# Patient Record
Sex: Male | Born: 1950
Health system: Southern US, Community
[De-identification: ages and names within clinical notes are randomized; demographics above are authoritative.]

## PROBLEM LIST (undated history)

## (undated) DIAGNOSIS — T8859XA Other complications of anesthesia, initial encounter: Secondary | ICD-10-CM

## (undated) DIAGNOSIS — I10 Essential (primary) hypertension: Secondary | ICD-10-CM

## (undated) DIAGNOSIS — Z9109 Other allergy status, other than to drugs and biological substances: Secondary | ICD-10-CM

## (undated) DIAGNOSIS — J189 Pneumonia, unspecified organism: Secondary | ICD-10-CM

## (undated) DIAGNOSIS — IMO0002 Reserved for concepts with insufficient information to code with codable children: Secondary | ICD-10-CM

## (undated) DIAGNOSIS — T4145XA Adverse effect of unspecified anesthetic, initial encounter: Secondary | ICD-10-CM

## (undated) DIAGNOSIS — H9319 Tinnitus, unspecified ear: Secondary | ICD-10-CM

## (undated) DIAGNOSIS — R011 Cardiac murmur, unspecified: Secondary | ICD-10-CM

## (undated) DIAGNOSIS — C109 Malignant neoplasm of oropharynx, unspecified: Secondary | ICD-10-CM

## (undated) DIAGNOSIS — Z923 Personal history of irradiation: Secondary | ICD-10-CM

## (undated) DIAGNOSIS — K5790 Diverticulosis of intestine, part unspecified, without perforation or abscess without bleeding: Secondary | ICD-10-CM

## (undated) DIAGNOSIS — Z9221 Personal history of antineoplastic chemotherapy: Secondary | ICD-10-CM

## (undated) HISTORY — PX: COLONOSCOPY: SHX174

## (undated) HISTORY — PX: LUMBAR DISC SURGERY: SHX700

## (undated) HISTORY — DX: Reserved for concepts with insufficient information to code with codable children: IMO0002

## (undated) HISTORY — PX: TONSILLECTOMY: SUR1361

## (undated) HISTORY — DX: Diverticulosis of intestine, part unspecified, without perforation or abscess without bleeding: K57.90

## (undated) HISTORY — DX: Malignant neoplasm of oropharynx, unspecified: C10.9

## (undated) HISTORY — DX: Other allergy status, other than to drugs and biological substances: Z91.09

## (undated) HISTORY — PX: APPENDECTOMY: SHX54

## (undated) HISTORY — DX: Personal history of irradiation: Z92.3

---
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
## 1998-02-23 ENCOUNTER — Ambulatory Visit (HOSPITAL_COMMUNITY): Admission: RE | Admit: 1998-02-23 | Discharge: 1998-02-23 | Payer: Self-pay | Admitting: *Deleted

## 2005-06-07 ENCOUNTER — Emergency Department (HOSPITAL_COMMUNITY): Admission: EM | Admit: 2005-06-07 | Discharge: 2005-06-07 | Payer: Self-pay | Admitting: Emergency Medicine

## 2005-06-24 ENCOUNTER — Emergency Department (HOSPITAL_COMMUNITY): Admission: EM | Admit: 2005-06-24 | Discharge: 2005-06-24 | Payer: Self-pay | Admitting: Emergency Medicine

## 2006-11-18 DIAGNOSIS — K5790 Diverticulosis of intestine, part unspecified, without perforation or abscess without bleeding: Secondary | ICD-10-CM

## 2006-11-18 HISTORY — DX: Diverticulosis of intestine, part unspecified, without perforation or abscess without bleeding: K57.90

## 2007-08-30 ENCOUNTER — Emergency Department (HOSPITAL_COMMUNITY): Admission: EM | Admit: 2007-08-30 | Discharge: 2007-08-30 | Payer: Self-pay | Admitting: Family Medicine

## 2007-12-24 ENCOUNTER — Ambulatory Visit: Payer: Self-pay | Admitting: Gastroenterology

## 2008-01-07 ENCOUNTER — Ambulatory Visit: Payer: Self-pay | Admitting: Gastroenterology

## 2008-12-07 ENCOUNTER — Emergency Department (HOSPITAL_COMMUNITY): Admission: EM | Admit: 2008-12-07 | Discharge: 2008-12-07 | Payer: Self-pay | Admitting: Emergency Medicine

## 2009-11-24 ENCOUNTER — Ambulatory Visit: Payer: Self-pay | Admitting: Family Medicine

## 2010-02-19 ENCOUNTER — Ambulatory Visit: Payer: Self-pay | Admitting: Family Medicine

## 2010-08-15 ENCOUNTER — Ambulatory Visit: Payer: Self-pay | Admitting: Family Medicine

## 2010-09-18 ENCOUNTER — Ambulatory Visit: Payer: Self-pay | Admitting: Family Medicine

## 2010-09-18 ENCOUNTER — Ambulatory Visit (HOSPITAL_COMMUNITY): Admission: RE | Admit: 2010-09-18 | Discharge: 2010-09-18 | Payer: Self-pay | Admitting: Family Medicine

## 2010-09-18 DIAGNOSIS — Z85819 Personal history of malignant neoplasm of unspecified site of lip, oral cavity, and pharynx: Secondary | ICD-10-CM | POA: Insufficient documentation

## 2010-09-18 DIAGNOSIS — C109 Malignant neoplasm of oropharynx, unspecified: Secondary | ICD-10-CM

## 2010-09-18 HISTORY — DX: Malignant neoplasm of oropharynx, unspecified: C10.9

## 2010-09-25 ENCOUNTER — Encounter: Admission: RE | Admit: 2010-09-25 | Discharge: 2010-09-25 | Payer: Self-pay | Admitting: Otolaryngology

## 2010-09-25 ENCOUNTER — Other Ambulatory Visit: Admission: RE | Admit: 2010-09-25 | Discharge: 2010-09-25 | Payer: Self-pay | Admitting: Otolaryngology

## 2010-09-28 ENCOUNTER — Ambulatory Visit (HOSPITAL_COMMUNITY): Admission: RE | Admit: 2010-09-28 | Discharge: 2010-09-28 | Payer: Self-pay | Admitting: Otolaryngology

## 2010-10-01 ENCOUNTER — Ambulatory Visit (HOSPITAL_BASED_OUTPATIENT_CLINIC_OR_DEPARTMENT_OTHER): Admission: RE | Admit: 2010-10-01 | Discharge: 2010-10-01 | Payer: Self-pay | Admitting: Otolaryngology

## 2010-10-03 ENCOUNTER — Ambulatory Visit: Payer: Self-pay | Admitting: Oncology

## 2010-10-10 LAB — COMPREHENSIVE METABOLIC PANEL
ALT: 18 U/L (ref 0–53)
AST: 21 U/L (ref 0–37)
Albumin: 4 g/dL (ref 3.5–5.2)
Alkaline Phosphatase: 110 U/L (ref 39–117)
BUN: 9 mg/dL (ref 6–23)
CO2: 28 mEq/L (ref 19–32)
Calcium: 9.3 mg/dL (ref 8.4–10.5)
Chloride: 102 mEq/L (ref 96–112)
Creatinine, Ser: 0.95 mg/dL (ref 0.40–1.50)
Glucose, Bld: 117 mg/dL — ABNORMAL HIGH (ref 70–99)
Potassium: 3.9 mEq/L (ref 3.5–5.3)
Sodium: 138 mEq/L (ref 135–145)
Total Bilirubin: 0.6 mg/dL (ref 0.3–1.2)
Total Protein: 7.6 g/dL (ref 6.0–8.3)

## 2010-10-10 LAB — CBC WITH DIFFERENTIAL/PLATELET
BASO%: 0.3 % (ref 0.0–2.0)
Basophils Absolute: 0 10*3/uL (ref 0.0–0.1)
EOS%: 1.9 % (ref 0.0–7.0)
Eosinophils Absolute: 0.1 10*3/uL (ref 0.0–0.5)
HCT: 41.5 % (ref 38.4–49.9)
HGB: 14.1 g/dL (ref 13.0–17.1)
LYMPH%: 22.1 % (ref 14.0–49.0)
MCH: 29.1 pg (ref 27.2–33.4)
MCHC: 34 g/dL (ref 32.0–36.0)
MCV: 85.6 fL (ref 79.3–98.0)
MONO#: 0.5 10*3/uL (ref 0.1–0.9)
MONO%: 9.1 % (ref 0.0–14.0)
NEUT#: 4 10*3/uL (ref 1.5–6.5)
NEUT%: 66.6 % (ref 39.0–75.0)
Platelets: 238 10*3/uL (ref 140–400)
RBC: 4.84 10*6/uL (ref 4.20–5.82)
RDW: 13.8 % (ref 11.0–14.6)
WBC: 6 10*3/uL (ref 4.0–10.3)
lymph#: 1.3 10*3/uL (ref 0.9–3.3)

## 2010-10-17 ENCOUNTER — Encounter: Admission: RE | Admit: 2010-10-17 | Discharge: 2010-10-17 | Payer: Self-pay | Admitting: Dentistry

## 2010-10-17 ENCOUNTER — Ambulatory Visit: Payer: Self-pay | Admitting: Dentistry

## 2010-10-23 ENCOUNTER — Ambulatory Visit
Admission: RE | Admit: 2010-10-23 | Discharge: 2010-12-18 | Payer: Self-pay | Source: Home / Self Care | Attending: Radiation Oncology | Admitting: Radiation Oncology

## 2010-10-26 ENCOUNTER — Encounter
Admission: RE | Admit: 2010-10-26 | Discharge: 2010-11-15 | Payer: Self-pay | Source: Home / Self Care | Attending: Oncology | Admitting: Oncology

## 2010-10-31 LAB — COMPREHENSIVE METABOLIC PANEL
ALT: 23 U/L (ref 0–53)
AST: 24 U/L (ref 0–37)
Albumin: 4 g/dL (ref 3.5–5.2)
Alkaline Phosphatase: 130 U/L — ABNORMAL HIGH (ref 39–117)
BUN: 12 mg/dL (ref 6–23)
CO2: 29 mEq/L (ref 19–32)
Calcium: 9.7 mg/dL (ref 8.4–10.5)
Chloride: 100 mEq/L (ref 96–112)
Creatinine, Ser: 0.87 mg/dL (ref 0.40–1.50)
Glucose, Bld: 115 mg/dL — ABNORMAL HIGH (ref 70–99)
Potassium: 4.5 mEq/L (ref 3.5–5.3)
Sodium: 138 mEq/L (ref 135–145)
Total Bilirubin: 0.8 mg/dL (ref 0.3–1.2)
Total Protein: 7.2 g/dL (ref 6.0–8.3)

## 2010-10-31 LAB — CBC WITH DIFFERENTIAL/PLATELET
BASO%: 0.2 % (ref 0.0–2.0)
Basophils Absolute: 0 10*3/uL (ref 0.0–0.1)
EOS%: 1.6 % (ref 0.0–7.0)
Eosinophils Absolute: 0.1 10*3/uL (ref 0.0–0.5)
HCT: 42.1 % (ref 38.4–49.9)
HGB: 14.5 g/dL (ref 13.0–17.1)
LYMPH%: 22.2 % (ref 14.0–49.0)
MCH: 29.5 pg (ref 27.2–33.4)
MCHC: 34.5 g/dL (ref 32.0–36.0)
MCV: 85.7 fL (ref 79.3–98.0)
MONO#: 0.5 10*3/uL (ref 0.1–0.9)
MONO%: 8 % (ref 0.0–14.0)
NEUT#: 4.2 10*3/uL (ref 1.5–6.5)
NEUT%: 68 % (ref 39.0–75.0)
Platelets: 226 10*3/uL (ref 140–400)
RBC: 4.91 10*6/uL (ref 4.20–5.82)
RDW: 13.7 % (ref 11.0–14.6)
WBC: 6.1 10*3/uL (ref 4.0–10.3)
lymph#: 1.4 10*3/uL (ref 0.9–3.3)

## 2010-11-02 ENCOUNTER — Ambulatory Visit: Payer: Self-pay | Admitting: Oncology

## 2010-11-13 ENCOUNTER — Ambulatory Visit (HOSPITAL_COMMUNITY)
Admission: RE | Admit: 2010-11-13 | Discharge: 2010-11-13 | Payer: Self-pay | Source: Home / Self Care | Attending: Radiation Oncology | Admitting: Radiation Oncology

## 2010-11-15 ENCOUNTER — Encounter
Admission: RE | Admit: 2010-11-15 | Discharge: 2010-12-18 | Payer: Self-pay | Source: Home / Self Care | Attending: Oncology | Admitting: Oncology

## 2010-11-15 LAB — BASIC METABOLIC PANEL
BUN: 27 mg/dL — ABNORMAL HIGH (ref 6–23)
CO2: 25 mEq/L (ref 19–32)
Calcium: 9.4 mg/dL (ref 8.4–10.5)
Chloride: 85 mEq/L — ABNORMAL LOW (ref 96–112)
Creatinine, Ser: 1.51 mg/dL — ABNORMAL HIGH (ref 0.40–1.50)
Glucose, Bld: 105 mg/dL — ABNORMAL HIGH (ref 70–99)
Potassium: 4.6 mEq/L (ref 3.5–5.3)
Sodium: 123 mEq/L — ABNORMAL LOW (ref 135–145)

## 2010-11-15 LAB — MAGNESIUM: Magnesium: 1.9 mg/dL (ref 1.5–2.5)

## 2010-11-26 LAB — CBC WITH DIFFERENTIAL/PLATELET
BASO%: 0.3 % (ref 0.0–2.0)
Basophils Absolute: 0 10*3/uL (ref 0.0–0.1)
EOS%: 2.2 % (ref 0.0–7.0)
Eosinophils Absolute: 0 10*3/uL (ref 0.0–0.5)
HCT: 35.9 % — ABNORMAL LOW (ref 38.4–49.9)
HGB: 12.5 g/dL — ABNORMAL LOW (ref 13.0–17.1)
LYMPH%: 16.4 % (ref 14.0–49.0)
MCH: 29.2 pg (ref 27.2–33.4)
MCHC: 34.8 g/dL (ref 32.0–36.0)
MCV: 84 fL (ref 79.3–98.0)
MONO#: 0.3 10*3/uL (ref 0.1–0.9)
MONO%: 18.9 % — ABNORMAL HIGH (ref 0.0–14.0)
NEUT#: 1.1 10*3/uL — ABNORMAL LOW (ref 1.5–6.5)
NEUT%: 62.2 % (ref 39.0–75.0)
Platelets: 240 10*3/uL (ref 140–400)
RBC: 4.28 10*6/uL (ref 4.20–5.82)
RDW: 14.5 % (ref 11.0–14.6)
WBC: 1.8 10*3/uL — ABNORMAL LOW (ref 4.0–10.3)
lymph#: 0.3 10*3/uL — ABNORMAL LOW (ref 0.9–3.3)

## 2010-11-26 LAB — COMPREHENSIVE METABOLIC PANEL
ALT: 24 U/L (ref 0–53)
AST: 23 U/L (ref 0–37)
Albumin: 3.7 g/dL (ref 3.5–5.2)
Alkaline Phosphatase: 114 U/L (ref 39–117)
BUN: 12 mg/dL (ref 6–23)
CO2: 25 mEq/L (ref 19–32)
Calcium: 9.4 mg/dL (ref 8.4–10.5)
Chloride: 97 mEq/L (ref 96–112)
Creatinine, Ser: 1.13 mg/dL (ref 0.40–1.50)
Glucose, Bld: 97 mg/dL (ref 70–99)
Potassium: 4.8 mEq/L (ref 3.5–5.3)
Sodium: 132 mEq/L — ABNORMAL LOW (ref 135–145)
Total Bilirubin: 0.4 mg/dL (ref 0.3–1.2)
Total Protein: 6.8 g/dL (ref 6.0–8.3)

## 2010-11-26 LAB — MAGNESIUM: Magnesium: 2 mg/dL (ref 1.5–2.5)

## 2010-11-28 ENCOUNTER — Ambulatory Visit
Admission: RE | Admit: 2010-11-28 | Discharge: 2010-11-28 | Payer: Self-pay | Source: Home / Self Care | Attending: Family Medicine | Admitting: Family Medicine

## 2010-11-29 LAB — CBC WITH DIFFERENTIAL/PLATELET
BASO%: 0 % (ref 0.0–2.0)
Basophils Absolute: 0 10*3/uL (ref 0.0–0.1)
EOS%: 0 % (ref 0.0–7.0)
Eosinophils Absolute: 0 10*3/uL (ref 0.0–0.5)
HCT: 38.3 % — ABNORMAL LOW (ref 38.4–49.9)
HGB: 13.3 g/dL (ref 13.0–17.1)
LYMPH%: 3.3 % — ABNORMAL LOW (ref 14.0–49.0)
MCH: 29.4 pg (ref 27.2–33.4)
MCHC: 34.9 g/dL (ref 32.0–36.0)
MCV: 84.3 fL (ref 79.3–98.0)
MONO#: 0.6 10*3/uL (ref 0.1–0.9)
MONO%: 12.3 % (ref 0.0–14.0)
NEUT#: 3.9 10*3/uL (ref 1.5–6.5)
NEUT%: 84.4 % — ABNORMAL HIGH (ref 39.0–75.0)
Platelets: 222 10*3/uL (ref 140–400)
RBC: 4.54 10*6/uL (ref 4.20–5.82)
RDW: 14 % (ref 11.0–14.6)
WBC: 4.6 10*3/uL (ref 4.0–10.3)
lymph#: 0.2 10*3/uL — ABNORMAL LOW (ref 0.9–3.3)

## 2010-11-29 LAB — MAGNESIUM: Magnesium: 2 mg/dL (ref 1.5–2.5)

## 2010-11-29 LAB — COMPREHENSIVE METABOLIC PANEL
ALT: 86 U/L — ABNORMAL HIGH (ref 0–53)
AST: 54 U/L — ABNORMAL HIGH (ref 0–37)
Albumin: 3.9 g/dL (ref 3.5–5.2)
Alkaline Phosphatase: 102 U/L (ref 39–117)
BUN: 35 mg/dL — ABNORMAL HIGH (ref 6–23)
CO2: 25 mEq/L (ref 19–32)
Calcium: 8.9 mg/dL (ref 8.4–10.5)
Chloride: 86 mEq/L — ABNORMAL LOW (ref 96–112)
Creatinine, Ser: 1.65 mg/dL — ABNORMAL HIGH (ref 0.40–1.50)
Glucose, Bld: 116 mg/dL — ABNORMAL HIGH (ref 70–99)
Potassium: 4.9 mEq/L (ref 3.5–5.3)
Sodium: 124 mEq/L — ABNORMAL LOW (ref 135–145)
Total Bilirubin: 0.5 mg/dL (ref 0.3–1.2)
Total Protein: 7.8 g/dL (ref 6.0–8.3)

## 2010-12-03 ENCOUNTER — Ambulatory Visit (HOSPITAL_BASED_OUTPATIENT_CLINIC_OR_DEPARTMENT_OTHER): Payer: 59 | Admitting: Oncology

## 2010-12-07 LAB — BASIC METABOLIC PANEL
BUN: 24 mg/dL — ABNORMAL HIGH (ref 6–23)
CO2: 30 mEq/L (ref 19–32)
Calcium: 9.5 mg/dL (ref 8.4–10.5)
Chloride: 82 mEq/L — ABNORMAL LOW (ref 96–112)
Creatinine, Ser: 1.39 mg/dL (ref 0.40–1.50)
Glucose, Bld: 97 mg/dL (ref 70–99)
Potassium: 4.8 mEq/L (ref 3.5–5.3)
Sodium: 122 mEq/L — ABNORMAL LOW (ref 135–145)

## 2010-12-07 LAB — MAGNESIUM: Magnesium: 2 mg/dL (ref 1.5–2.5)

## 2010-12-13 LAB — COMPREHENSIVE METABOLIC PANEL
ALT: 63 U/L — ABNORMAL HIGH (ref 0–53)
AST: 23 U/L (ref 0–37)
Albumin: 3.6 g/dL (ref 3.5–5.2)
Alkaline Phosphatase: 116 U/L (ref 39–117)
BUN: 23 mg/dL (ref 6–23)
CO2: 29 mEq/L (ref 19–32)
Calcium: 8.6 mg/dL (ref 8.4–10.5)
Chloride: 86 mEq/L — ABNORMAL LOW (ref 96–112)
Creatinine, Ser: 1.49 mg/dL (ref 0.40–1.50)
Glucose, Bld: 103 mg/dL — ABNORMAL HIGH (ref 70–99)
Potassium: 5 mEq/L (ref 3.5–5.3)
Sodium: 124 mEq/L — ABNORMAL LOW (ref 135–145)
Total Bilirubin: 0.4 mg/dL (ref 0.3–1.2)
Total Protein: 6.5 g/dL (ref 6.0–8.3)

## 2010-12-13 LAB — CBC WITH DIFFERENTIAL/PLATELET
BASO%: 1.2 % (ref 0.0–2.0)
Basophils Absolute: 0 10*3/uL (ref 0.0–0.1)
EOS%: 0 % (ref 0.0–7.0)
Eosinophils Absolute: 0 10*3/uL (ref 0.0–0.5)
HCT: 27.9 % — ABNORMAL LOW (ref 38.4–49.9)
HGB: 9.9 g/dL — ABNORMAL LOW (ref 13.0–17.1)
LYMPH%: 17.4 % (ref 14.0–49.0)
MCH: 28.6 pg (ref 27.2–33.4)
MCHC: 35.5 g/dL (ref 32.0–36.0)
MCV: 80.6 fL (ref 79.3–98.0)
MONO#: 0.1 10*3/uL (ref 0.1–0.9)
MONO%: 11.6 % (ref 0.0–14.0)
NEUT#: 0.6 10*3/uL — ABNORMAL LOW (ref 1.5–6.5)
NEUT%: 69.8 % (ref 39.0–75.0)
Platelets: 66 10*3/uL — ABNORMAL LOW (ref 140–400)
RBC: 3.46 10*6/uL — ABNORMAL LOW (ref 4.20–5.82)
RDW: 14.3 % (ref 11.0–14.6)
WBC: 0.9 10*3/uL — CL (ref 4.0–10.3)
lymph#: 0.2 10*3/uL — ABNORMAL LOW (ref 0.9–3.3)
nRBC: 0 % (ref 0–0)

## 2010-12-13 LAB — URINALYSIS, MICROSCOPIC - CHCC
Bilirubin (Urine): NEGATIVE
Glucose: NEGATIVE g/dL
Ketones: NEGATIVE mg/dL
Leukocyte Esterase: NEGATIVE
Nitrite: NEGATIVE
Protein: <30 mg/dL
Specific Gravity, Urine: 1.015 (ref 1.003–1.035)
pH: 6.5 (ref 4.6–8.0)

## 2010-12-13 LAB — TECHNOLOGIST REVIEW

## 2010-12-14 ENCOUNTER — Inpatient Hospital Stay (HOSPITAL_COMMUNITY)
Admission: EM | Admit: 2010-12-14 | Discharge: 2010-12-18 | Disposition: A | Discharge status: Home / Self Care | Payer: 59 | Source: Home / Self Care | Attending: Internal Medicine | Admitting: Internal Medicine

## 2010-12-14 DIAGNOSIS — D709 Neutropenia, unspecified: Secondary | ICD-10-CM

## 2010-12-14 DIAGNOSIS — C01 Malignant neoplasm of base of tongue: Secondary | ICD-10-CM

## 2010-12-14 DIAGNOSIS — N289 Disorder of kidney and ureter, unspecified: Secondary | ICD-10-CM

## 2010-12-14 DIAGNOSIS — R5081 Fever presenting with conditions classified elsewhere: Secondary | ICD-10-CM

## 2010-12-14 LAB — URINALYSIS, ROUTINE W REFLEX MICROSCOPIC
Bilirubin Urine: NEGATIVE
Hgb urine dipstick: NEGATIVE
Ketones, ur: NEGATIVE mg/dL
Nitrite: NEGATIVE
Protein, ur: NEGATIVE mg/dL
Specific Gravity, Urine: 1.016 (ref 1.005–1.030)
Urine Glucose, Fasting: NEGATIVE mg/dL
Urobilinogen, UA: 0.2 mg/dL (ref 0.0–1.0)
pH: 6 (ref 5.0–8.0)

## 2010-12-14 LAB — URINE CULTURE

## 2010-12-14 LAB — DIFFERENTIAL
Band Neutrophils: 0 % (ref 0–10)
Basophils Absolute: 0 10*3/uL (ref 0.0–0.1)
Basophils Relative: 0 % (ref 0–1)
Eosinophils Absolute: 0 10*3/uL (ref 0.0–0.7)
Eosinophils Relative: 0 % (ref 0–5)
Lymphocytes Relative: 0 % — ABNORMAL LOW (ref 12–46)
Lymphs Abs: 0 10*3/uL — ABNORMAL LOW (ref 0.7–4.0)
Monocytes Absolute: 0 10*3/uL — ABNORMAL LOW (ref 0.1–1.0)
Monocytes Relative: 0 % — ABNORMAL LOW (ref 3–12)
Neutrophils Relative %: 0 % — ABNORMAL LOW (ref 43–77)
nRBC: 0 /100 WBC

## 2010-12-14 LAB — CBC
HCT: 25 % — ABNORMAL LOW (ref 39.0–52.0)
Hemoglobin: 9 g/dL — ABNORMAL LOW (ref 13.0–17.0)
MCH: 28.8 pg (ref 26.0–34.0)
MCHC: 36 g/dL (ref 30.0–36.0)
MCV: 80.1 fL (ref 78.0–100.0)
Platelets: 80 10*3/uL — ABNORMAL LOW (ref 150–400)
RBC: 3.12 MIL/uL — ABNORMAL LOW (ref 4.22–5.81)
RDW: 14.1 % (ref 11.5–15.5)
WBC: 0.6 10*3/uL — CL (ref 4.0–10.5)

## 2010-12-14 LAB — BASIC METABOLIC PANEL
BUN: 25 mg/dL — ABNORMAL HIGH (ref 6–23)
CO2: 27 mEq/L (ref 19–32)
Calcium: 8.5 mg/dL (ref 8.4–10.5)
Chloride: 85 mEq/L — ABNORMAL LOW (ref 96–112)
Creatinine, Ser: 1.58 mg/dL — ABNORMAL HIGH (ref 0.4–1.5)
GFR calc Af Amer: 55 mL/min — ABNORMAL LOW (ref >60)
GFR calc non Af Amer: 45 mL/min — ABNORMAL LOW (ref >60)
Glucose, Bld: 126 mg/dL — ABNORMAL HIGH (ref 70–99)
Potassium: 5.1 mEq/L (ref 3.5–5.1)
Sodium: 121 mEq/L — ABNORMAL LOW (ref 135–145)

## 2010-12-14 LAB — ABO/RH: ABO/RH(D): O POS

## 2010-12-14 LAB — PROCALCITONIN: Procalcitonin: 0.25 ng/mL

## 2010-12-14 LAB — LACTIC ACID, PLASMA: Lactic Acid, Venous: 0.9 mmol/L (ref 0.5–2.2)

## 2010-12-15 LAB — BASIC METABOLIC PANEL
BUN: 18 mg/dL (ref 6–23)
CO2: 28 mEq/L (ref 19–32)
Calcium: 8.3 mg/dL — ABNORMAL LOW (ref 8.4–10.5)
Chloride: 91 mEq/L — ABNORMAL LOW (ref 96–112)
Creatinine, Ser: 1.38 mg/dL (ref 0.4–1.5)
GFR calc Af Amer: >60 mL/min (ref >60)
GFR calc non Af Amer: 53 mL/min — ABNORMAL LOW (ref >60)
Glucose, Bld: 125 mg/dL — ABNORMAL HIGH (ref 70–99)
Potassium: 5.2 mEq/L — ABNORMAL HIGH (ref 3.5–5.1)
Sodium: 125 mEq/L — ABNORMAL LOW (ref 135–145)

## 2010-12-15 LAB — CBC
HCT: 22.7 % — ABNORMAL LOW (ref 39.0–52.0)
Hemoglobin: 8 g/dL — ABNORMAL LOW (ref 13.0–17.0)
MCH: 28.8 pg (ref 26.0–34.0)
MCHC: 35.2 g/dL (ref 30.0–36.0)
MCV: 81.7 fL (ref 78.0–100.0)
Platelets: 74 10*3/uL — ABNORMAL LOW (ref 150–400)
RBC: 2.78 MIL/uL — ABNORMAL LOW (ref 4.22–5.81)
RDW: 14.2 % (ref 11.5–15.5)
WBC: 0.7 10*3/uL — CL (ref 4.0–10.5)

## 2010-12-15 LAB — URINE CULTURE
Colony Count: 2000
Culture  Setup Time: 201201272122

## 2010-12-16 LAB — CBC
HCT: 22.4 % — ABNORMAL LOW (ref 39.0–52.0)
Hemoglobin: 7.8 g/dL — ABNORMAL LOW (ref 13.0–17.0)
MCH: 28.4 pg (ref 26.0–34.0)
MCHC: 34.8 g/dL (ref 30.0–36.0)
MCV: 81.5 fL (ref 78.0–100.0)
Platelets: 83 10*3/uL — ABNORMAL LOW (ref 150–400)
RBC: 2.75 MIL/uL — ABNORMAL LOW (ref 4.22–5.81)
RDW: 14.4 % (ref 11.5–15.5)
WBC: 1.2 10*3/uL — CL (ref 4.0–10.5)

## 2010-12-16 LAB — DIFFERENTIAL
Basophils Absolute: 0 10*3/uL (ref 0.0–0.1)
Basophils Relative: 1 % (ref 0–1)
Eosinophils Absolute: 0 10*3/uL (ref 0.0–0.7)
Eosinophils Relative: 2 % (ref 0–5)
Lymphocytes Relative: 11 % — ABNORMAL LOW (ref 12–46)
Lymphs Abs: 0.1 10*3/uL — ABNORMAL LOW (ref 0.7–4.0)
Monocytes Absolute: 0.3 10*3/uL (ref 0.1–1.0)
Monocytes Relative: 23 % — ABNORMAL HIGH (ref 3–12)
Neutro Abs: 0.8 10*3/uL — ABNORMAL LOW (ref 1.7–7.7)
Neutrophils Relative %: 63 % (ref 43–77)

## 2010-12-16 LAB — BASIC METABOLIC PANEL
BUN: 12 mg/dL (ref 6–23)
CO2: 28 mEq/L (ref 19–32)
Calcium: 8.5 mg/dL (ref 8.4–10.5)
Chloride: 94 mEq/L — ABNORMAL LOW (ref 96–112)
Creatinine, Ser: 1.09 mg/dL (ref 0.4–1.5)
GFR calc Af Amer: >60 mL/min (ref >60)
GFR calc non Af Amer: >60 mL/min (ref >60)
Glucose, Bld: 99 mg/dL (ref 70–99)
Potassium: 5 mEq/L (ref 3.5–5.1)
Sodium: 128 mEq/L — ABNORMAL LOW (ref 135–145)

## 2010-12-17 LAB — CBC
HCT: 25.6 % — ABNORMAL LOW (ref 39.0–52.0)
Hemoglobin: 9 g/dL — ABNORMAL LOW (ref 13.0–17.0)
MCH: 28.7 pg (ref 26.0–34.0)
MCHC: 35.2 g/dL (ref 30.0–36.0)
MCV: 81.5 fL (ref 78.0–100.0)
Platelets: 107 10*3/uL — ABNORMAL LOW (ref 150–400)
RBC: 3.14 MIL/uL — ABNORMAL LOW (ref 4.22–5.81)
RDW: 14.6 % (ref 11.5–15.5)
WBC: 4.3 10*3/uL (ref 4.0–10.5)

## 2010-12-17 LAB — COMPREHENSIVE METABOLIC PANEL
ALT: 32 U/L (ref 0–53)
AST: 19 U/L (ref 0–37)
Albumin: 2.4 g/dL — ABNORMAL LOW (ref 3.5–5.2)
Alkaline Phosphatase: 81 U/L (ref 39–117)
BUN: 8 mg/dL (ref 6–23)
CO2: 26 mEq/L (ref 19–32)
Calcium: 8.5 mg/dL (ref 8.4–10.5)
Chloride: 96 mEq/L (ref 96–112)
Creatinine, Ser: 1.1 mg/dL (ref 0.4–1.5)
GFR calc Af Amer: >60 mL/min (ref >60)
GFR calc non Af Amer: >60 mL/min (ref >60)
Glucose, Bld: 124 mg/dL — ABNORMAL HIGH (ref 70–99)
Potassium: 4.8 mEq/L (ref 3.5–5.1)
Sodium: 129 mEq/L — ABNORMAL LOW (ref 135–145)
Total Bilirubin: 0.5 mg/dL (ref 0.3–1.2)
Total Protein: 5.7 g/dL — ABNORMAL LOW (ref 6.0–8.3)

## 2010-12-17 LAB — DIFFERENTIAL
Basophils Absolute: 0 10*3/uL (ref 0.0–0.1)
Basophils Relative: 1 % (ref 0–1)
Eosinophils Absolute: 0 10*3/uL (ref 0.0–0.7)
Eosinophils Relative: 1 % (ref 0–5)
Lymphocytes Relative: 9 % — ABNORMAL LOW (ref 12–46)
Lymphs Abs: 0.4 10*3/uL — ABNORMAL LOW (ref 0.7–4.0)
Monocytes Absolute: 0.9 10*3/uL (ref 0.1–1.0)
Monocytes Relative: 20 % — ABNORMAL HIGH (ref 3–12)
Neutro Abs: 3 10*3/uL (ref 1.7–7.7)
Neutrophils Relative %: 69 % (ref 43–77)
WBC Morphology: INCREASED

## 2010-12-17 LAB — VANCOMYCIN, TROUGH: Vancomycin Tr: 13.3 ug/mL (ref 10.0–20.0)
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
## 2010-12-18 LAB — BASIC METABOLIC PANEL
BUN: 8 mg/dL (ref 6–23)
CO2: 28 mEq/L (ref 19–32)
Calcium: 8.7 mg/dL (ref 8.4–10.5)
Chloride: 97 mEq/L (ref 96–112)
Creatinine, Ser: 1.03 mg/dL (ref 0.4–1.5)
GFR calc Af Amer: >60 mL/min (ref >60)
GFR calc non Af Amer: >60 mL/min (ref >60)
Glucose, Bld: 140 mg/dL — ABNORMAL HIGH (ref 70–99)
Potassium: 4.3 mEq/L (ref 3.5–5.1)
Sodium: 132 mEq/L — ABNORMAL LOW (ref 135–145)

## 2010-12-18 LAB — DIFFERENTIAL
Basophils Absolute: 0.1 10*3/uL (ref 0.0–0.1)
Basophils Relative: 1 % (ref 0–1)
Eosinophils Absolute: 0.1 10*3/uL (ref 0.0–0.7)
Eosinophils Relative: 1 % (ref 0–5)
Lymphocytes Relative: 6 % — ABNORMAL LOW (ref 12–46)
Lymphs Abs: 0.6 10*3/uL — ABNORMAL LOW (ref 0.7–4.0)
Monocytes Absolute: 1.1 10*3/uL — ABNORMAL HIGH (ref 0.1–1.0)
Monocytes Relative: 12 % (ref 3–12)
Neutro Abs: 7.4 10*3/uL (ref 1.7–7.7)
Neutrophils Relative %: 80 % — ABNORMAL HIGH (ref 43–77)
WBC Morphology: INCREASED

## 2010-12-18 LAB — CBC
HCT: 26 % — ABNORMAL LOW (ref 39.0–52.0)
Hemoglobin: 9.1 g/dL — ABNORMAL LOW (ref 13.0–17.0)
MCH: 28.9 pg (ref 26.0–34.0)
MCHC: 35 g/dL (ref 30.0–36.0)
MCV: 82.5 fL (ref 78.0–100.0)
Platelets: 145 10*3/uL — ABNORMAL LOW (ref 150–400)
RBC: 3.15 MIL/uL — ABNORMAL LOW (ref 4.22–5.81)
RDW: 15.1 % (ref 11.5–15.5)
WBC: 9.3 10*3/uL (ref 4.0–10.5)

## 2010-12-18 LAB — CROSSMATCH
ABO/RH(D): O POS
Antibody Screen: NEGATIVE
Unit division: 0
Unit division: 0

## 2010-12-18 NOTE — Discharge Summary (Signed)
NAME:  [name], [name] NO.:  [id number]  MEDICAL RECORD NO.:  [id number]          PATIENT TYPE:  INP  LOCATION:  1345                         FACILITY:  [facility]  PHYSICIAN:  Andreas Blower, MD       DATE OF BIRTH:  [birth year]  DATE OF ADMISSION:  12/14/2010 DATE OF DISCHARGE:                              DISCHARGE SUMMARY   PRIMARY CARE PHYSICIAN:  Sharlot Gowda, M.D.  ONCOLOGIST:  Jethro Bolus, M.D.  DISCHARGE DIAGNOSES: 1. Febrile neutropenia. 2. Pneumonia. 3. Anemia. 4. Acute renal insufficiency. 5. Hyponatremia. 6. Protein calorie malnutrition. 7. History of squamous cell carcinoma of the base of the tongue. 8. Hypertension.  DISCHARGE MEDICATIONS: 1. Levaquin 750 mg p.o. daily for four more days. 2. Polyethylene glycol 17 g daily as needed for constipation. 3. Biafine 1 application topically three times a day as needed for     radiation dermatitis. 4. Fentanyl patch 25 mcg per hour every 3 days. 5. Lisinopril 10 mg p.o. daily. 6. Lorazepam 0.5 mg p.o. q.6h as needed for anxiety. 7. Prochlorperazine 10 mg every 6 hours as needed for nausea. 8. Tylenol 500 mg p.o. q.6h as needed for fever. 9. Zofran ODT 8 mg p.o. every 8 hours as needed for nausea and     vomiting.  BRIEF ADMITTING HISTORY AND PHYSICAL:  [name] is a 60 year old, Caucasian male with a diagnosis of squamous cell cancer who presented to the ER with fever and was found to be neutropenic.  RADIOLOGY/IMAGING:  The patient had a chest x-ray, which shows a patchy right basilar air space opacity worrisome for pneumonia.  LABORATORY DATA:  CBC shows a white count of 9.3.  Initially, it was 0.6, hemoglobin was 9.1, hematocrit 26.0, platelet count 145. Electrolytes were normal except sodium is 133.  Initial sodium on presentation was 121.  UA was negative for nitrates and leukocytes.  HOSPITAL COURSE BY PROBLEM: 1. Febrile neutropenia.  The patient initially was started on     vancomycin  and cefepime and was thought to be secondary to     pneumonia.  During the course of the hospital stay, had     intermittent low grade fever, but fever curve improved, and his     antibiotics were transitioned to levofloxacin, which he will     continue for four more days to complete an 8-day course of     antibiotics. 2. Pneumonia.  On levofloxacin for four more days to complete a course     of antibiotics. 3. Neutropenia.  The patient was given Neupogen to improve his a white     count.  He received two doses of Neupogen.  Absolute neutrophil     count was greater than 1000. 4. Acute renal insufficiency improved with hydration.     Hydrochlorothiazide has been discontinued from his regimen. 5. Hyponatremia, may be secondary to chemotherapy and nausea.  I will     discontinue hydrochlorothiazide as it can contribute to     hyponatremia. 6. Protein calorie malnutrition.  The patient had been on tube feeds     prior to hospitalization and was continued on  tube feeds during the     course of hospital stay. 7. Hypertension, stable.  Initially his antihypertensive medications     were held.  The patient will have his hydrochlorothiazide     discontinued and will continue only on lisinopril. 8. History of squamous cell carcinoma of the base of the tongue.  For     further management as per Dr. Gaylyn Rong, continue radiation treatment as     previously scheduled.  So far the patient has received 28 of his 35     treatments.   Andreas Blower, MD   SR/MEDQ  D:  12/18/2010  T:  12/18/2010  Job:  [id number]  Electronically Signed by Wardell Heath Takeysha Bonk  on 12/18/2010 11:15:32 PM

## 2010-12-19 ENCOUNTER — Ambulatory Visit: Payer: 59 | Attending: Radiation Oncology | Admitting: Radiation Oncology

## 2010-12-19 DIAGNOSIS — C01 Malignant neoplasm of base of tongue: Secondary | ICD-10-CM | POA: Insufficient documentation

## 2010-12-19 DIAGNOSIS — E46 Unspecified protein-calorie malnutrition: Secondary | ICD-10-CM | POA: Insufficient documentation

## 2010-12-19 DIAGNOSIS — R112 Nausea with vomiting, unspecified: Secondary | ICD-10-CM | POA: Insufficient documentation

## 2010-12-19 DIAGNOSIS — Z510 Encounter for antineoplastic radiation therapy: Secondary | ICD-10-CM | POA: Insufficient documentation

## 2010-12-19 DIAGNOSIS — E860 Dehydration: Secondary | ICD-10-CM | POA: Insufficient documentation

## 2010-12-19 DIAGNOSIS — L538 Other specified erythematous conditions: Secondary | ICD-10-CM | POA: Insufficient documentation

## 2010-12-19 LAB — CULTURE, BLOOD (SINGLE)

## 2010-12-20 LAB — CULTURE, BLOOD (ROUTINE X 2)
Culture  Setup Time: 201201272113
Culture  Setup Time: 201201272113
Culture: NO GROWTH
Culture: NO GROWTH

## 2010-12-21 ENCOUNTER — Encounter: Payer: 59 | Admitting: Oncology

## 2010-12-21 ENCOUNTER — Ambulatory Visit: Payer: 59 | Attending: Oncology

## 2010-12-21 ENCOUNTER — Encounter: Admit: 2010-12-21 | Payer: Self-pay | Admitting: Oncology

## 2010-12-21 DIAGNOSIS — R1310 Dysphagia, unspecified: Secondary | ICD-10-CM | POA: Insufficient documentation

## 2010-12-21 DIAGNOSIS — E46 Unspecified protein-calorie malnutrition: Secondary | ICD-10-CM

## 2010-12-21 DIAGNOSIS — E860 Dehydration: Secondary | ICD-10-CM

## 2010-12-21 DIAGNOSIS — K121 Other forms of stomatitis: Secondary | ICD-10-CM

## 2010-12-21 DIAGNOSIS — C01 Malignant neoplasm of base of tongue: Secondary | ICD-10-CM

## 2010-12-21 DIAGNOSIS — IMO0001 Reserved for inherently not codable concepts without codable children: Secondary | ICD-10-CM | POA: Insufficient documentation

## 2010-12-21 LAB — COMPREHENSIVE METABOLIC PANEL
ALT: 32 U/L (ref 0–53)
AST: 22 U/L (ref 0–37)
Albumin: 3.4 g/dL — ABNORMAL LOW (ref 3.5–5.2)
Alkaline Phosphatase: 106 U/L (ref 39–117)
BUN: 16 mg/dL (ref 6–23)
CO2: 30 mEq/L (ref 19–32)
Calcium: 9.6 mg/dL (ref 8.4–10.5)
Chloride: 92 mEq/L — ABNORMAL LOW (ref 96–112)
Creatinine, Ser: 1.23 mg/dL (ref 0.40–1.50)
Glucose, Bld: 94 mg/dL (ref 70–99)
Potassium: 5 mEq/L (ref 3.5–5.3)
Sodium: 131 mEq/L — ABNORMAL LOW (ref 135–145)
Total Bilirubin: 0.4 mg/dL (ref 0.3–1.2)
Total Protein: 7.2 g/dL (ref 6.0–8.3)

## 2010-12-21 LAB — MANUAL DIFFERENTIAL
ALC: 0.4 10*3/uL — ABNORMAL LOW (ref 0.9–3.3)
ANC (CHCC manual diff): 6.6 10*3/uL — ABNORMAL HIGH (ref 1.5–6.5)
Band Neutrophils: 14 % — ABNORMAL HIGH (ref 0–10)
Basophil: 0 % (ref 0–2)
Blasts: 0 % (ref 0–0)
EOS: 0 % (ref 0–7)
LYMPH: 5 % — ABNORMAL LOW (ref 14–49)
MONO: 15 % — ABNORMAL HIGH (ref 0–14)
Metamyelocytes: 10 % — ABNORMAL HIGH (ref 0–0)
Myelocytes: 12 % — ABNORMAL HIGH (ref 0–0)
Other Cell: 0 % (ref 0–0)
PLT EST: ADEQUATE
PROMYELO: 0 % (ref 0–0)
RBC Comments: NORMAL
SEG: 44 % (ref 38–77)
Variant Lymph: 0 % (ref 0–0)
nRBC: 0 % (ref 0–0)

## 2010-12-21 LAB — CBC WITH DIFFERENTIAL/PLATELET
HCT: 32.3 % — ABNORMAL LOW (ref 38.4–49.9)
HGB: 11.2 g/dL — ABNORMAL LOW (ref 13.0–17.1)
MCH: 29.1 pg (ref 27.2–33.4)
MCHC: 34.6 g/dL (ref 32.0–36.0)
MCV: 84.2 fL (ref 79.3–98.0)
Platelets: 379 10*3/uL (ref 140–400)
RBC: 3.84 10*6/uL — ABNORMAL LOW (ref 4.20–5.82)
RDW: 15.2 % — ABNORMAL HIGH (ref 11.0–14.6)
WBC: 8.2 10*3/uL (ref 4.0–10.3)

## 2010-12-21 LAB — MAGNESIUM: Magnesium: 2.1 mg/dL (ref 1.5–2.5)

## 2010-12-25 ENCOUNTER — Encounter (HOSPITAL_BASED_OUTPATIENT_CLINIC_OR_DEPARTMENT_OTHER): Payer: 59 | Admitting: Oncology

## 2010-12-25 DIAGNOSIS — C01 Malignant neoplasm of base of tongue: Secondary | ICD-10-CM

## 2010-12-25 DIAGNOSIS — K121 Other forms of stomatitis: Secondary | ICD-10-CM

## 2010-12-25 DIAGNOSIS — E46 Unspecified protein-calorie malnutrition: Secondary | ICD-10-CM

## 2010-12-25 NOTE — Consult Note (Signed)
NAME:  Lee Barnes, Lee Barnes              ACCOUNT NO.:  [id number]  MEDICAL RECORD NO.:  [id number]          PATIENT TYPE:  INP  LOCATION:  1345                         FACILITY:  [facility]  PHYSICIAN:  Rose Phi. Myna Hidalgo, M.D. DATE OF BIRTH:  [birth year]  DATE OF CONSULTATION: DATE OF DISCHARGE:                                CONSULTATION   REFERRING PHYSICIAN:  Peggye Pitt, MD  REASON FOR CONSULTATION: 1. Febrile neutropenia secondary to chemo and radiation therapy. 2. 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  HISTORY OF PRESENT ILLNESS:  Lee Barnes is a 60 year old white gentleman.  He has stage IV squamous cell carcinoma at the base of tongue.  He is being followed by Dr. Gaylyn Rong.  He is getting chemo and radiation therapy.  He has had quite a bit of difficulties with toxicity.  He has had a lot of skin breakdown.  He has a feeding tube in.  He is getting chemo once every 3 weeks.  Last chemotherapy was with cisplatin back in early January.  He has had a fever at home.  He has been more weak.  He has a little bit of a cough.  He subsequently was found to have pneumonia.  He is being admitted because of the febrile neutropenia.  There has been no bleeding and he has had no diarrhea.  There has been no changes in his medications.  PAST MEDICAL HISTORY:  Remarkable for: 1. Appendectomy. 2. Knee surgery. 3. Right shoulder surgery.  ALLERGIES:  CODEINE.  MEDICATIONS: 1. Aspirin 81 mg p.o. daily. 2. Cetirizine 10 mg daily. 3. Vicodin (5/325) 1-2 p.o. q.6h. p.r.n. 4. Fentanyl patch 25 mcg to the skin every 3 days. 5. Ativan 0.5-1 mg q.8h. p.r.n. nausea and vomiting. 6. Compazine 10 mg p.o. q.6h. p.r.n. nausea and vomiting.  SOCIAL HISTORY:  Unremarkable.  REVIEW OF SYSTEMS:  As stated in history of a history of present illness.  PHYSICAL EXAM:  GENERAL:  This is a fairly well-developed, well- nourished white gentleman in no obvious  distress. VITAL SIGNS:  Temperature of 101.4, pulse 120, respiratory rate 19, blood pressure 105/65. HEENT:  Head exam shows marked radiation dermatitis on his neck.  Oral mucosa is dry.  Has pharyngeal erythema. NECK:  No obvious adenopathy is noted on his neck. LUNGS:  Decreased bilaterally. CARDIAC:  Tachycardia but regular.  There are no murmurs, rubs, or bruits. ABDOMEN:  Soft with good bowel sounds.  There is no palpable abdominal mass.  A PEG tube was intact.  He has no palpable hepatosplenomegaly. EXTREMITIES:  Shows no clubbing, cyanosis, or edema. NEUROLOGICAL:  Exam shows no focal neurological deficits.  LABORATORY STUDIES:  White count 0.6, hemoglobin 9, hematocrit 25, platelet count 80,000.  Sodium 121, potassium 5.1, BUN 25, creatinine 1.6.  Calcium 8.5.  Chest x-ray shows patchy airspace disease in right lung base.  IMPRESSION:  Lee Barnes is a 60 year old gentleman with squamous cell carcinoma on the base of the tongue.  He has stage IV disease.  He has cervical lymphadenopathy.  He will be admitted.  He will be  treated with vancomycin maximally.  I certainly agree with this.  I think he will benefit from Neupogen.  I am not sure as to why his counts are so low.  Not had chemotherapy now for 3 weeks.  Nutrition will get me report.  We will make sure he gets proper tube feedings through his PEG tube.  He definitely needs to be held off on his treatment for right now.  I do not see that he is going to be in any shape to take any kind of chemotherapy right now.  We will certainly follow along with the hospitalist.  I greatly appreciate the hospitalist help.     Rose Phi. Myna Hidalgo, M.D.     PRE/MEDQ  D:  12/14/2010  T:  12/14/2010  Job:  [id number]  cc:   Peggye Pitt, M.D.  Electronically Signed by Arlan Organ  on 12/25/2010 07:01:10 AM

## 2010-12-31 ENCOUNTER — Ambulatory Visit (INDEPENDENT_AMBULATORY_CARE_PROVIDER_SITE_OTHER): Payer: 59 | Admitting: Family Medicine

## 2010-12-31 DIAGNOSIS — L258 Unspecified contact dermatitis due to other agents: Secondary | ICD-10-CM

## 2011-01-01 ENCOUNTER — Encounter (HOSPITAL_BASED_OUTPATIENT_CLINIC_OR_DEPARTMENT_OTHER): Payer: 59 | Admitting: Oncology

## 2011-01-01 ENCOUNTER — Other Ambulatory Visit: Payer: Self-pay | Admitting: Oncology

## 2011-01-01 DIAGNOSIS — E860 Dehydration: Secondary | ICD-10-CM

## 2011-01-01 DIAGNOSIS — E46 Unspecified protein-calorie malnutrition: Secondary | ICD-10-CM

## 2011-01-01 DIAGNOSIS — C099 Malignant neoplasm of tonsil, unspecified: Secondary | ICD-10-CM

## 2011-01-01 DIAGNOSIS — K121 Other forms of stomatitis: Secondary | ICD-10-CM

## 2011-01-01 LAB — BASIC METABOLIC PANEL
BUN: 27 mg/dL — ABNORMAL HIGH (ref 6–23)
CO2: 30 mEq/L (ref 19–32)
Calcium: 10.2 mg/dL (ref 8.4–10.5)
Chloride: 92 mEq/L — ABNORMAL LOW (ref 96–112)
Creatinine, Ser: 1.36 mg/dL (ref 0.40–1.50)
Glucose, Bld: 111 mg/dL — ABNORMAL HIGH (ref 70–99)
Potassium: 5.5 mEq/L — ABNORMAL HIGH (ref 3.5–5.3)
Sodium: 131 mEq/L — ABNORMAL LOW (ref 135–145)

## 2011-01-01 LAB — MAGNESIUM: Magnesium: 2.4 mg/dL (ref 1.5–2.5)

## 2011-01-02 ENCOUNTER — Encounter (HOSPITAL_BASED_OUTPATIENT_CLINIC_OR_DEPARTMENT_OTHER): Payer: 59 | Admitting: Oncology

## 2011-01-02 ENCOUNTER — Ambulatory Visit: Payer: 59 | Attending: Radiation Oncology | Admitting: Radiation Oncology

## 2011-01-02 DIAGNOSIS — C01 Malignant neoplasm of base of tongue: Secondary | ICD-10-CM

## 2011-01-02 DIAGNOSIS — R112 Nausea with vomiting, unspecified: Secondary | ICD-10-CM

## 2011-01-03 ENCOUNTER — Encounter (HOSPITAL_BASED_OUTPATIENT_CLINIC_OR_DEPARTMENT_OTHER): Payer: 59 | Admitting: Oncology

## 2011-01-03 DIAGNOSIS — R112 Nausea with vomiting, unspecified: Secondary | ICD-10-CM

## 2011-01-03 DIAGNOSIS — C01 Malignant neoplasm of base of tongue: Secondary | ICD-10-CM

## 2011-01-07 ENCOUNTER — Other Ambulatory Visit: Payer: Self-pay | Admitting: Oncology

## 2011-01-07 ENCOUNTER — Encounter (HOSPITAL_BASED_OUTPATIENT_CLINIC_OR_DEPARTMENT_OTHER): Payer: 59 | Admitting: Oncology

## 2011-01-07 DIAGNOSIS — K121 Other forms of stomatitis: Secondary | ICD-10-CM

## 2011-01-07 DIAGNOSIS — K1230 Oral mucositis (ulcerative), unspecified: Secondary | ICD-10-CM

## 2011-01-07 DIAGNOSIS — E46 Unspecified protein-calorie malnutrition: Secondary | ICD-10-CM

## 2011-01-07 DIAGNOSIS — E860 Dehydration: Secondary | ICD-10-CM

## 2011-01-07 DIAGNOSIS — C01 Malignant neoplasm of base of tongue: Secondary | ICD-10-CM

## 2011-01-07 DIAGNOSIS — C159 Malignant neoplasm of esophagus, unspecified: Secondary | ICD-10-CM

## 2011-01-07 LAB — CBC WITH DIFFERENTIAL/PLATELET
BASO%: 0 % (ref 0.0–2.0)
Basophils Absolute: 0 10*3/uL (ref 0.0–0.1)
EOS%: 1.1 % (ref 0.0–7.0)
Eosinophils Absolute: 0.1 10*3/uL (ref 0.0–0.5)
HCT: 29.3 % — ABNORMAL LOW (ref 38.4–49.9)
HGB: 10.2 g/dL — ABNORMAL LOW (ref 13.0–17.1)
LYMPH%: 2.6 % — ABNORMAL LOW (ref 14.0–49.0)
MCH: 30 pg (ref 27.2–33.4)
MCHC: 34.9 g/dL (ref 32.0–36.0)
MCV: 85.9 fL (ref 79.3–98.0)
MONO#: 0.9 10*3/uL (ref 0.1–0.9)
MONO%: 12.5 % (ref 0.0–14.0)
NEUT#: 5.9 10*3/uL (ref 1.5–6.5)
NEUT%: 83.8 % — ABNORMAL HIGH (ref 39.0–75.0)
Platelets: 193 10*3/uL (ref 140–400)
RBC: 3.41 10*6/uL — ABNORMAL LOW (ref 4.20–5.82)
RDW: 17.2 % — ABNORMAL HIGH (ref 11.0–14.6)
WBC: 7.1 10*3/uL (ref 4.0–10.3)
lymph#: 0.2 10*3/uL — ABNORMAL LOW (ref 0.9–3.3)

## 2011-01-07 LAB — COMPREHENSIVE METABOLIC PANEL
ALT: 15 U/L (ref 0–53)
AST: 15 U/L (ref 0–37)
Albumin: 4.2 g/dL (ref 3.5–5.2)
Alkaline Phosphatase: 107 U/L (ref 39–117)
BUN: 34 mg/dL — ABNORMAL HIGH (ref 6–23)
CO2: 29 mEq/L (ref 19–32)
Calcium: 9.6 mg/dL (ref 8.4–10.5)
Chloride: 97 mEq/L (ref 96–112)
Creatinine, Ser: 1.35 mg/dL (ref 0.40–1.50)
Glucose, Bld: 107 mg/dL — ABNORMAL HIGH (ref 70–99)
Potassium: 6 mEq/L — ABNORMAL HIGH (ref 3.5–5.3)
Sodium: 135 mEq/L (ref 135–145)
Total Bilirubin: 0.3 mg/dL (ref 0.3–1.2)
Total Protein: 6.8 g/dL (ref 6.0–8.3)

## 2011-01-07 LAB — MAGNESIUM: Magnesium: 2.1 mg/dL (ref 1.5–2.5)

## 2011-01-11 ENCOUNTER — Ambulatory Visit (INDEPENDENT_AMBULATORY_CARE_PROVIDER_SITE_OTHER): Payer: 59 | Admitting: Family Medicine

## 2011-01-11 DIAGNOSIS — E876 Hypokalemia: Secondary | ICD-10-CM

## 2011-01-17 ENCOUNTER — Ambulatory Visit (INDEPENDENT_AMBULATORY_CARE_PROVIDER_SITE_OTHER): Payer: 59

## 2011-01-17 DIAGNOSIS — Z79899 Other long term (current) drug therapy: Secondary | ICD-10-CM

## 2011-01-18 ENCOUNTER — Ambulatory Visit: Payer: 59 | Attending: Oncology

## 2011-01-18 DIAGNOSIS — R1310 Dysphagia, unspecified: Secondary | ICD-10-CM | POA: Insufficient documentation

## 2011-01-18 DIAGNOSIS — IMO0001 Reserved for inherently not codable concepts without codable children: Secondary | ICD-10-CM | POA: Insufficient documentation

## 2011-01-29 LAB — POCT HEMOGLOBIN-HEMACUE: Hemoglobin: 15.3 g/dL (ref 13.0–17.0)

## 2011-01-29 LAB — GLUCOSE, CAPILLARY: Glucose-Capillary: 109 mg/dL — ABNORMAL HIGH (ref 70–99)

## 2011-01-31 ENCOUNTER — Ambulatory Visit (HOSPITAL_COMMUNITY)
Admission: RE | Admit: 2011-01-31 | Discharge: 2011-01-31 | Disposition: A | Discharge status: Home / Self Care | Payer: 59 | Source: Ambulatory Visit | Attending: Interventional Radiology | Admitting: Interventional Radiology

## 2011-01-31 ENCOUNTER — Other Ambulatory Visit (HOSPITAL_COMMUNITY): Payer: Self-pay | Admitting: Interventional Radiology

## 2011-01-31 DIAGNOSIS — C029 Malignant neoplasm of tongue, unspecified: Secondary | ICD-10-CM

## 2011-02-04 ENCOUNTER — Other Ambulatory Visit (HOSPITAL_COMMUNITY): Payer: Self-pay | Admitting: Dentistry

## 2011-02-04 DIAGNOSIS — R439 Unspecified disturbances of smell and taste: Secondary | ICD-10-CM

## 2011-02-04 DIAGNOSIS — R1310 Dysphagia, unspecified: Secondary | ICD-10-CM

## 2011-02-04 DIAGNOSIS — K117 Disturbances of salivary secretion: Secondary | ICD-10-CM

## 2011-02-06 ENCOUNTER — Ambulatory Visit: Payer: 59 | Admitting: Family Medicine

## 2011-02-06 ENCOUNTER — Ambulatory Visit: Payer: 59 | Attending: Radiation Oncology | Admitting: Radiation Oncology

## 2011-02-07 ENCOUNTER — Ambulatory Visit (INDEPENDENT_AMBULATORY_CARE_PROVIDER_SITE_OTHER): Payer: Self-pay | Admitting: Family Medicine

## 2011-02-14 ENCOUNTER — Encounter (HOSPITAL_BASED_OUTPATIENT_CLINIC_OR_DEPARTMENT_OTHER): Payer: 59 | Admitting: Oncology

## 2011-02-14 ENCOUNTER — Other Ambulatory Visit: Payer: Self-pay | Admitting: Oncology

## 2011-02-14 DIAGNOSIS — C01 Malignant neoplasm of base of tongue: Secondary | ICD-10-CM

## 2011-02-14 DIAGNOSIS — K121 Other forms of stomatitis: Secondary | ICD-10-CM

## 2011-02-14 DIAGNOSIS — E46 Unspecified protein-calorie malnutrition: Secondary | ICD-10-CM

## 2011-02-14 DIAGNOSIS — E860 Dehydration: Secondary | ICD-10-CM

## 2011-02-14 DIAGNOSIS — C099 Malignant neoplasm of tonsil, unspecified: Secondary | ICD-10-CM

## 2011-02-14 LAB — COMPREHENSIVE METABOLIC PANEL
ALT: 16 U/L (ref 0–53)
AST: 17 U/L (ref 0–37)
Albumin: 4.3 g/dL (ref 3.5–5.2)
Alkaline Phosphatase: 99 U/L (ref 39–117)
BUN: 28 mg/dL — ABNORMAL HIGH (ref 6–23)
CO2: 25 mEq/L (ref 19–32)
Calcium: 9.4 mg/dL (ref 8.4–10.5)
Chloride: 97 mEq/L (ref 96–112)
Creatinine, Ser: 1.07 mg/dL (ref 0.40–1.50)
Glucose, Bld: 106 mg/dL — ABNORMAL HIGH (ref 70–99)
Potassium: 5.4 mEq/L — ABNORMAL HIGH (ref 3.5–5.3)
Sodium: 133 mEq/L — ABNORMAL LOW (ref 135–145)
Total Bilirubin: 0.2 mg/dL — ABNORMAL LOW (ref 0.3–1.2)
Total Protein: 6.9 g/dL (ref 6.0–8.3)

## 2011-02-14 LAB — CBC WITH DIFFERENTIAL/PLATELET
BASO%: 0.2 % (ref 0.0–2.0)
Basophils Absolute: 0 10*3/uL (ref 0.0–0.1)
EOS%: 2.9 % (ref 0.0–7.0)
Eosinophils Absolute: 0.1 10*3/uL (ref 0.0–0.5)
HCT: 28.9 % — ABNORMAL LOW (ref 38.4–49.9)
HGB: 10.3 g/dL — ABNORMAL LOW (ref 13.0–17.1)
LYMPH%: 8.6 % — ABNORMAL LOW (ref 14.0–49.0)
MCH: 32.9 pg (ref 27.2–33.4)
MCHC: 35.7 g/dL (ref 32.0–36.0)
MCV: 92.3 fL (ref 79.3–98.0)
MONO#: 0.5 10*3/uL (ref 0.1–0.9)
MONO%: 17.5 % — ABNORMAL HIGH (ref 0.0–14.0)
NEUT#: 2 10*3/uL (ref 1.5–6.5)
NEUT%: 70.8 % (ref 39.0–75.0)
Platelets: 211 10*3/uL (ref 140–400)
RBC: 3.13 10*6/uL — ABNORMAL LOW (ref 4.20–5.82)
RDW: 18.7 % — ABNORMAL HIGH (ref 11.0–14.6)
WBC: 2.8 10*3/uL — ABNORMAL LOW (ref 4.0–10.3)
lymph#: 0.2 10*3/uL — ABNORMAL LOW (ref 0.9–3.3)

## 2011-03-04 ENCOUNTER — Ambulatory Visit: Payer: 59 | Attending: Oncology

## 2011-03-04 DIAGNOSIS — R1310 Dysphagia, unspecified: Secondary | ICD-10-CM | POA: Insufficient documentation

## 2011-03-04 DIAGNOSIS — IMO0001 Reserved for inherently not codable concepts without codable children: Secondary | ICD-10-CM | POA: Insufficient documentation

## 2011-03-26 ENCOUNTER — Encounter (HOSPITAL_COMMUNITY): Payer: Self-pay

## 2011-03-26 ENCOUNTER — Encounter (HOSPITAL_COMMUNITY)
Admission: RE | Admit: 2011-03-26 | Discharge: 2011-03-26 | Disposition: A | Discharge status: Home / Self Care | Payer: 59 | Source: Ambulatory Visit | Attending: Oncology | Admitting: Oncology

## 2011-03-26 DIAGNOSIS — C159 Malignant neoplasm of esophagus, unspecified: Secondary | ICD-10-CM

## 2011-03-26 DIAGNOSIS — C119 Malignant neoplasm of nasopharynx, unspecified: Secondary | ICD-10-CM | POA: Insufficient documentation

## 2011-03-26 LAB — GLUCOSE, CAPILLARY: Glucose-Capillary: 101 mg/dL — ABNORMAL HIGH (ref 70–99)

## 2011-03-26 MED ORDER — FLUDEOXYGLUCOSE F - 18 (FDG) INJECTION
17.7000 | Freq: Once | INTRAVENOUS | Status: AC | PRN
  Administered 2011-03-26: 17.7 via INTRAVENOUS

## 2011-03-27 ENCOUNTER — Other Ambulatory Visit: Payer: Self-pay | Admitting: Medical

## 2011-03-27 ENCOUNTER — Other Ambulatory Visit: Payer: Self-pay | Admitting: Oncology

## 2011-03-27 ENCOUNTER — Encounter (HOSPITAL_BASED_OUTPATIENT_CLINIC_OR_DEPARTMENT_OTHER): Payer: 59 | Admitting: Oncology

## 2011-03-27 DIAGNOSIS — E46 Unspecified protein-calorie malnutrition: Secondary | ICD-10-CM

## 2011-03-27 DIAGNOSIS — K121 Other forms of stomatitis: Secondary | ICD-10-CM

## 2011-03-27 DIAGNOSIS — C01 Malignant neoplasm of base of tongue: Secondary | ICD-10-CM

## 2011-03-27 DIAGNOSIS — C140 Malignant neoplasm of pharynx, unspecified: Secondary | ICD-10-CM

## 2011-03-27 LAB — CBC WITH DIFFERENTIAL/PLATELET
BASO%: 0.1 % (ref 0.0–2.0)
Basophils Absolute: 0 10*3/uL (ref 0.0–0.1)
EOS%: 1.7 % (ref 0.0–7.0)
Eosinophils Absolute: 0.1 10*3/uL (ref 0.0–0.5)
HCT: 31.8 % — ABNORMAL LOW (ref 38.4–49.9)
HGB: 11.3 g/dL — ABNORMAL LOW (ref 13.0–17.1)
LYMPH%: 7.9 % — ABNORMAL LOW (ref 14.0–49.0)
MCH: 33.5 pg — ABNORMAL HIGH (ref 27.2–33.4)
MCHC: 35.5 g/dL (ref 32.0–36.0)
MCV: 94.6 fL (ref 79.3–98.0)
MONO#: 0.4 10*3/uL (ref 0.1–0.9)
MONO%: 11.5 % (ref 0.0–14.0)
NEUT#: 2.5 10*3/uL (ref 1.5–6.5)
NEUT%: 78.8 % — ABNORMAL HIGH (ref 39.0–75.0)
Platelets: 199 10*3/uL (ref 140–400)
RBC: 3.37 10*6/uL — ABNORMAL LOW (ref 4.20–5.82)
RDW: 14.2 % (ref 11.0–14.6)
WBC: 3.2 10*3/uL — ABNORMAL LOW (ref 4.0–10.3)
lymph#: 0.2 10*3/uL — ABNORMAL LOW (ref 0.9–3.3)

## 2011-03-27 LAB — COMPREHENSIVE METABOLIC PANEL
ALT: 14 U/L (ref 0–53)
AST: 18 U/L (ref 0–37)
Albumin: 4.3 g/dL (ref 3.5–5.2)
Alkaline Phosphatase: 100 U/L (ref 39–117)
BUN: 18 mg/dL (ref 6–23)
CO2: 22 mEq/L (ref 19–32)
Calcium: 9.6 mg/dL (ref 8.4–10.5)
Chloride: 95 mEq/L — ABNORMAL LOW (ref 96–112)
Creatinine, Ser: 1.01 mg/dL (ref 0.40–1.50)
Glucose, Bld: 98 mg/dL (ref 70–99)
Potassium: 5.4 mEq/L — ABNORMAL HIGH (ref 3.5–5.3)
Sodium: 130 mEq/L — ABNORMAL LOW (ref 135–145)
Total Bilirubin: 0.3 mg/dL (ref 0.3–1.2)
Total Protein: 6.8 g/dL (ref 6.0–8.3)

## 2011-03-27 LAB — T4, FREE: Free T4: 0.9 ng/dL (ref 0.80–1.80)

## 2011-03-27 LAB — TSH: TSH: 1.743 u[IU]/mL (ref 0.350–4.500)

## 2011-04-01 ENCOUNTER — Ambulatory Visit: Payer: 59 | Attending: Oncology

## 2011-04-01 DIAGNOSIS — R1310 Dysphagia, unspecified: Secondary | ICD-10-CM | POA: Insufficient documentation

## 2011-04-01 DIAGNOSIS — IMO0001 Reserved for inherently not codable concepts without codable children: Secondary | ICD-10-CM | POA: Insufficient documentation

## 2011-04-03 ENCOUNTER — Ambulatory Visit
Admission: RE | Admit: 2011-04-03 | Discharge: 2011-04-03 | Disposition: A | Discharge status: Home / Self Care | Payer: 59 | Source: Ambulatory Visit | Attending: Radiation Oncology | Admitting: Radiation Oncology

## 2011-04-25 ENCOUNTER — Other Ambulatory Visit: Payer: Self-pay | Admitting: Family Medicine

## 2011-04-25 MED ORDER — FLUTICASONE PROPIONATE 50 MCG/ACT NA SUSP: 2.0000 | Freq: Every day | NASAL | Status: DC

## 2011-05-06 ENCOUNTER — Encounter (HOSPITAL_BASED_OUTPATIENT_CLINIC_OR_DEPARTMENT_OTHER): Payer: 59 | Admitting: Oncology

## 2011-05-06 ENCOUNTER — Other Ambulatory Visit: Payer: Self-pay | Admitting: Oncology

## 2011-05-06 DIAGNOSIS — E860 Dehydration: Secondary | ICD-10-CM

## 2011-05-06 DIAGNOSIS — K121 Other forms of stomatitis: Secondary | ICD-10-CM

## 2011-05-06 DIAGNOSIS — C01 Malignant neoplasm of base of tongue: Secondary | ICD-10-CM

## 2011-05-06 DIAGNOSIS — E46 Unspecified protein-calorie malnutrition: Secondary | ICD-10-CM

## 2011-05-06 LAB — CBC WITH DIFFERENTIAL/PLATELET
BASO%: 0.1 % (ref 0.0–2.0)
Basophils Absolute: 0 10*3/uL (ref 0.0–0.1)
EOS%: 2.8 % (ref 0.0–7.0)
Eosinophils Absolute: 0.1 10*3/uL (ref 0.0–0.5)
HCT: 32.1 % — ABNORMAL LOW (ref 38.4–49.9)
HGB: 11.1 g/dL — ABNORMAL LOW (ref 13.0–17.1)
LYMPH%: 9.9 % — ABNORMAL LOW (ref 14.0–49.0)
MCH: 31.9 pg (ref 27.2–33.4)
MCHC: 34.5 g/dL (ref 32.0–36.0)
MCV: 92.5 fL (ref 79.3–98.0)
MONO#: 0.4 10*3/uL (ref 0.1–0.9)
MONO%: 12.5 % (ref 0.0–14.0)
NEUT#: 2.5 10*3/uL (ref 1.5–6.5)
NEUT%: 74.7 % (ref 39.0–75.0)
Platelets: 180 10*3/uL (ref 140–400)
RBC: 3.47 10*6/uL — ABNORMAL LOW (ref 4.20–5.82)
RDW: 13.4 % (ref 11.0–14.6)
WBC: 3.4 10*3/uL — ABNORMAL LOW (ref 4.0–10.3)
lymph#: 0.3 10*3/uL — ABNORMAL LOW (ref 0.9–3.3)

## 2011-05-08 ENCOUNTER — Other Ambulatory Visit: Payer: Self-pay | Admitting: Radiation Oncology

## 2011-05-08 DIAGNOSIS — C01 Malignant neoplasm of base of tongue: Secondary | ICD-10-CM

## 2011-05-13 ENCOUNTER — Ambulatory Visit (HOSPITAL_COMMUNITY)
Admission: RE | Admit: 2011-05-13 | Discharge: 2011-05-13 | Disposition: A | Discharge status: Home / Self Care | Payer: 59 | Source: Ambulatory Visit | Attending: Radiation Oncology | Admitting: Radiation Oncology

## 2011-05-13 DIAGNOSIS — C01 Malignant neoplasm of base of tongue: Secondary | ICD-10-CM

## 2011-05-13 DIAGNOSIS — K121 Other forms of stomatitis: Secondary | ICD-10-CM | POA: Insufficient documentation

## 2011-05-13 DIAGNOSIS — E46 Unspecified protein-calorie malnutrition: Secondary | ICD-10-CM | POA: Insufficient documentation

## 2011-05-13 DIAGNOSIS — Z431 Encounter for attention to gastrostomy: Secondary | ICD-10-CM | POA: Insufficient documentation

## 2011-08-07 ENCOUNTER — Ambulatory Visit
Admission: RE | Admit: 2011-08-07 | Discharge: 2011-08-07 | Disposition: A | Discharge status: Home / Self Care | Payer: 59 | Source: Ambulatory Visit | Attending: Radiation Oncology | Admitting: Radiation Oncology

## 2011-08-29 ENCOUNTER — Other Ambulatory Visit (HOSPITAL_COMMUNITY): Payer: Self-pay

## 2011-09-20 ENCOUNTER — Encounter: Payer: Self-pay | Admitting: Oncology

## 2011-09-20 ENCOUNTER — Other Ambulatory Visit: Payer: Self-pay | Admitting: Oncology

## 2011-09-20 DIAGNOSIS — F329 Major depressive disorder, single episode, unspecified: Secondary | ICD-10-CM

## 2011-09-20 DIAGNOSIS — K5790 Diverticulosis of intestine, part unspecified, without perforation or abscess without bleeding: Secondary | ICD-10-CM | POA: Insufficient documentation

## 2011-09-20 DIAGNOSIS — C109 Malignant neoplasm of oropharynx, unspecified: Secondary | ICD-10-CM

## 2011-09-20 DIAGNOSIS — F32A Depression, unspecified: Secondary | ICD-10-CM | POA: Insufficient documentation

## 2011-10-01 ENCOUNTER — Encounter: Payer: Self-pay | Admitting: *Deleted

## 2011-10-04 ENCOUNTER — Ambulatory Visit (HOSPITAL_COMMUNITY)
Admission: RE | Admit: 2011-10-04 | Discharge: 2011-10-04 | Disposition: A | Discharge status: Home / Self Care | Payer: 59 | Source: Ambulatory Visit | Attending: Oncology | Admitting: Oncology

## 2011-10-04 ENCOUNTER — Other Ambulatory Visit (HOSPITAL_BASED_OUTPATIENT_CLINIC_OR_DEPARTMENT_OTHER): Payer: 59 | Admitting: Lab

## 2011-10-04 ENCOUNTER — Other Ambulatory Visit: Payer: Self-pay | Admitting: Oncology

## 2011-10-04 ENCOUNTER — Encounter (HOSPITAL_COMMUNITY): Payer: Self-pay

## 2011-10-04 DIAGNOSIS — C01 Malignant neoplasm of base of tongue: Secondary | ICD-10-CM

## 2011-10-04 DIAGNOSIS — B977 Papillomavirus as the cause of diseases classified elsewhere: Secondary | ICD-10-CM

## 2011-10-04 DIAGNOSIS — C140 Malignant neoplasm of pharynx, unspecified: Secondary | ICD-10-CM

## 2011-10-04 LAB — CBC WITH DIFFERENTIAL/PLATELET
BASO%: 0.2 % (ref 0.0–2.0)
Basophils Absolute: 0 10*3/uL (ref 0.0–0.1)
EOS%: 2.5 % (ref 0.0–7.0)
Eosinophils Absolute: 0.1 10*3/uL (ref 0.0–0.5)
HCT: 35.4 % — ABNORMAL LOW (ref 38.4–49.9)
HGB: 12.2 g/dL — ABNORMAL LOW (ref 13.0–17.1)
LYMPH%: 16.9 % (ref 14.0–49.0)
MCH: 30.7 pg (ref 27.2–33.4)
MCHC: 34.4 g/dL (ref 32.0–36.0)
MCV: 89.2 fL (ref 79.3–98.0)
MONO#: 0.3 10*3/uL (ref 0.1–0.9)
MONO%: 13.1 % (ref 0.0–14.0)
NEUT#: 1.6 10*3/uL (ref 1.5–6.5)
NEUT%: 67.3 % (ref 39.0–75.0)
Platelets: 191 10*3/uL (ref 140–400)
RBC: 3.97 10*6/uL — ABNORMAL LOW (ref 4.20–5.82)
RDW: 14 % (ref 11.0–14.6)
WBC: 2.4 10*3/uL — ABNORMAL LOW (ref 4.0–10.3)
lymph#: 0.4 10*3/uL — ABNORMAL LOW (ref 0.9–3.3)

## 2011-10-04 LAB — CMP (CANCER CENTER ONLY)
ALT(SGPT): 15 U/L (ref 10–47)
AST: 18 U/L (ref 11–38)
Albumin: 3.4 g/dL (ref 3.3–5.5)
Alkaline Phosphatase: 89 U/L — ABNORMAL HIGH (ref 26–84)
BUN, Bld: 15 mg/dL (ref 7–22)
CO2: 28 mEq/L (ref 18–33)
Calcium: 9.1 mg/dL (ref 8.0–10.3)
Chloride: 98 mEq/L (ref 98–108)
Creat: 1 mg/dl (ref 0.6–1.2)
Glucose, Bld: 97 mg/dL (ref 73–118)
Potassium: 4.4 mEq/L (ref 3.3–4.7)
Sodium: 140 mEq/L (ref 128–145)
Total Bilirubin: 0.5 mg/dl (ref 0.20–1.60)
Total Protein: 7 g/dL (ref 6.4–8.1)

## 2011-10-04 LAB — TSH: TSH: 3.352 u[IU]/mL (ref 0.350–4.500)

## 2011-10-04 MED ORDER — IOHEXOL 300 MG/ML  SOLN
100.0000 mL | Freq: Once | INTRAMUSCULAR | Status: AC | PRN
  Administered 2011-10-04: 100 mL via INTRAVENOUS

## 2011-10-07 ENCOUNTER — Ambulatory Visit (HOSPITAL_BASED_OUTPATIENT_CLINIC_OR_DEPARTMENT_OTHER): Payer: 59 | Admitting: Oncology

## 2011-10-07 ENCOUNTER — Telehealth: Payer: Self-pay | Admitting: Oncology

## 2011-10-07 VITALS — BP 139/86 | HR 77 | Temp 97.1°F | Ht 72.0 in | Wt 161.3 lb

## 2011-10-07 DIAGNOSIS — B977 Papillomavirus as the cause of diseases classified elsewhere: Secondary | ICD-10-CM

## 2011-10-07 DIAGNOSIS — C01 Malignant neoplasm of base of tongue: Secondary | ICD-10-CM

## 2011-10-07 DIAGNOSIS — D649 Anemia, unspecified: Secondary | ICD-10-CM

## 2011-10-07 DIAGNOSIS — C109 Malignant neoplasm of oropharynx, unspecified: Secondary | ICD-10-CM

## 2011-10-07 DIAGNOSIS — D72819 Decreased white blood cell count, unspecified: Secondary | ICD-10-CM

## 2011-10-07 MED ORDER — INFLUENZA VIRUS VACC SPLIT PF IM SUSP
0.5000 mL | INTRAMUSCULAR | Status: DC
  Filled 2011-10-07: qty 0.5

## 2011-10-07 MED ORDER — INFLUENZA VIRUS VACC SPLIT PF IM SUSP
0.5000 mL | Freq: Once | INTRAMUSCULAR | Status: DC
  Filled 2011-10-07: qty 0.5

## 2011-10-07 NOTE — Progress Notes (Signed)
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  PAST THERAPY:  concurrent cisplatin and radiation between November 05, 2010, and November 26, 2010.  He achieved complete response.  CURRENT THEARPY: Watchful observation.  INTERVAL HISTORY: Lee Barnes 60 y.o. male returns for regular follow up.  He has been working full time without fatigue.  He has very mild residual xerostomia; however, he has been able to eat all regular foods without dysphagia, odynophagia.  He denies neck node swelling, fever, headache, mouth sore, hemoptysis, hematemesis, cough, chest pain, abdominal pain, abdominal swelling, melena, hematochezia, hematuria, low back pain, bowel or bladder incontinence, heat or cold intolerance.  MEDICAL HISTORY: Past Medical History  Diagnosis Date  . Diverticulosis   . Allergy to environmental factors   . Depression   . 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  MEDICATIONS: Current Outpatient Prescriptions  Medication Sig Dispense Refill  . fluticasone (FLONASE) 50 MCG/ACT nasal spray Place 2 sprays into the nose daily.  16 g  5   Current Facility-Administered Medications  Medication Dose Route Frequency Provider Last Rate Last Dose  . influenza  inactive virus vaccine (FLUZONE/FLUARIX) injection 0.5 mL  0.5 mL Intramuscular Once Jethro Bolus, MD      . DISCONTD: influenza  inactive virus vaccine (FLUZONE/FLUARIX) injection 0.5 mL  0.5 mL Intramuscular Tomorrow-1000 Jethro Bolus, MD        ALLERGIES:  is allergic to codeine.  REVIEW OF SYSTEMS:  The rest of the 14-point review of system was negative.   Filed Vitals:   10/07/11 1014  BP: 139/86  Pulse: 77  Temp: 97.1 F (36.2 C)   Wt Readings from Last 3 Encounters:  10/07/11 161 lb 4.8 oz (73.165 kg)  07/28/11 161 lb 3.2 oz (73.12 kg)   ECOG  Performance status: 0  PHYSICAL EXAMINATION:   General:  well-nourished in no acute distress.  Eyes:  no scleral icterus.  ENT:  There were no oropharyngeal lesions.  Neck was without thyromegaly.  Lymphatics:  Negative cervical, supraclavicular or axillary adenopathy.  Respiratory: lungs were clear bilaterally without wheezing or crackles.  Cardiovascular:  Regular rate and rhythm, S1/S2, without murmur, rub or gallop.  There was no pedal edema.  GI:  abdomen was soft, flat, nontender, nondistended, without organomegaly.  Muscoloskeletal:  no spinal tenderness of palpation of vertebral spine.  Skin exam was without echymosis, petichae.  Neuro exam was nonfocal.  Patient was able to get on and off exam table without assistance.  Gait was normal.  Patient was alerted and oriented.  Attention was good.   Language was appropriate.  Mood was normal without depression.  Speech was not pressured.  Thought content was not tangential.     LABORATORY/RADIOLOGY DATA:  Lab Results  Component Value Date   WBC 2.4* 10/04/2011   HGB 12.2* 10/04/2011   HCT 35.4* 10/04/2011   PLT 191 10/04/2011   GLUCOSE 97 10/04/2011   ALT 14 03/27/2011   ALT 14 03/27/2011   AST 18 10/04/2011   NA 140 10/04/2011   K 4.4 10/04/2011   CL 98 10/04/2011   CREATININE 1.0 10/04/2011   BUN 15 10/04/2011   CO2 28 10/04/2011   TSH 3.352 10/04/2011   TSH 3.352 10/04/2011    Ct Soft Tissue Neck W Contrast:  I personally reviewed these images myself and showed  the patients the images and the report.   10/04/2011  *RADIOLOGY REPORT*  Clinical Data: Follow-up right base of tongue invasive squamous cell carcinoma carcinoma.  CT NECK WITH CONTRAST  Technique:  Multidetector CT imaging of the neck was performed with intravenous contrast.  Contrast: OMNIPAQUE IOHEXOL 300 MG/ML IV SOLN  Comparison: Original CT neck 09/25/2010.  Follow-up PET scans 09/28/2010 and 03/26/2011.  Findings: No mucosal lesion is seen.  There is complete  resolution of the previously identified level II and level III lymph nodes on the right representing metastatic carcinoma. The base of tongue is obscured by dental amalgam and hardware, but appears grossly negative. No residual or recurrent tumor is seen.  The craniocervical vasculature is widely patent without significant atheromatous change of the carotid bifurcation or cervical ICA narrowing.  Normal larynx. Unremarkable nasopharynx.  Major and minor salivary glands unremarkable.  Normal osseous structures. Subglottic region and trachea normal.  Mild fibrosis in the lung apices.  Unchanged 5 mm peripherally located right upper lobe nodule compared with 2011.  Mild spondylosis but no destructive osseous lesion. No evidence of prevertebral soft tissue swelling. Visualized paranasal sinuses are clear.  Visualized intracranial compartment unremarkable.  IMPRESSION: Apparent satisfactory response following biopsy, chemotherapy, and radiation for invasive base of tongue squamous cell carcinoma.   No evidence for recurrent tumor.  Original Report Authenticated By: Elsie Stain, M.D.    ASSESSMENT AND PLAN:   1. 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  I discussed it with the patient that there was no evidence of residual disease or metastatic disease on today clinical history, physical exam, and CT.   2. 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  There is no concern for bone marrow suppression.  Even though he has leukopenia, he does not have neutropnia, severe anemia or thrombocytopenia.  Will continue to observe.   3. Slight depression:  Resolved her his opinion.  4. Calorie-protein malnutrition.  His weight is improving.  He has no restriction anymore with his diet.  5. Primary care:  His last colonoscopy in 2011 with [facility]  was negative per his report.  He does not know when the next one is due.  I advised him to talk with his PCP to find out when the  next one is due.  I discussed with him pros and cons of influenza vaccination.  He expressed informed understanding and went ahead with it today.   He is not smoking nor is he drinking heavily. The most he drinks is maybe one-two glasses of wine a month.   6. Followup with me and CT neck in about 6 months.  I advised him to follow with ENT, Dr. Ezzard Standing and with radiation oncology, Dr. Dayton Scrape between visits with [place] here.  I advised him to contact us with concerning symptoms.

## 2011-10-07 NOTE — Telephone Encounter (Signed)
gv pt appt schedule for may and ct for 5/1 @ 9:30 am @ [facility].

## 2011-10-29 ENCOUNTER — Ambulatory Visit: Payer: 59 | Admitting: Radiation Oncology

## 2012-02-25 ENCOUNTER — Ambulatory Visit
Admission: RE | Admit: 2012-02-25 | Discharge: 2012-02-25 | Disposition: A | Discharge status: Home / Self Care | Payer: 59 | Source: Ambulatory Visit | Attending: Radiation Oncology | Admitting: Radiation Oncology

## 2012-02-25 ENCOUNTER — Encounter: Payer: Self-pay | Admitting: Radiation Oncology

## 2012-02-25 VITALS — BP 155/91 | HR 72 | Temp 97.2°F | Resp 18 | Wt 165.0 lb

## 2012-02-25 DIAGNOSIS — C109 Malignant neoplasm of oropharynx, unspecified: Secondary | ICD-10-CM

## 2012-02-25 NOTE — Progress Notes (Signed)
Followup note:  [name] returns today approximately one year and 2 months following completion of chemoradiation in the management of his T1 N2 B. squamous cell carcinoma of the right base of tongue. His taste and xerostomia are improved. He saw and Dr. Ezzard Barnes one month ago it was given a good report. He tells me he'll see Dr. Gaylyn Barnes in May and may have a CT scan at that time. He denies having any difficulty swallowing or throat discomfort. He is keeping up with his dentist.  Physical examination: Nodes: There is no palpable lymphadenopathy in the neck. Oral cavity and oropharynx are unremarkable to inspection. His mouth is moist. On indirect mirror examination there is no evidence for recurrent disease along his tongue base. Endolarynx is unremarkable to inspection.  Impression: Satisfactory progress with no evidence for recurrent disease.  Plan: [name] will see Dr. Ezzard Barnes for a followup visit in July and I will see him back for a followup visit in September. He'll see Dr. Gaylyn Barnes in the meantime.

## 2012-02-25 NOTE — Progress Notes (Signed)
Patient presents to the clinic today unaccompanied for a follow up appointment with Dr. Dayton Scrape. Patient is alert and oriented to person, place, and time. No distress noted. Steady gait noted. Pleasant affect noted. Patient denies pain at this time. Patient's weight in Dr. Allene Pyo office last month was 160 but, weight today 165 therefore 5 pound weight gain noted in one month. Patient reports that dry mouth is slightly better. Patient denies ulcerations or sore in the mouth or tongue. Patient reports that overall "taste is pretty good." patient denies headaches, nausea, vomiting, dizziness or diarrhea. Patient reports he is eating and sleeping well. Patient reports he will follow up with Dr. Ezzard Standing in June or July of this year. Patient has no complaints at this time. Reported all findings to Dr. Dayton Scrape.

## 2012-03-03 ENCOUNTER — Ambulatory Visit: Payer: 59 | Admitting: Radiation Oncology

## 2012-03-18 ENCOUNTER — Other Ambulatory Visit (HOSPITAL_BASED_OUTPATIENT_CLINIC_OR_DEPARTMENT_OTHER): Payer: 59 | Admitting: Lab

## 2012-03-18 ENCOUNTER — Ambulatory Visit (HOSPITAL_COMMUNITY)
Admission: RE | Admit: 2012-03-18 | Discharge: 2012-03-18 | Disposition: A | Discharge status: Home / Self Care | Payer: 59 | Source: Ambulatory Visit | Attending: Oncology | Admitting: Oncology

## 2012-03-18 DIAGNOSIS — C109 Malignant neoplasm of oropharynx, unspecified: Secondary | ICD-10-CM

## 2012-03-18 DIAGNOSIS — R911 Solitary pulmonary nodule: Secondary | ICD-10-CM | POA: Insufficient documentation

## 2012-03-18 LAB — CBC WITH DIFFERENTIAL/PLATELET
BASO%: 0.2 % (ref 0.0–2.0)
Basophils Absolute: 0 10*3/uL (ref 0.0–0.1)
EOS%: 2.7 % (ref 0.0–7.0)
Eosinophils Absolute: 0.1 10*3/uL (ref 0.0–0.5)
HCT: 36.6 % — ABNORMAL LOW (ref 38.4–49.9)
HGB: 12.5 g/dL — ABNORMAL LOW (ref 13.0–17.1)
LYMPH%: 14.6 % (ref 14.0–49.0)
MCH: 30.4 pg (ref 27.2–33.4)
MCHC: 34.1 g/dL (ref 32.0–36.0)
MCV: 89.1 fL (ref 79.3–98.0)
MONO#: 0.3 10*3/uL (ref 0.1–0.9)
MONO%: 10.5 % (ref 0.0–14.0)
NEUT#: 2 10*3/uL (ref 1.5–6.5)
NEUT%: 72 % (ref 39.0–75.0)
Platelets: 174 10*3/uL (ref 140–400)
RBC: 4.1 10*6/uL — ABNORMAL LOW (ref 4.20–5.82)
RDW: 13.8 % (ref 11.0–14.6)
WBC: 2.7 10*3/uL — ABNORMAL LOW (ref 4.0–10.3)
lymph#: 0.4 10*3/uL — ABNORMAL LOW (ref 0.9–3.3)
nRBC: 0 % (ref 0–0)

## 2012-03-18 LAB — CMP (CANCER CENTER ONLY)
ALT(SGPT): 18 U/L (ref 10–47)
AST: 24 U/L (ref 11–38)
Albumin: 3.7 g/dL (ref 3.3–5.5)
Alkaline Phosphatase: 99 U/L — ABNORMAL HIGH (ref 26–84)
BUN, Bld: 12 mg/dL (ref 7–22)
CO2: 28 mEq/L (ref 18–33)
Calcium: 9 mg/dL (ref 8.0–10.3)
Chloride: 97 mEq/L — ABNORMAL LOW (ref 98–108)
Creat: 1.2 mg/dl (ref 0.6–1.2)
Glucose, Bld: 106 mg/dL (ref 73–118)
Potassium: 4.6 mEq/L (ref 3.3–4.7)
Sodium: 139 mEq/L (ref 128–145)
Total Bilirubin: 0.8 mg/dl (ref 0.20–1.60)
Total Protein: 7.2 g/dL (ref 6.4–8.1)

## 2012-03-18 LAB — TSH: TSH: 2.664 u[IU]/mL (ref 0.350–4.500)

## 2012-03-18 MED ORDER — IOHEXOL 300 MG/ML  SOLN
100.0000 mL | Freq: Once | INTRAMUSCULAR | Status: AC | PRN
  Administered 2012-03-18: 100 mL via INTRAVENOUS

## 2012-03-19 NOTE — Patient Instructions (Addendum)
A.  Result of CT neck 03/18/2012:   Findings: Postradiation changes with scarring and volume loss to  the lung apices. A small posterior right upper lobe subpleural  nodule is stable on series 4 image 11 and 2011 compatible with  benign etiology. Stable post radiation changes in the neck  including thickening of the pharyngeal mucosal surfaces and  salivary gland changes. No recurrent right cervical  lymphadenopathy. No discrete pharyngeal mass.  The thyroid remains normal. No superior mediastinal  lymphadenopathy. Major vascular structures remain patent.  Negative parapharyngeal and sublingual spaces. Negative visualized  orbit soft tissues and brain parenchyma. Visualized paranasal  sinuses and mastoids are clear. Stable visualized osseous  structures.  IMPRESSION:  Stable and satisfactory post-therapy appearance of the neck.  4-5 mm right upper lobe subpleural nodule is stable since 2011 and  very likely benign.  B.  Follow up:  - CT neck in 6 months. - Follow up with Dr. Lodema Pilot nurse practitioner Belenda Cruise the day after CT neck in 6 months.

## 2012-03-20 ENCOUNTER — Telehealth: Payer: Self-pay | Admitting: Oncology

## 2012-03-20 ENCOUNTER — Ambulatory Visit (HOSPITAL_BASED_OUTPATIENT_CLINIC_OR_DEPARTMENT_OTHER): Payer: 59 | Admitting: Oncology

## 2012-03-20 VITALS — BP 152/84 | HR 68 | Temp 97.1°F | Ht 72.0 in | Wt 162.9 lb

## 2012-03-20 DIAGNOSIS — C109 Malignant neoplasm of oropharynx, unspecified: Secondary | ICD-10-CM

## 2012-03-20 DIAGNOSIS — F329 Major depressive disorder, single episode, unspecified: Secondary | ICD-10-CM

## 2012-03-20 DIAGNOSIS — D630 Anemia in neoplastic disease: Secondary | ICD-10-CM

## 2012-03-20 DIAGNOSIS — F3289 Other specified depressive episodes: Secondary | ICD-10-CM

## 2012-03-20 DIAGNOSIS — D72819 Decreased white blood cell count, unspecified: Secondary | ICD-10-CM

## 2012-03-20 NOTE — Progress Notes (Signed)
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   PAST THERAPY: concurrent cisplatin and radiation between November 05, 2010, and November 26, 2010. He achieved complete  response.   CURRENT THEARPY: Watchful observation.  INTERVAL HISTORY: Lee Barnes 61 y.o. male returns for regular follow up.  He reports doing well.  He is working full time without fatigue.  He has low appetite compared to prior to chemotherapy.  However, he has not had any weight loss.  He is able to eat all kind of foods without restriction.  He still has mild xerostomia.  He denies dysphagia, odynophagia, palpable nodes.    Patient denies fatigue, headache, visual changes, confusion, drenching night sweats, palpable lymph node swelling, mucositis, odynophagia, dysphagia, nausea vomiting, jaundice, chest pain, palpitation, shortness of breath, dyspnea on exertion, productive cough, gum bleeding, epistaxis, hematemesis, hemoptysis, abdominal pain, abdominal swelling, early satiety, melena, hematochezia, hematuria, skin rash, spontaneous bleeding, joint swelling, joint pain, heat or cold intolerance, bowel bladder incontinence, back pain, focal motor weakness, paresthesia, depression, suicidal or homocidal ideation, feeling hopelessness.   Past Medical History  Diagnosis Date  . Diverticulosis   . Allergy to environmental factors   . Depression   . 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  Current Outpatient Prescriptions  Medication Sig Dispense Refill  . fluticasone (FLONASE) 50 MCG/ACT nasal spray Place 2 sprays into the nose daily as needed.      Marland Kitchen DISCONTD: fluticasone (FLONASE) 50 MCG/ACT nasal spray Place 2 sprays into the nose daily.  16 g  5    ALLERGIES:  is allergic to codeine.  REVIEW OF  SYSTEMS:  The rest of the 14-point review of system was negative.   Filed Vitals:   03/20/12 1005  BP: 152/84  Pulse: 68  Temp: 97.1 F (36.2 C)   Wt Readings from Last 3 Encounters:  03/20/12 162 lb 14.4 oz (73.891 kg)  02/25/12 165 lb (74.844 kg)  10/07/11 161 lb 4.8 oz (73.165 kg)   ECOG Performance status: 0  PHYSICAL EXAMINATION:   General:  Thin-appearing man, in no acute distress.  Eyes:  no scleral icterus.  ENT:  There were no oropharyngeal lesions.  Neck was without thyromegaly.  Lymphatics:  Negative cervical, supraclavicular or axillary adenopathy.  Respiratory: lungs were clear bilaterally without wheezing or crackles.  Cardiovascular:  Regular rate and rhythm, S1/S2, without murmur, rub or gallop.  There was no pedal edema.  GI:  abdomen was soft, flat, nontender, nondistended, without organomegaly.  Muscoloskeletal:  no spinal tenderness of palpation of vertebral spine.  Skin exam was without echymosis, petichae.  Neuro exam was nonfocal.  Patient was able to get on and off exam table without assistance.  Gait was normal.  Patient was alerted and oriented.  Attention was good.   Language was appropriate.  Mood was normal without depression.  Speech was not pressured.  Thought content was not tangential.     LABORATORY/RADIOLOGY DATA:  Lab Results  Component Value Date   WBC 2.7* 03/18/2012   HGB 12.5* 03/18/2012   HCT 36.6* 03/18/2012   PLT 174 03/18/2012   GLUCOSE 106 03/18/2012   ALKPHOS 99* 03/18/2012   ALT 14 03/27/2011   ALT 14 03/27/2011   AST 24 03/18/2012   NA 139 03/18/2012   K 4.6  03/18/2012   CL 97* 03/18/2012   CREATININE 1.2 03/18/2012   BUN 12 03/18/2012   CO2 28 03/18/2012    RADIOLOGY:   I personally reviewed the following CT and showed the patient the images.  In brief, there was no evidence of recurrent disease.   Ct Soft Tissue Neck W Contrast  03/18/2012  *RADIOLOGY REPORT*  Clinical Data: 61 year old male with history of oral pharyngeal cancer diagnosed in 2011.   Chemotherapy and radiation complete.  CT NECK WITH CONTRAST  Technique:  Multidetector CT imaging of the neck was performed with intravenous contrast.  Contrast: OMNIPAQUE IOHEXOL 300 MG/ML  SOLN  Comparison: 10/04/2011, 09/25/2010.  Findings: Postradiation changes with scarring and volume loss to the lung apices.  A small posterior right upper lobe subpleural nodule is stable on series 4 image 11 and 2011 compatible with benign etiology. Stable post radiation changes in the neck including thickening of the pharyngeal mucosal surfaces and salivary gland changes.  No recurrent right cervical lymphadenopathy.  No discrete pharyngeal mass.  The thyroid remains normal.  No superior mediastinal lymphadenopathy.  Major vascular structures remain patent. Negative parapharyngeal and sublingual spaces.  Negative visualized orbit soft tissues and brain parenchyma. Visualized paranasal sinuses and mastoids are clear.  Stable visualized osseous structures.  IMPRESSION: Stable and satisfactory post-therapy appearance of the neck. 4-5 mm right upper lobe subpleural nodule is stable since 2011 and very likely benign.  Original Report Authenticated By: Harley Hallmark, M.D.    ASSESSMENT AND PLAN:   1. 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 I discussed it with the patient that there was no evidence of residual disease or metastatic disease on today clinical history, physical exam, and CT.   2. Mild leukopenia and anemia:  Most likely residual effects from chemo.  Cisplatin is not commonly known to cause MDS/AML.  I will continue to monitor his CBC and may consider bone marrow biopsy when ANC <1.5 or he develops pancytopenia.  3. Slight depression: he seems to adjust well today.  4. Calorie-protein malnutrition. His weight is stable. He has no restriction anymore with his diet.   5. Primary care: His last colonoscopy in 2011 with [facility] was negative per his report. He is not smoking nor  is he drinking heavily. The most he drinks is maybe one-two glasses of wine a month.  6. Followup with CT neck in about 6 months. I advised him to follow with ENT, Dr. Ezzard Standing and with radiation oncology, Dr. Dayton Scrape between visits with [place] here.     The length of time of the face-to-face encounter was . More than 50% of time was spent counseling and coordination of care.

## 2012-03-20 NOTE — Telephone Encounter (Signed)
appts made and printed for pt aom °

## 2012-07-24 ENCOUNTER — Encounter: Payer: Self-pay | Admitting: Radiation Oncology

## 2012-07-28 ENCOUNTER — Encounter: Payer: Self-pay | Admitting: Radiation Oncology

## 2012-07-28 ENCOUNTER — Ambulatory Visit
Admission: RE | Admit: 2012-07-28 | Discharge: 2012-07-28 | Disposition: A | Discharge status: Home / Self Care | Payer: 59 | Source: Ambulatory Visit | Attending: Radiation Oncology | Admitting: Radiation Oncology

## 2012-07-28 VITALS — BP 153/77 | HR 74 | Temp 97.7°F | Resp 20 | Wt 159.1 lb

## 2012-07-28 DIAGNOSIS — C109 Malignant neoplasm of oropharynx, unspecified: Secondary | ICD-10-CM

## 2012-07-28 NOTE — Progress Notes (Signed)
F/u squamous cell ca right base of tongue, rad txs:  11/05/10-12/26/10 Alert,oriented x3 No c/o pain, eating well, no difficulty swallowing, having sinus isssues, also had a chil dog last Tuesday and has had diarrhea since, not taking imdodium, had diarrhea at 315am this am, drinks mostly gator aid, hasn't eten breakfast yet, no c/o pain 10:13 AM

## 2012-07-28 NOTE — Progress Notes (Signed)
Followup note:  Lee Barnes returns today approximately one year and 7 months following completion of chemoradiation in the management of his stage T1 N2B squamous cell carcinoma the right base of tongue. His taste is almost returned to normal. He still has some residual xerostomia. He saw Dr. Ezzard Standing in July and was given a good report. He'll see Dr. Ezzard Standing again in November. He saw Dr. Gaylyn Rong this past May at which time he had a CT scan which was without evidence for recurrent disease. Dr. Gaylyn Rong will see him again in November with a repeat CT scan. His weight remained stable. He's had diarrhea for a week after eating a chili  dog at [employer]. He has not yet seen his primary care physician. He is keeping up with his dentist.  Physical examination: Alert and oriented.  Wt Readings from Last 3 Encounters:  07/28/12 159 lb 1.6 oz (72.167 kg)  03/20/12 162 lb 14.4 oz (73.891 kg)  02/25/12 165 lb (74.844 kg)   Temp Readings from Last 3 Encounters:  07/28/12 97.7 F (36.5 C) Oral  03/20/12 97.1 F (36.2 C) Oral  02/25/12 97.2 F (36.2 C) Oral   BP Readings from Last 3 Encounters:  07/28/12 153/77  03/20/12 152/84  02/25/12 155/91   Pulse Readings from Last 3 Encounters:  07/28/12 74  03/20/12 68  02/25/12 72   Nodes: There is no palpable lymphadenopathy in the neck. Oral cavity and oropharynx are unremarkable for mild to moderate xerostomia. Indirect mirror examination without evidence for recurrent disease. Palpation of the tongue base also unremarkable and without evidence for recurrent disease. Of note is that the tongue deviates slightly to the right with protrusion.  Impression: No evidence for recurrent disease.  Plan: Followup visit here in 4 months, provided that he see Dr. Ezzard Standing in 2 months. We can spread his followup visits out after early next year when he is 2 years out from completion of therapy.

## 2012-09-21 ENCOUNTER — Ambulatory Visit (HOSPITAL_COMMUNITY)
Admission: RE | Admit: 2012-09-21 | Discharge: 2012-09-21 | Disposition: A | Discharge status: Home / Self Care | Payer: 59 | Source: Ambulatory Visit | Attending: Oncology | Admitting: Oncology

## 2012-09-21 ENCOUNTER — Other Ambulatory Visit (HOSPITAL_BASED_OUTPATIENT_CLINIC_OR_DEPARTMENT_OTHER): Payer: 59 | Admitting: Lab

## 2012-09-21 ENCOUNTER — Encounter (HOSPITAL_COMMUNITY): Payer: Self-pay

## 2012-09-21 DIAGNOSIS — C109 Malignant neoplasm of oropharynx, unspecified: Secondary | ICD-10-CM | POA: Insufficient documentation

## 2012-09-21 LAB — COMPREHENSIVE METABOLIC PANEL (CC13)
ALT: 12 U/L (ref 0–55)
AST: 17 U/L (ref 5–34)
Albumin: 4 g/dL (ref 3.5–5.0)
Alkaline Phosphatase: 103 U/L (ref 40–150)
BUN: 10 mg/dL (ref 7.0–26.0)
CO2: 26 mEq/L (ref 22–29)
Calcium: 9.6 mg/dL (ref 8.4–10.4)
Chloride: 99 mEq/L (ref 98–107)
Creatinine: 1.1 mg/dL (ref 0.7–1.3)
Glucose: 102 mg/dl — ABNORMAL HIGH (ref 70–99)
Potassium: 4.7 mEq/L (ref 3.5–5.1)
Sodium: 132 mEq/L — ABNORMAL LOW (ref 136–145)
Total Bilirubin: 0.27 mg/dL (ref 0.20–1.20)
Total Protein: 7.5 g/dL (ref 6.4–8.3)

## 2012-09-21 LAB — CBC WITH DIFFERENTIAL/PLATELET
BASO%: 0.4 % (ref 0.0–2.0)
Basophils Absolute: 0 10*3/uL (ref 0.0–0.1)
EOS%: 3.6 % (ref 0.0–7.0)
Eosinophils Absolute: 0.1 10*3/uL (ref 0.0–0.5)
HCT: 37.1 % — ABNORMAL LOW (ref 38.4–49.9)
HGB: 12.8 g/dL — ABNORMAL LOW (ref 13.0–17.1)
LYMPH%: 16.7 % (ref 14.0–49.0)
MCH: 30.7 pg (ref 27.2–33.4)
MCHC: 34.6 g/dL (ref 32.0–36.0)
MCV: 88.7 fL (ref 79.3–98.0)
MONO#: 0.3 10*3/uL (ref 0.1–0.9)
MONO%: 11.6 % (ref 0.0–14.0)
NEUT#: 1.9 10*3/uL (ref 1.5–6.5)
NEUT%: 67.7 % (ref 39.0–75.0)
Platelets: 165 10*3/uL (ref 140–400)
RBC: 4.18 10*6/uL — ABNORMAL LOW (ref 4.20–5.82)
RDW: 14 % (ref 11.0–14.6)
WBC: 2.8 10*3/uL — ABNORMAL LOW (ref 4.0–10.3)
lymph#: 0.5 10*3/uL — ABNORMAL LOW (ref 0.9–3.3)

## 2012-09-21 LAB — TSH: TSH: 3.926 u[IU]/mL (ref 0.350–4.500)

## 2012-09-21 MED ORDER — IOHEXOL 300 MG/ML  SOLN
100.0000 mL | Freq: Once | INTRAMUSCULAR | Status: AC | PRN
  Administered 2012-09-21: 100 mL via INTRAVENOUS

## 2012-09-23 ENCOUNTER — Ambulatory Visit (HOSPITAL_BASED_OUTPATIENT_CLINIC_OR_DEPARTMENT_OTHER): Payer: 59 | Admitting: Oncology

## 2012-09-23 ENCOUNTER — Encounter: Payer: Self-pay | Admitting: Oncology

## 2012-09-23 ENCOUNTER — Telehealth: Payer: Self-pay | Admitting: Oncology

## 2012-09-23 VITALS — BP 158/102 | HR 85 | Temp 97.0°F | Resp 20 | Ht 72.0 in | Wt 164.6 lb

## 2012-09-23 DIAGNOSIS — D72819 Decreased white blood cell count, unspecified: Secondary | ICD-10-CM

## 2012-09-23 DIAGNOSIS — C01 Malignant neoplasm of base of tongue: Secondary | ICD-10-CM

## 2012-09-23 DIAGNOSIS — C109 Malignant neoplasm of oropharynx, unspecified: Secondary | ICD-10-CM

## 2012-09-23 DIAGNOSIS — D649 Anemia, unspecified: Secondary | ICD-10-CM

## 2012-09-23 MED ORDER — FLUTICASONE PROPIONATE 50 MCG/ACT NA SUSP: 2.0000 | Freq: Every day | NASAL | Status: DC | PRN

## 2012-09-23 MED ORDER — INFLUENZA VIRUS VACC SPLIT PF IM SUSP
0.5000 mL | Freq: Once | INTRAMUSCULAR | Status: AC
  Administered 2012-09-23: 0.5 mL via INTRAMUSCULAR
  Filled 2012-09-23: qty 0.5

## 2012-09-23 NOTE — Telephone Encounter (Signed)
gv pt appt schedule for may 2014 including ct for 5.6.14.

## 2012-09-23 NOTE — Patient Instructions (Addendum)
*  RADIOLOGY REPORT*  Clinical Data: Oral pharyngeal cancer. Status post radiation and  chemotherapy.  CT NECK WITH CONTRAST  Technique: Multidetector CT imaging of the neck was performed with  intravenous contrast.  Contrast: OMNIPAQUE IOHEXOL 300 MG/ML SOLN  Comparison: CT of the neck 03/18/2012.  Findings: Postradiation changes of the neck are again noted. There  is mild chronic thickening of the mucosa with that there is mild  prominence of mucosa in the oropharynx without a discrete mass  lesion or change. Chronic thickening of the platysma and  hyperdensity of the submandibular glands bilaterally is likely due  to post radiation changes. The vocal cords are midline and  symmetric.  Limited imaging of the brain is unremarkable.  No significant adenopathy is present.  Mild degenerative changes within the cervical spine are stable. No  focal lytic or blastic lesions are present.  Postradiation changes at the lung apices bilaterally are unchanged.  A 5 mm pleural-based nodule in the posterior right upper lobe is  stable since 09/2010.  IMPRESSION:  1. No evidence for residual or recurrent disease in the oropharynx  or neck.  2. Stable postradiation changes of the soft tissues and lung  apices.  3. Stable mild degenerative changes within the cervical spine.

## 2012-09-23 NOTE — Progress Notes (Signed)
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   PAST THERAPY: concurrent cisplatin and radiation between November 05, 2010, and November 26, 2010. He achieved complete  response.   CURRENT THEARPY: Watchful observation.  INTERVAL HISTORY: Lee Barnes 61 y.o. male returns for regular follow up.  He reports doing well.  He is working full time without fatigue.  He has low appetite compared to prior to chemotherapy.  However, he has not had any weight loss.  He is able to eat all kind of foods without restriction.  He still has mild xerostomia.  He denies dysphagia, odynophagia, palpable nodes.    Patient denies fatigue, headache, visual changes, confusion, drenching night sweats, palpable lymph node swelling, mucositis, odynophagia, dysphagia, nausea vomiting, jaundice, chest pain, palpitation, shortness of breath, dyspnea on exertion, productive cough, gum bleeding, epistaxis, hematemesis, hemoptysis, abdominal pain, abdominal swelling, early satiety, melena, hematochezia, hematuria, skin rash, spontaneous bleeding, joint swelling, joint pain, heat or cold intolerance, bowel bladder incontinence, back pain, focal motor weakness, paresthesia, depression, suicidal or homocidal ideation, feeling hopelessness.   Past Medical History  Diagnosis Date  . Diverticulosis   . Allergy to environmental factors   . Depression   . History of radiation therapy 11/05/10-12/26/10    r base tongue,  . Oropharynx cancer 09/2010    History reviewed. No pertinent past surgical history.  Current Outpatient Prescriptions  Medication Sig Dispense Refill  . fluticasone (FLONASE) 50 MCG/ACT nasal spray Place 2 sprays into the nose daily as needed.  16 g  2  . [DISCONTINUED] fluticasone (FLONASE) 50 MCG/ACT nasal spray Place 2  sprays into the nose daily as needed.       Current Facility-Administered Medications  Medication Dose Route Frequency Provider Last Rate Last Dose  . [COMPLETED] influenza  inactive virus vaccine (FLUZONE/FLUARIX) injection 0.5 mL  0.5 mL Intramuscular Once Myrtis Ser, NP   0.5 mL at 09/23/12 1129    ALLERGIES:  is allergic to codeine.  REVIEW OF SYSTEMS:  The rest of the 14-point review of system was negative.   Filed Vitals:   09/23/12 1109  BP: 158/102  Pulse: 85  Temp: 97 F (36.1 C)  Resp: 20   Wt Readings from Last 3 Encounters:  09/23/12 164 lb 9.6 oz (74.662 kg)  07/28/12 159 lb 1.6 oz (72.167 kg)  03/20/12 162 lb 14.4 oz (73.891 kg)   ECOG Performance status: 0  PHYSICAL EXAMINATION:   General:  Thin-appearing man, in no acute distress.  Eyes:  no scleral icterus.  ENT:  There were no oropharyngeal lesions.  Neck was without thyromegaly.  Lymphatics:  Negative cervical, supraclavicular or axillary adenopathy.  Respiratory: lungs were clear bilaterally without wheezing or crackles.  Cardiovascular:  Regular rate and rhythm, S1/S2, without murmur, rub or gallop.  There was no pedal edema.  GI:  abdomen was soft, flat, nontender, nondistended, without organomegaly.  Muscoloskeletal:  no spinal tenderness of palpation of vertebral spine.  Skin exam was without echymosis, petichae.  Neuro exam was nonfocal.  Patient was able to get on and off exam table without assistance.  Gait was normal.  Patient was alerted and oriented.  Attention was good.   Language was appropriate.  Mood was normal without depression.  Speech was not pressured.  Thought content was not tangential.  LABORATORY/RADIOLOGY DATA:  Lab Results  Component Value Date   WBC 2.8* 09/21/2012   HGB 12.8* 09/21/2012   HCT 37.1* 09/21/2012   PLT 165 09/21/2012   GLUCOSE 102* 09/21/2012   ALKPHOS 103 09/21/2012   ALT 12 09/21/2012   AST 17 09/21/2012   NA 132* 09/21/2012   K 4.7 09/21/2012   CL 99 09/21/2012     CREATININE 1.1 09/21/2012   BUN 10.0 09/21/2012   CO2 26 09/21/2012    RADIOLOGY:    *RADIOLOGY REPORT*  Clinical Data: Oral pharyngeal cancer. Status post radiation and  chemotherapy.  CT NECK WITH CONTRAST  Technique: Multidetector CT imaging of the neck was performed with  intravenous contrast.  Contrast: OMNIPAQUE IOHEXOL 300 MG/ML SOLN  Comparison: CT of the neck 03/18/2012.  Findings: Postradiation changes of the neck are again noted. There  is mild chronic thickening of the mucosa with that there is mild  prominence of mucosa in the oropharynx without a discrete mass  lesion or change. Chronic thickening of the platysma and  hyperdensity of the submandibular glands bilaterally is likely due  to post radiation changes. The vocal cords are midline and  symmetric.  Limited imaging of the brain is unremarkable.  No significant adenopathy is present.  Mild degenerative changes within the cervical spine are stable. No  focal lytic or blastic lesions are present.  Postradiation changes at the lung apices bilaterally are unchanged.  A 5 mm pleural-based nodule in the posterior right upper lobe is  stable since 09/2010.  IMPRESSION:  1. No evidence for residual or recurrent disease in the oropharynx  or neck.  2. Stable postradiation changes of the soft tissues and lung  apices.  3. Stable mild degenerative changes within the cervical spine.  Original Report Authenticated By: Marin Roberts, M.D.   ASSESSMENT AND PLAN:   1. 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 I discussed it with the patient that there was no evidence of residual disease or metastatic disease on today clinical history, physical exam, and CT.   2. Mild leukopenia and anemia:  Most likely residual effects from chemo.  Cisplatin is not commonly known to cause MDS/AML.  I will continue to monitor his CBC and may consider bone marrow biopsy when ANC <1.5 or he develops  pancytopenia.  3. Slight depression: he seems to adjust well today.  4. Calorie-protein malnutrition. His weight is stable. He has no restriction anymore with his diet.   5. Primary care: His last colonoscopy in 2011 with [facility] was negative per his report. He is not smoking nor is he drinking heavily. The most he drinks is maybe one-two glasses of wine a month. Flu vaccine given today. 6. Followup with CT neck in about 6 months. I advised him to follow with ENT, Dr. Ezzard Standing and with radiation oncology, Dr. Dayton Scrape between visits with [place] here.     The length of time of the face-to-face encounter was . More than 50% of time was spent counseling and coordination of care.

## 2012-11-12 ENCOUNTER — Encounter: Payer: Self-pay | Admitting: Radiation Oncology

## 2012-11-24 ENCOUNTER — Ambulatory Visit: Admission: RE | Admit: 2012-11-24 | Payer: 59 | Source: Ambulatory Visit | Admitting: Radiation Oncology

## 2013-01-19 ENCOUNTER — Ambulatory Visit
Admission: RE | Admit: 2013-01-19 | Discharge: 2013-01-19 | Disposition: A | Discharge status: Home / Self Care | Payer: 59 | Source: Ambulatory Visit | Attending: Radiation Oncology | Admitting: Radiation Oncology

## 2013-01-19 ENCOUNTER — Encounter: Payer: Self-pay | Admitting: Radiation Oncology

## 2013-01-19 VITALS — BP 154/86 | HR 84 | Temp 98.6°F | Wt 163.5 lb

## 2013-01-19 DIAGNOSIS — C109 Malignant neoplasm of oropharynx, unspecified: Secondary | ICD-10-CM

## 2013-01-19 NOTE — Progress Notes (Signed)
Lee Barnes is here for his follow up visit after 35 fractions to his tongue.  He does have dry mouth at night for which he uses biotene.  He denies pain at this time.  He is alert and oriented x 3.

## 2013-01-19 NOTE — Progress Notes (Signed)
CC: Dr. Narda Bonds   Followup note:  Lee Barnes returns today approximately 2 years and 1 month following completion of chemoradiation in the management of his stage TI N2B squamous cell carcinoma of the right base of tongue. He is without new complaints today. His taste has returned to normal and he still has some residual xerostomia. He saw Dr. Ezzard Standing this past January and was given a good report. He will see him again in June. He visited Dr. Lodema Pilot PA last November after CT scan on November 13 which was without evidence for recurrent disease. He is scheduled for a followup CT scan with Dr. Gaylyn Rong this May. His also scheduled for a TSH in May. He is keeping up with his dentist.  Physical examination: He looks well. Wt Readings from Last 3 Encounters:  01/19/13 163 lb 8 oz (74.163 kg)  09/23/12 164 lb 9.6 oz (74.662 kg)  07/28/12 159 lb 1.6 oz (72.167 kg)   Temp Readings from Last 3 Encounters:  01/19/13 98.6 F (37 C)   09/23/12 97 F (36.1 C) Oral  07/28/12 97.7 F (36.5 C) Oral   BP Readings from Last 3 Encounters:  01/19/13 154/86  09/23/12 158/102  07/28/12 153/77   Pulse Readings from Last 3 Encounters:  01/19/13 84  09/23/12 85  07/28/12 74   Head and neck examination: There is no palpable lymphadenopathy in the neck. Oral cavity and oropharynx remarkable for mild xerostomia. Indirect mirror examination without evidence for recurrent disease along his tongue base. Endolarynx appears normal and both true vocal cords oppose one another at the midline. Palpation of the oropharynx without evidence for recurrent disease.  Lab data: Lab Results  Component Value Date   WBC 2.8* 09/21/2012   HGB 12.8* 09/21/2012   HCT 37.1* 09/21/2012   MCV 88.7 09/21/2012   PLT 165 09/21/2012   CMP     Component Value Date/Time   NA 132* 09/21/2012 0757   NA 139 03/18/2012 0836   NA 130* 03/27/2011 0850   K 4.7 09/21/2012 0757   K 4.6 03/18/2012 0836   K 5.4* 03/27/2011 0850   CL 99 09/21/2012 0757   CL 97* 03/18/2012 0836   CL 95* 03/27/2011 0850   CO2 26 09/21/2012 0757   CO2 28 03/18/2012 0836   CO2 22 03/27/2011 0850   GLUCOSE 102* 09/21/2012 0757   GLUCOSE 106 03/18/2012 0836   GLUCOSE 98 03/27/2011 0850   BUN 10.0 09/21/2012 0757   BUN 12 03/18/2012 0836   BUN 18 03/27/2011 0850   CREATININE 1.1 09/21/2012 0757   CREATININE 1.2 03/18/2012 0836   CREATININE 1.01 03/27/2011 0850   CALCIUM 9.6 09/21/2012 0757   CALCIUM 9.0 03/18/2012 0836   CALCIUM 9.6 03/27/2011 0850   PROT 7.5 09/21/2012 0757   PROT 7.2 03/18/2012 0836   PROT 6.8 03/27/2011 0850   ALBUMIN 4.0 09/21/2012 0757   ALBUMIN 4.3 03/27/2011 0850   AST 17 09/21/2012 0757   AST 24 03/18/2012 0836   AST 18 03/27/2011 0850   ALT 12 09/21/2012 0757   ALT 14 03/27/2011 0850   ALKPHOS 103 09/21/2012 0757   ALKPHOS 99* 03/18/2012 0836   ALKPHOS 100 03/27/2011 0850   BILITOT 0.27 09/21/2012 0757   BILITOT 0.80 03/18/2012 0836   BILITOT 0.3 03/27/2011 0850   GFRNONAA >60 12/18/2010 0415   GFRAA  Value: >60        The eGFR has been calculated using the MDRD equation. This calculation has not been  validated in all clinical situations. eGFR's persistently <60 mL/min signify possible Chronic Kidney Disease. 12/18/2010 0415     Impression: No evidence for recurrent disease.   Plan: Followup visit with Dr. Ezzard Standing this June, I will see him back in September (6 months). He is to see Dr. Gaylyn Rong this May after his CT scan. He is scheduled for a TSH and this May.

## 2013-03-22 ENCOUNTER — Other Ambulatory Visit: Payer: 59 | Admitting: Lab

## 2013-03-23 ENCOUNTER — Other Ambulatory Visit (HOSPITAL_BASED_OUTPATIENT_CLINIC_OR_DEPARTMENT_OTHER): Payer: BC Managed Care – PPO

## 2013-03-23 ENCOUNTER — Ambulatory Visit (HOSPITAL_COMMUNITY)
Admission: RE | Admit: 2013-03-23 | Discharge: 2013-03-23 | Disposition: A | Discharge status: Home / Self Care | Payer: BC Managed Care – PPO | Source: Ambulatory Visit | Attending: Oncology | Admitting: Oncology

## 2013-03-23 ENCOUNTER — Encounter (HOSPITAL_COMMUNITY): Payer: Self-pay

## 2013-03-23 DIAGNOSIS — T66XXXA Radiation sickness, unspecified, initial encounter: Secondary | ICD-10-CM | POA: Insufficient documentation

## 2013-03-23 DIAGNOSIS — M47812 Spondylosis without myelopathy or radiculopathy, cervical region: Secondary | ICD-10-CM | POA: Insufficient documentation

## 2013-03-23 DIAGNOSIS — Z9221 Personal history of antineoplastic chemotherapy: Secondary | ICD-10-CM | POA: Insufficient documentation

## 2013-03-23 DIAGNOSIS — Y842 Radiological procedure and radiotherapy as the cause of abnormal reaction of the patient, or of later complication, without mention of misadventure at the time of the procedure: Secondary | ICD-10-CM | POA: Insufficient documentation

## 2013-03-23 DIAGNOSIS — Z923 Personal history of irradiation: Secondary | ICD-10-CM | POA: Insufficient documentation

## 2013-03-23 DIAGNOSIS — C109 Malignant neoplasm of oropharynx, unspecified: Secondary | ICD-10-CM | POA: Insufficient documentation

## 2013-03-23 DIAGNOSIS — C01 Malignant neoplasm of base of tongue: Secondary | ICD-10-CM

## 2013-03-23 DIAGNOSIS — R234 Changes in skin texture: Secondary | ICD-10-CM | POA: Insufficient documentation

## 2013-03-23 LAB — COMPREHENSIVE METABOLIC PANEL (CC13)
ALT: 13 U/L (ref 0–55)
AST: 22 U/L (ref 5–34)
Albumin: 3.8 g/dL (ref 3.5–5.0)
Alkaline Phosphatase: 110 U/L (ref 40–150)
BUN: 14.4 mg/dL (ref 7.0–26.0)
CO2: 26 mEq/L (ref 22–29)
Calcium: 9.4 mg/dL (ref 8.4–10.4)
Chloride: 99 mEq/L (ref 98–107)
Creatinine: 1.1 mg/dL (ref 0.7–1.3)
Glucose: 94 mg/dl (ref 70–99)
Potassium: 5.1 mEq/L (ref 3.5–5.1)
Sodium: 133 mEq/L — ABNORMAL LOW (ref 136–145)
Total Bilirubin: 0.39 mg/dL (ref 0.20–1.20)
Total Protein: 7.5 g/dL (ref 6.4–8.3)

## 2013-03-23 LAB — CBC WITH DIFFERENTIAL/PLATELET
BASO%: 0.2 % (ref 0.0–2.0)
Basophils Absolute: 0 10*3/uL (ref 0.0–0.1)
EOS%: 2.7 % (ref 0.0–7.0)
Eosinophils Absolute: 0.1 10*3/uL (ref 0.0–0.5)
HCT: 36.1 % — ABNORMAL LOW (ref 38.4–49.9)
HGB: 12.4 g/dL — ABNORMAL LOW (ref 13.0–17.1)
LYMPH%: 15.7 % (ref 14.0–49.0)
MCH: 29.5 pg (ref 27.2–33.4)
MCHC: 34.5 g/dL (ref 32.0–36.0)
MCV: 85.5 fL (ref 79.3–98.0)
MONO#: 0.4 10*3/uL (ref 0.1–0.9)
MONO%: 11.7 % (ref 0.0–14.0)
NEUT#: 2.5 10*3/uL (ref 1.5–6.5)
NEUT%: 69.7 % (ref 39.0–75.0)
Platelets: 200 10*3/uL (ref 140–400)
RBC: 4.22 10*6/uL (ref 4.20–5.82)
RDW: 13.7 % (ref 11.0–14.6)
WBC: 3.6 10*3/uL — ABNORMAL LOW (ref 4.0–10.3)
lymph#: 0.6 10*3/uL — ABNORMAL LOW (ref 0.9–3.3)

## 2013-03-23 LAB — TSH: TSH: 2.932 u[IU]/mL (ref 0.350–4.500)

## 2013-03-23 MED ORDER — IOHEXOL 300 MG/ML  SOLN
100.0000 mL | Freq: Once | INTRAMUSCULAR | Status: AC | PRN
  Administered 2013-03-23: 100 mL via INTRAVENOUS

## 2013-03-23 NOTE — Patient Instructions (Addendum)
1.  History of right base of the tongue cancer. 2.  Status:  Continue to be in remission. 3.  Recommendation:  Monitoring clinically with ENT evaluation at least once a year and reserved CT scan if there are concerning symptoms or findings.  Return visit in about 1 year with the [facility].

## 2013-03-24 ENCOUNTER — Telehealth: Payer: Self-pay | Admitting: Oncology

## 2013-03-24 ENCOUNTER — Ambulatory Visit (HOSPITAL_BASED_OUTPATIENT_CLINIC_OR_DEPARTMENT_OTHER): Payer: BC Managed Care – PPO | Admitting: Oncology

## 2013-03-24 VITALS — BP 151/91 | HR 80 | Temp 97.0°F | Resp 18 | Ht 72.0 in | Wt 163.9 lb

## 2013-03-24 DIAGNOSIS — C109 Malignant neoplasm of oropharynx, unspecified: Secondary | ICD-10-CM

## 2013-03-24 NOTE — Progress Notes (Signed)
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   PAST THERAPY: concurrent cisplatin and radiation between November 05, 2010, and November 26, 2010. He achieved complete  response.   CURRENT THEARPY: Watchful observation.  INTERVAL HISTORY: Lee Barnes 62 y.o. male returns for regular follow up by himself. He works full time. He denies any problem. He has mild dry mouth; however, he is able to eat any kind of food without problem. He has mild tugging of the neck when he turns his head and extreme ankles.  Patient denies fever, anorexia, weight loss, fatigue, headache, visual changes, confusion, drenching night sweats, palpable lymph node swelling, mucositis, odynophagia, dysphagia, nausea vomiting, jaundice, chest pain, palpitation, shortness of breath, dyspnea on exertion, productive cough, gum bleeding, epistaxis, hematemesis, hemoptysis, abdominal pain, abdominal swelling, early satiety, melena, hematochezia, hematuria, skin rash, spontaneous bleeding, joint swelling, joint pain, heat or cold intolerance, bowel bladder incontinence, back pain, focal motor weakness, paresthesia, depression.   Past Medical History  Diagnosis Date  . Diverticulosis   . Allergy to environmental factors   . Depression   . History of radiation therapy 11/05/10-12/26/10    r base tongue,  . 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  Current Outpatient Prescriptions  Medication Sig Dispense Refill  . aspirin 81 MG tablet Take 81 mg by mouth as needed for pain.      . fluticasone (FLONASE) 50 MCG/ACT nasal spray Place 2 sprays into the nose daily as needed.  16 g  2   No current facility-administered medications for this visit.    ALLERGIES:  is allergic to codeine.  REVIEW OF SYSTEMS:  The rest of the  14-point review of system was negative.   Filed Vitals:   03/24/13 0919  BP: 151/91  Pulse: 80  Temp: 97 F (36.1 C)  Resp: 18   Wt Readings from Last 3 Encounters:  03/24/13 163 lb 14.4 oz (74.345 kg)  01/19/13 163 lb 8 oz (74.163 kg)  09/23/12 164 lb 9.6 oz (74.662 kg)   ECOG Performance status: 0  PHYSICAL EXAMINATION:   General:  Thin-appearing man, in no acute distress.  Eyes:  no scleral icterus.  ENT:  There were no oropharyngeal lesions.  Neck was without thyromegaly.  Lymphatics:  Negative cervical, supraclavicular or axillary adenopathy.  Respiratory: lungs were clear bilaterally without wheezing or crackles.  Cardiovascular:  Regular rate and rhythm, S1/S2, without murmur, rub or gallop.  There was no pedal edema.  GI:  abdomen was soft, flat, nontender, nondistended, without organomegaly.  Muscoloskeletal:  no spinal tenderness of palpation of vertebral spine.  Skin exam was without echymosis, petichae.  Neuro exam was nonfocal.  Patient was able to get on and off exam table without assistance.  Gait was normal.  Patient was alert and oriented.  Attention was good.   Language was appropriate.  Mood was normal without depression.  Speech was not pressured.  Thought content was not tangential.     LABORATORY/RADIOLOGY DATA:  Lab Results  Component Value Date   WBC 3.6* 03/23/2013   HGB 12.4* 03/23/2013   HCT 36.1* 03/23/2013   PLT 200 03/23/2013   GLUCOSE 94 03/23/2013   ALKPHOS 110 03/23/2013   ALT 13 03/23/2013   AST 22 03/23/2013   NA 133* 03/23/2013   K  5.1 03/23/2013   CL 99 03/23/2013   CREATININE 1.1 03/23/2013   BUN 14.4 03/23/2013   CO2 26 03/23/2013    RADIOLOGY:   I personally reviewed the following CT and showed the patient the images.  In brief, there was no evidence of recurrent disease.     CT NECK WITH CONTRAST  Technique: Multidetector CT imaging of the neck was performed with  intravenous contrast.  Contrast: OMNIPAQUE IOHEXOL 300 MG/ML SOLN  Comparison: CT neck  09/21/2012  Findings: Post radiation changes in the neck with mild skin  thickening. There is radiation change in the lung apices  bilaterally which is stable. 4 mm right apical subpleural density  is stable  Paranasal sinuses are clear. The oropharynx and tongue are normal.  No mass or asymmetry is present. Epiglottis and larynx are normal.  Postradiation changes in the submandibular glands and parotid  glands bilaterally. No focal mass. Thyroid normal in size.  No pathologic adenopathy in the neck.  Cervical spondylosis C5-6 and C6-7 without evidence of acute bony  lesion.  IMPRESSION:  Stable CT neck. No recurrent tumor or adenopathy.  Original Report Authenticated By: Lee Barnes, M.D.   ASSESSMENT AND PLAN:   1. 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 Continues to be in remission. As he is more than 2 years out from the finish of therapy and he is asymptomatic, there is no indication for routine CT scan unless he has symptoms or concerning findings on exam.  He expressed informed understanding wished to proceed with watchful observation. He does not smoke cigarettes, chew tobacco, or drink alcohol.  2. Mild leukopenia and anemia:  Most likely residual effects from chemo.  I have low suspicion for a bone marrow failure state at this time. We'll continue to observe and monitor. 3. History of depression: He seems to be in good mood today. 4. Primary care: His last colonoscopy in 2011 with [facility] was negative per his report. 5. Followup in about one year. I advised him to follow with ENT, Dr. Ezzard Barnes and with radiation oncology, Dr. Dayton Barnes between visits with [place] here.   I informed Lee Barnes that I am leaving the practice. The cancer Barnes will arrange for him to followup with a new oncologist when he returns.  The length of time of the face-to-face encounter was . More than 50% of time was spent counseling and coordination of care.      Lee  T. Gaylyn Barnes, M.D.

## 2013-03-24 NOTE — Telephone Encounter (Signed)
Gave pt appt for lab and Md on  May 2015 °

## 2013-07-20 ENCOUNTER — Ambulatory Visit: Payer: BC Managed Care – PPO | Admitting: Radiation Oncology

## 2013-08-27 ENCOUNTER — Encounter: Payer: Self-pay | Admitting: *Deleted

## 2013-08-31 ENCOUNTER — Ambulatory Visit
Admission: RE | Admit: 2013-08-31 | Discharge: 2013-08-31 | Disposition: A | Discharge status: Home / Self Care | Payer: BC Managed Care – PPO | Source: Ambulatory Visit | Attending: Radiation Oncology | Admitting: Radiation Oncology

## 2013-08-31 ENCOUNTER — Encounter: Payer: Self-pay | Admitting: Radiation Oncology

## 2013-08-31 VITALS — BP 165/88 | HR 78 | Temp 98.6°F | Resp 20 | Wt 166.9 lb

## 2013-08-31 DIAGNOSIS — C109 Malignant neoplasm of oropharynx, unspecified: Secondary | ICD-10-CM

## 2013-08-31 NOTE — Progress Notes (Signed)
CC: Dr. Narda Bonds  Followup note: Lee Barnes returns today approximately 2 years and 8 months following completion of chemoradiation in the management of his stage TI N2B squamous cell carcinoma the right base of tongue. He is without complaints today. He saw Dr. Gaylyn Rong this past May and had a followup CT scan which was without evidence for recurrent disease. His TSH was within normal limits. He saw Dr. Ezzard Standing this past July he was given a good report. He sees his dentist every 4 months. He denies significant xerostomia.  Physical examination: Alert and oriented. Filed Vitals:   08/31/13 1019  BP: 165/88  Pulse: 78  Temp: 98.6 F (37 C)  Resp: 20   Nodes: There is no palpable lymphadenopathy in the neck. Oral cavity: The tongue deviates slightly to the right with protrusion. Oral cavity unremarkable to inspection. Oropharynx: Unremarkable to inspection. Indirect mirror examination without evidence for recurrent disease. This is confirmed by palpation. Endolarynx unremarkable to inspection. Cranial nerves 2-12 intact with the exception of slight right tongue deviation.  Laboratory data: Lab Results  Component Value Date   WBC 3.6* 03/23/2013   HGB 12.4* 03/23/2013   HCT 36.1* 03/23/2013   MCV 85.5 03/23/2013   PLT 200 03/23/2013   CMP     Component Value Date/Time   NA 133* 03/23/2013 1004   NA 139 03/18/2012 0836   NA 130* 03/27/2011 0850   K 5.1 03/23/2013 1004   K 4.6 03/18/2012 0836   K 5.4* 03/27/2011 0850   CL 99 03/23/2013 1004   CL 97* 03/18/2012 0836   CL 95* 03/27/2011 0850   CO2 26 03/23/2013 1004   CO2 28 03/18/2012 0836   CO2 22 03/27/2011 0850   GLUCOSE 94 03/23/2013 1004   GLUCOSE 106 03/18/2012 0836   GLUCOSE 98 03/27/2011 0850   BUN 14.4 03/23/2013 1004   BUN 12 03/18/2012 0836   BUN 18 03/27/2011 0850   CREATININE 1.1 03/23/2013 1004   CREATININE 1.2 03/18/2012 0836   CREATININE 1.01 03/27/2011 0850   CALCIUM 9.4 03/23/2013 1004   CALCIUM 9.0 03/18/2012 0836   CALCIUM 9.6 03/27/2011 0850   PROT 7.5  03/23/2013 1004   PROT 7.2 03/18/2012 0836   PROT 6.8 03/27/2011 0850   ALBUMIN 3.8 03/23/2013 1004   ALBUMIN 4.3 03/27/2011 0850   AST 22 03/23/2013 1004   AST 24 03/18/2012 0836   AST 18 03/27/2011 0850   ALT 13 03/23/2013 1004   ALT 18 03/18/2012 0836   ALT 14 03/27/2011 0850   ALKPHOS 110 03/23/2013 1004   ALKPHOS 99* 03/18/2012 0836   ALKPHOS 100 03/27/2011 0850   BILITOT 0.39 03/23/2013 1004   BILITOT 0.80 03/18/2012 0836   BILITOT 0.3 03/27/2011 0850   GFRNONAA >60 12/18/2010 0415   GFRAA  Value: >60        The eGFR has been calculated using the MDRD equation. This calculation has not been validated in all clinical situations. eGFR's persistently <60 mL/min signify possible Chronic Kidney Disease. 12/18/2010 0415    Impression: Satisfactory progress with no evidence for recurrent disease. I suspect that his slight right tongue deviation on protrusion of the tongue is secondary to scarring along his right tongue base rather than recurrent disease.  Plan: He'll see Dr. Ezzard Standing early next year and I'll see him in approximately 6-7 months. He'll see Dr. Bertis Ruddy in May 2015 for medical oncology followup.

## 2013-08-31 NOTE — Progress Notes (Signed)
Pt denies pain, loss of appetite, fatigue. He is able to eat any foods he desires, has rare dry mouth for which he uses Biotene.

## 2013-12-13 ENCOUNTER — Other Ambulatory Visit: Payer: BC Managed Care – PPO

## 2013-12-13 ENCOUNTER — Encounter: Payer: BC Managed Care – PPO | Admitting: Family Medicine

## 2013-12-13 DIAGNOSIS — Z0000 Encounter for general adult medical examination without abnormal findings: Secondary | ICD-10-CM

## 2013-12-13 LAB — CBC WITH DIFFERENTIAL/PLATELET
Basophils Absolute: 0 10*3/uL (ref 0.0–0.1)
Basophils Relative: 0 % (ref 0–1)
Eosinophils Absolute: 0.2 10*3/uL (ref 0.0–0.7)
Eosinophils Relative: 4 % (ref 0–5)
HCT: 37.3 % — ABNORMAL LOW (ref 39.0–52.0)
Hemoglobin: 12.9 g/dL — ABNORMAL LOW (ref 13.0–17.0)
Lymphocytes Relative: 14 % (ref 12–46)
Lymphs Abs: 0.6 10*3/uL — ABNORMAL LOW (ref 0.7–4.0)
MCH: 29.3 pg (ref 26.0–34.0)
MCHC: 34.6 g/dL (ref 30.0–36.0)
MCV: 84.6 fL (ref 78.0–100.0)
Monocytes Absolute: 0.5 10*3/uL (ref 0.1–1.0)
Monocytes Relative: 12 % (ref 3–12)
Neutro Abs: 2.9 10*3/uL (ref 1.7–7.7)
Neutrophils Relative %: 70 % (ref 43–77)
Platelets: 199 10*3/uL (ref 150–400)
RBC: 4.41 MIL/uL (ref 4.22–5.81)
RDW: 14.2 % (ref 11.5–15.5)
WBC: 4.1 10*3/uL (ref 4.0–10.5)

## 2013-12-14 ENCOUNTER — Encounter: Payer: Self-pay | Admitting: Family Medicine

## 2013-12-14 ENCOUNTER — Other Ambulatory Visit: Payer: BC Managed Care – PPO

## 2013-12-14 ENCOUNTER — Ambulatory Visit (INDEPENDENT_AMBULATORY_CARE_PROVIDER_SITE_OTHER): Payer: BC Managed Care – PPO | Admitting: Family Medicine

## 2013-12-14 VITALS — BP 140/96 | HR 60 | Ht 72.0 in | Wt 173.0 lb

## 2013-12-14 DIAGNOSIS — K5730 Diverticulosis of large intestine without perforation or abscess without bleeding: Secondary | ICD-10-CM

## 2013-12-14 DIAGNOSIS — C109 Malignant neoplasm of oropharynx, unspecified: Secondary | ICD-10-CM

## 2013-12-14 DIAGNOSIS — R195 Other fecal abnormalities: Secondary | ICD-10-CM

## 2013-12-14 DIAGNOSIS — J309 Allergic rhinitis, unspecified: Secondary | ICD-10-CM

## 2013-12-14 DIAGNOSIS — K5790 Diverticulosis of intestine, part unspecified, without perforation or abscess without bleeding: Secondary | ICD-10-CM

## 2013-12-14 DIAGNOSIS — N529 Male erectile dysfunction, unspecified: Secondary | ICD-10-CM

## 2013-12-14 DIAGNOSIS — Z0000 Encounter for general adult medical examination without abnormal findings: Secondary | ICD-10-CM

## 2013-12-14 DIAGNOSIS — Z2911 Encounter for prophylactic immunotherapy for respiratory syncytial virus (RSV): Secondary | ICD-10-CM

## 2013-12-14 LAB — COMPREHENSIVE METABOLIC PANEL
ALT: 14 U/L (ref 0–53)
AST: 19 U/L (ref 0–37)
Albumin: 4.3 g/dL (ref 3.5–5.2)
Alkaline Phosphatase: 97 U/L (ref 39–117)
BUN: 13 mg/dL (ref 6–23)
CO2: 26 mEq/L (ref 19–32)
Calcium: 9.3 mg/dL (ref 8.4–10.5)
Chloride: 99 mEq/L (ref 96–112)
Creat: 1.04 mg/dL (ref 0.50–1.35)
Glucose, Bld: 88 mg/dL (ref 70–99)
Potassium: 5.1 mEq/L (ref 3.5–5.3)
Sodium: 134 mEq/L — ABNORMAL LOW (ref 135–145)
Total Bilirubin: 0.5 mg/dL (ref 0.3–1.2)
Total Protein: 7.3 g/dL (ref 6.0–8.3)

## 2013-12-14 LAB — POCT URINALYSIS DIPSTICK
Bilirubin, UA: NEGATIVE
Blood, UA: NEGATIVE
Glucose, UA: NEGATIVE
Ketones, UA: NEGATIVE
Leukocytes, UA: NEGATIVE
Nitrite, UA: NEGATIVE
Protein, UA: NEGATIVE
Spec Grav, UA: 1.01
Urobilinogen, UA: NEGATIVE
pH, UA: 7

## 2013-12-14 LAB — HEMOCCULT GUIAC POC 1CARD (OFFICE)

## 2013-12-14 LAB — LIPID PANEL
Cholesterol: 184 mg/dL (ref 0–200)
HDL: 47 mg/dL (ref >39)
LDL Cholesterol: 115 mg/dL — ABNORMAL HIGH (ref 0–99)
Total CHOL/HDL Ratio: 3.9 Ratio
Triglycerides: 109 mg/dL (ref <150)
VLDL: 22 mg/dL (ref 0–40)

## 2013-12-14 MED ORDER — AVANAFIL 100 MG PO TABS: 1.0000 | ORAL_TABLET | ORAL | Status: DC

## 2013-12-14 MED ORDER — FLUTICASONE PROPIONATE 50 MCG/ACT NA SUSP: 2.0000 | Freq: Every day | NASAL | Status: DC | PRN

## 2013-12-14 NOTE — Patient Instructions (Signed)
Cut back on fluids in the evening and make sure you and to your bladder before you go to bed. Let me know how this works and if we need to pursue this further we will. Also let me know if you need a refill on the [place]

## 2013-12-14 NOTE — Progress Notes (Signed)
Subjective:    Patient ID: Lee Barnes, male    DOB: 06-27-1951, 63 y.o.   MRN: [id number]  HPI He is here for complete examination. He does have a history of oropharyngeal cancer and continues to be followed by radiation oncology. He does have underlying allergies and would like a refill on his Flonase. Does have a previous history of diverticulosis and apparently had a colonoscopy 6 or 7 years ago. He also complains of erectile dysfunction, having difficulty maintaining an erection. His libido and energy as well as stamina are good. He does have difficulty with nocturia and date cystoscopy when he had a G-tube placed when he was getting radiation therapy to his throat and neck area. He states he gets up every couple of hours. He does complain of slight decreased stream but no hesitancy or feeling of incomplete emptying. Review of record indicates depression however he states that he was never treated nor was involved in any counseling and presently he is doing quite well.   Review of Systems  HENT: Negative.   Eyes: Negative.   Respiratory: Negative.   Cardiovascular: Negative.   Endocrine: Negative.   Genitourinary: Negative.   Allergic/Immunologic: Negative.   Neurological: Negative.   Hematological: Negative.   Psychiatric/Behavioral: Negative.        Objective:   Physical Exam BP 140/96  Pulse 60  Ht 6' (1.829 m)  Wt 173 lb (78.472 kg)  BMI 23.46 kg/m2  General Appearance:    Alert, cooperative, no distress, appears stated age  Head:    Normocephalic, without obvious abnormality, atraumatic  Eyes:    PERRL, conjunctiva/corneas clear, EOM's intact, fundi    benign  Ears:    Normal TM's and external ear canals  Nose:   Nares normal, mucosa normal, no drainage or sinus   tenderness  Throat:   Lips, mucosa, and tongue normal; teeth and gums normal  Neck:   Supple, no lymphadenopathy;  thyroid:  no   enlargement/tenderness/nodules; no carotid   bruit or JVD  Back:     Spine nontender, no curvature, ROM normal, no CVA     tenderness  Lungs:     Clear to auscultation bilaterally without wheezes, rales or     ronchi; respirations unlabored  Chest Wall:    No tenderness or deformity   Heart:    Regular rate and rhythm, S1 and S2 normal, no murmur, rub   or gallop  Breast Exam:    No chest wall tenderness, masses or gynecomastia  Abdomen:     Soft, non-tender, nondistended, normoactive bowel sounds,    no masses, no hepatosplenomegaly  Genitalia:   deferred   Rectal:    Normal sphincter tone, no masses or tenderness; guaiac positive stool.  Prostate smooth, no nodules, slightly enlarged.  Extremities:   No clubbing, cyanosis or edema  Pulses:   2+ and symmetric all extremities  Skin:   Skin color, texture, turgor normal, no rashes or lesions  Lymph nodes:   Cervical, supraclavicular, and axillary nodes normal  Neurologic:   CNII-XII intact, normal strength, sensation and gait; reflexes 2+ and symmetric throughout          Psych:   Normal mood, affect, hygiene and grooming.   Blood work was reviewed.       Assessment & Plan:  Routine general medical examination at a health care facility - Plan: Urinalysis Dipstick, Varicella-zoster vaccine subcutaneous, Hemoccult - 1 Card (office)  Oropharynx cancer  Diverticulosis  ED (erectile  dysfunction) - Plan: Avanafil (STENDRA) 100 MG TABS  Allergic rhinitis - Plan: fluticasone (FLONASE) 50 MCG/ACT nasal spray  Stool guaiac positive - Plan: Ambulatory referral to Gastroenterology  I discussed the use of Stendra. Recommend he use is roughly an hour before any activity. Explained that he might not need this on a regular basis. Also discussed side effects.

## 2013-12-17 ENCOUNTER — Encounter: Payer: Self-pay | Admitting: Gastroenterology

## 2014-01-17 ENCOUNTER — Ambulatory Visit (INDEPENDENT_AMBULATORY_CARE_PROVIDER_SITE_OTHER): Payer: BC Managed Care – PPO | Admitting: Gastroenterology

## 2014-01-17 ENCOUNTER — Encounter: Payer: Self-pay | Admitting: Gastroenterology

## 2014-01-17 VITALS — BP 164/98 | HR 76 | Ht 72.0 in | Wt 171.4 lb

## 2014-01-17 DIAGNOSIS — R195 Other fecal abnormalities: Secondary | ICD-10-CM

## 2014-01-17 DIAGNOSIS — R109 Unspecified abdominal pain: Secondary | ICD-10-CM

## 2014-01-17 MED ORDER — PEG-KCL-NACL-NASULF-NA ASC-C 100 G PO SOLR: 1.0000 | Freq: Once | ORAL | Status: DC

## 2014-01-17 NOTE — Patient Instructions (Signed)
You have been scheduled for a colonoscopy with propofol. Please follow written instructions given to you at your visit today.  Please pick up your prep kit at the pharmacy within the next 1-3 days. If you use inhalers (even only as needed), please bring them with you on the day of your procedure.  Thank you for choosing me and [facility].  Malcolm T. Stark, Jr., MD., FACG    

## 2014-01-17 NOTE — Progress Notes (Signed)
    History of Present Illness: This is a 63 year old male who relates occasional lower abdominal discomfort that feels like gas moving across his lower abdomen. He was found to have occult blood in his stool by his PCP. He previously underwent colonoscopy in 2009 showing internal hemorrhoids and sigmoid colon diverticulosis. Denies weight loss, constipation, diarrhea, change in stool caliber, melena, hematochezia, nausea, vomiting, dysphagia, reflux symptoms, chest pain.  Review of Systems: Pertinent positive and negative review of systems were noted in the above HPI section. All other review of systems were otherwise negative.  Current Medications, Allergies, Past Medical History, Past Surgical History, Family History and Social History were reviewed in [employer] record.  Physical Exam: General: Well developed , well nourished, no acute distress Head: Normocephalic and atraumatic Eyes:  sclerae anicteric, EOMI Ears: Normal auditory acuity Mouth: No deformity or lesions Neck: Supple, no masses or thyromegaly Lungs: Clear throughout to auscultation Heart: Regular rate and rhythm; no murmurs, rubs or bruits Abdomen: Soft, non tender and non distended. No masses, hepatosplenomegaly or hernias noted. Normal Bowel sounds Rectal: Deferred to colonoscopy Musculoskeletal: Symmetrical with no gross deformities  Skin: No lesions on visible extremities Pulses:  Normal pulses noted Extremities: No clubbing, cyanosis, edema or deformities noted Neurological: Alert oriented x 4, grossly nonfocal Cervical Nodes:  No significant cervical adenopathy Inguinal Nodes: No significant inguinal adenopathy Psychological:  Alert and cooperative. Normal mood and affect  Assessment and Recommendations:  1. Heme + stool. Lower abdominal discomfort. Schedule colonoscopy. The risks, benefits, and alternatives to colonoscopy with possible biopsy and possible polypectomy were discussed with  the patient and they consent to proceed.

## 2014-01-18 ENCOUNTER — Ambulatory Visit: Payer: BC Managed Care – PPO | Admitting: Radiation Oncology

## 2014-01-19 ENCOUNTER — Encounter: Payer: Self-pay | Admitting: Gastroenterology

## 2014-01-20 ENCOUNTER — Ambulatory Visit (AMBULATORY_SURGERY_CENTER): Payer: BC Managed Care – PPO | Admitting: Gastroenterology

## 2014-01-20 ENCOUNTER — Encounter: Payer: Self-pay | Admitting: Gastroenterology

## 2014-01-20 VITALS — BP 137/89 | HR 68 | Temp 98.0°F | Resp 15 | Ht 72.0 in | Wt 171.0 lb

## 2014-01-20 DIAGNOSIS — D126 Benign neoplasm of colon, unspecified: Secondary | ICD-10-CM

## 2014-01-20 DIAGNOSIS — R195 Other fecal abnormalities: Secondary | ICD-10-CM

## 2014-01-20 MED ORDER — SODIUM CHLORIDE 0.9 % IV SOLN
500.0000 mL | INTRAVENOUS | Status: DC
Start: 2014-01-20 — End: 2014-01-20

## 2014-01-20 MED ORDER — SODIUM CHLORIDE 0.9 % IV SOLN: 500.0000 mL | INTRAVENOUS | Status: DC

## 2014-01-20 NOTE — Progress Notes (Signed)
Called to room to assist during endoscopic procedure.  Patient ID and intended procedure confirmed with present staff. Received instructions for my participation in the procedure from the performing physician.  

## 2014-01-20 NOTE — Patient Instructions (Signed)
YOU HAD AN ENDOSCOPIC PROCEDURE TODAY AT THE [facility]: Refer to the procedure report that was given to you for any specific questions about what was found during the examination.  If the procedure report does not answer your questions, please call your gastroenterologist to clarify.  If you requested that your care partner not be given the details of your procedure findings, then the procedure report has been included in a sealed envelope for you to review at your convenience later.  YOU SHOULD EXPECT: Some feelings of bloating in the abdomen. Passage of more gas than usual.  Walking can help get rid of the air that was put into your GI tract during the procedure and reduce the bloating. If you had a lower endoscopy (such as a colonoscopy or flexible sigmoidoscopy) you may notice spotting of blood in your stool or on the toilet paper. If you underwent a bowel prep for your procedure, then you may not have a normal bowel movement for a few days.  DIET: Your first meal following the procedure should be a light meal and then it is ok to progress to your normal diet.  A half-sandwich or bowl of soup is an example of a good first meal.  Heavy or fried foods are harder to digest and may make you feel nauseous or bloated.  Likewise meals heavy in dairy and vegetables can cause extra gas to form and this can also increase the bloating.  Drink plenty of fluids but you should avoid alcoholic beverages for 24 hours.  ACTIVITY: Your care partner should take you home directly after the procedure.  You should plan to take it easy, moving slowly for the rest of the day.  You can resume normal activity the day after the procedure however you should NOT DRIVE or use heavy machinery for 24 hours (because of the sedation medicines used during the test).    SYMPTOMS TO REPORT IMMEDIATELY: A gastroenterologist can be reached at any hour.  During normal business hours, 8:30 AM to 5:00 PM Monday through Friday,  call [phone].  After hours and on weekends, please call the GI answering service at [phone] who will take a message and have the physician on call contact you.   Following lower endoscopy (colonoscopy or flexible sigmoidoscopy):  Excessive amounts of blood in the stool  Significant tenderness or worsening of abdominal pains  Swelling of the abdomen that is new, acute  Fever of 100F or higher  FOLLOW UP: If any biopsies were taken you will be contacted by phone or by letter within the next 1-3 weeks.  Call your gastroenterologist if you have not heard about the biopsies in 3 weeks.  Our staff will call the home number listed on your records the next business day following your procedure to check on you and address any questions or concerns that you may have at that time regarding the information given to you following your procedure. This is a courtesy call and so if there is no answer at the home number and we have not heard from you through the emergency physician on call, we will assume that you have returned to your regular daily activities without incident.  SIGNATURES/CONFIDENTIALITY: You and/or your care partner have signed paperwork which will be entered into your electronic medical record.  These signatures attest to the fact that that the information above on your After Visit Summary has been reviewed and is understood.  Full responsibility of the confidentiality of this   discharge information lies with you and/or your care-partner.  Await pathology results  Please continue your normal medications  Please read over handouts about polyps, diverticulosis, hemorrhoids, and high fiber diets  Dr. Lynne Leader office nurse will call you to set up CT scan

## 2014-01-20 NOTE — Progress Notes (Signed)
Report to pacu rn, vss, bbs=clear 

## 2014-01-20 NOTE — Op Note (Signed)
[facility]  [address] [place], [address]   COLONOSCOPY PROCEDURE REPORT  PATIENT: Lee Barnes, Lee Barnes  MR#: [id number] BIRTHDATE: 1951/07/18 , 19  yrs. old GENDER: Male ENDOSCOPIST: Ladene Artist, MD, [facility] REFERRED PP:JKDT [employer], M.D. PROCEDURE DATE:  01/20/2014 PROCEDURE:   Colonoscopy with biopsy First Screening Colonoscopy - Avg.  risk and is 50 yrs.  old or older - No.  Prior Negative Screening - Now for repeat screening. N/A  History of Adenoma - Now for follow-up colonoscopy & has been > or = to 3 yrs.  N/A  Polyps Removed Today? Yes. ASA CLASS:   Class II INDICATIONS:heme-positive stool and lower abdominal pain. MEDICATIONS: MAC sedation, administered by CRNA and propofol (Diprivan) 270mg  IV DESCRIPTION OF PROCEDURE:   After the risks benefits and alternatives of the procedure were thoroughly explained, informed consent was obtained.  A digital rectal exam revealed no abnormalities of the rectum.   The LB [id number]  endoscope was introduced through the anus and advanced to the cecum, which was identified by both the appendix and ileocecal valve. No adverse events experienced.   The quality of the prep was excellent, using MoviPrep  The instrument was then slowly withdrawn as the colon was fully examined.  COLON FINDINGS: Mild diverticulosis was noted in the ascending colon and sigmoid colon.   Two sessile polyps measuring 3-4 mm in size were found in the sigmoid colon.  A polypectomy was performed with cold forceps.  The resection was complete and the polyp tissue was completely retrieved.   The colon was otherwise normal.  There was no diverticulosis, inflammation, polyps or cancers unless previously stated.  Retroflexed views revealed moderate internal hemorrhoids. The time to cecum=5 minutes 22 seconds.  Withdrawal time=11 minutes 34 seconds.  The scope was withdrawn and the procedure completed. COMPLICATIONS: There were no  complications.  ENDOSCOPIC IMPRESSION: 1.   Mild diverticulosis in the ascending colon and sigmoid colon 2.   Two sessile polyps measuring 3-4 mm in the sigmoid colon; polypectomy performed with cold forceps 3.   Moderate internal hemorrhoids  RECOMMENDATIONS: 1.  Await pathology results 2.  High fiber diet with liberal fluid intake. 3.  Repeat colonoscopy in 5 years if polyp(s) adenomatous; otherwise 10 years 4.  My office will arrange for you to have a CT scan of abdomen and pelvis.  eSigned:  Ladene Artist, MD, [facility] 01/20/2014 3:08 PM

## 2014-01-20 NOTE — Progress Notes (Signed)
Oral contrast given to pt.

## 2014-01-21 ENCOUNTER — Telehealth: Payer: Self-pay

## 2014-01-21 ENCOUNTER — Other Ambulatory Visit: Payer: Self-pay

## 2014-01-21 DIAGNOSIS — R1031 Right lower quadrant pain: Principal | ICD-10-CM

## 2014-01-21 DIAGNOSIS — G8929 Other chronic pain: Secondary | ICD-10-CM

## 2014-01-21 NOTE — Telephone Encounter (Signed)
No answer, left message

## 2014-01-21 NOTE — Progress Notes (Signed)
Patient is scheduled for CT scan per procedure report for 01/27/14 3:00.  I have left a message for the patient to call back to discuss

## 2014-01-27 ENCOUNTER — Ambulatory Visit (INDEPENDENT_AMBULATORY_CARE_PROVIDER_SITE_OTHER)
Admission: RE | Admit: 2014-01-27 | Discharge: 2014-01-27 | Disposition: A | Discharge status: Home / Self Care | Payer: BC Managed Care – PPO | Source: Ambulatory Visit | Attending: Gastroenterology | Admitting: Gastroenterology

## 2014-01-27 DIAGNOSIS — G8929 Other chronic pain: Secondary | ICD-10-CM

## 2014-01-27 DIAGNOSIS — R1031 Right lower quadrant pain: Secondary | ICD-10-CM

## 2014-01-27 MED ORDER — IOHEXOL 300 MG/ML  SOLN
100.0000 mL | Freq: Once | INTRAMUSCULAR | Status: AC | PRN
  Administered 2014-01-27: 100 mL via INTRAVENOUS

## 2014-01-30 ENCOUNTER — Encounter: Payer: Self-pay | Admitting: Gastroenterology

## 2014-03-23 ENCOUNTER — Other Ambulatory Visit: Payer: Self-pay | Admitting: Hematology and Oncology

## 2014-03-23 DIAGNOSIS — C109 Malignant neoplasm of oropharynx, unspecified: Secondary | ICD-10-CM

## 2014-03-23 DIAGNOSIS — R5383 Other fatigue: Secondary | ICD-10-CM

## 2014-03-24 ENCOUNTER — Ambulatory Visit (HOSPITAL_BASED_OUTPATIENT_CLINIC_OR_DEPARTMENT_OTHER): Payer: BC Managed Care – PPO | Admitting: Hematology and Oncology

## 2014-03-24 ENCOUNTER — Encounter: Payer: Self-pay | Admitting: Hematology and Oncology

## 2014-03-24 ENCOUNTER — Other Ambulatory Visit (HOSPITAL_BASED_OUTPATIENT_CLINIC_OR_DEPARTMENT_OTHER): Payer: BC Managed Care – PPO

## 2014-03-24 VITALS — BP 157/95 | HR 73 | Temp 97.8°F | Resp 18 | Ht 72.0 in | Wt 174.2 lb

## 2014-03-24 DIAGNOSIS — Z923 Personal history of irradiation: Secondary | ICD-10-CM

## 2014-03-24 DIAGNOSIS — K117 Disturbances of salivary secretion: Secondary | ICD-10-CM

## 2014-03-24 DIAGNOSIS — C109 Malignant neoplasm of oropharynx, unspecified: Secondary | ICD-10-CM

## 2014-03-24 DIAGNOSIS — C01 Malignant neoplasm of base of tongue: Secondary | ICD-10-CM

## 2014-03-24 DIAGNOSIS — D72819 Decreased white blood cell count, unspecified: Secondary | ICD-10-CM

## 2014-03-24 DIAGNOSIS — R4189 Other symptoms and signs involving cognitive functions and awareness: Secondary | ICD-10-CM

## 2014-03-24 DIAGNOSIS — T451X5A Adverse effect of antineoplastic and immunosuppressive drugs, initial encounter: Secondary | ICD-10-CM

## 2014-03-24 DIAGNOSIS — R5383 Other fatigue: Secondary | ICD-10-CM

## 2014-03-24 LAB — COMPREHENSIVE METABOLIC PANEL (CC13)
ALT: 15 U/L (ref 0–55)
AST: 18 U/L (ref 5–34)
Albumin: 3.9 g/dL (ref 3.5–5.0)
Alkaline Phosphatase: 106 U/L (ref 40–150)
Anion Gap: 8 mEq/L (ref 3–11)
BUN: 11.5 mg/dL (ref 7.0–26.0)
CO2: 24 mEq/L (ref 22–29)
Calcium: 9.9 mg/dL (ref 8.4–10.4)
Chloride: 106 mEq/L (ref 98–109)
Creatinine: 1.1 mg/dL (ref 0.7–1.3)
Glucose: 100 mg/dl (ref 70–140)
Potassium: 5.1 mEq/L (ref 3.5–5.1)
Sodium: 138 mEq/L (ref 136–145)
Total Bilirubin: 0.33 mg/dL (ref 0.20–1.20)
Total Protein: 7.4 g/dL (ref 6.4–8.3)

## 2014-03-24 LAB — CBC WITH DIFFERENTIAL/PLATELET
BASO%: 0 % (ref 0.0–2.0)
Basophils Absolute: 0 10*3/uL (ref 0.0–0.1)
EOS%: 3.5 % (ref 0.0–7.0)
Eosinophils Absolute: 0.1 10*3/uL (ref 0.0–0.5)
HCT: 39.3 % (ref 38.4–49.9)
HGB: 13.1 g/dL (ref 13.0–17.1)
LYMPH%: 14 % (ref 14.0–49.0)
MCH: 29 pg (ref 27.2–33.4)
MCHC: 33.3 g/dL (ref 32.0–36.0)
MCV: 86.9 fL (ref 79.3–98.0)
MONO#: 0.5 10*3/uL (ref 0.1–0.9)
MONO%: 13.4 % (ref 0.0–14.0)
NEUT#: 2.6 10*3/uL (ref 1.5–6.5)
NEUT%: 69.1 % (ref 39.0–75.0)
Platelets: 188 10*3/uL (ref 140–400)
RBC: 4.52 10*6/uL (ref 4.20–5.82)
RDW: 13.9 % (ref 11.0–14.6)
WBC: 3.7 10*3/uL — ABNORMAL LOW (ref 4.0–10.3)
lymph#: 0.5 10*3/uL — ABNORMAL LOW (ref 0.9–3.3)

## 2014-03-24 LAB — TSH CHCC: TSH: 3.119 m(IU)/L (ref 0.320–4.118)

## 2014-03-24 LAB — T4, FREE: Free T4: 1.09 ng/dL (ref 0.80–1.80)

## 2014-03-24 NOTE — Progress Notes (Signed)
[facility] progress notes  Patient Care Team: Denita Lung, MD as PCP - General Rozetta Nunnery, MD Rexene Edison, MD as Attending Physician (Radiation Oncology) Heath Lark, MD as Consulting Physician (Hematology and Oncology)  CHIEF COMPLAINTS/PURPOSE OF VISIT:  Tongue cancer, no evidence of recurrence  HISTORY OF PRESENTING ILLNESS:  Lee Barnes 63 y.o. male was transferred to my care after his prior physician has left.  I reviewed the patient's records extensive and collaborated the history with the patient. Summary of his history is as follows: This patient was discovered to have tongue cancer after presence of bilateral lymphadenopathy. 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 He was treated with concurrent cisplatin and radiation between November 05, 2010, and November 26, 2010. He achieved complete response. His last imaging study dated 03/23/2013 show no evidence of recurrence  The patient still had persistent dry mouth and hearing deficit related to treatment. He denies any altered taste sensation. He is gaining weight normally. He denies any dysphagia. Denies any new lymphadenopathy.  MEDICAL HISTORY:  Past Medical History  Diagnosis Date  . Diverticulosis 2008  . Allergy to environmental factors   . Depression   . History of radiation therapy 11/05/10-12/26/10    r base tongue, 7000 cGy 35 sessions  . 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 Squamous cell carcinoma     SURGICAL HISTORY: Past Surgical History  Procedure Laterality Date  . Appendectomy    . Inguinal hernia repair Right 1960  . Lumbar disc surgery      SOCIAL HISTORY: History   Social History  . Marital Status: Married    Spouse Name: N/A    Number of Children: 3  . Years of Education: N/A   Occupational History  . 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  Smoking status: Never Smoker   . Smokeless tobacco: Never Used  . Alcohol Use: Yes     Comment: "every now and then"  . Drug Use: No  . Sexual Activity: Yes   Other Topics Concern  . Not on file   Social History Narrative  . No narrative on file    FAMILY HISTORY: Family History  Problem Relation Age of Onset  . Colon cancer Neg Hx   . Heart disease Father   . Cancer Father     throat ca  . Heart disease Mother     ALLERGIES:  is allergic to codeine.  MEDICATIONS:  Current Outpatient Prescriptions  Medication Sig Dispense Refill  . aspirin 81 MG tablet Take 81 mg by mouth as needed for pain.      . Avanafil (STENDRA) 100 MG TABS Take 1 tablet by mouth 1 day or 1 dose.  3 tablet  0  . fluticasone (FLONASE) 50 MCG/ACT nasal spray Place 2 sprays into both nostrils daily as needed.  16 g  11   No current facility-administered medications for this visit.    REVIEW OF SYSTEMS:   Constitutional: Denies fevers, chills or abnormal night sweats Eyes: Denies blurriness of vision, double vision or watery eyes Ears, nose, mouth, throat, and face: Denies mucositis or sore throat Respiratory: Denies cough, dyspnea or wheezes Cardiovascular: Denies palpitation, chest discomfort or lower extremity swelling Gastrointestinal:  Denies nausea, heartburn or change in bowel habits Skin: Denies abnormal skin rashes Lymphatics: Denies new lymphadenopathy or easy bruising Neurological:Denies numbness, tingling or new weaknesses  Behavioral/Psych: Mood is stable, no new changes  All other systems were reviewed with the patient and are negative.  PHYSICAL EXAMINATION: ECOG PERFORMANCE STATUS: 0 - Asymptomatic  Filed Vitals:   03/24/14 0904  BP: 157/95  Pulse: 73  Temp: 97.8 F (36.6 C)  Resp: 18   Filed Weights   03/24/14 0904  Weight: 174 lb 3.2 oz (79.017 kg)    GENERAL:alert, no distress and comfortable SKIN: skin color, texture, turgor are normal, no rashes or significant  lesions EYES: normal, conjunctiva are pink and non-injected, sclera clear OROPHARYNX:no exudate, normal lips, buccal mucosa, and tongue. NECK: Next is thickened and hard from radiation induced fibrosis, thyroid normal size, non-tender, without nodularity LYMPH:  no palpable lymphadenopathy in the cervical, axillary or inguinal LUNGS: clear to auscultation and percussion with normal breathing effort HEART: regular rate & rhythm and no murmurs without lower extremity edema ABDOMEN:abdomen soft, non-tender and normal bowel sounds Musculoskeletal:no cyanosis of digits and no clubbing  PSYCH: alert & oriented x 3 with fluent speech NEURO: no focal motor/sensory deficits  LABORATORY DATA:  I have reviewed the data as listed Lab Results  Component Value Date   WBC 3.7* 03/24/2014   HGB 13.1 03/24/2014   HCT 39.3 03/24/2014   MCV 86.9 03/24/2014   PLT 188 03/24/2014    Recent Labs  12/13/13 0949  NA 134*  K 5.1  CL 99  CO2 26  GLUCOSE 88  BUN 13  CREATININE 1.04  CALCIUM 9.3  PROT 7.3  ALBUMIN 4.3  AST 19  ALT 14  ALKPHOS 97  BILITOT 0.5   ASSESSMENT & PLAN:  #1 tongue cancer He has no evidence of clinical recurrence. I recommend history and physical examination by ENT and radiation oncologist. I will discharge him from the clinic. There is no role for routine imaging studies. I recommend he has thyroid function test checked at least on a yearly basis by his primary care provider. #2 chronic leukopenia This is likely related to prior exposure to chemotherapy and radiation. 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 It does not interfere with his activities of daily living.   All questions were answered. The patient knows to call the clinic with any problems, questions or concerns. I spent 15 minutes counseling the patient face to face. The total time spent in the appointment was 20 minutes and more than 50% was on counseling.     Heath Lark, MD 03/24/2014 9:31 AM

## 2014-07-21 ENCOUNTER — Encounter: Payer: Self-pay | Admitting: *Deleted

## 2014-07-26 ENCOUNTER — Ambulatory Visit
Admission: RE | Admit: 2014-07-26 | Discharge: 2014-07-26 | Disposition: A | Discharge status: Home / Self Care | Payer: BC Managed Care – PPO | Source: Ambulatory Visit | Attending: Radiation Oncology | Admitting: Radiation Oncology

## 2014-07-26 ENCOUNTER — Encounter: Payer: Self-pay | Admitting: Radiation Oncology

## 2014-07-26 VITALS — BP 155/96 | HR 95 | Temp 98.1°F | Resp 20 | Wt 174.1 lb

## 2014-07-26 DIAGNOSIS — C109 Malignant neoplasm of oropharynx, unspecified: Secondary | ICD-10-CM

## 2014-07-26 NOTE — Progress Notes (Signed)
Patient denies pain, fatigue, loss of appetite, difficulty eating, swallowing. He states he has some dry mouth, eats more slowly and drinks fluids to aid with dry mouth. Patient states he "was rushing around today" and attributes that to his elevated BP today. He has taken BP medication in past but states he was taken off due to side effects. Pt sees PCP regularly.

## 2014-07-26 NOTE — Progress Notes (Addendum)
CC: Dr. Radene Journey  Followup note: Lee Barnes returns today approximately 3 years and 7 months following completion of chemoradiation in the management of his stageT1N2b squamous cell carcinoma of the right base of tongue. He is without new complaints today. He still has mild xerostomia. He was released by medical oncology and his TSH back in May was 3.1. His weight is stable. He maintains close dental followup. He saw Lee Barnes in February and he will see him again in February of 2016.  Physical examination: Alert and oriented. Filed Vitals:   07/26/14 1033  BP: 155/96  Pulse: 95  Temp: 98.1 F (36.7 C)  Resp: 20   Nodes: There is no palpable lymphadenopathy in the neck. Oral cavity and oropharynx grossly unremarkable. There is mild xerostomia. No visible or palpable evidence for recurrent disease along his tongue base. Indirect mirror examination confirmatory. Cranial nerves 2-12 intact.  Laboratory data: Lab Results  Component Value Date   TSH 3.119 03/24/2014   Impression: No evidence for recurrent disease.  Plan: Followup with Lee Barnes in February 2016, and follow visit here in one year. He'll need to be monitored for hypothyroidism going forward.

## 2014-10-04 ENCOUNTER — Telehealth: Payer: Self-pay | Admitting: Family Medicine

## 2014-10-04 NOTE — Telephone Encounter (Signed)
i have called pt informed him okay for t-dap he has appointment on Thursday

## 2014-10-04 NOTE — Telephone Encounter (Signed)
I put in a future order so set this up

## 2014-10-04 NOTE — Telephone Encounter (Signed)
Pt called and stated that he has a new premature baby in the family and needs a TDAP vaccine. Out records indicate that he is due. Please advise and make pt aware so he can schedule an appt. Pt can be reached at [phone]

## 2014-10-06 ENCOUNTER — Other Ambulatory Visit (INDEPENDENT_AMBULATORY_CARE_PROVIDER_SITE_OTHER): Payer: BC Managed Care – PPO

## 2014-10-06 DIAGNOSIS — Z23 Encounter for immunization: Secondary | ICD-10-CM

## 2015-02-07 ENCOUNTER — Encounter: Payer: Self-pay | Admitting: Family Medicine

## 2015-02-07 ENCOUNTER — Ambulatory Visit (INDEPENDENT_AMBULATORY_CARE_PROVIDER_SITE_OTHER): Payer: BC Managed Care – PPO | Admitting: Family Medicine

## 2015-02-07 VITALS — BP 140/82 | HR 77 | Wt 178.0 lb

## 2015-02-07 DIAGNOSIS — K4090 Unilateral inguinal hernia, without obstruction or gangrene, not specified as recurrent: Secondary | ICD-10-CM | POA: Diagnosis not present

## 2015-02-07 NOTE — Progress Notes (Signed)
Subjective:    Patient ID: Lee Barnes, male    DOB: 1951-02-07, 64 y.o.   MRN: [id number]  HPI He is here for evaluation of hernia. He apparently was diagnosed in the past with hernia but has not been referred for surgical intervention. He would like to pursue this.   Review of Systems     Objective:   Physical Exam Genital exam shows regular large left inguinal hernia and questionable one on the right.       Assessment & Plan:  Unilateral inguinal hernia without obstruction or gangrene, recurrence not specified - Plan: Ambulatory referral to General Surgery

## 2015-03-01 ENCOUNTER — Other Ambulatory Visit: Payer: Self-pay | Admitting: Surgery

## 2015-03-21 ENCOUNTER — Encounter (HOSPITAL_COMMUNITY)
Admission: RE | Admit: 2015-03-21 | Discharge: 2015-03-21 | Disposition: A | Discharge status: Home / Self Care | Payer: BC Managed Care – PPO | Source: Ambulatory Visit | Attending: Surgery | Admitting: Surgery

## 2015-03-21 ENCOUNTER — Encounter: Payer: Self-pay | Admitting: Family Medicine

## 2015-03-21 ENCOUNTER — Ambulatory Visit (INDEPENDENT_AMBULATORY_CARE_PROVIDER_SITE_OTHER): Payer: BC Managed Care – PPO | Admitting: Family Medicine

## 2015-03-21 ENCOUNTER — Ambulatory Visit (HOSPITAL_COMMUNITY)
Admission: RE | Admit: 2015-03-21 | Discharge: 2015-03-21 | Disposition: A | Discharge status: Home / Self Care | Payer: BC Managed Care – PPO | Source: Ambulatory Visit | Attending: Anesthesiology | Admitting: Anesthesiology

## 2015-03-21 ENCOUNTER — Encounter (HOSPITAL_COMMUNITY): Payer: Self-pay

## 2015-03-21 VITALS — BP 170/110 | HR 77 | Wt 176.4 lb

## 2015-03-21 DIAGNOSIS — I10 Essential (primary) hypertension: Secondary | ICD-10-CM

## 2015-03-21 DIAGNOSIS — Z01810 Encounter for preprocedural cardiovascular examination: Secondary | ICD-10-CM | POA: Insufficient documentation

## 2015-03-21 DIAGNOSIS — Z79899 Other long term (current) drug therapy: Secondary | ICD-10-CM | POA: Diagnosis not present

## 2015-03-21 HISTORY — DX: Personal history of antineoplastic chemotherapy: Z92.21

## 2015-03-21 HISTORY — DX: Tinnitus, unspecified ear: H93.19

## 2015-03-21 HISTORY — DX: Cardiac murmur, unspecified: R01.1

## 2015-03-21 HISTORY — DX: Pneumonia, unspecified organism: J18.9

## 2015-03-21 LAB — BASIC METABOLIC PANEL
Anion gap: 7 (ref 5–15)
BUN: 16 mg/dL (ref 6–20)
CO2: 27 mmol/L (ref 22–32)
Calcium: 9.4 mg/dL (ref 8.9–10.3)
Chloride: 100 mmol/L — ABNORMAL LOW (ref 101–111)
Creatinine, Ser: 1.09 mg/dL (ref 0.61–1.24)
GFR calc Af Amer: >60 mL/min (ref >60)
GFR calc non Af Amer: >60 mL/min (ref >60)
Glucose, Bld: 93 mg/dL (ref 70–99)
Potassium: 4.9 mmol/L (ref 3.5–5.1)
Sodium: 134 mmol/L — ABNORMAL LOW (ref 135–145)

## 2015-03-21 LAB — COMPREHENSIVE METABOLIC PANEL
ALT: 15 U/L (ref 0–53)
AST: 18 U/L (ref 0–37)
Albumin: 4.3 g/dL (ref 3.5–5.2)
Alkaline Phosphatase: 94 U/L (ref 39–117)
BUN: 14 mg/dL (ref 6–23)
CO2: 24 mEq/L (ref 19–32)
Calcium: 9.2 mg/dL (ref 8.4–10.5)
Chloride: 99 mEq/L (ref 96–112)
Creat: 1.04 mg/dL (ref 0.50–1.35)
Glucose, Bld: 86 mg/dL (ref 70–99)
Potassium: 4.7 mEq/L (ref 3.5–5.3)
Sodium: 133 mEq/L — ABNORMAL LOW (ref 135–145)
Total Bilirubin: 0.4 mg/dL (ref 0.2–1.2)
Total Protein: 7.2 g/dL (ref 6.0–8.3)

## 2015-03-21 LAB — LIPID PANEL
Cholesterol: 181 mg/dL (ref 0–200)
HDL: 42 mg/dL (ref >=40)
LDL Cholesterol: 113 mg/dL — ABNORMAL HIGH (ref 0–99)
Total CHOL/HDL Ratio: 4.3 Ratio
Triglycerides: 130 mg/dL (ref <150)
VLDL: 26 mg/dL (ref 0–40)

## 2015-03-21 LAB — CBC
HCT: 41.4 % (ref 39.0–52.0)
Hemoglobin: 13.6 g/dL (ref 13.0–17.0)
MCH: 28.3 pg (ref 26.0–34.0)
MCHC: 32.9 g/dL (ref 30.0–36.0)
MCV: 86.3 fL (ref 78.0–100.0)
Platelets: 195 10*3/uL (ref 150–400)
RBC: 4.8 MIL/uL (ref 4.22–5.81)
RDW: 13.8 % (ref 11.5–15.5)
WBC: 5.1 10*3/uL (ref 4.0–10.5)

## 2015-03-21 MED ORDER — LISINOPRIL-HYDROCHLOROTHIAZIDE 10-12.5 MG PO TABS: 1.0000 | ORAL_TABLET | Freq: Every day | ORAL | Status: DC

## 2015-03-21 NOTE — Progress Notes (Signed)
Subjective:    Patient ID: Lee Barnes, male    DOB: 05/23/1951, 64 y.o.   MRN: [id number]  HPI His blood pressure was recently checked and found to be elevated. He has a previous history of difficulty with hypertension however was stopped when he went through chemotherapy and his blood pressure dropped and he became syncopal. He has not started back on it.  Preparation for the surgery he has had some blood work as well as an EKG.  Review of Systems     Objective:   Physical Exam Alert and in no distress. EKG was reviewed and is normal.       Assessment & Plan:  Essential hypertension - Plan: Comprehensive metabolic panel, Lipid panel, lisinopril-hydrochlorothiazide (PRINZIDE,ZESTORETIC) 10-12.5 MG per tablet  Encounter for long-term (current) use of medications - Plan: Comprehensive metabolic panel, Lipid panel I explained the fact that we now need to start him back on the medication. Discussed possible side effects of the medication with him. If he has any problems, he will let me know otherwise I will see him in one month.

## 2015-03-21 NOTE — Progress Notes (Signed)
Placed call to pt following PCP appt earlier today 03/21/2015. Instructed pt to not take Lisinopril - HCTZ day of surgery. PT verbalized understanding.

## 2015-03-21 NOTE — Patient Instructions (Addendum)
20 Lee Barnes  03/21/2015   Your procedure is scheduled on:    Thursday Mar 30, 2015   Report to [facility] Main Entrance and follow signs to  [place] arrive at 0800 AM.  Call this number if you have problems the morning of surgery [phone] or Presurgical Testing [phone].   Remember:  Do not eat food or drink liquids :After Midnight.     Take these medicines the morning of surgery with A SIP OF WATER: Certizine (Zyrtec)                               You may not have any metal on your body including hair pins and piercings  Do not wear jewelry, colognes, lotions, powders, or deodorant.  Men may shave face and neck.               Do not bring valuables to the hospital. [place].  Contacts, dentures or bridgework may not be worn into surgery.    Patients discharged the day of surgery will not be allowed to drive home.  Name and phone number of your driver:Lee Barnes (wife)   ________________________________________________________________________  [facility] - Preparing for Surgery Before surgery, you can play an important role.  Because skin is not sterile, your skin needs to be as free of germs as possible.  You can reduce the number of germs on your skin by washing with CHG (chlorahexidine gluconate) soap before surgery.  CHG is an antiseptic cleaner which kills germs and bonds with the skin to continue killing germs even after washing. Please DO NOT use if you have an allergy to CHG or antibacterial soaps.  If your skin becomes reddened/irritated stop using the CHG and inform your nurse when you arrive at Short Stay. Do not shave (including legs and underarms) for at least 48 hours prior to the first CHG shower.  You may shave your face/neck. Please follow these instructions carefully:  1.  Shower with CHG Soap the night before surgery and the  morning of Surgery.  2.  If you choose to wash your hair, wash your  hair first as usual with your  normal  shampoo.  3.  After you shampoo, rinse your hair and body thoroughly to remove the  shampoo.                           4.  Use CHG as you would any other liquid soap.  You can apply chg directly  to the skin and wash                       Gently with a scrungie or clean washcloth.  5.  Apply the CHG Soap to your body ONLY FROM THE NECK DOWN.   Do not use on face/ open                           Wound or open sores. Avoid contact with eyes, ears mouth and genitals (private parts).                       Wash face,  Genitals (private parts) with your normal soap.             6.  Wash thoroughly,  paying special attention to the area where your surgery  will be performed.  7.  Thoroughly rinse your body with warm water from the neck down.  8.  DO NOT shower/wash with your normal soap after using and rinsing off  the CHG Soap.                9.  Pat yourself dry with a clean towel.            10.  Wear clean pajamas.            11.  Place clean sheets on your bed the night of your first shower and do not  sleep with pets. Day of Surgery : Do not apply any lotions/deodorants the morning of surgery.  Please wear clean clothes to the hospital/surgery center.  FAILURE TO FOLLOW THESE INSTRUCTIONS MAY RESULT IN THE CANCELLATION OF YOUR SURGERY PATIENT SIGNATURE_________________________________  NURSE SIGNATURE__________________________________  ________________________________________________________________________

## 2015-03-21 NOTE — Progress Notes (Addendum)
Pt in for PAT appt 03/21/2015 BP readings per Left arm sitting 208/109; 198/111; right arm sitting 196/105; 192/108; 182/95; pt denies symptoms of dizziness or headaches did state he felt as if his left ear was stopped up but stated that happens often. This nurse spoke with Dr Marcell Barlow / anesthesia in regards to situation. Pt has not hx of HTN and last reading per MD appt 02/07/2015 140/82 (in epic). Pt instructed to see his PCP Dr Redmond School in regards to BP readings. Pt scheduled appt for today 03/21/2015 - this nurse spoke with Helene Kelp per Dr Lanice Shirts office-appt for 11:45am to 12 noon. Pt stated he did not need to go to ER wanted to see his PCP and stated he felt okay to drive.

## 2015-03-21 NOTE — Progress Notes (Addendum)
LOV note per Dr Redmond School in epic 02/07/2015  LOV note per Dr Alvy Bimler in epic 03/24/2014

## 2015-03-29 NOTE — H&P (Signed)
Lee Barnes 03/01/2015 11:00 AM Location: [facility] Patient #: [id number] DOB: [birth year] Married / Language: [place] / Race: White Male  History of Present Illness (Ilina Xu A. Magnus Ivan MD; 03/01/2015 11:31 AM) Patient words: inguinal hernia.  The patient is a 64 year old male who presents with an inguinal hernia. This is a pleasant gentleman referred to me by Dr. Susann Givens for evaluation of bilateral inguinal hernias. He has had a previous right inguinal hernia repair in the 1960s. He has noticed a large bulge in his left groin for almost a year. It is now getting uncomfortable. He notices a smaller one in the right groin. He has no discomfort today. He has had no objective symptoms. He is otherwise without complaints.   Other Problems Michel Bickers, LPN; 0/45/4098 11:91 AM) Cancer Inguinal Hernia  Past Surgical History Michel Bickers, LPN; 4/78/2956 21:30 AM) Appendectomy Knee Surgery Right. Open Inguinal Hernia Surgery Right. Shoulder Surgery Right. Spinal Surgery - Lower Back Vasectomy  Diagnostic Studies History Michel Bickers, LPN; 8/65/7846 96:29 AM) Colonoscopy within last year  Allergies Michel Bickers, LPN; 04/15/4131 44:01 AM) Codeine and Related Nausea, Vomiting.  Medication History Michel Bickers, LPN; 0/27/2536 64:40 AM) Aspirin EC (81MG  Tablet DR, Oral) Active. ZyrTEC Allergy (10MG  Capsule, Oral) Active. Flonase (50MCG/ACT Suspension, Nasal as needed) Active. Medications Reconciled  Social History Michel Bickers, LPN; 3/47/4259 56:38 AM) Alcohol use Occasional alcohol use. Caffeine use Carbonated beverages, Tea. Illicit drug use Uses socially only. Tobacco use Never smoker.  Family History Michel Bickers, LPN; 7/56/4332 95:18 AM) Heart Disease Father, Mother. Hypertension Mother.  Review of Systems Tresa Endo Dockery LPN; 8/41/6606 30:16 AM) General Not Present- Appetite Loss, Chills, Fatigue, Fever, Night Sweats,  Weight Gain and Weight Loss. Skin Not Present- Change in Wart/Mole, Dryness, Hives, Jaundice, New Lesions, Non-Healing Wounds, Rash and Ulcer. HEENT Not Present- Earache, Hearing Loss, Hoarseness, Nose Bleed, Oral Ulcers, Ringing in the Ears, Seasonal Allergies, Sinus Pain, Sore Throat, Visual Disturbances, Wears glasses/contact lenses and Yellow Eyes. Respiratory Not Present- Bloody sputum, Chronic Cough, Difficulty Breathing, Snoring and Wheezing. Breast Not Present- Breast Mass, Breast Pain, Nipple Discharge and Skin Changes. Cardiovascular Not Present- Chest Pain, Difficulty Breathing Lying Down, Leg Cramps, Palpitations, Rapid Heart Rate, Shortness of Breath and Swelling of Extremities. Gastrointestinal Not Present- Abdominal Pain, Bloating, Bloody Stool, Change in Bowel Habits, Chronic diarrhea, Constipation, Difficulty Swallowing, Excessive gas, Gets full quickly at meals, Hemorrhoids, Indigestion, Nausea, Rectal Pain and Vomiting. Male Genitourinary Not Present- Blood in Urine, Change in Urinary Stream, Frequency, Impotence, Nocturia, Painful Urination, Urgency and Urine Leakage. Musculoskeletal Not Present- Back Pain, Joint Pain, Joint Stiffness, Muscle Pain, Muscle Weakness and Swelling of Extremities. Neurological Not Present- Decreased Memory, Fainting, Headaches, Numbness, Seizures, Tingling, Tremor, Trouble walking and Weakness. Psychiatric Not Present- Anxiety, Bipolar, Change in Sleep Pattern, Depression, Fearful and Frequent crying. Endocrine Not Present- Cold Intolerance, Excessive Hunger, Hair Changes, Heat Intolerance, Hot flashes and New Diabetes. Hematology Not Present- Easy Bruising, Excessive bleeding, Gland problems, HIV and Persistent Infections.   Vitals Tresa Endo Dockery LPN; 0/08/9322 55:73 AM) 03/01/2015 11:00 AM Weight: 174.25 lb Height: 72in Body Surface Area: 2 m Body Mass Index: 23.63 kg/m Temp.: 94F  Pulse: 80 (Regular)  BP: 122/78 (Sitting, Left Arm,  Standard)    Physical Exam (Reilyn Nelson A. Magnus Ivan MD; 03/01/2015 11:31 AM) General Mental Status-Alert. General Appearance-Consistent with stated age. Hydration-Well hydrated. Voice-Normal.  Head and Neck Head-normocephalic, atraumatic with no lesions or palpable masses. Trachea-midline.  Eye Eyeball - Bilateral-Extraocular movements intact. Sclera/Conjunctiva - Bilateral-No  scleral icterus.  Chest and Lung Exam Chest and lung exam reveals -quiet, even and easy respiratory effort with no use of accessory muscles and on auscultation, normal breath sounds, no adventitious sounds and normal vocal resonance. Inspection Chest Wall - Normal. Back - normal.  Cardiovascular Cardiovascular examination reveals -normal heart sounds, regular rate and rhythm with no murmurs and normal pedal pulses bilaterally.  Abdomen Inspection Skin - Scar - no surgical scars. Hernias - Inguinal hernia - Left - Reducible. Note: The left inguinal hernia is large and difficult to reduce but is reducible. Right - Reducible. Palpation/Percussion Palpation and Percussion of the abdomen reveal - Soft, Non Tender, No Rebound tenderness, No Rigidity (guarding) and No hepatosplenomegaly. Auscultation Auscultation of the abdomen reveals - Bowel sounds normal.  Neurologic Neurologic evaluation reveals -alert and oriented x 3 with no impairment of recent or remote memory. Mental Status-Normal.  Musculoskeletal Normal Exam - Left-Upper Extremity Strength Normal and Lower Extremity Strength Normal. Normal Exam - Right-Upper Extremity Strength Normal, Lower Extremity Weakness.    Assessment & Plan (Apollonia Amini A. Magnus Ivan MD; 03/01/2015 11:32 AM) BILATERAL INGUINAL HERNIA (550.92  K40.20) Impression: Because of the difficulty reducing the left inguinal hernia, I recommended bilateral hernia repair with mesh. I discussed both the open and laparoscopic techniques. I would recommend this  laparoscopically. I discussed the risk of surgery which includes but is not limited to bleeding, infection, nerve entrapment, chronic pain, recurrence, etc. I also discussed postoperative recovery. He understands and wishes to proceed.

## 2015-03-30 ENCOUNTER — Observation Stay (HOSPITAL_COMMUNITY)
Admission: RE | Admit: 2015-03-30 | Discharge: 2015-03-31 | Disposition: A | Discharge status: Home / Self Care | Payer: BC Managed Care – PPO | Source: Ambulatory Visit | Attending: Surgery | Admitting: Surgery

## 2015-03-30 ENCOUNTER — Encounter (HOSPITAL_COMMUNITY): Payer: Self-pay | Admitting: *Deleted

## 2015-03-30 ENCOUNTER — Ambulatory Visit (HOSPITAL_COMMUNITY): Payer: BC Managed Care – PPO | Admitting: Anesthesiology

## 2015-03-30 ENCOUNTER — Encounter (HOSPITAL_COMMUNITY)
Admission: RE | Disposition: A | Discharge status: Home / Self Care | Payer: Self-pay | Source: Ambulatory Visit | Attending: Surgery

## 2015-03-30 DIAGNOSIS — I10 Essential (primary) hypertension: Secondary | ICD-10-CM | POA: Diagnosis not present

## 2015-03-30 DIAGNOSIS — K4020 Bilateral inguinal hernia, without obstruction or gangrene, not specified as recurrent: Secondary | ICD-10-CM | POA: Diagnosis not present

## 2015-03-30 DIAGNOSIS — Z7982 Long term (current) use of aspirin: Secondary | ICD-10-CM | POA: Diagnosis not present

## 2015-03-30 DIAGNOSIS — Z886 Allergy status to analgesic agent status: Secondary | ICD-10-CM | POA: Insufficient documentation

## 2015-03-30 HISTORY — PX: INGUINAL HERNIA REPAIR: SUR1180

## 2015-03-30 HISTORY — PX: INGUINAL HERNIA REPAIR: SHX194

## 2015-03-30 LAB — GLUCOSE, CAPILLARY: Glucose-Capillary: 184 mg/dL — ABNORMAL HIGH (ref 65–99)

## 2015-03-30 SURGERY — REPAIR, HERNIA, INGUINAL, BILATERAL, LAPAROSCOPIC
Anesthesia: General | Site: Abdomen | Laterality: Bilateral

## 2015-03-30 MED ORDER — NEOSTIGMINE METHYLSULFATE 10 MG/10ML IV SOLN
INTRAVENOUS | Status: DC | PRN
  Administered 2015-03-30: 4 mg via INTRAVENOUS

## 2015-03-30 MED ORDER — CEFAZOLIN SODIUM-DEXTROSE 2-3 GM-% IV SOLR
INTRAVENOUS | Status: AC
  Filled 2015-03-30: qty 50

## 2015-03-30 MED ORDER — BUPIVACAINE HCL (PF) 0.5 % IJ SOLN
INTRAMUSCULAR | Status: AC
  Filled 2015-03-30: qty 30

## 2015-03-30 MED ORDER — OXYCODONE HCL 5 MG PO TABS
5.0000 mg | ORAL_TABLET | ORAL | Status: DC | PRN
  Administered 2015-03-30: 5 mg via ORAL
  Filled 2015-03-30: qty 1

## 2015-03-30 MED ORDER — BUPIVACAINE HCL 0.5 % IJ SOLN
INTRAMUSCULAR | Status: DC | PRN
  Administered 2015-03-30: 20 mL

## 2015-03-30 MED ORDER — 0.9 % SODIUM CHLORIDE (POUR BTL) OPTIME
TOPICAL | Status: DC | PRN
  Administered 2015-03-30: 1000 mL

## 2015-03-30 MED ORDER — ACETAMINOPHEN 650 MG RE SUPP
650.0000 mg | Freq: Four times a day (QID) | RECTAL | Status: DC | PRN
  Filled 2015-03-30: qty 1

## 2015-03-30 MED ORDER — GLYCOPYRROLATE 0.2 MG/ML IJ SOLN
INTRAMUSCULAR | Status: AC
  Filled 2015-03-30: qty 3

## 2015-03-30 MED ORDER — ACETAMINOPHEN 325 MG PO TABS: 650.0000 mg | ORAL_TABLET | ORAL | Status: DC | PRN

## 2015-03-30 MED ORDER — PROMETHAZINE HCL 25 MG/ML IJ SOLN
6.2500 mg | INTRAMUSCULAR | Status: DC | PRN
  Administered 2015-03-30: 6.25 mg via INTRAVENOUS
  Filled 2015-03-30: qty 1

## 2015-03-30 MED ORDER — FENTANYL CITRATE (PF) 250 MCG/5ML IJ SOLN
INTRAMUSCULAR | Status: DC | PRN
  Administered 2015-03-30 (×2): 50 ug via INTRAVENOUS
  Administered 2015-03-30: 150 ug via INTRAVENOUS

## 2015-03-30 MED ORDER — PROPOFOL 10 MG/ML IV BOLUS
INTRAVENOUS | Status: AC
  Filled 2015-03-30: qty 20

## 2015-03-30 MED ORDER — ONDANSETRON HCL 4 MG/2ML IJ SOLN
INTRAMUSCULAR | Status: AC
  Filled 2015-03-30: qty 2

## 2015-03-30 MED ORDER — ACETAMINOPHEN 650 MG RE SUPP
650.0000 mg | RECTAL | Status: DC | PRN
  Filled 2015-03-30: qty 1

## 2015-03-30 MED ORDER — CEFAZOLIN SODIUM-DEXTROSE 2-3 GM-% IV SOLR
2.0000 g | INTRAVENOUS | Status: AC
  Administered 2015-03-30: 2 g via INTRAVENOUS

## 2015-03-30 MED ORDER — LACTATED RINGERS IV SOLN
INTRAVENOUS | Status: DC | PRN
  Administered 2015-03-30 (×2): via INTRAVENOUS

## 2015-03-30 MED ORDER — OXYCODONE-ACETAMINOPHEN 5-325 MG PO TABS: 1.0000 | ORAL_TABLET | ORAL | Status: DC | PRN

## 2015-03-30 MED ORDER — GLYCOPYRROLATE 0.2 MG/ML IJ SOLN
INTRAMUSCULAR | Status: DC | PRN
  Administered 2015-03-30: 0.6 mg via INTRAVENOUS

## 2015-03-30 MED ORDER — KCL IN DEXTROSE-NACL 20-5-0.45 MEQ/L-%-% IV SOLN
INTRAVENOUS | Status: DC
  Administered 2015-03-30 – 2015-03-31 (×2): via INTRAVENOUS
  Filled 2015-03-30 (×2): qty 1000

## 2015-03-30 MED ORDER — ROCURONIUM BROMIDE 100 MG/10ML IV SOLN
INTRAVENOUS | Status: DC | PRN
  Administered 2015-03-30: 20 mg via INTRAVENOUS

## 2015-03-30 MED ORDER — HYDROMORPHONE HCL 1 MG/ML IJ SOLN: 0.2500 mg | INTRAMUSCULAR | Status: DC | PRN

## 2015-03-30 MED ORDER — SODIUM CHLORIDE 0.9 % IV SOLN: 250.0000 mL | INTRAVENOUS | Status: DC | PRN

## 2015-03-30 MED ORDER — SUCCINYLCHOLINE CHLORIDE 20 MG/ML IJ SOLN
INTRAMUSCULAR | Status: DC | PRN
  Administered 2015-03-30: 100 mg via INTRAVENOUS

## 2015-03-30 MED ORDER — MORPHINE SULFATE 10 MG/ML IJ SOLN
1.0000 mg | INTRAMUSCULAR | Status: DC | PRN
  Administered 2015-03-31: 2 mg via INTRAVENOUS
  Filled 2015-03-30: qty 1

## 2015-03-30 MED ORDER — LIDOCAINE HCL (CARDIAC) 20 MG/ML IV SOLN
INTRAVENOUS | Status: DC | PRN
  Administered 2015-03-30: 50 mg via INTRAVENOUS

## 2015-03-30 MED ORDER — MIDAZOLAM HCL 5 MG/5ML IJ SOLN
INTRAMUSCULAR | Status: DC | PRN
  Administered 2015-03-30: 2 mg via INTRAVENOUS

## 2015-03-30 MED ORDER — KETOROLAC TROMETHAMINE 30 MG/ML IJ SOLN
INTRAMUSCULAR | Status: DC | PRN
  Administered 2015-03-30: 30 mg via INTRAVENOUS

## 2015-03-30 MED ORDER — ONDANSETRON HCL 4 MG/2ML IJ SOLN
INTRAMUSCULAR | Status: DC | PRN
  Administered 2015-03-30: 4 mg via INTRAVENOUS

## 2015-03-30 MED ORDER — SODIUM CHLORIDE 0.9 % IJ SOLN: 3.0000 mL | Freq: Two times a day (BID) | INTRAMUSCULAR | Status: DC

## 2015-03-30 MED ORDER — DEXAMETHASONE SODIUM PHOSPHATE 10 MG/ML IJ SOLN
INTRAMUSCULAR | Status: DC | PRN
  Administered 2015-03-30: 10 mg via INTRAVENOUS

## 2015-03-30 MED ORDER — MIDAZOLAM HCL 2 MG/2ML IJ SOLN
INTRAMUSCULAR | Status: AC
  Filled 2015-03-30: qty 2

## 2015-03-30 MED ORDER — ACETAMINOPHEN 325 MG PO TABS: 650.0000 mg | ORAL_TABLET | Freq: Four times a day (QID) | ORAL | Status: DC | PRN

## 2015-03-30 MED ORDER — ONDANSETRON HCL 4 MG/2ML IJ SOLN
4.0000 mg | Freq: Four times a day (QID) | INTRAMUSCULAR | Status: DC | PRN
  Administered 2015-03-30: 4 mg via INTRAVENOUS
  Filled 2015-03-30: qty 2

## 2015-03-30 MED ORDER — SODIUM CHLORIDE 0.9 % IJ SOLN: 3.0000 mL | INTRAMUSCULAR | Status: DC | PRN

## 2015-03-30 MED ORDER — PROPOFOL 10 MG/ML IV BOLUS
INTRAVENOUS | Status: DC | PRN
  Administered 2015-03-30: 150 mg via INTRAVENOUS

## 2015-03-30 MED ORDER — OXYCODONE HCL 5 MG PO TABS: 5.0000 mg | ORAL_TABLET | ORAL | Status: DC | PRN

## 2015-03-30 MED ORDER — HYDROMORPHONE HCL 1 MG/ML IJ SOLN: 1.0000 mg | INTRAMUSCULAR | Status: DC | PRN

## 2015-03-30 MED ORDER — LABETALOL HCL 5 MG/ML IV SOLN
INTRAVENOUS | Status: DC | PRN
  Administered 2015-03-30: 5 mg via INTRAVENOUS

## 2015-03-30 MED ORDER — FENTANYL CITRATE (PF) 250 MCG/5ML IJ SOLN
INTRAMUSCULAR | Status: AC
  Filled 2015-03-30: qty 5

## 2015-03-30 MED ORDER — LIDOCAINE HCL (CARDIAC) 20 MG/ML IV SOLN
INTRAVENOUS | Status: AC
Start: 2015-03-30 — End: 2015-03-30
  Filled 2015-03-30: qty 5

## 2015-03-30 SURGICAL SUPPLY — 28 items
BANDAGE ADH SHEER 1  50/CT (GAUZE/BANDAGES/DRESSINGS) IMPLANT
BENZOIN TINCTURE PRP APPL 2/3 (GAUZE/BANDAGES/DRESSINGS) ×2 IMPLANT
DECANTER SPIKE VIAL GLASS SM (MISCELLANEOUS) ×2 IMPLANT
DEVICE SECURE STRAP 25 ABSORB (INSTRUMENTS) IMPLANT
DISSECT BALLN SPACEMKR + OVL (BALLOONS) ×2
DISSECTOR BALLN SPACEMKR + OVL (BALLOONS) ×1 IMPLANT
DISSECTOR BLUNT TIP ENDO 5MM (MISCELLANEOUS) IMPLANT
DRAPE LAPAROSCOPIC ABDOMINAL (DRAPES) ×2 IMPLANT
DRAPE UTILITY XL STRL (DRAPES) ×2 IMPLANT
DRSG TEGADERM 4X4.75 (GAUZE/BANDAGES/DRESSINGS) ×2 IMPLANT
ELECT REM PT RETURN 9FT ADLT (ELECTROSURGICAL) ×2
ELECTRODE REM PT RTRN 9FT ADLT (ELECTROSURGICAL) ×1 IMPLANT
GOWN STRL REUS W/TWL XL LVL3 (GOWN DISPOSABLE) ×6 IMPLANT
KIT BASIN OR (CUSTOM PROCEDURE TRAY) ×2 IMPLANT
LIQUID BAND (GAUZE/BANDAGES/DRESSINGS) ×2 IMPLANT
MESH 3DMAX 4X6 LT LRG (Mesh General) ×2 IMPLANT
MESH 3DMAX 4X6 RT LRG (Mesh General) ×2 IMPLANT
SCISSORS LAP 5X35 DISP (ENDOMECHANICALS) IMPLANT
SET IRRIG TUBING LAPAROSCOPIC (IRRIGATION / IRRIGATOR) IMPLANT
SOLUTION ANTI FOG 6CC (MISCELLANEOUS) ×2 IMPLANT
STRIP CLOSURE SKIN 1/2X4 (GAUZE/BANDAGES/DRESSINGS) ×2 IMPLANT
SUT MNCRL AB 4-0 PS2 18 (SUTURE) ×2 IMPLANT
TOWEL OR 17X26 10 PK STRL BLUE (TOWEL DISPOSABLE) ×2 IMPLANT
TOWEL OR NON WOVEN STRL DISP B (DISPOSABLE) ×2 IMPLANT
TRAY FOLEY W/METER SILVER 14FR (SET/KITS/TRAYS/PACK) ×2 IMPLANT
TRAY LAPAROSCOPIC (CUSTOM PROCEDURE TRAY) ×2 IMPLANT
TROCAR CANNULA W/PORT DUAL 5MM (MISCELLANEOUS) ×2 IMPLANT
TUBING INSUFFLATION 10FT LAP (TUBING) ×2 IMPLANT

## 2015-03-30 NOTE — Progress Notes (Addendum)
RN entered patient's room to help him get up to ambulate. Patient immediately sat back down and was unresponsive: eyes rolled back and labored breathing. Pale in color. Feet elevated and head laid back. CBG was checked to make sure patient was not hypoglycemic. Patient awakened after 90 -120 seconds. Became nauseated and vomited about 200 mls of green- brown fluid.   Called Dr Harlow Asa and report above. Patient is to be admitted overnight for observation.  1830  Report called to Tery Sanfilippo RN. Pt transported to 1527.

## 2015-03-30 NOTE — Progress Notes (Signed)
Up to BR with help became weak back to bed. Unable to void

## 2015-03-30 NOTE — Interval H&P Note (Signed)
History and Physical Interval Note:no change in H and P  03/30/2015 9:14 AM  Lee Barnes  has presented today for surgery, with the diagnosis of BILATERAL INGUINAL HERNIAS  The various methods of treatment have been discussed with the patient and family. After consideration of risks, benefits and other options for treatment, the patient has consented to  Procedure(s): [place] (Bilateral) as a surgical intervention .  The patient's history has been reviewed, patient examined, no change in status, stable for surgery.  I have reviewed the patient's chart and labs.  Questions were answered to the patient's satisfaction.     [name] A

## 2015-03-30 NOTE — Progress Notes (Signed)
Ambulated around department with RN and walked to restroom. Unable to urinate. Drinking fluids. IVF running. Sitting up in recliner after walk. Pain 5/10. Pain med given. Wife at bedside. Continue to monitor.

## 2015-03-30 NOTE — Transfer of Care (Signed)
Immediate Anesthesia Transfer of Care Note  Patient: Lee Barnes  Procedure(s) Performed: Procedure(s): LAPAROSCOPIC BILATERAL INGUINAL HERNIA REPAIR WITH MESH (Bilateral)  Patient Location: PACU  Anesthesia Type:General  Level of Consciousness: awake, alert  and oriented  Airway & Oxygen Therapy: Patient Spontanous Breathing and Patient connected to face mask oxygen  Post-op Assessment: Report given to RN and Post -op Vital signs reviewed and stable  Post vital signs: Reviewed and stable  Last Vitals:  Filed Vitals:   03/30/15 0851  BP: 154/93  Pulse:   Resp:     Complications: No apparent anesthesia complications

## 2015-03-30 NOTE — Progress Notes (Signed)
According to day shift nurse, pt had not been able to void since his foley was dc/d in Pacu.  He was bladder scanned several times, with only a small mount of urine being seen on screen both times.We scanned him again before notifying MD and only 82 cc showed.  Notified MD on call and obtained order to I and O catherize, and to leave foley in if there was more 300 cc of urine in bladder.  We catherized him and 200 cc urine was obtained. Per MD order we will encourage fluids and monitor his urine output.

## 2015-03-30 NOTE — Anesthesia Preprocedure Evaluation (Addendum)
Anesthesia Evaluation  Patient identified by MRN, date of birth, ID band Patient awake    Reviewed: Allergy & Precautions, Patient's Chart, lab work & pertinent test results  History of Anesthesia Complications Negative for: history of anesthetic complications  Airway Mallampati: II  TM Distance: >3 FB Neck ROM: Full    Dental  (+) Teeth Intact, Dental Advisory Given   Pulmonary neg pulmonary ROS,    Pulmonary exam normal       Cardiovascular hypertension, Pt. on medications Normal cardiovascular exam    Neuro/Psych negative neurological ROS  negative psych ROS   GI/Hepatic Neg liver ROS,   Endo/Other  negative endocrine ROS  Renal/GU negative Renal ROS     Musculoskeletal   Abdominal   Peds  Hematology negative hematology ROS (+)   Anesthesia Other Findings   Reproductive/Obstetrics                            Anesthesia Physical Anesthesia Plan  ASA: II  Anesthesia Plan: General   Post-op Pain Management:    Induction: Intravenous  Airway Management Planned: Oral ETT  Additional Equipment:   Intra-op Plan:   Post-operative Plan: Extubation in OR  Informed Consent: I have reviewed the patients History and Physical, chart, labs and discussed the procedure including the risks, benefits and alternatives for the proposed anesthesia with the patient or authorized representative who has indicated his/her understanding and acceptance.   Dental advisory given  Plan Discussed with: CRNA, Anesthesiologist and Surgeon  Anesthesia Plan Comments:        Anesthesia Quick Evaluation

## 2015-03-30 NOTE — Discharge Instructions (Signed)
[facility] ______[facility] LAPAROSCOPIC SURGERY: POST OP INSTRUCTIONS Always review your discharge instruction sheet given to you by the facility where your surgery was performed. IF YOU HAVE DISABILITY OR FAMILY LEAVE FORMS, YOU MUST BRING THEM TO THE OFFICE FOR PROCESSING.   DO NOT GIVE THEM TO YOUR DOCTOR.  1. A prescription for pain medication may be given to you upon discharge.  Take your pain medication as prescribed, if needed.  If narcotic pain medicine is not needed, then you may take acetaminophen (Tylenol) or ibuprofen (Advil) as needed. 2. Take your usually prescribed medications unless otherwise directed. 3. If you need a refill on your pain medication, please contact your pharmacy.  They will contact our office to request authorization. Prescriptions will not be filled after 5pm or on week-ends. 4. You should follow a light diet the first few days after arrival home, such as soup and crackers, etc.  Be sure to include lots of fluids daily. 5. Most patients will experience some swelling and bruising in the area of the incisions.  Ice packs will help.  Swelling and bruising can take several days to resolve.  6. It is common to experience some constipation if taking pain medication after surgery.  Increasing fluid intake and taking a stool softener (such as Colace) will usually help or prevent this problem from occurring.  A mild laxative (Milk of Magnesia or Miralax) should be taken according to package instructions if there are no bowel movements after 48 hours. 7. Unless discharge instructions indicate otherwise, you may remove your bandages 24-48 hours after surgery, and you may shower at that time.  You may have steri-strips (small skin tapes) in place directly over the incision.  These strips should be left on the skin for 7-10 days.  If your surgeon used skin glue on the incision, you may shower in 24 hours.  The glue will flake off over the next 2-3 weeks.  Any sutures or  staples will be removed at the office during your follow-up visit. 8. ACTIVITIES:  You may resume regular (light) daily activities beginning the next day--such as daily self-care, walking, climbing stairs--gradually increasing activities as tolerated.  You may have sexual intercourse when it is comfortable.  Refrain from any heavy lifting or straining until approved by your doctor. a. You may drive when you are no longer taking prescription pain medication, you can comfortably wear a seatbelt, and you can safely maneuver your car and apply brakes. b. RETURN TO WORK:  __________________________________________________________ 9. You should see your doctor in the office for a follow-up appointment approximately 2-3 weeks after your surgery.  Make sure that you call for this appointment within a day or two after you arrive home to insure a convenient appointment time. 10. OTHER INSTRUCTIONS:ICE PACK AND IBUPROFEN ALSO FOR PAIN 11. NO LIFTING MORE THAN 15 POUNDS FOR 3 WEEKS __________________________________________________________________________________________________________________________ __________________________________________________________________________________________________________________________ WHEN TO CALL YOUR DOCTOR: 1. Fever over 101.0 2. Inability to urinate 3. Continued bleeding from incision. 4. Increased pain, redness, or drainage from the incision. 5. Increasing abdominal pain  The clinic staff is available to answer your questions during regular business hours.  Please dont hesitate to call and ask to speak to one of the nurses for clinical concerns.  If you have a medical emergency, go to the nearest emergency room or call 911.  A surgeon from [facility] is always on call at the hospital. [address], [place], [place], [place]  [address] ? [address] [place], [place]   [address] [phone] ? [phone] ? FAX [phone] Web site:  [email or web address]        General Anesthesia, Care After Refer to this sheet in the next few weeks. These instructions provide you with information on caring for yourself after your procedure. Your health care provider may also give you more specific instructions. Your treatment has been planned according to current medical practices, but problems sometimes occur. Call your health care provider if you have any problems or questions after your procedure. WHAT TO EXPECT AFTER THE PROCEDURE After the procedure, it is typical to experience:  Sleepiness.  Nausea and vomiting. HOME CARE INSTRUCTIONS  For the first 24 hours after general anesthesia:  Have a responsible person with you.  Do not drive a car. If you are alone, do not take public transportation.  Do not drink alcohol.  Do not take medicine that has not been prescribed by your health care provider.  Do not sign important papers or make important decisions.  You may resume a normal diet and activities as directed by your health care provider.  Change bandages (dressings) as directed.  If you have questions or problems that seem related to general anesthesia, call the hospital and ask for the anesthetist or anesthesiologist on call. SEEK MEDICAL CARE IF:  You have nausea and vomiting that continue the day after anesthesia.  You develop a rash. SEEK IMMEDIATE MEDICAL CARE IF:   You have difficulty breathing.  You have chest pain.  You have any allergic problems. Document Released: 02/10/2001 Document Revised: 11/09/2013 Document Reviewed: 05/20/2013 [facility] Patient Information 2015 [place], [place]. This information is not intended to replace advice given to you by your health care provider. Make sure you discuss any questions you have with your health care provider.

## 2015-03-30 NOTE — Op Note (Signed)
LAPAROSCOPIC BILATERAL INGUINAL HERNIA REPAIR WITH MESH  Procedure Note  Lee Barnes 03/30/2015   Pre-op Diagnosis: BILATERAL INGUINAL HERNIAS     Post-op Diagnosis: same  Procedure(s): LAPAROSCOPIC BILATERAL INGUINAL HERNIA REPAIR WITH MESH  Surgeon(s): Coralie Keens, MD  Anesthesia: General  Staff:  Circulator: Maren Reamer, RN Scrub Person: Carron Curie, RN; Rockwell Germany, CST  Estimated Blood Loss: Minimal                        Lee Barnes   Date: 03/30/2015  Time: 10:54 AM

## 2015-03-30 NOTE — Progress Notes (Signed)
I was called to room because pt stated he "was not feeling well, was having difficulty breathing, and feeling nauseous".  Lowered pt's head of bed,  turned his head to the side, administered oxygen, and took his vital signs. There were all WNL. Pt was responsive, talking to [place] the whole time, and said he just did not feel well. He then vomited about 100 cc green emesis. I administered zofran for nausea. After a few minutes, pt verbalized that he felt better.  Will continue to monitor VS and urinary output amount.

## 2015-03-30 NOTE — Anesthesia Procedure Notes (Signed)
Procedure Name: Intubation Performed by: Chandra Batch A Pre-anesthesia Checklist: Patient identified, Timeout performed, Emergency Drugs available, Suction available and Patient being monitored Patient Re-evaluated:Patient Re-evaluated prior to inductionOxygen Delivery Method: Circle system utilized Preoxygenation: Pre-oxygenation with 100% oxygen Intubation Type: IV induction Ventilation: Mask ventilation without difficulty Laryngoscope Size: Mac and 4 Grade View: Grade I Tube type: Oral Tube size: 7.5 mm Number of attempts: 1 Airway Equipment and Method: Stylet Placement Confirmation: ETT inserted through vocal cords under direct vision,  positive ETCO2 and breath sounds checked- equal and bilateral Secured at: 22 cm Tube secured with: Tape Dental Injury: Teeth and Oropharynx as per pre-operative assessment

## 2015-03-30 NOTE — Progress Notes (Addendum)
Patient up to bathroom with assistance s/p bilateral inguinal hernia repair. Unable to void. Became nauseated on way back to room.  Back to bed. Antiemetic to be given.   1440  Nausea has improved. Patient up to walk around entire unit. Unable to void. Foley catheter discontinued around 11 AM this morning. Bladder scanned repeated x 6 with zero mls of urine in bladder.  1715  Bladder scanned for 29 mls. Will continue to monitor

## 2015-03-30 NOTE — Anesthesia Postprocedure Evaluation (Signed)
Anesthesia Post Note  Patient: Lee Barnes  Procedure(s) Performed: Procedure(s) (LRB): LAPAROSCOPIC BILATERAL INGUINAL HERNIA REPAIR WITH MESH (Bilateral)  Anesthesia type: general  Patient location: PACU  Post pain: Pain level controlled  Post assessment: Patient's Cardiovascular Status Stable  Last Vitals:  Filed Vitals:   03/30/15 1141  BP: 156/98  Pulse: 68  Temp: 36.4 C  Resp: 12    Post vital signs: Reviewed and stable  Level of consciousness: sedated  Complications: No apparent anesthesia complications

## 2015-03-31 ENCOUNTER — Encounter (HOSPITAL_COMMUNITY): Payer: Self-pay | Admitting: Surgery

## 2015-03-31 DIAGNOSIS — K4020 Bilateral inguinal hernia, without obstruction or gangrene, not specified as recurrent: Secondary | ICD-10-CM | POA: Diagnosis not present

## 2015-03-31 LAB — CBC WITH DIFFERENTIAL/PLATELET
Basophils Absolute: 0 10*3/uL (ref 0.0–0.1)
Basophils Relative: 0 % (ref 0–1)
Eosinophils Absolute: 0 10*3/uL (ref 0.0–0.7)
Eosinophils Relative: 0 % (ref 0–5)
HCT: 30.4 % — ABNORMAL LOW (ref 39.0–52.0)
Hemoglobin: 10.6 g/dL — ABNORMAL LOW (ref 13.0–17.0)
Lymphocytes Relative: 4 % — ABNORMAL LOW (ref 12–46)
Lymphs Abs: 0.4 10*3/uL — ABNORMAL LOW (ref 0.7–4.0)
MCH: 29.1 pg (ref 26.0–34.0)
MCHC: 34.9 g/dL (ref 30.0–36.0)
MCV: 83.5 fL (ref 78.0–100.0)
Monocytes Absolute: 1.2 10*3/uL — ABNORMAL HIGH (ref 0.1–1.0)
Monocytes Relative: 13 % — ABNORMAL HIGH (ref 3–12)
Neutro Abs: 7.9 10*3/uL — ABNORMAL HIGH (ref 1.7–7.7)
Neutrophils Relative %: 83 % — ABNORMAL HIGH (ref 43–77)
Platelets: 201 10*3/uL (ref 150–400)
RBC: 3.64 MIL/uL — ABNORMAL LOW (ref 4.22–5.81)
RDW: 13.3 % (ref 11.5–15.5)
WBC: 9.5 10*3/uL (ref 4.0–10.5)

## 2015-03-31 MED ORDER — SODIUM CHLORIDE 0.9 % IV BOLUS (SEPSIS)
1000.0000 mL | Freq: Once | INTRAVENOUS | Status: AC
Start: 2015-03-31 — End: 2015-03-31
  Administered 2015-03-31: 1000 mL via INTRAVENOUS

## 2015-03-31 NOTE — Progress Notes (Signed)
1 Day Post-Op  Subjective: Kept overnight after he became vagal after surgery. Feeling better today  Objective: Vital signs in last 24 hours: Temp:  [96 F (35.6 C)-98.2 F (36.8 C)] 97.8 F (36.6 C) (05/13 0958) Pulse Rate:  [60-95] 76 (05/13 0958) Resp:  [12-18] 18 (05/13 0958) BP: (119-182)/(62-93) 141/80 mmHg (05/13 0958) SpO2:  [98 %-100 %] 100 % (05/13 0958) Last BM Date:  (PTA)  Intake/Output from previous day: 05/12 0701 - 05/13 0700 In: 3060 [P.O.:960; I.V.:2100] Out: 700 [Urine:450; Emesis/NG output:200; Blood:50] Intake/Output this shift: Total I/O In: -  Out: 101 [Urine:101]  Abdomen soft with significant ecchymosis  Lab Results:   Recent Labs  03/31/15 0912  WBC 9.5  HGB 10.6*  HCT 30.4*  PLT 201   BMET No results for input(s): NA, K, CL, CO2, GLUCOSE, BUN, CREATININE, CALCIUM in the last 72 hours. PT/INR No results for input(s): LABPROT, INR in the last 72 hours. ABG No results for input(s): PHART, HCO3 in the last 72 hours.  Invalid input(s): PCO2, PO2  Studies/Results: No results found.  Anti-infectives: Anti-infectives    Start     Dose/Rate Route Frequency Ordered Stop   03/30/15 0821  ceFAZolin (ANCEF) IVPB 2 g/50 mL premix     2 g 100 mL/hr over 30 Minutes Intravenous On call to O.R. 03/30/15 0821 03/30/15 1012      Assessment/Plan: s/p Procedure(s): LAPAROSCOPIC BILATERAL INGUINAL HERNIA REPAIR WITH MESH (Bilateral)  Stable post op.  Suspect he had some mild bleeding in the preperitoneal space. He feels better and wants to go home.  Currently he is hemodynamically stable so will discharge him home.  He will call for any problems.     Elexius Minar A 03/31/2015

## 2015-03-31 NOTE — Discharge Summary (Signed)
Physician Discharge Summary  Patient ID: Lee Barnes MRN: [id number] DOB/AGE: 1951/10/31 64 y.o.  Admit date: 03/30/2015 Discharge date: 03/31/2015  Admission Diagnoses:  Discharge Diagnoses:  Principal Problem:   Bilateral inguinal hernia   Discharged Condition: good  Hospital Course: placed in observation postop after becoming vagal.  Felt better POD#1 and was discharged home  Consults: None  Significant Diagnostic Studies:   Treatments: surgery: bilateral laparoscopic inguinal hernia repair with mesh  Discharge Exam: Blood pressure 141/80, pulse 76, temperature 97.8 F (36.6 C), temperature source Oral, resp. rate 18, height 6' (1.829 m), weight 80.003 kg (176 lb 6 oz), SpO2 100 %. General appearance: alert, cooperative and no distress Resp: clear to auscultation bilaterally Cardio: regular rate and rhythm, S1, S2 normal, no murmur, click, rub or gallop Incision/Wound: abdomen soft with ecchymosis and small hematoma  Disposition: 01-Home or Self Care     Medication List    TAKE these medications        aspirin 81 MG tablet  Take 81 mg by mouth daily.     cetirizine 10 MG tablet  Commonly known as:  ZYRTEC  Take 10 mg by mouth daily.     fluticasone 50 MCG/ACT nasal spray  Commonly known as:  FLONASE  Place 2 sprays into both nostrils daily as needed.     lisinopril-hydrochlorothiazide 10-12.5 MG per tablet  Commonly known as:  PRINZIDE,ZESTORETIC  Take 1 tablet by mouth daily.     oxyCODONE-acetaminophen 5-325 MG per tablet  Commonly known as:  ROXICET  Take 1-2 tablets by mouth every 4 (four) hours as needed for severe pain.           Follow-up Information    Follow up with [facility], MD. Schedule an appointment as soon as possible for a visit in 3 weeks.   Specialty:  General Surgery   Contact information:   [address] [place]  [address] [phone]       Signed: Harl Bowie 03/31/2015, 1:25 PM

## 2015-03-31 NOTE — Progress Notes (Signed)
According to NT pt walked to toilet and voided without [place] being able to measure it. NT also reported that pt felt faint when he got up. Will continue to monitor.

## 2015-03-31 NOTE — Op Note (Signed)
NAME:  [name], [name] NO.:  [id number]  MEDICAL RECORD NO.:  [id number]  LOCATION:  1527                         FACILITY:  [facility]  PHYSICIAN:  Abigail Miyamoto, M.D. DATE OF BIRTH:  [birth year]  DATE OF PROCEDURE:  03/30/2015 DATE OF DISCHARGE:                              OPERATIVE REPORT   PREOPERATIVE DIAGNOSIS:  Bilateral inguinal hernias.  POSTOPERATIVE DIAGNOSIS:  Bilateral inguinal hernias.  PROCEDURE:  Bilateral laparoscopic inguinal hernia repair with mesh.  SURGEON:  Abigail Miyamoto, MD  ANESTHESIA:  General with 0.5% Marcaine.  ESTIMATED BLOOD LOSS:  Minimal.  FINDINGS:  The patient was found to have bilateral indirect inguinal hernias with the left being larger than the right.  They were repaired with 2 separate pieces of large Prolene 3D Max mesh from [employer].  PROCEDURE IN DETAIL:  The patient was brought to the operating room, identified as Lee Barnes.  He was placed supine on the operating table.  General anesthesia was induced.  A Foley catheter was then inserted.  His abdomen was then prepped and draped in usual sterile fashion.  I made a small vertical incision below the umbilicus.  I took this down to the fascia which was opened just to the right of the midline.  The rectus muscle was identified and elevated.  The dissecting balloon was then passed underneath the rectus muscle and manipulated towards the pubis.  The dissecting balloon was then insufflated under direct vision dissecting out the preperitoneal space.  I then removed the dissecting balloon and began insufflation with carbon dioxide.  I then placed two 5 mm ports in the patient's lower midline both under direct vision.  I dissected out the right inguinal area first.  The patient had a very small indirect hernia sac which was easily reduced from the cord structures.  I saw no evidence of direct hernia.  Cooper ligament easily identified.  On the left side, the patient had  a much larger indirect hernia sac.  I had to do a moderate amount of dissection to finally reduce the sac completely from the cord structures on left side.  There was no evidence of direct hernia.  Cooper's ligament was also easily identified.  Once I got the cord completely free from the hernia and the hernia sac completely reduced, I brought a piece of [employer] 3D Max left-sided Prolene mesh onto the field.  I placed it through the umbilical trocar and then opening was only on the left inguinal floor. I then tacked it to Cooper's ligament up the medial abdominal wall slightly laterally.  Next, I brought a right-sided piece of [employer] 3D Max Prolene mesh on the fields.  This was a right-sided piece.  I placed it to the trocar at the umbilicus, and again opened as an onlay on the right inguinal floor.  I then tacked it to Cooper's ligament up the medial abdominal wall slightly laterally with the absorbable tacker. Good fixation of the mesh appeared to be achieved.  A wide coverage of the testicular cords and defects appeared to be achieved as well.  At this point, hemostasis also appeared to be achieved.  Both ports were then removed under direct vision.  The preperitoneal space was released of carbon dioxide and appeared to collapse appropriately with the mesh in place.  At this point, after removing all trocars, I closed the fascia of the umbilicus with a figure-of-eight 0 Vicryl suture.  All incisions were then anesthetized with Marcaine.  I performed bilaterally ilioinguinal nerve blocks with Marcaine.  All incisions were then closed with 4-0 Monocryl sutures and skin glue.  The patient tolerated the procedure well.  All the counts were correct at the end of procedure.  The patient was then extubated in the operating room and taken in stable condition to the recovery room.     Abigail Miyamoto, M.D.     DB/MEDQ  D:  03/30/2015  T:  03/31/2015  Job:  [id number]

## 2015-04-01 ENCOUNTER — Observation Stay (HOSPITAL_COMMUNITY): Payer: BC Managed Care – PPO

## 2015-04-01 ENCOUNTER — Inpatient Hospital Stay (HOSPITAL_COMMUNITY)
Admission: EM | Admit: 2015-04-01 | Discharge: 2015-04-05 | DRG: 645 | Disposition: A | Discharge status: Home / Self Care | Payer: BC Managed Care – PPO | Attending: Internal Medicine | Admitting: Internal Medicine

## 2015-04-01 ENCOUNTER — Emergency Department (HOSPITAL_COMMUNITY): Payer: BC Managed Care – PPO

## 2015-04-01 ENCOUNTER — Other Ambulatory Visit (HOSPITAL_COMMUNITY): Payer: Self-pay

## 2015-04-01 ENCOUNTER — Encounter (HOSPITAL_COMMUNITY): Payer: Self-pay | Admitting: Emergency Medicine

## 2015-04-01 ENCOUNTER — Telehealth: Payer: Self-pay | Admitting: Surgery

## 2015-04-01 DIAGNOSIS — Z7982 Long term (current) use of aspirin: Secondary | ICD-10-CM | POA: Diagnosis not present

## 2015-04-01 DIAGNOSIS — Z923 Personal history of irradiation: Secondary | ICD-10-CM

## 2015-04-01 DIAGNOSIS — E222 Syndrome of inappropriate secretion of antidiuretic hormone: Principal | ICD-10-CM | POA: Diagnosis present

## 2015-04-01 DIAGNOSIS — E860 Dehydration: Secondary | ICD-10-CM | POA: Diagnosis present

## 2015-04-01 DIAGNOSIS — Z9221 Personal history of antineoplastic chemotherapy: Secondary | ICD-10-CM | POA: Diagnosis not present

## 2015-04-01 DIAGNOSIS — Z8701 Personal history of pneumonia (recurrent): Secondary | ICD-10-CM

## 2015-04-01 DIAGNOSIS — Z85819 Personal history of malignant neoplasm of unspecified site of lip, oral cavity, and pharynx: Secondary | ICD-10-CM | POA: Diagnosis present

## 2015-04-01 DIAGNOSIS — H538 Other visual disturbances: Secondary | ICD-10-CM | POA: Diagnosis present

## 2015-04-01 DIAGNOSIS — I10 Essential (primary) hypertension: Secondary | ICD-10-CM | POA: Diagnosis present

## 2015-04-01 DIAGNOSIS — Z9049 Acquired absence of other specified parts of digestive tract: Secondary | ICD-10-CM | POA: Diagnosis present

## 2015-04-01 DIAGNOSIS — C109 Malignant neoplasm of oropharynx, unspecified: Secondary | ICD-10-CM | POA: Diagnosis not present

## 2015-04-01 DIAGNOSIS — J309 Allergic rhinitis, unspecified: Secondary | ICD-10-CM | POA: Diagnosis present

## 2015-04-01 DIAGNOSIS — Z885 Allergy status to narcotic agent status: Secondary | ICD-10-CM | POA: Diagnosis not present

## 2015-04-01 DIAGNOSIS — Z85818 Personal history of malignant neoplasm of other sites of lip, oral cavity, and pharynx: Secondary | ICD-10-CM

## 2015-04-01 DIAGNOSIS — E871 Hypo-osmolality and hyponatremia: Secondary | ICD-10-CM | POA: Diagnosis present

## 2015-04-01 LAB — BASIC METABOLIC PANEL
Anion gap: 11 (ref 5–15)
BUN: 14 mg/dL (ref 6–20)
CO2: 22 mmol/L (ref 22–32)
Calcium: 8.6 mg/dL — ABNORMAL LOW (ref 8.9–10.3)
Chloride: 88 mmol/L — ABNORMAL LOW (ref 101–111)
Creatinine, Ser: 0.89 mg/dL (ref 0.61–1.24)
GFR calc Af Amer: >60 mL/min (ref >60)
GFR calc non Af Amer: >60 mL/min (ref >60)
Glucose, Bld: 111 mg/dL — ABNORMAL HIGH (ref 65–99)
Potassium: 4.4 mmol/L (ref 3.5–5.1)
Sodium: 121 mmol/L — ABNORMAL LOW (ref 135–145)

## 2015-04-01 LAB — CBC WITH DIFFERENTIAL/PLATELET
Basophils Absolute: 0 10*3/uL (ref 0.0–0.1)
Basophils Relative: 0 % (ref 0–1)
Eosinophils Absolute: 0.1 10*3/uL (ref 0.0–0.7)
Eosinophils Relative: 2 % (ref 0–5)
HCT: 28.9 % — ABNORMAL LOW (ref 39.0–52.0)
Hemoglobin: 10.1 g/dL — ABNORMAL LOW (ref 13.0–17.0)
Lymphocytes Relative: 8 % — ABNORMAL LOW (ref 12–46)
Lymphs Abs: 0.6 10*3/uL — ABNORMAL LOW (ref 0.7–4.0)
MCH: 29 pg (ref 26.0–34.0)
MCHC: 34.9 g/dL (ref 30.0–36.0)
MCV: 83 fL (ref 78.0–100.0)
Monocytes Absolute: 1.2 10*3/uL — ABNORMAL HIGH (ref 0.1–1.0)
Monocytes Relative: 15 % — ABNORMAL HIGH (ref 3–12)
Neutro Abs: 6.1 10*3/uL (ref 1.7–7.7)
Neutrophils Relative %: 75 % (ref 43–77)
Platelets: 180 10*3/uL (ref 150–400)
RBC: 3.48 MIL/uL — ABNORMAL LOW (ref 4.22–5.81)
RDW: 13.2 % (ref 11.5–15.5)
WBC: 8.1 10*3/uL (ref 4.0–10.5)

## 2015-04-01 LAB — URINALYSIS, ROUTINE W REFLEX MICROSCOPIC
Bilirubin Urine: NEGATIVE
Glucose, UA: NEGATIVE mg/dL
Hgb urine dipstick: NEGATIVE
Ketones, ur: NEGATIVE mg/dL
Leukocytes, UA: NEGATIVE
Nitrite: NEGATIVE
Protein, ur: NEGATIVE mg/dL
Specific Gravity, Urine: 1.017 (ref 1.005–1.030)
Urobilinogen, UA: 0.2 mg/dL (ref 0.0–1.0)
pH: 6.5 (ref 5.0–8.0)

## 2015-04-01 MED ORDER — SODIUM CHLORIDE 0.9 % IV BOLUS (SEPSIS)
1000.0000 mL | Freq: Once | INTRAVENOUS | Status: AC
Start: 2015-04-01 — End: 2015-04-01
  Administered 2015-04-01: 1000 mL via INTRAVENOUS

## 2015-04-01 MED ORDER — SODIUM CHLORIDE 0.9 % IV BOLUS (SEPSIS): 1000.0000 mL | Freq: Once | INTRAVENOUS | Status: DC

## 2015-04-01 NOTE — Telephone Encounter (Signed)
KORNELL HOR  04/01/1951 [id number]  Patient Care Team: Ronnald Nian, MD as PCP - General Drema Halon, MD Chipper Herb, MD as Attending Physician (Radiation Oncology) Artis Delay, MD as Consulting Physician (Hematology and Oncology)  This patient is a 64 y.o.male who calls today for surgical evaluation.   Date of procedure/visit: 03/31/2015  Surgery:   POSTOPERATIVE DIAGNOSIS: Bilateral inguinal hernias.  PROCEDURE: Bilateral laparoscopic inguinal hernia repair with mesh.  SURGEON: Abigail Miyamoto, MD  Reason for call: Blurred vision  I was called by the patient's wife.  Patient status post bilateral inguinal hernia repairs 2 days ago.  Apparently felt a little lightheaded and was kept overnight.  Vagal?  Systolic blood pressure mildly elevated 140/80.  Not tachycardic or bradycardic. Mild anemia.  Went home.  The patient noted his vision felt a little blurred around noon today.  Wife was concerned and called.  She says it is a day.  No severe blindness.  No weakness on one side.  Not falling down.  Not passing out.  Not having difficulty speaking or swallowing.  His appetite is down but he has been tolerating some food.  Drinking liquids okay.  Urinating.  He is passing gas but has not moved his bowels.  On a stool softener.  They have not tried a laxative.  He is been walking around.  He is not markedly worse.  No fevers or chills.  Mild bruising but does not see massively worse.  He has been avoiding narcotics.  Denies pain.  Been using some occasional ibuprofen.  The wife notes he does not have a history of heart problems or strokes.  He is not on blood thinners.  No neurological problems.  No depression or anxiety.  It is not markedly worsening.  Does not have a history of vision problems.  No history of strokes.  Apparently was started on blood pressure medicine.  She did not know exactly what.  I noted he should hold that for now.  Because there are no other  major warning signs such as focal weakness or difficulty speaking, I doubt he has a stroke.  I would monitor closely.  Make sure he is drinking plenty of liquids we does not get dehydrated.  Get on a fiber supplement and take a laxative to get bowels moving so he gets things going.  Try to advance diet as tolerated.  Stop taking nonsteroidals and use Tylenol instead.  If he improves, keep follow-up appointment.  If he does not improve or worsen, may need to be seen sooner.  Perhaps discussed with primary care physician to see if blood pressure medications need to be adjusted.  If markedly worsens, go to emergency room.  I discussed this with the patient's wife numerous different ways, asking numerous questions.  She seemed somewhat reassured but mildly anxious. I noted the problem should not be ignored but at least monitored and followed.  See how he feels.  Avoid things that would make it whose like his ibuprofen.  Consider holding blood pressure medicines for now until reevaluation.  Patient Active Problem List   Diagnosis Date Noted  . Bilateral inguinal hernia 03/30/2015  . Essential hypertension 03/21/2015  . Allergic rhinitis 12/14/2013  . ED (erectile dysfunction) 12/14/2013  . Diverticulosis   . 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 Diverticulosis 2008  . Allergy to environmental factors   . History of radiation therapy 11/05/10-12/26/10    r base  tongue, 7000 cGy 35 sessions  . 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 Heart murmur     hx of in childhood   . Pneumonia     hx of 2012  . History of chemotherapy   . Tinnitus     Past Surgical History  Procedure Laterality Date  . Appendectomy    . Inguinal hernia repair Right 1960  . Lumbar disc surgery    . Tonsillectomy    . Colonscopy     . Inguinal hernia repair Bilateral 03/30/2015    Procedure: LAPAROSCOPIC BILATERAL INGUINAL HERNIA REPAIR WITH  MESH;  Surgeon: Abigail Miyamoto, MD;  Location: [facility];  Service: General;  Laterality: Bilateral;    History   Social History  . Marital Status: Married    Spouse Name: N/A  . Number of Children: 3  . Years of Education: N/A   Occupational History  . OWNER [employer]   Social History Main Topics  . Smoking status: Never Smoker   . Smokeless tobacco: Never Used  . Alcohol Use: Yes     Comment: occas drinks a beer every few weeks   . Drug Use: Yes    Special: Marijuana     Comment: hx of as teenager   . Sexual Activity: Yes   Other Topics Concern  . Not on file   Social History Narrative    Family History  Problem Relation Age of Onset  . Colon cancer Neg Hx   . Heart disease Father   . Cancer Father     throat ca  . Heart disease Mother     Current Outpatient Prescriptions  Medication Sig Dispense Refill  . aspirin 81 MG tablet Take 81 mg by mouth daily.     . cetirizine (ZYRTEC) 10 MG tablet Take 10 mg by mouth daily.    . fluticasone (FLONASE) 50 MCG/ACT nasal spray Place 2 sprays into both nostrils daily as needed. 16 g 11  . lisinopril-hydrochlorothiazide (PRINZIDE,ZESTORETIC) 10-12.5 MG per tablet Take 1 tablet by mouth daily. 90 tablet 3  . oxyCODONE-acetaminophen (ROXICET) 5-325 MG per tablet Take 1-2 tablets by mouth every 4 (four) hours as needed for severe pain. 40 tablet 0   No current facility-administered medications for this visit.     Allergies  Allergen Reactions  . Codeine Nausea And Vomiting    @VS @  Dg Chest 2 View  03/21/2015   CLINICAL DATA:  Preoperative examination ; history of heart murmur  EXAM: CHEST  2 VIEW  COMPARISON:  Portable chest x-ray dated December 14, 2010  FINDINGS: The lungs are adequately inflated and clear. The heart and pulmonary vascularity are normal. The mediastinum is normal in width. The trachea is midline. There is no pleural effusion. The bony thorax is unremarkable.  IMPRESSION: There is no active  cardiopulmonary disease.   Electronically Signed   By: David  [place] M.D.   On: 03/21/2015 13:32    Note: This dictation was prepared with Dragon/digital dictation along with [employer]. Any transcriptional errors that result from this process are unintentional.

## 2015-04-01 NOTE — H&P (Signed)
PCP:  Wyatt Haste, MD  General surgery Dr. Johney Maine  Referring provider Verline Lema PA   Chief Complaint: Blurred vision  HPI: Lee Barnes is a 64 y.o. 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   Presented with blurred vision started since his discharge from the hospital. Patient apparently undergone bilateral hernia repair on May 12. Patient's postoperative stay was complicated by a syncopal event foot to be vasovagal after patient was trying to ambulate postoperatively. Throughout hospital stay patient continued to feel somewhat lightheaded every time we try to get up.  He was admitted overnight and observed. Perioperatively he'll also have had some diminished urine output. Since his discharge he had decreased by mouth intake hasn't been taking his Percocet only been taking Tylenol for pain. Today developed blurred vision after he woke up from a nap around 2:30 PM. He reports while watching TV he noted that it looked a bit blurry. His wife has called to the surgical's office reporting the patient has been feeling lightheaded have had some blurred vision. Patient and wife denies any weakness, Slurred speech, numbness. Given no signs worrisome for stroke or TIA the plan was to observe the patient at home to see how he is feeling but since his symptoms persisted Wife  called patient's primary care provider who recommended coming to ER. In the ER CT scan of the head was unremarkable. But electrolytes was noted. He will 121 which is different from prior. Of note patient is has been started on lisinopril and  Hydrochlorothiazide for the past 10 days.  He denies any chest pain no confusion, feels now much better.  Review of records patient have had somewhat low sodiums in the past  as well as 133 In emergency department and urine lites were obtained and then patient was given IV fluids   Hospitalist was called for admission for hyponatremia  Review of Systems:    Pertinent positives include:  Blurred vision feeling unwell, constipation  Constitutional:  No weight loss, night sweats, Fevers, chills, fatigue, weight loss  HEENT:  No headaches, Difficulty swallowing,Tooth/dental problems,Sore throat,  No sneezing, itching, ear ache, nasal congestion, post nasal drip,  Cardio-vascular:  No chest pain, Orthopnea, PND, anasarca, dizziness, palpitations.no Bilateral lower extremity swelling  GI:  No heartburn, indigestion, abdominal pain, nausea, vomiting, diarrhea, change in bowel habits, loss of appetite, melena, blood in stool, hematemesis Resp:  no shortness of breath at rest. No dyspnea on exertion, No excess mucus, no productive cough, No non-productive cough, No coughing up of blood.No change in color of mucus. No wheezing. Skin:  no rash or lesions. No jaundice GU:  no dysuria, change in color of urine, no urgency or frequency. No straining to urinate.  No flank pain.  Musculoskeletal:  No joint pain or no joint swelling. No decreased range of motion. No back pain.  Psych:  No change in mood or affect. No depression or anxiety. No memory loss.  Neuro: no localizing neurological complaints, no tingling, no weakness, no double vision, no gait abnormality, no slurred speech, no confusion  Otherwise ROS are negative except for above, 10 systems were reviewed  Past Medical History: Past Medical History  Diagnosis Date  . Diverticulosis 2008  . Allergy to environmental factors   . History of radiation therapy 11/05/10-12/26/10    r base tongue, 7000 cGy  35 sessions  . 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 Heart murmur     hx of in childhood   . Pneumonia     hx of 2012  . History of chemotherapy   . Tinnitus      Past Surgical History  Procedure Laterality Date  . Appendectomy    . Inguinal hernia repair Right 1960  . Lumbar disc surgery    . Tonsillectomy    . Colonscopy     . Inguinal hernia repair Bilateral 03/30/2015    Procedure: LAPAROSCOPIC BILATERAL INGUINAL HERNIA REPAIR WITH MESH;  Surgeon: Coralie Keens, MD;  Location: [facility];  Service: General;  Laterality: Bilateral;     Medications: Prior to Admission medications   Medication Sig Start Date End Date Taking? Authorizing Provider  acetaminophen (TYLENOL) 500 MG tablet Take 500 mg by mouth every 6 (six) hours as needed (discomfort).   Yes Historical Provider, MD  aspirin 81 MG tablet Take 81 mg by mouth daily.    Yes Historical Provider, MD  cetirizine (ZYRTEC) 10 MG tablet Take 10 mg by mouth daily as needed for allergies.    Yes Historical Provider, MD  fluticasone (FLONASE) 50 MCG/ACT nasal spray Place 2 sprays into both nostrils daily as needed. Patient taking differently: Place 2 sprays into both nostrils daily as needed for allergies.  12/14/13  Yes Denita Lung, MD  ibuprofen (ADVIL,MOTRIN) 200 MG tablet Take 600 mg by mouth every 6 (six) hours as needed (discomfort).   Yes Historical Provider, MD  lisinopril-hydrochlorothiazide (PRINZIDE,ZESTORETIC) 10-12.5 MG per tablet Take 1 tablet by mouth daily. 03/21/15  Yes Denita Lung, MD  oxyCODONE-acetaminophen (ROXICET) 5-325 MG per tablet Take 1-2 tablets by mouth every 4 (four) hours as needed for severe pain. 03/30/15  Yes Coralie Keens, MD    Allergies:   Allergies  Allergen Reactions  . Codeine Nausea And Vomiting    Social History:  Ambulatory  independently   Lives at home  With family     reports that he has never smoked. He has never used smokeless tobacco. He reports that he drinks alcohol. He reports that he does not use illicit drugs.    Family History: family history includes Cancer in his father; Heart disease in his father and mother. There is no  history of Colon cancer.    Physical Exam: Patient Vitals for the past 24 hrs:  BP Temp Temp src Pulse Resp SpO2 Height Weight  04/01/15 2249 136/76 mmHg - - 73 18 97 % - -  04/01/15 2024 139/76 mmHg - - 74 18 96 % - -  04/01/15 1925 163/87 mmHg 97.8 F (36.6 C) Oral 78 18 98 % 6' (1.829 m) 78.019 kg (172 lb)    1. General:  in No Acute distress 2. Psychological: Alert and  Oriented 3. Head/ENT:     Dry Mucous Membranes                          Head Non traumatic, neck supple                          Normal   Dentition 4. SKIN:   decreased Skin turgor,  Skin clean Dry and intact no rash 5. Heart: Regular rate and rhythm no Murmur, Rub or gallop 6. Lungs: Clear to auscultation bilaterally, no wheezes or  crackles   7. Abdomen: Soft, mild expected tenderness, Non distended 8. Lower extremities: no clubbing, cyanosis, or edema 9. Neurologically Grossly intact, moving all 4 extremities equally 10. MSK: Normal range of motion  body mass index is 23.32 kg/(m^2).   Labs on Admission:   Results for orders placed or performed during the hospital encounter of 04/01/15 (from the past 24 hour(s))  CBC with Differential/Platelet     Status: Abnormal   Collection Time: 04/01/15  7:52 PM  Result Value Ref Range   WBC 8.1 4.0 - 10.5 K/uL   RBC 3.48 (L) 4.22 - 5.81 MIL/uL   Hemoglobin 10.1 (L) 13.0 - 17.0 g/dL   HCT 28.9 (L) 39.0 - 52.0 %   MCV 83.0 78.0 - 100.0 fL   MCH 29.0 26.0 - 34.0 pg   MCHC 34.9 30.0 - 36.0 g/dL   RDW 13.2 11.5 - 15.5 %   Platelets 180 150 - 400 K/uL   Neutrophils Relative % 75 43 - 77 %   Neutro Abs 6.1 1.7 - 7.7 K/uL   Lymphocytes Relative 8 (L) 12 - 46 %   Lymphs Abs 0.6 (L) 0.7 - 4.0 K/uL   Monocytes Relative 15 (H) 3 - 12 %   Monocytes Absolute 1.2 (H) 0.1 - 1.0 K/uL   Eosinophils Relative 2 0 - 5 %   Eosinophils Absolute 0.1 0.0 - 0.7 K/uL   Basophils Relative 0 0 - 1 %   Basophils Absolute 0.0 0.0 - 0.1 K/uL  Basic metabolic panel     Status: Abnormal    Collection Time: 04/01/15  7:52 PM  Result Value Ref Range   Sodium 121 (L) 135 - 145 mmol/L   Potassium 4.4 3.5 - 5.1 mmol/L   Chloride 88 (L) 101 - 111 mmol/L   CO2 22 22 - 32 mmol/L   Glucose, Bld 111 (H) 65 - 99 mg/dL   BUN 14 6 - 20 mg/dL   Creatinine, Ser 0.89 0.61 - 1.24 mg/dL   Calcium 8.6 (L) 8.9 - 10.3 mg/dL   GFR calc non Af Amer >60 >60 mL/min   GFR calc Af Amer >60 >60 mL/min   Anion gap 11 5 - 15    UA ordered currently pending  No results found for: HGBA1C  Estimated Creatinine Clearance: 93.2 mL/min (by C-G formula based on Cr of 0.89).  BNP (last 3 results) No results for input(s): PROBNP in the last 8760 hours.  Other results:  I have pearsonaly reviewed this: ECG REPORT  Rate: 79  Rhythm: Normal sinus rhythm ST&T Change: No ischemic changes PTCA within normal limits  Filed Weights   04/01/15 1925  Weight: 78.019 kg (172 lb)    Cultures:    Component Value Date/Time   SDES BLOOD RIGHT ARM 12/14/2010 1340   SPECREQUEST BOTTLES DRAWN AEROBIC AND ANAEROBIC  10ML 12/14/2010 1340   CULT NO GROWTH 5 DAYS 12/14/2010 1340   REPTSTATUS 12/20/2010 FINAL 12/14/2010 1340     Radiological Exams on Admission: Ct Head Wo Contrast  04/01/2015   CLINICAL DATA:  64 year old male with acute blurred vision today. History of nasopharyngeal cancer.  EXAM: CT HEAD WITHOUT CONTRAST  TECHNIQUE: Contiguous axial images were obtained from the base of the skull through the vertex without intravenous contrast.  COMPARISON:  None.  FINDINGS: No intracranial abnormalities are identified, including mass lesion or mass effect, hydrocephalus, extra-axial fluid collection, midline shift, hemorrhage, or acute infarction.  The visualized bony calvarium is unremarkable.  IMPRESSION: Unremarkable  noncontrast head CT.   Electronically Signed   By: Margarette Canada M.D.   On: 04/01/2015 21:15    Chart has been reviewed  Family not  at  Bedside    Assessment/Plan 64 year old gentleman  with history of essential hypertension who was recently started on lisinopril hydrochlorothiazide and then undergoing hernia repair complicated by episode of vasovagal syncope presents with mild blurred vision after he woke up from the nap but currently resolved on presentation to emergency department was found to have a sodium 121 patient was given IV fluids  Present on Admission:  . Hyponatremia - most likely multifactorial combination of recent initiation of lisinopril hydrochlorothiazide. As well as recent surgery and fluid shifts. Patient have had symptoms of lightheadedness upon standing up as recorded during his hospitalization. Will admit admit administer IV fluids. Obtain urine electrolytes. Monitor frequent electrolytes monitor sodium levels to avoid rapid change. Check orthostatics  . Essential hypertension hold the cerebral hydrochlorothiazide as this probably contributed to hyponatremia currently blood pressure stable Blurred vision since was transient and mild nausea associated neurological symptoms. Possibly in the setting of hyponatremia and mild dehydration Patient have had syncope in the past 48 hours while hospitalized - will observe on telemetry obtain troponin currently no chest pain EKG appears to be nonischemic. Most likely syncope was in the setting of vasovagal syncope and some dehydration after recent vertebral intervention decreased by mouth intake patient prior to this was on daily aspirin. We'll hold off for now as patient was continued to have some bruising around surgical site.   Prophylaxis: SCD   CODE STATUS:  FULL CODE as per patient    Disposition: To home once workup is complete and patient is stable  Other plan as per orders.  I have spent a total of 55 min on this admission  Cleotha Whalin 04/01/2015, 11:02 PM  [facility]  Pager [phone]   after 2 AM please page floor coverage PA If 7AM-7PM, please contact the day team taking care of the  patient  [email or web address]  Password [email or web address]

## 2015-04-01 NOTE — ED Provider Notes (Signed)
CSN: [id number]     Arrival date & time 04/01/15  1913 History   First MD Initiated Contact with Patient 04/01/15 1934     Chief Complaint  Patient presents with  . Blurred Vision     (Consider location/radiation/quality/duration/timing/severity/associated sxs/prior Treatment) HPI Comments: Patient is a 64 year old male who is 2 days s/p bilateral inguinal hernia repair who presents with blurred vision that started after surgery but worsened when he woke up from a nap around 2:30pm. Patient denies any pain associated with the blurry vision and it is equal in both eyes. He denies any other symptoms such as numbness and tingling. Patient denies taking narcotics after surgery and has been treating his pain with tylenol. No aggravating/alleviating factors. No other associated symptoms.    Past Medical History  Diagnosis Date  . Diverticulosis 2008  . Allergy to environmental factors   . History of radiation therapy 11/05/10-12/26/10    r base tongue, 7000 cGy 35 sessions  . 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 Heart murmur     hx of in childhood   . Pneumonia     hx of 2012  . History of chemotherapy   . Tinnitus    Past Surgical History  Procedure Laterality Date  . Appendectomy    . Inguinal hernia repair Right 1960  . Lumbar disc surgery    . Tonsillectomy    . Colonscopy     . Inguinal hernia repair Bilateral 03/30/2015    Procedure: LAPAROSCOPIC BILATERAL INGUINAL HERNIA REPAIR WITH MESH;  Surgeon: Coralie Keens, MD;  Location: [facility];  Service: General;  Laterality: Bilateral;   Family History  Problem Relation Age of Onset  . Colon cancer Neg Hx   . Heart disease Father   . Cancer Father     throat ca  . Heart disease Mother    History  Substance Use Topics  . Smoking status: Never Smoker   . Smokeless tobacco: Never Used  . Alcohol Use: Yes     Comment: occas drinks a beer every few weeks     Review of Systems   Constitutional: Negative for fever, chills and fatigue.  HENT: Negative for trouble swallowing.   Eyes: Positive for visual disturbance.  Respiratory: Negative for shortness of breath.   Cardiovascular: Negative for chest pain and palpitations.  Gastrointestinal: Negative for nausea, vomiting, abdominal pain and diarrhea.  Genitourinary: Negative for dysuria and difficulty urinating.  Musculoskeletal: Negative for arthralgias and neck pain.  Skin: Negative for color change.  Neurological: Negative for dizziness and weakness.  Psychiatric/Behavioral: Negative for dysphoric mood.      Allergies  Codeine  Home Medications   Prior to Admission medications   Medication Sig Start Date End Date Taking? Authorizing Provider  acetaminophen (TYLENOL) 500 MG tablet Take 500 mg by mouth every 6 (six) hours as needed (discomfort).   Yes Historical Provider, MD  aspirin 81 MG tablet Take 81 mg by mouth daily.    Yes Historical Provider, MD  cetirizine (ZYRTEC) 10 MG tablet Take 10 mg by mouth daily as needed for allergies.    Yes Historical Provider, MD  fluticasone (FLONASE) 50 MCG/ACT nasal spray Place 2 sprays into both nostrils daily as needed. Patient taking differently: Place 2 sprays into both nostrils daily as needed for allergies.  12/14/13  Yes Denita Lung, MD  ibuprofen (ADVIL,MOTRIN) 200 MG tablet Take 600 mg by mouth every  6 (six) hours as needed (discomfort).   Yes Historical Provider, MD  lisinopril-hydrochlorothiazide (PRINZIDE,ZESTORETIC) 10-12.5 MG per tablet Take 1 tablet by mouth daily. 03/21/15  Yes Denita Lung, MD  oxyCODONE-acetaminophen (ROXICET) 5-325 MG per tablet Take 1-2 tablets by mouth every 4 (four) hours as needed for severe pain. 03/30/15  Yes Coralie Keens, MD   BP 139/76 mmHg  Pulse 74  Temp(Src) 97.8 F (36.6 C) (Oral)  Resp 18  Ht 6' (1.829 m)  Wt 172 lb (78.019 kg)  BMI 23.32 kg/m2  SpO2 96% Physical Exam  Constitutional: He is oriented to  person, place, and time. He appears well-developed and well-nourished. No distress.  HENT:  Head: Normocephalic and atraumatic.  Eyes: Conjunctivae and EOM are normal. Pupils are equal, round, and reactive to light.  Neck: Normal range of motion.  Cardiovascular: Normal rate and regular rhythm.  Exam reveals no gallop and no friction rub.   No murmur heard. Pulmonary/Chest: Effort normal and breath sounds normal. He has no wheezes. He has no rales. He exhibits no tenderness.  Abdominal: Soft. He exhibits no distension. There is no tenderness. There is no rebound.  Musculoskeletal: Normal range of motion.  Neurological: He is alert and oriented to person, place, and time. No cranial nerve deficit. Coordination normal.  Speech is goal-oriented. Moves limbs without ataxia.   Skin: Skin is warm and dry.  Psychiatric: He has a normal mood and affect. His behavior is normal.  Nursing note and vitals reviewed.   ED Course  Procedures (including critical care time) Labs Review Labs Reviewed  CBC WITH DIFFERENTIAL/PLATELET - Abnormal; Notable for the following:    RBC 3.48 (*)    Hemoglobin 10.1 (*)    HCT 28.9 (*)    Lymphocytes Relative 8 (*)    Lymphs Abs 0.6 (*)    Monocytes Relative 15 (*)    Monocytes Absolute 1.2 (*)    All other components within normal limits  BASIC METABOLIC PANEL - Abnormal; Notable for the following:    Sodium 121 (*)    Chloride 88 (*)    Glucose, Bld 111 (*)    Calcium 8.6 (*)    All other components within normal limits    Imaging Review Ct Head Wo Contrast  04/01/2015   CLINICAL DATA:  64 year old male with acute blurred vision today. History of nasopharyngeal cancer.  EXAM: CT HEAD WITHOUT CONTRAST  TECHNIQUE: Contiguous axial images were obtained from the base of the skull through the vertex without intravenous contrast.  COMPARISON:  None.  FINDINGS: No intracranial abnormalities are identified, including mass lesion or mass effect, hydrocephalus,  extra-axial fluid collection, midline shift, hemorrhage, or acute infarction.  The visualized bony calvarium is unremarkable.  IMPRESSION: Unremarkable noncontrast head CT.   Electronically Signed   By: Margarette Canada M.D.   On: 04/01/2015 21:15     EKG Interpretation None      MDM   Final diagnoses:  Hyponatremia  Blurry vision, bilateral    Patient will have normal saline and be admitted for hyponatremia.    Alvina Chou, PA-C 04/02/15 0010  Davonna Belling, MD 04/02/15 [phone]

## 2015-04-01 NOTE — ED Notes (Signed)
Pt states he woke up from nap @ 1430 with blurred vision, no neuro deficits. Pt did have bilat hernia repair here by Dr. Ninfa Linden 2 days ago. Is not taking percocet since he was in hospital.  Pt states he just feels unwell in general. Wife called, surgeon and PCP, told to come to ED for eval

## 2015-04-01 NOTE — ED Notes (Signed)
Pt to have chest x-ray first before transport to floor unit.

## 2015-04-02 ENCOUNTER — Inpatient Hospital Stay (HOSPITAL_COMMUNITY): Payer: BC Managed Care – PPO

## 2015-04-02 LAB — CBC
HCT: 26.5 % — ABNORMAL LOW (ref 39.0–52.0)
Hemoglobin: 9.2 g/dL — ABNORMAL LOW (ref 13.0–17.0)
MCH: 28.6 pg (ref 26.0–34.0)
MCHC: 34.7 g/dL (ref 30.0–36.0)
MCV: 82.3 fL (ref 78.0–100.0)
Platelets: 163 10*3/uL (ref 150–400)
RBC: 3.22 MIL/uL — ABNORMAL LOW (ref 4.22–5.81)
RDW: 12.9 % (ref 11.5–15.5)
WBC: 6.2 10*3/uL (ref 4.0–10.5)

## 2015-04-02 LAB — CORTISOL: Cortisol, Plasma: 5 ug/dL

## 2015-04-02 LAB — BASIC METABOLIC PANEL
Anion gap: 9 (ref 5–15)
BUN: 10 mg/dL (ref 6–20)
CO2: 23 mmol/L (ref 22–32)
Calcium: 8.8 mg/dL — ABNORMAL LOW (ref 8.9–10.3)
Chloride: 88 mmol/L — ABNORMAL LOW (ref 101–111)
Creatinine, Ser: 0.85 mg/dL (ref 0.61–1.24)
GFR calc Af Amer: >60 mL/min (ref >60)
GFR calc non Af Amer: >60 mL/min (ref >60)
Glucose, Bld: 109 mg/dL — ABNORMAL HIGH (ref 65–99)
Potassium: 4.2 mmol/L (ref 3.5–5.1)
Sodium: 120 mmol/L — ABNORMAL LOW (ref 135–145)

## 2015-04-02 LAB — SODIUM, URINE, RANDOM
Sodium, Ur: 140 mEq/L
Sodium, Ur: 154 meq/L

## 2015-04-02 LAB — COMPREHENSIVE METABOLIC PANEL
ALT: 11 U/L — ABNORMAL LOW (ref 17–63)
AST: 17 U/L (ref 15–41)
Albumin: 3.3 g/dL — ABNORMAL LOW (ref 3.5–5.0)
Alkaline Phosphatase: 64 U/L (ref 38–126)
Anion gap: 11 (ref 5–15)
BUN: 7 mg/dL (ref 6–20)
CO2: 24 mmol/L (ref 22–32)
Calcium: 9 mg/dL (ref 8.9–10.3)
Chloride: 103 mmol/L (ref 101–111)
Creatinine, Ser: 0.77 mg/dL (ref 0.61–1.24)
GFR calc Af Amer: >60 mL/min (ref >60)
GFR calc non Af Amer: >60 mL/min (ref >60)
Glucose, Bld: 132 mg/dL — ABNORMAL HIGH (ref 65–99)
Potassium: 3 mmol/L — ABNORMAL LOW (ref 3.5–5.1)
Sodium: 138 mmol/L (ref 135–145)
Total Bilirubin: 0.4 mg/dL (ref 0.3–1.2)
Total Protein: 7.2 g/dL (ref 6.5–8.1)

## 2015-04-02 LAB — PHOSPHORUS: Phosphorus: 2.3 mg/dL — ABNORMAL LOW (ref 2.5–4.6)

## 2015-04-02 LAB — OSMOLALITY: Osmolality: 283 mOsm/kg (ref 275–300)

## 2015-04-02 LAB — URIC ACID: Uric Acid, Serum: 3.2 mg/dL — ABNORMAL LOW (ref 4.4–7.6)

## 2015-04-02 LAB — BASIC METABOLIC PANEL WITH GFR
Anion gap: 7 (ref 5–15)
BUN: 9 mg/dL (ref 6–20)
CO2: 25 mmol/L (ref 22–32)
Calcium: 8.5 mg/dL — ABNORMAL LOW (ref 8.9–10.3)
Chloride: 87 mmol/L — ABNORMAL LOW (ref 101–111)
Creatinine, Ser: 0.85 mg/dL (ref 0.61–1.24)
GFR calc Af Amer: >60 mL/min
GFR calc non Af Amer: >60 mL/min
Glucose, Bld: 142 mg/dL — ABNORMAL HIGH (ref 65–99)
Potassium: 4.7 mmol/L (ref 3.5–5.1)
Sodium: 119 mmol/L — CL (ref 135–145)

## 2015-04-02 LAB — CREATININE, URINE, RANDOM: Creatinine, Urine: 150.6 mg/dL

## 2015-04-02 LAB — TROPONIN I
Troponin I: <0.03 ng/mL (ref <0.031)
Troponin I: <0.03 ng/mL (ref <0.031)
Troponin I: <0.03 ng/mL (ref <0.031)

## 2015-04-02 LAB — OSMOLALITY, URINE
Osmolality, Ur: 635 mOsm/kg (ref 390–1090)
Osmolality, Ur: 421 mosm/kg (ref 390–1090)

## 2015-04-02 LAB — TSH: TSH: 2.856 u[IU]/mL (ref 0.350–4.500)

## 2015-04-02 LAB — URINE CULTURE
Colony Count: NO GROWTH
Culture: NO GROWTH
Special Requests: NORMAL

## 2015-04-02 LAB — MAGNESIUM: Magnesium: 1.7 mg/dL (ref 1.7–2.4)

## 2015-04-02 MED ORDER — POTASSIUM CHLORIDE 10 MEQ/100ML IV SOLN
10.0000 meq | INTRAVENOUS | Status: AC
  Administered 2015-04-02 (×3): 10 meq via INTRAVENOUS
  Filled 2015-04-02 (×3): qty 100

## 2015-04-02 MED ORDER — LORATADINE 10 MG PO TABS
10.0000 mg | ORAL_TABLET | Freq: Every day | ORAL | Status: DC
  Administered 2015-04-02 – 2015-04-05 (×4): 10 mg via ORAL
  Filled 2015-04-02 (×4): qty 1

## 2015-04-02 MED ORDER — POLYETHYLENE GLYCOL 3350 17 G PO PACK: 17.0000 g | PACK | Freq: Every day | ORAL | Status: DC | PRN

## 2015-04-02 MED ORDER — COSYNTROPIN 0.25 MG IJ SOLR
0.2500 mg | Freq: Once | INTRAMUSCULAR | Status: AC
  Administered 2015-04-03: 0.25 mg via INTRAVENOUS
  Filled 2015-04-02: qty 0.25

## 2015-04-02 MED ORDER — ONDANSETRON HCL 4 MG/2ML IJ SOLN
4.0000 mg | Freq: Four times a day (QID) | INTRAMUSCULAR | Status: DC | PRN
  Administered 2015-04-02: 4 mg via INTRAVENOUS
  Filled 2015-04-02: qty 2

## 2015-04-02 MED ORDER — SODIUM CHLORIDE 0.9 % IV SOLN
INTRAVENOUS | Status: DC
  Administered 2015-04-02: 13:00:00 via INTRAVENOUS

## 2015-04-02 MED ORDER — SODIUM CHLORIDE 0.45 % IV SOLN
INTRAVENOUS | Status: DC
  Administered 2015-04-02: 75 mL/h via INTRAVENOUS

## 2015-04-02 MED ORDER — OXYCODONE-ACETAMINOPHEN 5-325 MG PO TABS: 1.0000 | ORAL_TABLET | ORAL | Status: DC | PRN

## 2015-04-02 MED ORDER — SENNA 8.6 MG PO TABS
1.0000 | ORAL_TABLET | Freq: Two times a day (BID) | ORAL | Status: DC
  Administered 2015-04-02 – 2015-04-04 (×4): 8.6 mg via ORAL
  Filled 2015-04-02 (×5): qty 1

## 2015-04-02 MED ORDER — SODIUM CHLORIDE 0.9 % IV SOLN: INTRAVENOUS | Status: DC

## 2015-04-02 MED ORDER — HYDRALAZINE HCL 20 MG/ML IJ SOLN
10.0000 mg | Freq: Four times a day (QID) | INTRAMUSCULAR | Status: DC | PRN
Start: 2015-04-02 — End: 2015-04-05
  Administered 2015-04-02: 10 mg via INTRAVENOUS
  Filled 2015-04-02: qty 1

## 2015-04-02 MED ORDER — MAGNESIUM OXIDE 400 (241.3 MG) MG PO TABS
400.0000 mg | ORAL_TABLET | Freq: Every day | ORAL | Status: DC
  Administered 2015-04-02 – 2015-04-05 (×4): 400 mg via ORAL
  Filled 2015-04-02 (×4): qty 1

## 2015-04-02 MED ORDER — ONDANSETRON HCL 4 MG PO TABS: 4.0000 mg | ORAL_TABLET | Freq: Four times a day (QID) | ORAL | Status: DC | PRN

## 2015-04-02 MED ORDER — LORAZEPAM 2 MG/ML IJ SOLN
0.5000 mg | Freq: Four times a day (QID) | INTRAMUSCULAR | Status: DC | PRN
  Administered 2015-04-02: 0.5 mg via INTRAVENOUS
  Filled 2015-04-02: qty 1

## 2015-04-02 MED ORDER — ACETAMINOPHEN 650 MG RE SUPP: 650.0000 mg | Freq: Four times a day (QID) | RECTAL | Status: DC | PRN

## 2015-04-02 MED ORDER — DEXTROSE 5 % IV SOLN
INTRAVENOUS | Status: DC
  Administered 2015-04-02: 110 mL via INTRAVENOUS

## 2015-04-02 MED ORDER — POTASSIUM CHLORIDE CRYS ER 20 MEQ PO TBCR
40.0000 meq | EXTENDED_RELEASE_TABLET | Freq: Once | ORAL | Status: AC
  Administered 2015-04-02: 40 meq via ORAL
  Filled 2015-04-02: qty 2

## 2015-04-02 MED ORDER — SODIUM CHLORIDE 0.9 % IJ SOLN
3.0000 mL | Freq: Two times a day (BID) | INTRAMUSCULAR | Status: DC
  Administered 2015-04-02: 3 mL via INTRAVENOUS

## 2015-04-02 MED ORDER — SODIUM CHLORIDE 0.9 % IV SOLN
INTRAVENOUS | Status: DC
  Administered 2015-04-02: 75 mL/h via INTRAVENOUS

## 2015-04-02 MED ORDER — DOCUSATE SODIUM 100 MG PO CAPS: 100.0000 mg | ORAL_CAPSULE | Freq: Two times a day (BID) | ORAL | Status: DC

## 2015-04-02 MED ORDER — ACETAMINOPHEN 325 MG PO TABS: 650.0000 mg | ORAL_TABLET | Freq: Four times a day (QID) | ORAL | Status: DC | PRN

## 2015-04-02 NOTE — Progress Notes (Addendum)
Patient Demographics  Lee Barnes, is a 64 y.o. male, DOB - 1951-11-09, GLO:[id number]  Admit date - 04/01/2015   Admitting Physician Therisa Doyne, MD  Outpatient Primary MD for the patient is Carollee Herter, MD  LOS - 1   Chief Complaint  Patient presents with  . Blurred Vision        Subjective:   Lee Barnes today has, No headache, No chest pain, No abdominal pain - No Nausea, No new weakness tingling or numbness, No Cough - SOB. Does have blurry vision bilaterally.  Assessment & Plan    1. Hyponatremia. Subacute, caused by combination of decreased oral intake (recent hernia surgery) and diuretic (started last week) - serum osmolality, urine sodium and osmolality are pending, his sodium had rapidly corrected overnight after some normal saline administration, this was reversed by D5W. Sodium is back to 121. He has no focal deficits. Mentation is clear. We'll start him on normal saline at 50 mL an hour with frequent BMB monitoring.   Addendum - 3pm Na 119, Ur Osm >>> Serum Osm, likely SIADH, hold IVF, Fluid restrict, repeat BMP. If repeat Na drops call Renal for phone input. Likely 2nd Na of 138 was false ??    2. Bilateral Blurry vision. This was the reason for hospital visit, thought to be secondary to metabolic encephalopathy especially since its bilateral, discussed the case with neurologist Dr. Amada Jupiter over the phone, will get MRI of the brain basically to rule out PRES, also ophthalmology has been consulted. Note this problem was the admitting diagnosis and initial complaint for the patient.   3. Recent hernia repair surgery - no acute issues, he has some abdominal wall bruising which is stable. Supportive care.   4. Essential hypertension. Blood pressure stable  monitor. On no medications currently.   5. History of oropharyngeal cancer. Treated 4 years ago. Outpatient follow-up with PCP and oncologist as needed.    Code Status: Full  Family Communication: wife bedside  Disposition Plan: Home likely 1-2 days   Consults    Ophthalmology Dr.Graot     Procedures   CT Head - Stable   DVT Prophylaxis   SCDs   Lab Results  Component Value Date   PLT 163 04/02/2015    Medications  Scheduled Meds: . loratadine  10 mg Oral Daily  . magnesium oxide  400 mg Oral Daily  . senna  1 tablet Oral BID  . sodium chloride  3 mL Intravenous Q12H   Continuous Infusions: . sodium chloride     PRN Meds:.acetaminophen **OR** acetaminophen, LORazepam, ondansetron **OR** ondansetron (ZOFRAN) IV, oxyCODONE-acetaminophen, polyethylene glycol  Antibiotics    Anti-infectives    None        Objective:   Filed Vitals:   04/01/15 2249 04/02/15 0010 04/02/15 0449 04/02/15 0910  BP: 136/76 166/94 140/83 129/65  Pulse: 73 76 79 89  Temp:  97.8 F (36.6 C) 97.9 F (36.6 C)   TempSrc:  Oral Oral   Resp: 18 18 18 18   Height:  6' (1.829 m)    Weight:  81.375 kg (179 lb 6.4 oz)    SpO2: 97% 99% 97% 98%    Wt Readings from Last 3 Encounters:  04/02/15 81.375 kg (179 lb  6.4 oz)  03/30/15 80.003 kg (176 lb 6 oz)  03/21/15 80.015 kg (176 lb 6.4 oz)     Intake/Output Summary (Last 24 hours) at 04/02/15 1232 Last data filed at 04/02/15 0930  Gross per 24 hour  Intake 2073.75 ml  Output   3000 ml  Net -926.25 ml     Physical Exam  Awake Alert, Oriented X 3, No new F.N deficits, Normal affect [place].AT,PERRAL Supple Neck,No JVD, No cervical lymphadenopathy appriciated.  Symmetrical Chest wall movement, Good air movement bilaterally, CTAB RRR,No Gallops,Rubs or new Murmurs, No Parasternal Heave +ve B.Sounds, Abd Soft, No tenderness, No organomegaly appriciated, No rebound - guarding or rigidity. No Cyanosis, Clubbing or edema, No new  Rash , does have abdominal wall bruise stable per wife   Data Review   Micro Results No results found for this or any previous visit (from the past 240 hour(s)).  Radiology Reports Dg Chest 2 View  04/01/2015   CLINICAL DATA:  Acute onset of blurred vision. Recent inguinal hernia repair. Initial encounter.  EXAM: CHEST  2 VIEW  COMPARISON:  Chest radiograph performed 03/21/2015  FINDINGS: The lungs are well-aerated. There is mild elevation of the right hemidiaphragm. There is no evidence of focal opacification, pleural effusion or pneumothorax.  The heart is normal in size; the mediastinal contour is within normal limits. No acute osseous abnormalities are seen.  IMPRESSION: Mild elevation of the right hemidiaphragm; lungs remain grossly clear.   Electronically Signed   By: Roanna Raider M.D.   On: 04/01/2015 23:30   Dg Chest 2 View  03/21/2015   CLINICAL DATA:  Preoperative examination ; history of heart murmur  EXAM: CHEST  2 VIEW  COMPARISON:  Portable chest x-ray dated December 14, 2010  FINDINGS: The lungs are adequately inflated and clear. The heart and pulmonary vascularity are normal. The mediastinum is normal in width. The trachea is midline. There is no pleural effusion. The bony thorax is unremarkable.  IMPRESSION: There is no active cardiopulmonary disease.   Electronically Signed   By: David  [place] M.D.   On: 03/21/2015 13:32   Ct Head Wo Contrast  04/01/2015   CLINICAL DATA:  64 year old male with acute blurred vision today. History of nasopharyngeal cancer.  EXAM: CT HEAD WITHOUT CONTRAST  TECHNIQUE: Contiguous axial images were obtained from the base of the skull through the vertex without intravenous contrast.  COMPARISON:  None.  FINDINGS: No intracranial abnormalities are identified, including mass lesion or mass effect, hydrocephalus, extra-axial fluid collection, midline shift, hemorrhage, or acute infarction.  The visualized bony calvarium is unremarkable.  IMPRESSION:  Unremarkable noncontrast head CT.   Electronically Signed   By: Harmon Pier M.D.   On: 04/01/2015 21:15     CBC  Recent Labs Lab 03/31/15 0912 04/01/15 1952 04/02/15 0710  WBC 9.5 8.1 6.2  HGB 10.6* 10.1* 9.2*  HCT 30.4* 28.9* 26.5*  PLT 201 180 163  MCV 83.5 83.0 82.3  MCH 29.1 29.0 28.6  MCHC 34.9 34.9 34.7  RDW 13.3 13.2 12.9  LYMPHSABS 0.4* 0.6*  --   MONOABS 1.2* 1.2*  --   EOSABS 0.0 0.1  --   BASOSABS 0.0 0.0  --     Chemistries   Recent Labs Lab 04/01/15 1952 04/02/15 0139 04/02/15 0710 04/02/15 0855  NA 121* 138  --  121*  K 4.4 3.0*  --  4.7  CL 88* 103  --  90*  CO2 22 24  --  24  GLUCOSE 111* 132*  --  115*  BUN 14 7  --  10  CREATININE 0.89 0.77  --  0.77  CALCIUM 8.6* 9.0  --  8.7*  MG  --   --  1.7  --   AST  --  17  --   --   ALT  --  11*  --   --   ALKPHOS  --  64  --   --   BILITOT  --  0.4  --   --    ------------------------------------------------------------------------------------------------------------------ estimated creatinine clearance is 103.7 mL/min (by C-G formula based on Cr of 0.77). ------------------------------------------------------------------------------------------------------------------ No results for input(s): HGBA1C in the last 72 hours. ------------------------------------------------------------------------------------------------------------------ No results for input(s): CHOL, HDL, LDLCALC, TRIG, CHOLHDL, LDLDIRECT in the last 72 hours. ------------------------------------------------------------------------------------------------------------------  Recent Labs  04/02/15 0710  TSH 2.856   ------------------------------------------------------------------------------------------------------------------ No results for input(s): VITAMINB12, FOLATE, FERRITIN, TIBC, IRON, RETICCTPCT in the last 72 hours.  Coagulation profile No results for input(s): INR, PROTIME in the last 168 hours.  No results for  input(s): DDIMER in the last 72 hours.  Cardiac Enzymes  Recent Labs Lab 04/01/15 2326 04/02/15 0855  TROPONINI <0.03 <0.03   ------------------------------------------------------------------------------------------------------------------ Invalid input(s): POCBNP   Time Spent in minutes   35   SINGH,PRASHANT K M.D on 04/02/2015 at 12:32 PM  Between 7am to 7pm - Pager - [phone]  After 7pm go to [email or web address] - password [facility]  [facility]   Office  [phone]

## 2015-04-02 NOTE — Progress Notes (Signed)
BMET drawn from 0139 showed Na of 138 (after previous blood draw showed 121) and K of 3.0.  BMET drawn at 0855 showed Na of 121 and K of 4.7. MD paged with new results.  New orders to d/c fluids and will f/u with next blood draw.  Will continue to monitor.

## 2015-04-02 NOTE — Progress Notes (Signed)
CRITICAL VALUE ALERT  Critical value received:  Na 119  Date of notification:  04/02/2015   Time of notification:  9357  Critical value read back:Yes.    Nurse who received alert:  Elwin Sleight  MD notified (1st page):  Dr. Candiss Norse  Time of first page:  1553  MD notified (2nd page):  Time of second page:  Responding MD:  Dr. Candiss Norse  Time MD responded:  1554  New orders received

## 2015-04-02 NOTE — Consult Note (Signed)
Ophthalmology Initial Consult Note  Seger, Jani, 64 y.o. male Date of Service:  04/02/15  Requesting physician: Thurnell Lose, MD  Information Obtained from: patient Chief Complaint:  Blurry vision   HPI/Discussion:  Lee Barnes is a 63 y.o. male who is currently admitted for hyponatremia. He underwent laparoscopic inguinal hernia repair several days ago. Yesterday he noticed his distance vision was a little blurry. Near vision was normal with reading glasses. Nothing seems to improve the condition. Both eyes effected. No eye pain. He feels a little lightheaded.  Past Ocular Hx:  None, uses OTC readers Ocular Meds:  none Family ocular history: Cataracts, mother  Past Medical History  Diagnosis Date  . Diverticulosis 2008  . Allergy to environmental factors   . History of radiation therapy 11/05/10-12/26/10    r base tongue, 7000 cGy 35 sessions  . 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 Heart murmur     hx of in childhood   . Pneumonia     hx of 2012  . History of chemotherapy   . Tinnitus    Past Surgical History  Procedure Laterality Date  . Appendectomy    . Inguinal hernia repair Right 1960  . Lumbar disc surgery    . Tonsillectomy    . Colonscopy     . Inguinal hernia repair Bilateral 03/30/2015    Procedure: LAPAROSCOPIC BILATERAL INGUINAL HERNIA REPAIR WITH MESH;  Surgeon: Coralie Keens, MD;  Location: [facility];  Service: General;  Laterality: Bilateral;    Prior to Admission Meds: Prescriptions prior to admission  Medication Sig Dispense Refill Last Dose  . acetaminophen (TYLENOL) 500 MG tablet Take 500 mg by mouth every 6 (six) hours as needed (discomfort).   04/01/2015 at Unknown time  . aspirin 81 MG tablet Take 81 mg by mouth daily.    Past Month at Unknown time  . cetirizine (ZYRTEC) 10 MG tablet Take 10 mg by mouth daily as needed for allergies.    Past Week at Unknown time  . fluticasone (FLONASE) 50  MCG/ACT nasal spray Place 2 sprays into both nostrils daily as needed. (Patient taking differently: Place 2 sprays into both nostrils daily as needed for allergies. ) 16 g 11 unknown  . ibuprofen (ADVIL,MOTRIN) 200 MG tablet Take 600 mg by mouth every 6 (six) hours as needed (discomfort).   04/01/2015 at Unknown time  . lisinopril-hydrochlorothiazide (PRINZIDE,ZESTORETIC) 10-12.5 MG per tablet Take 1 tablet by mouth daily. 90 tablet 3 04/01/2015 at Unknown time  . oxyCODONE-acetaminophen (ROXICET) 5-325 MG per tablet Take 1-2 tablets by mouth every 4 (four) hours as needed for severe pain. 40 tablet 0 Past Week at Unknown time    Allergies  Allergen Reactions  . Codeine Nausea And Vomiting   History  Substance Use Topics  . Smoking status: Never Smoker   . Smokeless tobacco: Never Used  . Alcohol Use: Yes     Comment: occas drinks a beer every few weeks    Family History  Problem Relation Age of Onset  . Colon cancer Neg Hx   . Heart disease Father   . Cancer Father     throat ca  . Heart disease Mother     ROS: Other than ROS in the HPI, all other systems were negative.  Exam: Temp: 97.9 F (36.6 C) Pulse Rate: 83 BP: (!) 167/93 mmHg Resp: 18 SpO2: 99 %  Visual Acuity:  [place]  cc  ph  Near cc   OD  +   20/20  20/30   OS  +   20/20  20/30     OD OS  Confr Vis Fields FTCF FTCF  EOM (Primary) Full Full  Lids/Lashes WNL WNL  Conjunctiva - Bulbar W&Q W&Q  Conjunctiva - Palpebral               W&Q W&Q  Adnexa  WNL WNL  Pupils  ERRL ERRL  Reaction, Direct Brisk Brisk                 Consensual Brisk Brisk                 RAPD No No  Cornea  Clear Clear  Anterior Chamber D&Q D&Q  Lens:  1+ NSC 1+ NSC  IOP 14 14  Fundus - Dilated? Yes   Optic Disc - C:D Ratio 0.3 0.3                     Appearance  Good color and no edema Good color and no edema                     NF Layer WNL WNL  Post Seg:  Retina                    Vessels WNL WNL                  Vitreous  Clear,  syneresis Clear, syneresis                  Macula Rare drusen Rare drusen, larger drusen nasal to fovea                  Periphery WNL WNL       Neuro:  Oriented to person, place, and time:  Yes Psychiatric:  Mood and Affect Appropriate:  Yes  Labs/imaging: CT brain read as normal  A/P: 63 y.o. 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

## 2015-04-02 NOTE — Progress Notes (Addendum)
Patient was sitting up in the chair and NT was obtaining VS. BP 129/65, Pulse 89. Soon after, patient felt very nauseous.  While RN was retrieving prn antiemetic, NT called out needing assistance.  RN entered room where patient was sitting in the chair, very pale and clammy.  NT and wife stated that patient had about a 30 second episode where his eyes were fixed then rolled back in his head. NT stated that his head then rolled back and legs were quivering.  She also stated that he made a gargling noise briefly.  Patient was responsive when RN entered room.  Pt. Stated "I feel a little better, just really clammy now." BP was rechecked and was 156/85, pulse 72.  After several minutes, patient was put back to bed.  Wife did state that her husband "had a couple of episodes similar to this during last admission from surgery." Pt stated that "sometimes I feel like this when I see blood or have blood taken" and patient had blood drawn ~30 minutes prior to episode. MD was paged with findings with no new orders received and will continue to monitor closely.

## 2015-04-02 NOTE — Progress Notes (Addendum)
NA now 138 but K+ is 3.0.  First troponi <0 .03.  Kathline Magic NP on call and notified.

## 2015-04-03 ENCOUNTER — Inpatient Hospital Stay (HOSPITAL_COMMUNITY)
Admit: 2015-04-03 | Discharge: 2015-04-03 | Disposition: A | Discharge status: Home / Self Care | Payer: BC Managed Care – PPO | Attending: Internal Medicine | Admitting: Internal Medicine

## 2015-04-03 DIAGNOSIS — C109 Malignant neoplasm of oropharynx, unspecified: Secondary | ICD-10-CM

## 2015-04-03 LAB — BASIC METABOLIC PANEL
Anion gap: 7 (ref 5–15)
Anion gap: 9 (ref 5–15)
Anion gap: 9 (ref 5–15)
Anion gap: 9 (ref 5–15)
Anion gap: 9 (ref 5–15)
BUN: 10 mg/dL (ref 6–20)
BUN: 10 mg/dL (ref 6–20)
BUN: 13 mg/dL (ref 6–20)
BUN: 14 mg/dL (ref 6–20)
BUN: 17 mg/dL (ref 6–20)
CO2: 24 mmol/L (ref 22–32)
CO2: 22 mmol/L (ref 22–32)
CO2: 24 mmol/L (ref 22–32)
CO2: 27 mmol/L (ref 22–32)
CO2: 25 mmol/L (ref 22–32)
Calcium: 8.7 mg/dL — ABNORMAL LOW (ref 8.9–10.3)
Calcium: 8.8 mg/dL — ABNORMAL LOW (ref 8.9–10.3)
Calcium: 9.1 mg/dL (ref 8.9–10.3)
Calcium: 9.2 mg/dL (ref 8.9–10.3)
Calcium: 9 mg/dL (ref 8.9–10.3)
Chloride: 90 mmol/L — ABNORMAL LOW (ref 101–111)
Chloride: 89 mmol/L — ABNORMAL LOW (ref 101–111)
Chloride: 88 mmol/L — ABNORMAL LOW (ref 101–111)
Chloride: 88 mmol/L — ABNORMAL LOW (ref 101–111)
Chloride: 89 mmol/L — ABNORMAL LOW (ref 101–111)
Creatinine, Ser: 0.77 mg/dL (ref 0.61–1.24)
Creatinine, Ser: 0.81 mg/dL (ref 0.61–1.24)
Creatinine, Ser: 0.87 mg/dL (ref 0.61–1.24)
Creatinine, Ser: 1 mg/dL (ref 0.61–1.24)
Creatinine, Ser: 1.02 mg/dL (ref 0.61–1.24)
GFR calc Af Amer: >60 mL/min (ref >60)
GFR calc Af Amer: >60 mL/min (ref >60)
GFR calc Af Amer: >60 mL/min (ref >60)
GFR calc Af Amer: >60 mL/min (ref >60)
GFR calc Af Amer: >60 mL/min (ref >60)
GFR calc non Af Amer: >60 mL/min (ref >60)
GFR calc non Af Amer: >60 mL/min (ref >60)
GFR calc non Af Amer: >60 mL/min (ref >60)
GFR calc non Af Amer: >60 mL/min (ref >60)
GFR calc non Af Amer: >60 mL/min (ref >60)
Glucose, Bld: 115 mg/dL — ABNORMAL HIGH (ref 65–99)
Glucose, Bld: 99 mg/dL (ref 65–99)
Glucose, Bld: 109 mg/dL — ABNORMAL HIGH (ref 65–99)
Glucose, Bld: 95 mg/dL (ref 65–99)
Glucose, Bld: 119 mg/dL — ABNORMAL HIGH (ref 65–99)
Potassium: 4.7 mmol/L (ref 3.5–5.1)
Potassium: 4.7 mmol/L (ref 3.5–5.1)
Potassium: 5 mmol/L (ref 3.5–5.1)
Potassium: 4.5 mmol/L (ref 3.5–5.1)
Potassium: 4.7 mmol/L (ref 3.5–5.1)
Sodium: 121 mmol/L — ABNORMAL LOW (ref 135–145)
Sodium: 120 mmol/L — ABNORMAL LOW (ref 135–145)
Sodium: 121 mmol/L — ABNORMAL LOW (ref 135–145)
Sodium: 124 mmol/L — ABNORMAL LOW (ref 135–145)
Sodium: 123 mmol/L — ABNORMAL LOW (ref 135–145)

## 2015-04-03 LAB — URIC ACID, RANDOM URINE: Uric Acid, Urine: 20.8 mg/dL

## 2015-04-03 LAB — ACTH STIMULATION, 3 TIME POINTS
Cortisol, 30 Min: 27.5 ug/dL
Cortisol, 60 Min: 33.1 ug/dL
Cortisol, Base: 16.4 ug/dL

## 2015-04-03 LAB — MAGNESIUM: Magnesium: 1.9 mg/dL (ref 1.7–2.4)

## 2015-04-03 MED ORDER — FUROSEMIDE 20 MG PO TABS
20.0000 mg | ORAL_TABLET | Freq: Once | ORAL | Status: AC
  Administered 2015-04-03: 20 mg via ORAL
  Filled 2015-04-03: qty 1

## 2015-04-03 NOTE — Care Management Note (Signed)
Case Management Note  Patient Details  Name: Lee Barnes MRN: [id number] Date of Birth: Feb 28, 1951  Subjective/Objective:     64 y/o m admitted w/hyponatremia.               Action/Plan:From home w/spouse.   Expected Discharge Date:  04/04/15               Expected Discharge Plan:  Home/Self Care  In-House Referral:     Discharge planning Services  CM Consult  Post Acute Care Choice:    Choice offered to:     DME Arranged:    DME Agency:     HH Arranged:    HH Agency:     Status of Service:  In process, will continue to follow  Medicare Important Message Given:    Date Medicare IM Given:    Medicare IM give by:    Date Additional Medicare IM Given:    Additional Medicare Important Message give by:     If discussed at [place] of Stay Meetings, dates discussed:    Additional Comments:  Dessa Phi, RN 04/03/2015, 4:06 PM

## 2015-04-03 NOTE — Progress Notes (Signed)
EEG completed, results pending. 

## 2015-04-03 NOTE — Evaluation (Addendum)
Physical Therapy One Time Evaluation Patient Details Name: Lee Barnes MRN: [id number] DOB: November 02, 1951 Today's Date: 04/03/2015   History of Present Illness  64 y.o. male who is currently admitted for hyponatremia. He underwent laparoscopic inguinal hernia repair several days ago and has hx of radiation therapy (11/05/10-12/26/10); Oropharynx cancer (09/2010); Squamous cell carcinoma; Heart murmur; Pneumonia; History of chemotherapy; and Tinnitus.   Clinical Impression  Patient evaluated by Physical Therapy with no further acute PT needs identified. All education has been completed and the patient has no further questions. Pt mobility at baseline, and he denies any symptoms with activity.  BP checked upon return to room, 173/102 mmHg and RN notified.  See below for any follow-up Physical Therapy or equipment needs. Pt agreeable to ambulate with staff during acute stay.  PT is signing off. Thank you for this referral.     Follow Up Recommendations No PT follow up    Equipment Recommendations  None recommended by PT    Recommendations for Other Services       Precautions / Restrictions Precautions Precautions: None      Mobility  Bed Mobility Overal bed mobility: Modified Independent                Transfers Overall transfer level: Modified independent                  Ambulation/Gait Ambulation/Gait assistance: Supervision;Modified independent (Device/Increase time) Ambulation Distance (Feet): 300 Feet Assistive device: None Gait Pattern/deviations: WFL(Within Functional Limits)     General Gait Details: no dizziness or other symptoms reported  Stairs            Wheelchair Mobility    Modified Rankin (Stroke Patients Only)       Balance Overall balance assessment: No apparent balance deficits (not formally assessed)                                           Pertinent Vitals/Pain Pain Assessment: No/denies pain     Home Living Family/patient expects to be discharged to:: Private residence Living Arrangements: Spouse/significant other   Type of Home: House       Home Layout: One level Home Equipment: None      Prior Function Level of Independence: Independent               Hand Dominance        Extremity/Trunk Assessment   Upper Extremity Assessment: Overall WFL for tasks assessed           Lower Extremity Assessment: Overall WFL for tasks assessed      Cervical / Trunk Assessment: Normal  Communication   Communication: No difficulties  Cognition Arousal/Alertness: Awake/alert Behavior During Therapy: WFL for tasks assessed/performed Overall Cognitive Status: Within Functional Limits for tasks assessed                      General Comments      Exercises        Assessment/Plan    PT Assessment Patent does not need any further PT services  PT Diagnosis     PT Problem List    PT Treatment Interventions     PT Goals (Current goals can be found in the Care Plan section) Acute Rehab PT Goals PT Goal Formulation: All assessment and education complete, DC therapy    Frequency  Barriers to discharge        Co-evaluation               End of Session   Activity Tolerance: Patient tolerated treatment well Patient left: in bed;with call bell/phone within reach Nurse Communication: Mobility status (RN informed of BP)         Time: 8295-6213 PT Time Calculation (min) (ACUTE ONLY): 11 min   Charges:   PT Evaluation $Initial PT Evaluation Tier I: 1 Procedure     PT G Codes:        Joenathan Sakuma,KATHrine E 04/03/2015, 12:21 PM Zenovia Jarred, PT, DPT 04/03/2015 Pager: [phone]

## 2015-04-03 NOTE — Progress Notes (Signed)
PATIENT DETAILS Name: EULA HOEY Age: 64 y.o. Sex: male Date of Birth: [birth year] Admit Date: 04/01/2015 Admitting Physician Therisa Doyne, MD WUJ:[id number],WGNF Leonette Most, MD  Subjective: Doing well-no blurry vision this morning.  Assessment/Plan: Principal Problem:   Hyponatremia: Felt to be multifactorial in etiology-I suspect that ever since patient's recent inguinal hernia repair surgery-patient has had a very poor oral intake and has been mostly drinking liquids. At the same time he was recently started on HCTZ. I suspect hyponatremia to be a combination of excessive fluid intake and HCTZ therapy. 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 Since he appears euvolemic on exam, I will continue with fluid restriction, will give one small dose of Lasix and continue to monitor sodium closely. Although he had a low serum cortisol of 5 on 5/15, serum cortisol this morning was 16.4-further more ACTH stimulation test is not consistent with Addison's disease at this time. TSH within normal limits.  Active Problems:   History of Oropharynx cancer:outpatient follow up with Oncology    Blurry vision, bilateral: Seen by ophthalmology-thought to be an effective better. MRI brain and negative.    Hx of HTN: Only recently started on lisinopril with HCTZ- while being evaluated for inguinal hernia surgery. Prior to that noted history of hypertension. Apparently patient very anxious-suspect could have been whitecoat hypertension. Monitor off antihypertensives for now  Disposition: Remain inpatient-home when Na better  Antimicrobial agents  See below  Anti-infectives    None      DVT Prophylaxis: SCD's  Code Status: Full code   Family Communication Spouse at bedside  Procedures: None  CONSULTS:  None  Time spent 40 minutes-Greater than 50% of this time was spent in counseling, explanation of diagnosis, planning of further management,  and coordination of care.  MEDICATIONS: Scheduled Meds: . loratadine  10 mg Oral Daily  . magnesium oxide  400 mg Oral Daily  . senna  1 tablet Oral BID   Continuous Infusions:  PRN Meds:.acetaminophen **OR** acetaminophen, hydrALAZINE, LORazepam, ondansetron **OR** ondansetron (ZOFRAN) IV, oxyCODONE-acetaminophen, polyethylene glycol    PHYSICAL EXAM: Vital signs in last 24 hours: Filed Vitals:   04/02/15 1905 04/02/15 2236 04/03/15 0428 04/03/15 0809  BP: 125/77 125/72 127/86   Pulse: 91 100 93 90  Temp:  98.5 F (36.9 C) 98.3 F (36.8 C)   TempSrc:  Oral Oral   Resp:  20 20 18   Height:      Weight:      SpO2:  98% 98% 98%    Weight change:  Filed Weights   04/01/15 1925 04/02/15 0010  Weight: 78.019 kg (172 lb) 81.375 kg (179 lb 6.4 oz)   Body mass index is 24.33 kg/(m^2).   Gen Exam: Awake and alert with clear speech.   Neck: Supple, No JVD.   Chest: B/L Clear.   CVS: S1 S2 Regular, no murmurs.  Abdomen: soft, BS +, non tender, non distended. Superficial ecchymoses in ant abd wall-with clearing areas Extremities: no edema, lower extremities warm to touch. Neurologic: Non Focal.   Skin: No Rash.   Wounds: N/A.   Intake/Output from previous day:  Intake/Output Summary (Last 24 hours) at 04/03/15 1114 Last data filed at 04/03/15 0905  Gross per 24 hour  Intake 825.17 ml  Output   3275 ml  Net -2449.83 ml     LAB RESULTS: CBC  Recent Labs Lab 03/31/15 0912 04/01/15 1952 04/02/15  0710  WBC 9.5 8.1 6.2  HGB 10.6* 10.1* 9.2*  HCT 30.4* 28.9* 26.5*  PLT 201 180 163  MCV 83.5 83.0 82.3  MCH 29.1 29.0 28.6  MCHC 34.9 34.9 34.7  RDW 13.3 13.2 12.9  LYMPHSABS 0.4* 0.6*  --   MONOABS 1.2* 1.2*  --   EOSABS 0.0 0.1  --   BASOSABS 0.0 0.0  --     Chemistries   Recent Labs Lab 04/02/15 0710 04/02/15 0855 04/02/15 1518 04/02/15 2049 04/03/15 0249 04/03/15 0525 04/03/15 0900  NA  --  121* 119* 120* 120*  --  121*  K  --  4.7 4.7 4.2 4.7  --   5.0  CL  --  90* 87* 88* 89*  --  88*  CO2  --  24 25 23 22   --  24  GLUCOSE  --  115* 142* 109* 99  --  109*  BUN  --  10 9 10 10   --  13  CREATININE  --  0.77 0.85 0.85 0.81  --  0.87  CALCIUM  --  8.7* 8.5* 8.8* 8.8*  --  9.1  MG 1.7  --   --   --   --  1.9  --     CBG:  Recent Labs Lab 03/30/15 1803  GLUCAP 184*    GFR Estimated Creatinine Clearance: 95.4 mL/min (by C-G formula based on Cr of 0.87).  Coagulation profile No results for input(s): INR, PROTIME in the last 168 hours.  Cardiac Enzymes  Recent Labs Lab 04/01/15 2326 04/02/15 0855 04/02/15 2049  TROPONINI <0.03 <0.03 <0.03    Invalid input(s): POCBNP No results for input(s): DDIMER in the last 72 hours. No results for input(s): HGBA1C in the last 72 hours. No results for input(s): CHOL, HDL, LDLCALC, TRIG, CHOLHDL, LDLDIRECT in the last 72 hours.  Recent Labs  04/02/15 0710  TSH 2.856   No results for input(s): VITAMINB12, FOLATE, FERRITIN, TIBC, IRON, RETICCTPCT in the last 72 hours. No results for input(s): LIPASE, AMYLASE in the last 72 hours.  Urine Studies No results for input(s): UHGB, CRYS in the last 72 hours.  Invalid input(s): UACOL, UAPR, USPG, UPH, UTP, UGL, UKET, UBIL, UNIT, UROB, ULEU, UEPI, UWBC, URBC, UBAC, CAST, UCOM, BILUA  MICROBIOLOGY: Recent Results (from the past 240 hour(s))  Urine culture     Status: None   Collection Time: 04/01/15 11:16 PM  Result Value Ref Range Status   Specimen Description URINE, CLEAN CATCH  Final   Special Requests Normal  Final   Colony Count NO GROWTH Performed at [facility]   Final   Culture NO GROWTH Performed at [facility]   Final   Report Status 04/02/2015 FINAL  Final    RADIOLOGY STUDIES/RESULTS: Dg Chest 2 View  04/01/2015   CLINICAL DATA:  Acute onset of blurred vision. Recent inguinal hernia repair. Initial encounter.  EXAM: CHEST  2 VIEW  COMPARISON:  Chest radiograph performed 03/21/2015  FINDINGS: The  lungs are well-aerated. There is mild elevation of the right hemidiaphragm. There is no evidence of focal opacification, pleural effusion or pneumothorax.  The heart is normal in size; the mediastinal contour is within normal limits. No acute osseous abnormalities are seen.  IMPRESSION: Mild elevation of the right hemidiaphragm; lungs remain grossly clear.   Electronically Signed   By: Roanna Raider M.D.   On: 04/01/2015 23:30   Dg Chest 2 View  03/21/2015   CLINICAL DATA:  Preoperative examination ; history of heart murmur  EXAM: CHEST  2 VIEW  COMPARISON:  Portable chest x-ray dated December 14, 2010  FINDINGS: The lungs are adequately inflated and clear. The heart and pulmonary vascularity are normal. The mediastinum is normal in width. The trachea is midline. There is no pleural effusion. The bony thorax is unremarkable.  IMPRESSION: There is no active cardiopulmonary disease.   Electronically Signed   By: David  [place] M.D.   On: 03/21/2015 13:32   Ct Head Wo Contrast  04/01/2015   CLINICAL DATA:  64 year old male with acute blurred vision today. History of nasopharyngeal cancer.  EXAM: CT HEAD WITHOUT CONTRAST  TECHNIQUE: Contiguous axial images were obtained from the base of the skull through the vertex without intravenous contrast.  COMPARISON:  None.  FINDINGS: No intracranial abnormalities are identified, including mass lesion or mass effect, hydrocephalus, extra-axial fluid collection, midline shift, hemorrhage, or acute infarction.  The visualized bony calvarium is unremarkable.  IMPRESSION: Unremarkable noncontrast head CT.   Electronically Signed   By: Harmon Pier M.D.   On: 04/01/2015 21:15   Mr Brain Wo Contrast  04/02/2015   CLINICAL DATA:  Blurry vision. Hypernatremia and recent inguinal hernia repair. Personal history of oropharyngeal cancer  EXAM: MRI HEAD WITHOUT CONTRAST  TECHNIQUE: Multiplanar, multiecho pulse sequences of the brain and surrounding structures were obtained without  intravenous contrast.  COMPARISON:  04/01/2015 head CT  FINDINGS: There is no evidence of acute infarct, intracranial hemorrhage, mass, midline shift, or extra-axial fluid collection. Mild generalized cerebral atrophy is within normal limits for age. No significant white matter disease is seen for age.  Orbits are unremarkable. Paranasal sinuses and mastoid air cells are clear. Major intracranial vascular flow voids are preserved.  IMPRESSION: Unremarkable appearance of the brain for age.   Electronically Signed   By: Sebastian Ache   On: 04/02/2015 18:58    Jeoffrey Massed, MD  [facility] Pager:[phone]  If 7PM-7AM, please contact night-coverage [email or web address] Password [email or web address] 04/03/2015, 11:14 AM   LOS: 2 days

## 2015-04-03 NOTE — Procedures (Addendum)
ELECTROENCEPHALOGRAM REPORT   Patient: Lee Barnes       Room #: GF8421 EEG No. ID: [id number] Age: 64 y.o.        Sex: male Referring Physician: Ghimire Report Date:  04/03/2015        Interpreting Physician: Alexis Goodell  History: [name] is an 64 y.o. male episodes of blurry vision  Medications:  Scheduled: . loratadine  10 mg Oral Daily  . magnesium oxide  400 mg Oral Daily  . senna  1 tablet Oral BID    Conditions of Recording:  This is a 16 channel EEG carried out with the patient in the awake and drowsy states.  Description:  The waking background activity consists of a low voltage, symmetrical, fairly well organized, 8-9 Hz alpha activity, seen from the parieto-occipital and posterior temporal regions.  Low voltage fast activity, poorly organized, is seen anteriorly and is at times superimposed on more posterior regions.  A mixture of theta and alpha rhythms are seen from the central and temporal regions. The patient drowses with slowing to irregular, low voltage theta and beta activity.   Stage II sleep is not obtained. Hyperventilation was not performed.  Intermittent photic stimulation was performed but failed to illicit any change in the tracing.    IMPRESSION: Normal electroencephalogram, awake, drowsy and with activation procedures. There are no focal lateralizing or epileptiform features.   Alexis Goodell, MD [facility] [phone] 04/03/2015, 3:43 PM

## 2015-04-04 LAB — BASIC METABOLIC PANEL
Anion gap: 8 (ref 5–15)
BUN: 18 mg/dL (ref 6–20)
CO2: 24 mmol/L (ref 22–32)
Calcium: 8.9 mg/dL (ref 8.9–10.3)
Chloride: 92 mmol/L — ABNORMAL LOW (ref 101–111)
Creatinine, Ser: 1.04 mg/dL (ref 0.61–1.24)
GFR calc Af Amer: >60 mL/min (ref >60)
GFR calc non Af Amer: >60 mL/min (ref >60)
Glucose, Bld: 102 mg/dL — ABNORMAL HIGH (ref 65–99)
Potassium: 4.6 mmol/L (ref 3.5–5.1)
Sodium: 124 mmol/L — ABNORMAL LOW (ref 135–145)

## 2015-04-04 LAB — SODIUM, URINE, RANDOM: Sodium, Ur: 66 mEq/L

## 2015-04-04 MED ORDER — FLUTICASONE PROPIONATE 50 MCG/ACT NA SUSP
2.0000 | Freq: Every day | NASAL | Status: DC | PRN
  Administered 2015-04-04: 2 via NASAL
  Filled 2015-04-04: qty 16

## 2015-04-04 NOTE — Progress Notes (Signed)
PATIENT DETAILS Name: Lee Barnes Age: 64 y.o. Sex: male Date of Birth: May 27, 1951 Admit Date: 04/01/2015 Admitting Physician Therisa Doyne, MD ZOX:[id number],UJWJ CHARLES, MD  Subjective: No major complaints-no further bloody vision. Not short of breath. Does not feel dizzy when standing up.  Assessment/Plan: Principal Problem:   Hyponatremia: Felt to be multifactorial in etiology-I suspect that ever since patient's recent inguinal hernia repair surgery-patient has had a very poor oral intake and has been mostly drinking liquids. At the same time he was recently started on HCTZ. I suspect hyponatremia to be a combination of excessive fluid intake and HCTZ therapy. 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 Sodium slowly improving with fluid restriction-Will continue with current measures and monitor for another day or so, if sodium continues to slowly increase, suspect should be able to be discharged home the next day or so.  Although he had a low serum cortisol of 5 on 5/15, serum cortisol this morning was 16.4-further more ACTH stimulation test is not consistent with Addison's disease at this time. TSH within normal limits.  Active Problems:   History of Oropharynx cancer:outpatient follow up with Oncology    Blurry vision, bilateral: Seen by ophthalmology-thought to be an effective better. MRI brain  negative for acute abdomen allergies, EEG negative.    Hx of HTN: Only recently started on lisinopril with HCTZ- while being evaluated for inguinal hernia surgery. Prior to that no history of hypertension. Apparently patient very anxious-suspect could have been whitecoat hypertension. Monitor off antihypertensives for now-blood pressure remained stable  Disposition: Remain inpatient-home when Na better-suspect on 5/18  Antimicrobial agents  See below  Anti-infectives    None      DVT Prophylaxis: SCD's  Code Status: Full code    Family Communication Spouse at bedside  Procedures: None  CONSULTS:  None  Time spent 20 minutes-Greater than 50% of this time was spent in counseling, explanation of diagnosis, planning of further management, and coordination of care.  MEDICATIONS: Scheduled Meds: . loratadine  10 mg Oral Daily  . magnesium oxide  400 mg Oral Daily  . senna  1 tablet Oral BID   Continuous Infusions:  PRN Meds:.acetaminophen **OR** acetaminophen, hydrALAZINE, LORazepam, ondansetron **OR** ondansetron (ZOFRAN) IV, oxyCODONE-acetaminophen, polyethylene glycol    PHYSICAL EXAM: Vital signs in last 24 hours: Filed Vitals:   04/03/15 1152 04/03/15 1425 04/03/15 2105 04/04/15 0425  BP: 116/73 129/69 149/80 100/56  Pulse:  102 92 91  Temp:   98.4 F (36.9 C) 98.3 F (36.8 C)  TempSrc:   Oral Oral  Resp:  18 18 16   Height:      Weight:      SpO2:  100% 96% 96%    Weight change:  Filed Weights   04/01/15 1925 04/02/15 0010  Weight: 78.019 kg (172 lb) 81.375 kg (179 lb 6.4 oz)   Body mass index is 24.33 kg/(m^2).   Gen Exam: Awake and alert with clear speech.   Neck: Supple, No JVD.   Chest: B/L Clear.  No rales or rhonchi CVS: S1 S2 Regular, no murmurs.  Abdomen: soft, BS +, non tender, non distended. Superficial ecchymoses in ant abd wall Extremities: no edema, lower extremities warm to touch. Neurologic: Non Focal.   Skin: No Rash.   Wounds: N/A.   Intake/Output from previous day:  Intake/Output Summary (Last 24 hours) at 04/04/15 1138 Last data filed at 04/04/15 0900  Gross per 24 hour  Intake    680 ml  Output   1750 ml  Net  -1070 ml     LAB RESULTS: CBC  Recent Labs Lab 03/31/15 0912 04/01/15 1952 04/02/15 0710  WBC 9.5 8.1 6.2  HGB 10.6* 10.1* 9.2*  HCT 30.4* 28.9* 26.5*  PLT 201 180 163  MCV 83.5 83.0 82.3  MCH 29.1 29.0 28.6  MCHC 34.9 34.9 34.7  RDW 13.3 13.2 12.9  LYMPHSABS 0.4* 0.6*  --   MONOABS 1.2* 1.2*  --   EOSABS 0.0 0.1  --   BASOSABS  0.0 0.0  --     Chemistries   Recent Labs Lab 04/02/15 0710  04/03/15 0249 04/03/15 0525 04/03/15 0900 04/03/15 1453 04/03/15 2110 04/04/15 0510  NA  --   < > 120*  --  121* 124* 123* 124*  K  --   < > 4.7  --  5.0 4.5 4.7 4.6  CL  --   < > 89*  --  88* 88* 89* 92*  CO2  --   < > 22  --  24 27 25 24   GLUCOSE  --   < > 99  --  109* 95 119* 102*  BUN  --   < > 10  --  13 14 17 18   CREATININE  --   < > 0.81  --  0.87 1.00 1.02 1.04  CALCIUM  --   < > 8.8*  --  9.1 9.2 9.0 8.9  MG 1.7  --   --  1.9  --   --   --   --   < > = values in this interval not displayed.  CBG:  Recent Labs Lab 03/30/15 1803  GLUCAP 184*    GFR Estimated Creatinine Clearance: 79.8 mL/min (by C-G formula based on Cr of 1.04).  Coagulation profile No results for input(s): INR, PROTIME in the last 168 hours.  Cardiac Enzymes  Recent Labs Lab 04/01/15 2326 04/02/15 0855 04/02/15 2049  TROPONINI <0.03 <0.03 <0.03    Invalid input(s): POCBNP No results for input(s): DDIMER in the last 72 hours. No results for input(s): HGBA1C in the last 72 hours. No results for input(s): CHOL, HDL, LDLCALC, TRIG, CHOLHDL, LDLDIRECT in the last 72 hours.  Recent Labs  04/02/15 0710  TSH 2.856   No results for input(s): VITAMINB12, FOLATE, FERRITIN, TIBC, IRON, RETICCTPCT in the last 72 hours. No results for input(s): LIPASE, AMYLASE in the last 72 hours.  Urine Studies No results for input(s): UHGB, CRYS in the last 72 hours.  Invalid input(s): UACOL, UAPR, USPG, UPH, UTP, UGL, UKET, UBIL, UNIT, UROB, ULEU, UEPI, UWBC, URBC, UBAC, CAST, UCOM, BILUA  MICROBIOLOGY: Recent Results (from the past 240 hour(s))  Urine culture     Status: None   Collection Time: 04/01/15 11:16 PM  Result Value Ref Range Status   Specimen Description URINE, CLEAN CATCH  Final   Special Requests Normal  Final   Colony Count NO GROWTH Performed at [facility]   Final   Culture NO GROWTH Performed at [facility]   Final   Report Status 04/02/2015 FINAL  Final    RADIOLOGY STUDIES/RESULTS: Dg Chest 2 View  04/01/2015   CLINICAL DATA:  Acute onset of blurred vision. Recent inguinal hernia repair. Initial encounter.  EXAM: CHEST  2 VIEW  COMPARISON:  Chest radiograph performed 03/21/2015  FINDINGS: The lungs are well-aerated. There is mild elevation of the  right hemidiaphragm. There is no evidence of focal opacification, pleural effusion or pneumothorax.  The heart is normal in size; the mediastinal contour is within normal limits. No acute osseous abnormalities are seen.  IMPRESSION: Mild elevation of the right hemidiaphragm; lungs remain grossly clear.   Electronically Signed   By: Roanna Raider M.D.   On: 04/01/2015 23:30   Dg Chest 2 View  03/21/2015   CLINICAL DATA:  Preoperative examination ; history of heart murmur  EXAM: CHEST  2 VIEW  COMPARISON:  Portable chest x-ray dated December 14, 2010  FINDINGS: The lungs are adequately inflated and clear. The heart and pulmonary vascularity are normal. The mediastinum is normal in width. The trachea is midline. There is no pleural effusion. The bony thorax is unremarkable.  IMPRESSION: There is no active cardiopulmonary disease.   Electronically Signed   By: David  [place] M.D.   On: 03/21/2015 13:32   Ct Head Wo Contrast  04/01/2015   CLINICAL DATA:  64 year old male with acute blurred vision today. History of nasopharyngeal cancer.  EXAM: CT HEAD WITHOUT CONTRAST  TECHNIQUE: Contiguous axial images were obtained from the base of the skull through the vertex without intravenous contrast.  COMPARISON:  None.  FINDINGS: No intracranial abnormalities are identified, including mass lesion or mass effect, hydrocephalus, extra-axial fluid collection, midline shift, hemorrhage, or acute infarction.  The visualized bony calvarium is unremarkable.  IMPRESSION: Unremarkable noncontrast head CT.   Electronically Signed   By: Harmon Pier M.D.   On: 04/01/2015  21:15   Mr Brain Wo Contrast  04/02/2015   CLINICAL DATA:  Blurry vision. Hypernatremia and recent inguinal hernia repair. Personal history of oropharyngeal cancer  EXAM: MRI HEAD WITHOUT CONTRAST  TECHNIQUE: Multiplanar, multiecho pulse sequences of the brain and surrounding structures were obtained without intravenous contrast.  COMPARISON:  04/01/2015 head CT  FINDINGS: There is no evidence of acute infarct, intracranial hemorrhage, mass, midline shift, or extra-axial fluid collection. Mild generalized cerebral atrophy is within normal limits for age. No significant white matter disease is seen for age.  Orbits are unremarkable. Paranasal sinuses and mastoid air cells are clear. Major intracranial vascular flow voids are preserved.  IMPRESSION: Unremarkable appearance of the brain for age.   Electronically Signed   By: Sebastian Ache   On: 04/02/2015 18:58    Jeoffrey Massed, MD  [facility] Pager:[phone]  If 7PM-7AM, please contact night-coverage [email or web address] Password [email or web address] 04/04/2015, 11:38 AM   LOS: 3 days

## 2015-04-04 NOTE — Progress Notes (Signed)
Report received from [facility]. No change in assessment. Continue plan of care. Stacey Drain

## 2015-04-05 LAB — BASIC METABOLIC PANEL
Anion gap: 8 (ref 5–15)
BUN: 23 mg/dL — ABNORMAL HIGH (ref 6–20)
CO2: 24 mmol/L (ref 22–32)
Calcium: 9 mg/dL (ref 8.9–10.3)
Chloride: 95 mmol/L — ABNORMAL LOW (ref 101–111)
Creatinine, Ser: 1.12 mg/dL (ref 0.61–1.24)
GFR calc Af Amer: >60 mL/min (ref >60)
GFR calc non Af Amer: >60 mL/min (ref >60)
Glucose, Bld: 104 mg/dL — ABNORMAL HIGH (ref 65–99)
Potassium: 4.7 mmol/L (ref 3.5–5.1)
Sodium: 127 mmol/L — ABNORMAL LOW (ref 135–145)

## 2015-04-05 NOTE — Discharge Summary (Signed)
PATIENT DETAILS Name: Lee Barnes Age: 64 y.o. Sex: male Date of Birth: 1951/07/10 MRN: [id number]. Admitting Physician: Therisa Doyne, MD TFT:[id number],KYHC CHARLES, MD  Admit Date: 04/01/2015 Discharge date: 04/05/2015  Recommendations for Outpatient Follow-up:  1. Sodium trending up with just fluid restriction-please check bmet at next follow-up  2. Would avoid HCTZ in the future  PRIMARY DISCHARGE DIAGNOSIS:  Principal Problem:   Hyponatremia Active Problems:   Oropharynx cancer   Essential hypertension   Blurry vision, bilateral      PAST MEDICAL HISTORY: Past Medical History  Diagnosis Date  . Diverticulosis 2008  . Allergy to environmental factors   . History of radiation therapy 11/05/10-12/26/10    r base tongue, 7000 cGy 35 sessions  . 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 Heart murmur     hx of in childhood   . Pneumonia     hx of 2012  . History of chemotherapy   . Tinnitus     DISCHARGE MEDICATIONS: Current Discharge Medication List    CONTINUE these medications which have NOT CHANGED   Details  acetaminophen (TYLENOL) 500 MG tablet Take 500 mg by mouth every 6 (six) hours as needed (discomfort).    aspirin 81 MG tablet Take 81 mg by mouth daily.     cetirizine (ZYRTEC) 10 MG tablet Take 10 mg by mouth daily as needed for allergies.     fluticasone (FLONASE) 50 MCG/ACT nasal spray Place 2 sprays into both nostrils daily as needed. Qty: 16 g, Refills: 11   Associated Diagnoses: Allergic rhinitis    ibuprofen (ADVIL,MOTRIN) 200 MG tablet Take 600 mg by mouth every 6 (six) hours as needed (discomfort).      STOP taking these medications     lisinopril-hydrochlorothiazide (PRINZIDE,ZESTORETIC) 10-12.5 MG per tablet      oxyCODONE-acetaminophen (ROXICET) 5-325 MG per tablet         ALLERGIES:   Allergies  Allergen Reactions  . Codeine Nausea And Vomiting    BRIEF HPI:  See H&P,  Labs, Consult and Test reports for all details in brief, patient was admitted for blurry vision, further evaluation revealed hyponatremia.  CONSULTATIONS:   Opthalmology  PERTINENT RADIOLOGIC STUDIES: Dg Chest 2 View  04/01/2015   CLINICAL DATA:  Acute onset of blurred vision. Recent inguinal hernia repair. Initial encounter.  EXAM: CHEST  2 VIEW  COMPARISON:  Chest radiograph performed 03/21/2015  FINDINGS: The lungs are well-aerated. There is mild elevation of the right hemidiaphragm. There is no evidence of focal opacification, pleural effusion or pneumothorax.  The heart is normal in size; the mediastinal contour is within normal limits. No acute osseous abnormalities are seen.  IMPRESSION: Mild elevation of the right hemidiaphragm; lungs remain grossly clear.   Electronically Signed   By: Roanna Raider M.D.   On: 04/01/2015 23:30   Dg Chest 2 View  03/21/2015   CLINICAL DATA:  Preoperative examination ; history of heart murmur  EXAM: CHEST  2 VIEW  COMPARISON:  Portable chest x-ray dated December 14, 2010  FINDINGS: The lungs are adequately inflated and clear. The heart and pulmonary vascularity are normal. The mediastinum is normal in width. The trachea is midline. There is no pleural effusion. The bony thorax is unremarkable.  IMPRESSION: There is no active cardiopulmonary disease.   Electronically Signed   By: David  [place] M.D.   On: 03/21/2015 13:32   Ct Head Wo Contrast  04/01/2015   CLINICAL DATA:  64 year old male with acute blurred vision today. History of nasopharyngeal cancer.  EXAM: CT HEAD WITHOUT CONTRAST  TECHNIQUE: Contiguous axial images were obtained from the base of the skull through the vertex without intravenous contrast.  COMPARISON:  None.  FINDINGS: No intracranial abnormalities are identified, including mass lesion or mass effect, hydrocephalus, extra-axial fluid collection, midline shift, hemorrhage, or acute infarction.  The visualized bony calvarium is unremarkable.   IMPRESSION: Unremarkable noncontrast head CT.   Electronically Signed   By: Harmon Pier M.D.   On: 04/01/2015 21:15   Mr Brain Wo Contrast  04/02/2015   CLINICAL DATA:  Blurry vision. Hypernatremia and recent inguinal hernia repair. Personal history of oropharyngeal cancer  EXAM: MRI HEAD WITHOUT CONTRAST  TECHNIQUE: Multiplanar, multiecho pulse sequences of the brain and surrounding structures were obtained without intravenous contrast.  COMPARISON:  04/01/2015 head CT  FINDINGS: There is no evidence of acute infarct, intracranial hemorrhage, mass, midline shift, or extra-axial fluid collection. Mild generalized cerebral atrophy is within normal limits for age. No significant white matter disease is seen for age.  Orbits are unremarkable. Paranasal sinuses and mastoid air cells are clear. Major intracranial vascular flow voids are preserved.  IMPRESSION: Unremarkable appearance of the brain for age.   Electronically Signed   By: Sebastian Ache   On: 04/02/2015 18:58     PERTINENT LAB RESULTS: CBC: No results for input(s): WBC, HGB, HCT, PLT in the last 72 hours. CMET CMP     Component Value Date/Time   NA 127* 04/05/2015 0440   NA 138 03/24/2014 0853   NA 139 03/18/2012 0836   K 4.7 04/05/2015 0440   K 5.1 03/24/2014 0853   K 4.6 03/18/2012 0836   CL 95* 04/05/2015 0440   CL 99 03/23/2013 1004   CL 97* 03/18/2012 0836   CO2 24 04/05/2015 0440   CO2 24 03/24/2014 0853   CO2 28 03/18/2012 0836   GLUCOSE 104* 04/05/2015 0440   GLUCOSE 100 03/24/2014 0853   GLUCOSE 94 03/23/2013 1004   GLUCOSE 106 03/18/2012 0836   BUN 23* 04/05/2015 0440   BUN 11.5 03/24/2014 0853   BUN 12 03/18/2012 0836   CREATININE 1.12 04/05/2015 0440   CREATININE 1.04 03/21/2015 0001   CREATININE 1.1 03/24/2014 0853   CALCIUM 9.0 04/05/2015 0440   CALCIUM 9.9 03/24/2014 0853   CALCIUM 9.0 03/18/2012 0836   PROT 7.2 04/02/2015 0139   PROT 7.4 03/24/2014 0853   PROT 7.2 03/18/2012 0836   ALBUMIN 3.3*  04/02/2015 0139   ALBUMIN 3.9 03/24/2014 0853   AST 17 04/02/2015 0139   AST 18 03/24/2014 0853   AST 24 03/18/2012 0836   ALT 11* 04/02/2015 0139   ALT 15 03/24/2014 0853   ALT 18 03/18/2012 0836   ALKPHOS 64 04/02/2015 0139   ALKPHOS 106 03/24/2014 0853   ALKPHOS 99* 03/18/2012 0836   BILITOT 0.4 04/02/2015 0139   BILITOT 0.33 03/24/2014 0853   BILITOT 0.80 03/18/2012 0836   GFRNONAA >60 04/05/2015 0440   GFRAA >60 04/05/2015 0440    GFR Estimated Creatinine Clearance: 74.1 mL/min (by C-G formula based on Cr of 1.12). No results for input(s): LIPASE, AMYLASE in the last 72 hours.  Recent Labs  04/02/15 2049  TROPONINI <0.03   Invalid input(s): POCBNP No results for input(s): DDIMER in the last 72 hours. No results for input(s): HGBA1C in the last 72 hours. No results for input(s): CHOL, HDL, LDLCALC, TRIG, CHOLHDL,  LDLDIRECT in the last 72 hours. No results for input(s): TSH, T4TOTAL, T3FREE, THYROIDAB in the last 72 hours.  Invalid input(s): FREET3 No results for input(s): VITAMINB12, FOLATE, FERRITIN, TIBC, IRON, RETICCTPCT in the last 72 hours. Coags: No results for input(s): INR in the last 72 hours.  Invalid input(s): PT Microbiology: Recent Results (from the past 240 hour(s))  Urine culture     Status: None   Collection Time: 04/01/15 11:16 PM  Result Value Ref Range Status   Specimen Description URINE, CLEAN CATCH  Final   Special Requests Normal  Final   Colony Count NO GROWTH Performed at [facility]   Final   Culture NO GROWTH Performed at [facility]   Final   Report Status 04/02/2015 FINAL  Final     BRIEF HOSPITAL COURSE:   Hyponatremia: Felt to be multifactorial in etiology-suspect that ever since patient's recent inguinal hernia repair surgery-patient has had a very poor oral intake and has been mostly drinking liquids. At the same time he was recently started on HCTZ. I suspect hyponatremia to be a combination of excessive  free water intake and HCTZ therapy. 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 In any event, patient was admitted and initially given IV fluids-sodium did not increase. Subsequently upon further workup-he was placed on fluid restriction. Sodium slowly started to increase, by day of discharge had increased 127. Please note during this time patient was completely asymptomatic. Patient has been instructed to continue with fluid restriction, I have arranged a follow-up appointment at his PCPs office on 5/20 where he needs a repeat be made to make sure sodium still trending up. Note TSH was within normal limits, ACTH stimulation test was not consistent with Addison's disease. Would avoid HCTZ in the future.  Active Problems:  History of Oropharynx cancer:outpatient follow up with Oncology   Blurry vision, bilateral: Seen by ophthalmology-thought to be an effective better. MRI brain negative for acute abdomen allergies, EEG negative.   Hx of HTN: Only recently started on lisinopril with HCTZ- while being evaluated for inguinal hernia surgery. Prior to that no history of hypertension. Apparently patient very anxious-suspect could have been whitecoat hypertension. Monitor off antihypertensives for now-blood pressure remained stable   TODAY-DAY OF DISCHARGE:  Subjective:   Lee Barnes today has no headache,no chest abdominal pain,no new weakness tingling or numbness, feels much better wants to go home today.   Objective:   Blood pressure 109/60, pulse 96, temperature 98.7 F (37.1 C), temperature source Oral, resp. rate 16, height 6' (1.829 m), weight 81.375 kg (179 lb 6.4 oz), SpO2 97 %.  Intake/Output Summary (Last 24 hours) at 04/05/15 0942 Last data filed at 04/05/15 0450  Gross per 24 hour  Intake      0 ml  Output   1300 ml  Net  -1300 ml   Filed Weights   04/01/15 1925 04/02/15 0010  Weight: 78.019 kg (172 lb) 81.375 kg (179 lb 6.4 oz)    Exam Awake Alert, Oriented *3,  No new F.N deficits, Normal affect [place].AT,PERRAL Supple Neck,No JVD, No cervical lymphadenopathy appriciated.  Symmetrical Chest wall movement, Good air movement bilaterally, CTAB RRR,No Gallops,Rubs or new Murmurs, No Parasternal Heave +ve B.Sounds, Abd Soft, Non tender, No organomegaly appriciated, No rebound -guarding or rigidity. No Cyanosis, Clubbing or edema, No new Rash or bruise  DISCHARGE CONDITION: Stable  DISPOSITION: Home  DISCHARGE INSTRUCTIONS:    Activity:  As tolerated   Diet recommendation: Regular  Diet Fluid restriction 1.2 lit/day   Discharge Instructions    Call MD for:  extreme fatigue    Complete by:  As directed      Call MD for:  persistant dizziness or light-headedness    Complete by:  As directed      Diet - low sodium heart healthy    Complete by:  As directed   1200 cc fluid restriction     Increase activity slowly    Complete by:  As directed            Follow-up Information    Follow up with Carollee Herter, MD On 04/07/2015.   Specialty:  Family Medicine   Why:  your appointment with PA Tysinger at 10:45 am. Please make sure you get your electrolytes checked   Contact information:   [address] [place] [address] [phone]       Total Time spent on discharge equals 25  minutes.  SignedJeoffrey Massed 04/05/2015 9:42 AM

## 2015-04-05 NOTE — Progress Notes (Signed)
Went over all discharge information with pt and daughter.  All questions answered.  Explained importance of taking medications as prescribed and to follow up with PCP.  VSS.  Pt will be wheeled out by NT when dressed.

## 2015-04-05 NOTE — Care Management Note (Signed)
Case Management Note  Patient Details  Name: Lee Barnes MRN: [id number] Date of Birth: Sep 03, 1951  Subjective/Objective: 64 yo M admitted with Hyponatremia, resolved.                   Action/Plan:Discharged to home with no needs   Expected Discharge Date:                Expected Discharge Plan:  Home/Self Care  In-House Referral:     Discharge planning Services  CM Consult  Post Acute Care Choice:    Choice offered to:     DME Arranged:    DME Agency:     HH Arranged:    [place] Agency:     Status of Service:  Completed, signed off  Medicare Important Message Given:    Date Medicare IM Given:    Medicare IM give by:    Date Additional Medicare IM Given:    Additional Medicare Important Message give by:     If discussed at [place] of Stay Meetings, dates discussed:    Additional Comments:  Dessa Phi, RN 04/05/2015, 10:46 AM

## 2015-04-07 ENCOUNTER — Ambulatory Visit (INDEPENDENT_AMBULATORY_CARE_PROVIDER_SITE_OTHER): Payer: BC Managed Care – PPO | Admitting: Medical

## 2015-04-07 ENCOUNTER — Encounter: Payer: Self-pay | Admitting: Medical

## 2015-04-07 VITALS — BP 120/70 | HR 96 | Temp 98.2°F | Resp 14 | Wt 172.0 lb

## 2015-04-07 DIAGNOSIS — J309 Allergic rhinitis, unspecified: Secondary | ICD-10-CM

## 2015-04-07 DIAGNOSIS — E871 Hypo-osmolality and hyponatremia: Secondary | ICD-10-CM | POA: Diagnosis not present

## 2015-04-07 DIAGNOSIS — T148XXA Other injury of unspecified body region, initial encounter: Secondary | ICD-10-CM

## 2015-04-07 DIAGNOSIS — Z9889 Other specified postprocedural states: Secondary | ICD-10-CM

## 2015-04-07 DIAGNOSIS — T148 Other injury of unspecified body region: Secondary | ICD-10-CM

## 2015-04-07 DIAGNOSIS — Z8719 Personal history of other diseases of the digestive system: Secondary | ICD-10-CM

## 2015-04-07 LAB — BASIC METABOLIC PANEL
BUN: 22 mg/dL (ref 6–23)
CO2: 28 mEq/L (ref 19–32)
Calcium: 9.8 mg/dL (ref 8.4–10.5)
Chloride: 93 mEq/L — ABNORMAL LOW (ref 96–112)
Creat: 1 mg/dL (ref 0.50–1.35)
Glucose, Bld: 102 mg/dL — ABNORMAL HIGH (ref 70–99)
Potassium: 4.8 mEq/L (ref 3.5–5.3)
Sodium: 133 mEq/L — ABNORMAL LOW (ref 135–145)

## 2015-04-07 NOTE — Progress Notes (Signed)
Subjective Here for hospital follow up.   Here today with his wife.   Admit date: 04/01/15 Discharge date: 04/05/15  He was admitted for blurred vision, HTN, and hyponatremia.   Found to have hyponatremia, thought to be related to combination of dehydration, poor po intake s/p recent surgery and addition of recent diuretic for elevated BP.  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 He had bilat inguinal hernia surgery recently and apparently a BP medication was added since BP was elevated although no hx/o hypertension.  Since the recent surgery he has had lots of bruising and swelling of pubic region, but although swelling has improved left inguinal region quite swollen and purplish coloration.    New medications started per hospitalization include - none, but HCTZ stopped and advised no future diuretics.     In general he reports hx/o dry mouth since hx/o radiation therapy for prior oropharynx cancer.  He does take antihistamine daily for allergies as well. Since recent hospitalization, he feels somewhat fatigued, and has been trying to take it easy.  He is restricting water to 40oz daily.   He east a variety of foods, uses some diet discretion in general.  No new problems.    Past Medical History  Diagnosis Date  . Diverticulosis 2008  . Allergy to environmental factors   . History of radiation therapy 11/05/10-12/26/10    r base tongue, 7000 cGy 35 sessions  . 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 Heart murmur     hx of in childhood   . Pneumonia     hx of 2012  . History of chemotherapy   . Tinnitus      ROS as in subjective  Objective: BP 120/70 mmHg  Pulse 96  Temp(Src) 98.2 F (36.8 C) (Oral)  Resp 14  Wt 172 lb (78.019 kg)   General appearance: alert, no distress, WD/WN, lean white male HEENT: normocephalic, sclerae anicteric, PERRLA, EOMi, nares patent, no discharge or erythema, pharynx normal Oral  cavity: MMM, no lesions Neck: supple, no lymphadenopathy, no thyromegaly, no masses Heart: RRR, normal S1, S2, no murmurs Lungs: CTA bilaterally, no wheezes, rhonchi, or rales Abdomen: +bs, soft, non tender, non distended, no masses, no hepatomegaly, no splenomegaly Skin: diffuse areas of red/purple ecchymosis of right upper thigh, right lower abdomen and flank, groin across left and right, swelling of left inguinal region suggestive of hematoma, no fluctuance or warmth    Assessment: Encounter Diagnoses  Name Primary?  . Hyponatremia Yes  . Bruising   . S/P hernia repair   . Allergic rhinitis, unspecified allergic rhinitis type      Plan: STAT Bmet lab today.  discussed his recent discharge summary, hospitalization, surgery, and concerns.  Advised he avoid high sugar and unhealthy foods in general.  We will call with additional recommendations pending the lab.   Hernia, bruisning - called and spoke to Dr. Rush Farmer, and his findings are expected and he will f/u with surgeon next week as planned for surgery f/u.   Allergic rhinitis - consider just using flonase and not oral antihistamine given dry mouth.  F/u pending labs.

## 2015-04-25 ENCOUNTER — Ambulatory Visit (INDEPENDENT_AMBULATORY_CARE_PROVIDER_SITE_OTHER): Payer: BC Managed Care – PPO | Admitting: Family Medicine

## 2015-04-25 ENCOUNTER — Encounter: Payer: Self-pay | Admitting: Family Medicine

## 2015-04-25 VITALS — BP 170/110 | HR 84 | Ht 72.0 in | Wt 171.0 lb

## 2015-04-25 DIAGNOSIS — E871 Hypo-osmolality and hyponatremia: Secondary | ICD-10-CM

## 2015-04-25 DIAGNOSIS — Z9889 Other specified postprocedural states: Secondary | ICD-10-CM

## 2015-04-25 DIAGNOSIS — I10 Essential (primary) hypertension: Secondary | ICD-10-CM

## 2015-04-25 DIAGNOSIS — Z8719 Personal history of other diseases of the digestive system: Secondary | ICD-10-CM

## 2015-04-25 LAB — COMPREHENSIVE METABOLIC PANEL
ALT: 12 U/L (ref 0–53)
AST: 19 U/L (ref 0–37)
Albumin: 4 g/dL (ref 3.5–5.2)
Alkaline Phosphatase: 113 U/L (ref 39–117)
BUN: 12 mg/dL (ref 6–23)
CO2: 25 mEq/L (ref 19–32)
Calcium: 9.7 mg/dL (ref 8.4–10.5)
Chloride: 101 mEq/L (ref 96–112)
Creat: 0.96 mg/dL (ref 0.50–1.35)
Glucose, Bld: 84 mg/dL (ref 70–99)
Potassium: 4.6 mEq/L (ref 3.5–5.3)
Sodium: 134 mEq/L — ABNORMAL LOW (ref 135–145)
Total Bilirubin: 0.4 mg/dL (ref 0.2–1.2)
Total Protein: 7.1 g/dL (ref 6.0–8.3)

## 2015-04-25 LAB — CBC WITH DIFFERENTIAL/PLATELET
Basophils Absolute: 0 10*3/uL (ref 0.0–0.1)
Basophils Relative: 0 % (ref 0–1)
Eosinophils Absolute: 0.2 10*3/uL (ref 0.0–0.7)
Eosinophils Relative: 4 % (ref 0–5)
HCT: 32.6 % — ABNORMAL LOW (ref 39.0–52.0)
Hemoglobin: 10.6 g/dL — ABNORMAL LOW (ref 13.0–17.0)
Lymphocytes Relative: 14 % (ref 12–46)
Lymphs Abs: 0.6 10*3/uL — ABNORMAL LOW (ref 0.7–4.0)
MCH: 28 pg (ref 26.0–34.0)
MCHC: 32.5 g/dL (ref 30.0–36.0)
MCV: 86 fL (ref 78.0–100.0)
MPV: 8.8 fL (ref 8.6–12.4)
Monocytes Absolute: 0.7 10*3/uL (ref 0.1–1.0)
Monocytes Relative: 16 % — ABNORMAL HIGH (ref 3–12)
Neutro Abs: 2.7 10*3/uL (ref 1.7–7.7)
Neutrophils Relative %: 66 % (ref 43–77)
Platelets: 344 10*3/uL (ref 150–400)
RBC: 3.79 MIL/uL — ABNORMAL LOW (ref 4.22–5.81)
RDW: 15.2 % (ref 11.5–15.5)
WBC: 4.1 10*3/uL (ref 4.0–10.5)

## 2015-04-25 MED ORDER — LISINOPRIL-HYDROCHLOROTHIAZIDE 10-12.5 MG PO TABS: 1.0000 | ORAL_TABLET | Freq: Every day | ORAL | Status: DC

## 2015-04-25 NOTE — Progress Notes (Signed)
Subjective:    Patient ID: AIDEAN FAWCETT, male    DOB: [birth year], 64 y.o.   MRN: [id number]  HPI He is here for a follow-up visit after recent hospitalization. He did have hernia surgery and subsequently had difficulty and needed to be readmitted. He did have evidence of hyponatremia. They did stop his diaphoretic. He still does feel somewhat weak compared to pre-hospital visit. He has no other symptoms. He recently saw his general surgeon and he still has limitations on physical activity.   Review of Systems     Objective:   Physical Exam Alert and in no distress. The hospital discharge summary was reviewed. Today's blood pressure is recorded.       Assessment & Plan:  Hyponatremia - Plan: Comprehensive metabolic panel, CBC with Differential/Platelet  S/P hernia repair - Plan: Comprehensive metabolic panel, CBC with Differential/Platelet  Essential hypertension - Plan: lisinopril-hydrochlorothiazide (PRINZIDE,ZESTORETIC) 10-12.5 MG per tablet I discussed the problem from the hospitalization in the low sodium. I think it is multifactorial reviewed he does definitely need an anti-hypertensive medication. I discussed options with him and I think is safe to place him back on lisinopril/HCTZ and monitor him closely. He is to start this medication after we review his blood work from today. Check here in one month.

## 2015-05-03 ENCOUNTER — Ambulatory Visit (INDEPENDENT_AMBULATORY_CARE_PROVIDER_SITE_OTHER): Payer: BC Managed Care – PPO | Admitting: Family Medicine

## 2015-05-03 ENCOUNTER — Encounter: Payer: Self-pay | Admitting: Family Medicine

## 2015-05-03 VITALS — BP 180/100 | HR 93 | Wt 173.0 lb

## 2015-05-03 DIAGNOSIS — I10 Essential (primary) hypertension: Secondary | ICD-10-CM

## 2015-05-03 DIAGNOSIS — L259 Unspecified contact dermatitis, unspecified cause: Secondary | ICD-10-CM | POA: Diagnosis not present

## 2015-05-03 NOTE — Patient Instructions (Signed)
Get a blood pressure machine to bring it in so we can measure it against ours. Cold compresses and cortisone for the elbows

## 2015-05-03 NOTE — Progress Notes (Signed)
Subjective:    Patient ID: Lee Barnes, male    DOB: 11/16/1951, 64 y.o.   MRN: [id number]  HPI He is here for consult concerning an episode of feeling bad. During that timeframe he was bending over doing some work and stood up and became quite dizzy. He then stopped his blood pressure medications. He did note blood pressure during that same timeframe and the 90/70 range. Presently he is doing fine having no symptoms Other than ear fullness and a rash on his elbows.   Review of Systems     Objective:   Physical Exam Alert and in no distress. Tympanic membranes and canals are normal. Pharyngeal area is normal. Neck is supple without adenopathy or thyromegaly. Cardiac exam shows a regular sinus rhythm without murmurs or gallops. Lungs are clear to auscultation., Both elbows does show slight patchy erythema.        Assessment & Plan:  Essential hypertension  Contact dermatitis I explained that one of his symptoms seem like postural hypotension when he went from a squatting position to standing. Reassured him that that was not unusual however the blood pressure of 90/70 is of concern. Recommend he go back on his blood pressure medication and keep track of his blood pressure. He will get a cuff and bring it in here for [place] to validate accuracy. Also recommend cold compresses and cortisone for the elbows as he probably did get contact with some chemical irritant.

## 2015-05-16 ENCOUNTER — Other Ambulatory Visit: Payer: Self-pay | Admitting: *Deleted

## 2015-05-16 ENCOUNTER — Other Ambulatory Visit: Payer: Self-pay | Admitting: Surgery

## 2015-05-16 DIAGNOSIS — T148XXA Other injury of unspecified body region, initial encounter: Secondary | ICD-10-CM

## 2015-05-23 ENCOUNTER — Ambulatory Visit
Admission: RE | Admit: 2015-05-23 | Discharge: 2015-05-23 | Disposition: A | Discharge status: Home / Self Care | Payer: BC Managed Care – PPO | Source: Ambulatory Visit | Attending: Surgery | Admitting: Surgery

## 2015-05-23 MED ORDER — IOPAMIDOL (ISOVUE-300) INJECTION 61%
100.0000 mL | Freq: Once | INTRAVENOUS | Status: AC | PRN
  Administered 2015-05-23: 100 mL via INTRAVENOUS

## 2015-05-25 ENCOUNTER — Ambulatory Visit (INDEPENDENT_AMBULATORY_CARE_PROVIDER_SITE_OTHER): Payer: BC Managed Care – PPO | Admitting: Family Medicine

## 2015-05-25 ENCOUNTER — Encounter: Payer: Self-pay | Admitting: Family Medicine

## 2015-05-25 VITALS — BP 160/100 | HR 83 | Wt 173.0 lb

## 2015-05-25 DIAGNOSIS — Z9889 Other specified postprocedural states: Secondary | ICD-10-CM | POA: Diagnosis not present

## 2015-05-25 DIAGNOSIS — T464X5A Adverse effect of angiotensin-converting-enzyme inhibitors, initial encounter: Secondary | ICD-10-CM

## 2015-05-25 DIAGNOSIS — R058 Other specified cough: Secondary | ICD-10-CM | POA: Insufficient documentation

## 2015-05-25 DIAGNOSIS — I10 Essential (primary) hypertension: Secondary | ICD-10-CM | POA: Diagnosis not present

## 2015-05-25 DIAGNOSIS — R05 Cough: Secondary | ICD-10-CM

## 2015-05-25 DIAGNOSIS — Z8719 Personal history of other diseases of the digestive system: Secondary | ICD-10-CM

## 2015-05-25 MED ORDER — LOSARTAN POTASSIUM-HCTZ 50-12.5 MG PO TABS
1.0000 | ORAL_TABLET | Freq: Every day | ORAL | Status: DC
Start: 2015-05-25 — End: 2016-05-24

## 2015-05-25 MED ORDER — LISINOPRIL-HYDROCHLOROTHIAZIDE 20-12.5 MG PO TABS
1.0000 | ORAL_TABLET | Freq: Every day | ORAL | Status: DC
Start: 2015-05-25 — End: 2015-05-25

## 2015-05-25 MED ORDER — LOSARTAN POTASSIUM-HCTZ 50-12.5 MG PO TABS: 1.0000 | ORAL_TABLET | Freq: Every day | ORAL | Status: DC

## 2015-05-25 NOTE — Progress Notes (Signed)
Subjective:    Patient ID: Lee Barnes, male    DOB: June 01, 1951, 64 y.o.   MRN: [id number]  HPI He is here for recheck. He did bring blood pressure readings in. Discussion with him indicates that some of these were taken at different times during the day and not necessarily resting. Also notes difficulty with cough during the day and worse at night. He has had difficulty with the left inguinal hernia repair and apparently is going to have a revision done. There is concerned over whether to admit him overnight for this.   Review of Systems     Objective:   Physical Exam Alert and in no distress. Blood pressures were reviewed.       Assessment & Plan:  Essential hypertension - Plan: DISCONTINUED: lisinopril-hydrochlorothiazide (ZESTORETIC) 20-12.5 MG per tablet, DISCONTINUED: losartan-hydrochlorothiazide (HYZAAR) 50-12.5 MG per tablet  ACE-inhibitor cough  S/P hernia repair I'm treating him as if he has an ACE cough and will switch.Instructed him on how to take the blood pressure. His machine is accurate and I will therefore have him call me in one month to let me know his blood pressures are doing. We also discussed the revision of the hernia. I informed him that I had no trouble with him spending a night or 2 in the hospital especially with his previous postop complications.

## 2015-05-25 NOTE — Patient Instructions (Signed)
Call me in a month and let me know if blood pressures are running and make sure you check it in the sitting position After5 minutes

## 2015-05-29 ENCOUNTER — Other Ambulatory Visit: Payer: Self-pay | Admitting: Surgery

## 2015-07-03 ENCOUNTER — Encounter (HOSPITAL_COMMUNITY)
Admission: RE | Admit: 2015-07-03 | Discharge: 2015-07-03 | Disposition: A | Discharge status: Home / Self Care | Payer: BC Managed Care – PPO | Source: Ambulatory Visit | Attending: Surgery | Admitting: Surgery

## 2015-07-03 ENCOUNTER — Encounter (HOSPITAL_COMMUNITY): Payer: Self-pay

## 2015-07-03 DIAGNOSIS — Z01812 Encounter for preprocedural laboratory examination: Secondary | ICD-10-CM | POA: Insufficient documentation

## 2015-07-03 DIAGNOSIS — K4090 Unilateral inguinal hernia, without obstruction or gangrene, not specified as recurrent: Secondary | ICD-10-CM | POA: Diagnosis not present

## 2015-07-03 HISTORY — DX: Adverse effect of unspecified anesthetic, initial encounter: T41.45XA

## 2015-07-03 HISTORY — DX: Other complications of anesthesia, initial encounter: T88.59XA

## 2015-07-03 LAB — CBC
HCT: 36.4 % — ABNORMAL LOW (ref 39.0–52.0)
Hemoglobin: 12.4 g/dL — ABNORMAL LOW (ref 13.0–17.0)
MCH: 28.1 pg (ref 26.0–34.0)
MCHC: 34.1 g/dL (ref 30.0–36.0)
MCV: 82.5 fL (ref 78.0–100.0)
Platelets: 215 10*3/uL (ref 150–400)
RBC: 4.41 MIL/uL (ref 4.22–5.81)
RDW: 14.2 % (ref 11.5–15.5)
WBC: 4.5 10*3/uL (ref 4.0–10.5)

## 2015-07-03 LAB — BASIC METABOLIC PANEL
Anion gap: 10 (ref 5–15)
BUN: 16 mg/dL (ref 6–20)
CO2: 24 mmol/L (ref 22–32)
Calcium: 9.6 mg/dL (ref 8.9–10.3)
Chloride: 93 mmol/L — ABNORMAL LOW (ref 101–111)
Creatinine, Ser: 1.16 mg/dL (ref 0.61–1.24)
GFR calc Af Amer: >60 mL/min (ref >60)
GFR calc non Af Amer: >60 mL/min (ref >60)
Glucose, Bld: 104 mg/dL — ABNORMAL HIGH (ref 65–99)
Potassium: 4.7 mmol/L (ref 3.5–5.1)
Sodium: 127 mmol/L — ABNORMAL LOW (ref 135–145)

## 2015-07-03 NOTE — Pre-Procedure Instructions (Addendum)
Lee Barnes  07/03/2015      [facility] - [place], [place] - [address] [place] [address] Phone: [phone] Fax: [phone]    Your procedure is scheduled on 07/11/15.  Report to [facility] short stay admitting at 1100 A.M.  Call this number if you have problems the morning of surgery:  [phone]   Remember:  Do not eat food or drink liquids after midnight.  Take these medicines the morning of surgery with A SIP OF WATER none       STOP all herbel meds, nsaids (aleve,naproxen,advil,ibuprofen) 5 days prior to surgery starting 07/06/15 including vitamins,aspirin   Do not wear jewelry, make-up or nail polish.  Do not wear lotions, powders, or perfumes.  You may wear deodorant.  Do not shave 48 hours prior to surgery.  Men may shave face and neck.  Do not bring valuables to the hospital.  [facility] is not responsible for any belongings or valuables.  Contacts, dentures or bridgework may not be worn into surgery.  Leave your suitcase in the car.  After surgery it may be brought to your room.  For patients admitted to the hospital, discharge time will be determined by your treatment team.  Patients discharged the day of surgery will not be allowed to drive home.   Name and phone number of your driver:    Special instructions:   Special Instructions: [facility] - Preparing for Surgery  Before surgery, you can play an important role.  Because skin is not sterile, your skin needs to be as free of germs as possible.  You can reduce the number of germs on you skin by washing with CHG (chlorahexidine gluconate) soap before surgery.  CHG is an antiseptic cleaner which kills germs and bonds with the skin to continue killing germs even after washing.  Please DO NOT use if you have an allergy to CHG or antibacterial soaps.  If your skin becomes reddened/irritated stop using the CHG and inform your nurse when you arrive at  Short Stay.  Do not shave (including legs and underarms) for at least 48 hours prior to the first CHG shower.  You may shave your face.  Please follow these instructions carefully:   1.  Shower with CHG Soap the night before surgery and the morning of Surgery.  2.  If you choose to wash your hair, wash your hair first as usual with your normal shampoo.  3.  After you shampoo, rinse your hair and body thoroughly to remove the Shampoo.  4.  Use CHG as you would any other liquid soap.  You can apply chg directly  to the skin and wash gently with scrungie or a clean washcloth.  5.  Apply the CHG Soap to your body ONLY FROM THE NECK DOWN.  Do not use on open wounds or open sores.  Avoid contact with your eyes ears, mouth and genitals (private parts).  Wash genitals (private parts)       with your normal soap.  6.  Wash thoroughly, paying special attention to the area where your surgery will be performed.  7.  Thoroughly rinse your body with warm water from the neck down.  8.  DO NOT shower/wash with your normal soap after using and rinsing off the CHG Soap.  9.  Pat yourself dry with a clean towel.            10.  Wear  clean pajamas.            11.  Place clean sheets on your bed the night of your first shower and do not sleep with pets.  Day of Surgery  Do not apply any lotions/deodorants the morning of surgery.  Please wear clean clothes to the hospital/surgery center.  Please read over the following fact sheets that you were given. Pain Booklet, Coughing and Deep Breathing and Surgical Site Infection Prevention

## 2015-07-10 MED ORDER — CEFAZOLIN SODIUM-DEXTROSE 2-3 GM-% IV SOLR
2.0000 g | INTRAVENOUS | Status: AC
  Administered 2015-07-11: 2 g via INTRAVENOUS
  Filled 2015-07-10: qty 50

## 2015-07-11 ENCOUNTER — Ambulatory Visit (HOSPITAL_COMMUNITY): Payer: BC Managed Care – PPO | Admitting: Anesthesiology

## 2015-07-11 ENCOUNTER — Encounter (HOSPITAL_COMMUNITY)
Admission: RE | Disposition: A | Discharge status: Home / Self Care | Payer: Self-pay | Source: Ambulatory Visit | Attending: Surgery

## 2015-07-11 ENCOUNTER — Observation Stay (HOSPITAL_COMMUNITY)
Admission: RE | Admit: 2015-07-11 | Discharge: 2015-07-12 | Disposition: A | Discharge status: Home / Self Care | Payer: BC Managed Care – PPO | Source: Ambulatory Visit | Attending: Surgery | Admitting: Surgery

## 2015-07-11 ENCOUNTER — Encounter (HOSPITAL_COMMUNITY): Payer: Self-pay | Admitting: *Deleted

## 2015-07-11 DIAGNOSIS — Z8249 Family history of ischemic heart disease and other diseases of the circulatory system: Secondary | ICD-10-CM | POA: Diagnosis not present

## 2015-07-11 DIAGNOSIS — Z923 Personal history of irradiation: Secondary | ICD-10-CM | POA: Diagnosis not present

## 2015-07-11 DIAGNOSIS — K4091 Unilateral inguinal hernia, without obstruction or gangrene, recurrent: Principal | ICD-10-CM | POA: Insufficient documentation

## 2015-07-11 DIAGNOSIS — Z85810 Personal history of malignant neoplasm of tongue: Secondary | ICD-10-CM | POA: Insufficient documentation

## 2015-07-11 DIAGNOSIS — K4090 Unilateral inguinal hernia, without obstruction or gangrene, not specified as recurrent: Secondary | ICD-10-CM

## 2015-07-11 DIAGNOSIS — Z9221 Personal history of antineoplastic chemotherapy: Secondary | ICD-10-CM | POA: Diagnosis not present

## 2015-07-11 DIAGNOSIS — Z885 Allergy status to narcotic agent status: Secondary | ICD-10-CM | POA: Diagnosis not present

## 2015-07-11 DIAGNOSIS — I10 Essential (primary) hypertension: Secondary | ICD-10-CM | POA: Insufficient documentation

## 2015-07-11 HISTORY — PX: INGUINAL HERNIA REPAIR: SHX194

## 2015-07-11 HISTORY — PX: INGUINAL HERNIA REPAIR: SUR1180

## 2015-07-11 HISTORY — PX: INSERTION OF MESH: SHX5868

## 2015-07-11 HISTORY — DX: Essential (primary) hypertension: I10

## 2015-07-11 SURGERY — REPAIR, HERNIA, INGUINAL, ADULT
Anesthesia: General

## 2015-07-11 MED ORDER — MIDAZOLAM HCL 2 MG/2ML IJ SOLN
INTRAMUSCULAR | Status: AC
  Administered 2015-07-11: 1 mg
  Filled 2015-07-11: qty 2

## 2015-07-11 MED ORDER — BUPIVACAINE-EPINEPHRINE (PF) 0.5% -1:200000 IJ SOLN
INTRAMUSCULAR | Status: AC
  Filled 2015-07-11: qty 30

## 2015-07-11 MED ORDER — PHENYLEPHRINE HCL 10 MG/ML IJ SOLN
INTRAMUSCULAR | Status: DC | PRN
  Administered 2015-07-11: 80 ug via INTRAVENOUS

## 2015-07-11 MED ORDER — ONDANSETRON HCL 4 MG/2ML IJ SOLN
4.0000 mg | Freq: Four times a day (QID) | INTRAMUSCULAR | Status: DC | PRN
  Administered 2015-07-11: 4 mg via INTRAVENOUS
  Filled 2015-07-11: qty 2

## 2015-07-11 MED ORDER — SODIUM CHLORIDE 0.9 % IV SOLN
INTRAVENOUS | Status: DC | PRN
  Administered 2015-07-11: 13:00:00 via INTRAVENOUS

## 2015-07-11 MED ORDER — PROPOFOL 10 MG/ML IV BOLUS
INTRAVENOUS | Status: AC
  Filled 2015-07-11: qty 20

## 2015-07-11 MED ORDER — MIDAZOLAM HCL 5 MG/5ML IJ SOLN
INTRAMUSCULAR | Status: DC | PRN
  Administered 2015-07-11 (×2): 1 mg via INTRAVENOUS

## 2015-07-11 MED ORDER — FENTANYL CITRATE (PF) 100 MCG/2ML IJ SOLN
INTRAMUSCULAR | Status: DC | PRN
  Administered 2015-07-11 (×3): 50 ug via INTRAVENOUS

## 2015-07-11 MED ORDER — HYDROCHLOROTHIAZIDE 12.5 MG PO CAPS: 12.5000 mg | ORAL_CAPSULE | Freq: Every day | ORAL | Status: DC

## 2015-07-11 MED ORDER — ONDANSETRON HCL 4 MG/2ML IJ SOLN
INTRAMUSCULAR | Status: DC | PRN
  Administered 2015-07-11: 4 mg via INTRAVENOUS

## 2015-07-11 MED ORDER — MORPHINE SULFATE (PF) 2 MG/ML IV SOLN: 1.0000 mg | INTRAVENOUS | Status: DC | PRN

## 2015-07-11 MED ORDER — ONDANSETRON 4 MG PO TBDP: 4.0000 mg | ORAL_TABLET | Freq: Four times a day (QID) | ORAL | Status: DC | PRN

## 2015-07-11 MED ORDER — LIDOCAINE HCL (CARDIAC) 20 MG/ML IV SOLN
INTRAVENOUS | Status: DC | PRN
  Administered 2015-07-11: 40 mg via INTRAVENOUS

## 2015-07-11 MED ORDER — FENTANYL CITRATE (PF) 250 MCG/5ML IJ SOLN
INTRAMUSCULAR | Status: AC
  Filled 2015-07-11: qty 5

## 2015-07-11 MED ORDER — FENTANYL CITRATE (PF) 100 MCG/2ML IJ SOLN
INTRAMUSCULAR | Status: AC
  Administered 2015-07-11: 50 ug via INTRAVENOUS
  Filled 2015-07-11: qty 2

## 2015-07-11 MED ORDER — LOSARTAN POTASSIUM 50 MG PO TABS
50.0000 mg | ORAL_TABLET | Freq: Once | ORAL | Status: AC
  Administered 2015-07-11: 50 mg via ORAL
  Filled 2015-07-11: qty 1

## 2015-07-11 MED ORDER — BUPIVACAINE-EPINEPHRINE 0.5% -1:200000 IJ SOLN
INTRAMUSCULAR | Status: DC | PRN
  Administered 2015-07-11: 10 mL

## 2015-07-11 MED ORDER — FENTANYL CITRATE (PF) 100 MCG/2ML IJ SOLN
50.0000 ug | Freq: Once | INTRAMUSCULAR | Status: AC
  Administered 2015-07-11: 50 ug via INTRAVENOUS

## 2015-07-11 MED ORDER — ACETAMINOPHEN 650 MG RE SUPP: 650.0000 mg | Freq: Four times a day (QID) | RECTAL | Status: DC | PRN

## 2015-07-11 MED ORDER — LACTATED RINGERS IV SOLN
INTRAVENOUS | Status: DC
  Administered 2015-07-11 (×2): via INTRAVENOUS

## 2015-07-11 MED ORDER — EPHEDRINE SULFATE 50 MG/ML IJ SOLN
INTRAMUSCULAR | Status: DC | PRN
  Administered 2015-07-11 (×2): 10 mg via INTRAVENOUS

## 2015-07-11 MED ORDER — ROCURONIUM BROMIDE 50 MG/5ML IV SOLN
INTRAVENOUS | Status: AC
  Filled 2015-07-11: qty 1

## 2015-07-11 MED ORDER — LOSARTAN POTASSIUM-HCTZ 50-12.5 MG PO TABS: 1.0000 | ORAL_TABLET | Freq: Every day | ORAL | Status: DC

## 2015-07-11 MED ORDER — POTASSIUM CHLORIDE IN NACL 20-0.9 MEQ/L-% IV SOLN
INTRAVENOUS | Status: DC
  Administered 2015-07-11 – 2015-07-12 (×2): via INTRAVENOUS
  Filled 2015-07-11 (×2): qty 1000

## 2015-07-11 MED ORDER — MIDAZOLAM HCL 2 MG/2ML IJ SOLN
INTRAMUSCULAR | Status: AC
  Filled 2015-07-11: qty 4

## 2015-07-11 MED ORDER — 0.9 % SODIUM CHLORIDE (POUR BTL) OPTIME
TOPICAL | Status: DC | PRN
  Administered 2015-07-11: 1000 mL

## 2015-07-11 MED ORDER — PROPOFOL 10 MG/ML IV BOLUS
INTRAVENOUS | Status: DC | PRN
  Administered 2015-07-11: 140 mg via INTRAVENOUS

## 2015-07-11 MED ORDER — HYDROCODONE-ACETAMINOPHEN 5-325 MG PO TABS
1.0000 | ORAL_TABLET | ORAL | Status: DC | PRN
  Administered 2015-07-11 – 2015-07-12 (×3): 2 via ORAL
  Filled 2015-07-11 (×3): qty 2

## 2015-07-11 MED ORDER — ONDANSETRON HCL 4 MG/2ML IJ SOLN
INTRAMUSCULAR | Status: AC
  Filled 2015-07-11: qty 2

## 2015-07-11 MED ORDER — LOSARTAN POTASSIUM 50 MG PO TABS: 50.0000 mg | ORAL_TABLET | Freq: Every day | ORAL | Status: DC

## 2015-07-11 MED ORDER — MIDAZOLAM HCL 2 MG/2ML IJ SOLN: 1.0000 mg | Freq: Once | INTRAMUSCULAR | Status: DC

## 2015-07-11 MED ORDER — LIDOCAINE HCL (CARDIAC) 20 MG/ML IV SOLN
INTRAVENOUS | Status: AC
  Filled 2015-07-11: qty 5

## 2015-07-11 MED ORDER — ACETAMINOPHEN 325 MG PO TABS: 650.0000 mg | ORAL_TABLET | Freq: Four times a day (QID) | ORAL | Status: DC | PRN

## 2015-07-11 SURGICAL SUPPLY — 41 items
BLADE SURG 10 STRL SS (BLADE) ×2 IMPLANT
BLADE SURG 15 STRL LF DISP TIS (BLADE) ×1 IMPLANT
BLADE SURG 15 STRL SS (BLADE) ×1
BLADE SURG ROTATE 9660 (MISCELLANEOUS) IMPLANT
CHLORAPREP W/TINT 26ML (MISCELLANEOUS) ×2 IMPLANT
COVER SURGICAL LIGHT HANDLE (MISCELLANEOUS) ×2 IMPLANT
DRAIN PENROSE 1/2X12 LTX STRL (WOUND CARE) ×2 IMPLANT
DRAPE LAPAROTOMY TRNSV 102X78 (DRAPE) ×2 IMPLANT
DRAPE UTILITY XL STRL (DRAPES) ×4 IMPLANT
ELECT CAUTERY BLADE 6.4 (BLADE) ×2 IMPLANT
ELECT REM PT RETURN 9FT ADLT (ELECTROSURGICAL) ×2
ELECTRODE REM PT RTRN 9FT ADLT (ELECTROSURGICAL) ×1 IMPLANT
GAUZE SPONGE 2X2 8PLY STRL LF (GAUZE/BANDAGES/DRESSINGS) ×1 IMPLANT
GLOVE SURG SIGNA 7.5 PF LTX (GLOVE) ×2 IMPLANT
GLOVE SURG SS PI 7.5 STRL IVOR (GLOVE) ×2 IMPLANT
GOWN STRL REUS W/ TWL LRG LVL3 (GOWN DISPOSABLE) ×2 IMPLANT
GOWN STRL REUS W/ TWL XL LVL3 (GOWN DISPOSABLE) ×1 IMPLANT
GOWN STRL REUS W/TWL LRG LVL3 (GOWN DISPOSABLE) ×2
GOWN STRL REUS W/TWL XL LVL3 (GOWN DISPOSABLE) ×1
KIT BASIN OR (CUSTOM PROCEDURE TRAY) ×2 IMPLANT
KIT ROOM TURNOVER OR (KITS) ×2 IMPLANT
LIQUID BAND (GAUZE/BANDAGES/DRESSINGS) ×2 IMPLANT
MESH PARIETEX PROGRIP LEFT (Mesh General) ×2 IMPLANT
NEEDLE HYPO 25GX1X1/2 BEV (NEEDLE) ×2 IMPLANT
NS IRRIG 1000ML POUR BTL (IV SOLUTION) ×2 IMPLANT
PACK SURGICAL SETUP 50X90 (CUSTOM PROCEDURE TRAY) ×2 IMPLANT
PAD ARMBOARD 7.5X6 YLW CONV (MISCELLANEOUS) ×2 IMPLANT
PENCIL BUTTON HOLSTER BLD 10FT (ELECTRODE) ×2 IMPLANT
SPONGE GAUZE 2X2 STER 10/PKG (GAUZE/BANDAGES/DRESSINGS) ×1
SPONGE LAP 18X18 X RAY DECT (DISPOSABLE) ×2 IMPLANT
SUT MON AB 4-0 PC3 18 (SUTURE) ×2 IMPLANT
SUT SILK 2 0 SH (SUTURE) ×2 IMPLANT
SUT VIC AB 2-0 CT1 27 (SUTURE) ×2
SUT VIC AB 2-0 CT1 TAPERPNT 27 (SUTURE) ×2 IMPLANT
SUT VIC AB 3-0 CT1 27 (SUTURE) ×1
SUT VIC AB 3-0 CT1 TAPERPNT 27 (SUTURE) ×1 IMPLANT
SYR CONTROL 10ML LL (SYRINGE) ×4 IMPLANT
TAPE CLOTH SURG 4X10 WHT LF (GAUZE/BANDAGES/DRESSINGS) ×2 IMPLANT
TOWEL OR 17X26 10 PK STRL BLUE (TOWEL DISPOSABLE) ×2 IMPLANT
TUBE CONNECTING 12X1/4 (SUCTIONS) ×2 IMPLANT
YANKAUER SUCT BULB TIP NO VENT (SUCTIONS) ×2 IMPLANT

## 2015-07-11 NOTE — Anesthesia Procedure Notes (Addendum)
Procedure Name: LMA Insertion Date/Time: 07/11/2015 1:20 PM Performed by: MCMILLEN, MICHAEL L Pre-anesthesia Checklist: Patient identified, Emergency Drugs available, Suction available, Patient being monitored and Timeout performed Patient Re-evaluated:Patient Re-evaluated prior to inductionOxygen Delivery Method: Circle system utilized Preoxygenation: Pre-oxygenation with 100% oxygen Intubation Type: IV induction Ventilation: Mask ventilation without difficulty LMA: LMA inserted LMA Size: 5.0 Number of attempts: 1 Placement Confirmation: positive ETCO2 and breath sounds checked- equal and bilateral Tube secured with: Tape Dental Injury: Teeth and Oropharynx as per pre-operative assessment    Anesthesia Regional Block:  TAP block  Pre-Anesthetic Checklist: ,, timeout performed, Correct Patient, Correct Site, Correct Laterality, Correct Procedure, Correct Position, site marked, Risks and benefits discussed,  Surgical consent,  Pre-op evaluation,  At surgeon's request and post-op pain management  Laterality: Left  Prep: chloraprep       Needles:  Injection technique: Single-shot  Needle Type: Echogenic Stimulator Needle     Needle Length: 9cm 9 cm Needle Gauge: 21 and 21 G    Additional Needles:  Procedures: ultrasound guided (picture in chart) TAP block Narrative:  Start time: 07/11/2015 1:20 PM End time: 07/11/2015 1:25 PM Injection made incrementally with aspirations every 5 mL.  Performed by: Personally   Additional Notes: 30 cc 0.5% bupivacaine with 1:200 epi injected easily

## 2015-07-11 NOTE — Op Note (Signed)
REPAIR OF LEFT INGUINAL HERNIA WITH MESH, INSERTION OF MESH  Procedure Note  Lee Barnes 07/11/2015   Pre-op Diagnosis: Recurrent Left Inguinal Hernia      Post-op Diagnosis: same  Procedure(s): REPAIR OF LEFT INGUINAL HERNIA WITH MESH INSERTION OF MESH  Surgeon(s): Coralie Keens, MD  Anesthesia: General  Staff:  Circulator: Candie Mile, RN Scrub Person: Romero Liner, CST; Milas Kocher, RN  Estimated Blood Loss: Minimal                         Ishaq Maffei A   Date: 07/11/2015  Time: 2:09 PM

## 2015-07-11 NOTE — H&P (Signed)
Lee Barnes is an 64 y.o. male.   Chief Complaint: left inguinal hernia HPI: This is a pleasant gentleman who presents with a recurrent left inguinal hernia. He had bilateral left Inguinal hernia repair with mesh in May of this year. He developed a postoperative hematoma in the preperitoneal space. He also had issues with dizziness and blood pressure issues for several weeks postoperatively. He then on physical examination appeared to have a recurrent hernia after the hematoma resolved. This was confirmed on a CAT scan of the abdomen and pelvis. Because of the large recurrence, open repair with mesh is been recommended. He is otherwise recently been doing well and has no complaints.  Past Medical History  Diagnosis Date  . Diverticulosis 2008  . Allergy to environmental factors   . History of radiation therapy 11/05/10-12/26/10    r base tongue, 7000 cGy 35 sessions  . 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 Heart murmur     hx of in childhood   . Pneumonia     hx of 2012  . History of chemotherapy   . Tinnitus   . Complication of anesthesia     "vageled" after surgery -overnite stay    Past Surgical History  Procedure Laterality Date  . Appendectomy    . Inguinal hernia repair Right 1960  . Lumbar disc surgery    . Tonsillectomy    . Colonscopy     . Inguinal hernia repair Bilateral 03/30/2015    Procedure: LAPAROSCOPIC BILATERAL INGUINAL HERNIA REPAIR WITH MESH;  Surgeon: Coralie Keens, MD;  Location: [facility];  Service: General;  Laterality: Bilateral;    Family History  Problem Relation Age of Onset  . Colon cancer Neg Hx   . Heart disease Father   . Cancer Father     throat ca  . Heart disease Mother    Social History:  reports that he has never smoked. He has never used smokeless tobacco. He reports that he drinks alcohol. He reports that he does not use illicit drugs.  Allergies:  Allergies  Allergen Reactions  .  Codeine Nausea And Vomiting    No prescriptions prior to admission    No results found for this or any previous visit (from the past 48 hour(s)). No results found.  Review of Systems  All other systems reviewed and are negative.   There were no vitals taken for this visit. Physical Exam  Constitutional: He is oriented to person, place, and time. He appears well-nourished. No distress.  HENT:  Head: Normocephalic and atraumatic.  Right Ear: External ear normal.  Left Ear: External ear normal.  Mouth/Throat: Oropharyngeal exudate present.  Eyes: Conjunctivae are normal. Pupils are equal, round, and reactive to light. No scleral icterus.  Neck: Normal range of motion. No tracheal deviation present.  Cardiovascular: Normal rate, regular rhythm, normal heart sounds and intact distal pulses.   Respiratory: Effort normal and breath sounds normal. No respiratory distress.  GI: Soft. Bowel sounds are normal. There is no tenderness. There is no guarding.  Large left inguinal hernia  Musculoskeletal: Normal range of motion. He exhibits no edema or tenderness.  Neurological: He is alert and oriented to person, place, and time.  Skin: Skin is warm. No erythema.  Psychiatric: His behavior is normal. Judgment normal.     Assessment/Plan Recurrent left inguinal hernia  Open repair with mesh is recommended.  I discussed this with him in detail.  I discussed the risks which include but are not limited to bleeding, infection, injury to surrounding structures, chronic pain, recurrence, post op recovery, cardiopulmonary issues, etc.  He agrees to proceed.        Jamil Armwood A 07/11/2015, 7:42 AM

## 2015-07-11 NOTE — Transfer of Care (Signed)
Immediate Anesthesia Transfer of Care Note  Patient: Lee Barnes  Procedure(s) Performed: Procedure(s): REPAIR OF LEFT INGUINAL HERNIA WITH MESH (N/A) INSERTION OF MESH (N/A)  Patient Location: PACU  Anesthesia Type:General  Level of Consciousness: awake, alert , oriented and patient cooperative  Airway & Oxygen Therapy: Patient Spontanous Breathing  Post-op Assessment: Report given to RN, Post -op Vital signs reviewed and stable and Patient moving all extremities  Post vital signs: Reviewed and stable  Last Vitals:  Filed Vitals:   07/11/15 1257  BP: 125/56  Pulse: 68  Temp:   Resp: 14    Complications: No apparent anesthesia complications

## 2015-07-11 NOTE — Progress Notes (Signed)
pts bp 184/100 dr Linna Caprice notified order obtained and carried out

## 2015-07-12 ENCOUNTER — Encounter (HOSPITAL_COMMUNITY): Payer: Self-pay | Admitting: Surgery

## 2015-07-12 DIAGNOSIS — K4091 Unilateral inguinal hernia, without obstruction or gangrene, recurrent: Secondary | ICD-10-CM | POA: Diagnosis not present

## 2015-07-12 LAB — BASIC METABOLIC PANEL
Anion gap: 6 (ref 5–15)
BUN: 11 mg/dL (ref 6–20)
CO2: 26 mmol/L (ref 22–32)
Calcium: 8.9 mg/dL (ref 8.9–10.3)
Chloride: 95 mmol/L — ABNORMAL LOW (ref 101–111)
Creatinine, Ser: 1.16 mg/dL (ref 0.61–1.24)
GFR calc Af Amer: >60 mL/min (ref >60)
GFR calc non Af Amer: >60 mL/min (ref >60)
Glucose, Bld: 112 mg/dL — ABNORMAL HIGH (ref 65–99)
Potassium: 4.9 mmol/L (ref 3.5–5.1)
Sodium: 127 mmol/L — ABNORMAL LOW (ref 135–145)

## 2015-07-12 LAB — CBC
HCT: 34.8 % — ABNORMAL LOW (ref 39.0–52.0)
Hemoglobin: 11.8 g/dL — ABNORMAL LOW (ref 13.0–17.0)
MCH: 28.2 pg (ref 26.0–34.0)
MCHC: 33.9 g/dL (ref 30.0–36.0)
MCV: 83.3 fL (ref 78.0–100.0)
Platelets: 186 10*3/uL (ref 150–400)
RBC: 4.18 MIL/uL — ABNORMAL LOW (ref 4.22–5.81)
RDW: 14.4 % (ref 11.5–15.5)
WBC: 5.4 10*3/uL (ref 4.0–10.5)

## 2015-07-12 MED ORDER — HYDROCODONE-ACETAMINOPHEN 5-325 MG PO TABS: 1.0000 | ORAL_TABLET | Freq: Four times a day (QID) | ORAL | Status: DC | PRN

## 2015-07-12 NOTE — Discharge Instructions (Signed)
[facility]  UMBILICAL OR INGUINAL HERNIA REPAIR: POST OP INSTRUCTIONS  Always review your discharge instruction sheet given to you by the facility where your surgery was performed. IF YOU HAVE DISABILITY OR FAMILY LEAVE FORMS, YOU MUST BRING THEM TO THE OFFICE FOR PROCESSING.   DO NOT GIVE THEM TO YOUR DOCTOR.  1. A  prescription for pain medication may be given to you upon discharge.  Take your pain medication as prescribed, if needed.  If narcotic pain medicine is not needed, then you may take acetaminophen (Tylenol) or ibuprofen (Advil) as needed. 2. Take your usually prescribed medications unless otherwise directed. 3. If you need a refill on your pain medication, please contact your pharmacy.  They will contact our office to request authorization. Prescriptions will not be filled after 5 pm or on week-ends. 4. You should follow a light diet the first 24 hours after arrival home, such as soup and crackers, etc.  Be sure to include lots of fluids daily.  Resume your normal diet the day after surgery. 5. Most patients will experience some swelling and bruising around the umbilicus or in the groin and scrotum.  Ice packs and reclining will help.  Swelling and bruising can take several days to resolve.  6. It is common to experience some constipation if taking pain medication after surgery.  Increasing fluid intake and taking a stool softener (such as Colace) will usually help or prevent this problem from occurring.  A mild laxative (Milk of Magnesia or Miralax) should be taken according to package directions if there are no bowel movements after 48 hours. 7. Unless discharge instructions indicate otherwise, you may remove your bandages 24-48 hours after surgery, and you may shower at that time.  You may have steri-strips (small skin tapes) in place directly over the incision.  These strips should be left on the skin for 7-10 days.  If your surgeon used skin glue on the  incision, you may shower in 24 hours.  The glue will flake off over the next 2-3 weeks.  Any sutures or staples will be removed at the office during your follow-up visit. 8. ACTIVITIES:  You may resume regular (light) daily activities beginning the next day--such as daily self-care, walking, climbing stairs--gradually increasing activities as tolerated.  You may have sexual intercourse when it is comfortable.  Refrain from any heavy lifting or straining until approved by your doctor. a. You may drive when you are no longer taking prescription pain medication, you can comfortably wear a seatbelt, and you can safely maneuver your car and apply brakes. b. RETURN TO WORK:  __________________________________________________________ 9. You should see your doctor in the office for a follow-up appointment approximately 2-3 weeks after your surgery.  Make sure that you call for this appointment within a day or two after you arrive home to insure a convenient appointment time. 10. OTHER INSTRUCTIONS: NO LIFTING MORE THAN 15 TO 20 POUNDS FOR 4 WEEKS 11. MAY SHOWER TODAY 12. ICE PACK AND IBUPROFEN ALSO FOR PAIN __________________________________________________________________________________________________________________________________________________________________________________________  WHEN TO CALL YOUR DOCTOR: 1. Fever over 101.0 2. Inability to urinate 3. Nausea and/or vomiting 4. Extreme swelling or bruising 5. Continued bleeding from incision. 6. Increased pain, redness, or drainage from the incision  The clinic staff is available to answer your questions during regular business hours.  Please dont hesitate to call and ask to speak to one of the nurses for clinical concerns.  If you have a medical emergency, go to the  nearest emergency room or call 911.  A surgeon from [facility] is always on call at the hospital   [address] [place], [place], [place]  [address] ?   [address], [place], [place]   [address] [phone] ? [phone] ? FAX [phone] Web site: [email or web address]

## 2015-07-12 NOTE — Discharge Summary (Signed)
Physician Discharge Summary  Patient ID: YUL DIANA MRN: [id number] DOB/AGE: 64-Jul-1952 64 y.o.  Admit date: 07/11/2015 Discharge date: 07/12/2015  Admission Diagnoses:  Discharge Diagnoses:  Active Problems:   Left inguinal hernia   Discharged Condition: good  Hospital Course: uneventful post op recovery.  Discharged POD#1  Consults: None  Significant Diagnostic Studies:   Treatments: surgery: left inguinal hernia repair with mesh  Discharge Exam: Blood pressure 130/76, pulse 72, temperature 98 F (36.7 C), temperature source Oral, resp. rate 16, height 6' (1.829 m), weight 78.472 kg (173 lb), SpO2 98 %. General appearance: alert, cooperative and no distress Resp: clear to auscultation bilaterally Cardio: regular rate and rhythm, S1, S2 normal, no murmur, click, rub or gallop Incision/Wound:abdomen soft, mild swelling and ecchymosis of left groin incision  Disposition: 01-Home or Self Care     Medication List    TAKE these medications        aspirin 81 MG tablet  Take 81 mg by mouth daily.     cetirizine 10 MG tablet  Commonly known as:  ZYRTEC  Take 10 mg by mouth daily as needed for allergies.     fluticasone 50 MCG/ACT nasal spray  Commonly known as:  FLONASE  Place 2 sprays into both nostrils daily as needed.     HYDROcodone-acetaminophen 5-325 MG per tablet  Commonly known as:  NORCO  Take 1-2 tablets by mouth every 6 (six) hours as needed.     losartan-hydrochlorothiazide 50-12.5 MG per tablet  Commonly known as:  HYZAAR  Take 1 tablet by mouth daily.           Follow-up Information    Follow up with [facility], MD. Schedule an appointment as soon as possible for a visit in 3 weeks.   Specialty:  General Surgery   Contact information:   [address] [place] [address] [phone]       Signed: Harl Bowie 07/12/2015, 8:01 AM

## 2015-07-12 NOTE — Op Note (Signed)
NAME:  Lee Barnes, Lee Barnes NO.:  [id number]  MEDICAL RECORD NO.:  [id number]  LOCATION:  [id number]                        FACILITY:  [facility]  PHYSICIAN:  Abigail Miyamoto, M.D. DATE OF BIRTH:  02-22-1951  DATE OF PROCEDURE:  07/11/2015 DATE OF DISCHARGE:                              OPERATIVE REPORT   PREOPERATIVE DIAGNOSIS:  Recurrent left inguinal hernia.  POSTOPERATIVE DIAGNOSIS:  Recurrent left inguinal hernia.  PROCEDURE:  Repair of recurrent left inguinal hernia with mesh.  SURGEON:  Abigail Miyamoto, M.D.  ANESTHESIA:  General and 0.5% Marcaine.  ESTIMATED BLOOD LOSS:  Minimal.  INDICATIONS:  This is a 64 year old gentleman, who has had a previous laparoscopic left inguinal hernia repair with mesh, it recurred, now presents for open repair.  FINDINGS:  The patient was found to have an indirect hernia.  I could not ascertain to why the previous hernia repair failed other than the old hernia sac slipping around the onlay mesh.  The hernia sac contained sigmoid colon.  The hernia was fixed with a piece of Prolene Proceed ProGrip mesh.  PROCEDURE IN DETAIL:  The patient was brought to the operating room, identified as Lee Barnes.  He was placed supine on the operating room table and general anesthesia was induced.  He had already had a TAP block performed by Anesthesia.  The left lower quadrant was prepped and draped in usual sterile fashion.  I anesthetized the skin with Marcaine and performed a longitudinal incision with a scalpel.  I took this down through Scarpa's fascia with electrocautery.  The external oblique fascia was then identified and opened toward the internal and external ring.  The testicular cord and structures were controlled with Penrose drain.  The patient had a very large hernia sac.  I opened the sac and found to contain sigmoid colon.  I had to free the sigmoid colon from the surrounding sac before I was able to finally be reduced into  the abdominal cavity.  I then tied off the base of the sac with a 2-0 silk suture and excised the redundant sac.  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  Had difficult time trying to remove any lipomas for fear of compromising blood supply.  The testicular cord, I left this in place.  Next, brought a piece of Proceed ProGrip mesh onto the field.  I placed it around the cord structures and secured it to the pubic tubercle, and then brought the rest to lay around and laid it on the floor of the inguinal canal.  I then sewed it to the pubic tubercle and to the transversalis fascia completely encircling the testicular cord structures and the internal ring.  I then closed the external oblique fascia over top of this with running 2-0 Vicryl suture.  Scarpa's fascia was then closed with interrupted 3-0 Vicryl sutures, and skin was closed with running 4-0 Monocryl.  Dermabond was then applied.  The patient tolerated the procedure well.  All the counts were correct at the end of procedure. The patient was then extubated in the operating room and taken in a stable condition to the recovery room.     Abigail Miyamoto,  M.D.     DB/MEDQ  D:  07/11/2015  T:  07/12/2015  Job:  [id number]

## 2015-07-12 NOTE — Progress Notes (Signed)
Patient ID: Lee Barnes, male   DOB: 07-Dec-1950, 64 y.o.   MRN: [id number]  Feels well No dizziness Abdomen soft Some ecchymosis in LLQ  Plan: Discharge home

## 2015-07-12 NOTE — Progress Notes (Signed)
Discharge instructions reviewed with pt and prescription given.  Pt verbalized understanding and had no questions.  Pt discharged in stable condition via wheelchair with wife.  Eliezer Bottom Kalida

## 2015-07-14 NOTE — Anesthesia Preprocedure Evaluation (Signed)
Anesthesia Evaluation  Patient identified by MRN, date of birth, ID band Patient awake    Reviewed: Allergy & Precautions, NPO status , Patient's Chart, lab work & pertinent test results  Airway Mallampati: II  TM Distance: >3 FB Neck ROM: Full    Dental  (+) Teeth Intact, Dental Advisory Given   Pulmonary  breath sounds clear to auscultation        Cardiovascular hypertension, Rhythm:Regular Rate:Normal     Neuro/Psych    GI/Hepatic   Endo/Other    Renal/GU      Musculoskeletal   Abdominal   Peds  Hematology   Anesthesia Other Findings   Reproductive/Obstetrics                             Anesthesia Physical Anesthesia Plan  ASA: II  Anesthesia Plan: General   Post-op Pain Management: MAC Combined w/ Regional for Post-op pain   Induction: Intravenous  Airway Management Planned: LMA  Additional Equipment:   Intra-op Plan:   Post-operative Plan:   Informed Consent: I have reviewed the patients History and Physical, chart, labs and discussed the procedure including the risks, benefits and alternatives for the proposed anesthesia with the patient or authorized representative who has indicated his/her understanding and acceptance.   Dental advisory given  Plan Discussed with: CRNA and Anesthesiologist  Anesthesia Plan Comments:         Anesthesia Quick Evaluation

## 2015-07-14 NOTE — Anesthesia Postprocedure Evaluation (Signed)
  Anesthesia Post-op Note  Patient: Lee Barnes  Procedure(s) Performed: Procedure(s): REPAIR OF LEFT INGUINAL HERNIA WITH MESH (N/A) INSERTION OF MESH (N/A)  Patient Location: PACU  Anesthesia Type:General and GA combined with regional for post-op pain  Level of Consciousness: awake, alert  and oriented  Airway and Oxygen Therapy: Patient Spontanous Breathing  Post-op Pain: none  Post-op Assessment: Post-op Vital signs reviewed, Patient's Cardiovascular Status Stable, Respiratory Function Stable, Patent Airway and Pain level controlled              Post-op Vital Signs: stable  Last Vitals:  Filed Vitals:   07/12/15 0449  BP: 130/76  Pulse: 72  Temp: 36.7 C  Resp: 16    Complications: No apparent anesthesia complications

## 2015-07-19 ENCOUNTER — Telehealth: Payer: Self-pay | Admitting: *Deleted

## 2015-07-19 NOTE — Telephone Encounter (Signed)
CALLED PATIENT TO ASK ABOUT MOVING HIS FU FROM 07-25-15 TO 07-27-15 @ 1:40 PM, LVM FOR A RETURN CALL

## 2015-07-25 ENCOUNTER — Ambulatory Visit: Payer: BC Managed Care – PPO | Admitting: Radiation Oncology

## 2015-07-27 ENCOUNTER — Encounter: Payer: Self-pay | Admitting: Radiation Oncology

## 2015-07-27 ENCOUNTER — Ambulatory Visit
Admission: RE | Admit: 2015-07-27 | Discharge: 2015-07-27 | Disposition: A | Discharge status: Home / Self Care | Payer: BC Managed Care – PPO | Source: Ambulatory Visit | Attending: Radiation Oncology | Admitting: Radiation Oncology

## 2015-07-27 VITALS — BP 150/86 | HR 84 | Temp 98.1°F | Ht 72.0 in | Wt 174.2 lb

## 2015-07-27 DIAGNOSIS — C109 Malignant neoplasm of oropharynx, unspecified: Secondary | ICD-10-CM

## 2015-07-27 NOTE — Progress Notes (Signed)
Mr. Calvert here for reassessments/p XRT to his right base of tongue.  He denies any pain on swallowing and states he can eat anything he wants but has to chew meats longer than in the past and eats slower than in the past.

## 2015-07-27 NOTE — Progress Notes (Signed)
CC: Dr. Radene Journey, Dr. Jill Alexanders  Follow-up note:  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  He is without new complaints today.  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  He has been released by medical oncology, but he did see Dr. Lucia Gaskins for a follow-up visit this past 01/03/2015.  He had hernia surgery with Dr. Ninfa Linden earlier this summer.  He will see Dr. Lucia Gaskins again in February 2017.  To my knowledge she is not had a recent TSH.  His appetite is good and his weight is stable.  He maintains close dental follow-up.  Physical examination: Alert and oriented. Filed Vitals:   07/27/15 1425  BP: 150/86  Pulse: 84  Temp: 98.1 F (36.7 C)   Nodes: There is no palpable lymphadenopathy in the neck.  Oral cavity and oropharynx are unremarkable to inspection.  There is no significant xerostomia.  There is no visible or palpable evidence for recurrent disease along his tongue base.  Indirect mirror examination confirmatory.  Cranial nerves II through XII intact.  Impression: No evidence for recurrent disease.  I told [name] that he should have an annual TSH because of the risk for development of hypothyroidism secondary to his radiation therapy.  He believes that this can be obtained by Dr. Effie Shy, his primary care physician.  Plan: He'll see Dr. Lucia Gaskins for a follow-up visit in February 2016.  Since he will be 5 years out from his treatment I am releasing him to be followed by Dr. Lucia Gaskins and Dr. Redmond School.  I ask Dr. Redmond School to obtain an annual TSH at the time of his annual physical.

## 2015-12-08 ENCOUNTER — Other Ambulatory Visit: Payer: Self-pay | Admitting: Surgery

## 2015-12-15 MED FILL — OXYCODONE/APAP 5-325: 5-325 | 4 days supply | Qty: 40 | Fill #0

## 2016-02-05 ENCOUNTER — Ambulatory Visit (INDEPENDENT_AMBULATORY_CARE_PROVIDER_SITE_OTHER): Payer: BC Managed Care – PPO | Admitting: Family Medicine

## 2016-02-05 ENCOUNTER — Encounter: Payer: Self-pay | Admitting: Family Medicine

## 2016-02-05 VITALS — BP 160/110 | HR 88 | Wt 177.4 lb

## 2016-02-05 DIAGNOSIS — Z79899 Other long term (current) drug therapy: Secondary | ICD-10-CM

## 2016-02-05 DIAGNOSIS — Z8719 Personal history of other diseases of the digestive system: Secondary | ICD-10-CM | POA: Diagnosis not present

## 2016-02-05 DIAGNOSIS — Z9889 Other specified postprocedural states: Secondary | ICD-10-CM | POA: Diagnosis not present

## 2016-02-05 DIAGNOSIS — L259 Unspecified contact dermatitis, unspecified cause: Secondary | ICD-10-CM

## 2016-02-05 DIAGNOSIS — I10 Essential (primary) hypertension: Secondary | ICD-10-CM

## 2016-02-05 DIAGNOSIS — J3089 Other allergic rhinitis: Secondary | ICD-10-CM | POA: Diagnosis not present

## 2016-02-05 DIAGNOSIS — R531 Weakness: Secondary | ICD-10-CM | POA: Diagnosis not present

## 2016-02-05 DIAGNOSIS — Z1159 Encounter for screening for other viral diseases: Secondary | ICD-10-CM

## 2016-02-05 LAB — COMPREHENSIVE METABOLIC PANEL
ALT: 12 U/L (ref 9–46)
AST: 17 U/L (ref 10–35)
Albumin: 4.6 g/dL (ref 3.6–5.1)
Alkaline Phosphatase: 121 U/L — ABNORMAL HIGH (ref 40–115)
BUN: 11 mg/dL (ref 7–25)
CO2: 25 mmol/L (ref 20–31)
Calcium: 9.7 mg/dL (ref 8.6–10.3)
Chloride: 92 mmol/L — ABNORMAL LOW (ref 98–110)
Creat: 1.15 mg/dL (ref 0.70–1.25)
Glucose, Bld: 91 mg/dL (ref 65–99)
Potassium: 4.8 mmol/L (ref 3.5–5.3)
Sodium: 128 mmol/L — ABNORMAL LOW (ref 135–146)
Total Bilirubin: 0.5 mg/dL (ref 0.2–1.2)
Total Protein: 7.6 g/dL (ref 6.1–8.1)

## 2016-02-05 LAB — CBC WITH DIFFERENTIAL/PLATELET
Basophils Absolute: 0 10*3/uL (ref 0.0–0.1)
Basophils Relative: 0 % (ref 0–1)
Eosinophils Absolute: 0.1 10*3/uL (ref 0.0–0.7)
Eosinophils Relative: 2 % (ref 0–5)
HCT: 39.5 % (ref 39.0–52.0)
Hemoglobin: 13.5 g/dL (ref 13.0–17.0)
Lymphocytes Relative: 13 % (ref 12–46)
Lymphs Abs: 0.7 10*3/uL (ref 0.7–4.0)
MCH: 28.8 pg (ref 26.0–34.0)
MCHC: 34.2 g/dL (ref 30.0–36.0)
MCV: 84.2 fL (ref 78.0–100.0)
MPV: 8.8 fL (ref 8.6–12.4)
Monocytes Absolute: 0.6 10*3/uL (ref 0.1–1.0)
Monocytes Relative: 11 % (ref 3–12)
Neutro Abs: 3.7 10*3/uL (ref 1.7–7.7)
Neutrophils Relative %: 74 % (ref 43–77)
Platelets: 251 10*3/uL (ref 150–400)
RBC: 4.69 MIL/uL (ref 4.22–5.81)
RDW: 14.6 % (ref 11.5–15.5)
WBC: 5 10*3/uL (ref 4.0–10.5)

## 2016-02-05 LAB — LIPID PANEL
Cholesterol: 189 mg/dL (ref 125–200)
HDL: 45 mg/dL (ref >=40)
LDL Cholesterol: 112 mg/dL (ref <130)
Total CHOL/HDL Ratio: 4.2 Ratio (ref <=5.0)
Triglycerides: 159 mg/dL — ABNORMAL HIGH (ref <150)
VLDL: 32 mg/dL — ABNORMAL HIGH (ref <30)

## 2016-02-05 LAB — TSH: TSH: 2.62 mIU/L (ref 0.40–4.50)

## 2016-02-05 MED ORDER — FLUTICASONE PROPIONATE 50 MCG/ACT NA SUSP: 2.0000 | Freq: Every day | NASAL | Status: DC | PRN

## 2016-02-05 MED FILL — FLUTICASONE PROP 50 MCG SPR: 50 | 30 days supply | Qty: 16 | Fill #0 | Status: TO

## 2016-02-05 NOTE — Progress Notes (Signed)
Subjective:    Patient ID: Lee Barnes, male    DOB: 1951/02/21, 65 y.o.   MRN: [id number]  HPI He is here for evaluation of difficulty with weakness and occasional dizziness. He states that this has been living him since he had hernia repair surgery and subsequently had have revision. He has been in the hospital 3 times because of this. He also notes that his blood pressure tends to fluctuate. Follow his in hospital he did have abnormal electrolytes with a sodium in the 127 range. He does have underlying allergies and does need his Flonase renewed. He also has a rash present on the right flank area that he notes has been there since he had surgery and did have G monitoring pad placed in that area.   Review of Systems     Objective:   Physical Exam Alert and in no distress. Tympanic membranes and canals are normal. Pharyngeal area is normal. Neck is supple without adenopathy or thyromegaly. Cardiac exam shows a regular sinus rhythm without murmurs or gallops. Lungs are clear to auscultation.lank area does show a linear erythematous area in a triangular pattern.        Assessment & Plan:  Other allergic rhinitis - Plan: fluticasone (FLONASE) 50 MCG/ACT nasal spray  Essential hypertension - Plan: TSH, CBC with Differential/Platelet, Comprehensive metabolic panel, Lipid panel  Weakness - Plan: TSH, CBC with Differential/Platelet, Comprehensive metabolic panel, Lipid panel  Need for hepatitis C screening test - Plan: Hepatitis C antibody  Contact dermatitis  S/P hernia repair  Encounter for long-term (current) use of medications - Plan: TSH, CBC with Differential/Platelet, Comprehensive metabolic panel, Lipid panel plan cortisone cream for the area on the skin. I will do routine blood screening on him to make sure that his electrolytes are back to normal. Monitor his blood pressure. The weakness could easily be because of all the surgeries he's had and slow healing process.

## 2016-02-06 LAB — HEPATITIS C ANTIBODY: HCV Ab: NEGATIVE

## 2016-02-19 ENCOUNTER — Ambulatory Visit (INDEPENDENT_AMBULATORY_CARE_PROVIDER_SITE_OTHER): Payer: BC Managed Care – PPO | Admitting: Family Medicine

## 2016-02-19 ENCOUNTER — Encounter: Payer: Self-pay | Admitting: Family Medicine

## 2016-02-19 VITALS — BP 110/70 | HR 94 | Ht 71.5 in | Wt 178.0 lb

## 2016-02-19 DIAGNOSIS — H9113 Presbycusis, bilateral: Secondary | ICD-10-CM

## 2016-02-19 DIAGNOSIS — N400 Enlarged prostate without lower urinary tract symptoms: Secondary | ICD-10-CM

## 2016-02-19 DIAGNOSIS — C109 Malignant neoplasm of oropharynx, unspecified: Secondary | ICD-10-CM | POA: Diagnosis not present

## 2016-02-19 DIAGNOSIS — I10 Essential (primary) hypertension: Secondary | ICD-10-CM | POA: Diagnosis not present

## 2016-02-19 DIAGNOSIS — E871 Hypo-osmolality and hyponatremia: Secondary | ICD-10-CM

## 2016-02-19 DIAGNOSIS — R5382 Chronic fatigue, unspecified: Secondary | ICD-10-CM

## 2016-02-19 DIAGNOSIS — J302 Other seasonal allergic rhinitis: Secondary | ICD-10-CM

## 2016-02-19 DIAGNOSIS — Z0000 Encounter for general adult medical examination without abnormal findings: Secondary | ICD-10-CM

## 2016-02-19 DIAGNOSIS — N529 Male erectile dysfunction, unspecified: Secondary | ICD-10-CM | POA: Diagnosis not present

## 2016-02-19 DIAGNOSIS — Z125 Encounter for screening for malignant neoplasm of prostate: Secondary | ICD-10-CM

## 2016-02-19 LAB — POCT URINALYSIS DIPSTICK
Bilirubin, UA: NEGATIVE
Blood, UA: NEGATIVE
Glucose, UA: NEGATIVE
Ketones, UA: NEGATIVE
Leukocytes, UA: NEGATIVE
Nitrite, UA: NEGATIVE
Protein, UA: NEGATIVE
Spec Grav, UA: 1.025
Urobilinogen, UA: NEGATIVE
pH, UA: 6

## 2016-02-19 LAB — COMPREHENSIVE METABOLIC PANEL
ALT: 14 U/L (ref 9–46)
AST: 19 U/L (ref 10–35)
Albumin: 4.3 g/dL (ref 3.6–5.1)
Alkaline Phosphatase: 117 U/L — ABNORMAL HIGH (ref 40–115)
BUN: 15 mg/dL (ref 7–25)
CO2: 26 mmol/L (ref 20–31)
Calcium: 9.7 mg/dL (ref 8.6–10.3)
Chloride: 92 mmol/L — ABNORMAL LOW (ref 98–110)
Creat: 1.16 mg/dL (ref 0.70–1.25)
Glucose, Bld: 83 mg/dL (ref 65–99)
Potassium: 3.9 mmol/L (ref 3.5–5.3)
Sodium: 130 mmol/L — ABNORMAL LOW (ref 135–146)
Total Bilirubin: 0.4 mg/dL (ref 0.2–1.2)
Total Protein: 7 g/dL (ref 6.1–8.1)

## 2016-02-19 LAB — TESTOSTERONE: Testosterone: 482 ng/dL (ref 250–827)

## 2016-02-19 MED ORDER — AVANAFIL 200 MG PO TABS
1.0000 | ORAL_TABLET | Freq: Every day | ORAL | Status: DC
Start: 2016-02-19 — End: 2016-05-28

## 2016-02-19 MED ORDER — FINASTERIDE 5 MG PO TABS: 5.0000 mg | ORAL_TABLET | Freq: Every day | ORAL | Status: DC

## 2016-02-19 MED FILL — FINASTERIDE 5 MG TABLET: 5 | 90 days supply | Qty: 90 | Fill #0

## 2016-02-19 NOTE — Progress Notes (Signed)
Subjective:    Patient ID: Lee Barnes, male    DOB: 12-19-1950, 65 y.o.   MRN: [id number]  HPI He is here for complete examination. He has concerns over low testosterone and does admit to decreased energy as well as stamina and question will decreased energy and libido. He also complains of a 2 year history of increasing difficulty with nocturia occurring every 2 hours. He states that it is drinks 3 or 4 glasses of water per day. He also has noted decreased stream, urgency and incomplete emptying but no difficulty with hesitancy. He has a previous history of hyponatremia with his most recent bloodwork of 128. He has a previous history of tongue cancer but is about at the five-year mark for this. He does wear hearing aids bilaterally. His allergies are under good control with Zyrtec and Flonase. Blood pressure is under control with losartan/HCTZ. He has tried [place] in the past for ED.Family and social history as well as health maintenance and immunizations were reviewed. He has no other concerns.   Review of Systems  All other systems reviewed and are negative.      Objective:   Physical Exam BP 110/70 mmHg  Pulse 94  Ht 5' 11.5" (1.816 m)  Wt 178 lb (80.74 kg)  BMI 24.48 kg/m2  General Appearance:    Alert, cooperative, no distress, appears stated age  Head:    Normocephalic, without obvious abnormality, atraumatic  Eyes:    PERRL, conjunctiva/corneas clear, EOM's intact, fundi    benign  Ears:    Normal TM's and external ear canals  Nose:   Nares normal, mucosa normal, no drainage or sinus   tenderness  Throat:   Lips, mucosa, and tongue normal; teeth and gums normal  Neck:   Supple, no lymphadenopathy;  thyroid:  no   enlargement/tenderness/nodules; no carotid   bruit or JVD  Back:    Spine nontender, no curvature, ROM normal, no CVA     tenderness  Lungs:     Clear to auscultation bilaterally without wheezes, rales or     ronchi; respirations unlabored  Chest Wall:    No  tenderness or deformity   Heart:    Regular rate and rhythm, S1 and S2 normal, no murmur, rub   or gallop     Abdomen:     Soft, non-tender, nondistended, normoactive bowel sounds,    no masses, no hepatosplenomegaly  Genitalia:    Normal male external genitalia without lesions.  Testicles without masses.  No inguinal hernias.  Rectal:    Normal sphincter tone, no masses or tenderness; guaiac negative stool.  Prostate smooth, no nodules,  enlarged.  Extremities:   No clubbing, cyanosis or edema  Pulses:   2+ and symmetric all extremities  Skin:   Skin color, texture, turgor normal, no rashes or lesions  Lymph nodes:   Cervical, supraclavicular, and axillary nodes normal  Neurologic:   CNII-XII intact, normal strength, sensation and gait; reflexes 2+ and symmetric throughout          Psych:   Normal mood, affect, hygiene and grooming.          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 I will place him on Proscar. Discussed possible side effects and benefits. Explained that it would take a while before this would work. We'll also check testosterone. Explained that we might need to repeat this test depending upon what the level is. We'll also screen with PSA. Continue other medications. Also recommend increasing salt in his diet. Jerral Ralph written.

## 2016-02-19 NOTE — Patient Instructions (Signed)
Add extra salt to your diet.

## 2016-02-20 LAB — PSA: PSA: 2.51 ng/mL (ref <=4.00)

## 2016-02-28 MED FILL — LOSARTAN-HCTZ 50-12.5 MG TA: 50-12.5 | 90 days supply | Qty: 90 | Fill #0

## 2016-03-22 MED FILL — FLUTICASONE PROP 50 MCG SPR: 50 | 30 days supply | Qty: 16 | Fill #0

## 2016-05-23 MED FILL — FINASTERIDE 5 MG TABLET: 5 | 90 days supply | Qty: 90 | Fill #1

## 2016-05-23 MED FILL — FLUTICASONE PROP 50 MCG SPR: 50 | 30 days supply | Qty: 16 | Fill #1

## 2016-05-24 ENCOUNTER — Other Ambulatory Visit: Payer: Self-pay | Admitting: Family Medicine

## 2016-05-24 MED FILL — LOSARTAN-HCTZ 50-12.5 MG TA: 50-12.5 | 90 days supply | Qty: 90 | Fill #0

## 2016-05-28 ENCOUNTER — Encounter: Payer: Self-pay | Admitting: Family Medicine

## 2016-05-28 ENCOUNTER — Other Ambulatory Visit: Payer: Self-pay | Admitting: Family Medicine

## 2016-05-28 ENCOUNTER — Ambulatory Visit (INDEPENDENT_AMBULATORY_CARE_PROVIDER_SITE_OTHER): Payer: BC Managed Care – PPO | Admitting: Family Medicine

## 2016-05-28 VITALS — BP 150/100 | HR 84 | Resp 16 | Ht 72.0 in | Wt 180.6 lb

## 2016-05-28 DIAGNOSIS — E871 Hypo-osmolality and hyponatremia: Secondary | ICD-10-CM

## 2016-05-28 DIAGNOSIS — I10 Essential (primary) hypertension: Secondary | ICD-10-CM | POA: Diagnosis not present

## 2016-05-28 DIAGNOSIS — R5383 Other fatigue: Secondary | ICD-10-CM | POA: Diagnosis not present

## 2016-05-28 DIAGNOSIS — R42 Dizziness and giddiness: Secondary | ICD-10-CM | POA: Diagnosis not present

## 2016-05-28 LAB — CBC WITH DIFFERENTIAL/PLATELET
Basophils Absolute: 0 cells/uL (ref 0–200)
Basophils Relative: 0 %
Eosinophils Absolute: 92 cells/uL (ref 15–500)
Eosinophils Relative: 2 %
HCT: 34.8 % — ABNORMAL LOW (ref 38.5–50.0)
Hemoglobin: 12.1 g/dL — ABNORMAL LOW (ref 13.2–17.1)
Lymphocytes Relative: 10 %
Lymphs Abs: 460 cells/uL — ABNORMAL LOW (ref 850–3900)
MCH: 28.8 pg (ref 27.0–33.0)
MCHC: 34.8 g/dL (ref 32.0–36.0)
MCV: 82.9 fL (ref 80.0–100.0)
MPV: 8.9 fL (ref 7.5–12.5)
Monocytes Absolute: 552 cells/uL (ref 200–950)
Monocytes Relative: 12 %
Neutro Abs: 3496 cells/uL (ref 1500–7800)
Neutrophils Relative %: 76 %
Platelets: 228 10*3/uL (ref 140–400)
RBC: 4.2 MIL/uL (ref 4.20–5.80)
RDW: 14.1 % (ref 11.0–15.0)
WBC: 4.6 10*3/uL (ref 4.0–10.5)

## 2016-05-28 LAB — COMPREHENSIVE METABOLIC PANEL
ALT: 12 U/L (ref 9–46)
AST: 20 U/L (ref 10–35)
Albumin: 4.4 g/dL (ref 3.6–5.1)
Alkaline Phosphatase: 107 U/L (ref 40–115)
BUN: 16 mg/dL (ref 7–25)
CO2: 23 mmol/L (ref 20–31)
Calcium: 9.1 mg/dL (ref 8.6–10.3)
Chloride: 89 mmol/L — ABNORMAL LOW (ref 98–110)
Creat: 1.27 mg/dL — ABNORMAL HIGH (ref 0.70–1.25)
Glucose, Bld: 97 mg/dL (ref 65–99)
Potassium: 4.2 mmol/L (ref 3.5–5.3)
Sodium: 121 mmol/L — ABNORMAL LOW (ref 135–146)
Total Bilirubin: 0.5 mg/dL (ref 0.2–1.2)
Total Protein: 7.1 g/dL (ref 6.1–8.1)

## 2016-05-28 LAB — TSH: TSH: 3.02 mIU/L (ref 0.40–4.50)

## 2016-05-28 NOTE — Progress Notes (Signed)
Blood pressure comparison here in office is  163/106 (machine), and 160/100 manual. Dr. Redmond School aware. Lee Barnes

## 2016-05-28 NOTE — Progress Notes (Signed)
Subjective:    Patient ID: KAYLOB JAROSZ, male    DOB: [birth year], 65 y.o.   MRN: [id number]  HPI He is here with his wife for evaluation of difficulty with fatigue, dizziness, weakness, falling asleep easily. He relates this to his surgeries back in May 2016 and continued difficulty since then. He was noted to be hyponatremic when he was admitted back at that point. His last sodium level was 130. No headache, blurred vision, double vision, falling to one side. He apparently did have an episode of dizziness and notes his blood pressure was quite low. He does admit to having a lot of work-related stress. He did bring in blood pressure readings from home. The cuff was compared ours and is relatively close.   Review of Systems     Objective:   Physical Exam Alert and in no distress. EOMI. Other cranial nerves grossly intact. Cerebellar testing normal. DTRs normal. Skin shows no pigment changes. Tympanic membranes and canals are normal. Pharyngeal area is normal. Neck is supple without adenopathy or thyromegaly. Cardiac exam shows a regular sinus rhythm without murmurs or gallops. Lungs are clear to auscultation.        Assessment & Plan:  Essential hypertension - Plan: CBC with Differential/Platelet, Comprehensive metabolic panel  Hyponatremia - Plan: Comprehensive metabolic panel  Dizziness - Plan: CBC with Differential/Platelet, Comprehensive metabolic panel, TSH, Testosterone  Other fatigue - Plan: CBC with Differential/Platelet, Comprehensive metabolic panel, TSH, Testosterone Will await low blood results. Must also consider the possibility of Addison's disease although he certainly doesn't have all the stigmata

## 2016-05-29 ENCOUNTER — Ambulatory Visit
Admission: RE | Admit: 2016-05-29 | Discharge: 2016-05-29 | Disposition: A | Discharge status: Home / Self Care | Payer: BC Managed Care – PPO | Source: Ambulatory Visit | Attending: Family Medicine | Admitting: Family Medicine

## 2016-05-29 ENCOUNTER — Other Ambulatory Visit: Payer: BC Managed Care – PPO

## 2016-05-29 DIAGNOSIS — E871 Hypo-osmolality and hyponatremia: Secondary | ICD-10-CM

## 2016-05-29 DIAGNOSIS — R5383 Other fatigue: Secondary | ICD-10-CM

## 2016-05-29 DIAGNOSIS — R42 Dizziness and giddiness: Secondary | ICD-10-CM

## 2016-05-29 LAB — TESTOSTERONE: Testosterone: 503 ng/dL (ref 250–827)

## 2016-05-29 LAB — CORTISOL: Cortisol, Plasma: 9.6 ug/dL

## 2016-05-29 MED ORDER — LOSARTAN POTASSIUM 50 MG PO TABS: 50.0000 mg | ORAL_TABLET | Freq: Every day | ORAL | Status: DC

## 2016-05-29 MED FILL — LOSARTAN POTASSIUM 50 MG TA: 50 | 30 days supply | Qty: 30 | Fill #0

## 2016-05-29 NOTE — Addendum Note (Signed)
Addended by: Denita Lung on: 05/29/2016 01:03 PM   Modules accepted: Orders

## 2016-05-29 NOTE — Progress Notes (Addendum)
Subjective:    Patient ID: Lee Barnes, male    DOB: 25-Mar-1951, 65 y.o.   MRN: [id number]  HPI    Review of Systems     Objective:   Physical Exam        Assessment & Plan:  His case was discussed with Dr. Lucianne Muss. The problem is most likely associated with his diuretic. I left him a phone message concerning stopping the Hyzaar and switching to plain losartan. Also discussed fluids and potentially restricting them to certain extent. Also will get a chest x-ray and a urine osmolality. recheck later next week.

## 2016-05-30 LAB — OSMOLALITY, URINE: Osmolality, Ur: 329 mOsm/kg (ref 50–1200)

## 2016-06-06 ENCOUNTER — Encounter: Payer: Self-pay | Admitting: Family Medicine

## 2016-06-06 ENCOUNTER — Ambulatory Visit (INDEPENDENT_AMBULATORY_CARE_PROVIDER_SITE_OTHER): Payer: BC Managed Care – PPO | Admitting: Family Medicine

## 2016-06-06 VITALS — BP 150/110 | HR 80 | Wt 178.0 lb

## 2016-06-06 DIAGNOSIS — I10 Essential (primary) hypertension: Secondary | ICD-10-CM

## 2016-06-06 DIAGNOSIS — E871 Hypo-osmolality and hyponatremia: Secondary | ICD-10-CM | POA: Diagnosis not present

## 2016-06-06 LAB — COMPREHENSIVE METABOLIC PANEL
ALT: 15 U/L (ref 9–46)
AST: 20 U/L (ref 10–35)
Albumin: 4.3 g/dL (ref 3.6–5.1)
Alkaline Phosphatase: 103 U/L (ref 40–115)
BUN: 14 mg/dL (ref 7–25)
CO2: 25 mmol/L (ref 20–31)
Calcium: 9.5 mg/dL (ref 8.6–10.3)
Chloride: 96 mmol/L — ABNORMAL LOW (ref 98–110)
Creat: 1.13 mg/dL (ref 0.70–1.25)
Glucose, Bld: 94 mg/dL (ref 65–99)
Potassium: 5.5 mmol/L — ABNORMAL HIGH (ref 3.5–5.3)
Sodium: 131 mmol/L — ABNORMAL LOW (ref 135–146)
Total Bilirubin: 0.5 mg/dL (ref 0.2–1.2)
Total Protein: 7.1 g/dL (ref 6.1–8.1)

## 2016-06-06 NOTE — Progress Notes (Signed)
Subjective:    Patient ID: Lee Barnes, male    DOB: 14-Sep-1951, 65 y.o.   MRN: [id number]  HPI He is here for a recheck. He does note increased energy and stamina. He is now able to actually go out and mow his lawn. He continues on the Cozaar. He does have a blood pressure machine at home which is accurate.   Review of Systems     Objective:   Physical Exam alert and in no distress. Blood pressure is recorded       Assessment & Plan:  Hyponatremia - Plan: Comprehensive metabolic panel  Essential hypertension - Plan: Comprehensive metabolic panel He is to double his losartan and call me in 2 weeks. If his blood pressures down I will continue him on 100. If not then I will also add amlodipine to his regimen.

## 2016-06-06 NOTE — Patient Instructions (Signed)
Take 2 of your losartan for the next couple of weeks and then call me. It is not low enough I will then add another medicine

## 2016-06-18 ENCOUNTER — Telehealth: Payer: Self-pay | Admitting: Family Medicine

## 2016-06-18 NOTE — Telephone Encounter (Signed)
Pt called and stated that he was on vacation in [facility] and forgot to get his Losartan refilled before he left. Pt does have an active rx so I call the [facility] on [address] in [place] and called in a 30 day supply. Pt was informed to call insurance and get a vacation override. Pt verbalized understanding.

## 2016-07-05 ENCOUNTER — Telehealth: Payer: Self-pay

## 2016-07-05 NOTE — Telephone Encounter (Signed)
Spoke with pt- he reports that he has been taking 2 pills of the losartan daily. He has not called in to report blood pressure readings as directed on 06/03/2016 OV. He states he has 4 pills left, and will need a refill. Please refill to [facility]. Pt states that he does not have a record of BPs with him as he was at work. He states BPs are still running high and low. Lee Barnes

## 2016-07-07 MED ORDER — LOSARTAN POTASSIUM 100 MG PO TABS: 100.0000 mg | ORAL_TABLET | Freq: Every day | ORAL | 3 refills | Status: DC

## 2016-07-08 MED FILL — FLUTICASONE PROP 50 MCG SPR: 50 | 30 days supply | Qty: 16 | Fill #2

## 2016-07-08 MED FILL — LOSARTAN POTASSIUM 100 MG T: 100 | 90 days supply | Qty: 90 | Fill #0

## 2016-07-24 ENCOUNTER — Encounter: Payer: Self-pay | Admitting: Family Medicine

## 2016-07-24 ENCOUNTER — Ambulatory Visit (INDEPENDENT_AMBULATORY_CARE_PROVIDER_SITE_OTHER): Payer: BC Managed Care – PPO | Admitting: Family Medicine

## 2016-07-24 VITALS — BP 200/110 | HR 96 | Ht 72.0 in | Wt 180.0 lb

## 2016-07-24 DIAGNOSIS — I10 Essential (primary) hypertension: Secondary | ICD-10-CM | POA: Diagnosis not present

## 2016-07-24 DIAGNOSIS — N400 Enlarged prostate without lower urinary tract symptoms: Secondary | ICD-10-CM

## 2016-07-24 MED ORDER — AMLODIPINE BESYLATE 5 MG PO TABS: 5.0000 mg | ORAL_TABLET | Freq: Every day | ORAL | 3 refills | Status: DC

## 2016-07-24 MED ORDER — TAMSULOSIN HCL 0.4 MG PO CAPS: 0.4000 mg | ORAL_CAPSULE | Freq: Every day | ORAL | 3 refills | Status: DC

## 2016-07-24 MED FILL — AMLODIPINE BESYLATE 5 MG TA: 5 | 90 days supply | Qty: 90 | Fill #0

## 2016-07-24 MED FILL — TAMSULOSIN HCL 0.4 MG CAP: 0.4 | 30 days supply | Qty: 30 | Fill #0

## 2016-07-24 NOTE — Patient Instructions (Signed)
Try to stay relatively dehydrated at night. Make sure you anterior bladder.

## 2016-07-24 NOTE — Progress Notes (Signed)
Subjective:    Patient ID: ISSACC TALK, male    DOB: [birth year], 65 y.o.   MRN: [id number]  HPI He is here for recheck on his blood pressure. He did bring his blood pressure readings in and overall they are elevated although a few of them were on the low side. He has a previous history of difficulty with hyponatremia from diuretics. He also is been having difficulty with mainly nocturia. He has been on finasteride and states that it has not really had much of benefit. He has not cut back on fluids at night.   Review of Systems     Objective:   Physical Exam Alert and in no distress. Blood pressure readings were recorded.       Assessment & Plan:  Essential hypertension - Plan: amLODipine (NORVASC) 5 MG tablet  BPH (benign prostatic hyperplasia) - Plan: tamsulosin (FLOMAX) 0.4 MG CAPS capsule I will add Norvasc to his regimen. Encouraged him to check his pressure roughly once per week resting and sitting with his arm at heart level. I will add Flomax to his regimen and recommend he decrease his fluid intake at night. If continued difficulty when he comes back, I will refer to urology.

## 2016-08-05 ENCOUNTER — Ambulatory Visit (INDEPENDENT_AMBULATORY_CARE_PROVIDER_SITE_OTHER): Payer: BC Managed Care – PPO | Admitting: Family Medicine

## 2016-08-05 ENCOUNTER — Encounter: Payer: Self-pay | Admitting: Family Medicine

## 2016-08-05 VITALS — BP 124/80 | HR 68 | Ht 72.0 in | Wt 181.8 lb

## 2016-08-05 DIAGNOSIS — N400 Enlarged prostate without lower urinary tract symptoms: Secondary | ICD-10-CM

## 2016-08-05 DIAGNOSIS — I10 Essential (primary) hypertension: Secondary | ICD-10-CM | POA: Diagnosis not present

## 2016-08-05 DIAGNOSIS — F418 Other specified anxiety disorders: Secondary | ICD-10-CM

## 2016-08-05 MED ORDER — ALPRAZOLAM 0.25 MG PO TABS: 0.2500 mg | ORAL_TABLET | Freq: Three times a day (TID) | ORAL | 0 refills | Status: DC | PRN

## 2016-08-05 NOTE — Progress Notes (Signed)
Chief Complaint  Patient presents with  . Hypotension    this weekend was having low blod pressure readings. Saw Dr.[name] on 9/6 and had bp reading on office of 200/110-amlodipine 5mg  was added to regimen and patient has been taking once daily since. Just has not been feeling well.   . Flu Vaccine    will get through employer.    Patient presents accompanied by his wife--who apparently just learned about new medication being started at last visit (and was upset she didn't know earlier).  He saw Dr. Redmond School on 9/6, at which time his BP was 200/110.  BP's leading up to this visit ranged from 86/61 up to 197/118.  Low values were when he wasn't working (weekends).  He had a lot of significant work stress in July, when he saw these high readings.  He was started on Flomax 0.4mg  (for his BPH complaints) and also started on amlodipine 5mg  for better BP control at that visit. This was added to his regimen of Losartan 100mg , and (and Proscar for the BPH).  He has been taking the flomax in the morning, along with all of his other medications.  Every afternoon last week, since starting the medication, he would feel terrible--fatigued, lightheaded and dizzy.  He needed to lay his head on the desk at work.  BP's ranging from 56/32 up to 135/80, with frequent BP's in the 60's-70's/40's over the last 2 days (the weekend, when not working).  He didn't check BP's 9/9 to 9/15, because he was feeling fine. When feeling dizzy, he would drink gatorade, which would help some.  He has noted some improvement in nocturia the last couple of nights, going every 2 hours, rather than hourly (but also had a lot of fluids, due to low BP).  Hasn't taken losartan today, because his BP's were so low over the weekend. He currently feels fine.  BP's on the weekends seem to be much lower than during the week, with work stress. 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  BP monitor has been verified in the past as accurate.  PMH, PSH, FH, SH reviewed  Outpatient Encounter Prescriptions as of 08/05/2016  Medication Sig Note  . amLODipine (NORVASC) 5 MG tablet Take 1 tablet (5 mg total) by mouth daily.   Marland Kitchen aspirin 81 MG tablet Take 81 mg by mouth daily.  04/01/2015: Pt stopped 5 days before surgery, and has not yet started back up due to brusing  at surgical site  . cetirizine (ZYRTEC) 10 MG tablet Take 10 mg by mouth daily as needed for allergies.    . finasteride (PROSCAR) 5 MG tablet Take 1 tablet (5 mg total) by mouth daily. 05/28/2016: Has not taken today  . fluticasone (FLONASE) 50 MCG/ACT nasal spray Place 2 sprays into both nostrils daily as needed.   . tamsulosin (FLOMAX) 0.4 MG CAPS capsule Take 1 capsule (0.4 mg total) by mouth daily.   Marland Kitchen ALPRAZolam (XANAX) 0.25 MG tablet Take 1 tablet (0.25 mg total) by mouth 3 (three) times daily as needed for anxiety.   Marland Kitchen losartan (COZAAR) 100 MG tablet Take 1 tablet (100 mg total) by mouth daily. (Patient not taking: Reported on 08/05/2016) 08/05/2016: Did not take today.   No facility-administered encounter medications on file as of 08/05/2016.    See HPI  Allergies  Allergen Reactions  . Codeine Nausea And Vomiting  ROS:  Denies fever, chills, headaches, syncope, neurologic complaints (other than the lightheadedness), chest pain, palpitations, edema, shortness of breath, URI complaints, bleeding, bruising or other concerns except as noted in HPI (nocturia).  PHYSICAL EXAM: BP (!) 140/100   Pulse 68   Ht 6' (1.829 m)   Wt 181 lb 12.8 oz (82.5 kg)   BMI 24.66 kg/m   124/80 on repeat by MD  Well developed, well appearing male in no distress Neck: no lymphadenopathy, thyromegaly or mass Heart: regular rate and rhythm without murmur Lungs: clear bilaterally Extremities: no edema Neuro: alert and oriented, cranial nerves intact, normal gait Psych: normal mood, affect, hygiene and  grooming  ASSESSMENT/PLAN:  Essential hypertension - high BP's during week improved, but now having hypotension on weekends (when less stress). Normal today, when losartan dose held  Situational anxiety - stress reduction techniques reviewed, alprazolam prn--risks/side effects reviewed in depth - Plan: ALPRAZolam (XANAX) 0.25 MG tablet  BPH (benign prostatic hyperplasia) - improved slightly--change flomax to HS   Change to taking the flomax (tamsulosin) AT BEDTIME, not in the morning. Continue to take the amlodipine once daily in the morning. For now, hold off on taking the losartan entirely.  Your blood pressure in the office was very good, without having taken it today. Please monitor your blood pressure at work, and if they are consistently >150/90, then restart losartan at 50mg  (1/2 tablet) daily on the days you work only (not on the weekends). Work on stress reduction techniques while at work (taking a break, walk, deep breathing, etc). Consider using alprazolam as needed on extremely stressful days that your blood pressure runs high. Use this sparingly, and be careful as it may cause some sedation.  We are giving you a low dose.  Continue a low sodium diet.  Declines flu shot--will get it elsewhere.  F/u2 weeks

## 2016-08-05 NOTE — Patient Instructions (Signed)
Change to taking the flomax (tamsulosin) AT BEDTIME, not in the morning. Continue to take the amlodipine once daily in the morning. For now, hold off on taking the losartan entirely.  Your blood pressure in the office was very good, without having taken it today. Please monitor your blood pressure at work, and if they are consistently >150/90, then restart losartan at 50mg  (1/2 tablet) daily on the days you work only (not on the weekends). Work on stress reduction techniques while at work (taking a break, walk, deep breathing, etc). Consider using alprazolam as needed on extremely stressful days that your blood pressure runs high. Use this sparingly, and be careful as it may cause some sedation.  We are giving you a low dose.  Continue a low sodium diet.

## 2016-08-09 MED FILL — ALPRAZolam 0.25 MG TABS: 0.25 | 4 days supply | Qty: 10 | Fill #0

## 2016-08-19 ENCOUNTER — Encounter: Payer: Self-pay | Admitting: Family Medicine

## 2016-08-19 ENCOUNTER — Ambulatory Visit (INDEPENDENT_AMBULATORY_CARE_PROVIDER_SITE_OTHER): Payer: BC Managed Care – PPO | Admitting: Family Medicine

## 2016-08-19 VITALS — BP 122/80 | HR 87 | Wt 178.0 lb

## 2016-08-19 DIAGNOSIS — I10 Essential (primary) hypertension: Secondary | ICD-10-CM | POA: Diagnosis not present

## 2016-08-19 NOTE — Progress Notes (Signed)
Subjective:    Patient ID: Lee Barnes, male    DOB: January 27, 1951, 65 y.o.   MRN: [id number]  HPI He is here for recheck on his blood pressure. He continues on medications listed in the chart and did bring blood pressure readings in with him.   Review of Systems     Objective:   Physical Exam Alert and in no distress. Overall the blood pressure readings are brought in were in the normal range.       Assessment & Plan:  Essential hypertension  Continue on present medication regimen.

## 2016-08-21 MED FILL — TAMSULOSIN HCL 0.4 MG CAP: 0.4 | 30 days supply | Qty: 30 | Fill #1

## 2016-08-21 MED FILL — FLUTICASONE PROP 50 MCG SPR: 50 | 30 days supply | Qty: 16 | Fill #3

## 2016-08-21 MED FILL — FINASTERIDE 5 MG TABLET: 5 | 90 days supply | Qty: 90 | Fill #2 | Status: TO

## 2016-08-27 ENCOUNTER — Ambulatory Visit: Payer: BC Managed Care – PPO | Admitting: Family Medicine

## 2016-09-23 MED FILL — TAMSULOSIN HCL 0.4 MG CAP: 0.4 | 30 days supply | Qty: 30 | Fill #2

## 2016-10-22 DIAGNOSIS — M79645 Pain in left finger(s): Secondary | ICD-10-CM | POA: Diagnosis not present

## 2016-10-22 MED FILL — FLUTICASONE PROP 50 MCG SPR: 50 | 30 days supply | Qty: 16 | Fill #4

## 2016-10-22 MED FILL — AMLODIPINE BESYLATE 5 MG TA: 5 | 90 days supply | Qty: 90 | Fill #1

## 2016-10-22 MED FILL — TAMSULOSIN HCL 0.4 MG CAP: 0.4 | 30 days supply | Qty: 30 | Fill #3

## 2016-11-19 MED FILL — FINASTERIDE 5 MG TABLET: 5 | 90 days supply | Qty: 90 | Fill #0

## 2016-11-25 MED FILL — FLUTICASONE PROP 50 MCG SPR: 50 | 30 days supply | Qty: 16 | Fill #5

## 2016-12-11 MED FILL — ACETAMINOPHEN/COD #3 TABLET: 300-30 | 4 days supply | Qty: 15 | Fill #0

## 2017-01-13 MED FILL — AMLODIPINE BESYLATE 5 MG TA: 5 | 90 days supply | Qty: 90 | Fill #2

## 2017-01-13 MED FILL — FLUTICASONE PROP 50 MCG SPR: 50 | 30 days supply | Qty: 16 | Fill #6

## 2017-01-29 ENCOUNTER — Other Ambulatory Visit: Payer: Self-pay | Admitting: Family Medicine

## 2017-01-29 DIAGNOSIS — N400 Enlarged prostate without lower urinary tract symptoms: Secondary | ICD-10-CM

## 2017-01-29 MED FILL — TAMSULOSIN HCL 0.4 MG CAP: 0.4 | 30 days supply | Qty: 30 | Fill #0

## 2017-02-10 MED FILL — CHLORHEXIDINE 0.12% RINSE: 0.12 | 16 days supply | Qty: 473 | Fill #0

## 2017-02-12 ENCOUNTER — Ambulatory Visit (INDEPENDENT_AMBULATORY_CARE_PROVIDER_SITE_OTHER): Payer: Medicare Other | Admitting: Family Medicine

## 2017-02-12 ENCOUNTER — Encounter: Payer: Self-pay | Admitting: Family Medicine

## 2017-02-12 VITALS — BP 150/90 | HR 91 | Wt 177.0 lb

## 2017-02-12 DIAGNOSIS — I10 Essential (primary) hypertension: Secondary | ICD-10-CM

## 2017-02-12 NOTE — Progress Notes (Signed)
Subjective:    Patient ID: KELDEN OREJEL, male    DOB: [birth year], 66 y.o.   MRN: [id number]  HPI He is here for consult concerning his blood pressure. He has been checking this fairly regularly since late February. Review of his blood pressure readings indicate they go from 149/88 down to as low as 81/56. In general his blood pressures have been in a good range. He has on occasion had some difficulty with dizziness. Thinks that since he has now retired, this might have contributed to his blood pressure issues.   Review of Systems     Objective:   Physical Exam Alert and in no distress. Blood pressure is recorded.       Assessment & Plan:  Essential hypertension  I explained that blood pressures can fluctuate. His numbers in general look good although the couple of readings that are in the low range are of concern. Recommend he check his blood pressure on a weekly basis and call me in 2 months with the average blood pressure readings. We might possibly need to readjust the medication and consider stopping it.

## 2017-02-18 DIAGNOSIS — H01001 Unspecified blepharitis right upper eyelid: Secondary | ICD-10-CM | POA: Diagnosis not present

## 2017-02-18 DIAGNOSIS — H01004 Unspecified blepharitis left upper eyelid: Secondary | ICD-10-CM | POA: Diagnosis not present

## 2017-02-18 DIAGNOSIS — H01002 Unspecified blepharitis right lower eyelid: Secondary | ICD-10-CM | POA: Diagnosis not present

## 2017-02-18 DIAGNOSIS — H01005 Unspecified blepharitis left lower eyelid: Secondary | ICD-10-CM | POA: Diagnosis not present

## 2017-02-18 DIAGNOSIS — H04123 Dry eye syndrome of bilateral lacrimal glands: Secondary | ICD-10-CM | POA: Diagnosis not present

## 2017-02-24 ENCOUNTER — Other Ambulatory Visit: Payer: Self-pay | Admitting: Family Medicine

## 2017-02-24 DIAGNOSIS — N400 Enlarged prostate without lower urinary tract symptoms: Secondary | ICD-10-CM

## 2017-02-24 DIAGNOSIS — J3089 Other allergic rhinitis: Secondary | ICD-10-CM

## 2017-02-24 MED FILL — FINASTERIDE 5 MG TABLET: 5 | 90 days supply | Qty: 90 | Fill #0

## 2017-02-24 MED FILL — FLUTICASONE PROP 50 MCG SPR: 50 | 30 days supply | Qty: 16 | Fill #0 | Status: TO

## 2017-02-24 MED FILL — TAMSULOSIN HCL 0.4 MG CAP: 0.4 | 30 days supply | Qty: 30 | Fill #1

## 2017-03-28 ENCOUNTER — Other Ambulatory Visit: Payer: Self-pay | Admitting: Family Medicine

## 2017-03-28 DIAGNOSIS — N400 Enlarged prostate without lower urinary tract symptoms: Secondary | ICD-10-CM

## 2017-03-28 MED FILL — TAMSULOSIN HCL 0.4 MG CAP: 0.4 | 30 days supply | Qty: 30 | Fill #0

## 2017-03-28 NOTE — Telephone Encounter (Signed)
Is this okay to refill? 

## 2017-04-29 MED FILL — FLUTICASONE PROP 50 MCG SPR: 50 | 30 days supply | Qty: 16 | Fill #1 | Status: TO

## 2017-05-05 MED FILL — AMLODIPINE BESYLATE 5 MG TA: 5 | 90 days supply | Qty: 90 | Fill #3

## 2017-05-29 MED FILL — FINASTERIDE 5 MG TABLET: 5 | 90 days supply | Qty: 90 | Fill #1

## 2017-06-04 MED FILL — FLUTICASONE PROP 50 MCG SPR: 50 | 30 days supply | Qty: 16 | Fill #2 | Status: TO

## 2017-07-29 ENCOUNTER — Other Ambulatory Visit: Payer: Self-pay | Admitting: Family Medicine

## 2017-07-29 DIAGNOSIS — I10 Essential (primary) hypertension: Secondary | ICD-10-CM

## 2017-07-29 MED FILL — AMLODIPINE BESYLATE 5 MG TA: 5 | 90 days supply | Qty: 90 | Fill #0

## 2017-07-29 MED FILL — FLUTICASONE PROP 50 MCG SPR: 50 | 30 days supply | Qty: 16 | Fill #3 | Status: TO

## 2017-08-25 MED FILL — FLUTICASONE PROP 50 MCG SPR: 50 | 30 days supply | Qty: 16 | Fill #4 | Status: TO

## 2017-08-25 MED FILL — FINASTERIDE 5 MG TABLET: 5 | 90 days supply | Qty: 90 | Fill #2

## 2017-09-08 DIAGNOSIS — L820 Inflamed seborrheic keratosis: Secondary | ICD-10-CM | POA: Diagnosis not present

## 2017-09-08 DIAGNOSIS — D045 Carcinoma in situ of skin of trunk: Secondary | ICD-10-CM | POA: Diagnosis not present

## 2017-09-08 DIAGNOSIS — D1801 Hemangioma of skin and subcutaneous tissue: Secondary | ICD-10-CM | POA: Diagnosis not present

## 2017-09-08 DIAGNOSIS — L570 Actinic keratosis: Secondary | ICD-10-CM | POA: Diagnosis not present

## 2017-09-08 DIAGNOSIS — Z85828 Personal history of other malignant neoplasm of skin: Secondary | ICD-10-CM | POA: Diagnosis not present

## 2017-09-08 DIAGNOSIS — L821 Other seborrheic keratosis: Secondary | ICD-10-CM | POA: Diagnosis not present

## 2017-09-08 DIAGNOSIS — D485 Neoplasm of uncertain behavior of skin: Secondary | ICD-10-CM | POA: Diagnosis not present

## 2017-10-07 DIAGNOSIS — S43101A Unspecified dislocation of right acromioclavicular joint, initial encounter: Secondary | ICD-10-CM | POA: Diagnosis not present

## 2017-10-13 DIAGNOSIS — Z23 Encounter for immunization: Secondary | ICD-10-CM | POA: Diagnosis not present

## 2017-10-15 DIAGNOSIS — M25511 Pain in right shoulder: Secondary | ICD-10-CM | POA: Diagnosis not present

## 2017-10-15 DIAGNOSIS — R531 Weakness: Secondary | ICD-10-CM | POA: Diagnosis not present

## 2017-10-15 DIAGNOSIS — S46011D Strain of muscle(s) and tendon(s) of the rotator cuff of right shoulder, subsequent encounter: Secondary | ICD-10-CM | POA: Diagnosis not present

## 2017-10-15 MED FILL — FLUTICASONE PROP 50 MCG SPR: 50 | 30 days supply | Qty: 16 | Fill #5 | Status: TO

## 2017-10-21 DIAGNOSIS — R531 Weakness: Secondary | ICD-10-CM | POA: Diagnosis not present

## 2017-10-21 DIAGNOSIS — M25511 Pain in right shoulder: Secondary | ICD-10-CM | POA: Diagnosis not present

## 2017-10-21 DIAGNOSIS — S46011D Strain of muscle(s) and tendon(s) of the rotator cuff of right shoulder, subsequent encounter: Secondary | ICD-10-CM | POA: Diagnosis not present

## 2017-10-23 ENCOUNTER — Other Ambulatory Visit: Payer: Self-pay | Admitting: Family Medicine

## 2017-10-23 DIAGNOSIS — R531 Weakness: Secondary | ICD-10-CM | POA: Diagnosis not present

## 2017-10-23 DIAGNOSIS — M25511 Pain in right shoulder: Secondary | ICD-10-CM | POA: Diagnosis not present

## 2017-10-23 DIAGNOSIS — I10 Essential (primary) hypertension: Secondary | ICD-10-CM

## 2017-10-23 DIAGNOSIS — S46011D Strain of muscle(s) and tendon(s) of the rotator cuff of right shoulder, subsequent encounter: Secondary | ICD-10-CM | POA: Diagnosis not present

## 2017-11-03 DIAGNOSIS — R531 Weakness: Secondary | ICD-10-CM | POA: Diagnosis not present

## 2017-11-03 DIAGNOSIS — M25511 Pain in right shoulder: Secondary | ICD-10-CM | POA: Diagnosis not present

## 2017-11-03 DIAGNOSIS — S46011D Strain of muscle(s) and tendon(s) of the rotator cuff of right shoulder, subsequent encounter: Secondary | ICD-10-CM | POA: Diagnosis not present

## 2017-11-03 MED FILL — AMLODIPINE BESYLATE 5 MG TA: 5 | 90 days supply | Qty: 90 | Fill #0

## 2017-11-17 DIAGNOSIS — S46011D Strain of muscle(s) and tendon(s) of the rotator cuff of right shoulder, subsequent encounter: Secondary | ICD-10-CM | POA: Diagnosis not present

## 2017-11-17 DIAGNOSIS — M25511 Pain in right shoulder: Secondary | ICD-10-CM | POA: Diagnosis not present

## 2017-11-17 DIAGNOSIS — R531 Weakness: Secondary | ICD-10-CM | POA: Diagnosis not present

## 2017-11-19 ENCOUNTER — Ambulatory Visit (INDEPENDENT_AMBULATORY_CARE_PROVIDER_SITE_OTHER): Payer: Medicare Other | Admitting: Family Medicine

## 2017-11-19 ENCOUNTER — Encounter: Payer: Self-pay | Admitting: Family Medicine

## 2017-11-19 VITALS — BP 128/84 | HR 87 | Ht 72.0 in | Wt 173.0 lb

## 2017-11-19 DIAGNOSIS — N401 Enlarged prostate with lower urinary tract symptoms: Secondary | ICD-10-CM

## 2017-11-19 DIAGNOSIS — J069 Acute upper respiratory infection, unspecified: Secondary | ICD-10-CM | POA: Diagnosis not present

## 2017-11-19 DIAGNOSIS — R350 Frequency of micturition: Secondary | ICD-10-CM | POA: Diagnosis not present

## 2017-11-19 DIAGNOSIS — R6884 Jaw pain: Secondary | ICD-10-CM

## 2017-11-19 DIAGNOSIS — Z13220 Encounter for screening for lipoid disorders: Secondary | ICD-10-CM | POA: Diagnosis not present

## 2017-11-19 DIAGNOSIS — N529 Male erectile dysfunction, unspecified: Secondary | ICD-10-CM

## 2017-11-19 DIAGNOSIS — I10 Essential (primary) hypertension: Secondary | ICD-10-CM

## 2017-11-19 DIAGNOSIS — Z125 Encounter for screening for malignant neoplasm of prostate: Secondary | ICD-10-CM | POA: Diagnosis not present

## 2017-11-19 DIAGNOSIS — Z23 Encounter for immunization: Secondary | ICD-10-CM | POA: Diagnosis not present

## 2017-11-19 DIAGNOSIS — Z85819 Personal history of malignant neoplasm of unspecified site of lip, oral cavity, and pharynx: Secondary | ICD-10-CM | POA: Diagnosis not present

## 2017-11-19 DIAGNOSIS — J301 Allergic rhinitis due to pollen: Secondary | ICD-10-CM

## 2017-11-19 DIAGNOSIS — H9113 Presbycusis, bilateral: Secondary | ICD-10-CM

## 2017-11-19 MED ORDER — AMLODIPINE BESYLATE 5 MG PO TABS: 5.0000 mg | ORAL_TABLET | Freq: Every day | ORAL | 3 refills | Status: DC

## 2017-11-19 NOTE — Progress Notes (Addendum)
Lee Barnes is a 67 y.o. male who presents for annual wellness visit and follow-up on chronic medical conditions.  He has the following concerns: He does complain of nasal congestion, rhinorrhea, sinus pressure, coughing but states that he is slightly better from few days ago.  He does have underlying allergies and is using Zyrtec and Flonase mainly for spring and fall symptoms.  He continues on Norvasc for his hypertension.  He does have underlying allergies and is doing well on them.  He does continue to have difficulty with nocturia X2 as well as decreased stream and hesitancy.  He has had no real benefit from both Proscar and Flomax. He was diagnosed with oropharyngeal cancer in 2011 and is now over 7 years since his diagnosis.  He has had some difficulty with ED in the past but none recently.  He is now wearing hearing aids. He also complains of some dental problems and is now having some slight discomfort in the right jaw area.  He does plan to see his dentist tomorrow.  Immunizations and Health Maintenance Immunization History  Administered Date(s) Administered  . Influenza Split 10/07/2011, 09/23/2012  . Pneumococcal-Unspecified 08/18/2014  . Tdap 10/06/2014  . Zoster 12/14/2013   Health Maintenance Due  Topic Date Due  . PNA vac Low Risk Adult (1 of 2 - PCV13) 11/11/2016  . INFLUENZA VACCINE  06/18/2017    Last colonoscopy:  2015, due 2025 Last PSA: year ago Dentist: Dr.Kaster, last seen on Nov 17, 2017 Ophtho: n/a Exercise: physical therapy around 4 weeks ago, 30 minutes 1-2 times weekly. Joined [employer].  Other doctors caring for patient include: Dr. Ronnald Ramp (dermatoligist)   Advanced Directives: No.  Information given.    Depression screen:  See questionnaire below.     Depression screen PHQ 2/9 07/27/2015  Decreased Interest 0  Down, Depressed, Hopeless 0  PHQ - 2 Score 0    Fall Screen: See Questionaire below.   Fall Risk  07/27/2015  Falls in the past year? No     ADL screen:  See questionnaire below.  Functional Status Survey:  No functional status difficulty noted   Review of Systems  Negative except as above   PHYSICAL EXAM:   General Appearance: Alert, cooperative, no distress, appears stated age Head: Normocephalic, without obvious abnormality, atraumatic Eyes: PERRL, conjunctiva/corneas clear, EOM's intact, fundi benign Ears: Normal TM's and external ear canals Nose: Nares normal, mucosa normal, no drainage or sinus   tenderness Throat: Lips, mucosa, and tongue normal; teeth and gums normal.  Exam of the jaw does show slight fullness on the right mid mandibular area. Neck: Supple, no lymphadenopathy, thyroid:no enlargement/tenderness/nodules; no carotid bruit or JVD Lungs: Clear to auscultation bilaterally without wheezes, rales or ronchi; respirations unlabored Heart: Regular rate and rhythm, S1 and S2 normal, no murmur, rub or gallop Abdomen: Soft, non-tender, nondistended, normoactive bowel sounds, no masses, no hepatosplenomegaly Extremities: No clubbing, cyanosis or edema Pulses: 2+ and symmetric all extremities Skin: Skin color, texture, turgor normal, no rashes or lesions Lymph nodes: Cervical, supraclavicular, and axillary nodes normal Neurologic: CNII-XII intact, normal strength, sensation and gait; reflexes 2+ and symmetric throughout   Psych: Normal mood, affect, hygiene and grooming  ASSESSMENT/PLAN: Need for vaccination against Streptococcus pneumoniae - Plan: Pneumococcal conjugate vaccine 13-valent  Essential hypertension - Plan: CBC with Differential/Platelet, Comprehensive metabolic panel, amLODipine (NORVASC) 5 MG tablet  Benign prostatic hyperplasia with urinary frequency - Plan: Ambulatory referral to Urology  Seasonal allergic rhinitis due to pollen  Erectile dysfunction, unspecified erectile dysfunction type  Viral upper respiratory tract infection  Screening for prostate cancer - Plan:  PSA  Screening for lipid disorders - Plan: Lipid panel  History of oropharyngeal cancer He will continue on his present medication regimen.  Recommend he get the new Shingrix vaccine at the drugstore.  No therapy needed for the ED at this time. He will follow-up with his dentist concerning the fullness in the mid mandibular area.    Medicare Attestation I have personally reviewed: The patient's medical and social history Their use of alcohol, tobacco or illicit drugs Their current medications and supplements The patient's functional ability including ADLs,fall risks, home safety risks, cognitive, and hearing and visual impairment Diet and physical activities Evidence for depression or mood disorders  The patient's weight, height, and BMI have been recorded in the chart.  I have made referrals, counseling, and provided education to the patient based on review of the above and I have provided the patient with a written personalized care plan for preventive services.     Jill Alexanders, MD   11/19/2017

## 2017-11-19 NOTE — Patient Instructions (Signed)
  Lee Barnes , Thank you for taking time to come for your Medicare Wellness Visit. I appreciate your ongoing commitment to your health goals. Please review the following plan we discussed and let me know if I can assist you in the future.   These are the goals we discussed: Goals    None      This is a list of the screening recommended for you and due dates:  Health Maintenance  Topic Date Due  . Pneumonia vaccines (1 of 2 - PCV13) 11/11/2016  . Flu Shot  06/18/2017  . Colon Cancer Screening  01/21/2024  . Tetanus Vaccine  10/06/2024  .  Hepatitis C: One time screening is recommended by Center for Disease Control  (CDC) for  adults born from [birth year] through 1965.   Completed

## 2017-11-20 ENCOUNTER — Other Ambulatory Visit: Payer: Self-pay

## 2017-11-20 DIAGNOSIS — M25511 Pain in right shoulder: Secondary | ICD-10-CM | POA: Diagnosis not present

## 2017-11-20 DIAGNOSIS — R748 Abnormal levels of other serum enzymes: Secondary | ICD-10-CM

## 2017-11-20 LAB — CBC WITH DIFFERENTIAL/PLATELET
Basophils Absolute: 10 cells/uL (ref 0–200)
Basophils Relative: 0.2 %
Eosinophils Absolute: 130 cells/uL (ref 15–500)
Eosinophils Relative: 2.7 %
HCT: 40.3 % (ref 38.5–50.0)
Hemoglobin: 13.5 g/dL (ref 13.2–17.1)
Lymphs Abs: 470 cells/uL — ABNORMAL LOW (ref 850–3900)
MCH: 27.8 pg (ref 27.0–33.0)
MCHC: 33.5 g/dL (ref 32.0–36.0)
MCV: 83.1 fL (ref 80.0–100.0)
MPV: 9.4 fL (ref 7.5–12.5)
Monocytes Relative: 10.2 %
Neutro Abs: 3701 cells/uL (ref 1500–7800)
Neutrophils Relative %: 77.1 %
Platelets: 271 10*3/uL (ref 140–400)
RBC: 4.85 10*6/uL (ref 4.20–5.80)
RDW: 13.8 % (ref 11.0–15.0)
Total Lymphocyte: 9.8 %
WBC mixed population: 490 cells/uL (ref 200–950)
WBC: 4.8 10*3/uL (ref 3.8–10.8)

## 2017-11-20 LAB — LIPID PANEL
Cholesterol: 158 mg/dL (ref <200)
HDL: 44 mg/dL (ref >40)
LDL Cholesterol (Calc): 93 mg/dL (calc)
Non-HDL Cholesterol (Calc): 114 mg/dL (calc) (ref <130)
Total CHOL/HDL Ratio: 3.6 (calc) (ref <5.0)
Triglycerides: 115 mg/dL (ref <150)

## 2017-11-20 LAB — PSA: PSA: 3.5 ng/mL (ref <=4.0)

## 2017-11-20 LAB — COMPREHENSIVE METABOLIC PANEL
AG Ratio: 1.3 (calc) (ref 1.0–2.5)
ALT: 9 U/L (ref 9–46)
AST: 15 U/L (ref 10–35)
Albumin: 4.3 g/dL (ref 3.6–5.1)
Alkaline phosphatase (APISO): 132 U/L — ABNORMAL HIGH (ref 40–115)
BUN: 13 mg/dL (ref 7–25)
CO2: 28 mmol/L (ref 20–32)
Calcium: 9.6 mg/dL (ref 8.6–10.3)
Chloride: 95 mmol/L — ABNORMAL LOW (ref 98–110)
Creat: 1.18 mg/dL (ref 0.70–1.25)
Globulin: 3.3 g/dL (calc) (ref 1.9–3.7)
Glucose, Bld: 80 mg/dL (ref 65–99)
Potassium: 4.8 mmol/L (ref 3.5–5.3)
Sodium: 132 mmol/L — ABNORMAL LOW (ref 135–146)
Total Bilirubin: 0.4 mg/dL (ref 0.2–1.2)
Total Protein: 7.6 g/dL (ref 6.1–8.1)

## 2017-11-24 DIAGNOSIS — S46011D Strain of muscle(s) and tendon(s) of the rotator cuff of right shoulder, subsequent encounter: Secondary | ICD-10-CM | POA: Diagnosis not present

## 2017-11-24 DIAGNOSIS — R531 Weakness: Secondary | ICD-10-CM | POA: Diagnosis not present

## 2017-11-24 DIAGNOSIS — M25511 Pain in right shoulder: Secondary | ICD-10-CM | POA: Diagnosis not present

## 2017-12-08 MED FILL — AMOXICILLIN 500 MG CAPSULE: 500 | 10 days supply | Qty: 30 | Fill #0

## 2017-12-22 ENCOUNTER — Encounter: Payer: Self-pay | Admitting: Family Medicine

## 2017-12-22 ENCOUNTER — Ambulatory Visit (INDEPENDENT_AMBULATORY_CARE_PROVIDER_SITE_OTHER): Payer: Medicare Other | Admitting: Family Medicine

## 2017-12-22 VITALS — BP 134/78 | HR 92 | Wt 170.6 lb

## 2017-12-22 DIAGNOSIS — J0110 Acute frontal sinusitis, unspecified: Secondary | ICD-10-CM | POA: Diagnosis not present

## 2017-12-22 DIAGNOSIS — Z85819 Personal history of malignant neoplasm of unspecified site of lip, oral cavity, and pharynx: Secondary | ICD-10-CM | POA: Diagnosis not present

## 2017-12-22 MED ORDER — AMOXICILLIN-POT CLAVULANATE 875-125 MG PO TABS: 1.0000 | ORAL_TABLET | Freq: Two times a day (BID) | ORAL | 0 refills | Status: DC

## 2017-12-22 NOTE — Progress Notes (Signed)
Subjective:    Patient ID: Lee Barnes, male    DOB: Nov 29, 1950, 67 y.o.   MRN: [id number]  HPI He is here for evaluation of a several day history of sore throat, rhinorrhea, cough, congestion and purulent postnasal drainage.  He has a previous history of cough congestion but the PND is new within the last several days. Prior to this he noted in mid December right mandibular swelling.  He was seen by his oral surgeon in early January and x-rays were taken.  He was seen again on January 21 after he felt a popping sensation in his jaw and in pain after that as well as difficulty with malocclusion.  He was placed on Amoxil 3 times per day and finished this Thursday of last week.  He is in the process of being followed by his oral surgeon after the popping sensation and question of fracture of the jaw.  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  He also has a history of a nonhealing tooth extraction prior to that.   Review of Systems     Objective:   Physical Exam Alert and in no distress.  Nasal mucosa is normal with some tenderness over frontal and questionably maxillary sinuses tympanic membranes and canals are normal. Pharyngeal area is normal; he had difficulty opening his mouth due to the pain.  Neck is supple without adenopathy or thyromegaly; the right neck area does feel relatively tight.. Cardiac exam shows a regular sinus rhythm without murmurs or gallops. Lungs are clear to auscultation.        Assessment & Plan:  History of oropharyngeal cancer  Acute non-recurrent frontal sinusitis - Plan: amoxicillin-clavulanate (AUGMENTIN) 875-125 MG tablet  His symptoms of the pain sound like this could potentially be a pathologic fracture and he will continue to be followed by oral surgery. Also little disconcerting that his treatment with an antibiotic did not last very long.  Hopefully the Augmentin will be beneficial but my threshold for scanning  him is quite low.

## 2017-12-23 NOTE — Addendum Note (Signed)
Addended by: Elyse Jarvis on: 12/23/2017 07:49 AM   Modules accepted: Orders

## 2017-12-24 ENCOUNTER — Other Ambulatory Visit: Payer: Medicare Other

## 2017-12-26 ENCOUNTER — Encounter: Payer: Self-pay | Admitting: Internal Medicine

## 2018-01-07 DIAGNOSIS — R3912 Poor urinary stream: Secondary | ICD-10-CM | POA: Diagnosis not present

## 2018-01-07 DIAGNOSIS — N401 Enlarged prostate with lower urinary tract symptoms: Secondary | ICD-10-CM | POA: Diagnosis not present

## 2018-01-07 DIAGNOSIS — R351 Nocturia: Secondary | ICD-10-CM | POA: Diagnosis not present

## 2018-01-07 DIAGNOSIS — R350 Frequency of micturition: Secondary | ICD-10-CM | POA: Diagnosis not present

## 2018-01-24 ENCOUNTER — Telehealth: Payer: Self-pay | Admitting: Family Medicine

## 2018-01-24 NOTE — Telephone Encounter (Signed)
Patient's wife called with concerns regarding patient not being able to open his mouth due to pain. He has ongoing mandible issues due to throat cancer and multiple oral surgeries. She reports patient is staying hydrated and receiving nutritional supplements. He is currently resting per wife after she gave him Tylenol 500 mg and 1/2 alprazolam. She is concerned that she should be doing more for him in regards to his pain and frustration level. States they also have tylenol with codeine and hydrocodone (possibly expired). She is concerned about giving him codeine due to past history of GI upset with these medications.  Advised her to give him a whole alprazolam and continue with Tylenol as needed. Reassured her that she appears to be taking good care of the patient. If she suspects he is getting dehydrated or pain is unable to be managed at home, she will call back or take him to the ED.

## 2018-01-27 ENCOUNTER — Telehealth: Payer: Self-pay | Admitting: Family Medicine

## 2018-01-27 DIAGNOSIS — F418 Other specified anxiety disorders: Secondary | ICD-10-CM

## 2018-01-27 MED ORDER — ALPRAZOLAM 0.25 MG PO TABS: 0.2500 mg | ORAL_TABLET | Freq: Three times a day (TID) | ORAL | 0 refills | Status: DC | PRN

## 2018-01-27 NOTE — Telephone Encounter (Signed)
Pt wife called and is requesting a refill on tony xanax, states he is still having the jaw pain and she gave him on on sat to see if that would help him relax so he could eat and drink, and she thinks it helped him some, she called and talked to vickie who was on call sat, pt uses   [id number] - [place], [place] - [address] rene said if any questions please call her at [phone]

## 2018-02-26 ENCOUNTER — Other Ambulatory Visit: Payer: Self-pay | Admitting: Family Medicine

## 2018-02-26 DIAGNOSIS — J3089 Other allergic rhinitis: Secondary | ICD-10-CM

## 2018-03-09 ENCOUNTER — Telehealth: Payer: Self-pay | Admitting: *Deleted

## 2018-03-09 NOTE — Telephone Encounter (Addendum)
Oncology Nurse Navigator Documentation  Rec'd call from pt's wife.  She reported husband:  Treated about 7 years ago by Dr. Valere Dross (chemo/RT, Dr. Jobe Marker, EOT Feb 2012, [facility] R BOT).  Had R lower mandible molar extraction Jan 2018 with oral surgeon Dr. Hardie Shackleton who subsequently has been following him.  In Dec, developed new onset swelling in same area, saw oral surgeon Hardie Shackleton who xrayed, scan unremarkable.  In Jan of this year, pt eating bacon at breakfast, heard "pop", again saw Dr. Hardie Shackleton who suspected hairline fxt, recommended liquid diet to allow for healing.   He has had multiple panoramic xrays.  During a follow-up, Dr. Hardie Shackleton suggested muscle fibrosis may be source of swelling.  Pt was referred to Dr. Sherwood Gambler, [facility], by Dr. Hardie Shackleton.  They have attempted multiple calls to pt coordinator Janett Billow but have not received call-back with appt.  More recently, pt has experienced more pronounced swelling, numbness lower R mandible, cannot open mouth.  Following my conversation with Dr. Enrique Sack, I called wife, reiterated his guidance to notify Dr. Hardie Shackleton to follow-up on referral.  Gayleen Orem, RN, BSN Head & Neck Oncology Nurse [place] [facility] [place] [phone]

## 2018-03-11 ENCOUNTER — Encounter: Payer: Self-pay | Admitting: Family Medicine

## 2018-03-11 ENCOUNTER — Emergency Department (HOSPITAL_COMMUNITY)
Admission: EM | Admit: 2018-03-11 | Discharge: 2018-03-11 | Disposition: A | Discharge status: Home / Self Care | Payer: Medicare Other | Attending: Emergency Medicine | Admitting: Emergency Medicine

## 2018-03-11 ENCOUNTER — Encounter (HOSPITAL_COMMUNITY): Payer: Self-pay

## 2018-03-11 ENCOUNTER — Other Ambulatory Visit: Payer: Self-pay

## 2018-03-11 ENCOUNTER — Ambulatory Visit (INDEPENDENT_AMBULATORY_CARE_PROVIDER_SITE_OTHER): Payer: Medicare Other | Admitting: Family Medicine

## 2018-03-11 ENCOUNTER — Emergency Department (HOSPITAL_COMMUNITY): Payer: Medicare Other

## 2018-03-11 VITALS — BP 60/38 | HR 88 | Temp 99.4°F | Ht 72.0 in | Wt 160.8 lb

## 2018-03-11 DIAGNOSIS — Z85819 Personal history of malignant neoplasm of unspecified site of lip, oral cavity, and pharynx: Secondary | ICD-10-CM

## 2018-03-11 DIAGNOSIS — I951 Orthostatic hypotension: Secondary | ICD-10-CM | POA: Diagnosis not present

## 2018-03-11 DIAGNOSIS — S02601A Fracture of unspecified part of body of right mandible, initial encounter for closed fracture: Secondary | ICD-10-CM | POA: Diagnosis not present

## 2018-03-11 DIAGNOSIS — Y929 Unspecified place or not applicable: Secondary | ICD-10-CM | POA: Insufficient documentation

## 2018-03-11 DIAGNOSIS — R531 Weakness: Secondary | ICD-10-CM | POA: Diagnosis not present

## 2018-03-11 DIAGNOSIS — S0993XA Unspecified injury of face, initial encounter: Secondary | ICD-10-CM | POA: Diagnosis present

## 2018-03-11 DIAGNOSIS — R509 Fever, unspecified: Secondary | ICD-10-CM

## 2018-03-11 DIAGNOSIS — Y939 Activity, unspecified: Secondary | ICD-10-CM | POA: Diagnosis not present

## 2018-03-11 DIAGNOSIS — I10 Essential (primary) hypertension: Secondary | ICD-10-CM | POA: Insufficient documentation

## 2018-03-11 DIAGNOSIS — R221 Localized swelling, mass and lump, neck: Secondary | ICD-10-CM | POA: Diagnosis not present

## 2018-03-11 DIAGNOSIS — X58XXXA Exposure to other specified factors, initial encounter: Secondary | ICD-10-CM | POA: Diagnosis not present

## 2018-03-11 DIAGNOSIS — Y999 Unspecified external cause status: Secondary | ICD-10-CM | POA: Diagnosis not present

## 2018-03-11 DIAGNOSIS — S02601B Fracture of unspecified part of body of right mandible, initial encounter for open fracture: Secondary | ICD-10-CM | POA: Diagnosis not present

## 2018-03-11 DIAGNOSIS — E860 Dehydration: Secondary | ICD-10-CM | POA: Diagnosis not present

## 2018-03-11 DIAGNOSIS — R05 Cough: Secondary | ICD-10-CM | POA: Diagnosis not present

## 2018-03-11 LAB — CBC
HCT: 33.8 % — ABNORMAL LOW (ref 39.0–52.0)
Hemoglobin: 11.6 g/dL — ABNORMAL LOW (ref 13.0–17.0)
MCH: 27.4 pg (ref 26.0–34.0)
MCHC: 34.3 g/dL (ref 30.0–36.0)
MCV: 79.7 fL (ref 78.0–100.0)
Platelets: 292 10*3/uL (ref 150–400)
RBC: 4.24 MIL/uL (ref 4.22–5.81)
RDW: 15.5 % (ref 11.5–15.5)
WBC: 13.2 10*3/uL — ABNORMAL HIGH (ref 4.0–10.5)

## 2018-03-11 LAB — BASIC METABOLIC PANEL
Anion gap: 11 (ref 5–15)
BUN: 21 mg/dL — ABNORMAL HIGH (ref 6–20)
CO2: 24 mmol/L (ref 22–32)
Calcium: 9.4 mg/dL (ref 8.9–10.3)
Chloride: 86 mmol/L — ABNORMAL LOW (ref 101–111)
Creatinine, Ser: 1.29 mg/dL — ABNORMAL HIGH (ref 0.61–1.24)
GFR calc Af Amer: >60 mL/min (ref >60)
GFR calc non Af Amer: 56 mL/min — ABNORMAL LOW (ref >60)
Glucose, Bld: 97 mg/dL (ref 65–99)
Potassium: 4.8 mmol/L (ref 3.5–5.1)
Sodium: 121 mmol/L — ABNORMAL LOW (ref 135–145)

## 2018-03-11 LAB — URINALYSIS, ROUTINE W REFLEX MICROSCOPIC
Bacteria, UA: NONE SEEN
Bilirubin Urine: NEGATIVE
Glucose, UA: NEGATIVE mg/dL
Ketones, ur: 5 mg/dL — AB
Leukocytes, UA: NEGATIVE
Nitrite: NEGATIVE
Protein, ur: NEGATIVE mg/dL
Specific Gravity, Urine: 1.015 (ref 1.005–1.030)
pH: 5 (ref 5.0–8.0)

## 2018-03-11 MED ORDER — AMOXICILLIN 250 MG/5ML PO SUSR: 500.0000 mg | Freq: Three times a day (TID) | ORAL | 0 refills | Status: DC

## 2018-03-11 MED ORDER — SODIUM CHLORIDE 0.9 % IV BOLUS
1000.0000 mL | Freq: Once | INTRAVENOUS | Status: AC
  Administered 2018-03-11: 1000 mL via INTRAVENOUS

## 2018-03-11 MED ORDER — IOPAMIDOL (ISOVUE-300) INJECTION 61%
INTRAVENOUS | Status: AC
  Filled 2018-03-11: qty 100

## 2018-03-11 MED ORDER — AMOXICILLIN 250 MG/5ML PO SUSR
500.0000 mg | Freq: Once | ORAL | Status: AC
  Administered 2018-03-11: 500 mg via ORAL
  Filled 2018-03-11: qty 10

## 2018-03-11 MED ORDER — IOPAMIDOL (ISOVUE-300) INJECTION 61%
100.0000 mL | Freq: Once | INTRAVENOUS | Status: AC | PRN
  Administered 2018-03-11: 100 mL via INTRAVENOUS

## 2018-03-11 NOTE — Discharge Instructions (Addendum)
Please come early for your 1000 appointment with Dr. Reside.  Please do not eat anything after 0200 this morning.

## 2018-03-11 NOTE — ED Provider Notes (Signed)
Emergency Department Provider Note   I have reviewed the triage vital signs and the nursing notes.   HISTORY  Chief Complaint Hypotension   HPI Lee Barnes is a 67 y.o. male with multiple medical problems as documented below the presents to the emergency department today secondary to orthostatic hypotension in the primary care office.  Patient's history actually goes back quite a ways.  7 years ago he had some type of cancer at the base of his tongue that was treated with radiation and apparently was symptom and disease free but then over the last few months to progressively worsening right-sided jaw swelling, pain sometimes shooting sharp pain that shoots down his jaw.  Is able to x-rays without any evidence of fractures.  He has decreased p.o. intake secondary to the swelling and pain and weight loss secondary to the same.  Is felt lightheaded with quick movements and went to the primary care office today to get reevaluated for the jaw pain and swelling when he is noted to be high though tensive significantly so with orthostatic changes.  Sent here for further evaluation.  Likely his lowest blood pressure was 60 systolic.  He does relate having had a cough and not feeling well yesterday and was sound like chills or Reiger's last night he lost about 20 to 30 minutes that were quite violent.  Did not check his temperature but he felt like he was burning up according to the wife.  No urinary symptoms no other rashes no other associated symptoms. No other associated or modifying symptoms.    Past Medical History:  Diagnosis Date  . Allergy to environmental factors   . Complication of anesthesia    "vageled" after surgery -overnite stay  . Diverticulosis 2008  . Heart murmur    hx of in childhood   . History of chemotherapy   . History of radiation therapy 11/05/10-12/26/10   r base tongue, 7000 cGy 35 sessions  . Hypertension    Medication started in May 2016.  Marland Kitchen Oropharynx cancer  ([place]) 09/2010  . Pneumonia    hx of 2012  . 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 Tinnitus     Patient Active Problem List   Diagnosis Date Noted  . 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 ACE-inhibitor cough 05/25/2015  . Essential hypertension 03/21/2015  . Allergic rhinitis 12/14/2013  . ED (erectile dysfunction) 12/14/2013  . Diverticulosis   . History of oropharyngeal cancer 09/18/2010    Past Surgical History:  Procedure Laterality Date  . APPENDECTOMY    . COLONOSCOPY    . INGUINAL HERNIA REPAIR Bilateral 03/30/2015   Procedure: LAPAROSCOPIC BILATERAL INGUINAL HERNIA REPAIR WITH MESH;  Surgeon: Coralie Keens, MD;  Location: [facility];  Service: General;  Laterality: Bilateral;  . INGUINAL HERNIA REPAIR Right 1960  . INGUINAL HERNIA REPAIR Bilateral 03/30/2015  . INGUINAL HERNIA REPAIR Left 07/11/2015  . INGUINAL HERNIA REPAIR N/A 07/11/2015   Procedure: REPAIR OF LEFT INGUINAL HERNIA WITH MESH;  Surgeon: Coralie Keens, MD;  Location: [place];  Service: General;  Laterality: N/A;  . INSERTION OF MESH N/A 07/11/2015   Procedure: INSERTION OF MESH;  Surgeon: Coralie Keens, MD;  Location: [place];  Service: General;  Laterality: N/A;  . LUMBAR [place]    . TONSILLECTOMY      Current Outpatient Rx  . Order #: [id number] Class: Historical Med  . Order #: [id number] Class: Historical Med  . Order #: [id number] Class:  Normal  . Order #: [id number] Class: Historical Med  . Order #: [id number] Class: Historical Med  . Order #: [id number] Class: Historical Med  . Order #: [id number] Class: Normal  . Order #: [id number] Class: Historical Med  . Order #: [id number] Class: Historical Med  . Order #: [id number] Class: Normal  . Order #: [id number] Class: Normal    Allergies Codeine  Family History  Problem Relation Age of Onset  . Heart disease Father   . Cancer Father        throat ca  . Heart disease Mother   . Colon cancer Neg Hx     Social History Social History    Tobacco Use  . Smoking status: Never Smoker  . Smokeless tobacco: Never Used  Substance Use Topics  . Alcohol use: Yes    Comment: occas drinks a beer every few weeks   . Drug use: No    Comment:      Review of Systems  All other systems negative except as documented in the HPI. All pertinent positives and negatives as reviewed in the HPI. ____________________________________________   PHYSICAL EXAM:  VITAL SIGNS: ED Triage Vitals  Enc Vitals Group     BP 03/11/18 0940 104/71     Pulse Rate 03/11/18 0940 87     Resp 03/11/18 0940 16     Temp 03/11/18 0940 98.9 F (37.2 C)     Temp Source 03/11/18 0940 Oral     SpO2 03/11/18 0940 99 %    Constitutional: Alert and oriented. Well appearing and in no acute distress. Eyes: Conjunctivae are normal. PERRL. EOMI. Head: Atraumatic. Nose: No congestion/rhinnorhea. Mouth/Throat: Mucous membranes are moist.  Oropharynx non-erythematous. Right sided facial swelling at angle of mandible. Trismus present. No malocclusion. No obvious mass intraorally.  Neck: No stridor.  No meningeal signs.   Cardiovascular: Normal rate, regular rhythm. Good peripheral circulation. Grossly normal heart sounds.   Respiratory: Normal respiratory effort.  No retractions. Lungs CTAB. Gastrointestinal: Soft and nontender. No distention.  Musculoskeletal: No lower extremity tenderness nor edema. No gross deformities of extremities. Neurologic:  Normal speech and language. No gross focal neurologic deficits are appreciated.  Skin:  Skin is warm, dry and intact. No rash noted.  ____________________________________________   LABS (all labs ordered are listed, but only abnormal results are displayed)  Labs Reviewed  BASIC METABOLIC PANEL - Abnormal; Notable for the following components:      Result Value   Sodium 121 (*)    Chloride 86 (*)    BUN 21 (*)    Creatinine, Ser 1.29 (*)    GFR calc non Af Amer 56 (*)    All other components within normal  limits  CBC - Abnormal; Notable for the following components:   WBC 13.2 (*)    Hemoglobin 11.6 (*)    HCT 33.8 (*)    All other components within normal limits   ____________________________________________  EKG   EKG Interpretation  Date/Time:  Wednesday March 11 2018 15:51:44 EDT Ventricular Rate:  88 PR Interval:    QRS Duration: 86 QT Interval:  355 QTC Calculation: 430 R Axis:   61 Text Interpretation:  Sinus rhythm Abnormal R-wave progression, early transition ST elevation, consider inferior injury ST elevation in similar pattern and look  to may 2016 Confirmed by Merrily Pew [phone]) on 03/11/2018 6:41:37 PM       ____________________________________________  RADIOLOGY  No results found.  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  Will get CT scan for that.  As far as his low blood pressure were worried about hypovolemic causes but could also be infectious as he did have an symptom of illness yesterday so we will get a chest x-ray give some fluids and check blood cultures and urinalysis.  Ct with fracture. I discussed with Dr. Romie Minus who was on call for Dr. Hardie Shackleton who is the patient's local [facility] and they confirmed that the patient needed more specialized care as could be given to [facility]  I discussed with Dr. Jene Every who was on-call for oral maxillofacial surgery at [facility] who will see him at 10:00 in the morning on as the patient is stable.  I discussed with the family and we will give some fluids and make sure his blood pressure is persistently stable and he will be  discharged to follow-up with them in the morning.  He needs to be n.p.o. after midnight.   Pertinent labs & imaging results that were available during my care of the patient were reviewed by me and considered in my medical decision making (see chart for details).  ____________________________________________  FINAL CLINICAL IMPRESSION(S) / ED DIAGNOSES  Final diagnoses:  None     MEDICATIONS GIVEN DURING THIS VISIT:  Medications - No data to display   NEW OUTPATIENT MEDICATIONS STARTED DURING THIS VISIT:  New Prescriptions   No medications on file    Note:  This note was prepared with assistance of Dragon voice recognition software. Occasional wrong-word or sound-a-like substitutions may have occurred due to the inherent limitations of voice recognition software.   Merrily Pew, MD 03/11/18 [phone]

## 2018-03-11 NOTE — ED Notes (Signed)
Due to more recent vital reassess and blood results will upgrade acuity and prioritize rooming

## 2018-03-11 NOTE — Progress Notes (Signed)
Chief Complaint  Patient presents with  . Fever    woke up trembling wth fever. Shaking uncontrollably for 30 minutes. Was going to go to ER or call EMS. BP was 65/50 pulse 116 at 7:33am. Was clammy, was exposed to sickness last week with grandchildren.     Patient presents as a work-in today, with complaint of fever, chills. Chills were fairly violent, lasted about 30 minutes.   They don't have a thermometer to know temp, but he took tylenol at 4 and again at 6:30 this morning (2 hours ago).  Somewhat hard to swallow the last couple of days--not painful, but feels "restricted", hard to drink a lot.  Denies nausea. He has some chronic PND, uses Flonase.  Denies any ear pain, discolored mucus. He has had some increased congestion the last couple of days.  He isn't really able to cough and get up phlegm.  He has been able to expectorate some phlegm from his throat that is slightly yellow (no different than usual).  Has some recent bleeding from gums (saw specialist, has possible fibrosis), thought related to guard he wears. He has numbness of the right lower jaw.  He has right lower jaw swelling that comes and goes, not significantly different.  He has been getting hot at night the last couple of nights.  Turned AC down to 71 last night, then felt freezing, shaking x 30 mins.  He wasn't able to get warm. He drank a glass of water so far today.  Pt has oropharyngeal cancer, s/p radiation. Poss hairline fracture of mouth/jaw earlier this year (January).  Changed to liquid diet, advanced to soft, but backs down to liquids when he has flares of pain, and unable to open mouth.   He doesn't have f/u scheduled with oral surgeon or other doctors at this point.  +sick contacts (virus from grandchildren, and 1 ended up with conjunctivitis). Wife has also had some congestion and malaise the last couple of days.  PMH, PSH, SH reviewed  No facility-administered encounter medications on file as of  03/11/2018.    Outpatient Encounter Medications as of 03/11/2018  Medication Sig Note  . acetaminophen (TYLENOL) 500 MG tablet Take 500 mg by mouth every 5 hours   . amLODipine (NORVASC) 5 MG tablet Take 1 tablet (5 mg total) by mouth daily. (Patient taking differently: Take 5 mg by mouth at bedtime. )   . cetirizine (ZYRTEC) 10 MG tablet Take 10 mg by mouth at bedtime.    . fluticasone (FLONASE) 50 MCG/ACT nasal spray SPRAY 2 SPRAYS INTO EACH NOSTRIL DAILY AS NEEDED (Patient taking differently: Instill 2 sprays into each nostril at bedtime)   . ALPRAZolam (XANAX) 0.25 MG tablet Take 1 tablet (0.25 mg total) by mouth 3 (three) times daily as needed for anxiety. (Patient not taking: Reported on 03/11/2018)   . finasteride (PROSCAR) 5 MG tablet TAKE 1 TABLET (5 MG TOTAL) BY MOUTH DAILY. (Patient not taking: Reported on 03/11/2018)   . [DISCONTINUED] amoxicillin-clavulanate (AUGMENTIN) 875-125 MG tablet Take 1 tablet by mouth 2 (two) times daily.   . [DISCONTINUED] aspirin 81 MG tablet Take 81 mg by mouth daily.  04/01/2015: Pt stopped 5 days before surgery, and has not yet started back up due to brusing  at surgical site  . [DISCONTINUED] tamsulosin (FLOMAX) 0.4 MG CAPS capsule TAKE 1 CAPSULE (0.4 MG TOTAL) BY MOUTH DAILY. (Patient not taking: Reported on 11/19/2017)    Allergies  Allergen Reactions  . Codeine Nausea And Vomiting  ROS: No nausea, vomiting, diarrhea, headache, chest pain, shortness of breath, urinary complaints, rashes.  Intermittent bleeding from gum per HPI. +URI symptoms and trouble swallowing, intermittent jaw swelling and numbness of jaw, per HPI.     PHYSICAL EXAM:  BP (!) 90/50   Pulse 100   Temp 99.4 F (37.4 C) (Tympanic)   Ht 6' (1.829 m)   Wt 160 lb 12.8 oz (72.9 kg)   BMI 21.81 kg/m   Repeat vitals: Laying: 88/60 Sitting: 60/38  Well appearing, thin male. Speaking comfortably, in no distress. Lying down much of the visit, got warm so sat up, didn't report  dizziness with sitting.  Appears weak/fatigued, but in no acute distress HEENT: conjunctiva and sclera clear, anicteric, EOMI.  Mild edema of nasal mucosa with clear mucus. No erythema or purulence. Sinuses nontender. TM's and EAC's normal. Right facial droop with asymmetry and jaw swelling on right, no change per wife OP--no erythema, exudate.  Mucus membranes not particularly dry. Unable to open mouth very far.  No bleeding or other acute abnormality noted. No visible redness/swelling to suggest any abscess or acute infection Neck: no lymphadenopathy or mass Heart: regular rate and rhythm, no murmur Lungs: clear bilaterally Back: no CVA tenderness Abdomen: soft, nontender, no organomegaly or mass Skin: no rashes, normal turgor. Dry patch on back Extremities: no edema, normal pulses Neuro: alert and oriented, normal movement, gait. Right lower facial droop.   ASSESSMENT/PLAN:  Fever, unspecified fever cause - lowgrade s/p tylenol prior to arrive, likely higher earlier. Ddx includes virus (+sick contacts) vs infection related to throat/jaw. Supportive measures reviewe  Orthostatic hypotension - hypotensive, with limited ability to orally hydrate, especially with recent troubles swallowing. Will send to ER for IV hydration and further eval  History of oropharyngeal cancer  Pt advised to go to ER for further evaluation and treatment, given orthostasis/hypotension.  If no other significant findings found, improves with hydration, then suspect virus from grandchildren, and supportive measures were reviewed in detail. Also discussed fever control measures, and risks of excessive tylenol.

## 2018-03-11 NOTE — Patient Instructions (Addendum)
We are sending you to the emergency room because your blood pressure dropped significantly from laying to sitting, indicating significant dehydration.  Other things we discussed at today's visit:  Be sure NOT to use tylenol excessively to prevent harm to your liver.  Follow the directions on your bottle.  If fever is very high, you can also use ibuprofen, between doses of tylenol.  It is best to have some food on your stomach when you take ibuprofen, and this potentially can increase the bleeding from the gum, so use it sparingly, only if needed.  If you are not able to keep yourself hydrated--if you have very little and very dark urine output, if you are feeling lightheaded and dizzy, you will need to go to the emergency room, as you likely will need IV fluids, as well as further evaluation.  Based on your fairly normal exam today, I suspect that this is a virus, and supportive measures to keep your symptoms controlled are the treatment.  This includes fever control, and control of your congestion. Continue your allergy medications. You can use nasal saline if you need. Consider guaifenesin to help thin out any thick mucus.

## 2018-03-11 NOTE — ED Triage Notes (Signed)
Pt presents for evaluation of orthostatic hypotension. Pt sent by PCP. PCP called this RN and stated he had drop in BP when transitioning from laying to sitting. Pt with existing jaw issue that has resulted in liquid diet, he believes he may be dehydrated.

## 2018-03-11 NOTE — ED Notes (Signed)
Patient transported to X-ray 

## 2018-03-12 DIAGNOSIS — S02609A Fracture of mandible, unspecified, initial encounter for closed fracture: Secondary | ICD-10-CM | POA: Diagnosis not present

## 2018-03-16 LAB — CULTURE, BLOOD (ROUTINE X 2)
Culture: NO GROWTH
Culture: NO GROWTH
Special Requests: ADEQUATE
Special Requests: ADEQUATE

## 2018-03-19 ENCOUNTER — Other Ambulatory Visit: Payer: Self-pay

## 2018-03-19 ENCOUNTER — Telehealth: Payer: Self-pay

## 2018-03-19 ENCOUNTER — Emergency Department (HOSPITAL_BASED_OUTPATIENT_CLINIC_OR_DEPARTMENT_OTHER)
Admission: EM | Admit: 2018-03-19 | Discharge: 2018-03-19 | Disposition: A | Discharge status: Home / Self Care | Payer: Medicare Other | Attending: Emergency Medicine | Admitting: Emergency Medicine

## 2018-03-19 ENCOUNTER — Encounter (HOSPITAL_BASED_OUTPATIENT_CLINIC_OR_DEPARTMENT_OTHER): Payer: Self-pay | Admitting: *Deleted

## 2018-03-19 DIAGNOSIS — Z79899 Other long term (current) drug therapy: Secondary | ICD-10-CM | POA: Diagnosis not present

## 2018-03-19 DIAGNOSIS — E860 Dehydration: Secondary | ICD-10-CM | POA: Diagnosis not present

## 2018-03-19 DIAGNOSIS — R42 Dizziness and giddiness: Secondary | ICD-10-CM | POA: Diagnosis not present

## 2018-03-19 LAB — CBC
HCT: 32.4 % — ABNORMAL LOW (ref 39.0–52.0)
Hemoglobin: 11.1 g/dL — ABNORMAL LOW (ref 13.0–17.0)
MCH: 27.3 pg (ref 26.0–34.0)
MCHC: 34.3 g/dL (ref 30.0–36.0)
MCV: 79.8 fL (ref 78.0–100.0)
Platelets: 342 10*3/uL (ref 150–400)
RBC: 4.06 MIL/uL — ABNORMAL LOW (ref 4.22–5.81)
RDW: 15.8 % — ABNORMAL HIGH (ref 11.5–15.5)
WBC: 4.8 10*3/uL (ref 4.0–10.5)

## 2018-03-19 LAB — COMPREHENSIVE METABOLIC PANEL
ALT: 48 U/L (ref 17–63)
AST: 39 U/L (ref 15–41)
Albumin: 3.8 g/dL (ref 3.5–5.0)
Alkaline Phosphatase: 90 U/L (ref 38–126)
Anion gap: 10 (ref 5–15)
BUN: 18 mg/dL (ref 6–20)
CO2: 25 mmol/L (ref 22–32)
Calcium: 9 mg/dL (ref 8.9–10.3)
Chloride: 90 mmol/L — ABNORMAL LOW (ref 101–111)
Creatinine, Ser: 0.9 mg/dL (ref 0.61–1.24)
GFR calc Af Amer: >60 mL/min (ref >60)
GFR calc non Af Amer: >60 mL/min (ref >60)
Glucose, Bld: 95 mg/dL (ref 65–99)
Potassium: 5.1 mmol/L (ref 3.5–5.1)
Sodium: 125 mmol/L — ABNORMAL LOW (ref 135–145)
Total Bilirubin: 0.5 mg/dL (ref 0.3–1.2)
Total Protein: 7.6 g/dL (ref 6.5–8.1)

## 2018-03-19 MED ORDER — SODIUM CHLORIDE 0.9 % IV BOLUS
1000.0000 mL | Freq: Once | INTRAVENOUS | Status: AC
  Administered 2018-03-19: 1000 mL via INTRAVENOUS

## 2018-03-19 NOTE — Telephone Encounter (Signed)
Pt wife called said pt b/p is all over the place. Last reported 59/42. Pt was advised by Dr. Felicie Morn . Called ahead to [facility] . [place]

## 2018-03-19 NOTE — ED Notes (Signed)
Pt ambulated without any issues °

## 2018-03-19 NOTE — ED Notes (Signed)
Orthostatics completed by [employer]

## 2018-03-19 NOTE — ED Triage Notes (Signed)
His BP has been fluctuating up and down per wife. She notified his MD and he was advised to come here. C/o dizziness at times.

## 2018-03-19 NOTE — Telephone Encounter (Signed)
Patient's wife

## 2018-03-19 NOTE — ED Notes (Signed)
ED Provider at bedside. 

## 2018-03-23 ENCOUNTER — Ambulatory Visit: Payer: Self-pay | Admitting: Family Medicine

## 2018-03-24 DIAGNOSIS — Z923 Personal history of irradiation: Secondary | ICD-10-CM | POA: Diagnosis not present

## 2018-03-24 DIAGNOSIS — Z9221 Personal history of antineoplastic chemotherapy: Secondary | ICD-10-CM | POA: Diagnosis not present

## 2018-03-24 DIAGNOSIS — M8468XA Pathological fracture in other disease, other site, initial encounter for fracture: Secondary | ICD-10-CM | POA: Diagnosis present

## 2018-03-24 DIAGNOSIS — M272 Inflammatory conditions of jaws: Secondary | ICD-10-CM | POA: Diagnosis not present

## 2018-03-24 DIAGNOSIS — Y842 Radiological procedure and radiotherapy as the cause of abnormal reaction of the patient, or of later complication, without mention of misadventure at the time of the procedure: Secondary | ICD-10-CM | POA: Diagnosis not present

## 2018-03-24 DIAGNOSIS — I10 Essential (primary) hypertension: Secondary | ICD-10-CM | POA: Diagnosis present

## 2018-03-24 DIAGNOSIS — S02609A Fracture of mandible, unspecified, initial encounter for closed fracture: Secondary | ICD-10-CM | POA: Diagnosis not present

## 2018-03-24 NOTE — ED Provider Notes (Signed)
[facility] EMERGENCY DEPARTMENT Provider Note   CSN: [id number] Arrival date & time: 03/19/18  1159     History   Chief Complaint Chief Complaint  Patient presents with  . Dizziness    HPI Lee Barnes is a 67 y.o. male.  HPI Patient reports that today he felt mild lightheadedness and with his wife checked his blood pressure blood pressure was elevated.  He was having difficulty managing his blood pressure as of lately.  He has been working with his primary care physician.  Reports his symptoms began this afternoon.  He denies chest pain.  No palpitations.  No shortness of breath.  Reports lightheaded with standing.  Denies melena or hematochezia.  No fevers or chills.  No unilateral arm or leg weakness.  Denies headache.  No changes in mental status.  Symptoms are mild in severity.  His blood pressure was 180/100 at home   Past Medical History:  Diagnosis Date  . Allergy to environmental factors   . Complication of anesthesia    "vageled" after surgery -overnite stay  . Diverticulosis 2008  . Heart murmur    hx of in childhood   . History of chemotherapy   . History of radiation therapy 11/05/10-12/26/10   r base tongue, 7000 cGy 35 sessions  . Hypertension    Medication started in May 2016.  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 Pneumonia    hx of 2012  . 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 Tinnitus     Patient Active Problem List   Diagnosis Date Noted  . 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 ACE-inhibitor cough 05/25/2015  . Essential hypertension 03/21/2015  . Allergic rhinitis 12/14/2013  . ED (erectile dysfunction) 12/14/2013  . Diverticulosis   . History of oropharyngeal cancer 09/18/2010    Past Surgical History:  Procedure Laterality Date  . APPENDECTOMY    . COLONOSCOPY    . INGUINAL HERNIA REPAIR Bilateral 03/30/2015   Procedure: LAPAROSCOPIC BILATERAL INGUINAL HERNIA REPAIR WITH MESH;  Surgeon: Coralie Keens, MD;   Location: [facility];  Service: General;  Laterality: Bilateral;  . INGUINAL HERNIA REPAIR Right 1960  . INGUINAL HERNIA REPAIR Bilateral 03/30/2015  . INGUINAL HERNIA REPAIR Left 07/11/2015  . INGUINAL HERNIA REPAIR N/A 07/11/2015   Procedure: REPAIR OF LEFT INGUINAL HERNIA WITH MESH;  Surgeon: Coralie Keens, MD;  Location: [facility];  Service: General;  Laterality: N/A;  . INSERTION OF MESH N/A 07/11/2015   Procedure: INSERTION OF MESH;  Surgeon: Coralie Keens, MD;  Location: [facility];  Service: General;  Laterality: N/A;  . LUMBAR [place]    . TONSILLECTOMY          Home Medications    Prior to Admission medications   Medication Sig Start Date End Date Taking? Authorizing Provider  acetaminophen (TYLENOL) 500 MG tablet Take 500 mg by mouth every 5 hours   Yes [provider]  Amino Acids (AMINO ACID PO) Take 5 tablets by mouth 2 (two) times daily.   Yes [provider]  amoxicillin-clavulanate (AUGMENTIN) 875-125 MG tablet Take 1 tablet by mouth 2 (two) times daily.   Yes [provider]  Cyanocobalamin (VITAMIN B-12 PO) Take 1 tablet by mouth daily.   Yes [provider]  fluticasone (FLONASE) 50 MCG/ACT nasal spray SPRAY 2 SPRAYS INTO EACH NOSTRIL DAILY AS NEEDED Patient taking differently: Instill 2 sprays into each nostril at bedtime 02/26/18  Yes Jill Alexanders  C, MD  NON FORMULARY "Men's Health" capsules: Take 1 capsule by mouth once time a day   Yes [provider]  amoxicillin (AMOXIL) 250 MG/5ML suspension Take 10 mLs (500 mg total) by mouth 3 (three) times daily. 03/11/18   Mesner, Corene Cornea, MD  cetirizine (ZYRTEC) 10 MG tablet Take 10 mg by mouth at bedtime.     [provider]  feeding supplement (BOOST HIGH PROTEIN) LIQD Take 1 Container by mouth 3 (three) times daily between meals.    [provider]  Magnesium Oxide (MAG-OX 400 PO) Take 400 mg by mouth daily.    [provider]    Family History Family  History  Problem Relation Age of Onset  . Heart disease Father   . Cancer Father        throat ca  . Heart disease Mother   . Colon cancer Neg Hx     Social History Social History   Tobacco Use  . Smoking status: Never Smoker  . Smokeless tobacco: Never Used  Substance Use Topics  . Alcohol use: Yes    Comment: occas drinks a beer every few weeks   . Drug use: No    Comment:       Allergies   Codeine   Review of Systems Review of Systems  All other systems reviewed and are negative.    Physical Exam Updated Vital Signs BP (!) 176/103   Pulse 80   Temp 98 F (36.7 C) (Oral)   Resp 14   Ht 6' (1.829 m)   Wt 72.6 kg (160 lb)   SpO2 96%   BMI 21.70 kg/m   Physical Exam  Constitutional: He is oriented to person, place, and time. He appears well-developed and well-nourished.  HENT:  Head: Normocephalic and atraumatic.  Eyes: EOM are normal.  Neck: Normal range of motion.  Cardiovascular: Normal rate, regular rhythm, normal heart sounds and intact distal pulses.  Pulmonary/Chest: Effort normal and breath sounds normal. No respiratory distress.  Abdominal: Soft. He exhibits no distension. There is no tenderness.  Musculoskeletal: Normal range of motion.  Neurological: He is alert and oriented to person, place, and time.  Skin: Skin is warm and dry.  Psychiatric: He has a normal mood and affect. Judgment normal.  Nursing note and vitals reviewed.    ED Treatments / Results  Labs (all labs ordered are listed, but only abnormal results are displayed) Labs Reviewed  CBC - Abnormal; Notable for the following components:      Result Value   RBC 4.06 (*)    Hemoglobin 11.1 (*)    HCT 32.4 (*)    RDW 15.8 (*)    All other components within normal limits  COMPREHENSIVE METABOLIC PANEL - Abnormal; Notable for the following components:   Sodium 125 (*)    Chloride 90 (*)    All other components within normal limits    EKG None  Radiology No results  found.  Procedures Procedures (including critical care time)  Medications Ordered in ED Medications  sodium chloride 0.9 % bolus 1,000 mL (0 mLs Intravenous Stopped 03/19/18 1405)  sodium chloride 0.9 % bolus 1,000 mL (0 mLs Intravenous Stopped 03/19/18 1453)     Initial Impression / Assessment and Plan / ED Course  I have reviewed the triage vital signs and the nursing notes.  Pertinent labs & imaging results that were available during my care of the patient were reviewed by me and considered in my medical decision  making (see chart for details).     Overall the patient is well-appearing.  He is asymptomatic at this time.  He has a reassuring neurologic exam.  His blood pressure is improved in the emergency department without intervention.  I do not believe the patient needs any additional work-up at this time.  Close primary care follow-up.  He understands return to the ER for new or worsening symptoms.  I suspect some of his symptoms may be mild dehydration.  Feels better after IV fluids.  Final Clinical Impressions(s) / ED Diagnoses   Final diagnoses:  Dehydration    ED Discharge Orders    None       Jola Schmidt, MD 03/24/18 1043

## 2018-03-26 ENCOUNTER — Telehealth: Payer: Self-pay | Admitting: Family Medicine

## 2018-03-26 NOTE — Telephone Encounter (Signed)
Pt thanks you for quick reply. [place]

## 2018-03-26 NOTE — Telephone Encounter (Signed)
Ptw's wife called back stating that giving pt Zofran Gatorade every so often has helped with nausea. Wife will be waking pt up to give him a little more Gatorade at 11:30 and he will be up to a total of about 11 oz of Gatorade for the day. Should Wife, Joseph Art, start giving pt his antibiotic and Ibuprofen again now? Is the liquid enough on his stomach to continue on with his meds or is it too risky to start the nausea again?

## 2018-03-26 NOTE — Telephone Encounter (Signed)
Wife called back stating that surgeons off called her and gave directions to start antibiotic only and gave some other direction [name] said to thank Dr Redmond School for the quick response and his help.

## 2018-03-26 NOTE — Telephone Encounter (Signed)
Pt had oral surgery at [facility] and left the hospital yesterday. He has been having bouts of throwing up since then. She states that pt was given Zofran 4 mg every 8 hours by the hospital with #20. He has taken that a couple of time and it works for about an hour. She states that at this point it consist of bile. Pt has no fever or chills. She is also concerned with pt becoming dehydrated however pt did have IV fluids at hospital. Pt was informed that [facility] will send in the max  strength of Zofran but she has concerns pt may not be able to keep it down. She inquired about maybe a suppository. [facility] in [facility] and can be reached at [phone].

## 2018-03-26 NOTE — Telephone Encounter (Signed)
If he is keeping liquids down, start back on his medications.

## 2018-03-26 NOTE — Telephone Encounter (Signed)
He had recent surgery done in [facility] and is having difficulty with nausea.  4 mg of Zofran is not holding him.  I told his wife to increase it to 8 mg and then give 1 to 2 ounces of fluids every 20 minutes to try and get him better hydrated.

## 2018-03-27 ENCOUNTER — Encounter (HOSPITAL_BASED_OUTPATIENT_CLINIC_OR_DEPARTMENT_OTHER): Payer: Medicare Other | Attending: Internal Medicine

## 2018-03-27 ENCOUNTER — Telehealth: Payer: Self-pay

## 2018-03-27 DIAGNOSIS — M272 Inflammatory conditions of jaws: Secondary | ICD-10-CM | POA: Insufficient documentation

## 2018-03-27 DIAGNOSIS — L598 Other specified disorders of the skin and subcutaneous tissue related to radiation: Secondary | ICD-10-CM | POA: Insufficient documentation

## 2018-03-27 DIAGNOSIS — I10 Essential (primary) hypertension: Secondary | ICD-10-CM | POA: Insufficient documentation

## 2018-03-27 DIAGNOSIS — Z85810 Personal history of malignant neoplasm of tongue: Secondary | ICD-10-CM | POA: Insufficient documentation

## 2018-03-27 DIAGNOSIS — Z923 Personal history of irradiation: Secondary | ICD-10-CM | POA: Insufficient documentation

## 2018-03-27 DIAGNOSIS — Z9221 Personal history of antineoplastic chemotherapy: Secondary | ICD-10-CM | POA: Insufficient documentation

## 2018-03-27 MED ORDER — ONDANSETRON HCL 8 MG PO TABS: 8.0000 mg | ORAL_TABLET | Freq: Three times a day (TID) | ORAL | 0 refills | Status: DC | PRN

## 2018-03-27 NOTE — Telephone Encounter (Signed)
Patient's wife called wanting to get refill on zophran 8mg  [facility].  Patient's wife also wants to get some advice on eating and drinking and getting his strength up.  His BP is running low.  Please call her back at [phone].

## 2018-03-28 ENCOUNTER — Other Ambulatory Visit: Payer: Self-pay

## 2018-03-28 ENCOUNTER — Encounter (HOSPITAL_BASED_OUTPATIENT_CLINIC_OR_DEPARTMENT_OTHER): Payer: Self-pay | Admitting: Emergency Medicine

## 2018-03-28 ENCOUNTER — Inpatient Hospital Stay (HOSPITAL_BASED_OUTPATIENT_CLINIC_OR_DEPARTMENT_OTHER)
Admission: EM | Admit: 2018-03-28 | Discharge: 2018-03-31 | DRG: 641 | Disposition: A | Discharge status: Home / Self Care | Payer: Medicare Other | Attending: Internal Medicine | Admitting: Internal Medicine

## 2018-03-28 DIAGNOSIS — Z85818 Personal history of malignant neoplasm of other sites of lip, oral cavity, and pharynx: Secondary | ICD-10-CM

## 2018-03-28 DIAGNOSIS — Z85810 Personal history of malignant neoplasm of tongue: Secondary | ICD-10-CM | POA: Diagnosis not present

## 2018-03-28 DIAGNOSIS — Z85819 Personal history of malignant neoplasm of unspecified site of lip, oral cavity, and pharynx: Secondary | ICD-10-CM | POA: Diagnosis present

## 2018-03-28 DIAGNOSIS — Z923 Personal history of irradiation: Secondary | ICD-10-CM | POA: Diagnosis not present

## 2018-03-28 DIAGNOSIS — E860 Dehydration: Secondary | ICD-10-CM | POA: Diagnosis not present

## 2018-03-28 DIAGNOSIS — I10 Essential (primary) hypertension: Secondary | ICD-10-CM | POA: Diagnosis present

## 2018-03-28 DIAGNOSIS — E440 Moderate protein-calorie malnutrition: Secondary | ICD-10-CM

## 2018-03-28 DIAGNOSIS — E861 Hypovolemia: Secondary | ICD-10-CM | POA: Diagnosis present

## 2018-03-28 DIAGNOSIS — E871 Hypo-osmolality and hyponatremia: Secondary | ICD-10-CM | POA: Diagnosis not present

## 2018-03-28 DIAGNOSIS — R1110 Vomiting, unspecified: Secondary | ICD-10-CM | POA: Diagnosis not present

## 2018-03-28 DIAGNOSIS — Z9221 Personal history of antineoplastic chemotherapy: Secondary | ICD-10-CM | POA: Diagnosis not present

## 2018-03-28 LAB — COMPREHENSIVE METABOLIC PANEL
ALT: 65 U/L — ABNORMAL HIGH (ref 17–63)
AST: 60 U/L — ABNORMAL HIGH (ref 15–41)
Albumin: 3.7 g/dL (ref 3.5–5.0)
Alkaline Phosphatase: 81 U/L (ref 38–126)
Anion gap: 12 (ref 5–15)
BUN: 22 mg/dL — ABNORMAL HIGH (ref 6–20)
CO2: 21 mmol/L — ABNORMAL LOW (ref 22–32)
Calcium: 9 mg/dL (ref 8.9–10.3)
Chloride: 81 mmol/L — ABNORMAL LOW (ref 101–111)
Creatinine, Ser: 0.92 mg/dL (ref 0.61–1.24)
GFR calc Af Amer: >60 mL/min (ref >60)
GFR calc non Af Amer: >60 mL/min (ref >60)
Glucose, Bld: 97 mg/dL (ref 65–99)
Potassium: 4.5 mmol/L (ref 3.5–5.1)
Sodium: 114 mmol/L — CL (ref 135–145)
Total Bilirubin: 0.9 mg/dL (ref 0.3–1.2)
Total Protein: 7.5 g/dL (ref 6.5–8.1)

## 2018-03-28 LAB — CBC WITH DIFFERENTIAL/PLATELET
Basophils Absolute: 0 10*3/uL (ref 0.0–0.1)
Basophils Relative: 0 %
Eosinophils Absolute: 0 10*3/uL (ref 0.0–0.7)
Eosinophils Relative: 1 %
HCT: 35.1 % — ABNORMAL LOW (ref 39.0–52.0)
Hemoglobin: 12.8 g/dL — ABNORMAL LOW (ref 13.0–17.0)
Lymphocytes Relative: 7 %
Lymphs Abs: 0.5 10*3/uL — ABNORMAL LOW (ref 0.7–4.0)
MCH: 27.1 pg (ref 26.0–34.0)
MCHC: 36.5 g/dL — ABNORMAL HIGH (ref 30.0–36.0)
MCV: 74.2 fL — ABNORMAL LOW (ref 78.0–100.0)
Monocytes Absolute: 1.2 10*3/uL — ABNORMAL HIGH (ref 0.1–1.0)
Monocytes Relative: 16 %
Neutro Abs: 5.5 10*3/uL (ref 1.7–7.7)
Neutrophils Relative %: 76 %
Platelets: 364 10*3/uL (ref 150–400)
RBC: 4.73 MIL/uL (ref 4.22–5.81)
RDW: 15.2 % (ref 11.5–15.5)
WBC: 7.2 10*3/uL (ref 4.0–10.5)

## 2018-03-28 LAB — CBC
HCT: 32.8 % — ABNORMAL LOW (ref 39.0–52.0)
Hemoglobin: 11.4 g/dL — ABNORMAL LOW (ref 13.0–17.0)
MCH: 27 pg (ref 26.0–34.0)
MCHC: 34.8 g/dL (ref 30.0–36.0)
MCV: 77.5 fL — ABNORMAL LOW (ref 78.0–100.0)
Platelets: 319 10*3/uL (ref 150–400)
RBC: 4.23 MIL/uL (ref 4.22–5.81)
RDW: 15.2 % (ref 11.5–15.5)
WBC: 6.3 10*3/uL (ref 4.0–10.5)

## 2018-03-28 LAB — BASIC METABOLIC PANEL
Anion gap: 9 (ref 5–15)
BUN: 18 mg/dL (ref 6–20)
CO2: 24 mmol/L (ref 22–32)
Calcium: 9 mg/dL (ref 8.9–10.3)
Chloride: 91 mmol/L — ABNORMAL LOW (ref 101–111)
Creatinine, Ser: 0.91 mg/dL (ref 0.61–1.24)
GFR calc Af Amer: >60 mL/min (ref >60)
GFR calc non Af Amer: >60 mL/min (ref >60)
Glucose, Bld: 100 mg/dL — ABNORMAL HIGH (ref 65–99)
Potassium: 4.6 mmol/L (ref 3.5–5.1)
Sodium: 124 mmol/L — ABNORMAL LOW (ref 135–145)

## 2018-03-28 LAB — MAGNESIUM: Magnesium: 1.7 mg/dL (ref 1.7–2.4)

## 2018-03-28 LAB — OSMOLALITY, URINE: Osmolality, Ur: 150 mOsm/kg — ABNORMAL LOW (ref 300–900)

## 2018-03-28 LAB — SODIUM, URINE, RANDOM: Sodium, Ur: 22 mmol/L

## 2018-03-28 MED ORDER — ONDANSETRON HCL 8 MG PO TABS
8.0000 mg | ORAL_TABLET | Freq: Three times a day (TID) | ORAL | Status: DC | PRN
Start: 2018-03-28 — End: 2018-03-31

## 2018-03-28 MED ORDER — AMOXICILLIN-POT CLAVULANATE 875-125 MG PO TABS
1.0000 | ORAL_TABLET | Freq: Two times a day (BID) | ORAL | Status: DC
  Administered 2018-03-28 – 2018-03-31 (×6): 1 via ORAL
  Filled 2018-03-28 (×6): qty 1

## 2018-03-28 MED ORDER — SODIUM CHLORIDE 0.9 % IV SOLN
INTRAVENOUS | Status: DC
Start: 2018-03-28 — End: 2018-03-29
  Administered 2018-03-29: 01:00:00 via INTRAVENOUS

## 2018-03-28 MED ORDER — BOOST HIGH PROTEIN PO LIQD
1.0000 | Freq: Two times a day (BID) | ORAL | Status: DC
  Filled 2018-03-28: qty 237

## 2018-03-28 MED ORDER — CHLORHEXIDINE GLUCONATE 0.12 % MT SOLN
15.0000 mL | Freq: Two times a day (BID) | OROMUCOSAL | Status: DC
  Administered 2018-03-29: 15 mL via OROMUCOSAL
  Filled 2018-03-28 (×2): qty 15

## 2018-03-28 MED ORDER — SODIUM CHLORIDE 0.9 % IV BOLUS
1000.0000 mL | Freq: Once | INTRAVENOUS | Status: AC
  Administered 2018-03-28: 1000 mL via INTRAVENOUS

## 2018-03-28 MED ORDER — SODIUM CHLORIDE 0.9 % IV SOLN
INTRAVENOUS | Status: DC
  Administered 2018-03-28: 12:00:00 via INTRAVENOUS

## 2018-03-28 MED ORDER — CYANOCOBALAMIN 500 MCG PO TABS
250.0000 ug | ORAL_TABLET | Freq: Every day | ORAL | Status: DC
  Administered 2018-03-29 – 2018-03-31 (×3): 250 ug via ORAL
  Filled 2018-03-28 (×3): qty 1

## 2018-03-28 MED ORDER — LORATADINE 10 MG PO TABS
10.0000 mg | ORAL_TABLET | Freq: Every day | ORAL | Status: DC
  Administered 2018-03-28 – 2018-03-31 (×4): 10 mg via ORAL
  Filled 2018-03-28 (×4): qty 1

## 2018-03-28 MED ORDER — FLUTICASONE PROPIONATE 50 MCG/ACT NA SUSP
2.0000 | Freq: Every day | NASAL | Status: DC
  Administered 2018-03-29 – 2018-03-31 (×3): 2 via NASAL
  Filled 2018-03-28: qty 16

## 2018-03-28 MED ORDER — AMOXICILLIN 250 MG/5ML PO SUSR: 500.0000 mg | Freq: Three times a day (TID) | ORAL | Status: DC

## 2018-03-28 MED ORDER — ENOXAPARIN SODIUM 40 MG/0.4ML ~~LOC~~ SOLN
40.0000 mg | SUBCUTANEOUS | Status: DC
  Administered 2018-03-28 – 2018-03-30 (×3): 40 mg via SUBCUTANEOUS
  Filled 2018-03-28 (×3): qty 0.4

## 2018-03-28 MED ORDER — AMLODIPINE BESYLATE 5 MG PO TABS
5.0000 mg | ORAL_TABLET | Freq: Every evening | ORAL | Status: DC
  Administered 2018-03-28 – 2018-03-31 (×4): 5 mg via ORAL
  Filled 2018-03-28 (×5): qty 1

## 2018-03-28 NOTE — Progress Notes (Signed)
Patient arrived from [facility].

## 2018-03-28 NOTE — ED Triage Notes (Signed)
Pt had jaw surgery at [facility] on Tuesday. Pt has been vomiting since Wednesday.

## 2018-03-28 NOTE — ED Notes (Signed)
Pt on cardiac monitor and auto VS 

## 2018-03-28 NOTE — ED Notes (Signed)
Report given to JC with [facility].  

## 2018-03-28 NOTE — H&P (Signed)
HPI  Lee Barnes ZOX:[id number] DOB: June 01, 1951 DOA: 03/28/2018  PCP: Ronnald Nian, MD   Chief Complaint:  Feeling weak and dizzy HPI:  66-year Stents of complex dental history in 2012 had oral cancer status post 36 rounds of XRT it was a squamous cell carcinoma at the base of tongue he was treated for 5 years and cured of the same Towards the end of 2018 he had a cavity in the molar which was extracted but this took an extensive period of time (9 months) to heal in January 2019 he had multiple episodes where he had a clicking sensation in his jaw and this culminated in him coming to the emergency room with severe pain- He also states that he was freezing last Tuesday night prior to coming to the emergency room thank you and was not feeling well which is why he was prompted to come to the emergency room on 5/2 He was appropriately treated as aboveat that time he was found to have a sodium 125 and finally got the referral that he felt was necessary to go to Dr. Cherlynn Perches oral maxillofacial surgeon dentist Department  He comes back in with weakness and debility in addition to nausea vomiting but not able to hold down a lot he also started feeling weak orthostatic and felt like he may fall without seizure activity   ED Course: Patient was given sodium chloride 75 cc/h and it was requested to obtain urine osmolality urine sodium  Review of Systems:  Weakness, dizziness, syncopal-like symptoms, lightheadedness Pain in the jaw is improved and he is able to swallow a little better-he has been able to tolerate some soft diet in addition to the main amount of fluids that he is been having Negative for fever, visual changes, sore throat, rash, new muscle aches, chest pain, SOB, dysuria, bleeding, n/v/abdominal pain.  Past Medical History:  Diagnosis Date  . Allergy to environmental factors   . Complication of anesthesia    "vageled" after surgery -overnite stay  . Diverticulosis  2008  . Heart murmur    hx of in childhood   . History of chemotherapy   . History of radiation therapy 11/05/10-12/26/10   r base tongue, 7000 cGy 35 sessions  . Hypertension    Medication started in May 2016.  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 Pneumonia    hx of 2012  . 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 Tinnitus     Past Surgical History:  Procedure Laterality Date  . APPENDECTOMY    . COLONOSCOPY    . INGUINAL HERNIA REPAIR Bilateral 03/30/2015   Procedure: LAPAROSCOPIC BILATERAL INGUINAL HERNIA REPAIR WITH MESH;  Surgeon: Abigail Miyamoto, MD;  Location: [facility];  Service: General;  Laterality: Bilateral;  . INGUINAL HERNIA REPAIR Right 1960  . INGUINAL HERNIA REPAIR Bilateral 03/30/2015  . INGUINAL HERNIA REPAIR Left 07/11/2015  . INGUINAL HERNIA REPAIR N/A 07/11/2015   Procedure: REPAIR OF LEFT INGUINAL HERNIA WITH MESH;  Surgeon: Abigail Miyamoto, MD;  Location[facility];  Service: General;  Laterality: N/A;  . INSERTION OF MESH N/A 07/11/2015   Procedure: INSERTION OF MESH;  Surgeon: Abigail Miyamoto, MD;  Location: [facility];  Service: General;  Laterality: N/A;  . LUMBAR DISC SURGERY    . TONSILLECTOMY       reports that he has never smoked. He has never used smokeless tobacco. He reports that he drinks alcohol. He reports that he does  not use drugs. 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 Codeine Nausea And Vomiting    Family History  Problem Relation Age of Onset  . Heart disease Father   . Cancer Father        throat ca  . Heart disease Mother   . Colon cancer Neg Hx      Prior to Admission medications   Medication Sig Start Date End Date Taking? Authorizing Provider  acetaminophen (TYLENOL) 500 MG tablet Take 500 mg by mouth every 5 hours   Yes [provider]  Amino Acids (AMINO ACID PO) Take 5 tablets by mouth 2 (two)  times daily.   Yes [provider]  amLODipine (NORVASC) 5 MG tablet Take 5 mg by mouth every evening.  03/24/18  Yes [provider]  amoxicillin-clavulanate (AUGMENTIN) 875-125 MG tablet Take 1 tablet by mouth 2 (two) times daily.   Yes [provider]  cetirizine (ZYRTEC) 10 MG tablet Take 10 mg by mouth at bedtime.    Yes [provider]  chlorhexidine (PERIDEX) 0.12 % solution 15 mLs by Mouth Rinse route 2 (two) times daily. 03/25/18  Yes [provider]  Cyanocobalamin (VITAMIN B-12 PO) Take 1 tablet by mouth daily.   Yes [provider]  feeding supplement (BOOST HIGH PROTEIN) LIQD Take 1 Container by mouth 2 (two) times daily between meals.    Yes [provider]  fluticasone (FLONASE) 50 MCG/ACT nasal spray SPRAY 2 SPRAYS INTO EACH NOSTRIL DAILY AS NEEDED Patient taking differently: Instill 2 sprays into each nostril at bedtime 02/26/18  Yes Ronnald Nian, MD  ibuprofen (ADVIL,MOTRIN) 100 MG/5ML suspension Take 10 mLs by mouth every 6 (six) hours as needed for pain. 03/25/18  Yes [provider]  Magnesium Oxide (MAG-OX 400 PO) Take 400 mg by mouth daily.   Yes [provider]  NON FORMULARY "Men's Health" capsules: Take 1 capsule by mouth once time a day   Yes [provider]  ondansetron (ZOFRAN) 8 MG tablet Take 1 tablet (8 mg total) by mouth every 8 (eight) hours as needed for nausea or vomiting. 03/27/18  Yes Ronnald Nian, MD  amoxicillin (AMOXIL) 250 MG/5ML suspension Take 10 mLs (500 mg total) by mouth 3 (three) times daily. Patient not taking: Reported on 03/28/2018 03/11/18   Mesner, Barbara Cower, MD    Physical Exam:  Vitals:   03/28/18 1303 03/28/18 1505  BP:  (!) 172/101  Pulse: 84 85  Resp: 17   Temp:  98.2 F (36.8 C)  SpO2: 99% 99%     EOMI NCAT patient has a well-healed scar under the right jaw about 10 cm long which is healing well he has some brawny type skin color changes to the  face, I cannot palpate any lymph nodes on the right side however on the left side I am able to palpate the submandibular and submental glands  His chest is clinically clear without added sound  S1-S2 no murmur rub or gallop  Abdomen soft he has postop changes to the xiphisternum with a prior PEG tube wound  No lower extremity edema no hepatosplenomegaly  Chest is clinically clear without added sound  Neurologically intact without any deficits at this time able to verbalize well and has no confusion is completely coherent and gives history well  I have personally reviewed following labs and imaging studies  Labs:   Sodium 114 down from 125 BUN/creatinine elevated 22/0.9 over baseline 18.9 AST and ALT 60 and 65 >baseline of 39/4 8-hemoglobin 12.8 MCV 74.2 MCHC 36.5 platelet 364  Imaging studies:   None performed  Medical tests:   EKG independently reviewed: none    Test discussed with performing physician:  Discussed the patient's care with Dr. 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, his urine sodium points towards this as it is 22, his urine allowed he is also significantly diminished to 150 is half of the normal expected-when he was last here back in 2017 it was 392 He tells me that status post op multiple hernia repairs he has had problems with his sodium as well Continue saline repletion at 75 cc an hour and gently restore tonicity-he will need at least every 8 hourly checks to ensure we are going in the right direction-he does not need hypertonic saline or stepdown  management at this time as he is coherent-if he does not resolve over the next 24 hours he may need nephrology input but I do not think that is necessary at this time  Squamous cell carcinoma status post resection in addition to recent repair 4 to 5 days prior to admission may be on 03/22/2018-I do not have any records to confirm this    Severity of Illness: The appropriate patient status for this patient is INPATIENT. Inpatient status is judged to be reasonable and necessary in order to provide the required intensity of service to ensure the patient's safety. The patient's presenting symptoms, physical exam findings, and initial radiographic and laboratory data in the context of their chronic comorbidities is felt to place them at high risk for further clinical deterioration. Furthermore, it is not anticipated that the patient will be medically stable for discharge from the hospital within 2 midnights of admission. The following factors support the patient status of inpatient.   " The patient's presenting symptoms include dizziness lightheadedness. " The worrisome physical exam findings include dehydration. " The initial radiographic and laboratory data are worrisome because of hyponatremia. " The chronic co-morbidities include cancer.   * I certify that at the point of admission it is my clinical judgment that the patient will require inpatient hospital care spanning beyond 2 midnights from the point of admission due to high intensity of service, high risk for further deterioration and high frequency of surveillance required.*     DVT prophylaxis: Lovenox Code Status: Full code Family Communication: None Consults called: Inpatient  Time spent: 50 minutes  Natsha Guidry, MD  [facility] Direct contact: [phone] --Via amion app OR  --[email or web address]; password [email or web address]  7PM-7AM contact night coverage as above  03/28/2018, 3:25 PM

## 2018-03-28 NOTE — ED Provider Notes (Addendum)
[facility] Provider Note   CSN: [id number] Arrival date & time: 03/28/18  0957     History   Chief Complaint Chief Complaint  Patient presents with  . Emesis    HPI Lee Barnes is a 67 y.o. male.  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  Patient went home on Wednesday and had vomiting on Wednesday and Thursday and was still taking small sips of fluids but was not able to hold a lot down.  Yesterday he had no further nausea or vomiting and was able to drink but has felt continually weak.  Today he was very weak with standing and feeling lightheaded.  Wife stated it looked like he may fall.  He has had no type of seizure-like activity and is otherwise been acting his normal self.  He does not have significant pain or swelling where the surgical site is.  He has had no fevers.  He denies any shortness of breath.  The history is provided by the patient.  Emesis   This is a new problem. The current episode started 2 days ago. Episode frequency: resolved on thursday.  no vomiting since thursday. Progression since onset: weakness has worsened and really dizzy with standing this morning. There has been no fever. Associated symptoms include chills. Pertinent negatives include no abdominal pain, no cough, no diarrhea and no URI.    Past Medical History:  Diagnosis Date  . Allergy to environmental factors   . Complication of anesthesia    "vageled" after surgery -overnite stay  . Diverticulosis 2008  . Heart murmur    hx of in childhood   . History of chemotherapy   . History of  radiation therapy 11/05/10-12/26/10   r base tongue, 7000 cGy 35 sessions  . Hypertension    Medication started in May 2016.  Marland Kitchen Oropharynx cancer ([place]) 09/2010  . Pneumonia    hx of 2012  . 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 Tinnitus     Patient Active Problem List   Diagnosis Date Noted  . 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 ACE-inhibitor cough 05/25/2015  . Essential hypertension 03/21/2015  . Allergic rhinitis 12/14/2013  . ED (erectile dysfunction) 12/14/2013  . Diverticulosis   . History of oropharyngeal cancer 09/18/2010    Past Surgical History:  Procedure Laterality Date  . APPENDECTOMY    . COLONOSCOPY    . INGUINAL HERNIA REPAIR Bilateral 03/30/2015   Procedure: LAPAROSCOPIC BILATERAL INGUINAL HERNIA REPAIR WITH MESH;  Surgeon: Coralie Keens, MD;  Location: [facility];  Service: General;  Laterality: Bilateral;  . INGUINAL HERNIA REPAIR Right 1960  . INGUINAL HERNIA REPAIR Bilateral 03/30/2015  . INGUINAL HERNIA REPAIR Left 07/11/2015  . INGUINAL HERNIA REPAIR N/A 07/11/2015   Procedure: REPAIR OF LEFT INGUINAL HERNIA WITH MESH;  Surgeon: Coralie Keens, MD;  Location: [place];  Service: General;  Laterality: N/A;  . INSERTION OF MESH N/A 07/11/2015   Procedure: INSERTION OF MESH;  Surgeon: Coralie Keens, MD;  Location: [place];  Service: General;  Laterality: N/A;  . LUMBAR [place]    .  TONSILLECTOMY          Home Medications    Prior to Admission medications   Medication Sig Start Date End Date Taking? Authorizing Provider  acetaminophen (TYLENOL) 500 MG tablet Take 500 mg by mouth every 5 hours    [provider]  Amino Acids (AMINO ACID PO) Take 5 tablets by mouth 2 (two) times daily.    [provider]  amoxicillin (AMOXIL) 250 MG/5ML suspension Take 10 mLs (500 mg total) by mouth 3 (three) times daily. 03/11/18   Mesner, Corene Cornea, MD  amoxicillin-clavulanate (AUGMENTIN) 875-125 MG tablet Take 1 tablet by mouth 2 (two)  times daily.    [provider]  cetirizine (ZYRTEC) 10 MG tablet Take 10 mg by mouth at bedtime.     [provider]  Cyanocobalamin (VITAMIN B-12 PO) Take 1 tablet by mouth daily.    [provider]  feeding supplement (BOOST HIGH PROTEIN) LIQD Take 1 Container by mouth 3 (three) times daily between meals.    [provider]  fluticasone (FLONASE) 50 MCG/ACT nasal spray SPRAY 2 SPRAYS INTO EACH NOSTRIL DAILY AS NEEDED Patient taking differently: Instill 2 sprays into each nostril at bedtime 02/26/18   Denita Lung, MD  Magnesium Oxide (MAG-OX 400 PO) Take 400 mg by mouth daily.    [provider]  NON FORMULARY "Men's Health" capsules: Take 1 capsule by mouth once time a day    [provider]  ondansetron (ZOFRAN) 8 MG tablet Take 1 tablet (8 mg total) by mouth every 8 (eight) hours as needed for nausea or vomiting. 03/27/18   Denita Lung, MD    Family History Family History  Problem Relation Age of Onset  . Heart disease Father   . Cancer Father        throat ca  . Heart disease Mother   . Colon cancer Neg Hx     Social History Social History   Tobacco Use  . Smoking status: Never Smoker  . Smokeless tobacco: Never Used  Substance Use Topics  . Alcohol use: Yes    Comment: occas drinks a beer every few weeks   . Drug use: No    Comment:       Allergies   Codeine   Review of Systems Review of Systems  Constitutional: Positive for chills.  Respiratory: Negative for cough.   Gastrointestinal: Positive for vomiting. Negative for abdominal pain and diarrhea.  All other systems reviewed and are negative.    Physical Exam Updated Vital Signs BP (!) 164/98   Pulse 76   Temp 97.6 F (36.4 C) (Oral)   Resp 12   Ht 6' (1.829 m)   Wt 71.2 kg (157 lb)   SpO2 97%   BMI 21.29 kg/m   Physical Exam  Constitutional: He is oriented to person, place, and time. He appears well-developed and well-nourished. No  distress.  HENT:  Head: Normocephalic and atraumatic.    Mouth/Throat: Oropharynx is clear and moist. No uvula swelling.    Eyes: Pupils are equal, round, and reactive to light. Conjunctivae and EOM are normal.  Neck: Normal range of motion. Neck supple.  Cardiovascular: Normal rate, regular rhythm and intact distal pulses.  No murmur heard. Pulmonary/Chest: Effort normal and breath sounds normal. No respiratory distress. He has no wheezes. He has no rales.  Abdominal: Soft. He exhibits no distension. There is no tenderness. There is no rebound and no guarding.  Musculoskeletal: Normal range of motion. He  exhibits no edema or tenderness.  Neurological: He is alert and oriented to person, place, and time.  Skin: Skin is warm and dry. No rash noted. No erythema.  Psychiatric: He has a normal mood and affect. His behavior is normal.  Nursing note and vitals reviewed.    ED Treatments / Results  Labs (all labs ordered are listed, but only abnormal results are displayed) Labs Reviewed  CBC WITH DIFFERENTIAL/PLATELET - Abnormal; Notable for the following components:      Result Value   Hemoglobin 12.8 (*)    HCT 35.1 (*)    MCV 74.2 (*)    MCHC 36.5 (*)    Lymphs Abs 0.5 (*)    Monocytes Absolute 1.2 (*)    All other components within normal limits  COMPREHENSIVE METABOLIC PANEL - Abnormal; Notable for the following components:   Sodium 114 (*)    Chloride 81 (*)    CO2 21 (*)    BUN 22 (*)    AST 60 (*)    ALT 65 (*)    All other components within normal limits  MAGNESIUM    EKG None  Radiology No results found.  Procedures Procedures (including critical care time)  Medications Ordered in ED Medications  sodium chloride 0.9 % bolus 1,000 mL (has no administration in time range)  sodium chloride 0.9 % bolus 1,000 mL (0 mLs Intravenous Stopped 03/28/18 1116)     Initial Impression / Assessment and Plan / ED Course  I have reviewed the triage vital signs and the  nursing notes.  Pertinent labs & imaging results that were available during my care of the patient were reviewed by me and considered in my medical decision making (see chart for details).    Patient presenting with a long history of issues with his right jaw recently had surgery on Tuesday presenting for concern for dehydration.  Patient had significant nausea and vomiting on Wednesday and Thursday which is now resolved but he continues to feel weak.  Prior to surgery on Tuesday patient has been on a liquid diet since January.  He was seen in the emergency [facility] on May 2 and at that time patient was noted to be dehydrated and sodium level was 125.  Since he has been on a liquid diet since January sodium levels have ranged between 1 20-1 27.  However today patient's sodium is 114.  He is receiving IV fluids at this time.  Potassium, magnesium and calcium without acute findings.  However given his hyponatremia feel he would benefit from admission.  Patient is not currently on any medications that would cause hyponatremia.   CRITICAL CARE Performed by: Quintan Saldivar Total critical care time: 30 minutes Critical care time was exclusive of separately billable procedures and treating other patients. Critical care was necessary to treat or prevent imminent or life-threatening deterioration. Critical care was time spent personally by me on the following activities: development of treatment plan with patient and/or surrogate as well as nursing, discussions with consultants, evaluation of patient's response to treatment, examination of patient, obtaining history from patient or surrogate, ordering and performing treatments and interventions, ordering and review of laboratory studies, ordering and review of radiographic studies, pulse oximetry and re-evaluation of patient's condition.  Final Clinical Impressions(s) / ED Diagnoses   Final diagnoses:  Hyponatremia  Dehydration    ED Discharge Orders     None       Blanchie Dessert, MD 03/28/18 1204    Blanchie Dessert, MD  03/28/18 1205  

## 2018-03-28 NOTE — ED Notes (Signed)
Date and time results received: 03/28/18 1104 Test: Sodium Critical Value: 114 Name of Provider Notified:Plunkett Orders Received? Or Actions Taken?: no orders given

## 2018-03-28 NOTE — ED Notes (Signed)
[facility] arrived to transport pt 

## 2018-03-28 NOTE — ED Notes (Signed)
Given soup and Gatorade , wife and friends at bedside

## 2018-03-29 LAB — BASIC METABOLIC PANEL
Anion gap: 9 (ref 5–15)
Anion gap: 10 (ref 5–15)
Anion gap: 12 (ref 5–15)
Anion gap: 11 (ref 5–15)
BUN: 17 mg/dL (ref 6–20)
BUN: 15 mg/dL (ref 6–20)
BUN: 16 mg/dL (ref 6–20)
BUN: 18 mg/dL (ref 6–20)
CO2: 23 mmol/L (ref 22–32)
CO2: 26 mmol/L (ref 22–32)
CO2: 24 mmol/L (ref 22–32)
CO2: 22 mmol/L (ref 22–32)
Calcium: 8.8 mg/dL — ABNORMAL LOW (ref 8.9–10.3)
Calcium: 9.2 mg/dL (ref 8.9–10.3)
Calcium: 9.2 mg/dL (ref 8.9–10.3)
Calcium: 9.1 mg/dL (ref 8.9–10.3)
Chloride: 93 mmol/L — ABNORMAL LOW (ref 101–111)
Chloride: 89 mmol/L — ABNORMAL LOW (ref 101–111)
Chloride: 87 mmol/L — ABNORMAL LOW (ref 101–111)
Chloride: 89 mmol/L — ABNORMAL LOW (ref 101–111)
Creatinine, Ser: 0.82 mg/dL (ref 0.61–1.24)
Creatinine, Ser: 0.82 mg/dL (ref 0.61–1.24)
Creatinine, Ser: 0.77 mg/dL (ref 0.61–1.24)
Creatinine, Ser: 0.7 mg/dL (ref 0.61–1.24)
GFR calc Af Amer: >60 mL/min (ref >60)
GFR calc Af Amer: >60 mL/min (ref >60)
GFR calc Af Amer: >60 mL/min (ref >60)
GFR calc Af Amer: >60 mL/min (ref >60)
GFR calc non Af Amer: >60 mL/min (ref >60)
GFR calc non Af Amer: >60 mL/min (ref >60)
GFR calc non Af Amer: >60 mL/min (ref >60)
GFR calc non Af Amer: >60 mL/min (ref >60)
Glucose, Bld: 90 mg/dL (ref 65–99)
Glucose, Bld: 119 mg/dL — ABNORMAL HIGH (ref 65–99)
Glucose, Bld: 104 mg/dL — ABNORMAL HIGH (ref 65–99)
Glucose, Bld: 97 mg/dL (ref 65–99)
Potassium: 4.4 mmol/L (ref 3.5–5.1)
Potassium: 4.7 mmol/L (ref 3.5–5.1)
Potassium: 4.3 mmol/L (ref 3.5–5.1)
Potassium: 4.2 mmol/L (ref 3.5–5.1)
Sodium: 125 mmol/L — ABNORMAL LOW (ref 135–145)
Sodium: 125 mmol/L — ABNORMAL LOW (ref 135–145)
Sodium: 123 mmol/L — ABNORMAL LOW (ref 135–145)
Sodium: 122 mmol/L — ABNORMAL LOW (ref 135–145)

## 2018-03-29 LAB — CBC
HCT: 30.4 % — ABNORMAL LOW (ref 39.0–52.0)
Hemoglobin: 10.2 g/dL — ABNORMAL LOW (ref 13.0–17.0)
MCH: 26 pg (ref 26.0–34.0)
MCHC: 33.6 g/dL (ref 30.0–36.0)
MCV: 77.4 fL — ABNORMAL LOW (ref 78.0–100.0)
Platelets: 298 10*3/uL (ref 150–400)
RBC: 3.93 MIL/uL — ABNORMAL LOW (ref 4.22–5.81)
RDW: 15.5 % (ref 11.5–15.5)
WBC: 6 10*3/uL (ref 4.0–10.5)

## 2018-03-29 LAB — PROTIME-INR
INR: 1.01
Prothrombin Time: 13.2 seconds (ref 11.4–15.2)

## 2018-03-29 MED ORDER — DEXTROSE 5 % IV SOLN
INTRAVENOUS | Status: DC
  Administered 2018-03-29: 07:00:00 via INTRAVENOUS

## 2018-03-29 MED ORDER — POLYETHYLENE GLYCOL 3350 17 G PO PACK
17.0000 g | PACK | Freq: Every day | ORAL | Status: DC
  Administered 2018-03-29 – 2018-03-30 (×2): 17 g via ORAL
  Filled 2018-03-29 (×3): qty 1

## 2018-03-29 MED ORDER — BOOST PLUS PO LIQD
1.0000 | Freq: Two times a day (BID) | ORAL | Status: DC
  Administered 2018-03-29 – 2018-03-31 (×3): 237 mL via ORAL
  Filled 2018-03-29 (×6): qty 237

## 2018-03-29 NOTE — Progress Notes (Signed)
[facility] PROGRESS NOTE    Progress Note  Lee [facility]  XBJ:[id number] DOB: 10/24/1951 DOA: 03/28/2018 PCP: Lee Nian, MD     Brief Narrative:   Lee Barnes is an 67 y.o. male past medical history complex dental history status post oral cancer in remission who comes in as he was not feeling well the day prior in the ED was found to have a sodium of 114.  Assessment/Plan:   Severe hyponatremia: With a sodium of 114 and basically asymptomatic which point source of chronic condition. Urinary sodium was 22, with a urine osmolarity of 150.  Prostatic not check on admission Relates orthostasis on the day prior to admission with severe lightheadedness and he also relates he has had significant decrease oral intake. Sodium is correcting too fast start him on D5 continue check Bumex every 4.  History of oropharyngeal cancer PaIn control.    DVT prophylaxis: lovenox Family Communication:none Disposition Plan/Barrier to D/C: home inam Code Status:     Code Status Orders  (From admission, onward)        Start     Ordered   03/28/18 1552  Full code  Continuous     03/28/18 1553    Code Status History    Date Active Date Inactive Code Status Order ID Comments User Context   07/11/2015 1607 07/12/2015 1437 Full Code [id number]  Lee Miyamoto, MD Inpatient   04/02/2015 0052 04/05/2015 1437 Full Code [id number]  Lee Doyne, MD Inpatient   03/30/2015 1827 03/31/2015 1727 Full Code [id number]  Lee Level, MD Inpatient        IV Access:    Peripheral IV   Procedures and diagnostic studies:   No results found.   Medical Consultants:    None.  Anti-Infectives:   None  Subjective:    Lee Barnes no new complaints feels great lightheadedness resolved.  Objective:    Vitals:   03/28/18 1303 03/28/18 1505 03/28/18 1941 03/29/18 0420  BP:  (!) 172/101 (!) 146/91 (!) 142/91  Pulse: 84 85 86 79  Resp: 17  20 16   Temp:  98.2 F  (36.8 C) 98 F (36.7 C) 98.6 F (37 C)  TempSrc:  Oral Oral Oral  SpO2: 99% 99% 99% 98%  Weight:      Height:        Intake/Output Summary (Last 24 hours) at 03/29/2018 0849 Last data filed at 03/29/2018 0422 Gross per 24 hour  Intake 3042.5 ml  Output -  Net 3042.5 ml   Filed Weights   03/28/18 1001  Weight: 71.2 kg (157 lb)    Exam: General exam: In no acute distress. Respiratory system: Good air movement and clear to auscultation. Cardiovascular system: S1 & S2 heard, RRR.  Gastrointestinal system: Abdomen is nondistended, soft and nontender.  Central nervous system: Alert and oriented. No focal neurological deficits. Extremities: No pedal edema. Skin: No rashes, lesions or ulcers Psychiatry: Judgement and insight appear normal. Mood & affect appropriate.    Data Reviewed:    Labs: Basic Metabolic Panel: Recent Labs  Lab 03/28/18 1022 03/28/18 1619 03/29/18 0348  NA 114* 124* 125*  K 4.5 4.6 4.4  CL 81* 91* 93*  CO2 21* 24 23  GLUCOSE 97 100* 90  BUN 22* 18 17  CREATININE 0.92 0.91 0.82  CALCIUM 9.0 9.0 8.8*  MG 1.7  --   --    GFR Estimated Creatinine Clearance: 89.2 mL/min (by C-G formula based on SCr of 0.82  mg/dL). Liver Function Tests: Recent Labs  Lab 03/28/18 1022  AST 60*  ALT 65*  ALKPHOS 81  BILITOT 0.9  PROT 7.5  ALBUMIN 3.7   No results for input(s): LIPASE, AMYLASE in the last 168 hours. No results for input(s): AMMONIA in the last 168 hours. Coagulation profile Recent Labs  Lab 03/29/18 0348  INR 1.01    CBC: Recent Labs  Lab 03/28/18 1022 03/28/18 1619 03/29/18 0348  WBC 7.2 6.3 6.0  NEUTROABS 5.5  --   --   HGB 12.8* 11.4* 10.2*  HCT 35.1* 32.8* 30.4*  MCV 74.2* 77.5* 77.4*  PLT 364 319 298   Cardiac Enzymes: No results for input(s): CKTOTAL, CKMB, CKMBINDEX, TROPONINI in the last 168 hours. BNP (last 3 results) No results for input(s): PROBNP in the last 8760 hours. CBG: No results for input(s): GLUCAP in  the last 168 hours. D-Dimer: No results for input(s): DDIMER in the last 72 hours. Hgb A1c: No results for input(s): HGBA1C in the last 72 hours. Lipid Profile: No results for input(s): CHOL, HDL, LDLCALC, TRIG, CHOLHDL, LDLDIRECT in the last 72 hours. Thyroid function studies: No results for input(s): TSH, T4TOTAL, T3FREE, THYROIDAB in the last 72 hours.  Invalid input(s): FREET3 Anemia work up: No results for input(s): VITAMINB12, FOLATE, FERRITIN, TIBC, IRON, RETICCTPCT in the last 72 hours. Sepsis Labs: Recent Labs  Lab 03/28/18 1022 03/28/18 1619 03/29/18 0348  WBC 7.2 6.3 6.0   Microbiology No results found for this or any previous visit (from the past 240 hour(s)).   Medications:   . amLODipine  5 mg Oral QPM  . amoxicillin-clavulanate  1 tablet Oral BID  . chlorhexidine  15 mL Mouth Rinse BID  . enoxaparin (LOVENOX) injection  40 mg Subcutaneous Q24H  . fluticasone  2 spray Each Nare Daily  . lactose free nutrition  1 Container Oral BID BM  . loratadine  10 mg Oral Daily  . vitamin B-12  250 mcg Oral Daily   Continuous Infusions: . dextrose 50 mL/hr at 03/29/18 0729      LOS: 1 day   Lee [facility]  [facility] Pager [phone]  *Please refer to [email or web address], password [email or web address] to get updated schedule on who will round on this patient, as hospitalists switch teams weekly. If 7PM-7AM, please contact night-coverage at [email or web address], password [email or web address] for any overnight needs.  03/29/2018, 8:49 AM

## 2018-03-30 LAB — BASIC METABOLIC PANEL
Anion gap: 10 (ref 5–15)
Anion gap: 10 (ref 5–15)
Anion gap: 12 (ref 5–15)
Anion gap: 12 (ref 5–15)
Anion gap: 10 (ref 5–15)
BUN: 16 mg/dL (ref 6–20)
BUN: 14 mg/dL (ref 6–20)
BUN: 16 mg/dL (ref 6–20)
BUN: 14 mg/dL (ref 6–20)
BUN: 12 mg/dL (ref 6–20)
CO2: 20 mmol/L — ABNORMAL LOW (ref 22–32)
CO2: 26 mmol/L (ref 22–32)
CO2: 26 mmol/L (ref 22–32)
CO2: 25 mmol/L (ref 22–32)
CO2: 24 mmol/L (ref 22–32)
Calcium: 9 mg/dL (ref 8.9–10.3)
Calcium: 9.2 mg/dL (ref 8.9–10.3)
Calcium: 9.5 mg/dL (ref 8.9–10.3)
Calcium: 9.4 mg/dL (ref 8.9–10.3)
Calcium: 9 mg/dL (ref 8.9–10.3)
Chloride: 92 mmol/L — ABNORMAL LOW (ref 101–111)
Chloride: 90 mmol/L — ABNORMAL LOW (ref 101–111)
Chloride: 88 mmol/L — ABNORMAL LOW (ref 101–111)
Chloride: 92 mmol/L — ABNORMAL LOW (ref 101–111)
Chloride: 92 mmol/L — ABNORMAL LOW (ref 101–111)
Creatinine, Ser: 0.83 mg/dL (ref 0.61–1.24)
Creatinine, Ser: 0.8 mg/dL (ref 0.61–1.24)
Creatinine, Ser: 0.77 mg/dL (ref 0.61–1.24)
Creatinine, Ser: 0.82 mg/dL (ref 0.61–1.24)
Creatinine, Ser: 0.78 mg/dL (ref 0.61–1.24)
GFR calc Af Amer: >60 mL/min (ref >60)
GFR calc Af Amer: >60 mL/min (ref >60)
GFR calc Af Amer: >60 mL/min (ref >60)
GFR calc Af Amer: >60 mL/min (ref >60)
GFR calc Af Amer: >60 mL/min (ref >60)
GFR calc non Af Amer: >60 mL/min (ref >60)
GFR calc non Af Amer: >60 mL/min (ref >60)
GFR calc non Af Amer: >60 mL/min (ref >60)
GFR calc non Af Amer: >60 mL/min (ref >60)
GFR calc non Af Amer: >60 mL/min (ref >60)
Glucose, Bld: 103 mg/dL — ABNORMAL HIGH (ref 65–99)
Glucose, Bld: 88 mg/dL (ref 65–99)
Glucose, Bld: 102 mg/dL — ABNORMAL HIGH (ref 65–99)
Glucose, Bld: 103 mg/dL — ABNORMAL HIGH (ref 65–99)
Glucose, Bld: 135 mg/dL — ABNORMAL HIGH (ref 65–99)
Potassium: 4.3 mmol/L (ref 3.5–5.1)
Potassium: 3.9 mmol/L (ref 3.5–5.1)
Potassium: 4.2 mmol/L (ref 3.5–5.1)
Potassium: 4.2 mmol/L (ref 3.5–5.1)
Potassium: 4.1 mmol/L (ref 3.5–5.1)
Sodium: 122 mmol/L — ABNORMAL LOW (ref 135–145)
Sodium: 126 mmol/L — ABNORMAL LOW (ref 135–145)
Sodium: 126 mmol/L — ABNORMAL LOW (ref 135–145)
Sodium: 129 mmol/L — ABNORMAL LOW (ref 135–145)
Sodium: 126 mmol/L — ABNORMAL LOW (ref 135–145)

## 2018-03-30 MED ORDER — PANTOPRAZOLE SODIUM 40 MG PO TBEC: 40.0000 mg | DELAYED_RELEASE_TABLET | Freq: Two times a day (BID) | ORAL | Status: DC

## 2018-03-30 MED ORDER — SODIUM CHLORIDE 0.9 % IV SOLN
INTRAVENOUS | Status: AC
  Administered 2018-03-30 (×2): via INTRAVENOUS

## 2018-03-30 NOTE — Progress Notes (Signed)
[facility] PROGRESS NOTE    Progress Note  Lee [facility]  [id number]:[id number] DOB: Oct 21, 1951 DOA: 03/28/2018 PCP: Ronnald Nian, MD     Brief Narrative:   Lee Barnes is an 67 y.o. male past medical history complex dental history status post oral cancer in remission who comes in as he was not feeling well the day prior in the ED was found to have a sodium of 114.  Assessment/Plan:   Severe hyponatremia: Sodium has slowed down we will switch him back to normal saline I will encourage him to continue to drink as much fluid as he can, continue basic metabolic checks every 4 hours.  History of oropharyngeal cancer PaIn control.    DVT prophylaxis: lovenox Family Communication:none Disposition Plan/Barrier to D/C: home inam Code Status:     Code Status Orders  (From admission, onward)        Start     Ordered   03/28/18 1552  Full code  Continuous     03/28/18 1553    Code Status History    Date Active Date Inactive Code Status Order ID Comments User Context   07/11/2015 1607 07/12/2015 1437 Full Code [id number]  Abigail Miyamoto, MD Inpatient   04/02/2015 0052 04/05/2015 1437 Full Code [id number]  Therisa Doyne, MD Inpatient   03/30/2015 1827 03/31/2015 1727 Full Code [id number]  Darnell Level, MD Inpatient        IV Access:    Peripheral IV   Procedures and diagnostic studies:   No results found.   Medical Consultants:    None.  Anti-Infectives:   None  Subjective:    [name] no new complaints.  Objective:    Vitals:   03/29/18 0420 03/29/18 1510 03/29/18 2022 03/30/18 0521  BP: (!) 142/91 (!) 141/82 110/74 128/85  Pulse: 79 84 95 86  Resp: 16  20 18   Temp: 98.6 F (37 C) 98.4 F (36.9 C) 97.9 F (36.6 C) 98.3 F (36.8 C)  TempSrc: Oral Oral Oral Oral  SpO2: 98% 98% 97% 97%  Weight:      Height:        Intake/Output Summary (Last 24 hours) at 03/30/2018 0918 Last data filed at 03/29/2018 2130 Gross per 24  hour  Intake 660 ml  Output -  Net 660 ml   Filed Weights   03/28/18 1001  Weight: 71.2 kg (157 lb)    Exam: General exam: In no acute distress. Respiratory system: Good air movement and clear to auscultation. Cardiovascular system: S1 & S2 heard, RRR.  Gastrointestinal system: Abdomen is nondistended, soft and nontender.  Central nervous system: Alert and oriented. No focal neurological deficits. Extremities: No pedal edema. Skin: No rashes, lesions or ulcers Psychiatry: Judgement and insight appear normal. Mood & affect appropriate.    Data Reviewed:    Labs: Basic Metabolic Panel: Recent Labs  Lab 03/28/18 1022  03/29/18 0348 03/29/18 1422 03/29/18 1841 03/29/18 2238 03/30/18 0207  NA 114*   < > 125* 125* 123* 122* 122*  K 4.5   < > 4.4 4.7 4.3 4.2 4.3  CL 81*   < > 93* 89* 87* 89* 92*  CO2 21*   < > 23 26 24 22  20*  GLUCOSE 97   < > 90 119* 104* 97 103*  BUN 22*   < > 17 15 16 18 16   CREATININE 0.92   < > 0.82 0.82 0.77 0.70 0.83  CALCIUM 9.0   < > 8.8* 9.2  9.2 9.1 9.0  MG 1.7  --   --   --   --   --   --    < > = values in this interval not displayed.   GFR Estimated Creatinine Clearance: 88.2 mL/min (by C-G formula based on SCr of 0.83 mg/dL). Liver Function Tests: Recent Labs  Lab 03/28/18 1022  AST 60*  ALT 65*  ALKPHOS 81  BILITOT 0.9  PROT 7.5  ALBUMIN 3.7   No results for input(s): LIPASE, AMYLASE in the last 168 hours. No results for input(s): AMMONIA in the last 168 hours. Coagulation profile Recent Labs  Lab 03/29/18 0348  INR 1.01    CBC: Recent Labs  Lab 03/28/18 1022 03/28/18 1619 03/29/18 0348  WBC 7.2 6.3 6.0  NEUTROABS 5.5  --   --   HGB 12.8* 11.4* 10.2*  HCT 35.1* 32.8* 30.4*  MCV 74.2* 77.5* 77.4*  PLT 364 319 298   Cardiac Enzymes: No results for input(s): CKTOTAL, CKMB, CKMBINDEX, TROPONINI in the last 168 hours. BNP (last 3 results) No results for input(s): PROBNP in the last 8760 hours. CBG: No results for  input(s): GLUCAP in the last 168 hours. D-Dimer: No results for input(s): DDIMER in the last 72 hours. Hgb A1c: No results for input(s): HGBA1C in the last 72 hours. Lipid Profile: No results for input(s): CHOL, HDL, LDLCALC, TRIG, CHOLHDL, LDLDIRECT in the last 72 hours. Thyroid function studies: No results for input(s): TSH, T4TOTAL, T3FREE, THYROIDAB in the last 72 hours.  Invalid input(s): FREET3 Anemia work up: No results for input(s): VITAMINB12, FOLATE, FERRITIN, TIBC, IRON, RETICCTPCT in the last 72 hours. Sepsis Labs: Recent Labs  Lab 03/28/18 1022 03/28/18 1619 03/29/18 0348  WBC 7.2 6.3 6.0   Microbiology No results found for this or any previous visit (from the past 240 hour(s)).   Medications:   . amLODipine  5 mg Oral QPM  . amoxicillin-clavulanate  1 tablet Oral BID  . enoxaparin (LOVENOX) injection  40 mg Subcutaneous Q24H  . fluticasone  2 spray Each Nare Daily  . lactose free nutrition  1 Container Oral BID BM  . loratadine  10 mg Oral Daily  . polyethylene glycol  17 g Oral Daily  . vitamin B-12  250 mcg Oral Daily   Continuous Infusions: . sodium chloride    . dextrose 50 mL/hr at 03/29/18 1727      LOS: 2 days   Marinda Elk  [facility] Pager [phone]  *Please refer to [email or web address], password [email or web address] to get updated schedule on who will round on this patient, as hospitalists switch teams weekly. If 7PM-7AM, please contact night-coverage at [email or web address], password [email or web address] for any overnight needs.  03/30/2018, 9:18 AM

## 2018-03-31 DIAGNOSIS — Z85819 Personal history of malignant neoplasm of unspecified site of lip, oral cavity, and pharynx: Secondary | ICD-10-CM

## 2018-03-31 DIAGNOSIS — E440 Moderate protein-calorie malnutrition: Secondary | ICD-10-CM

## 2018-03-31 LAB — BASIC METABOLIC PANEL
Anion gap: 10 (ref 5–15)
Anion gap: 10 (ref 5–15)
Anion gap: 11 (ref 5–15)
BUN: 10 mg/dL (ref 6–20)
BUN: 9 mg/dL (ref 6–20)
BUN: 11 mg/dL (ref 6–20)
CO2: 22 mmol/L (ref 22–32)
CO2: 26 mmol/L (ref 22–32)
CO2: 27 mmol/L (ref 22–32)
Calcium: 9.1 mg/dL (ref 8.9–10.3)
Calcium: 9.3 mg/dL (ref 8.9–10.3)
Calcium: 9.3 mg/dL (ref 8.9–10.3)
Chloride: 95 mmol/L — ABNORMAL LOW (ref 101–111)
Chloride: 94 mmol/L — ABNORMAL LOW (ref 101–111)
Chloride: 92 mmol/L — ABNORMAL LOW (ref 101–111)
Creatinine, Ser: 0.68 mg/dL (ref 0.61–1.24)
Creatinine, Ser: 0.78 mg/dL (ref 0.61–1.24)
Creatinine, Ser: 0.7 mg/dL (ref 0.61–1.24)
GFR calc Af Amer: >60 mL/min (ref >60)
GFR calc Af Amer: >60 mL/min (ref >60)
GFR calc Af Amer: >60 mL/min (ref >60)
GFR calc non Af Amer: >60 mL/min (ref >60)
GFR calc non Af Amer: >60 mL/min (ref >60)
GFR calc non Af Amer: >60 mL/min (ref >60)
Glucose, Bld: 101 mg/dL — ABNORMAL HIGH (ref 65–99)
Glucose, Bld: 93 mg/dL (ref 65–99)
Glucose, Bld: 102 mg/dL — ABNORMAL HIGH (ref 65–99)
Potassium: 4.3 mmol/L (ref 3.5–5.1)
Potassium: 4.7 mmol/L (ref 3.5–5.1)
Potassium: 4.4 mmol/L (ref 3.5–5.1)
Sodium: 127 mmol/L — ABNORMAL LOW (ref 135–145)
Sodium: 130 mmol/L — ABNORMAL LOW (ref 135–145)
Sodium: 130 mmol/L — ABNORMAL LOW (ref 135–145)

## 2018-03-31 NOTE — Progress Notes (Addendum)
BP 183/98 HR 89 patient is asymptomatic. Dr Venetia Constable paged to make aware.

## 2018-03-31 NOTE — Progress Notes (Signed)
Initial Nutrition Assessment  DOCUMENTATION CODES:   Non-severe (moderate) malnutrition in context of chronic illness  INTERVENTION:   Provided diet education for high calorie, high protein diet. Continue Boost Plus chocolate- Each supplement provides 360kcal and 14g protein.    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  GOAL:   Patient will meet greater than or equal to 90% of their needs  MONITOR:   PO intake, Supplement acceptance  REASON FOR ASSESSMENT:   Consult Assessment of nutrition requirement/status  ASSESSMENT:   67 y.o. male past medical history complex dental history status post oral cancer in remission who comes in as he was not feeling well the day prior in the ED was found to have a sodium of 114.  Patient in room with wife at bedside. Pt reports good appetite but he struggles with chewing d/t his recent jaw surgery. Pt c/o getting full quickly as well.  Reviewed strategies with pt and wife to increase calories and protein without increasing volume of foods. Advised blending foods and maximizing calories and fat such as adding avocado to smoothies and protein powders.  Pt to continue Boost supplements as well.   Per chart review, pt has lost 24 lb since September 2018, insignificant for time frame.   Medications: Miralax packet daily, Vitamin B-12 tablet daily Labs reviewed: Low Na   NUTRITION - FOCUSED PHYSICAL EXAM:    Most Recent Value  Orbital Region  No depletion  Upper Arm Region  Moderate depletion  Thoracic and Lumbar Region  Unable to assess  Buccal Region  Moderate depletion  Temple Region  Moderate depletion  Clavicle Bone Region  Moderate depletion  Clavicle and Acromion Bone Region  Moderate depletion  Scapular Bone Region  Unable to assess   Dorsal Hand  Mild depletion  Patellar Region  Unable to assess  Anterior Thigh Region  Unable to assess  Posterior Calf Region  Unable to assess  Edema (RD Assessment)  None       Diet Order:   Diet Order           Diet - low sodium heart healthy        DIET SOFT Room service appropriate? Yes; Fluid consistency: Thin  Diet effective now          EDUCATION NEEDS:   Education needs have been addressed  Skin:  Skin Assessment: Skin Integrity Issues: Skin Integrity Issues:: Incisions Incisions: jaw  Last BM:  5/5  Height:   Ht Readings from Last 1 Encounters:  03/28/18 6' (1.829 m)    Weight:   Wt Readings from Last 1 Encounters:  03/28/18 157 lb (71.2 kg)    Ideal Body Weight:  80.9 kg  BMI:  Body mass index is 21.29 kg/m.  Estimated Nutritional Needs:   Kcal:  2100-2300  Protein:  100-110g  Fluid:  2L/day  Clayton Bibles, MS, RD, LDN [place] [facility] Pager: [phone] After Hours Pager: [phone]

## 2018-03-31 NOTE — Progress Notes (Signed)
Discharge instructions reviewed with patient. Patient verbalized understanding. Patient discharged via private vehicle with wife.

## 2018-03-31 NOTE — Discharge Summary (Signed)
Physician Discharge Summary  Lee Barnes JXB:[id number] DOB: [birth year] DOA: 03/28/2018  PCP: Ronnald Nian, MD  Admit date: 03/28/2018 Discharge date: 03/31/2018  Admitted From: HOME Disposition:  HOME  Recommendations for Outpatient Follow-up:  1. Follow up with ENTin 1-2 weeks 2. Please obtain BMP/CBC in one week   Home Health:no Equipment/Devices:none  Discharge Condition:stable CODE STATUS:full Diet recommendation: Heart Healthy  Brief/Interim Summary: 67 y.o. male past medical history complex dental history status post oral cancer in remission who comes in as he was not feeling well the day prior in the ED was found to have a sodium of 114.  Discharge Diagnoses:  Active Problems:   History of oropharyngeal cancer   Hyponatremia  He was hypovolemic on admission feeling lightheaded upon standing he was started on gentle IV fluid hydration and his sodium came up nicely. Continue pain control no changes made to his other medications.  Discharge Instructions  Discharge Instructions    Diet - low sodium heart healthy   Complete by:  As directed    Increase activity slowly   Complete by:  As directed      Allergies as of 03/31/2018      Reactions   Codeine Nausea And Vomiting      Medication List    TAKE these medications   acetaminophen 500 MG tablet Commonly known as:  TYLENOL Take 500 mg by mouth every 5 hours   AMINO ACID PO Take 5 tablets by mouth 2 (two) times daily.   amLODipine 5 MG tablet Commonly known as:  NORVASC Take 5 mg by mouth every evening.   amoxicillin 250 MG/5ML suspension Commonly known as:  AMOXIL Take 10 mLs (500 mg total) by mouth 3 (three) times daily.   amoxicillin-clavulanate 875-125 MG tablet Commonly known as:  AUGMENTIN Take 1 tablet by mouth 2 (two) times daily.   cetirizine 10 MG tablet Commonly known as:  ZYRTEC Take 10 mg by mouth at bedtime.   chlorhexidine 0.12 % solution Commonly known as:  PERIDEX 15  mLs by Mouth Rinse route 2 (two) times daily.   feeding supplement Liqd Take 1 Container by mouth 2 (two) times daily between meals.   fluticasone 50 MCG/ACT nasal spray Commonly known as:  FLONASE SPRAY 2 SPRAYS INTO EACH NOSTRIL DAILY AS NEEDED What changed:  See the new instructions.   ibuprofen 100 MG/5ML suspension Commonly known as:  ADVIL,MOTRIN Take 10 mLs by mouth every 6 (six) hours as needed for pain.   MAG-OX 400 PO Take 400 mg by mouth daily.   NON FORMULARY "Men's Health" capsules: Take 1 capsule by mouth once time a day   ondansetron 8 MG tablet Commonly known as:  ZOFRAN Take 1 tablet (8 mg total) by mouth every 8 (eight) hours as needed for nausea or vomiting.   VITAMIN B-12 PO Take 1 tablet by mouth daily.       Allergies  Allergen Reactions  . Codeine Nausea And Vomiting    Consultations:  None   Procedures/Studies: Dg Chest 2 View  Result Date: 03/11/2018 CLINICAL DATA:  Cough EXAM: CHEST - 2 VIEW COMPARISON:  05/29/2016 FINDINGS: Heart and mediastinal contours are within normal limits. No focal opacities or effusions. No acute bony abnormality. IMPRESSION: No active cardiopulmonary disease. Electronically Signed   By: Charlett Nose M.D.   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 Fever and chills with difficulty swallowing for a few days. EXAM: CT NECK WITH CONTRAST TECHNIQUE: Multidetector CT imaging of the neck was performed using the standard protocol following the bolus administration of intravenous contrast. CONTRAST:  ISOVUE-300 IOPAMIDOL (ISOVUE-300) INJECTION 61% COMPARISON:  03/23/2013 CT of the neck. FINDINGS: Pharynx and larynx: Diffuse non masslike mucosal thickening of the oral and hypopharynx without abnormal enhancement, likely posttreatment changes. This appears mildly increased from 2016. Salivary glands:  Interval fatty atrophy of the right submandibular gland. Otherwise stable posttreatment changes within the lower parotid glands and left submandibular gland. Thyroid: Normal. Lymph nodes: None enlarged or abnormal density. Vascular: Increased nonenhancing and non masslike soft tissue within the right carotid space at the bifurcation level, likely radiation changes. Limited intracranial: Negative. Visualized orbits: Negative. Mastoids and visualized paranasal sinuses: Clear. Skeleton: Comminuted and 1/2 shaft's with displaced fracture of the right posterior body of the mandible with extensive surrounding edema in the right face involving the masticator compartment, right submandibular compartment, and right parapharyngeal compartment. Ill-defined lucent margins to the fracture. There is diffuse effacement of the right upper parapharyngeal space with ill-defined soft tissue attenuation that extends to the oropharyngeal mucosa, base of tongue, and medial masticator muscles (series 3, image 40) with local mass effect. Upper chest: Stable mild fibrosis at the lung apices. Other: None. IMPRESSION: 1. Right posterior body of mandible comminuted and displaced fracture. 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 2. Extensive surrounding edema involving the right masticator, right facial, right submandibular, and right parapharyngeal compartments. 3. Confluent soft tissue attenuation with mass effect in the right upper parapharyngeal space extending to base of tongue and right lateral wall of oropharynx does not abnormally enhance and is probably reactive edema due to the mandibular fracture. Recurrent neoplasm is possible and follow-up is recommended to ensure resolution. No new lymphadenopathy. Electronically Signed   By: Mitzi Hansen M.D.   On: 03/11/2018 19:00     Subjective: No complaints feels great  Discharge Exam: Vitals:   03/30/18 2121 03/31/18 0512  BP: (!)  148/98 133/86  Pulse: 91 75  Resp: 18 16  Temp: 98.1 F (36.7 C) 98.7 F (37.1 C)  SpO2: 98% 97%   Vitals:   03/30/18 1419 03/30/18 2121 03/30/18 2121 03/31/18 0512  BP: (!) 175/95 (!) 148/98 (!) 148/98 133/86  Pulse: 94 91 91 75  Resp:  18 18 16   Temp: 98.7 F (37.1 C) 98.1 F (36.7 C) 98.1 F (36.7 C) 98.7 F (37.1 C)  TempSrc: Oral Oral Oral Oral  SpO2: 100% 98% 98% 97%  Weight:      Height:        General: Pt is alert, awake, not in acute distress Cardiovascular: RRR, S1/S2 +, no rubs, no gallops Respiratory: CTA bilaterally, no wheezing, no rhonchi Abdominal: Soft, NT, ND, bowel sounds + Extremities: no edema, no cyanosis    The results of significant diagnostics from this hospitalization (including imaging, microbiology, ancillary and laboratory) are listed below for reference.     Microbiology: No results found for this or any previous visit (from the past 240 hour(s)).   Labs: BNP (last 3 results) No results for input(s): BNP in the last 8760 hours. Basic Metabolic Panel: Recent Labs  Lab 03/28/18 1022  03/30/18 1248 03/30/18 1724 03/30/18 2050 03/31/18 0100 03/31/18 0625  NA 114*   < > 126* 129* 126* 127* 130*  K 4.5   < > 4.2 4.2 4.1 4.3 4.7  CL 81*   < > 88* 92* 92* 95* 94*  CO2 21*   < > 26 25 24 22 26   GLUCOSE 97   < > 102* 103* 135* 101* 93  BUN 22*   < > 16 14 12 10 9   CREATININE 0.92   < > 0.77 0.82 0.78 0.68 0.78  CALCIUM 9.0   < > 9.5 9.4 9.0 9.1 9.3  MG 1.7  --   --   --   --   --   --    < > = values in this interval not displayed.   Liver Function Tests: Recent Labs  Lab 03/28/18 1022  AST 60*  ALT 65*  ALKPHOS 81  BILITOT 0.9  PROT 7.5  ALBUMIN 3.7   No results for input(s): LIPASE, AMYLASE in the last 168 hours. No results for input(s): AMMONIA in the last 168 hours. CBC: Recent Labs  Lab 03/28/18 1022 03/28/18 1619 03/29/18 0348  WBC 7.2 6.3 6.0  NEUTROABS 5.5  --   --   HGB 12.8* 11.4* 10.2*  HCT 35.1* 32.8*  30.4*  MCV 74.2* 77.5* 77.4*  PLT 364 319 298   Cardiac Enzymes: No results for input(s): CKTOTAL, CKMB, CKMBINDEX, TROPONINI in the last 168 hours. BNP: Invalid input(s): POCBNP CBG: No results for input(s): GLUCAP in the last 168 hours. D-Dimer No results for input(s): DDIMER in the last 72 hours. Hgb A1c No results for input(s): HGBA1C in the last 72 hours. Lipid Profile No results for input(s): CHOL, HDL, LDLCALC, TRIG, CHOLHDL, LDLDIRECT in the last 72 hours. Thyroid function studies No results for input(s): TSH, T4TOTAL, T3FREE, THYROIDAB in the last 72 hours.  Invalid input(s): FREET3 Anemia work up No results for input(s): VITAMINB12, FOLATE, FERRITIN, TIBC, IRON, RETICCTPCT in the last 72 hours. Urinalysis    Component Value Date/Time   COLORURINE YELLOW 03/11/2018 1701   APPEARANCEUR HAZY (A) 03/11/2018 1701   LABSPEC 1.015 03/11/2018 1701   LABSPEC 1.015 12/13/2010 1028   PHURINE 5.0 03/11/2018 1701   GLUCOSEU NEGATIVE 03/11/2018 1701   HGBUR SMALL (A) 03/11/2018 1701   BILIRUBINUR NEGATIVE 03/11/2018 1701   BILIRUBINUR n 02/19/2016 1519   BILIRUBINUR Negative 12/13/2010 1028   KETONESUR 5 (A) 03/11/2018 1701   PROTEINUR NEGATIVE 03/11/2018 1701   UROBILINOGEN negative 02/19/2016 1519   UROBILINOGEN 0.2 04/01/2015 2316   NITRITE NEGATIVE 03/11/2018 1701   LEUKOCYTESUR NEGATIVE 03/11/2018 1701   LEUKOCYTESUR Negative 12/13/2010 1028   Sepsis Labs Invalid input(s): PROCALCITONIN,  WBC,  LACTICIDVEN Microbiology No results found for this or any previous visit (from the past 240 hour(s)).   Time coordinating discharge: Over 40 minutes  SIGNED:   Marinda Elk, MD  [facility] 03/31/2018, 8:32 AM Pager   If 7PM-7AM, please contact night-coverage [email or web address] Password [email or web address]

## 2018-03-31 NOTE — Care Management Important Message (Signed)
Important Message  Patient Details  Name: Lee Barnes MRN: [id number] Date of Birth: 26-Jan-1951   Medicare Important Message Given:  Yes    Kerin Salen 03/31/2018, 12:17 [place] Message  Patient Details  Name: Lee Barnes MRN: [id number] Date of Birth: 09-15-1951   Medicare Important Message Given:  Yes    Kerin Salen 03/31/2018, 12:16 PM

## 2018-03-31 NOTE — Progress Notes (Signed)
MD ok with patient to take oral BP med and discharge home.

## 2018-04-01 DIAGNOSIS — H25013 Cortical age-related cataract, bilateral: Secondary | ICD-10-CM | POA: Diagnosis not present

## 2018-04-01 DIAGNOSIS — H2513 Age-related nuclear cataract, bilateral: Secondary | ICD-10-CM | POA: Diagnosis not present

## 2018-04-06 DIAGNOSIS — Z923 Personal history of irradiation: Secondary | ICD-10-CM | POA: Diagnosis not present

## 2018-04-06 DIAGNOSIS — L598 Other specified disorders of the skin and subcutaneous tissue related to radiation: Secondary | ICD-10-CM | POA: Diagnosis not present

## 2018-04-06 DIAGNOSIS — I10 Essential (primary) hypertension: Secondary | ICD-10-CM | POA: Diagnosis not present

## 2018-04-06 DIAGNOSIS — Z9221 Personal history of antineoplastic chemotherapy: Secondary | ICD-10-CM | POA: Diagnosis not present

## 2018-04-06 DIAGNOSIS — M272 Inflammatory conditions of jaws: Secondary | ICD-10-CM | POA: Diagnosis not present

## 2018-04-06 DIAGNOSIS — Z85810 Personal history of malignant neoplasm of tongue: Secondary | ICD-10-CM | POA: Diagnosis not present

## 2018-04-07 DIAGNOSIS — Z923 Personal history of irradiation: Secondary | ICD-10-CM | POA: Diagnosis not present

## 2018-04-07 DIAGNOSIS — M272 Inflammatory conditions of jaws: Secondary | ICD-10-CM | POA: Diagnosis not present

## 2018-04-07 DIAGNOSIS — Z9221 Personal history of antineoplastic chemotherapy: Secondary | ICD-10-CM | POA: Diagnosis not present

## 2018-04-07 DIAGNOSIS — L598 Other specified disorders of the skin and subcutaneous tissue related to radiation: Secondary | ICD-10-CM | POA: Diagnosis not present

## 2018-04-07 DIAGNOSIS — I10 Essential (primary) hypertension: Secondary | ICD-10-CM | POA: Diagnosis not present

## 2018-04-07 DIAGNOSIS — Z85810 Personal history of malignant neoplasm of tongue: Secondary | ICD-10-CM | POA: Diagnosis not present

## 2018-04-08 DIAGNOSIS — Z9221 Personal history of antineoplastic chemotherapy: Secondary | ICD-10-CM | POA: Diagnosis not present

## 2018-04-08 DIAGNOSIS — M272 Inflammatory conditions of jaws: Secondary | ICD-10-CM | POA: Diagnosis not present

## 2018-04-08 DIAGNOSIS — I10 Essential (primary) hypertension: Secondary | ICD-10-CM | POA: Diagnosis not present

## 2018-04-08 DIAGNOSIS — Z85810 Personal history of malignant neoplasm of tongue: Secondary | ICD-10-CM | POA: Diagnosis not present

## 2018-04-08 DIAGNOSIS — Z923 Personal history of irradiation: Secondary | ICD-10-CM | POA: Diagnosis not present

## 2018-04-08 DIAGNOSIS — L598 Other specified disorders of the skin and subcutaneous tissue related to radiation: Secondary | ICD-10-CM | POA: Diagnosis not present

## 2018-04-09 DIAGNOSIS — H9313 Tinnitus, bilateral: Secondary | ICD-10-CM | POA: Diagnosis not present

## 2018-04-09 DIAGNOSIS — Z923 Personal history of irradiation: Secondary | ICD-10-CM | POA: Diagnosis not present

## 2018-04-09 DIAGNOSIS — Z85810 Personal history of malignant neoplasm of tongue: Secondary | ICD-10-CM | POA: Diagnosis not present

## 2018-04-09 DIAGNOSIS — J029 Acute pharyngitis, unspecified: Secondary | ICD-10-CM | POA: Diagnosis not present

## 2018-04-09 DIAGNOSIS — J310 Chronic rhinitis: Secondary | ICD-10-CM | POA: Diagnosis not present

## 2018-04-09 DIAGNOSIS — M272 Inflammatory conditions of jaws: Secondary | ICD-10-CM | POA: Diagnosis not present

## 2018-04-09 DIAGNOSIS — L598 Other specified disorders of the skin and subcutaneous tissue related to radiation: Secondary | ICD-10-CM | POA: Diagnosis not present

## 2018-04-09 DIAGNOSIS — I10 Essential (primary) hypertension: Secondary | ICD-10-CM | POA: Diagnosis not present

## 2018-04-09 DIAGNOSIS — Z9221 Personal history of antineoplastic chemotherapy: Secondary | ICD-10-CM | POA: Diagnosis not present

## 2018-04-10 DIAGNOSIS — Z85810 Personal history of malignant neoplasm of tongue: Secondary | ICD-10-CM | POA: Diagnosis not present

## 2018-04-10 DIAGNOSIS — L598 Other specified disorders of the skin and subcutaneous tissue related to radiation: Secondary | ICD-10-CM | POA: Diagnosis not present

## 2018-04-10 DIAGNOSIS — Z9221 Personal history of antineoplastic chemotherapy: Secondary | ICD-10-CM | POA: Diagnosis not present

## 2018-04-10 DIAGNOSIS — Z923 Personal history of irradiation: Secondary | ICD-10-CM | POA: Diagnosis not present

## 2018-04-10 DIAGNOSIS — I10 Essential (primary) hypertension: Secondary | ICD-10-CM | POA: Diagnosis not present

## 2018-04-10 DIAGNOSIS — M272 Inflammatory conditions of jaws: Secondary | ICD-10-CM | POA: Diagnosis not present

## 2018-04-14 DIAGNOSIS — M272 Inflammatory conditions of jaws: Secondary | ICD-10-CM | POA: Diagnosis not present

## 2018-04-14 DIAGNOSIS — Z85810 Personal history of malignant neoplasm of tongue: Secondary | ICD-10-CM | POA: Diagnosis not present

## 2018-04-14 DIAGNOSIS — Z9221 Personal history of antineoplastic chemotherapy: Secondary | ICD-10-CM | POA: Diagnosis not present

## 2018-04-14 DIAGNOSIS — I10 Essential (primary) hypertension: Secondary | ICD-10-CM | POA: Diagnosis not present

## 2018-04-14 DIAGNOSIS — Z923 Personal history of irradiation: Secondary | ICD-10-CM | POA: Diagnosis not present

## 2018-04-14 DIAGNOSIS — L598 Other specified disorders of the skin and subcutaneous tissue related to radiation: Secondary | ICD-10-CM | POA: Diagnosis not present

## 2018-04-15 DIAGNOSIS — Z85810 Personal history of malignant neoplasm of tongue: Secondary | ICD-10-CM | POA: Diagnosis not present

## 2018-04-15 DIAGNOSIS — Z923 Personal history of irradiation: Secondary | ICD-10-CM | POA: Diagnosis not present

## 2018-04-15 DIAGNOSIS — Z9221 Personal history of antineoplastic chemotherapy: Secondary | ICD-10-CM | POA: Diagnosis not present

## 2018-04-15 DIAGNOSIS — I10 Essential (primary) hypertension: Secondary | ICD-10-CM | POA: Diagnosis not present

## 2018-04-15 DIAGNOSIS — L598 Other specified disorders of the skin and subcutaneous tissue related to radiation: Secondary | ICD-10-CM | POA: Diagnosis not present

## 2018-04-15 DIAGNOSIS — M272 Inflammatory conditions of jaws: Secondary | ICD-10-CM | POA: Diagnosis not present

## 2018-04-16 DIAGNOSIS — Z85810 Personal history of malignant neoplasm of tongue: Secondary | ICD-10-CM | POA: Diagnosis not present

## 2018-04-16 DIAGNOSIS — I10 Essential (primary) hypertension: Secondary | ICD-10-CM | POA: Diagnosis not present

## 2018-04-16 DIAGNOSIS — M272 Inflammatory conditions of jaws: Secondary | ICD-10-CM | POA: Diagnosis not present

## 2018-04-16 DIAGNOSIS — Z9221 Personal history of antineoplastic chemotherapy: Secondary | ICD-10-CM | POA: Diagnosis not present

## 2018-04-16 DIAGNOSIS — L598 Other specified disorders of the skin and subcutaneous tissue related to radiation: Secondary | ICD-10-CM | POA: Diagnosis not present

## 2018-04-16 DIAGNOSIS — Z923 Personal history of irradiation: Secondary | ICD-10-CM | POA: Diagnosis not present

## 2018-04-17 DIAGNOSIS — Z85810 Personal history of malignant neoplasm of tongue: Secondary | ICD-10-CM | POA: Diagnosis not present

## 2018-04-17 DIAGNOSIS — L598 Other specified disorders of the skin and subcutaneous tissue related to radiation: Secondary | ICD-10-CM | POA: Diagnosis not present

## 2018-04-17 DIAGNOSIS — Z923 Personal history of irradiation: Secondary | ICD-10-CM | POA: Diagnosis not present

## 2018-04-17 DIAGNOSIS — M272 Inflammatory conditions of jaws: Secondary | ICD-10-CM | POA: Diagnosis not present

## 2018-04-17 DIAGNOSIS — I10 Essential (primary) hypertension: Secondary | ICD-10-CM | POA: Diagnosis not present

## 2018-04-17 DIAGNOSIS — Z9221 Personal history of antineoplastic chemotherapy: Secondary | ICD-10-CM | POA: Diagnosis not present

## 2018-04-20 ENCOUNTER — Encounter (HOSPITAL_BASED_OUTPATIENT_CLINIC_OR_DEPARTMENT_OTHER): Payer: Medicare Other | Attending: Internal Medicine

## 2018-04-20 DIAGNOSIS — M272 Inflammatory conditions of jaws: Secondary | ICD-10-CM | POA: Insufficient documentation

## 2018-04-20 DIAGNOSIS — Y842 Radiological procedure and radiotherapy as the cause of abnormal reaction of the patient, or of later complication, without mention of misadventure at the time of the procedure: Secondary | ICD-10-CM | POA: Insufficient documentation

## 2018-04-20 DIAGNOSIS — Z85810 Personal history of malignant neoplasm of tongue: Secondary | ICD-10-CM | POA: Diagnosis not present

## 2018-04-20 DIAGNOSIS — L598 Other specified disorders of the skin and subcutaneous tissue related to radiation: Secondary | ICD-10-CM | POA: Insufficient documentation

## 2018-04-21 DIAGNOSIS — M272 Inflammatory conditions of jaws: Secondary | ICD-10-CM | POA: Diagnosis not present

## 2018-04-21 DIAGNOSIS — Z85810 Personal history of malignant neoplasm of tongue: Secondary | ICD-10-CM | POA: Diagnosis not present

## 2018-04-21 DIAGNOSIS — L598 Other specified disorders of the skin and subcutaneous tissue related to radiation: Secondary | ICD-10-CM | POA: Diagnosis not present

## 2018-04-22 DIAGNOSIS — L598 Other specified disorders of the skin and subcutaneous tissue related to radiation: Secondary | ICD-10-CM | POA: Diagnosis not present

## 2018-04-22 DIAGNOSIS — M272 Inflammatory conditions of jaws: Secondary | ICD-10-CM | POA: Diagnosis not present

## 2018-04-22 DIAGNOSIS — Z85810 Personal history of malignant neoplasm of tongue: Secondary | ICD-10-CM | POA: Diagnosis not present

## 2018-04-23 DIAGNOSIS — Z85810 Personal history of malignant neoplasm of tongue: Secondary | ICD-10-CM | POA: Diagnosis not present

## 2018-04-23 DIAGNOSIS — M272 Inflammatory conditions of jaws: Secondary | ICD-10-CM | POA: Diagnosis not present

## 2018-04-23 DIAGNOSIS — L598 Other specified disorders of the skin and subcutaneous tissue related to radiation: Secondary | ICD-10-CM | POA: Diagnosis not present

## 2018-04-24 DIAGNOSIS — Z85810 Personal history of malignant neoplasm of tongue: Secondary | ICD-10-CM | POA: Diagnosis not present

## 2018-04-24 DIAGNOSIS — M272 Inflammatory conditions of jaws: Secondary | ICD-10-CM | POA: Diagnosis not present

## 2018-04-24 DIAGNOSIS — L598 Other specified disorders of the skin and subcutaneous tissue related to radiation: Secondary | ICD-10-CM | POA: Diagnosis not present

## 2018-04-27 DIAGNOSIS — M272 Inflammatory conditions of jaws: Secondary | ICD-10-CM | POA: Diagnosis not present

## 2018-04-27 DIAGNOSIS — Z85810 Personal history of malignant neoplasm of tongue: Secondary | ICD-10-CM | POA: Diagnosis not present

## 2018-04-27 DIAGNOSIS — L598 Other specified disorders of the skin and subcutaneous tissue related to radiation: Secondary | ICD-10-CM | POA: Diagnosis not present

## 2018-04-28 DIAGNOSIS — Z85810 Personal history of malignant neoplasm of tongue: Secondary | ICD-10-CM | POA: Diagnosis not present

## 2018-04-28 DIAGNOSIS — M272 Inflammatory conditions of jaws: Secondary | ICD-10-CM | POA: Diagnosis not present

## 2018-04-28 DIAGNOSIS — L598 Other specified disorders of the skin and subcutaneous tissue related to radiation: Secondary | ICD-10-CM | POA: Diagnosis not present

## 2018-04-29 DIAGNOSIS — M272 Inflammatory conditions of jaws: Secondary | ICD-10-CM | POA: Diagnosis not present

## 2018-04-29 DIAGNOSIS — L598 Other specified disorders of the skin and subcutaneous tissue related to radiation: Secondary | ICD-10-CM | POA: Diagnosis not present

## 2018-04-29 DIAGNOSIS — Z85810 Personal history of malignant neoplasm of tongue: Secondary | ICD-10-CM | POA: Diagnosis not present

## 2018-04-30 DIAGNOSIS — Z85810 Personal history of malignant neoplasm of tongue: Secondary | ICD-10-CM | POA: Diagnosis not present

## 2018-04-30 DIAGNOSIS — M272 Inflammatory conditions of jaws: Secondary | ICD-10-CM | POA: Diagnosis not present

## 2018-04-30 DIAGNOSIS — L598 Other specified disorders of the skin and subcutaneous tissue related to radiation: Secondary | ICD-10-CM | POA: Diagnosis not present

## 2018-05-01 DIAGNOSIS — M272 Inflammatory conditions of jaws: Secondary | ICD-10-CM | POA: Diagnosis not present

## 2018-05-01 DIAGNOSIS — Z85810 Personal history of malignant neoplasm of tongue: Secondary | ICD-10-CM | POA: Diagnosis not present

## 2018-05-01 DIAGNOSIS — L598 Other specified disorders of the skin and subcutaneous tissue related to radiation: Secondary | ICD-10-CM | POA: Diagnosis not present

## 2018-05-04 DIAGNOSIS — L598 Other specified disorders of the skin and subcutaneous tissue related to radiation: Secondary | ICD-10-CM | POA: Diagnosis not present

## 2018-05-04 DIAGNOSIS — M272 Inflammatory conditions of jaws: Secondary | ICD-10-CM | POA: Diagnosis not present

## 2018-05-04 DIAGNOSIS — Z85810 Personal history of malignant neoplasm of tongue: Secondary | ICD-10-CM | POA: Diagnosis not present

## 2018-05-05 DIAGNOSIS — Z85810 Personal history of malignant neoplasm of tongue: Secondary | ICD-10-CM | POA: Diagnosis not present

## 2018-05-05 DIAGNOSIS — M272 Inflammatory conditions of jaws: Secondary | ICD-10-CM | POA: Diagnosis not present

## 2018-05-05 DIAGNOSIS — L598 Other specified disorders of the skin and subcutaneous tissue related to radiation: Secondary | ICD-10-CM | POA: Diagnosis not present

## 2018-05-06 DIAGNOSIS — L598 Other specified disorders of the skin and subcutaneous tissue related to radiation: Secondary | ICD-10-CM | POA: Diagnosis not present

## 2018-05-06 DIAGNOSIS — M272 Inflammatory conditions of jaws: Secondary | ICD-10-CM | POA: Diagnosis not present

## 2018-05-06 DIAGNOSIS — Z85810 Personal history of malignant neoplasm of tongue: Secondary | ICD-10-CM | POA: Diagnosis not present

## 2018-05-07 DIAGNOSIS — Z85810 Personal history of malignant neoplasm of tongue: Secondary | ICD-10-CM | POA: Diagnosis not present

## 2018-05-07 DIAGNOSIS — L598 Other specified disorders of the skin and subcutaneous tissue related to radiation: Secondary | ICD-10-CM | POA: Diagnosis not present

## 2018-05-07 DIAGNOSIS — M272 Inflammatory conditions of jaws: Secondary | ICD-10-CM | POA: Diagnosis not present

## 2018-05-08 DIAGNOSIS — M272 Inflammatory conditions of jaws: Secondary | ICD-10-CM | POA: Diagnosis not present

## 2018-05-08 DIAGNOSIS — Z85810 Personal history of malignant neoplasm of tongue: Secondary | ICD-10-CM | POA: Diagnosis not present

## 2018-05-08 DIAGNOSIS — L598 Other specified disorders of the skin and subcutaneous tissue related to radiation: Secondary | ICD-10-CM | POA: Diagnosis not present

## 2018-05-11 DIAGNOSIS — Z85810 Personal history of malignant neoplasm of tongue: Secondary | ICD-10-CM | POA: Diagnosis not present

## 2018-05-11 DIAGNOSIS — L598 Other specified disorders of the skin and subcutaneous tissue related to radiation: Secondary | ICD-10-CM | POA: Diagnosis not present

## 2018-05-11 DIAGNOSIS — M272 Inflammatory conditions of jaws: Secondary | ICD-10-CM | POA: Diagnosis not present

## 2018-05-12 DIAGNOSIS — M272 Inflammatory conditions of jaws: Secondary | ICD-10-CM | POA: Diagnosis not present

## 2018-05-12 DIAGNOSIS — Z85810 Personal history of malignant neoplasm of tongue: Secondary | ICD-10-CM | POA: Diagnosis not present

## 2018-05-12 DIAGNOSIS — L598 Other specified disorders of the skin and subcutaneous tissue related to radiation: Secondary | ICD-10-CM | POA: Diagnosis not present

## 2018-05-13 DIAGNOSIS — Z85810 Personal history of malignant neoplasm of tongue: Secondary | ICD-10-CM | POA: Diagnosis not present

## 2018-05-13 DIAGNOSIS — L598 Other specified disorders of the skin and subcutaneous tissue related to radiation: Secondary | ICD-10-CM | POA: Diagnosis not present

## 2018-05-13 DIAGNOSIS — M272 Inflammatory conditions of jaws: Secondary | ICD-10-CM | POA: Diagnosis not present

## 2018-05-14 DIAGNOSIS — Z85810 Personal history of malignant neoplasm of tongue: Secondary | ICD-10-CM | POA: Diagnosis not present

## 2018-05-14 DIAGNOSIS — L598 Other specified disorders of the skin and subcutaneous tissue related to radiation: Secondary | ICD-10-CM | POA: Diagnosis not present

## 2018-05-14 DIAGNOSIS — M272 Inflammatory conditions of jaws: Secondary | ICD-10-CM | POA: Diagnosis not present

## 2018-05-15 DIAGNOSIS — M272 Inflammatory conditions of jaws: Secondary | ICD-10-CM | POA: Diagnosis not present

## 2018-05-15 DIAGNOSIS — L598 Other specified disorders of the skin and subcutaneous tissue related to radiation: Secondary | ICD-10-CM | POA: Diagnosis not present

## 2018-05-15 DIAGNOSIS — Z85810 Personal history of malignant neoplasm of tongue: Secondary | ICD-10-CM | POA: Diagnosis not present

## 2018-05-18 ENCOUNTER — Encounter (HOSPITAL_BASED_OUTPATIENT_CLINIC_OR_DEPARTMENT_OTHER): Payer: Medicare Other | Attending: Internal Medicine

## 2018-05-18 DIAGNOSIS — Y842 Radiological procedure and radiotherapy as the cause of abnormal reaction of the patient, or of later complication, without mention of misadventure at the time of the procedure: Secondary | ICD-10-CM | POA: Diagnosis not present

## 2018-05-18 DIAGNOSIS — M272 Inflammatory conditions of jaws: Secondary | ICD-10-CM | POA: Insufficient documentation

## 2018-05-19 DIAGNOSIS — M272 Inflammatory conditions of jaws: Secondary | ICD-10-CM | POA: Diagnosis not present

## 2018-05-20 ENCOUNTER — Encounter (HOSPITAL_BASED_OUTPATIENT_CLINIC_OR_DEPARTMENT_OTHER): Payer: PRIVATE HEALTH INSURANCE

## 2018-05-20 DIAGNOSIS — M272 Inflammatory conditions of jaws: Secondary | ICD-10-CM | POA: Diagnosis not present

## 2018-05-22 DIAGNOSIS — M272 Inflammatory conditions of jaws: Secondary | ICD-10-CM | POA: Diagnosis not present

## 2018-05-25 DIAGNOSIS — M272 Inflammatory conditions of jaws: Secondary | ICD-10-CM | POA: Diagnosis not present

## 2018-05-26 DIAGNOSIS — M272 Inflammatory conditions of jaws: Secondary | ICD-10-CM | POA: Diagnosis not present

## 2018-05-27 DIAGNOSIS — M272 Inflammatory conditions of jaws: Secondary | ICD-10-CM | POA: Diagnosis not present

## 2018-05-28 DIAGNOSIS — M272 Inflammatory conditions of jaws: Secondary | ICD-10-CM | POA: Diagnosis not present

## 2018-05-29 DIAGNOSIS — M272 Inflammatory conditions of jaws: Secondary | ICD-10-CM | POA: Diagnosis not present

## 2018-06-01 DIAGNOSIS — M272 Inflammatory conditions of jaws: Secondary | ICD-10-CM | POA: Diagnosis not present

## 2018-06-05 DIAGNOSIS — M272 Inflammatory conditions of jaws: Secondary | ICD-10-CM | POA: Diagnosis not present

## 2018-09-08 DIAGNOSIS — L821 Other seborrheic keratosis: Secondary | ICD-10-CM | POA: Diagnosis not present

## 2018-09-08 DIAGNOSIS — Z85828 Personal history of other malignant neoplasm of skin: Secondary | ICD-10-CM | POA: Diagnosis not present

## 2018-09-08 DIAGNOSIS — L820 Inflamed seborrheic keratosis: Secondary | ICD-10-CM | POA: Diagnosis not present

## 2018-09-08 DIAGNOSIS — D225 Melanocytic nevi of trunk: Secondary | ICD-10-CM | POA: Diagnosis not present

## 2018-09-08 DIAGNOSIS — L570 Actinic keratosis: Secondary | ICD-10-CM | POA: Diagnosis not present

## 2018-09-22 ENCOUNTER — Other Ambulatory Visit (INDEPENDENT_AMBULATORY_CARE_PROVIDER_SITE_OTHER): Payer: Medicare Other

## 2018-09-22 ENCOUNTER — Telehealth: Payer: Self-pay

## 2018-09-22 DIAGNOSIS — Z23 Encounter for immunization: Secondary | ICD-10-CM

## 2018-09-22 NOTE — Telephone Encounter (Signed)
Patient came in for flu shot and wanted his BP taken.  My reading was 160/100 and his machine was 182/112, patient had not taken his BP medication today.  Patient has an appointment with Dr Redmond School for tomorrow at 930am.

## 2018-09-23 ENCOUNTER — Encounter: Payer: Self-pay | Admitting: Family Medicine

## 2018-09-23 ENCOUNTER — Ambulatory Visit (INDEPENDENT_AMBULATORY_CARE_PROVIDER_SITE_OTHER): Payer: Medicare Other | Admitting: Family Medicine

## 2018-09-23 VITALS — BP 142/92 | HR 80 | Temp 97.6°F | Wt 171.4 lb

## 2018-09-23 DIAGNOSIS — I951 Orthostatic hypotension: Secondary | ICD-10-CM

## 2018-09-23 DIAGNOSIS — Z85819 Personal history of malignant neoplasm of unspecified site of lip, oral cavity, and pharynx: Secondary | ICD-10-CM

## 2018-09-23 NOTE — Progress Notes (Signed)
Subjective:    Patient ID: Lee Barnes, male    DOB: 01-22-1951, 67 y.o.   MRN: [id number]  HPI He is here for consultation concerning possible low blood pressure.  His blood pressure readings at home showed wide variation and sometimes quite low.  He says that over the last several months he has noted episodes of weakness and fatigue.  Further questioning indicates that this usually occurs when he is stooping over and then stands up.  He notes no chest pain or palpitations and states the symptoms usually go away within several minutes.  Of note is recently he did have major jaw surgery due to previous radiation for treatment of his oropharyngeal cancer.  He was on a liquid diet for several months.   Review of Systems     Objective:   Physical Exam Alert and in no distress.  Cardiac and lung exam is normal. His blood pressure cuff compared to ours showed wide variation in readings.       Assessment & Plan:  Orthostatic hypotension - Plan: Comprehensive metabolic panel  History of oropharyngeal cancer - Plan: Comprehensive metabolic panel I explained that his cuff is not accurate and recommend getting a new one.  I also explained that his symptoms are more consistent with postural hypotension.  Discussed this with him and his wife in detail.  Essentially encouraged him to go from squatting to standing position more slowly and also even from lying to sitting to standing to help mitigate this.  Also discussed increasing his fluid intake especially in the mornings and possibly cutting it back later in the day.  They were comfortable with this. I will check electrolytes as in the past he has had some episodes of hyponatremia.  I explained the hyponatremia can cause  weakness but it would not be episodic.

## 2018-09-24 LAB — COMPREHENSIVE METABOLIC PANEL
ALT: 11 IU/L (ref 0–44)
AST: 19 IU/L (ref 0–40)
Albumin/Globulin Ratio: 1.5 (ref 1.2–2.2)
Albumin: 4.3 g/dL (ref 3.6–4.8)
Alkaline Phosphatase: 122 IU/L — ABNORMAL HIGH (ref 39–117)
BUN/Creatinine Ratio: 12 (ref 10–24)
BUN: 13 mg/dL (ref 8–27)
Bilirubin Total: 0.3 mg/dL (ref 0.0–1.2)
CO2: 22 mmol/L (ref 20–29)
Calcium: 9.6 mg/dL (ref 8.6–10.2)
Chloride: 94 mmol/L — ABNORMAL LOW (ref 96–106)
Creatinine, Ser: 1.1 mg/dL (ref 0.76–1.27)
GFR calc Af Amer: 80 mL/min/{1.73_m2} (ref >59)
GFR calc non Af Amer: 70 mL/min/{1.73_m2} (ref >59)
Globulin, Total: 2.8 g/dL (ref 1.5–4.5)
Glucose: 94 mg/dL (ref 65–99)
Potassium: 5.3 mmol/L — ABNORMAL HIGH (ref 3.5–5.2)
Sodium: 130 mmol/L — ABNORMAL LOW (ref 134–144)
Total Protein: 7.1 g/dL (ref 6.0–8.5)

## 2018-10-11 ENCOUNTER — Other Ambulatory Visit: Payer: Self-pay | Admitting: Family Medicine

## 2018-10-11 DIAGNOSIS — J3089 Other allergic rhinitis: Secondary | ICD-10-CM

## 2018-11-03 ENCOUNTER — Other Ambulatory Visit: Payer: Self-pay

## 2018-11-03 ENCOUNTER — Telehealth: Payer: Self-pay | Admitting: Family Medicine

## 2018-11-03 DIAGNOSIS — R899 Unspecified abnormal finding in specimens from other organs, systems and tissues: Secondary | ICD-10-CM

## 2018-11-03 NOTE — Telephone Encounter (Signed)
ok 

## 2018-11-03 NOTE — Telephone Encounter (Signed)
Pt wife called and wants him to be referred to [facility] for his sodium and BP, she wants someone else to look as his labs and stuff, pt wife can be reached at [phone]

## 2018-11-03 NOTE — Telephone Encounter (Signed)
Pt wife called and advised. [place]

## 2018-11-24 ENCOUNTER — Other Ambulatory Visit: Payer: Self-pay | Admitting: Family Medicine

## 2018-11-26 DIAGNOSIS — S02609A Fracture of mandible, unspecified, initial encounter for closed fracture: Secondary | ICD-10-CM | POA: Diagnosis not present

## 2018-12-07 ENCOUNTER — Encounter: Payer: Self-pay | Admitting: Family Medicine

## 2018-12-07 ENCOUNTER — Ambulatory Visit (INDEPENDENT_AMBULATORY_CARE_PROVIDER_SITE_OTHER): Payer: Medicare Other | Admitting: Family Medicine

## 2018-12-07 VITALS — BP 170/102 | HR 78 | Temp 97.7°F | Wt 176.0 lb

## 2018-12-07 DIAGNOSIS — R201 Hypoesthesia of skin: Secondary | ICD-10-CM | POA: Diagnosis not present

## 2018-12-07 NOTE — Progress Notes (Signed)
Subjective:    Patient ID: Lee Barnes, male    DOB: October 02, 1951, 68 y.o.   MRN: [id number]  HPI He woke up last Wednesday and noted numbness to the tip of his right index finger.  No history of injury to that finger.  No other fingers or extremities noted.   Review of Systems     Objective:   Physical Exam Alert and in no distress.  Exam of the finger shows no swelling, deformity, erythema.  Slight decreased sensation to the fingertip is noted.       Assessment & Plan:  Hypesthesia I explained that since it seems to be just the palmar surface of the fingertip that this is most likely distal damage to a cutaneous nerve and at this point no further intervention or evaluation is needed.  If he notes that this gets worse and takes in more of his hand, further evaluation may be needed.

## 2018-12-24 DIAGNOSIS — N189 Chronic kidney disease, unspecified: Secondary | ICD-10-CM | POA: Diagnosis not present

## 2018-12-24 DIAGNOSIS — R030 Elevated blood-pressure reading, without diagnosis of hypertension: Secondary | ICD-10-CM | POA: Diagnosis not present

## 2018-12-24 DIAGNOSIS — R0989 Other specified symptoms and signs involving the circulatory and respiratory systems: Secondary | ICD-10-CM | POA: Diagnosis not present

## 2018-12-24 DIAGNOSIS — I129 Hypertensive chronic kidney disease with stage 1 through stage 4 chronic kidney disease, or unspecified chronic kidney disease: Secondary | ICD-10-CM | POA: Diagnosis not present

## 2018-12-27 DIAGNOSIS — I10 Essential (primary) hypertension: Secondary | ICD-10-CM | POA: Diagnosis not present

## 2018-12-27 DIAGNOSIS — N189 Chronic kidney disease, unspecified: Secondary | ICD-10-CM | POA: Diagnosis not present

## 2018-12-29 DIAGNOSIS — R0989 Other specified symptoms and signs involving the circulatory and respiratory systems: Secondary | ICD-10-CM | POA: Diagnosis not present

## 2018-12-29 DIAGNOSIS — I129 Hypertensive chronic kidney disease with stage 1 through stage 4 chronic kidney disease, or unspecified chronic kidney disease: Secondary | ICD-10-CM | POA: Diagnosis not present

## 2018-12-29 DIAGNOSIS — I10 Essential (primary) hypertension: Secondary | ICD-10-CM | POA: Diagnosis not present

## 2018-12-29 DIAGNOSIS — R030 Elevated blood-pressure reading, without diagnosis of hypertension: Secondary | ICD-10-CM | POA: Diagnosis not present

## 2018-12-29 DIAGNOSIS — N189 Chronic kidney disease, unspecified: Secondary | ICD-10-CM | POA: Diagnosis not present

## 2019-01-04 DIAGNOSIS — I10 Essential (primary) hypertension: Secondary | ICD-10-CM | POA: Diagnosis not present

## 2019-01-04 DIAGNOSIS — R55 Syncope and collapse: Secondary | ICD-10-CM | POA: Diagnosis not present

## 2019-01-04 DIAGNOSIS — N189 Chronic kidney disease, unspecified: Secondary | ICD-10-CM | POA: Diagnosis not present

## 2019-01-04 DIAGNOSIS — R0989 Other specified symptoms and signs involving the circulatory and respiratory systems: Secondary | ICD-10-CM | POA: Diagnosis not present

## 2019-01-04 DIAGNOSIS — R0609 Other forms of dyspnea: Secondary | ICD-10-CM | POA: Diagnosis not present

## 2019-01-18 DIAGNOSIS — I10 Essential (primary) hypertension: Secondary | ICD-10-CM | POA: Diagnosis not present

## 2019-01-18 DIAGNOSIS — R55 Syncope and collapse: Secondary | ICD-10-CM | POA: Diagnosis not present

## 2019-01-18 DIAGNOSIS — R0609 Other forms of dyspnea: Secondary | ICD-10-CM | POA: Diagnosis not present

## 2019-02-11 ENCOUNTER — Telehealth: Payer: Self-pay

## 2019-02-11 NOTE — Telephone Encounter (Signed)
LVM for pt on how to download app webex and if he did not want to we could still do a telephone visit. Lee Barnes

## 2019-02-19 ENCOUNTER — Ambulatory Visit: Payer: Medicare Other | Admitting: Family Medicine

## 2019-03-18 ENCOUNTER — Encounter: Payer: Self-pay | Admitting: Family Medicine

## 2019-03-25 DIAGNOSIS — I951 Orthostatic hypotension: Secondary | ICD-10-CM | POA: Diagnosis not present

## 2019-03-25 DIAGNOSIS — R55 Syncope and collapse: Secondary | ICD-10-CM | POA: Diagnosis not present

## 2019-03-25 DIAGNOSIS — R0989 Other specified symptoms and signs involving the circulatory and respiratory systems: Secondary | ICD-10-CM | POA: Diagnosis not present

## 2019-03-30 ENCOUNTER — Encounter: Payer: Self-pay | Admitting: Family Medicine

## 2019-03-30 ENCOUNTER — Other Ambulatory Visit: Payer: Self-pay

## 2019-03-30 ENCOUNTER — Ambulatory Visit (INDEPENDENT_AMBULATORY_CARE_PROVIDER_SITE_OTHER): Payer: Medicare Other | Admitting: Family Medicine

## 2019-03-30 VITALS — BP 158/90 | HR 86 | Temp 98.2°F | Ht 73.0 in | Wt 170.4 lb

## 2019-03-30 DIAGNOSIS — G903 Multi-system degeneration of the autonomic nervous system: Secondary | ICD-10-CM | POA: Insufficient documentation

## 2019-03-30 DIAGNOSIS — I10 Essential (primary) hypertension: Secondary | ICD-10-CM | POA: Diagnosis not present

## 2019-03-30 DIAGNOSIS — Z85819 Personal history of malignant neoplasm of unspecified site of lip, oral cavity, and pharynx: Secondary | ICD-10-CM | POA: Diagnosis not present

## 2019-03-30 DIAGNOSIS — H9113 Presbycusis, bilateral: Secondary | ICD-10-CM

## 2019-03-30 DIAGNOSIS — E871 Hypo-osmolality and hyponatremia: Secondary | ICD-10-CM

## 2019-03-30 DIAGNOSIS — J301 Allergic rhinitis due to pollen: Secondary | ICD-10-CM

## 2019-03-30 DIAGNOSIS — N529 Male erectile dysfunction, unspecified: Secondary | ICD-10-CM

## 2019-03-30 DIAGNOSIS — K5790 Diverticulosis of intestine, part unspecified, without perforation or abscess without bleeding: Secondary | ICD-10-CM

## 2019-03-30 LAB — CBC WITH DIFFERENTIAL/PLATELET
Basophils Absolute: 0 x10E3/uL (ref 0.0–0.2)
Basos: 0 %
EOS (ABSOLUTE): 0.2 x10E3/uL (ref 0.0–0.4)
Eos: 4 %
Hematocrit: 38.5 % (ref 37.5–51.0)
Hemoglobin: 13.6 g/dL (ref 13.0–17.7)
Immature Grans (Abs): 0 x10E3/uL (ref 0.0–0.1)
Immature Granulocytes: 0 %
Lymphocytes Absolute: 0.7 x10E3/uL (ref 0.7–3.1)
Lymphs: 14 %
MCH: 28.8 pg (ref 26.6–33.0)
MCHC: 35.3 g/dL (ref 31.5–35.7)
MCV: 81 fL (ref 79–97)
Monocytes Absolute: 0.7 x10E3/uL (ref 0.1–0.9)
Monocytes: 15 %
Neutrophils Absolute: 3.3 x10E3/uL (ref 1.4–7.0)
Neutrophils: 67 %
Platelets: 254 x10E3/uL (ref 150–450)
RBC: 4.73 x10E6/uL (ref 4.14–5.80)
RDW: 13.9 % (ref 11.6–15.4)
WBC: 4.9 x10E3/uL (ref 3.4–10.8)

## 2019-03-30 LAB — COMPREHENSIVE METABOLIC PANEL WITH GFR
ALT: 17 IU/L (ref 0–44)
AST: 24 IU/L (ref 0–40)
Albumin/Globulin Ratio: 1.7 (ref 1.2–2.2)
Albumin: 4.7 g/dL (ref 3.8–4.8)
Alkaline Phosphatase: 127 IU/L — ABNORMAL HIGH (ref 39–117)
BUN/Creatinine Ratio: 13 (ref 10–24)
BUN: 14 mg/dL (ref 8–27)
Bilirubin Total: 0.5 mg/dL (ref 0.0–1.2)
CO2: 23 mmol/L (ref 20–29)
Calcium: 10 mg/dL (ref 8.6–10.2)
Chloride: 97 mmol/L (ref 96–106)
Creatinine, Ser: 1.11 mg/dL (ref 0.76–1.27)
GFR calc Af Amer: 79 mL/min/1.73 (ref >59)
GFR calc non Af Amer: 68 mL/min/1.73 (ref >59)
Globulin, Total: 2.8 g/dL (ref 1.5–4.5)
Glucose: 93 mg/dL (ref 65–99)
Potassium: 5 mmol/L (ref 3.5–5.2)
Sodium: 133 mmol/L — ABNORMAL LOW (ref 134–144)
Total Protein: 7.5 g/dL (ref 6.0–8.5)

## 2019-03-30 MED ORDER — SILDENAFIL CITRATE 100 MG PO TABS: 50.0000 mg | ORAL_TABLET | Freq: Every day | ORAL | 11 refills | Status: DC | PRN

## 2019-03-30 NOTE — Progress Notes (Signed)
Lee Barnes is a 68 y.o. male who presents for annual wellness visit and follow-up on chronic medical conditions.  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  That information is now in the record.  It was recommended that he do exercises and wear support stockings to help with this.  This apparently occurred because of radiation to his neck.  His allergies seem to be under good control.  He does have a history of diverticulosis but has not had difficulty with that.  Presently he is taking multiple OTC medications as well as amlodipine.  He does use hearing aids.  He does also has some difficulty with erectile dysfunction and would like medication for that.  Immunizations and Health Maintenance Immunization History  Administered Date(s) Administered  . Influenza Split 10/07/2011, 09/23/2012  . Influenza, High Dose Seasonal PF 09/22/2018  . Pneumococcal Conjugate-13 11/19/2017  . Pneumococcal-Unspecified 08/18/2014  . Tdap 10/06/2014  . Zoster 12/14/2013   There are no preventive care reminders to display for this patient.  Last colonoscopy:2015 Last PSA: Dentist:[facility] Ophtho: shapiro Exercise:walking three times a week  Other doctors caring for patient include: Gershon Crane.Alinda Sierras. Mobarek Advanced Directives: No;info given Does Patient Have a Medical Advance Directive?: No Would patient like information on creating a medical advance directive?: Yes (MAU/Ambulatory/Procedural Areas - Information given)  Depression screen:  See questionnaire below.     Depression screen [facility] 2/9 03/30/2019 11/19/2017 07/27/2015  Decreased Interest 0 0 0  Down, Depressed, Hopeless 0 0 0  PHQ - 2 Score 0 0 0    Fall Screen: See Questionaire below.   Fall Risk  03/30/2019 11/19/2017 07/27/2015  Falls in the past year? 0 No No    ADL screen:  See questionnaire below.  Functional Status Survey: Is the patient deaf or have difficulty hearing?: No Does  the patient have difficulty seeing, even when wearing glasses/contacts?: No Does the patient have difficulty concentrating, remembering, or making decisions?: No Does the patient have difficulty walking or climbing stairs?: No Does the patient have difficulty dressing or bathing?: No Does the patient have difficulty doing errands alone such as visiting a doctor's office or shopping?: No   Review of Systems  Constitutional: -, -unexpected weight change, -anorexia, -fatigue Dermatology: denies changing moles, rash, lumps  Cardiology:  -chest pain, -palpitations, -orthopnea, Respiratory: -cough, -shortness of breath, -dyspnea on exertion, -wheezing,  Gastroenterology: -abdominal pain, -nausea, -vomiting, -diarrhea, -constipation, -dysphagia Hematology: -bleeding or bruising problems Musculoskeletal: -arthralgias, -myalgias, -joint swelling, -back pain, - Ophthalmology: -vision changes,  Urology: -dysuria, -difficulty urinating,  -urinary frequency, -urgency, incontinence Neurology: -, -numbness, , -memory loss, -falls, -dizziness    PHYSICAL EXAM:  BP (!) 158/90 (BP Location: Left Arm, Patient Position: Sitting)   Pulse 86   Temp 98.2 F (36.8 C)   Ht 6\' 1"  (1.854 m)   Wt 170 lb 6.4 oz (77.3 kg)   SpO2 97%   BMI 22.48 kg/m   General Appearance: Alert, cooperative, no distress, appears stated age Head: Normocephalic, without obvious abnormality, atraumatic Eyes: PERRL, conjunctiva/corneas clear, EOM's intact, fundi benign Ears: Normal TM's and external ear canals Nose: Nares normal, mucosa normal, no drainage or sinus   tenderness Throat: Lips, mucosa, and tongue normal; teeth and gums normal Neck: Supple, no lymphadenopathy, thyroid:no enlargement/tenderness/nodules; no carotid bruit or JVD.the right side of his neck is firm to palpation.   Lungs: Clear to auscultation bilaterally without wheezes, rales or ronchi; respirations unlabored Heart: Regular rate and  rhythm, S1 and  S2 normal, no murmur, rub or gallop Abdomen: Soft, non-tender, nondistended, normoactive bowel sounds, no masses, no hepatosplenomegaly Extremities: No clubbing, cyanosis or edema Pulses: 2+ and symmetric all extremities Skin: Skin color, texture, turgor normal, no rashes or lesions Lymph nodes: Cervical, supraclavicular, and axillary nodes normal Neurologic: CNII-XII intact, normal strength, sensation and gait; reflexes 2+ and symmetric throughout   Psych: Normal mood, affect, hygiene and grooming  ASSESSMENT/PLAN: Neurogenic orthostatic hypotension (HCC)  Seasonal allergic rhinitis due to pollen  Diverticulosis  Essential hypertension - Plan: CBC with Differential/Platelet, Comprehensive metabolic panel  History of oropharyngeal cancer - Plan: CBC with Differential/Platelet, Comprehensive metabolic panel  Presbycusis of both ears  Hyponatremia - Plan: Comprehensive metabolic panel  Erectile dysfunction, unspecified erectile dysfunction type - Plan: sildenafil (VIAGRA) 100 MG tablet   recommended at least 30 minutes of aerobic activity at least 5 days/week; proper  Immunization recommendations discussed.  Colonoscopy recommendations reviewed. Discussed the use of Viagra and possible side effects with him.  Continue on present medications.  Medicare Attestation I have personally reviewed: The patient's medical and social history Their use of alcohol, tobacco or illicit drugs Their current medications and supplements The patient's functional ability including ADLs,fall risks, home safety risks, cognitive, and hearing and visual impairment Diet and physical activities Evidence for depression or mood disorders  The patient's weight, height, and BMI have been recorded in the chart.  I have made referrals, counseling, and provided education to the patient based on review of the above and I have provided the patient with a written personalized care plan for preventive services.      Jill Alexanders, MD   03/30/2019    Lee Barnes is a 68 y.o. male who presents for annual wellness visit and follow-up on chronic medical conditions.  He has the following concerns:   Immunizations and Health Maintenance Immunization History  Administered Date(s) Administered  . Influenza Split 10/07/2011, 09/23/2012  . Influenza, High Dose Seasonal PF 09/22/2018  . Pneumococcal Conjugate-13 11/19/2017  . Pneumococcal-Unspecified 08/18/2014  . Tdap 10/06/2014  . Zoster 12/14/2013   There are no preventive care reminders to display for this patient.  Last colonoscopy: 01-20-2014 Last PSA:11-19-2017 Dentist: 11-2018 Ophtho:03/2018 Exercise: walking   Other doctors caring for patient include: Dr. Margrett Rud is cardio. Dr. Mena Goes.  Advanced Directives: Does Patient Have a Medical Advance Directive?: No Would patient like information on creating a medical advance directive?: Yes (MAU/Ambulatory/Procedural Areas - Information given)  Depression screen:  See questionnaire below.     Depression screen [facility] 2/9 03/30/2019 11/19/2017 07/27/2015  Decreased Interest 0 0 0  Down, Depressed, Hopeless 0 0 0  PHQ - 2 Score 0 0 0    Fall Screen: See Questionaire below.   Fall Risk  03/30/2019 11/19/2017 07/27/2015  Falls in the past year? 0 No No    ADL screen:  See questionnaire below.  Functional Status Survey: Is the patient deaf or have difficulty hearing?: No Does the patient have difficulty seeing, even when wearing glasses/contacts?: No Does the patient have difficulty concentrating, remembering, or making decisions?: No Does the patient have difficulty walking or climbing stairs?: No Does the patient have difficulty dressing or bathing?: No Does the patient have difficulty doing errands alone such as visiting a doctor's office or shopping?: No   Review of Systems  Constitutional: -, -unexpected weight change, -anorexia, -fatigue Allergy: -sneezing, -itching,  -congestion Dermatology: denies changing moles, rash, lumps ENT: -runny nose, -ear pain, -sore throat,  Cardiology:  -chest pain, -palpitations, -orthopnea, Respiratory: -cough, -shortness of breath, -dyspnea on exertion, -wheezing,  Gastroenterology: -abdominal pain, -nausea, -vomiting, -diarrhea, -constipation, -dysphagia Hematology: -bleeding or bruising problems Musculoskeletal: -arthralgias, -myalgias, -joint swelling, -back pain, - Ophthalmology: -vision changes,  Urology: -dysuria, -difficulty urinating,  -urinary frequency, -urgency, incontinence Neurology: -, -numbness, , -memory loss, -falls, -dizziness    PHYSICAL EXAM:  BP (!) 158/90 (BP Location: Left Arm, Patient Position: Sitting)   Pulse 86   Temp 98.2 F (36.8 C)   Ht 6\' 1"  (1.854 m)   Wt 170 lb 6.4 oz (77.3 kg)   SpO2 97%   BMI 22.48 kg/m   General Appearance: Alert, cooperative, no distress, appears stated age Head: Normocephalic, without obvious abnormality, atraumatic Eyes: PERRL, conjunctiva/corneas clear, EOM's intact, fundi benign Ears: Normal TM's and external ear canals Nose: Nares normal, mucosa normal, no drainage or sinus   tenderness Throat: Lips, mucosa, and tongue normal; teeth and gums normal Neck: Supple, no lymphadenopathy, thyroid:no enlargement/tenderness/nodules; no carotid bruit or JVD Lungs: Clear to auscultation bilaterally without wheezes, rales or ronchi; respirations unlabored Heart: Regular rate and rhythm, S1 and S2 normal, no murmur, rub or gallop Abdomen: Soft, non-tender, nondistended, normoactive bowel sounds, no masses, no hepatosplenomegaly Extremities: No clubbing, cyanosis or edema Pulses: 2+ and symmetric all extremities Skin: Skin color, texture, turgor normal, no rashes or lesions Lymph nodes: Cervical, supraclavicular, and axillary nodes normal Neurologic: CNII-XII intact, normal strength, sensation and gait; reflexes 2+ and symmetric throughout   Psych: Normal mood,  affect, hygiene and grooming  ASSESSMENT/PLAN:    Discussed PSA screening (risks/benefits), recommended at least 30 minutes of aerobic activity at least 5 days/week; proper sunscreen use reviewed; healthy diet and alcohol recommendations (less than or equal to 2 drinks/day) reviewed; regular seatbelt use; changing batteries in smoke detectors. Immunization recommendations discussed.  Colonoscopy recommendations reviewed.   Medicare Attestation I have personally reviewed: The patient's medical and social history Their use of alcohol, tobacco or illicit drugs Their current medications and supplements The patient's functional ability including ADLs,fall risks, home safety risks, cognitive, and hearing and visual impairment Diet and physical activities Evidence for depression or mood disorders  The patient's weight, height, and BMI have been recorded in the chart.  I have made referrals, counseling, and provided education to the patient based on review of the above and I have provided the patient with a written personalized care plan for preventive services.     Jill Alexanders, MD   03/30/2019

## 2019-03-30 NOTE — Patient Instructions (Signed)
  Lee Barnes , Thank you for taking time to come for your Medicare Wellness Visit. I appreciate your ongoing commitment to your health goals. Please review the following plan we discussed and let me know if I can assist you in the future.   These are the goals we discussed: Work on getting the shingles injections. This is a list of the screening recommended for you and due dates:  Health Maintenance  Topic Date Due  . Flu Shot  06/19/2019  . Pneumonia vaccines (2 of 2 - PPSV23) 08/19/2019  . Colon Cancer Screening  01/21/2024  . Tetanus Vaccine  10/06/2024  .  Hepatitis C: One time screening is recommended by Center for Disease Control  (CDC) for  adults born from [birth year] through 1965.   Completed

## 2019-04-15 ENCOUNTER — Ambulatory Visit: Payer: Medicare Other | Admitting: Family Medicine

## 2019-04-18 ENCOUNTER — Other Ambulatory Visit: Payer: Self-pay | Admitting: Family Medicine

## 2019-04-18 DIAGNOSIS — J3089 Other allergic rhinitis: Secondary | ICD-10-CM

## 2019-04-20 DIAGNOSIS — H2511 Age-related nuclear cataract, right eye: Secondary | ICD-10-CM | POA: Diagnosis not present

## 2019-04-20 DIAGNOSIS — H25011 Cortical age-related cataract, right eye: Secondary | ICD-10-CM | POA: Diagnosis not present

## 2019-04-20 DIAGNOSIS — H25013 Cortical age-related cataract, bilateral: Secondary | ICD-10-CM | POA: Diagnosis not present

## 2019-04-20 DIAGNOSIS — H2513 Age-related nuclear cataract, bilateral: Secondary | ICD-10-CM | POA: Diagnosis not present

## 2019-04-28 DIAGNOSIS — H25812 Combined forms of age-related cataract, left eye: Secondary | ICD-10-CM | POA: Diagnosis not present

## 2019-04-28 DIAGNOSIS — H2511 Age-related nuclear cataract, right eye: Secondary | ICD-10-CM | POA: Diagnosis not present

## 2019-04-28 DIAGNOSIS — H25011 Cortical age-related cataract, right eye: Secondary | ICD-10-CM | POA: Diagnosis not present

## 2019-05-05 DIAGNOSIS — H2512 Age-related nuclear cataract, left eye: Secondary | ICD-10-CM | POA: Diagnosis not present

## 2019-05-05 DIAGNOSIS — H25012 Cortical age-related cataract, left eye: Secondary | ICD-10-CM | POA: Diagnosis not present

## 2019-05-19 DIAGNOSIS — H9190 Unspecified hearing loss, unspecified ear: Secondary | ICD-10-CM | POA: Diagnosis not present

## 2019-05-19 DIAGNOSIS — N400 Enlarged prostate without lower urinary tract symptoms: Secondary | ICD-10-CM | POA: Diagnosis not present

## 2019-05-19 DIAGNOSIS — I129 Hypertensive chronic kidney disease with stage 1 through stage 4 chronic kidney disease, or unspecified chronic kidney disease: Secondary | ICD-10-CM | POA: Diagnosis not present

## 2019-05-19 DIAGNOSIS — N182 Chronic kidney disease, stage 2 (mild): Secondary | ICD-10-CM | POA: Diagnosis not present

## 2019-05-19 DIAGNOSIS — C109 Malignant neoplasm of oropharynx, unspecified: Secondary | ICD-10-CM | POA: Diagnosis not present

## 2019-05-19 DIAGNOSIS — Z9221 Personal history of antineoplastic chemotherapy: Secondary | ICD-10-CM | POA: Diagnosis not present

## 2019-05-19 DIAGNOSIS — Z79899 Other long term (current) drug therapy: Secondary | ICD-10-CM | POA: Diagnosis not present

## 2019-05-19 DIAGNOSIS — I951 Orthostatic hypotension: Secondary | ICD-10-CM | POA: Diagnosis not present

## 2019-05-19 DIAGNOSIS — Z923 Personal history of irradiation: Secondary | ICD-10-CM | POA: Diagnosis not present

## 2019-05-19 DIAGNOSIS — E871 Hypo-osmolality and hyponatremia: Secondary | ICD-10-CM | POA: Diagnosis not present

## 2019-05-23 ENCOUNTER — Other Ambulatory Visit: Payer: Self-pay | Admitting: Family Medicine

## 2019-05-27 DIAGNOSIS — M272 Inflammatory conditions of jaws: Secondary | ICD-10-CM | POA: Diagnosis not present

## 2019-05-27 DIAGNOSIS — Y842 Radiological procedure and radiotherapy as the cause of abnormal reaction of the patient, or of later complication, without mention of misadventure at the time of the procedure: Secondary | ICD-10-CM | POA: Diagnosis not present

## 2019-06-25 DIAGNOSIS — R590 Localized enlarged lymph nodes: Secondary | ICD-10-CM | POA: Diagnosis not present

## 2019-06-28 DIAGNOSIS — Z923 Personal history of irradiation: Secondary | ICD-10-CM | POA: Diagnosis not present

## 2019-06-28 DIAGNOSIS — N189 Chronic kidney disease, unspecified: Secondary | ICD-10-CM | POA: Diagnosis not present

## 2019-06-28 DIAGNOSIS — I129 Hypertensive chronic kidney disease with stage 1 through stage 4 chronic kidney disease, or unspecified chronic kidney disease: Secondary | ICD-10-CM | POA: Diagnosis not present

## 2019-06-28 DIAGNOSIS — Z85819 Personal history of malignant neoplasm of unspecified site of lip, oral cavity, and pharynx: Secondary | ICD-10-CM | POA: Diagnosis not present

## 2019-06-28 DIAGNOSIS — R55 Syncope and collapse: Secondary | ICD-10-CM | POA: Diagnosis not present

## 2019-06-28 DIAGNOSIS — Z9221 Personal history of antineoplastic chemotherapy: Secondary | ICD-10-CM | POA: Diagnosis not present

## 2019-06-30 ENCOUNTER — Other Ambulatory Visit: Payer: Self-pay | Admitting: Otolaryngology

## 2019-06-30 DIAGNOSIS — R221 Localized swelling, mass and lump, neck: Secondary | ICD-10-CM

## 2019-07-12 ENCOUNTER — Ambulatory Visit
Admission: RE | Admit: 2019-07-12 | Discharge: 2019-07-12 | Disposition: A | Discharge status: Home / Self Care | Payer: Medicare Other | Source: Ambulatory Visit | Attending: Otolaryngology | Admitting: Otolaryngology

## 2019-07-12 DIAGNOSIS — R221 Localized swelling, mass and lump, neck: Secondary | ICD-10-CM

## 2019-07-12 DIAGNOSIS — E041 Nontoxic single thyroid nodule: Secondary | ICD-10-CM | POA: Diagnosis not present

## 2019-07-12 MED ORDER — IOPAMIDOL (ISOVUE-300) INJECTION 61%
75.0000 mL | Freq: Once | INTRAVENOUS | Status: AC | PRN
  Administered 2019-07-12: 75 mL via INTRAVENOUS

## 2019-08-23 DIAGNOSIS — Z23 Encounter for immunization: Secondary | ICD-10-CM | POA: Diagnosis not present

## 2019-09-09 DIAGNOSIS — Z85828 Personal history of other malignant neoplasm of skin: Secondary | ICD-10-CM | POA: Diagnosis not present

## 2019-09-09 DIAGNOSIS — D485 Neoplasm of uncertain behavior of skin: Secondary | ICD-10-CM | POA: Diagnosis not present

## 2019-09-09 DIAGNOSIS — L570 Actinic keratosis: Secondary | ICD-10-CM | POA: Diagnosis not present

## 2019-09-09 DIAGNOSIS — L821 Other seborrheic keratosis: Secondary | ICD-10-CM | POA: Diagnosis not present

## 2019-09-09 DIAGNOSIS — Z961 Presence of intraocular lens: Secondary | ICD-10-CM | POA: Diagnosis not present

## 2019-09-14 DIAGNOSIS — M21622 Bunionette of left foot: Secondary | ICD-10-CM | POA: Diagnosis not present

## 2019-09-14 DIAGNOSIS — M21621 Bunionette of right foot: Secondary | ICD-10-CM | POA: Diagnosis not present

## 2019-09-14 DIAGNOSIS — M71572 Other bursitis, not elsewhere classified, left ankle and foot: Secondary | ICD-10-CM | POA: Diagnosis not present

## 2019-09-14 DIAGNOSIS — M71571 Other bursitis, not elsewhere classified, right ankle and foot: Secondary | ICD-10-CM | POA: Diagnosis not present

## 2019-10-12 DIAGNOSIS — M21622 Bunionette of left foot: Secondary | ICD-10-CM | POA: Diagnosis not present

## 2019-10-12 DIAGNOSIS — M71571 Other bursitis, not elsewhere classified, right ankle and foot: Secondary | ICD-10-CM | POA: Diagnosis not present

## 2019-10-12 DIAGNOSIS — M71572 Other bursitis, not elsewhere classified, left ankle and foot: Secondary | ICD-10-CM | POA: Diagnosis not present

## 2019-10-12 DIAGNOSIS — M21621 Bunionette of right foot: Secondary | ICD-10-CM | POA: Diagnosis not present

## 2019-10-28 ENCOUNTER — Other Ambulatory Visit: Payer: Self-pay | Admitting: Family Medicine

## 2019-10-28 DIAGNOSIS — J3089 Other allergic rhinitis: Secondary | ICD-10-CM

## 2019-11-05 ENCOUNTER — Other Ambulatory Visit: Payer: Self-pay | Admitting: Family Medicine

## 2019-11-25 DIAGNOSIS — N189 Chronic kidney disease, unspecified: Secondary | ICD-10-CM | POA: Diagnosis not present

## 2019-11-25 DIAGNOSIS — M272 Inflammatory conditions of jaws: Secondary | ICD-10-CM | POA: Diagnosis not present

## 2019-11-25 DIAGNOSIS — I10 Essential (primary) hypertension: Secondary | ICD-10-CM | POA: Diagnosis not present

## 2019-12-07 DIAGNOSIS — Z23 Encounter for immunization: Secondary | ICD-10-CM | POA: Diagnosis not present

## 2019-12-31 ENCOUNTER — Ambulatory Visit: Payer: Medicare Other

## 2020-01-03 DIAGNOSIS — I10 Essential (primary) hypertension: Secondary | ICD-10-CM | POA: Diagnosis not present

## 2020-01-03 DIAGNOSIS — Y842 Radiological procedure and radiotherapy as the cause of abnormal reaction of the patient, or of later complication, without mention of misadventure at the time of the procedure: Secondary | ICD-10-CM | POA: Diagnosis not present

## 2020-01-03 DIAGNOSIS — M272 Inflammatory conditions of jaws: Secondary | ICD-10-CM | POA: Diagnosis not present

## 2020-01-03 DIAGNOSIS — R0989 Other specified symptoms and signs involving the circulatory and respiratory systems: Secondary | ICD-10-CM | POA: Diagnosis not present

## 2020-01-03 DIAGNOSIS — R55 Syncope and collapse: Secondary | ICD-10-CM | POA: Diagnosis not present

## 2020-01-03 DIAGNOSIS — N189 Chronic kidney disease, unspecified: Secondary | ICD-10-CM | POA: Diagnosis not present

## 2020-01-03 DIAGNOSIS — T84418A Breakdown (mechanical) of other internal orthopedic devices, implants and grafts, initial encounter: Secondary | ICD-10-CM | POA: Diagnosis not present

## 2020-01-04 DIAGNOSIS — Z23 Encounter for immunization: Secondary | ICD-10-CM | POA: Diagnosis not present

## 2020-01-15 DIAGNOSIS — Z20822 Contact with and (suspected) exposure to covid-19: Secondary | ICD-10-CM | POA: Diagnosis not present

## 2020-01-15 DIAGNOSIS — Z01812 Encounter for preprocedural laboratory examination: Secondary | ICD-10-CM | POA: Diagnosis not present

## 2020-01-18 DIAGNOSIS — I10 Essential (primary) hypertension: Secondary | ICD-10-CM | POA: Diagnosis not present

## 2020-01-18 DIAGNOSIS — Y842 Radiological procedure and radiotherapy as the cause of abnormal reaction of the patient, or of later complication, without mention of misadventure at the time of the procedure: Secondary | ICD-10-CM | POA: Diagnosis not present

## 2020-01-18 DIAGNOSIS — E871 Hypo-osmolality and hyponatremia: Secondary | ICD-10-CM | POA: Diagnosis not present

## 2020-01-18 DIAGNOSIS — M8448XA Pathological fracture, other site, initial encounter for fracture: Secondary | ICD-10-CM | POA: Diagnosis not present

## 2020-01-18 DIAGNOSIS — I951 Orthostatic hypotension: Secondary | ICD-10-CM | POA: Diagnosis not present

## 2020-01-18 DIAGNOSIS — M264 Malocclusion, unspecified: Secondary | ICD-10-CM | POA: Diagnosis not present

## 2020-01-18 DIAGNOSIS — Z9221 Personal history of antineoplastic chemotherapy: Secondary | ICD-10-CM | POA: Diagnosis not present

## 2020-01-18 DIAGNOSIS — Z885 Allergy status to narcotic agent status: Secondary | ICD-10-CM | POA: Diagnosis not present

## 2020-01-18 DIAGNOSIS — T84298A Other mechanical complication of internal fixation device of other bones, initial encounter: Secondary | ICD-10-CM | POA: Diagnosis not present

## 2020-01-18 DIAGNOSIS — M272 Inflammatory conditions of jaws: Secondary | ICD-10-CM | POA: Diagnosis not present

## 2020-01-18 DIAGNOSIS — Z85818 Personal history of malignant neoplasm of other sites of lip, oral cavity, and pharynx: Secondary | ICD-10-CM | POA: Diagnosis not present

## 2020-01-18 DIAGNOSIS — Z923 Personal history of irradiation: Secondary | ICD-10-CM | POA: Diagnosis not present

## 2020-01-18 DIAGNOSIS — T84418A Breakdown (mechanical) of other internal orthopedic devices, implants and grafts, initial encounter: Secondary | ICD-10-CM | POA: Diagnosis not present

## 2020-01-19 DIAGNOSIS — M8448XA Pathological fracture, other site, initial encounter for fracture: Secondary | ICD-10-CM | POA: Diagnosis not present

## 2020-01-19 DIAGNOSIS — M264 Malocclusion, unspecified: Secondary | ICD-10-CM | POA: Diagnosis not present

## 2020-01-19 DIAGNOSIS — M272 Inflammatory conditions of jaws: Secondary | ICD-10-CM | POA: Diagnosis not present

## 2020-01-19 DIAGNOSIS — E871 Hypo-osmolality and hyponatremia: Secondary | ICD-10-CM | POA: Diagnosis not present

## 2020-01-19 DIAGNOSIS — Z9221 Personal history of antineoplastic chemotherapy: Secondary | ICD-10-CM | POA: Diagnosis not present

## 2020-01-19 DIAGNOSIS — T84298A Other mechanical complication of internal fixation device of other bones, initial encounter: Secondary | ICD-10-CM | POA: Diagnosis not present

## 2020-02-07 ENCOUNTER — Other Ambulatory Visit: Payer: Self-pay | Admitting: Family Medicine

## 2020-02-21 ENCOUNTER — Other Ambulatory Visit: Payer: Self-pay

## 2020-02-21 DIAGNOSIS — J3089 Other allergic rhinitis: Secondary | ICD-10-CM

## 2020-02-21 MED ORDER — AMLODIPINE BESYLATE 5 MG PO TABS: 5.0000 mg | ORAL_TABLET | Freq: Every day | ORAL | 0 refills | Status: DC

## 2020-02-21 MED ORDER — FLUTICASONE PROPIONATE 50 MCG/ACT NA SUSP: NASAL | 1 refills | Status: DC

## 2020-02-21 NOTE — Progress Notes (Signed)
Patient called and requested refills be sent to a different pharmacy. [facility] Barnes in [place], [place]

## 2020-03-24 ENCOUNTER — Telehealth: Payer: Self-pay | Admitting: Family Medicine

## 2020-03-24 NOTE — Telephone Encounter (Signed)
Check with him and see how he is doing.  Someone should follow up on that last Friday

## 2020-03-24 NOTE — Telephone Encounter (Signed)
Pt called and states [place] around and his vision got real blurry, and he thinks he got dehydrated pt come home and drank some Gatorade and states he feels better and the vision is better  But pt is wanting to know what you think if you think he needs to come in if it could be low sodium or if something else is wrong, pt can be reached at [phone] informed pt that dr Redmond School was out of the office, please send back to someone else as I will not be in the office this afternoon

## 2020-03-27 NOTE — Telephone Encounter (Signed)
I called to check on the pt. And he said he is doing fine now, he thinks he just got dehydrated felt better when he went home Friday and drank some Gatorade.

## 2020-03-31 ENCOUNTER — Ambulatory Visit (INDEPENDENT_AMBULATORY_CARE_PROVIDER_SITE_OTHER): Payer: Medicare Other | Admitting: Family Medicine

## 2020-03-31 ENCOUNTER — Other Ambulatory Visit: Payer: Self-pay

## 2020-03-31 VITALS — BP 170/92 | HR 87 | Temp 97.7°F | Ht 72.0 in | Wt 168.6 lb

## 2020-03-31 DIAGNOSIS — J301 Allergic rhinitis due to pollen: Secondary | ICD-10-CM | POA: Diagnosis not present

## 2020-03-31 DIAGNOSIS — G903 Multi-system degeneration of the autonomic nervous system: Secondary | ICD-10-CM | POA: Diagnosis not present

## 2020-03-31 DIAGNOSIS — Z13220 Encounter for screening for lipoid disorders: Secondary | ICD-10-CM | POA: Diagnosis not present

## 2020-03-31 DIAGNOSIS — I10 Essential (primary) hypertension: Secondary | ICD-10-CM

## 2020-03-31 DIAGNOSIS — H9113 Presbycusis, bilateral: Secondary | ICD-10-CM

## 2020-03-31 DIAGNOSIS — Z0000 Encounter for general adult medical examination without abnormal findings: Secondary | ICD-10-CM | POA: Diagnosis not present

## 2020-03-31 DIAGNOSIS — N529 Male erectile dysfunction, unspecified: Secondary | ICD-10-CM | POA: Diagnosis not present

## 2020-03-31 DIAGNOSIS — K5790 Diverticulosis of intestine, part unspecified, without perforation or abscess without bleeding: Secondary | ICD-10-CM

## 2020-03-31 DIAGNOSIS — Z85819 Personal history of malignant neoplasm of unspecified site of lip, oral cavity, and pharynx: Secondary | ICD-10-CM | POA: Diagnosis not present

## 2020-03-31 MED ORDER — AMLODIPINE BESYLATE 5 MG PO TABS: 5.0000 mg | ORAL_TABLET | Freq: Every day | ORAL | 0 refills | Status: DC

## 2020-03-31 NOTE — Patient Instructions (Signed)
  [name] , Thank you for taking time to come for your Medicare Wellness Visit. I appreciate your ongoing commitment to your health goals. Please review the following plan we discussed and let me know if I can assist you in the future.   These are the goals we discussed: Goals   None     This is a list of the screening recommended for you and due dates:  Health Maintenance  Topic Date Due  . Pneumonia vaccines (2 of 2 - PPSV23) 08/19/2019  . Flu Shot  06/18/2020  . Colon Cancer Screening  01/21/2024  . Tetanus Vaccine  10/06/2024  . COVID-19 Vaccine  Completed  .  Hepatitis C: One time screening is recommended by Center for Disease Control  (CDC) for  adults born from [birth year] through 1965.   Completed

## 2020-03-31 NOTE — Progress Notes (Signed)
Lee Barnes is a 69 y.o. male who presents for annual wellness visit,CPE and follow-up on chronic medical conditions.  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  He recently had some hardware replaced in his jaw.  He seems to be doing well from that.  He has a history of neurogenic orthostatic hypotension.  He did have an episode of this recently and does know how to take care of it.  He does continue on Norvasc for his underlying essential hypertension.  He has had no difficulty with erectile dysfunction recently.  Allergies seem to be under good control on his present medication regimen.  He does have a history of diverticulosis and recognizes what symptoms to watch out for concerning diverticulitis.  He does use hearing aids but does have some underlying tinnitus.  Family and social history was reviewed.  He is now retired and does have several grandchildren.   Immunizations and Health Maintenance Immunization History  Administered Date(s) Administered  . Influenza Split 10/07/2011, 09/23/2012  . Influenza, High Dose Seasonal PF 09/22/2018  . Pneumococcal Conjugate-13 11/19/2017  . Pneumococcal-Unspecified 08/18/2014  . Tdap 10/06/2014  . Zoster 12/14/2013   Health Maintenance Due  Topic Date Due  . PNA vac Low Risk Adult (2 of 2 - PPSV23) 08/19/2019    Last colonoscopy: 01/07/2008 Last PSA: 11/19/17 Dentist: $/8/21 Ophtho: 09/09/19 Exercise:walking 30 min  Other doctors caring for patient include: Dr. Smith Mince nephrology, Dr. Margrett Rud cardiology , Dr. Mena Goes.  Advanced Directives: Not on file Does Patient Have a Medical Advance Directive?: No Would patient like information on creating a medical advance directive?: Yes (MAU/Ambulatory/Procedural Areas - Information given)  Depression screen:  See questionnaire below.     Depression screen [facility] 2/9 03/31/2020 03/30/2019 11/19/2017 07/27/2015  Decreased Interest 0 0 0 0  Down, Depressed, Hopeless 0 0 0 0  PHQ - 2  Score 0 0 0 0    Fall Screen: See Questionaire below.   Fall Risk  03/31/2020 03/30/2019 11/19/2017 07/27/2015  Falls in the past year? 0 0 No No    ADL screen:  See questionnaire below.  Functional Status Survey: Is the patient deaf or have difficulty hearing?: Yes Does the patient have difficulty seeing, even when wearing glasses/contacts?: No Does the patient have difficulty concentrating, remembering, or making decisions?: No Does the patient have difficulty walking or climbing stairs?: No Does the patient have difficulty dressing or bathing?: No Does the patient have difficulty doing errands alone such as visiting a doctor's office or shopping?: No   Review of Systems  Constitutional: -, -unexpected weight change, -anorexia, -fatigue  Dermatology: denies changing moles, rash, lumps ENT: -runny nose, -ear pain, -sore throat,  Cardiology:  -chest pain, -palpitations, -orthopnea, Respiratory: -cough, -shortness of breath, -dyspnea on exertion, -wheezing,  Gastroenterology: -abdominal pain, -nausea, -vomiting, -diarrhea, -constipation, -dysphagia Hematology: -bleeding or bruising problems Musculoskeletal: -arthralgias, -myalgias, -joint swelling, -back pain, - Ophthalmology: -vision changes,  Urology: -dysuria, -difficulty urinating,  -urinary frequency, -urgency, incontinence Neurology: -, -numbness, , -memory loss, -falls, -dizziness    PHYSICAL EXAM:  BP (!) 170/92   Pulse 87   Temp 97.7 F (36.5 C)   Ht 6' (1.829 m)   Wt 168 lb 9.6 oz (76.5 kg)   SpO2 98%   BMI 22.87 kg/m   General Appearance: Alert, cooperative, no distress, appears stated age Head: Normocephalic, without obvious abnormality, atraumatic Eyes: PERRL, conjunctiva/corneas clear, EOM's intact, Ears: Hearing aids in place.   Nose: Nares normal, mucosa  normal, no drainage or sinus   tenderness Throat: Lips, mucosa, and tongue normal; teeth and gums normal Neck: Firmness noted on the right neck area no  lymphadenopathy, thyroid:no enlargement/tenderness/nodules; no carotid bruit or JVD Lungs: Clear to auscultation bilaterally without wheezes, rales or ronchi; respirations unlabored Heart: Regular rate and rhythm, S1 and S2 normal, no murmur, rub or gallop Abdomen: Soft, non-tender, nondistended, normoactive bowel sounds, no masses, no hepatosplenomegaly  Skin: Skin color, texture, turgor normal, no rashes or lesions Lymph nodes: Cervical, supraclavicular, and axillary nodes normal Neurologic: CNII-XII intact, normal strength, sensation and gait; reflexes 2+ and symmetric throughout   Psych: Normal mood, affect, hygiene and grooming  ASSESSMENT/PLAN: Routine general medical examination at a health care facility - Plan: CBC with Differential/Platelet, Comprehensive metabolic panel, Lipid panel  Presbycusis of both ears  History of oropharyngeal cancer  Neurogenic orthostatic hypotension (HCC)  Seasonal allergic rhinitis due to pollen  Essential hypertension - Plan: CBC with Differential/Platelet, Comprehensive metabolic panel, amLODipine (NORVASC) 5 MG tablet  Diverticulosis  Erectile dysfunction, unspecified erectile dysfunction type  Screening for lipid disorders - Plan: Lipid panel Overall he seems to be doing quite nicely.  He will continue on his present medications.  He is taking very few.  I did recommend he add a multivitamin to his regimen. Discussed PSA screening (risks/benefits),  Immunization recommendations discussed.  Colonoscopy recommendations reviewed.   Medicare Attestation I have personally reviewed: The patient's medical and social history Their use of alcohol, tobacco or illicit drugs Their current medications and supplements The patient's functional ability including ADLs,fall risks, home safety risks, cognitive, and hearing and visual impairment Diet and physical activities Evidence for depression or mood disorders  The patient's weight, height, and BMI  have been recorded in the chart.  I have made referrals, counseling, and provided education to the patient based on review of the above and I have provided the patient with a written personalized care plan for preventive services.     Jill Alexanders, MD   03/31/2020

## 2020-04-01 LAB — CBC WITH DIFFERENTIAL/PLATELET
Basophils Absolute: 0 10*3/uL (ref 0.0–0.2)
Basos: 1 %
EOS (ABSOLUTE): 0.2 10*3/uL (ref 0.0–0.4)
Eos: 3 %
Hematocrit: 37.3 % — ABNORMAL LOW (ref 37.5–51.0)
Hemoglobin: 12.5 g/dL — ABNORMAL LOW (ref 13.0–17.7)
Immature Grans (Abs): 0.1 10*3/uL (ref 0.0–0.1)
Immature Granulocytes: 1 %
Lymphocytes Absolute: 0.8 10*3/uL (ref 0.7–3.1)
Lymphs: 11 %
MCH: 27.7 pg (ref 26.6–33.0)
MCHC: 33.5 g/dL (ref 31.5–35.7)
MCV: 83 fL (ref 79–97)
Monocytes Absolute: 0.7 10*3/uL (ref 0.1–0.9)
Monocytes: 11 %
Neutrophils Absolute: 4.9 10*3/uL (ref 1.4–7.0)
Neutrophils: 73 %
Platelets: 321 10*3/uL (ref 150–450)
RBC: 4.52 x10E6/uL (ref 4.14–5.80)
RDW: 14.2 % (ref 11.6–15.4)
WBC: 6.7 10*3/uL (ref 3.4–10.8)

## 2020-04-01 LAB — COMPREHENSIVE METABOLIC PANEL
ALT: 18 IU/L (ref 0–44)
AST: 25 IU/L (ref 0–40)
Albumin/Globulin Ratio: 1.6 (ref 1.2–2.2)
Albumin: 4.8 g/dL (ref 3.8–4.8)
Alkaline Phosphatase: 150 IU/L — ABNORMAL HIGH (ref 39–117)
BUN/Creatinine Ratio: 17 (ref 10–24)
BUN: 19 mg/dL (ref 8–27)
Bilirubin Total: <0.2 mg/dL (ref 0.0–1.2)
CO2: 23 mmol/L (ref 20–29)
Calcium: 9.9 mg/dL (ref 8.6–10.2)
Chloride: 91 mmol/L — ABNORMAL LOW (ref 96–106)
Creatinine, Ser: 1.12 mg/dL (ref 0.76–1.27)
GFR calc Af Amer: 78 mL/min/{1.73_m2} (ref >59)
GFR calc non Af Amer: 67 mL/min/{1.73_m2} (ref >59)
Globulin, Total: 3 g/dL (ref 1.5–4.5)
Glucose: 92 mg/dL (ref 65–99)
Potassium: 5.6 mmol/L — ABNORMAL HIGH (ref 3.5–5.2)
Sodium: 129 mmol/L — ABNORMAL LOW (ref 134–144)
Total Protein: 7.8 g/dL (ref 6.0–8.5)

## 2020-04-01 LAB — LIPID PANEL
Chol/HDL Ratio: 2.9 ratio (ref 0.0–5.0)
Cholesterol, Total: 162 mg/dL (ref 100–199)
HDL: 55 mg/dL (ref >39)
LDL Chol Calc (NIH): 95 mg/dL (ref 0–99)
Triglycerides: 60 mg/dL (ref 0–149)
VLDL Cholesterol Cal: 12 mg/dL (ref 5–40)

## 2020-04-03 ENCOUNTER — Telehealth: Payer: Self-pay

## 2020-04-03 NOTE — Telephone Encounter (Signed)
This is probably a chronic position but as long as he is not having any weakness or fatigue or other issues he can probably wait

## 2020-04-03 NOTE — Telephone Encounter (Signed)
Pt. Called back to get rescheduled for labs per lab results was told to recheck Sodium and Potassium if you could put the order in.

## 2020-04-03 NOTE — Telephone Encounter (Signed)
Pt. Wife called back because she was concerned about his Potassium level and if he really needs to come in today to recheck that instead of waiting to next Monday which he is scheduled for now to recheck those levels. She did contact his Kidney doctor to and they said to contact us back, to see if he does need to come in sooner. He did not want to come in until next Monday because they  are in [place] baby sitting. His wife did say that he could come in today if necessary.

## 2020-04-03 NOTE — Telephone Encounter (Signed)
LVM for pt to call back . [place]

## 2020-04-04 NOTE — Telephone Encounter (Signed)
Pt was advised  and pt advised he talked to nephrology doctor as well. The kidney doctor advised  that pt see a nutritionist. Gave pt info about Lee Barnes [phone] and I would like to know I we need to send him to nutrition and diabetes. She thinks the levels were off due to his diet having to change since the jaw issue. Please advise . [name]

## 2020-04-10 ENCOUNTER — Other Ambulatory Visit: Payer: Medicare Other

## 2020-04-10 ENCOUNTER — Other Ambulatory Visit: Payer: Self-pay

## 2020-04-10 DIAGNOSIS — E875 Hyperkalemia: Secondary | ICD-10-CM | POA: Diagnosis not present

## 2020-04-10 LAB — BASIC METABOLIC PANEL
BUN/Creatinine Ratio: 16 (ref 10–24)
BUN: 16 mg/dL (ref 8–27)
CO2: 23 mmol/L (ref 20–29)
Calcium: 10.1 mg/dL (ref 8.6–10.2)
Chloride: 88 mmol/L — ABNORMAL LOW (ref 96–106)
Creatinine, Ser: 1 mg/dL (ref 0.76–1.27)
GFR calc Af Amer: 89 mL/min/{1.73_m2} (ref >59)
GFR calc non Af Amer: 77 mL/min/{1.73_m2} (ref >59)
Glucose: 97 mg/dL (ref 65–99)
Potassium: 5.9 mmol/L — ABNORMAL HIGH (ref 3.5–5.2)
Sodium: 141 mmol/L (ref 134–144)

## 2020-04-11 ENCOUNTER — Encounter: Payer: Self-pay | Admitting: Family Medicine

## 2020-04-12 ENCOUNTER — Telehealth: Payer: Self-pay | Admitting: Family Medicine

## 2020-04-12 DIAGNOSIS — E871 Hypo-osmolality and hyponatremia: Secondary | ICD-10-CM

## 2020-04-12 NOTE — Telephone Encounter (Signed)
Pt's wife called and requested we draw potassium level sbe drawn fro Dr. Jerene Pitch. Please advise pt and place orders in system. [phone]

## 2020-04-14 ENCOUNTER — Other Ambulatory Visit: Payer: Self-pay

## 2020-04-14 ENCOUNTER — Other Ambulatory Visit: Payer: Medicare Other

## 2020-04-14 DIAGNOSIS — E871 Hypo-osmolality and hyponatremia: Secondary | ICD-10-CM | POA: Diagnosis not present

## 2020-04-15 LAB — COMPREHENSIVE METABOLIC PANEL
ALT: 14 IU/L (ref 0–44)
AST: 22 IU/L (ref 0–40)
Albumin/Globulin Ratio: 1.4 (ref 1.2–2.2)
Albumin: 4.4 g/dL (ref 3.8–4.8)
Alkaline Phosphatase: 121 IU/L (ref 48–121)
BUN/Creatinine Ratio: 15 (ref 10–24)
BUN: 20 mg/dL (ref 8–27)
Bilirubin Total: 0.4 mg/dL (ref 0.0–1.2)
CO2: 24 mmol/L (ref 20–29)
Calcium: 9.5 mg/dL (ref 8.6–10.2)
Chloride: 91 mmol/L — ABNORMAL LOW (ref 96–106)
Creatinine, Ser: 1.33 mg/dL — ABNORMAL HIGH (ref 0.76–1.27)
GFR calc Af Amer: 63 mL/min/{1.73_m2} (ref >59)
GFR calc non Af Amer: 55 mL/min/{1.73_m2} — ABNORMAL LOW (ref >59)
Globulin, Total: 3.1 g/dL (ref 1.5–4.5)
Glucose: 108 mg/dL — ABNORMAL HIGH (ref 65–99)
Potassium: 4.5 mmol/L (ref 3.5–5.2)
Sodium: 128 mmol/L — ABNORMAL LOW (ref 134–144)
Total Protein: 7.5 g/dL (ref 6.0–8.5)

## 2020-04-18 ENCOUNTER — Other Ambulatory Visit: Payer: Medicare Other

## 2020-04-18 ENCOUNTER — Telehealth: Payer: Self-pay

## 2020-04-18 ENCOUNTER — Other Ambulatory Visit: Payer: Self-pay

## 2020-04-18 DIAGNOSIS — E871 Hypo-osmolality and hyponatremia: Secondary | ICD-10-CM | POA: Diagnosis not present

## 2020-04-18 NOTE — Telephone Encounter (Signed)
Pt. Wife called wanting to know if he could come in today to recheck Potassium here had it checked last week at nephrology office and they wanted him to have it rechecked today. She said there office is in [place] so its a long drive for them from [facility].

## 2020-04-18 NOTE — Telephone Encounter (Signed)
Ok I will get him scheduled if you will put the order in.

## 2020-04-18 NOTE — Telephone Encounter (Signed)
I placed the order.

## 2020-04-18 NOTE — Telephone Encounter (Signed)
Have him come in 

## 2020-04-19 LAB — BASIC METABOLIC PANEL
BUN/Creatinine Ratio: 12 (ref 10–24)
BUN: 14 mg/dL (ref 8–27)
CO2: 22 mmol/L (ref 20–29)
Calcium: 9.3 mg/dL (ref 8.6–10.2)
Chloride: 89 mmol/L — ABNORMAL LOW (ref 96–106)
Creatinine, Ser: 1.21 mg/dL (ref 0.76–1.27)
GFR calc Af Amer: 71 mL/min/{1.73_m2} (ref >59)
GFR calc non Af Amer: 61 mL/min/{1.73_m2} (ref >59)
Glucose: 99 mg/dL (ref 65–99)
Potassium: 4.4 mmol/L (ref 3.5–5.2)
Sodium: 124 mmol/L — ABNORMAL LOW (ref 134–144)

## 2020-04-27 ENCOUNTER — Telehealth: Payer: Self-pay

## 2020-04-27 DIAGNOSIS — E871 Hypo-osmolality and hyponatremia: Secondary | ICD-10-CM

## 2020-04-27 NOTE — Telephone Encounter (Signed)
Pt. Called his kidney doctor wants to have his Potassium rechecked again. I got him scheduled for tomorrow afternoon if you could put the order in the computer for that.

## 2020-04-28 ENCOUNTER — Other Ambulatory Visit: Payer: Medicare Other

## 2020-04-28 ENCOUNTER — Other Ambulatory Visit: Payer: Self-pay

## 2020-04-28 DIAGNOSIS — E871 Hypo-osmolality and hyponatremia: Secondary | ICD-10-CM

## 2020-04-29 LAB — BASIC METABOLIC PANEL
BUN/Creatinine Ratio: 14 (ref 10–24)
BUN: 15 mg/dL (ref 8–27)
CO2: 21 mmol/L (ref 20–29)
Calcium: 9.1 mg/dL (ref 8.6–10.2)
Chloride: 93 mmol/L — ABNORMAL LOW (ref 96–106)
Creatinine, Ser: 1.07 mg/dL (ref 0.76–1.27)
GFR calc Af Amer: 82 mL/min/{1.73_m2} (ref >59)
GFR calc non Af Amer: 71 mL/min/{1.73_m2} (ref >59)
Glucose: 105 mg/dL — ABNORMAL HIGH (ref 65–99)
Potassium: 5.1 mmol/L (ref 3.5–5.2)
Sodium: 129 mmol/L — ABNORMAL LOW (ref 134–144)

## 2020-06-12 ENCOUNTER — Ambulatory Visit (INDEPENDENT_AMBULATORY_CARE_PROVIDER_SITE_OTHER): Payer: Medicare Other | Admitting: Otolaryngology

## 2020-06-12 ENCOUNTER — Encounter (INDEPENDENT_AMBULATORY_CARE_PROVIDER_SITE_OTHER): Payer: Self-pay | Admitting: Otolaryngology

## 2020-06-12 ENCOUNTER — Other Ambulatory Visit: Payer: Self-pay

## 2020-06-12 VITALS — Temp 97.7°F

## 2020-06-12 DIAGNOSIS — R490 Dysphonia: Secondary | ICD-10-CM

## 2020-06-12 DIAGNOSIS — Z85818 Personal history of malignant neoplasm of other sites of lip, oral cavity, and pharynx: Secondary | ICD-10-CM

## 2020-06-12 NOTE — Progress Notes (Signed)
HPI: Lee Barnes is a 69 y.o. male who returns today for evaluation of voice change and difficulty swallowing.  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  He has had a plate placed there previously several years ago and had to have it replaced after a another fracture in the past few months.  Since his last surgery in March of this year he lost his voice and his voice has been very raspy.  He questions whether he had damage to his voice from being intubated for for surgery to repair of the broken jaw.  Does not feel like food is going down as easily.  He has always had a dry mouth since radiation therapy. He has maintained his weight..  Past Medical History:  Diagnosis Date  . Allergy to environmental factors   . Complication of anesthesia    "vageled" after surgery -overnite stay  . Diverticulosis 2008  . Heart murmur    hx of in childhood   . History of chemotherapy   . History of radiation therapy 11/05/10-12/26/10   r base tongue, 7000 cGy 35 sessions  . Hypertension    Medication started in May 2016.  Marland Kitchen Oropharynx cancer ([place]) 09/2010  . Pneumonia    hx of 2012  . 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 Tinnitus    Past Surgical History:  Procedure Laterality Date  . APPENDECTOMY    . COLONOSCOPY    . INGUINAL HERNIA REPAIR Bilateral 03/30/2015   Procedure: LAPAROSCOPIC BILATERAL INGUINAL HERNIA REPAIR WITH MESH;  Surgeon: Coralie Keens, MD;  Location: [facility];  Service: General;  Laterality: Bilateral;  . INGUINAL HERNIA REPAIR Right 1960  . INGUINAL HERNIA REPAIR Bilateral 03/30/2015  . INGUINAL HERNIA REPAIR Left 07/11/2015  . INGUINAL HERNIA REPAIR N/A 07/11/2015   Procedure: REPAIR OF LEFT INGUINAL HERNIA WITH MESH;  Surgeon: Coralie Keens, MD;  Location: [place];  Service: General;   Laterality: N/A;  . INSERTION OF MESH N/A 07/11/2015   Procedure: INSERTION OF MESH;  Surgeon: Coralie Keens, MD;  Location: [place];  Service: General;  Laterality: N/A;  . LUMBAR [place]    . TONSILLECTOMY     Social History   Socioeconomic History  . Marital status: Married    Spouse name: Not on file  . Number of children: 3  . Years of education: Not on file  . Highest education level: Not on file  Occupational History  . Occupation: OWNER    Employer: [employer]  Tobacco Use  . Smoking status: Never Smoker  . Smokeless tobacco: Never Used  Substance and Sexual Activity  . Alcohol use: Yes    Comment: occas drinks a beer every few weeks   . Drug use: No    Comment:    . Sexual activity: Yes  Other Topics Concern  . Not on file  Social History Narrative  . Not on file   Social Determinants of Health   Financial Resource Strain:   . Difficulty of Paying Living Expenses:   Food Insecurity:   . Worried About Charity fundraiser in the Last Year:   . Arboriculturist in the Last Year:   Transportation Needs:   . Film/video editor (Medical):   Marland Kitchen Lack of Transportation (Non-Medical):   Physical Activity:   .  Days of Exercise per Week:   . Minutes of Exercise per Session:   Stress:   . Feeling of Stress :   Social Connections:   . Frequency of Communication with Friends and Family:   . Frequency of Social Gatherings with Friends and Family:   . Attends Religious Services:   . Active Member of Clubs or Organizations:   . Attends Archivist Meetings:   Marland Kitchen Marital Status:    Family History  Problem Relation Age of Onset  . Heart disease Father   . Cancer Father        throat ca  . Heart disease Mother   . Colon cancer Neg Hx    Allergies  Allergen Reactions  . Codeine Nausea And Vomiting   Prior to Admission medications   Medication Sig Start Date End Date Taking? Authorizing Provider  acetaminophen (TYLENOL) 500 MG tablet Take 500  mg by mouth every 5 hours   Yes [provider]  Amino Acids (AMINO ACID PO) Take 5 tablets by mouth 2 (two) times daily.   Yes [provider]  amLODipine (NORVASC) 5 MG tablet Take 1 tablet (5 mg total) by mouth daily. 03/31/20  Yes Denita Lung, MD  cetirizine (ZYRTEC) 10 MG tablet Take 10 mg by mouth at bedtime.    Yes [provider]  chlorhexidine (PERIDEX) 0.12 % solution 15 mLs by Mouth Rinse route 2 (two) times daily. 03/25/18  Yes [provider]  Cyanocobalamin (VITAMIN B-12 PO) Take 1 tablet by mouth daily.   Yes [provider]  feeding supplement (BOOST HIGH PROTEIN) LIQD Take 1 Container by mouth 2 (two) times daily between meals.    Yes [provider]  fluticasone (FLONASE) 50 MCG/ACT nasal spray SPRAY 2 SPRAYS INTO EACH NOSTRIL DAILY AS NEEDED 02/21/20  Yes Denita Lung, MD  ibuprofen (ADVIL,MOTRIN) 100 MG/5ML suspension Take 10 mLs by mouth every 6 (six) hours as needed for pain. 03/25/18  Yes [provider]  Magnesium Oxide (MAG-OX 400 PO) Take 400 mg by mouth daily.   Yes [provider]  NON FORMULARY "Men's Health" capsules: Take 1 capsule by mouth once time a day   Yes [provider]  ondansetron (ZOFRAN) 8 MG tablet Take 1 tablet (8 mg total) by mouth every 8 (eight) hours as needed for nausea or vomiting. 03/27/18  Yes Denita Lung, MD  sildenafil (VIAGRA) 100 MG tablet Take 0.5-1 tablets (50-100 mg total) by mouth daily as needed for erectile dysfunction. 03/30/19  Yes Denita Lung, MD  amoxicillin (AMOXIL) 250 MG/5ML suspension Take 10 mLs (500 mg total) by mouth 3 (three) times daily. Patient not taking: Reported on 06/12/2020 03/11/18   Mesner, Corene Cornea, MD  amoxicillin-clavulanate (AUGMENTIN) 875-125 MG tablet Take 1 tablet by mouth 2 (two) times daily. Patient not taking: Reported on 06/12/2020    [provider]     Positive ROS: Otherwise negative  All other systems have  been reviewed and were otherwise negative with the exception of those mentioned in the HPI and as above.  Physical Exam: Constitutional: Alert, well-appearing, no acute distress.  Mild hoarseness.  Patient has difficulty raising his voice and talking loudly. Ears: External ears without lesions or tenderness. Ear canals are clear bilaterally with intact, clear TMs.  Nasal: External nose without lesions. Septum midline with mild rhinitis and clear mucus discharge.  No signs of infection..  Clear nasal passages bilaterally. Oral: Lips and gums without lesions. Tongue  and palate mucosa without lesions. Posterior oropharynx clear.  Indirect laryngoscopy revealed a clear base of tongue hypopharynx and larynx.  Moderate supraglottic edema. Fiberoptic laryngoscopy was performed to the right nostril.  The nasopharynx was clear.  The base of tongue was clear.  He had moderate supraglottic edema with a small amount of clear mucus in the supraglottis.  He has a tendency to close his false cords when he speaks.  He had mild edema of the true vocal cords.  No vocal cord abnormalities noted and both cords had normal mobility. Neck: No palpable adenopathy or masses.  No palpable adenopathy in the neck. Respiratory: Breathing comfortably  Skin: No facial/neck lesions or rash noted.  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 Patient does have a tendency to close his false cords when speaking.  Plan: Would recommend further evaluation with speech therapy to see if they can help with his speech. Could consider possible barium swallow if he is having difficulty swallowing and losing weight.   Radene Journey, MD

## 2020-06-13 ENCOUNTER — Other Ambulatory Visit (INDEPENDENT_AMBULATORY_CARE_PROVIDER_SITE_OTHER): Payer: Self-pay

## 2020-06-13 DIAGNOSIS — R490 Dysphonia: Secondary | ICD-10-CM

## 2020-07-07 ENCOUNTER — Ambulatory Visit: Payer: Medicare Other | Attending: Otolaryngology

## 2020-07-07 ENCOUNTER — Other Ambulatory Visit: Payer: Self-pay

## 2020-07-07 DIAGNOSIS — R471 Dysarthria and anarthria: Secondary | ICD-10-CM | POA: Diagnosis not present

## 2020-07-07 DIAGNOSIS — R1312 Dysphagia, oropharyngeal phase: Secondary | ICD-10-CM | POA: Diagnosis not present

## 2020-07-07 DIAGNOSIS — R490 Dysphonia: Secondary | ICD-10-CM | POA: Diagnosis not present

## 2020-07-08 NOTE — Patient Instructions (Signed)
   Whenever you talk, think about opening your mouth in order to over-enunciate your speech like you saw me do today.

## 2020-07-08 NOTE — Therapy (Signed)
[facility] [address] [place], [place], [address] Phone: [phone]   Fax:  [phone]  Speech Language Pathology Evaluation  Patient Details  Name: Lee Barnes MRN: [id number] Date of Birth: [birth year] Referring Provider (SLP): Dillard Cannon, MD   Encounter Date: 07/07/2020   End of Session - 07/08/20 0038    Visit Number 1    Number of Visits 17    Date for SLP Re-Evaluation 10/05/20    SLP Start Time 0806    SLP Stop Time  0847    SLP Time Calculation (min) 41 min    Activity Tolerance Patient tolerated treatment well           Past Medical History:  Diagnosis Date  . Allergy to environmental factors   . Complication of anesthesia    "vageled" after surgery -overnite stay  . Diverticulosis 2008  . Heart murmur    hx of in childhood   . History of chemotherapy   . History of radiation therapy 11/05/10-12/26/10   r base tongue, 7000 cGy 35 sessions  . Hypertension    Medication started in May 2016.  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 Pneumonia    hx of 2012  . 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 Tinnitus     Past Surgical History:  Procedure Laterality Date  . APPENDECTOMY    . COLONOSCOPY    . INGUINAL HERNIA REPAIR Bilateral 03/30/2015   Procedure: LAPAROSCOPIC BILATERAL INGUINAL HERNIA REPAIR WITH MESH;  Surgeon: Abigail Miyamoto, MD;  [facility];  Service: General;  Laterality: Bilateral;  . INGUINAL HERNIA REPAIR Right 1960  . INGUINAL HERNIA REPAIR Bilateral 03/30/2015  . INGUINAL HERNIA REPAIR Left 07/11/2015  . INGUINAL HERNIA REPAIR N/A 07/11/2015   Procedure: REPAIR OF LEFT INGUINAL HERNIA WITH MESH;  Surgeon: Abigail Miyamoto, MD;  [facility];  Service: General;  Laterality: N/A;  . INSERTION OF MESH N/A 07/11/2015   Procedure: INSERTION OF MESH;  Surgeon: Abigail Miyamoto, MD;  Location: [facility];  Service: General;  Laterality: N/A;  . LUMBAR [facility]  SURGERY    . TONSILLECTOMY      There were no vitals filed for this visit.   Subjective Assessment - 07/08/20 0026    Subjective "My voice is more hoarse now than it was before." "I can't get loud with my voice anymore." "the right side of my lips don't move and it's numb. 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 Pt reports his voice was mildly hoarse and he had some mild dysphagia but it was manageable. After a jaw  revision sx in early 2021, hoarseness and swallowing declined. 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       Prior Functional Status   Cognitive/Linguistic Baseline Within functional limits      Cognition   Overall Cognitive Status Within Functional Limits for tasks assessed      Oral Motor/Sensory Function   Overall Oral Motor/Sensory Function Impaired    Labial ROM Reduced right   rt lower lip with minimal movement   Labial Symmetry Abnormal symmetry right    Labial Strength Reduced Right    Labial Sensation Reduced Right    Lingual ROM Within Functional Limits    Lingual Symmetry Within Functional Limits    Lingual Sensation Within Functional Limits    Lingual Coordination WFL    Facial Sensation --   reduced rt   Overall Oral Motor/Sensory Function Pt with minimal movement of  rt lower lip very likely due to removal or damage of facial nerve branch on rt side with jaw sx in early 2021      Motor Speech   Overall Motor Speech Impaired    Respiration Impaired   chest breather   Phonation Hoarse;Low vocal intensity   mid 60s dB conversation   Articulation Impaired    Level of Impairment Word    Intelligibility Intelligible    Effective Techniques Slow rate;Over-articulate    Phonation Impaired    Tension Present Neck    Pitch Low   80-130 Hz                          SLP Education - 07/08/20 0037    Education Details SLP to recommend MBSS and rationale, eval results    Person(s) Educated Patient    Methods Explanation;Demonstration;Verbal cues    Comprehension Verbalized understanding;Returned demonstration;Verbal cues required;Need further instruction            SLP Short Term Goals - 07/08/20 0043      SLP SHORT TERM GOAL #1   Title pt will follow any swallow precautions from a modified barium swallow with modified independence in 3 sessions    Time 4    Period Weeks   or 9 vists for all STGs   Status New      SLP SHORT TERM GOAL #2   Title pt will demo flow phonation exercises for HEP with rare min A    Time 4    Period Weeks    Status New      SLP SHORT TERM GOAL #3   Title pt will demonstrate dysarthria HEP for clarity and rt labial muscle strength with rare min A over two sessions    Time 4    Period Weeks    Status New      SLP SHORT TERM GOAL #4   Title pt will produce abdominal breathing in 16/20 sentence responses x2 sessions    Time 4    Period Weeks    Status New            SLP Long Term Goals - 07/08/20 0046      SLP LONG TERM GOAL #1   Title pt will report a better voice-related QOL than at evaluation    Time 8    Period Weeks   or 17 total visits, for all LTGs     SLP LONG TERM GOAL #2   Title pt will demonstrate dysarthria HEP for clarity and rt labial  muscle strength with rare min A over two  sessions    Time 8    Period Weeks    Status New      SLP LONG TERM GOAL #3   Title pt will demo HEP for reducing muscle tension dysphonia with rare min A in 3 sessions    Time 8    Period Weeks    Status New      SLP LONG TERM GOAL #4   Title pt will demonstrate incr'd speech volume average of upper 60s dB in 10 minutes mod complex conversation x2 sessions    Time 8    Period Weeks    Status New      SLP LONG TERM GOAL #5   Title pt will complete dysphagia HEP PRN with rare min A in 3 sessions    Time 8    Period Weeks    Status New            Plan - 07/08/20 0039    Clinical Impression Statement Pt presents with mod horase voice, mild dysarthria, and reported oral and pharyngeal dysphagia. MBSS will be recommended for pt. Pt expresses frustration re: his more severe hoarse voice and his decr in speech clarity since latest jaw surgery in early 2021. Voice related QOL measure was measured at 27 raw score ("fair" QOL). Skilled ST needed to improve pt's voice quality, speech intelligibility and improve swallow safety. SLP to recommend MBSS to referring MD.    Speech Therapy Frequency 2x / week    Duration --   8 weeks or 17 total sessions   Treatment/Interventions Aspiration precaution training;Pharyngeal strengthening exercises;Diet toleration management by SLP;Trials of upgraded texture/liquids;Internal/external aids;Compensatory strategies;Patient/family education;SLP instruction and feedback;Environmental controls    Potential to Achieve Goals Fair    Potential Considerations Severity of impairments;Previous level of function    Consulted and Agree with Plan of Care Patient           Patient will benefit from skilled therapeutic intervention in order to improve the following deficits and impairments:   Dysphagia, oropharyngeal phase  Dysarthria and anarthria  Voice hoarseness    Problem List Patient Active Problem List   Diagnosis Date Noted  . 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 ACE-inhibitor cough 05/25/2015  . Essential hypertension 03/21/2015  . Allergic rhinitis 12/14/2013  . ED (erectile dysfunction) 12/14/2013  . Diverticulosis   . History of oropharyngeal cancer 09/18/2010    [facility] ,MS, CCC-SLP  07/08/2020, 12:55 AM  [facility] [address] [place], [place], [address] Phone: [phone]   Fax:  [phone]  Name: Lee Barnes MRN: [id number] Date of Birth: 02-01-1951

## 2020-07-13 ENCOUNTER — Other Ambulatory Visit (INDEPENDENT_AMBULATORY_CARE_PROVIDER_SITE_OTHER): Payer: Self-pay

## 2020-07-13 DIAGNOSIS — R1310 Dysphagia, unspecified: Secondary | ICD-10-CM

## 2020-07-18 ENCOUNTER — Other Ambulatory Visit: Payer: Self-pay

## 2020-07-18 ENCOUNTER — Ambulatory Visit: Payer: Medicare Other

## 2020-07-18 DIAGNOSIS — R471 Dysarthria and anarthria: Secondary | ICD-10-CM

## 2020-07-18 DIAGNOSIS — R1312 Dysphagia, oropharyngeal phase: Secondary | ICD-10-CM | POA: Diagnosis not present

## 2020-07-18 DIAGNOSIS — R490 Dysphonia: Secondary | ICD-10-CM | POA: Diagnosis not present

## 2020-07-18 NOTE — Patient Instructions (Addendum)
  You have learned to push and strain to get your voice out, so we need to change that habit into easier and gentler use of your voice box.   Repeat each word/phrase/sentence 3 times with voice and blowing simultaneously:  Do these 5 times a day  - - Feel how relaxed your voice feels when you generate these words and sentences  Who Who knew You knew You whoo You knew who Who knew you Who Who Who Who knew who

## 2020-07-18 NOTE — Therapy (Signed)
07/18/20 1631    Clinical Impression Statement Pt presents with mod horase voice, mild dysarthria, and reported oral and pharyngeal dysphagia. MBSS was recommended for pt and ordered on Thursday however pt has not been called yet to schedule. Pt expresses frustration equal in dysarthria, voice, and dysphagia. See "skilled intervention" for more details of today session. Skilled ST needed to improve pt's voice quality, speech intelligibility and improve swallow safety. SLP to recommend MBSS to referring MD.    Speech Therapy Frequency 2x / week    Duration --   8 weeks or 17 total sessions   Treatment/Interventions Aspiration precaution training;Pharyngeal strengthening exercises;Diet toleration management by SLP;Trials of upgraded texture/liquids;Internal/external aids;Compensatory strategies;Patient/family education;SLP instruction and feedback;Environmental controls    Potential to Achieve Goals Fair    Potential Considerations Severity of impairments;Previous level of function    Consulted and Agree with Plan of Care Patient           Patient will benefit from skilled therapeutic intervention in order to improve the following deficits and impairments:   Voice hoarseness  Dysphagia, oropharyngeal phase  Dysarthria and anarthria    Problem List Patient Active Problem List   Diagnosis Date Noted  . Neurogenic orthostatic hypotension ([place]) 03/30/2019  . 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 ACE-inhibitor cough 05/25/2015  . Essential hypertension 03/21/2015  . Allergic rhinitis 12/14/2013  . ED (erectile dysfunction) 12/14/2013  . Diverticulosis   . History of oropharyngeal cancer 09/18/2010    [facility] ,[place]  07/18/2020, 4:33 PM  [facility] [address] [place], [place], [address] Phone: [phone]   Fax:  [phone]   Name: Lee Barnes MRN: [id number] Date of Birth: [birth year]  07/18/20 1631    Clinical Impression Statement Pt presents with mod horase voice, mild dysarthria, and reported oral and pharyngeal dysphagia. MBSS was recommended for pt and ordered on Thursday however pt has not been called yet to schedule. Pt expresses frustration equal in dysarthria, voice, and dysphagia. See "skilled intervention" for more details of today session. Skilled ST needed to improve pt's voice quality, speech intelligibility and improve swallow safety. SLP to recommend MBSS to referring MD.    Speech Therapy Frequency 2x / week    Duration --   8 weeks or 17 total sessions   Treatment/Interventions Aspiration precaution training;Pharyngeal strengthening exercises;Diet toleration management by SLP;Trials of upgraded texture/liquids;Internal/external aids;Compensatory strategies;Patient/family education;SLP instruction and feedback;Environmental controls    Potential to Achieve Goals Fair    Potential Considerations Severity of impairments;Previous level of function    Consulted and Agree with Plan of Care Patient           Patient will benefit from skilled therapeutic intervention in order to improve the following deficits and impairments:   Voice hoarseness  Dysphagia, oropharyngeal phase  Dysarthria and anarthria    Problem List Patient Active Problem List   Diagnosis Date Noted  . Neurogenic orthostatic hypotension ([place]) 03/30/2019  . 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 ACE-inhibitor cough 05/25/2015  . Essential hypertension 03/21/2015  . Allergic rhinitis 12/14/2013  . ED (erectile dysfunction) 12/14/2013  . Diverticulosis   . History of oropharyngeal cancer 09/18/2010    [facility]  07/18/2020, 4:33 PM  [place] [address] [place], [place], [address] Phone: [phone]   Fax:  [phone]   Name: Lee Barnes MRN: [id number] Date of Birth: [birth year]  [facility] [address] [place], [place], [address] Phone: [phone]   Fax:  [phone]  Speech Language Pathology Treatment  Patient Details  Name: Lee Barnes MRN: [id number] Date of Birth: 16-Apr-1951 Referring Provider (SLP): Melony Overly, MD   Encounter Date: 07/18/2020   End of Session - 07/18/20 1631    Visit Number 2    Number of Visits 17    Date for SLP Re-Evaluation 10/05/20    SLP Start Time 1320    SLP Stop Time  1400    SLP Time Calculation (min) 40 min    Activity Tolerance Patient tolerated treatment well           Past Medical History:  Diagnosis Date  . Allergy to environmental factors   . Complication of anesthesia    "vageled" after surgery -overnite stay  . Diverticulosis 2008  . Heart murmur    hx of in childhood   . History of chemotherapy   . History of radiation therapy 11/05/10-12/26/10   r base tongue, 7000 cGy 35 sessions  . Hypertension    Medication started in May 2016.  Marland Kitchen Oropharynx cancer ([place]) 09/2010  . Pneumonia    hx of 2012  . 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 Tinnitus     Past Surgical History:  Procedure Laterality Date  . APPENDECTOMY    . COLONOSCOPY    . INGUINAL HERNIA REPAIR Bilateral 03/30/2015   Procedure: LAPAROSCOPIC BILATERAL INGUINAL HERNIA REPAIR WITH MESH;  Surgeon: Coralie Keens, MD;  Location: [facility];  Service: General;  Laterality: Bilateral;  . INGUINAL HERNIA REPAIR Right 1960  . INGUINAL HERNIA REPAIR Bilateral 03/30/2015  . INGUINAL HERNIA REPAIR Left 07/11/2015  . INGUINAL HERNIA REPAIR N/A 07/11/2015   Procedure: REPAIR OF LEFT INGUINAL HERNIA WITH MESH;  Surgeon: Coralie Keens, MD;  Location: [place];  Service: General;  Laterality: N/A;  . INSERTION OF MESH N/A 07/11/2015   Procedure: INSERTION OF MESH;  Surgeon: Coralie Keens, MD;  Location: [place];  Service: General;  Laterality: N/A;  . LUMBAR [place]    . TONSILLECTOMY      There were no vitals filed for this visit.   Subjective Assessment - 07/18/20 1332    Subjective Pt relates that dysphagia, dysarthria and voice quality/strength are all equally furstrating for him.    Currently in Pain? No/denies                 ADULT SLP TREATMENT - 07/18/20 1333      General Information   Behavior/Cognition Alert;Cooperative      Treatment Provided   Treatment provided Cognitive-Linquistic      Cognitive-Linquistic Treatment   Treatment focused on Voice;Dysarthria    Skilled Treatment Modified was ordered on Thursday but pt has not heard about scheduling. SLP wonders if done correctly. Told pt to tell SLP when office calls to schedule. Abdominal breathing (AB) targeted today with pt - pt with decr'd awareness of chest vs. abdominal breathing - SLP chose not to press this practice for the time being, and focus on education of why AB is necessary. AFter this, SLP focused on what is occurring when pt strains and pushes voice (false vocal fold movement, per Dr. Lucia Gaskins). SLP introduced semi-occulded vocal tract exercises today including voicing and simultaneous airflow with /u/ words with one syllable progressing to 2 and 3 word phrases with one syllable words including liquids and /u/

## 2020-07-20 ENCOUNTER — Other Ambulatory Visit (HOSPITAL_COMMUNITY): Payer: Self-pay

## 2020-07-20 DIAGNOSIS — R1310 Dysphagia, unspecified: Secondary | ICD-10-CM

## 2020-07-20 DIAGNOSIS — R059 Cough, unspecified: Secondary | ICD-10-CM

## 2020-07-21 ENCOUNTER — Other Ambulatory Visit: Payer: Self-pay

## 2020-07-21 ENCOUNTER — Ambulatory Visit: Payer: Medicare Other | Attending: Otolaryngology

## 2020-07-21 DIAGNOSIS — R1310 Dysphagia, unspecified: Secondary | ICD-10-CM

## 2020-07-21 DIAGNOSIS — R471 Dysarthria and anarthria: Secondary | ICD-10-CM

## 2020-07-21 DIAGNOSIS — R490 Dysphonia: Secondary | ICD-10-CM

## 2020-07-21 NOTE — Patient Instructions (Signed)
Practice abodminal breathing laying down with a cup on your chest and on your abdomen and try to breath in such a way that ONLY the cup on your abdomen moves. 15 minutes, twice a day.

## 2020-07-21 NOTE — Therapy (Signed)
[facility] [address] [place], [place], [address] Phone: [phone]   Fax:  [phone]  Speech Language Pathology Treatment  Patient Details  Name: Lee Barnes MRN: [id number] Date of Birth: [birth year] Referring Provider (SLP): Dillard Cannon, MD   Encounter Date: 07/21/2020   End of Session - 07/21/20 1216    Visit Number 3    Number of Visits 17    Date for SLP Re-Evaluation 10/05/20    SLP Start Time 1151    SLP Stop Time  1231    SLP Time Calculation (min) 40 min    Activity Tolerance Patient tolerated treatment well           Past Medical History:  Diagnosis Date  . Allergy to environmental factors   . Complication of anesthesia    "vageled" after surgery -overnite stay  . Diverticulosis 2008  . Heart murmur    hx of in childhood   . History of chemotherapy   . History of radiation therapy 11/05/10-12/26/10   r base tongue, 7000 cGy 35 sessions  . Hypertension    Medication started in May 2016.  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 Pneumonia    hx of 2012  . 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 Tinnitus     Past Surgical History:  Procedure Laterality Date  . APPENDECTOMY    . COLONOSCOPY    . INGUINAL HERNIA REPAIR Bilateral 03/30/2015   Procedure: LAPAROSCOPIC BILATERAL INGUINAL HERNIA REPAIR WITH MESH;  Surgeon: Abigail Miyamoto, MD;  Location: [facility];  Service: General;  Laterality: Bilateral;  . INGUINAL HERNIA REPAIR Right 1960  . INGUINAL HERNIA REPAIR Bilateral 03/30/2015  . INGUINAL HERNIA REPAIR Left 07/11/2015  . INGUINAL HERNIA REPAIR N/A 07/11/2015   Procedure: REPAIR OF LEFT INGUINAL HERNIA WITH MESH;  Surgeon: Abigail Miyamoto, MD;  Location: [facility] OR;  Service: General;  Laterality: N/A;  . INSERTION OF MESH N/A 07/11/2015   Procedure: INSERTION OF MESH;  Surgeon: Abigail Miyamoto, MD;  Location: [facility] OR;  Service: General;  Laterality: N/A;  . LUMBAR DISC  SURGERY    . TONSILLECTOMY      There were no vitals filed for this visit.   Subjective Assessment - 07/21/20 1154    Subjective Pt states that he's frustrated with the lack of volume of his voice.    Currently in Pain? No/denies                 ADULT SLP TREATMENT - 07/21/20 1155      General Information   Behavior/Cognition Alert;Cooperative;Pleasant mood      Treatment Provided   Treatment provided Cognitive-Linquistic      Cognitive-Linquistic Treatment   Treatment focused on Voice;Dysarthria    Skilled Treatment Modified barium swallow exam set up for 08-03-20. Due to "s" SLP focused on abdomoinal breating (AB) with pt today. in Therapy room , pt unable to isolate AB and cont'd with chest breathing. Therefore SLP took pt to gym adn used visual cues on abdomen and chest to differentiate between chest/AB. Over 5 minute pt success with AB improved from 25% to 60%. Vrebal cues of "feel your breath come alll the way to your gluts" was helpful for pt. SLP indicated that SLP may need more diagnostic info about the nature of the vocal fold vibration that Dr. Ezzard Standing unable to provide (laryngovideostroboscopy), in order to more effectively treat pt. Pt told SLP to cont to perform  semi-occuluded vocal tract exercises for next 4 weeks and we would revisit this later.      Assessment / Recommendations / Plan   Plan Continue with current plan of care      Progression Toward Goals   Progression toward goals Progressing toward goals            SLP Education - 07/21/20 1259    Education Details abdom            SLP Short Term Goals - 07/21/20 1301      SLP SHORT TERM GOAL #1   Title pt will follow any swallow precautions from a modified barium swallow with modified independence in 3 sessions    Time 4    Period Weeks   or 9 vists for all STGs   Status On-going      SLP SHORT TERM GOAL #2   Title pt will demo flow phonation exercises for HEP with rare min A    Time 4     Period Weeks    Status On-going      SLP SHORT TERM GOAL #3   Title pt will demonstrate dysarthria HEP for clarity and rt labial muscle strength with rare min A over two sessions    Time 4    Period Weeks    Status On-going      SLP SHORT TERM GOAL #4   Title pt will produce abdominal breathing in 16/20 sentence responses x2 sessions    Time 4    Period Weeks    Status On-going            SLP Long Term Goals - 07/21/20 1302      SLP LONG TERM GOAL #1   Title pt will report a better voice-related QOL than at evaluation    Time 8    Period Weeks   or 17 total visits, for all LTGs   Status On-going      SLP LONG TERM GOAL #2   Title pt will demonstrate dysarthria HEP for clarity and rt labial muscle strength with rare min A over two sessions    Time 8    Period Weeks    Status On-going      SLP LONG TERM GOAL #3   Title pt will demo HEP for reducing muscle tension dysphonia with rare min A in 3 sessions    Time 8    Period Weeks    Status On-going      SLP LONG TERM GOAL #4   Title pt will demonstrate incr'd speech volume average of upper 60s dB in 10 minutes mod complex conversation x2 sessions    Time 8    Period Weeks    Status On-going      SLP LONG TERM GOAL #5   Title pt will complete dysphagia HEP PRN with rare min A in 3 sessions    Time 8    Period Weeks    Status On-going            Plan - 07/21/20 1301    Clinical Impression Statement Pt presents with mod horase voice, mild dysarthria including decr'd voice volume, and reported oral and pharyngeal dysphagia. MBSS scheduled for 08-03-20. Pt expresses frustration equal in dysarthria, voice, and dysphagia. See "skilled intervention" for more details of today session. Skilled ST needed to improve pt's voice quality, speech intelligibility and improve swallow safety. SLP to recommend MBSS to referring MD.    Speech Therapy Frequency 2x /  week    Duration --   8 weeks or 17 total sessions    Treatment/Interventions Aspiration precaution training;Pharyngeal strengthening exercises;Diet toleration management by SLP;Trials of upgraded texture/liquids;Internal/external aids;Compensatory strategies;Patient/family education;SLP instruction and feedback;Environmental controls    Potential to Achieve Goals Fair    Potential Considerations Severity of impairments;Previous level of function    Consulted and Agree with Plan of Care Patient           Patient will benefit from skilled therapeutic intervention in order to improve the following deficits and impairments:   Dysphagia, unspecified type  Dysarthria and anarthria  Voice hoarseness    Problem List Patient Active Problem List   Diagnosis Date Noted  . 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 ACE-inhibitor cough 05/25/2015  . Essential hypertension 03/21/2015  . Allergic rhinitis 12/14/2013  . ED (erectile dysfunction) 12/14/2013  . Diverticulosis   . History of oropharyngeal cancer 09/18/2010    [facility] ,MS, CCC-SLP  07/21/2020, 1:02 PM  [facility] [address] [place], [place], [address] Phone: [phone]   Fax:  [phone]   Name: Lee Barnes MRN: [id number] Date of Birth: Jun 01, 1951

## 2020-07-25 DIAGNOSIS — R030 Elevated blood-pressure reading, without diagnosis of hypertension: Secondary | ICD-10-CM | POA: Diagnosis not present

## 2020-07-25 DIAGNOSIS — I10 Essential (primary) hypertension: Secondary | ICD-10-CM | POA: Diagnosis not present

## 2020-07-25 DIAGNOSIS — N189 Chronic kidney disease, unspecified: Secondary | ICD-10-CM | POA: Diagnosis not present

## 2020-07-26 ENCOUNTER — Other Ambulatory Visit: Payer: Medicare Other

## 2020-07-26 ENCOUNTER — Ambulatory Visit: Payer: Medicare Other

## 2020-07-26 ENCOUNTER — Other Ambulatory Visit: Payer: Self-pay

## 2020-07-26 DIAGNOSIS — R030 Elevated blood-pressure reading, without diagnosis of hypertension: Secondary | ICD-10-CM | POA: Diagnosis not present

## 2020-07-26 DIAGNOSIS — R471 Dysarthria and anarthria: Secondary | ICD-10-CM | POA: Diagnosis not present

## 2020-07-26 DIAGNOSIS — R490 Dysphonia: Secondary | ICD-10-CM | POA: Diagnosis not present

## 2020-07-26 DIAGNOSIS — N189 Chronic kidney disease, unspecified: Secondary | ICD-10-CM | POA: Diagnosis not present

## 2020-07-26 DIAGNOSIS — I10 Essential (primary) hypertension: Secondary | ICD-10-CM

## 2020-07-26 DIAGNOSIS — R1310 Dysphagia, unspecified: Secondary | ICD-10-CM

## 2020-07-26 NOTE — Patient Instructions (Signed)
Practice the abdominal breathing every day, twice, for 15 minutes.   Swallow more frequently when you talk and this should partly normalize your articulation.

## 2020-07-26 NOTE — Therapy (Signed)
[facility] [address] [place], [place], [address] Phone: [phone]   Fax:  [phone]  Speech Language Pathology Treatment  Patient Details  Name: Lee Barnes MRN: [id number] Date of Birth: [birth year] Referring Provider (SLP): Dillard Cannon, MD   Encounter Date: 07/26/2020   End of Session - 07/26/20 1220    Visit Number 4    Number of Visits 17    Date for SLP Re-Evaluation 10/05/20    SLP Start Time 1021    SLP Stop Time  1100    SLP Time Calculation (min) 39 min    Activity Tolerance Patient tolerated treatment well           Past Medical History:  Diagnosis Date  . Allergy to environmental factors   . Complication of anesthesia    "vageled" after surgery -overnite stay  . Diverticulosis 2008  . Heart murmur    hx of in childhood   . History of chemotherapy   . History of radiation therapy 11/05/10-12/26/10   r base tongue, 7000 cGy 35 sessions  . Hypertension    Medication started in May 2016.  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 Pneumonia    hx of 2012  . 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 Tinnitus     Past Surgical History:  Procedure Laterality Date  . APPENDECTOMY    . COLONOSCOPY    . INGUINAL HERNIA REPAIR Bilateral 03/30/2015   Procedure: LAPAROSCOPIC BILATERAL INGUINAL HERNIA REPAIR WITH MESH;  Surgeon: Abigail Miyamoto, MD;  [facility];  Service: General;  Laterality: Bilateral;  . INGUINAL HERNIA REPAIR Right 1960  . INGUINAL HERNIA REPAIR Bilateral 03/30/2015  . INGUINAL HERNIA REPAIR Left 07/11/2015  . INGUINAL HERNIA REPAIR N/A 07/11/2015   Procedure: REPAIR OF LEFT INGUINAL HERNIA WITH MESH;  Surgeon: Abigail Miyamoto, MD;  Location: [facility] OR;  Service: General;  Laterality: N/A;  . INSERTION OF MESH N/A 07/11/2015   Procedure: INSERTION OF MESH;  Surgeon: Abigail Miyamoto, MD;  Location[facility] OR;  Service: General;  Laterality: N/A;  . LUMBAR DISC  SURGERY    . TONSILLECTOMY      There were no vitals filed for this visit.   Subjective Assessment - 07/26/20 1026    Subjective "First I had to find a place to do it. I just laid down in the room."    Currently in Pain? No/denies                 ADULT SLP TREATMENT - 07/26/20 1026      General Information   Behavior/Cognition Alert;Cooperative;Pleasant mood      Treatment Provided   Treatment provided Cognitive-Linquistic      Cognitive-Linquistic Treatment   Treatment focused on Voice;Dysarthria    Skilled Treatment Pt reported when he practiced abdominal breathing (AB) he thought hard about it and was more successful with feeling it done. Got a couple days where he did AB twice - SLP told pt to put it into his schedule and then he will noteice it get easier to do during practice times and will start to notice it during the day as well. SLP and pt practiced overarticulation in conversational sentencnes - SLP ascertained that pt's posterior tongue does not spread like it should for /s/ and /z/ and this distorts sound; further, pt's decr'd saliva management makes the sound "slushy" and exacerbates the distortion. SLP strongly encouraged pt to swallow after each utterance  for better management of saliva. Pt agreed with more frequent swallowing that his articulation seemed better than without the swallowing due to better saliva management      Assessment / Recommendations / Plan   Plan Continue with current plan of care      Progression Toward Goals   Progression toward goals Progressing toward goals            SLP Education - 07/26/20 1220    Education Details abdominal breathing practice necessary 15 minutes twice each day    Person(s) Educated Patient    Methods Explanation;Demonstration;Verbal cues    Comprehension Verbalized understanding;Returned demonstration;Need further instruction            SLP Short Term Goals - 07/26/20 1221      SLP SHORT TERM GOAL #1    Title pt will follow any swallow precautions from a modified barium swallow with modified independence in 3 sessions    Time 3    Period Weeks   or 9 vists for all STGs   Status On-going      SLP SHORT TERM GOAL #2   Title pt will demo flow phonation exercises for HEP with rare min A    Time 3    Period Weeks    Status On-going      SLP SHORT TERM GOAL #3   Title pt will demonstrate dysarthria HEP for clarity and rt labial muscle strength with rare min A over two sessions    Time 3    Period Weeks    Status On-going      SLP SHORT TERM GOAL #4   Title pt will produce abdominal breathing in 16/20 sentence responses x2 sessions    Time 3    Period Weeks    Status On-going            SLP Long Term Goals - 07/26/20 1222      SLP LONG TERM GOAL #1   Title pt will report a better voice-related QOL than at evaluation    Time 7    Period Weeks   or 17 total visits, for all LTGs   Status On-going      SLP LONG TERM GOAL #2   Title pt will demonstrate dysarthria HEP for clarity and rt labial muscle strength with rare min A over two sessions    Time 7    Period Weeks    Status On-going      SLP LONG TERM GOAL #3   Title pt will demo HEP for reducing muscle tension dysphonia with rare min A in 3 sessions    Time 7    Period Weeks    Status On-going      SLP LONG TERM GOAL #4   Title pt will demonstrate incr'd speech volume average of upper 60s dB in 10 minutes mod complex conversation x2 sessions    Time 7    Period Weeks    Status On-going      SLP LONG TERM GOAL #5   Title pt will complete dysphagia HEP PRN with rare min A in 3 sessions    Time 7    Period Weeks    Status On-going            Plan - 07/26/20 1221    Clinical Impression Statement Pt presents with mod horase voice, mild dysarthria including decr'd voice volume, and reported oral and pharyngeal dysphagia. MBSS scheduled for 08-03-20. Pt expresses frustration equal in dysarthria,  voice, and  dysphagia. See "skilled intervention" for more details of today session. Skilled ST needed to improve pt's voice quality, speech intelligibility and improve swallow safety. SLP to recommend MBSS to referring MD.    Speech Therapy Frequency 2x / week    Duration --   8 weeks or 17 total sessions   Treatment/Interventions Aspiration precaution training;Pharyngeal strengthening exercises;Diet toleration management by SLP;Trials of upgraded texture/liquids;Internal/external aids;Compensatory strategies;Patient/family education;SLP instruction and feedback;Environmental controls    Potential to Achieve Goals Fair    Potential Considerations Severity of impairments;Previous level of function    Consulted and Agree with Plan of Care Patient           Patient will benefit from skilled therapeutic intervention in order to improve the following deficits and impairments:   Dysarthria and anarthria  Voice hoarseness  Dysphagia, unspecified type    Problem List Patient Active Problem List   Diagnosis Date Noted  . 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 ACE-inhibitor cough 05/25/2015  . Essential hypertension 03/21/2015  . Allergic rhinitis 12/14/2013  . ED (erectile dysfunction) 12/14/2013  . Diverticulosis   . History of oropharyngeal cancer 09/18/2010    [facility] ,MS, CCC-SLP  07/26/2020, 12:22 PM  [facility] [address] [place], [place], [address] Phone: [phone]   Fax:  [phone]   Name: JANOAH SAVOCA MRN: [id number] Date of Birth: February 17, 1951

## 2020-07-27 LAB — POTASSIUM: Potassium: 5.4 mmol/L — ABNORMAL HIGH (ref 3.5–5.2)

## 2020-08-01 ENCOUNTER — Other Ambulatory Visit: Payer: Self-pay

## 2020-08-01 ENCOUNTER — Ambulatory Visit: Payer: Medicare Other

## 2020-08-01 DIAGNOSIS — R471 Dysarthria and anarthria: Secondary | ICD-10-CM

## 2020-08-01 DIAGNOSIS — R490 Dysphonia: Secondary | ICD-10-CM

## 2020-08-01 DIAGNOSIS — R1310 Dysphagia, unspecified: Secondary | ICD-10-CM

## 2020-08-01 NOTE — Patient Instructions (Addendum)
  Finding consistency in the exercises will give [place] a better idea of your prognosis.   Do these 5 times a day  - - Feel how relaxed your voice feels when you  generate these words and sentences with flow/relaxed speech  Who [place] knew Who knew [place] whoo You knew Emi Holes knew who Who knew you You whoo, Collie Siad Who knew the zoo Who Who Who Who knew who You knew the zoo

## 2020-08-01 NOTE — Therapy (Signed)
[facility] [address] [place], [place], [address] Phone: [phone]   Fax:  [phone]  Speech Language Pathology Treatment  Patient Details  Name: Lee Barnes MRN: [id number] Date of Birth: 20-Jan-1951 Referring Provider (SLP): Melony Overly, MD   Encounter Date: 08/01/2020   End of Session - 08/01/20 1637    Visit Number 5    Number of Visits 17    Date for SLP Re-Evaluation 10/05/20    SLP Start Time 38    SLP Stop Time  1615    SLP Time Calculation (min) 41 min    Activity Tolerance Patient tolerated treatment well           Past Medical History:  Diagnosis Date  . Allergy to environmental factors   . Complication of anesthesia    "vageled" after surgery -overnite stay  . Diverticulosis 2008  . Heart murmur    hx of in childhood   . History of chemotherapy   . History of radiation therapy 11/05/10-12/26/10   r base tongue, 7000 cGy 35 sessions  . Hypertension    Medication started in May 2016.  Marland Kitchen Oropharynx cancer ([place]) 09/2010  . Pneumonia    hx of 2012  . 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 Tinnitus     Past Surgical History:  Procedure Laterality Date  . APPENDECTOMY    . COLONOSCOPY    . INGUINAL HERNIA REPAIR Bilateral 03/30/2015   Procedure: LAPAROSCOPIC BILATERAL INGUINAL HERNIA REPAIR WITH MESH;  Surgeon: Coralie Keens, MD;  Location: [facility];  Service: General;  Laterality: Bilateral;  . INGUINAL HERNIA REPAIR Right 1960  . INGUINAL HERNIA REPAIR Bilateral 03/30/2015  . INGUINAL HERNIA REPAIR Left 07/11/2015  . INGUINAL HERNIA REPAIR N/A 07/11/2015   Procedure: REPAIR OF LEFT INGUINAL HERNIA WITH MESH;  Surgeon: Coralie Keens, MD;  Location: [facility];  Service: General;  Laterality: N/A;  . INSERTION OF MESH N/A 07/11/2015   Procedure: INSERTION OF MESH;  Surgeon: Coralie Keens, MD;  Location: [facility];  Service: General;  Laterality: N/A;  . LUMBAR [place]    . TONSILLECTOMY      There were no vitals filed for this visit.   Subjective Assessment - 08/01/20 1546    Subjective Pt has had dififculty with consistency in practicing breathing as well as flow/relaxed speech.    Currently in Pain? No/denies                 ADULT SLP TREATMENT - 08/01/20 1553      General Information   Behavior/Cognition Alert;Cooperative;Pleasant mood      Treatment Provided   Treatment provided Cognitive-Linquistic      Cognitive-Linquistic Treatment   Treatment focused on Voice;Dysarthria    Skilled Treatment After discussion with [name], SLP told pt that his articulatory pattern is likely permanent and that overarticulation was his best opportunity to preserve [facility] articulation. SLP worked with pt today on flow phonation trying to decr pt's "crackly" voice. SLP shaped pt productions into gentler phonation with h, n, z, s phonemes ith /u/ vowel (see pt instructions). Success = approx 70% without pressed voice. SLP strongly suggested pt add daily practice of abdominal breathing and the flow/relaxed phonation into his routine and then SLP will have better idea of pt's prognosis. Pt's MBSS scheduled for Thrusday if equipment malfunction is fixed.      Assessment / Recommendations / Plan   Plan Continue with current  and improve swallow safety. SLP to recommend MBSS to referring MD.    Speech Therapy Frequency 2x / week    Duration --   8 weeks or 17 total sessions   Treatment/Interventions Aspiration precaution training;Pharyngeal strengthening exercises;Diet toleration management by SLP;Trials of upgraded texture/liquids;Internal/external aids;Compensatory strategies;Patient/family education;SLP instruction and feedback;Environmental controls    Potential to Achieve Goals Fair    Potential Considerations Severity of impairments;Previous level of function    Consulted and Agree with Plan of Care Patient           Patient will benefit from skilled therapeutic intervention in order to improve the following deficits and impairments:   Voice hoarseness  Dysarthria and anarthria  Dysphagia, unspecified type    Problem List Patient Active Problem List   Diagnosis Date Noted  . Neurogenic orthostatic hypotension ([place]) 03/30/2019  . 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 ACE-inhibitor cough 05/25/2015  . Essential hypertension 03/21/2015  . Allergic rhinitis 12/14/2013  . ED (erectile dysfunction) 12/14/2013  . Diverticulosis   . History of oropharyngeal cancer 09/18/2010    [facility], [place]  08/01/2020, 4:38 PM  [facility] [address] [place], [place], [address] Phone: [phone]   Fax:  [phone]   Name: Lee Barnes MRN: [id number] Date of Birth: [birth year]  and improve swallow safety. SLP to recommend MBSS to referring MD.    Speech Therapy Frequency 2x / week    Duration --   8 weeks or 17 total sessions   Treatment/Interventions Aspiration precaution training;Pharyngeal strengthening exercises;Diet toleration management by SLP;Trials of upgraded texture/liquids;Internal/external aids;Compensatory strategies;Patient/family education;SLP instruction and feedback;Environmental controls    Potential to Achieve Goals Fair    Potential Considerations Severity of impairments;Previous level of function    Consulted and Agree with Plan of Care Patient           Patient will benefit from skilled therapeutic intervention in order to improve the following deficits and impairments:   Voice hoarseness  Dysarthria and anarthria  Dysphagia, unspecified type    Problem List Patient Active Problem List   Diagnosis Date Noted  . Neurogenic orthostatic hypotension ([place]) 03/30/2019  . 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 ACE-inhibitor cough 05/25/2015  . Essential hypertension 03/21/2015  . Allergic rhinitis 12/14/2013  . ED (erectile dysfunction) 12/14/2013  . Diverticulosis   . History of oropharyngeal cancer 09/18/2010    [facility], [place]  08/01/2020, 4:38 PM  [facility] [address] [place], [place], [address] Phone: [phone]   Fax:  [phone]   Name: Lee Barnes MRN: [id number] Date of Birth: [birth year]

## 2020-08-03 ENCOUNTER — Other Ambulatory Visit: Payer: Self-pay

## 2020-08-03 ENCOUNTER — Ambulatory Visit (HOSPITAL_COMMUNITY)
Admission: RE | Admit: 2020-08-03 | Discharge: 2020-08-03 | Disposition: A | Discharge status: Home / Self Care | Payer: Medicare Other | Source: Ambulatory Visit | Attending: Otolaryngology | Admitting: Otolaryngology

## 2020-08-03 DIAGNOSIS — R059 Cough, unspecified: Secondary | ICD-10-CM

## 2020-08-03 DIAGNOSIS — R1310 Dysphagia, unspecified: Secondary | ICD-10-CM

## 2020-08-03 DIAGNOSIS — R05 Cough: Secondary | ICD-10-CM | POA: Insufficient documentation

## 2020-08-04 ENCOUNTER — Ambulatory Visit: Payer: Medicare Other

## 2020-08-04 DIAGNOSIS — R1310 Dysphagia, unspecified: Secondary | ICD-10-CM | POA: Diagnosis not present

## 2020-08-04 DIAGNOSIS — R490 Dysphonia: Secondary | ICD-10-CM | POA: Diagnosis not present

## 2020-08-04 DIAGNOSIS — R471 Dysarthria and anarthria: Secondary | ICD-10-CM | POA: Diagnosis not present

## 2020-08-04 NOTE — Therapy (Signed)
[facility] [address] [place], [place], [address] Phone: [phone]   Fax:  [phone]  Speech Language Pathology Treatment  Patient Details  Name: Lee Barnes MRN: [id number] Date of Birth: January 22, 1951 Referring Provider (SLP): Dillard Cannon, MD   Encounter Date: 08/04/2020   End of Session - 08/04/20 1635    Visit Number 6    Number of Visits 17    Date for SLP Re-Evaluation 10/05/20    SLP Start Time 1451    SLP Stop Time  1532    SLP Time Calculation (min) 41 min    Activity Tolerance Patient tolerated treatment well           Past Medical History:  Diagnosis Date  . Allergy to environmental factors   . Complication of anesthesia    "vageled" after surgery -overnite stay  . Diverticulosis 2008  . Heart murmur    hx of in childhood   . History of chemotherapy   . History of radiation therapy 11/05/10-12/26/10   r base tongue, 7000 cGy 35 sessions  . Hypertension    Medication started in May 2016.  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 Pneumonia    hx of 2012  . 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 Tinnitus     Past Surgical History:  Procedure Laterality Date  . APPENDECTOMY    . COLONOSCOPY    . INGUINAL HERNIA REPAIR Bilateral 03/30/2015   Procedure: LAPAROSCOPIC BILATERAL INGUINAL HERNIA REPAIR WITH MESH;  Surgeon: Abigail Miyamoto, MD;  Location: [facility];  Service: General;  Laterality: Bilateral;  . INGUINAL HERNIA REPAIR Right 1960  . INGUINAL HERNIA REPAIR Bilateral 03/30/2015  . INGUINAL HERNIA REPAIR Left 07/11/2015  . INGUINAL HERNIA REPAIR N/A 07/11/2015   Procedure: REPAIR OF LEFT INGUINAL HERNIA WITH MESH;  Surgeon: Abigail Miyamoto, MD;  Location: [facility];  Service: General;  Laterality: N/A;  . INSERTION OF MESH N/A 07/11/2015   Procedure: INSERTION OF MESH;  Surgeon: Abigail Miyamoto, MD;  Location: [facility];  Service: General;  Laterality: N/A;  . LUMBAR [facility]URGERY    . TONSILLECTOMY      There were no vitals filed for this visit.   Subjective Assessment - 08/04/20 1510    Subjective "I had my swallow test."    Currently in Pain? No/denies                 ADULT SLP TREATMENT - 08/04/20 1515      General Information   Behavior/Cognition Alert;Cooperative;Pleasant mood      Treatment Provided   Treatment provided Cognitive-Linquistic;Dysphagia      Dysphagia Treatment   Other treatment/comments SLP reviewed the modified (MBSS) results and recommendations from yesterday with [name]. Pt stated recommendations are much of what he is already doing with POs but learned it was helpful for him to "hock" and/or clear food and liquid during a meal and especially after POs. SLP shared reason for dysphagia as described on the MBSS (changes due to radiation tx in 2011-2012.      Cognitive-Linquistic Treatment   Treatment focused on Voice    Skilled Treatment (5 minutes) Pt knows where exercises are (semi-occluded) but has not followed frequency prescribed due to being busy. SLP reiterated to pt that following frequency recommendation was important to gte most benefit from the exercises.       Assessment / Recommendations / Plan   Plan Continue with current plan  of care      Dysphagia Recommendations   Diet recommendations Dysphagia 3 (mechanical soft);Thin liquid    Liquids provided via Cup    Compensations Slow rate;Small sips/bites;Multiple dry swallows after each bite/sip;Follow solids with liquid;Clear throat intermittently;Effortful swallow      Progression Toward Goals   Progression toward goals Not progressing toward goals (comment)   suboptimal completion of voice HEP           SLP Education - 08/04/20 1634    Education Details need to do voice and dysarthria HEP daily, results and recommendations from MBSS    Person(s) Educated Patient    Methods Explanation    Comprehension Verbalized understanding            SLP  Short Term Goals - 08/04/20 1637      SLP SHORT TERM GOAL #1   Title pt will follow any swallow precautions from a modified barium swallow with modified independence in 3 sessions    Time 2    Period Weeks   or 9 vists for all STGs   Status On-going      SLP SHORT TERM GOAL #2   Title pt will demo flow phonation exercises for HEP with rare min A    Time 2    Period Weeks    Status On-going      SLP SHORT TERM GOAL #3   Title pt will demonstrate dysarthria HEP for clarity and rt labial muscle strength with rare min A over two sessions    Time 2    Period Weeks    Status On-going      SLP SHORT TERM GOAL #4   Title pt will produce abdominal breathing in 16/20 sentence responses x2 sessions    Time 2    Period Weeks    Status On-going            SLP Long Term Goals - 08/04/20 1638      SLP LONG TERM GOAL #1   Title pt will report a better voice-related QOL than at evaluation    Time 6    Period Weeks   or 17 total visits, for all LTGs   Status On-going      SLP LONG TERM GOAL #2   Title pt will demonstrate dysarthria HEP for clarity and rt labial muscle strength with rare min A over two sessions    Time 6    Period Weeks    Status On-going      SLP LONG TERM GOAL #3   Title pt will demo HEP for reducing muscle tension dysphonia with rare min A in 3 sessions    Time 6    Period Weeks    Status On-going      SLP LONG TERM GOAL #4   Title pt will demonstrate incr'd speech volume average of upper 60s dB in 10 minutes mod complex conversation x2 sessions    Time 6    Period Weeks    Status On-going      SLP LONG TERM GOAL #5   Title pt will complete dysphagia HEP PRN with rare min A in 3 sessions    Time 6    Period Weeks    Status On-going            Plan - 08/04/20 1635    Clinical Impression Statement Pt presents with mod hoarse/harsh "crackly" voice, mild dysarthria including decr'd voice volume, and reported oral and pharyngeal dysphagia. MBSS completed  -  mild oral and and mod pharyngeal-cervical dysphagia - dys III/thin recommended with alternating bite/sip, effortful swallow, small bites/sips, slow rate, "hock" after POs. Intermittent throat clear also likely helpful for pt.Pt still not completing his voice HEP on a daily basis - SLP ?s pt's commitment to change. Pt expresses frustration equal in dysarthria, voice, and dysphagia. See "skilled intervention" for more details of today session. Skilled ST needed to improve pt's voice quality, speech intelligibility and improve swallow safety. SLP to recommend MBSS to referring MD.    Speech Therapy Frequency 2x / week    Duration --   8 weeks or 17 total sessions   Treatment/Interventions Aspiration precaution training;Pharyngeal strengthening exercises;Diet toleration management by SLP;Trials of upgraded texture/liquids;Internal/external aids;Compensatory strategies;Patient/family education;SLP instruction and feedback;Environmental controls    Potential to Achieve Goals Fair    Potential Considerations Severity of impairments;Previous level of function    Consulted and Agree with Plan of Care Patient           Patient will benefit from skilled therapeutic intervention in order to improve the following deficits and impairments:   Dysphagia, unspecified type  Dysarthria and anarthria  Voice hoarseness    Problem List Patient Active Problem List   Diagnosis Date Noted  . 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 ACE-inhibitor cough 05/25/2015  . Essential hypertension 03/21/2015  . Allergic rhinitis 12/14/2013  . ED (erectile dysfunction) 12/14/2013  . Diverticulosis   . History of oropharyngeal cancer 09/18/2010    [facility] ,MS, CCC-SLP  08/04/2020, 4:38 PM  [facility] [address] [place], [place], [address] Phone: [phone]   Fax:  [phone]   Name:  CAYLON GRUNOW MRN: [id number] Date of Birth: 1951-03-20

## 2020-08-04 NOTE — Patient Instructions (Signed)
   Important to complete exercises with the straw as prescribed to give yourself most benefit from reducing tension and tightness in your throat when you talk.

## 2020-08-07 ENCOUNTER — Ambulatory Visit: Payer: Medicare Other

## 2020-08-07 ENCOUNTER — Other Ambulatory Visit: Payer: Self-pay

## 2020-08-07 DIAGNOSIS — R490 Dysphonia: Secondary | ICD-10-CM

## 2020-08-07 DIAGNOSIS — R471 Dysarthria and anarthria: Secondary | ICD-10-CM | POA: Diagnosis not present

## 2020-08-07 DIAGNOSIS — R1310 Dysphagia, unspecified: Secondary | ICD-10-CM | POA: Diagnosis not present

## 2020-08-07 NOTE — Therapy (Signed)
[facility] [address] [place], [place], [address] Phone: [phone]   Fax:  [phone]  Speech Language Pathology Treatment  Patient Details  Name: Lee Barnes MRN: [id number] Date of Birth: [birth year] Referring Provider (SLP): Melony Overly, MD   Encounter Date: 08/07/2020   End of Session - 08/07/20 1727    Visit Number 7    Number of Visits 17    Date for SLP Re-Evaluation 10/05/20    SLP Start Time 7672    SLP Stop Time  1100    SLP Time Calculation (min) 37 min    Activity Tolerance Patient tolerated treatment well           Past Medical History:  Diagnosis Date  . Allergy to environmental factors   . Complication of anesthesia    "vageled" after surgery -overnite stay  . Diverticulosis 2008  . Heart murmur    hx of in childhood   . History of chemotherapy   . History of radiation therapy 11/05/10-12/26/10   r base tongue, 7000 cGy 35 sessions  . Hypertension    Medication started in May 2016.  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 Pneumonia    hx of 2012  . 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 Tinnitus     Past Surgical History:  Procedure Laterality Date  . APPENDECTOMY    . COLONOSCOPY    . INGUINAL HERNIA REPAIR Bilateral 03/30/2015   Procedure: LAPAROSCOPIC BILATERAL INGUINAL HERNIA REPAIR WITH MESH;  Surgeon: Coralie Keens, MD;  [facility]: [facility];  Service: General;  Laterality: Bilateral;  . INGUINAL HERNIA REPAIR Right 1960  . INGUINAL HERNIA REPAIR Bilateral 03/30/2015  . INGUINAL HERNIA REPAIR Left 07/11/2015  . INGUINAL HERNIA REPAIR N/A 07/11/2015   Procedure: REPAIR OF LEFT INGUINAL HERNIA WITH MESH;  Surgeon: Coralie Keens, MD;  Location: [facility];  Service: General;  Laterality: N/A;  . INSERTION OF MESH N/A 07/11/2015   Procedure: INSERTION OF MESH;  Surgeon: Coralie Keens, MD;  Location: [facility];  Service: General;  Laterality: N/A;  . LUMBAR [place]    . TONSILLECTOMY      There were no vitals filed for this visit.   Subjective Assessment - 08/07/20 1026    Subjective Pt's voice does not have the same strained/strangled quality as last week. "I'm thinking about it more," pt stated.    Currently in Pain? No/denies                 ADULT SLP TREATMENT - 08/07/20 1706      General Information   Behavior/Cognition Alert;Cooperative;Pleasant mood      Treatment Provided   Treatment provided Cognitive-Linquistic      Dysphagia Treatment   Other treatment/comments SLP reviewed pt's swallow precautions with him with applesauce. REq'd initial mod cues occasionally faded to independent.       Cognitive-Linquistic Treatment   Treatment focused on Voice    Skilled Treatment Pt's voice notably less strangled/strained today. Pt told SLP focusing more on more relaxed voice since last session. SLP reviewed pt's voice HEP with him. Pt req'd min A rarely for directing air and voice through mouth (with singing) instead of through nose. Pt independent after this cue from SLP/.      Assessment / Recommendations / Plan   Plan Continue with current plan of care      Progression Toward Goals   Progression toward goals Progressing toward  [facility] [address] [place], [place], [address] Phone: [phone]   Fax:  [phone]  Speech Language Pathology Treatment  Patient Details  Name: Lee Barnes MRN: [id number] Date of Birth: [birth year] Referring Provider (SLP): Melony Overly, MD   Encounter Date: 08/07/2020   End of Session - 08/07/20 1727    Visit Number 7    Number of Visits 17    Date for SLP Re-Evaluation 10/05/20    SLP Start Time 7672    SLP Stop Time  1100    SLP Time Calculation (min) 37 min    Activity Tolerance Patient tolerated treatment well           Past Medical History:  Diagnosis Date  . Allergy to environmental factors   . Complication of anesthesia    "vageled" after surgery -overnite stay  . Diverticulosis 2008  . Heart murmur    hx of in childhood   . History of chemotherapy   . History of radiation therapy 11/05/10-12/26/10   r base tongue, 7000 cGy 35 sessions  . Hypertension    Medication started in May 2016.  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 Pneumonia    hx of 2012  . 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 Tinnitus     Past Surgical History:  Procedure Laterality Date  . APPENDECTOMY    . COLONOSCOPY    . INGUINAL HERNIA REPAIR Bilateral 03/30/2015   Procedure: LAPAROSCOPIC BILATERAL INGUINAL HERNIA REPAIR WITH MESH;  Surgeon: Coralie Keens, MD;  [facility];  Service: General;  Laterality: Bilateral;  . INGUINAL HERNIA REPAIR Right 1960  . INGUINAL HERNIA REPAIR Bilateral 03/30/2015  . INGUINAL HERNIA REPAIR Left 07/11/2015  . INGUINAL HERNIA REPAIR N/A 07/11/2015   Procedure: REPAIR OF LEFT INGUINAL HERNIA WITH MESH;  Surgeon: Coralie Keens, MD;  Location: [facility];  Service: General;  Laterality: N/A;  . INSERTION OF MESH N/A 07/11/2015   Procedure: INSERTION OF MESH;  Surgeon: Coralie Keens, MD;  Location: [facility];  Service: General;  Laterality: N/A;  . LUMBAR [place]    . TONSILLECTOMY      There were no vitals filed for this visit.   Subjective Assessment - 08/07/20 1026    Subjective Pt's voice does not have the same strained/strangled quality as last week. "I'm thinking about it more," pt stated.    Currently in Pain? No/denies                 ADULT SLP TREATMENT - 08/07/20 1706      General Information   Behavior/Cognition Alert;Cooperative;Pleasant mood      Treatment Provided   Treatment provided Cognitive-Linquistic      Dysphagia Treatment   Other treatment/comments SLP reviewed pt's swallow precautions with him with applesauce. REq'd initial mod cues occasionally faded to independent.       Cognitive-Linquistic Treatment   Treatment focused on Voice    Skilled Treatment Pt's voice notably less strangled/strained today. Pt told SLP focusing more on more relaxed voice since last session. SLP reviewed pt's voice HEP with him. Pt req'd min A rarely for directing air and voice through mouth (with singing) instead of through nose. Pt independent after this cue from SLP/.      Assessment / Recommendations / Plan   Plan Continue with current plan of care      Progression Toward Goals   Progression toward goals Progressing toward  ST needed to improve pt's voice quality, speech intelligibility and improve swallow safety. SLP to recommend MBSS to referring MD.    Speech Therapy Frequency 1x /week    Duration --   8 weeks or 17 total sessions   Treatment/Interventions Aspiration precaution training;Pharyngeal strengthening exercises;Diet toleration management by SLP;Trials of upgraded texture/liquids;Internal/external aids;Compensatory strategies;Patient/family education;SLP instruction and feedback;Environmental controls    Potential to Achieve Goals Fair    Potential Considerations Severity of impairments;Previous level of function    Consulted and Agree with Plan of Care Patient           Patient will benefit from skilled therapeutic intervention in order to improve the following deficits and impairments:   Dysphagia, unspecified type  Dysarthria and anarthria  Voice hoarseness    Problem List Patient Active Problem List   Diagnosis Date Noted  . 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 ACE-inhibitor cough 05/25/2015  . Essential hypertension 03/21/2015  . Allergic rhinitis 12/14/2013  . ED (erectile dysfunction) 12/14/2013  . Diverticulosis   . History of oropharyngeal cancer 09/18/2010    [facility] ,[place], [place]  08/07/2020, 5:31 PM  [facility] [address] [place], [place], [address] Phone: [phone]   Fax:  [phone]   Name: Lee Barnes MRN: [id number] Date of Birth: [birth year]

## 2020-08-15 DIAGNOSIS — R0989 Other specified symptoms and signs involving the circulatory and respiratory systems: Secondary | ICD-10-CM | POA: Diagnosis not present

## 2020-08-15 DIAGNOSIS — E871 Hypo-osmolality and hyponatremia: Secondary | ICD-10-CM | POA: Diagnosis not present

## 2020-08-15 DIAGNOSIS — N182 Chronic kidney disease, stage 2 (mild): Secondary | ICD-10-CM | POA: Diagnosis not present

## 2020-08-15 DIAGNOSIS — E875 Hyperkalemia: Secondary | ICD-10-CM | POA: Diagnosis not present

## 2020-08-16 ENCOUNTER — Ambulatory Visit: Payer: Medicare Other

## 2020-08-16 ENCOUNTER — Other Ambulatory Visit: Payer: Self-pay

## 2020-08-16 DIAGNOSIS — R471 Dysarthria and anarthria: Secondary | ICD-10-CM | POA: Diagnosis not present

## 2020-08-16 DIAGNOSIS — R490 Dysphonia: Secondary | ICD-10-CM | POA: Diagnosis not present

## 2020-08-16 DIAGNOSIS — R1310 Dysphagia, unspecified: Secondary | ICD-10-CM | POA: Diagnosis not present

## 2020-08-16 NOTE — Therapy (Addendum)
[facility] [address] [place], [place], [address] Phone: [phone]   Fax:  [phone]  Speech Language Pathology Treatment  Patient Details  Name: Lee Barnes MRN: [id number] Date of Birth: 06/22/1951 Referring Provider (SLP): Melony Overly, MD   Encounter Date: 08/16/2020   End of Session - 08/16/20 1306    Visit Number 8    Number of Visits 17    Date for SLP Re-Evaluation 10/05/20    SLP Start Time 1150    SLP Stop Time  1229    SLP Time Calculation (min) 39 min    Activity Tolerance Patient tolerated treatment well           Past Medical History:  Diagnosis Date  . Allergy to environmental factors   . Complication of anesthesia    "vageled" after surgery -overnite stay  . Diverticulosis 2008  . Heart murmur    hx of in childhood   . History of chemotherapy   . History of radiation therapy 11/05/10-12/26/10   r base tongue, 7000 cGy 35 sessions  . Hypertension    Medication started in May 2016.  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 Pneumonia    hx of 2012  . 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 Tinnitus     Past Surgical History:  Procedure Laterality Date  . APPENDECTOMY    . COLONOSCOPY    . INGUINAL HERNIA REPAIR Bilateral 03/30/2015   Procedure: LAPAROSCOPIC BILATERAL INGUINAL HERNIA REPAIR WITH MESH;  Surgeon: Coralie Keens, MD;  [facility];  Service: General;  Laterality: Bilateral;  . INGUINAL HERNIA REPAIR Right 1960  . INGUINAL HERNIA REPAIR Bilateral 03/30/2015  . INGUINAL HERNIA REPAIR Left 07/11/2015  . INGUINAL HERNIA REPAIR N/A 07/11/2015   Procedure: REPAIR OF LEFT INGUINAL HERNIA WITH MESH;  Surgeon: Coralie Keens, MD;  Location: [facility];  Service: General;  Laterality: N/A;  . INSERTION OF MESH N/A 07/11/2015   Procedure: INSERTION OF MESH;  Surgeon: Coralie Keens, MD;  Location: [facility];  Service: General;  Laterality: N/A;  . LUMBAR [place]    . TONSILLECTOMY      There were no vitals filed for this visit.   Subjective Assessment - 08/16/20 1157    Subjective Pt's voice has remained without being characterized by harsh/strained voicing as in the past.    Currently in Pain? No/denies                 ADULT SLP TREATMENT - 08/16/20 1203      General Information   Behavior/Cognition Alert;Cooperative;Pleasant mood      Treatment Provided   Treatment provided Cognitive-Linquistic      Dysphagia Treatment   Other treatment/comments Pt was independent with his swallow precautions with a cereal bar and water. No HEP at this time due to deficits due to surgical changes and muscle fibrosis.      Cognitive-Linquistic Treatment   Treatment focused on Voice    Skilled Treatment Lee Barnes's voice produced today with minimal strained/strangled quality. He completed his HEP for voice with cues to reduce rate, focus on ensuring breath and voicing on each production, and have good breath prior to each production. SLP talked with pt about his plan of care - pt and SLP agreed that if pt cont to look this good or better next session that it will be d/c day on 08-30-20.       Assessment / Recommendations /  [facility] [address] [place], [place], [address] Phone: [phone]   Fax:  [phone]  Speech Language Pathology Treatment  Patient Details  Name: Lee Barnes MRN: [id number] Date of Birth: 06/22/1951 Referring Provider (SLP): Melony Overly, MD   Encounter Date: 08/16/2020   End of Session - 08/16/20 1306    Visit Number 8    Number of Visits 17    Date for SLP Re-Evaluation 10/05/20    SLP Start Time 1150    SLP Stop Time  1229    SLP Time Calculation (min) 39 min    Activity Tolerance Patient tolerated treatment well           Past Medical History:  Diagnosis Date  . Allergy to environmental factors   . Complication of anesthesia    "vageled" after surgery -overnite stay  . Diverticulosis 2008  . Heart murmur    hx of in childhood   . History of chemotherapy   . History of radiation therapy 11/05/10-12/26/10   r base tongue, 7000 cGy 35 sessions  . Hypertension    Medication started in May 2016.  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 Pneumonia    hx of 2012  . 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 Tinnitus     Past Surgical History:  Procedure Laterality Date  . APPENDECTOMY    . COLONOSCOPY    . INGUINAL HERNIA REPAIR Bilateral 03/30/2015   Procedure: LAPAROSCOPIC BILATERAL INGUINAL HERNIA REPAIR WITH MESH;  Surgeon: Coralie Keens, MD;  [facility];  Service: General;  Laterality: Bilateral;  . INGUINAL HERNIA REPAIR Right 1960  . INGUINAL HERNIA REPAIR Bilateral 03/30/2015  . INGUINAL HERNIA REPAIR Left 07/11/2015  . INGUINAL HERNIA REPAIR N/A 07/11/2015   Procedure: REPAIR OF LEFT INGUINAL HERNIA WITH MESH;  Surgeon: Coralie Keens, MD;  Location: [facility];  Service: General;  Laterality: N/A;  . INSERTION OF MESH N/A 07/11/2015   Procedure: INSERTION OF MESH;  Surgeon: Coralie Keens, MD;  Location: [facility];  Service: General;  Laterality: N/A;  . LUMBAR [place]    . TONSILLECTOMY      There were no vitals filed for this visit.   Subjective Assessment - 08/16/20 1157    Subjective Pt's voice has remained without being characterized by harsh/strained voicing as in the past.    Currently in Pain? No/denies                 ADULT SLP TREATMENT - 08/16/20 1203      General Information   Behavior/Cognition Alert;Cooperative;Pleasant mood      Treatment Provided   Treatment provided Cognitive-Linquistic      Dysphagia Treatment   Other treatment/comments Pt was independent with his swallow precautions with a cereal bar and water. No HEP at this time due to deficits due to surgical changes and muscle fibrosis.      Cognitive-Linquistic Treatment   Treatment focused on Voice    Skilled Treatment Lee Barnes's voice produced today with minimal strained/strangled quality. He completed his HEP for voice with cues to reduce rate, focus on ensuring breath and voicing on each production, and have good breath prior to each production. SLP talked with pt about his plan of care - pt and SLP agreed that if pt cont to look this good or better next session that it will be d/c day on 08-30-20.       Assessment / Recommendations /  MBSS to referring MD.    Speech Therapy Frequency 1x /week    Duration --   8 weeks or 17 total sessions   Treatment/Interventions Aspiration precaution training;Pharyngeal strengthening exercises;Diet toleration management by SLP;Trials of upgraded texture/liquids;Internal/external aids;Compensatory strategies;Patient/family education;SLP instruction and feedback;Environmental controls    Potential to Achieve Goals Fair    Potential Considerations Severity of impairments;Previous level of function    Consulted and Agree with Plan of Care Patient           Patient will benefit from skilled therapeutic intervention in order to improve the following deficits and impairments:   Voice hoarseness  Dysphagia, unspecified type  Dysarthria and anarthria    Problem List Patient Active Problem List   Diagnosis Date Noted  . 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 ACE-inhibitor cough 05/25/2015  . Essential hypertension 03/21/2015  . Allergic rhinitis 12/14/2013  . ED (erectile dysfunction) 12/14/2013  . Diverticulosis   . History of oropharyngeal cancer 09/18/2010    [facility] ,MS, [place]  08/16/2020, 1:14 PM  [facility] [address] [place], [place], [address] Phone: [phone]   Fax:  [phone]   Name: IMIR BRUMBACH MRN: [id number] Date of Birth: [birth year]

## 2020-08-30 ENCOUNTER — Other Ambulatory Visit: Payer: Self-pay

## 2020-08-30 ENCOUNTER — Ambulatory Visit: Payer: Medicare Other | Attending: Otolaryngology

## 2020-08-30 DIAGNOSIS — R471 Dysarthria and anarthria: Secondary | ICD-10-CM | POA: Insufficient documentation

## 2020-08-30 DIAGNOSIS — R1310 Dysphagia, unspecified: Secondary | ICD-10-CM | POA: Insufficient documentation

## 2020-08-30 DIAGNOSIS — R490 Dysphonia: Secondary | ICD-10-CM | POA: Insufficient documentation

## 2020-08-30 NOTE — Therapy (Signed)
[facility] [address] [place], [place], [address] Phone: [phone]   Fax:  [phone]  Speech Language Pathology Treatment/ Discharge Summary  Patient Details  Name: Lee Barnes MRN: [id number] Date of Birth: [birth year] Referring Provider (SLP): Melony Overly, MD   Encounter Date: 08/30/2020   End of Session - 08/30/20 1254    Visit Number 9    Number of Visits 17    Date for SLP Re-Evaluation 10/05/20    SLP Start Time 1150    SLP Stop Time  1230    SLP Time Calculation (min) 40 min    Activity Tolerance Patient tolerated treatment well           Past Medical History:  Diagnosis Date  . Allergy to environmental factors   . Complication of anesthesia    "vageled" after surgery -overnite stay  . Diverticulosis 2008  . Heart murmur    hx of in childhood   . History of chemotherapy   . History of radiation therapy 11/05/10-12/26/10   r base tongue, 7000 cGy 35 sessions  . Hypertension    Medication started in May 2016.  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 Pneumonia    hx of 2012  . 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 Tinnitus     Past Surgical History:  Procedure Laterality Date  . APPENDECTOMY    . COLONOSCOPY    . INGUINAL HERNIA REPAIR Bilateral 03/30/2015   Procedure: LAPAROSCOPIC BILATERAL INGUINAL HERNIA REPAIR WITH MESH;  Surgeon: Coralie Keens, MD;  Location: [facility];  Service: General;  Laterality: Bilateral;  . INGUINAL HERNIA REPAIR Right 1960  . INGUINAL HERNIA REPAIR Bilateral 03/30/2015  . INGUINAL HERNIA REPAIR Left 07/11/2015  . INGUINAL HERNIA REPAIR N/A 07/11/2015   Procedure: REPAIR OF LEFT INGUINAL HERNIA WITH MESH;  Surgeon: Coralie Keens, MD;  Location: [facility];  Service: General;  Laterality: N/A;  . INSERTION OF MESH N/A 07/11/2015   Procedure: INSERTION OF MESH;  Surgeon: Coralie Keens, MD;  Location: [facility];  Service: General;  Laterality:  N/A;  . LUMBAR [place]    . TONSILLECTOMY      There were no vitals filed for this visit.   Subjective Assessment - 08/30/20 1157    Subjective "We had a lot of company staying at our house. I talked."    Currently in Pain? No/denies                 ADULT SLP TREATMENT - 08/30/20 1158      General Information   Behavior/Cognition Alert;Cooperative;Pleasant mood      Treatment Provided   Treatment provided Cognitive-Linquistic      Cognitive-Linquistic Treatment   Treatment focused on Voice    Skilled Treatment Pt denied questions, comments or concerns when asked directly from SLP. He had not had any issues with swallowing since MBSS. Pt produced voice HEP (flow phonation words and phrases) with independence. SLP stressed to Lee Barnes to repeat word or phrase 3 times to establish/maintain muscle memory for easy, gentle voicing (voicing without strangled/tight voice). Pt assured SLP he was doing this. "Hard to establish a pattern" with voice HEP. Pt is doing HEP QD regularly, but not at a specific time each day. SLP encouraged pt to attempt to fit in once/day in addition to make it BID until mid-November, then QD x2/week after that. PT filled out VR_QOL and score was 80, or "Good" QOL, improved  unspecified type   SPEECH THERAPY DISCHARGE SUMMARY  Visits from Start of Care: 9  Current functional level related to goals / functional outcomes: Pt quality of life related to his voice quality has improved since evaluation. He reports he is happier with his swallowing and his voice now that he has education on how to improve voice function and what exactly was happening with his voice and his swallowing since his radiation therapy had ended.    Remaining deficits: Dysarthria, dysphagia, voice hoarseness   Education / Equipment: Reducing/eliminating strained vocal quality, abdominal breathing, swallow precautions  Plan: Patient agrees to discharge.  Patient goals were partially met. Patient is being discharged due to being pleased with the current functional level.  ?????       Problem List Patient Active Problem List   Diagnosis Date Noted  . 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 ACE-inhibitor cough 05/25/2015  . Essential hypertension 03/21/2015  . Allergic rhinitis 12/14/2013  . ED (erectile dysfunction) 12/14/2013  . Diverticulosis   . History of oropharyngeal cancer 09/18/2010    [facility] ,[place], [place]  08/30/2020, 12:57 PM  [facility] [address] [place], [place], [address] Phone: [phone]   Fax:  [phone]   Name: Lee Barnes MRN: [id number] Date of Birth: Nov 22, 1950  unspecified type   SPEECH THERAPY DISCHARGE SUMMARY  Visits from Start of Care: 9  Current functional level related to goals / functional outcomes: Pt quality of life related to his voice quality has improved since evaluation. He reports he is happier with his swallowing and his voice now that he has education on how to improve voice function and what exactly was happening with his voice and his swallowing since his radiation therapy had ended.    Remaining deficits: Dysarthria, dysphagia, voice hoarseness   Education / Equipment: Reducing/eliminating strained vocal quality, abdominal breathing, swallow precautions  Plan: Patient agrees to discharge.  Patient goals were partially met. Patient is being discharged due to being pleased with the current functional level.  ?????       Problem List Patient Active Problem List   Diagnosis Date Noted  . 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 ACE-inhibitor cough 05/25/2015  . Essential hypertension 03/21/2015  . Allergic rhinitis 12/14/2013  . ED (erectile dysfunction) 12/14/2013  . Diverticulosis   . History of oropharyngeal cancer 09/18/2010    [facility] ,[place], [place]  08/30/2020, 12:57 PM  [facility] [address] [place], [place], [address] Phone: [phone]   Fax:  [phone]   Name: Lee Barnes MRN: [id number] Date of Birth: Nov 22, 1950

## 2020-09-01 ENCOUNTER — Other Ambulatory Visit: Payer: Self-pay | Admitting: Family Medicine

## 2020-09-01 DIAGNOSIS — I10 Essential (primary) hypertension: Secondary | ICD-10-CM

## 2020-09-05 ENCOUNTER — Other Ambulatory Visit: Payer: Self-pay | Admitting: Family Medicine

## 2020-09-05 DIAGNOSIS — J3089 Other allergic rhinitis: Secondary | ICD-10-CM

## 2020-09-06 ENCOUNTER — Ambulatory Visit: Payer: Medicare Other

## 2020-09-25 DIAGNOSIS — Z961 Presence of intraocular lens: Secondary | ICD-10-CM | POA: Diagnosis not present

## 2020-10-10 DIAGNOSIS — M25511 Pain in right shoulder: Secondary | ICD-10-CM | POA: Diagnosis not present

## 2020-10-10 DIAGNOSIS — M542 Cervicalgia: Secondary | ICD-10-CM | POA: Diagnosis not present

## 2020-10-19 DIAGNOSIS — M272 Inflammatory conditions of jaws: Secondary | ICD-10-CM | POA: Diagnosis not present

## 2020-10-19 DIAGNOSIS — M542 Cervicalgia: Secondary | ICD-10-CM | POA: Diagnosis not present

## 2020-10-19 DIAGNOSIS — Y842 Radiological procedure and radiotherapy as the cause of abnormal reaction of the patient, or of later complication, without mention of misadventure at the time of the procedure: Secondary | ICD-10-CM | POA: Diagnosis not present

## 2020-10-23 DIAGNOSIS — L821 Other seborrheic keratosis: Secondary | ICD-10-CM | POA: Diagnosis not present

## 2020-10-23 DIAGNOSIS — Z85828 Personal history of other malignant neoplasm of skin: Secondary | ICD-10-CM | POA: Diagnosis not present

## 2020-10-23 DIAGNOSIS — L111 Transient acantholytic dermatosis [Grover]: Secondary | ICD-10-CM | POA: Diagnosis not present

## 2020-10-23 DIAGNOSIS — D225 Melanocytic nevi of trunk: Secondary | ICD-10-CM | POA: Diagnosis not present

## 2020-10-23 DIAGNOSIS — L570 Actinic keratosis: Secondary | ICD-10-CM | POA: Diagnosis not present

## 2020-10-26 DIAGNOSIS — Z23 Encounter for immunization: Secondary | ICD-10-CM | POA: Diagnosis not present

## 2020-11-06 DIAGNOSIS — M542 Cervicalgia: Secondary | ICD-10-CM | POA: Diagnosis not present

## 2020-11-06 DIAGNOSIS — M5412 Radiculopathy, cervical region: Secondary | ICD-10-CM | POA: Diagnosis not present

## 2020-11-06 DIAGNOSIS — M5022 Other cervical disc displacement, mid-cervical region, unspecified level: Secondary | ICD-10-CM | POA: Diagnosis not present

## 2020-11-06 DIAGNOSIS — M25511 Pain in right shoulder: Secondary | ICD-10-CM | POA: Diagnosis not present

## 2020-11-20 DIAGNOSIS — I951 Orthostatic hypotension: Secondary | ICD-10-CM | POA: Diagnosis not present

## 2020-11-20 DIAGNOSIS — I10 Essential (primary) hypertension: Secondary | ICD-10-CM | POA: Diagnosis not present

## 2020-11-21 DIAGNOSIS — M4712 Other spondylosis with myelopathy, cervical region: Secondary | ICD-10-CM | POA: Diagnosis not present

## 2020-11-24 DIAGNOSIS — M4712 Other spondylosis with myelopathy, cervical region: Secondary | ICD-10-CM | POA: Diagnosis not present

## 2020-11-27 DIAGNOSIS — M4712 Other spondylosis with myelopathy, cervical region: Secondary | ICD-10-CM | POA: Diagnosis not present

## 2020-11-28 ENCOUNTER — Other Ambulatory Visit: Payer: Self-pay | Admitting: Family Medicine

## 2020-11-28 DIAGNOSIS — I10 Essential (primary) hypertension: Secondary | ICD-10-CM

## 2020-11-29 DIAGNOSIS — M4712 Other spondylosis with myelopathy, cervical region: Secondary | ICD-10-CM | POA: Diagnosis not present

## 2020-12-06 DIAGNOSIS — J181 Lobar pneumonia, unspecified organism: Secondary | ICD-10-CM | POA: Diagnosis not present

## 2020-12-06 DIAGNOSIS — J189 Pneumonia, unspecified organism: Secondary | ICD-10-CM | POA: Diagnosis not present

## 2020-12-06 DIAGNOSIS — R059 Cough, unspecified: Secondary | ICD-10-CM | POA: Diagnosis not present

## 2020-12-06 DIAGNOSIS — R55 Syncope and collapse: Secondary | ICD-10-CM | POA: Diagnosis not present

## 2020-12-06 DIAGNOSIS — Z79899 Other long term (current) drug therapy: Secondary | ICD-10-CM | POA: Diagnosis not present

## 2020-12-06 DIAGNOSIS — R42 Dizziness and giddiness: Secondary | ICD-10-CM | POA: Diagnosis not present

## 2020-12-06 DIAGNOSIS — R6883 Chills (without fever): Secondary | ICD-10-CM | POA: Diagnosis not present

## 2020-12-06 DIAGNOSIS — Z85818 Personal history of malignant neoplasm of other sites of lip, oral cavity, and pharynx: Secondary | ICD-10-CM | POA: Diagnosis not present

## 2020-12-06 DIAGNOSIS — Z743 Need for continuous supervision: Secondary | ICD-10-CM | POA: Diagnosis not present

## 2020-12-06 DIAGNOSIS — I10 Essential (primary) hypertension: Secondary | ICD-10-CM | POA: Diagnosis not present

## 2020-12-06 DIAGNOSIS — Z20822 Contact with and (suspected) exposure to covid-19: Secondary | ICD-10-CM | POA: Diagnosis not present

## 2020-12-06 DIAGNOSIS — E871 Hypo-osmolality and hyponatremia: Secondary | ICD-10-CM | POA: Diagnosis not present

## 2020-12-06 NOTE — ED Provider Notes (Signed)
[facility]        Patient:   Russell Phelps, Russell Phelps            MRN: [id number]            FIN: [id number]               Age:   70 years     Sex:  Male     DOB:  [birth year]   Associated Diagnoses:   Pneumonia; Hyponatremia; Near syncope   Author:   Percell Miller      Basic Information   Time seen: Provider Seen (ST)   ED Provider/Time:    Marnee Guarneri GAILLARD-MD / 12/06/2020 19:45  .   Additional information: Chief Complaint from Nursing Triage Note   Chief Complaint  Chief Complaint: pr reports feeling lightheaded this evening and his family assisted him to the ground. reports that sometimes his BP will drop and he has to sit for a few moments. EMS gave 450m LR (12/06/20 19:45:00).      History of Present Illness   This is a 70year old male with history of hypertension as well as oropharyngeal squamous cell carcinoma who presents for evaluation of chills.  Patient reports that he was out fishing all day and felt cold this evening.  He said it felt like his blood pressure was dropping and he got lightheaded.  He did not pass out.  Denies any associated headache, chest pain, dyspnea or diaphoresis.  He was transported here for evaluation by EMS.  Noted to have low-grade fever and tachycardia here.  He has had a dry cough for unclear period of time.  He has received his 2 COVID shots but no booster at this point.  Reports his blood pressure generally runs at 185 baseline.  He is only on Norvasc.  [place] from [place].  Denies any medical issues outside of hypertension or the squamous cell carcinoma of his mouth.  He says that he was distantly treated with radiation and has issues with vasovagal syncope secondary to this.  He says he feels like he needs some fluids right now.      Review of Systems   Constitutional symptoms:  Chills.   Respiratory symptoms:  Cough, No shortness of breath,    Cardiovascular symptoms:  No chest pain,    Gastrointestinal symptoms:  No abdominal pain,     Neurologic symptoms:  No headache,              Additional review of systems information: All other systems reviewed and otherwise negative.      Health Status   Allergies:    Allergic Reactions (Selected)  Moderate  Codeine- Stomach upset..   Medications:  (Selected)   Documented Medications  Documented  Flonase 50 mcg/inh nasal spray: 1 sprays, Nasal, BID, 16 g, 0 Refill(s)  ZyrTEC: 5 mg, Oral, Daily, PRN: allergy symptoms, 0 Refill(s)  amLODIPine 5 mg oral tablet: 5 mg, 1 tabs, Oral, Daily, 0 Refill(s).      Past Medical/ Family/ Social History   Medical history: Reviewed as documented in chart.   Surgical history: Reviewed as documented in chart.   Family history: Not significant.   Social history: Reviewed as documented in chart.   Problem list:    Active Problems (2)  HTN (hypertension)   Squamous cell carcinoma of tongue   , per nurse's notes.      Physical Examination  Vital Signs   Vital Signs   12/06/2020 19:55 EST Peripheral Pulse Rate 128 bpm  HI    Heart Rate Monitored 125 bpm  HI    Respiratory Rate 22 br/min  HI    Mean Arterial Pressure, Cuff 140 mmHg  HI    SpO2 97 %   2/70/6237 62:83 EST Systolic Blood Pressure 151 mmHg  >HHI    Diastolic Blood Pressure 761 mmHg  >HHI    Temperature Oral 37.8 degC  HI    Heart Rate Monitored 125 bpm  HI    Respiratory Rate 18 br/min    SpO2 98 %   .   Measurements   12/06/2020 19:49 EST Body Mass Index est meas 23.70 kg/m2    Body Mass Index Measured 23.70 kg/m2   12/06/2020 19:45 EST Height/Length Measured 182 cm    Weight Dosing 78.5 kg   .   Basic Oxygen Information   12/06/2020 19:55 EST Oxygen Therapy Room air    SpO2 97 %   12/06/2020 19:45 EST Oxygen Therapy Room air    SpO2 98 %   .   General:  Alert, no acute distress.    Skin:  Warm, dry, pink, intact.    Head:  Normocephalic, atraumatic.    Neck:  Supple, trachea midline, no tenderness.    Eye:  Pupils are equal, round and reactive to light, extraocular movements are intact, normal conjunctiva.     Ears, nose, mouth and throat:  Oral mucosa moist.   Cardiovascular:  No murmur, Normal peripheral perfusion, No edema, Tachycardia.    Respiratory:  Respirations are non-labored, Symmetrical chest wall expansion, Breath sounds are diminished at the right lung base, with lungs otherwise clear..    Chest wall:  No tenderness.   Back:  Nontender, Normal range of motion.    Musculoskeletal:  Normal ROM, normal strength, no swelling.    Gastrointestinal:  Soft, Nontender, Non distended.    Neurological:  Alert and oriented to person, place, time, and situation, No focal neurological deficit observed.    Lymphatics:  No lymphadenopathy.      Medical Decision Making   Differential Diagnosis:  Viral syndrome, pneumonia, bronchitis, urinary tract infection, sepsis, influenza.    Documents reviewed:  Emergency department nurses' notes, flowsheet, emergency medical system run report, emergency department records, prior records, vital signs, EKG reviewed by myself.  Please see external review..    Results review:  Lab results : Lab View   12/06/2020 20:53 EST Estimated Creatinine Clearance 75.74 mL/min   12/06/2020 20:34 EST Sodium Lvl 128 mmol/L  LOW    Potassium Lvl 4.1 mmol/L    Chloride 90 mmol/L  LOW    CO2 25 mmol/L    Glucose Random 123 mg/dL  HI    BUN 17 mg/dL    Creatinine Lvl 1.0 mg/dL    AGAP 13 mmol/L    Osmolality Calc 260 mOsm/kg  LOW    Calcium Lvl 9.2 mg/dL    Protein Total 7.5 g/dL    Albumin Lvl 4.1 g/dL    Globulin Calc 3.4 g/dL    AG Ratio Calc 1.20 mmol/L    Alk Phos 141 unit/L  HI    AST 17 unit/L    ALT 12 unit/L    eGFR AA 89 mL/min/1.37m???  LOW    eGFR Non-AA 76 mL/min/1.759m??  LOW    Bili Total 0.39 mg/dL    Lactic Acid Lvl 0.8 mmol/L    Procalcitonin 0.09 ng/mL  12/06/2020 20:18 EST SARS-CoV-2 ([facility]AT) Not Detected    Influenza A ([facility]AT) Not Detected    Influenza B (LIAT) Not Detected   12/06/2020 20:15 EST UA Color Light Yellow    UA Appear Clear    UA Glucose Negative    UA Bili Negative    UA Ketones  Negative    UA Spec Grav 1.020    UA Blood Trace    UA pH 7.0    Protein U Negative    UA Urobilinogen 0.2 EU/dL    UA Nitrite Negative    UA Leuk Est Negative   12/06/2020 20:14 EST WBC 11.8 x10e3/mcL  HI    RBC 4.59 x10e6/mcL    Hgb 12.9 g/dL  LOW    HCT 38.4 %    MCV 83.7 fL  LOW    MCH 28.1 pg    MCHC 33.6 g/dL    RDW 14.3 %    Platelet 225 x10e3/mcL    MPV 9.4 fL    Neutro Auto 87.5 %  HI    Neutro Absolute 10.3 x10e3/mcL  HI    Immature Grans Percent 0.3 %    Immature Grans Absolute 0.04 x10e3/mcL    Lymph Auto 4.9 %  LOW    Lymph Absolute 0.6 x10e3/mcL  LOW    Mono Auto 6.5 %    Mono Absolute 0.8 x10e3/mcL    Eosinophil Percent 0.6 %    Eos Absolute 0.1 x10e3/mcL    Basophil Auto 0.2 %    Baso Absolute 0.0 x10e3/mcL    NRBC Absolute Auto 0.000 x10e3/mcL    NRBC Percent Auto 0.0 %   .   Radiology results:  Rad Results (ST)   CT Thorax w/ Contrast  ?  12/06/20 21:33:18  CT chest with contrast: 12/06/20    INDICATION:Pneumonia, effusion or abscess suspected, xray done;[facility].    COMPARISON: Radiograph 12/06/2020    TECHNIQUE: Routine postcontrast protocol (Venous phase helical acquisition from  the thoracic inlet through the hemidiaphragms with axial 5 x 5 mm soft tissue  and 2 x 2 mm lung algorithms. Lung algoritm coronal 3 x 3 mm reconstruction.) CT  scanning was performed using radiation dose reduction techniques when  appropriate, per system protocols.    Lungs/Airways: Multifocal consolidation throughout the mid and basal right lower  lobe. Multifocal patchy opacities to a lesser extent within the right middle  lobe. No focal airspace disease in the left lung.    Pleura: No pleural effusion or pneumothorax.    Lymph Nodes: No mediastinal, hilar, or axillary adenopathy.    Cardiovascular/Mediastinum: Normal heart size. No pericardial effusion.  Elevation of the right hemidiaphragm.    Osseous structures/Soft Tissues: No suspicious lytic or blastic osseous lesions.    Upper Abdomen: Duodenal diverticulum.  Subcentimeter left renal hypodensity,  likely a cyst. Cortical scarring in the lower pole the right kidney.    IMPRESSION:  Right basilar pneumonia involving the right lower and middle lobes.  ?  Signed By: Earma Reading  ?  **************************************************  XR Chest 1 View Portable  ?  12/06/20 20:41:17  Chest AP: 12/06/20    INDICATION:Fever.    COMPARISON: None    FINDINGS: The cardiomediastinal silhouette is normal in size and contour. There  is elevation of the right hemidiaphragm. Airspace opacities in the right lung  base. No pneumothorax or pleural effusion. There are no acute osseous  abnormalities.    IMPRESSION:  Right basilar pneumonia.    Elevation of the right  hemidiaphragm.  ?  Signed By: Earma Reading  .   This patient has some chills and felt lightheaded prior to arrival.  Generally well-appearing and neurologically intact but has low-grade fever, tachycardia and diminished breath sounds in the right lung base.  He has had a cough.  Worrisome for pneumonia.  Will obtain chest x-ray and septic work-up.  He is notably hypertensive here, but says his systolic blood pressure generally runs around 185.  I will not immediately treat this, but we will continue to observe.      Reexamination/ Reevaluation   Patient appears to have right lower lobe pneumonia.  Chest x-ray appeared to have some elevation of the right hemidiaphragm with significant infiltrate, so given his history of cancer I have elected to obtain a CT of his thorax to further investigate.  This confirms pneumonia.  This would fit his clinical picture.  He is feeling much better.  Sats are in the mid 90s.  He is not feeling lightheaded when he stands.  Pressure has improved.  I have offered admission to the patient, but he has declined and would prefer to follow-up with his PCP when he returns to [place] this weekend.  I think this is reasonable.  I will double cover with Augmentin and Zithromax.  While he is  in town through Sunday I have advised he return to the ER with any worsening symptoms. Of note, his I did update his wife Joseph Art.  She reports that his sodium level runs at baseline around 129, so this is not an acute issue.  I have counseled her regarding his care here today and she is aware to bring him back with any concerning worsening symptoms. Will give imaging to share with his PCP as well.      Impression and Plan   Diagnosis   Pneumonia (ICD10-CM J18.9, Discharge, Medical)   Hyponatremia (ICD10-CM E87.1, Discharge, Medical)   Near syncope (ICD10-CM R55, Discharge, Medical)   Plan   Condition: Improved.    Disposition: Medically cleared, Discharged: Time  12/06/2020 22:10:00, to home.    Prescriptions: [facility] prescriptions   Pharmacy:  Mucinex DM Maximum Strength 60 mg-1200 mg oral tablet, extended release (Prescribe): 1 tabs, Oral, BID, for 7 days, 14 tabs, 0 Refill(s)  Zithromax 250 mg oral tablet (Prescribe): 250 mg, 1 tabs, Oral, Daily, for 4 days, 4 tabs, 0 Refill(s)  Augmentin 875 mg-125 mg oral tablet (Prescribe): 1 tabs, Oral, BID, for 10 days, 20 tabs, 0 Refill(s).    Patient was given the following educational materials: Pneumonia (Adult).    Follow up with: Follow up with primary care provider Within 1 week Please return to the Emergency Department with any worsening or persistent symptoms. Please follow-up with recommended physician in the time frame listed. You should have a repeat chest x-ray in 1-2 weeks. Please call with any problems or concerns. If you have no primary care doctor, please call [phone] to establish care with one. Thank you for choosing [facility]!  Marland Kitchen    Counseled: I had a detailed discussion with the patient and/or guardian regarding the historical points/exam findings supporting the discharge diagnosis and need for outpatient followup. Discussed the need to return to the ER if symptoms persist/worsen, or for any questions/concerns that arise at home.    Signature Line      Electronically Signed on 12/06/2020 10:12 PM EST   ________________________________________________   Marnee Guarneri GAILLARD-MD  Modified by: Marnee Guarneri GAILLARD-MD on 12/06/2020 08:41 PM EST      Modified by: Marnee Guarneri GAILLARD-MD on 12/06/2020 09:00 PM EST      Modified by: Marnee Guarneri GAILLARD-MD on 12/06/2020 10:12 PM EST

## 2020-12-06 NOTE — ED Notes (Signed)
ED Patient Education Note     Patient Education Materials Follows:  Pulmonary            Pneumonia (Adult)   Pneumonia is an infection deep in the lungs. It is in the small air sacs (alveoli). It may be caused by a virus, fungus, or bacteria. Pneumonia caused by bacteria??is often treated with an antibiotic. Severe cases may need to be treated in the hospital. Milder cases can be treated at home. Pneumonia symptoms are a lot like flu symptoms. They include fever, cough (dry or with phlegm), headache, muscle weakness, and pain. These symptoms often get worse in the first 2 days. But they often start to get better in the first week of treatment.           Home care   Follow these guidelines when caring for yourself at home:     ??Get plenty of rest. Don?t let yourself get overly tired when you go back to your activities. Participate in activities as directed by your healthcare provider.     ??Stop smoking. This is the most important step you can take to help treat pneumonia. If you need help stopping smoking, talk with your healthcare provider.     ??Stay away from smoke and other irritants. Stay away from secondhand smoke. Don?t let anyone smoke in your home.     ??Prevent lung infections. Ask your healthcare provider about the flu and pneumonia vaccines. Take steps to prevent colds and other lung infections.     ??Practice correct handwashing. Wash your hands often with soap and water. Use hand sanitizer when you can?t wash your hands. Stay away from crowds during cold and flu season.     ??Use pain medicine as directed. You may use acetaminophen or ibuprofen to control fever or pain, unless another medicine was prescribed. If you have chronic liver or kidney disease, talk with your healthcare provider before using these medicines. Also talk with your provider if you?ve had a stomach ulcer or GI (gastrointestinal) bleeding. Don?t give aspirin to a child younger than age 19 unless directed by the provider. Taking aspirin  can put a child at risk for Reye syndrome. This is a rare but very serious disorder. It most often affects the brain and the liver.     ??Eat a light diet as needed. You may not feel like eating, so a light diet is fine. Follow the treatment plan as advised by your healthcare provider.     ??Drink plenty of water and fluids. This can make mucus thinner and easier to cough up. Ask your healthcare provider how much water you should drink. For many people, 6 to 8 glasses (8 ounces each) a day is a good goal. Other fluids include sport drinks, sodas without caffeine, juices, tea, or soup. If you also have heart or kidney disease, check with your provider before you drink extra fluids.     ??Finish all prescription medicine. Take antibiotic or antiviral medicine as prescribed by your healthcare provider, even if you are feeling better after a few days. Take the medicine until it is all gone.     ??Try to stay away from air pollution. If you live in an area with air pollution, track the Air Quality Index (AQI) reports and plan your outdoor activities with the AQI recommendations in mind    Follow-up care   Follow up with your healthcare provider in the next 2 to 3 days, or as advised. This is to be   sure the medicine is helping you get better.   If you are 65 or older, you should get a pneumococcal vaccine and a yearly??flu (influenza)??shot. You should also get these vaccines if you have chronic lung disease such as asthma, emphysema, or COPD. A second type of pneumonia vaccine is also available for people over age 65 and those younger than 65 with certain health conditions. Talk with your healthcare provider about which pneumococcal vaccine is best for you.   Call 911   Call 911 if any of these occur:     ??Unable to speak or swallow     ??Lips or skin looks blue, purple, or gray     ??Feeling dizzy or faint     ??Unable to wake up or loss of consciousness     ??Feeling of doom     ??Trouble breathing or wheezing     ??Shortness of  breath gets worse or doesn't get better with treatment     ??Rapid breathing (more than 25 breaths per minute)     ??Coughing up blood     ??Chest pain gets worse with breathing or doesn't get better with treatment    When to get medical advice   Call your healthcare provider right away if any of these occur:     ??You don?t get better in the first 2 days of treatment     ??Fever of??100.4??F (38??C) or higher, or as directed by your healthcare provider     ??Shaking chills     ??Cough with phlegm that doesn't get better, or get worse     ??Shortness of breath with activities     ??Weakness, dizziness, or fainting that gets worse     ??Thirst or dry mouth that gets worse     ??Sinus pain, headache, or a stiff neck     ??Chest pain with breathing or coughing     ??Symptoms that get worse or not improving       ?? 2000-2021 [employer]. All rights reserved. This information is not intended as a substitute for professional medical care. Always follow your healthcare professional's instructions.

## 2020-12-06 NOTE — ED Notes (Signed)
ED Note-Nursing       ED RN Reassessment Entered On:  12/06/2020 21:19 EST    Performed On:  12/06/2020 21:17 EST by Jackelyn Hoehn               ED RN Reassessment   ED Patient condition :   Alert, Feeling slightly better   ED Behavioral Activity Rating Scale :   4 - Quiet and awake (normal level of activity)   ED RN Progress Note :   pt responding well to fluids, HR decreasing to low 100's.    ED RN Misc Notes :   called pt's wife, Luster Landsberg, to update on pt's status and current results. all questions/concerns addressed. will call wife back after CT results.   Jackelyn Hoehn - 12/06/2020 21:17 EST

## 2020-12-06 NOTE — ED Notes (Signed)
ED Note-Nursing       ED RN Reassessment Entered On:  12/06/2020 22:08 EST    Performed On:  12/06/2020 22:07 EST by Jackelyn Hoehn               ED RN Reassessment   ED Patient condition :   Ready to go home, Alert   ED RN Misc Notes :   Dr Katrinka Blazing spoke with pt and pt's wife and discussed plan to discharge pt home with abx. all questions answered. ride on the way.   Jackelyn Hoehn - 12/06/2020 22:07 EST

## 2020-12-06 NOTE — ED Notes (Signed)
ED Patient Education Note     Patient Education Materials Follows:  Pulmonary            Pneumonia (Adult)   Pneumonia is an infection deep in the lungs. It is in the small air sacs (alveoli). It may be caused by a virus, fungus, or bacteria. Pneumonia caused by bacteriais often treated with an antibiotic. Severe cases may need to be treated in the hospital. Milder cases can be treated at home. Pneumonia symptoms are a lot like flu symptoms. They include fever, cough (dry or with phlegm), headache, muscle weakness, and pain. These symptoms often get worse in the first 2 days. But they often start to get better in the first week of treatment.           Home care   Follow these guidelines when caring for yourself at home:     Get plenty of rest. Don?t let yourself get overly tired when you go back to your activities. Participate in activities as directed by your healthcare provider.     Stop smoking. This is the most important step you can take to help treat pneumonia. If you need help stopping smoking, talk with your healthcare provider.     Stay away from smoke and other irritants. Stay away from secondhand smoke. Don?t let anyone smoke in your home.     Prevent lung infections. Ask your healthcare provider about the flu and pneumonia vaccines. Take steps to prevent colds and other lung infections.     Practice correct handwashing. Wash your hands often with soap and water. Use hand sanitizer when you can?t wash your hands. Stay away from crowds during cold and flu season.     Use pain medicine as directed. You may use acetaminophen or ibuprofen to control fever or pain, unless another medicine was prescribed. If you have chronic liver or kidney disease, talk with your healthcare provider before using these medicines. Also talk with your provider if you?ve had a stomach ulcer or GI (gastrointestinal) bleeding. Don?t give aspirin to a child younger than age 71 unless directed by the provider. Taking aspirin  can put a child at risk for Reye syndrome. This is a rare but very serious disorder. It most often affects the brain and the liver.     Eat a light diet as needed. You may not feel like eating, so a light diet is fine. Follow the treatment plan as advised by your healthcare provider.     Drink plenty of water and fluids. This can make mucus thinner and easier to cough up. Ask your healthcare provider how much water you should drink. For many people, 6 to 8 glasses (8 ounces each) a day is a good goal. Other fluids include sport drinks, sodas without caffeine, juices, tea, or soup. If you also have heart or kidney disease, check with your provider before you drink extra fluids.     Finish all prescription medicine. Take antibiotic or antiviral medicine as prescribed by your healthcare provider, even if you are feeling better after a few days. Take the medicine until it is all gone.     Try to stay away from air pollution. If you live in an area with air pollution, track the Air Quality Index (AQI) reports and plan your outdoor activities with the AQI recommendations in mind    Follow-up care   Follow up with your healthcare provider in the next 2 to 3 days, or as advised. This is to be  sure the medicine is helping you get better.   If you are 23 or older, you should get a pneumococcal vaccine and a yearlyflu (influenza)shot. You should also get these vaccines if you have chronic lung disease such as asthma, emphysema, or COPD. A second type of pneumonia vaccine is also available for people over age 20 and those younger than 1 with certain health conditions. Talk with your healthcare provider about which pneumococcal vaccine is best for you.   Call 911   Call 911 if any of these occur:     Unable to speak or swallow     Lips or skin looks blue, purple, or gray     Feeling dizzy or faint     Unable to wake up or loss of consciousness     Feeling of doom     Trouble breathing or wheezing     Shortness of  breath gets worse or doesn't get better with treatment     Rapid breathing (more than 25 breaths per minute)     Coughing up blood     Chest pain gets worse with breathing or doesn't get better with treatment    When to get medical advice   Call your healthcare provider right away if any of these occur:     You don?t get better in the first 2 days of treatment     Fever of100.81F (38C) or higher, or as directed by your healthcare provider     Shaking chills     Cough with phlegm that doesn't get better, or get worse     Shortness of breath with activities     Weakness, dizziness, or fainting that gets worse     Thirst or dry mouth that gets worse     Sinus pain, headache, or a stiff neck     Chest pain with breathing or coughing     Symptoms that get worse or not improving        2000-2021 [employer], [place]. All rights reserved. This information is not intended as a substitute for professional medical care. Always follow your healthcare professional's instructions.

## 2020-12-06 NOTE — ED Notes (Signed)
ED Pre-Arrival Note        Pre-Arrival Summary    Name:  Russell Phelps,    Current Date:  12/06/2020 19:44:19 EST  Gender:  Male  Date of Birth:    Age:  70  Pre-Arrival Type:  EMS  ETA:  12/06/2020 20:03:00 EST  Primary Care Physician:    Presenting Problem:  syncope  Pre-Arrival User:  Lajean Saver RN, Tad Moore J  Referring Source:    Location:  [facility]  BP:  118/85  HR:  120  Resp:  18  O2:  95  Blood Sugar:  121            PreArrival Communication Form  Emergency Department        Additional Patient Information:        Orders:  [    ] CBC                                            [     ] CT Head no contrast  [    ] BMP                                           [     ] CT Abdomen/Pelvis no contrast  [    ] PT/INR                                       [     ] CT Abdomen/Pelvis IV contrast, w/ oral contrast  [    ] Troponin                                   [     ] CT Abdomen/Pelvis IV contrast, no oral contrast  [    ] BNP                                            [     ] See ordersheet  [    ] CXR                                             [     ] Other:__________________________  [    ] EKG

## 2020-12-06 NOTE — Discharge Summary (Signed)
 ED Clinical Summary                        [facility]  [address]  [place], [place], [address]  [phone]           PERSON INFORMATION  Name: Russell Phelps, Russell Phelps Age:  70 Years DOB: [birth year]   Sex: Male Language: English PCP: PCP,  NONE   Marital Status: Married Phone: [phone] Med Service: MED-Medicine   MRN: [id number] Acct# [id number] Arrival: 12/06/2020 19:43:00   Visit Reason: Chills; Syncope/Near syncope; SYNCOPE Acuity: 3 LOS: 000 02:55   Address:    [address] [place] [address]   Diagnosis:    Hyponatremia; Near syncope; Pneumonia  Medications:          New Medications  Printed Prescriptions  amoxicillin-clavulanate (Augmentin 875 mg-125 mg oral tablet) 1 Tabs Oral (given by mouth) 2 times a day for 10 Days. Refills: 0.  Last Dose:____________________  azithromycin (Zithromax 250 mg oral tablet) 1 Tabs Oral (given by mouth) every day for 4 Days. Refills: 0.  Last Dose:____________________  dextromethorphan-guaiFENesin (Mucinex DM Maximum Strength 60 mg-1200 mg oral tablet, extended release) 1 Tabs Oral (given by mouth) 2 times a day for 7 Days. Refills: 0.  Last Dose:____________________  Medications that have not changed  Other Medications  amLODIPine (amLODIPine 5 mg oral tablet) 1 Tabs Oral (given by mouth) every day.  Last Dose:____________________  cetirizine (ZyrTEC) 5 Milligram Oral (given by mouth) every day as needed allergy symptoms.  Last Dose:____________________  fluticasone nasal (Flonase 50 mcg/inh nasal spray) 1 Sprays Nasal (into the nose) 2 times a day.  Last Dose:____________________      Medications Administered During Visit:                Medication Dose Route   acetaminophen 1000 mg Oral   Sodium Chloride 0.9% 1000 mL IV Piggyback   ceftriaxone 1 g IV Piggyback   azithromycin 500 mg Oral               Allergies      codeine (Stomach upset)      Major Tests and Procedures:  The following procedures and tests were performed during your ED visit.  COMMON  PROCEDURES%>  COMMON PROCEDURES COMMENTS%>                PROVIDER INFORMATION               Provider Role Assigned Sampson CLAUDENE COY [facility] ED Provider 12/06/2020 19:45:13    Anthonette Lewis ED Nurse 12/06/2020 19:52:14        Attending Physician:  CLAUDENE COY GAILLARD-MD      Admit Doc  CLAUDENE COY GAILLARD-MD     Consulting Doc       VITALS INFORMATION  Vital Sign Triage Latest   Temp Oral ORAL_1%> ORAL%>   Temp Temporal TEMPORAL_1%> TEMPORAL%>   Temp Intravascular INTRAVASCULAR_1%> INTRAVASCULAR%>   Temp Axillary AXILLARY_1%> AXILLARY%>   Temp Rectal RECTAL_1%> RECTAL%>   02 Sat 98 % 94 %   Respiratory Rate RATE_1%> RATE%>   Peripheral Pulse Rate PULSE RATE_1%>128 bpm PULSE RATE%>   Apical Heart Rate HEART RATE_1%> HEART RATE%>   Blood Pressure BLOOD PRESSURE_1%>/ BLOOD PRESSURE_1%>126 mmHg BLOOD PRESSURE%> / BLOOD PRESSURE%>92 mmHg                 Immunizations      No Immunizations Documented This Visit  DISCHARGE INFORMATION   Discharge Disposition: H Outpt-Sent Home   Discharge Location:  Home   Discharge Date and Time:  12/06/2020 22:38:00   ED Checkout Date and Time:  12/06/2020 22:38:00     DEPART REASON INCOMPLETE INFORMATION               Depart Action Incomplete Reason   Interactive View/I&O Recently assessed               Problems      No Problems Documented              Smoking Status      Never (less than 100 in lifetime)         PATIENT EDUCATION INFORMATION  Instructions:     Pneumonia (Adult)     Follow up:                   With: Address: When:   Follow up with primary care provider  Within 1 week   Comments:   Please return to the Emergency Department with any worsening or persistent symptoms. Please follow-up with recommended physician in the time frame listed. You should have a repeat chest x-ray in 1-2 weeks. Please call with any problems or concerns. If you have no primary care doctor, please call [phone] to establish care with one. Thank you for choosing [facility]!                  ED PROVIDER DOCUMENTATION     Patient:   Russell Phelps, Russell Phelps            MRN: [id number]            FIN: [id number]               Age:   70 years     Sex:  Male     DOB:  21-Jan-1951   Associated Diagnoses:   Pneumonia; Hyponatremia; Near syncope   Author:   CLAUDENE MAUDE CORNWALL      Basic Information   Time seen: Provider Seen (ST)   ED Provider/Time:    CLAUDENE MAUDE GAILLARD-MD / 12/06/2020 19:45  .   Additional information: Chief Complaint from Nursing Triage Note   Chief Complaint  Chief Complaint: pr reports feeling lightheaded this evening and his family assisted him to the ground. reports that sometimes his BP will drop and he has to sit for a few moments. EMS gave 400ml LR (12/06/20 19:45:00).      History of Present Illness   This is a 70 year old male with history of hypertension as well as oropharyngeal squamous cell carcinoma who presents for evaluation of chills.  Patient reports that he was out fishing all day and felt cold this evening.  He said it felt like his blood pressure was dropping and he got lightheaded.  He did not pass out.  Denies any associated headache, chest pain, dyspnea or diaphoresis.  He was transported here for evaluation by EMS.  Noted to have low-grade fever and tachycardia here.  He has had a dry cough for unclear period of time.  He has received his 2 COVID shots but no booster at this point.  Reports his blood pressure generally runs at 185 baseline.  He is only on Norvasc.  He is visiting from the [place] area.  Denies any medical issues outside of hypertension or the squamous cell carcinoma of his mouth.  He says that he was distantly treated  with radiation and has issues with vasovagal syncope secondary to this.  He says he feels like he needs some fluids right now.      Review of Systems   Constitutional symptoms:  Chills.   Respiratory symptoms:  Cough, No shortness of breath,    Cardiovascular symptoms:  No chest pain,    Gastrointestinal  symptoms:  No abdominal pain,    Neurologic symptoms:  No headache,              Additional review of systems information: All other systems reviewed and otherwise negative.      Health Status   Allergies:    Allergic Reactions (Selected)  Moderate  Codeine- Stomach upset..   Medications:  (Selected)   Documented Medications  Documented  Flonase 50 mcg/inh nasal spray: 1 sprays, Nasal, BID, 16 g, 0 Refill(s)  ZyrTEC: 5 mg, Oral, Daily, PRN: allergy symptoms, 0 Refill(s)  amLODIPine 5 mg oral tablet: 5 mg, 1 tabs, Oral, Daily, 0 Refill(s).      Past Medical/ Family/ Social History   Medical history: Reviewed as documented in chart.   Surgical history: Reviewed as documented in chart.   Family history: Not significant.   Social history: Reviewed as documented in chart.   Problem list:    Active Problems (2)  HTN (hypertension)   Squamous cell carcinoma of tongue   , per nurse's notes.      Physical Examination               Vital Signs   Vital Signs   12/06/2020 19:55 EST Peripheral Pulse Rate 128 bpm  HI    Heart Rate Monitored 125 bpm  HI    Respiratory Rate 22 br/min  HI    Mean Arterial Pressure, Cuff 140 mmHg  HI    SpO2 97 %   12/06/2020 19:45 EST Systolic Blood Pressure 210 mmHg  >HHI    Diastolic Blood Pressure 126 mmHg  >HHI    Temperature Oral 37.8 degC  HI    Heart Rate Monitored 125 bpm  HI    Respiratory Rate 18 br/min    SpO2 98 %   .   Measurements   12/06/2020 19:49 EST Body Mass Index est meas 23.70 kg/m2    Body Mass Index Measured 23.70 kg/m2   12/06/2020 19:45 EST Height/Length Measured 182 cm    Weight Dosing 78.5 kg   .   Basic Oxygen Information   12/06/2020 19:55 EST Oxygen Therapy Room air    SpO2 97 %   12/06/2020 19:45 EST Oxygen Therapy Room air    SpO2 98 %   .   General:  Alert, no acute distress.    Skin:  Warm, dry, pink, intact.    Head:  Normocephalic, atraumatic.    Neck:  Supple, trachea midline, no tenderness.    Eye:  Pupils are equal, round and reactive to light, extraocular movements  are intact, normal conjunctiva.    Ears, nose, mouth and throat:  Oral mucosa moist.   Cardiovascular:  No murmur, Normal peripheral perfusion, No edema, Tachycardia.    Respiratory:  Respirations are non-labored, Symmetrical chest wall expansion, Breath sounds are diminished at the right lung base, with lungs otherwise clear..    Chest wall:  No tenderness.   Back:  Nontender, Normal range of motion.    Musculoskeletal:  Normal ROM, normal strength, no swelling.    Gastrointestinal:  Soft, Nontender, Non distended.    Neurological:  Alert and  oriented to person, place, time, and situation, No focal neurological deficit observed.    Lymphatics:  No lymphadenopathy.      Medical Decision Making   Differential Diagnosis:  Viral syndrome, pneumonia, bronchitis, urinary tract infection, sepsis, influenza.    Documents reviewed:  Emergency department nurses' notes, flowsheet, emergency medical system run report, emergency department records, prior records, vital signs, EKG reviewed by myself.  Please see external review..    Results review:  Lab results : Lab View   12/06/2020 20:53 EST Estimated Creatinine Clearance 75.74 mL/min   12/06/2020 20:34 EST Sodium Lvl 128 mmol/L  LOW    Potassium Lvl 4.1 mmol/L    Chloride 90 mmol/L  LOW    CO2 25 mmol/L    Glucose Random 123 mg/dL  HI    BUN 17 mg/dL    Creatinine Lvl 1.0 mg/dL    AGAP 13 mmol/L    Osmolality Calc 260 mOsm/kg  LOW    Calcium Lvl 9.2 mg/dL    Protein Total 7.5 g/dL    Albumin Lvl 4.1 g/dL    Globulin Calc 3.4 g/dL    AG Ratio Calc 8.79 mmol/L    Alk Phos 141 unit/L  HI    AST 17 unit/L    ALT 12 unit/L    eGFR AA 89 mL/min/1.67m  LOW    eGFR Non-AA 76 mL/min/1.46m  LOW    Bili Total 0.39 mg/dL    Lactic Acid Lvl 0.8 mmol/L    Procalcitonin 0.09 ng/mL   12/06/2020 20:18 EST SARS-CoV-2 (LIAT) Not Detected    Influenza A (LIAT) Not Detected    Influenza B (LIAT) Not Detected   12/06/2020 20:15 EST UA Color Light Yellow    UA Appear Clear    UA Glucose Negative     UA Bili Negative    UA Ketones Negative    UA Spec Grav 1.020    UA Blood Trace    UA pH 7.0    Protein U Negative    UA Urobilinogen 0.2 EU/dL    UA Nitrite Negative    UA Leuk Est Negative   12/06/2020 20:14 EST WBC 11.8 x10e3/mcL  HI    RBC 4.59 x10e6/mcL    Hgb 12.9 g/dL  LOW    HCT 61.5 %    MCV 83.7 fL  LOW    MCH 28.1 pg    MCHC 33.6 g/dL    RDW 85.6 %    Platelet 225 x10e3/mcL    MPV 9.4 fL    Neutro Auto 87.5 %  HI    Neutro Absolute 10.3 x10e3/mcL  HI    Immature Grans Percent 0.3 %    Immature Grans Absolute 0.04 x10e3/mcL    Lymph Auto 4.9 %  LOW    Lymph Absolute 0.6 x10e3/mcL  LOW    Mono Auto 6.5 %    Mono Absolute 0.8 x10e3/mcL    Eosinophil Percent 0.6 %    Eos Absolute 0.1 x10e3/mcL    Basophil Auto 0.2 %    Baso Absolute 0.0 x10e3/mcL    NRBC Absolute Auto 0.000 x10e3/mcL    NRBC Percent Auto 0.0 %   .   Radiology results:  Rad Results (ST)   CT Thorax w/ Contrast  ?  12/06/20 21:33:18  CT chest with contrast: 12/06/20    INDICATION:Pneumonia, effusion or abscess suspected, xray done;[facility].    COMPARISON: Radiograph 12/06/2020    TECHNIQUE: Routine postcontrast protocol (Venous phase helical acquisition from  the  thoracic inlet through the hemidiaphragms with axial 5 x 5 mm soft tissue  and 2 x 2 mm lung algorithms. Lung algoritm coronal 3 x 3 mm reconstruction.) CT  scanning was performed using radiation dose reduction techniques when  appropriate, per system protocols.    Lungs/Airways: Multifocal consolidation throughout the mid and basal right lower  lobe. Multifocal patchy opacities to a lesser extent within the right middle  lobe. No focal airspace disease in the left lung.    Pleura: No pleural effusion or pneumothorax.    Lymph Nodes: No mediastinal, hilar, or axillary adenopathy.    Cardiovascular/Mediastinum: Normal heart size. No pericardial effusion.  Elevation of the right hemidiaphragm.    Osseous structures/Soft Tissues: No suspicious lytic or blastic osseous lesions.    Upper  Abdomen: Duodenal diverticulum. Subcentimeter left renal hypodensity,  likely a cyst. Cortical scarring in the lower pole the right kidney.    IMPRESSION:  Right basilar pneumonia involving the right lower and middle lobes.  ?  Signed By: CLAUDENE GOWER  ?  **************************************************  XR Chest 1 View Portable  ?  12/06/20 20:41:17  Chest AP: 12/06/20    INDICATION:Fever.    COMPARISON: None    FINDINGS: The cardiomediastinal silhouette is normal in size and contour. There  is elevation of the right hemidiaphragm. Airspace opacities in the right lung  base. No pneumothorax or pleural effusion. There are no acute osseous  abnormalities.    IMPRESSION:  Right basilar pneumonia.    Elevation of the right hemidiaphragm.  ?  Signed By: CLAUDENE GOWER  .   This patient has some chills and felt lightheaded prior to arrival.  Generally well-appearing and neurologically intact but has low-grade fever, tachycardia and diminished breath sounds in the right lung base.  He has had a cough.  Worrisome for pneumonia.  Will obtain chest x-ray and septic work-up.  He is notably hypertensive here, but says his systolic blood pressure generally runs around 185.  I will not immediately treat this, but we will continue to observe.      Reexamination/ Reevaluation   Patient appears to have right lower lobe pneumonia.  Chest x-ray appeared to have some elevation of the right hemidiaphragm with significant infiltrate, so given his history of cancer I have elected to obtain a CT of his thorax to further investigate.  This confirms pneumonia.  This would fit his clinical picture.  He is feeling much better.  Sats are in the mid 90s.  He is not feeling lightheaded when he stands.  Pressure has improved.  I have offered admission to the patient, but he has declined and would prefer to follow-up with his PCP when he returns to [place] this weekend.  I think this is reasonable.  I will double cover with  Augmentin and Zithromax.  While he is in town through Sunday I have advised he return to the ER with any worsening symptoms. Of note, his I did update his wife Charlies.  She reports that his sodium level runs at baseline around 129, so this is not an acute issue.  I have counseled her regarding his care here today and she is aware to bring him back with any concerning worsening symptoms. Will give imaging to share with his PCP as well.      Impression and Plan   Diagnosis   Pneumonia (ICD10-CM J18.9, Discharge, Medical)   Hyponatremia (ICD10-CM E87.1, Discharge, Medical)   Near syncope (ICD10-CM R55, Discharge, Medical)   Plan  Condition: Improved.    Disposition: Medically cleared, Discharged: Time  12/06/2020 22:10:00, to home.    Prescriptions: Launch prescriptions   Pharmacy:  Mucinex DM Maximum Strength 60 mg-1200 mg oral tablet, extended release (Prescribe): 1 tabs, Oral, BID, for 7 days, 14 tabs, 0 Refill(s)  Zithromax 250 mg oral tablet (Prescribe): 250 mg, 1 tabs, Oral, Daily, for 4 days, 4 tabs, 0 Refill(s)  Augmentin 875 mg-125 mg oral tablet (Prescribe): 1 tabs, Oral, BID, for 10 days, 20 tabs, 0 Refill(s).    Patient was given the following educational materials: Pneumonia (Adult).    Follow up with: Follow up with primary care provider Within 1 week Please return to the Emergency Department with any worsening or persistent symptoms. Please follow-up with recommended physician in the time frame listed. You should have a repeat chest x-ray in 1-2 weeks. Please call with any problems or concerns. If you have no primary care doctor, please call [phone] to establish care with one. Thank you for choosing [facility]!  SABRA    Counseled: I had a detailed discussion with the patient and/or guardian regarding the historical points/exam findings supporting the discharge diagnosis and need for outpatient followup. Discussed the need to return to the ER if symptoms persist/worsen, or for any questions/concerns that  arise at home.

## 2020-12-06 NOTE — ED Notes (Signed)
ED Triage Note       ED Triage Adult Entered On:  12/06/2020 19:49 EST    Performed On:  12/06/2020 19:45 EST by Lajean Saver, RN, CASANDRA J               Triage   Numeric Rating Pain Scale :   0 = No pain   Chief Complaint :   pr reports feeling lightheaded this evening and his family assisted him to the ground. reports that sometimes his BP will drop and he has to sit for a few moments. EMS gave LR    [facility] Mode of Arrival :   Ambulance   Infectious Disease Documentation :   Document assessment   Temperature Oral :   37.8 degC(Converted to: 100.0 degF)  (HI)    Heart Rate Monitored :   125 bpm (HI)    Respiratory Rate :   18 br/min   Systolic Blood Pressure :   210 mmHg (>HHI)    Diastolic Blood Pressure :   126 mmHg (>HHI)    SpO2 :   98 %   Oxygen Therapy :   Room air   Patient presentation :   None of the above   Chief Complaint or Presentation suggest infection :   No   Weight Dosing :   78.5 kg(Converted to: 173 lb 1 oz)    Height :   182 cm(Converted to: 6 ft 0 in)    Body Mass Index Dosing :   24 kg/m2   Lajean Saver RN, CASANDRA J - 12/06/2020 19:45 EST   DCP GENERIC CODE   Tracking Acuity :   3   Tracking Group :   ED [employer], RN, [employer] J - 12/06/2020 19:45 EST   ED General Section :   Document assessment   Pregnancy Status :   N/A   ED Allergies Section :   Document assessment   ED Reason for Visit Section :   Document assessment   ED Home Meds Section :   Document assessment   BALLO, RN, CASANDRA J - 12/06/2020 19:45 EST   ID Risk Screen Symptoms   Recent Travel History :   No recent travel   Close Contact with COVID-19 ID :   No   Last 14 days COVID-19 ID :   No   TB Symptom Screen :   No symptoms   C. diff Symptom/History ID :   Neither of the above   BALLO, RN, CASANDRA J - 12/06/2020 19:45 EST   Allergies   (As Of: 12/06/2020 19:49:54 EST)   Allergies (Active)   codeine  Estimated Onset Date:   Unspecified ; Reactions:   Stomach upset ; Created By:   Lajean Saver, RN, CASANDRA J; Reaction  Status:   Active ; Category:   Drug ; Substance:   codeine ; Type:   Allergy ; Severity:   Moderate ; Updated By:   Lajean Saver RN, Loyal Jacobson; Reviewed Date:   12/06/2020 19:48 EST        Psycho-Social   Last 3 mo, thoughts killing self/others :   Patient denies   Right click within box for Suspected Abuse policy link. :   None   Feels Safe Where Live :   Yes   ED Behavioral Activity Rating Scale :   4 - Quiet and awake (normal level of activity)   Lajean Saver, RN, Loyal Jacobson - 12/06/2020 19:45 EST   ED Home  Med List   Medication List   (As Of: 12/06/2020 19:49:54 EST)   Home Meds    amLODIPine  :   amLODIPine ; Status:   Documented ; Ordered As Mnemonic:   amLODIPine 5 mg oral tablet ; Simple Display Line:   5 mg, 1 tabs, Oral, Daily, 0 Refill(s) ; Catalog Code:   amLODIPine ; Order Dt/Tm:   12/06/2020 19:49:30 EST          cetirizine  :   cetirizine ; Status:   Documented ; Ordered As Mnemonic:   ZyrTEC ; Simple Display Line:   5 mg, Oral, Daily, PRN: allergy symptoms, 0 Refill(s) ; Catalog Code:   cetirizine ; Order Dt/Tm:   12/06/2020 19:49:47 EST          fluticasone nasal  :   fluticasone nasal ; Status:   Documented ; Ordered As Mnemonic:   Flonase 50 mcg/inh nasal spray ; Simple Display Line:   1 sprays, Nasal, BID, 16 g, 0 Refill(s) ; Catalog Code:   fluticasone nasal ; Order Dt/Tm:   12/06/2020 19:49:40 EST            ED Reason for Visit   (As Of: 12/06/2020 19:49:54 EST)   Problems(Active)    HTN (hypertension) (SNOMED CT  :6283151761 )  Name of Problem:   HTN (hypertension) ; Recorder:   Lajean Saver, RN, CASANDRA J; Confirmation:   Confirmed ; Classification:   Patient Stated ; Code:   [id number] ; Contributor System:   Dietitian ; Last Updated:   12/06/2020 19:46 EST ; Life Cycle Date:   12/06/2020 ; Life Cycle Status:   Active ; Vocabulary:   SNOMED CT        Squamous cell carcinoma of tongue (SNOMED CT  :948546270 )  Name of Problem:   Squamous cell carcinoma of tongue ; Recorder:   Lajean Saver, RN, CASANDRA J; Confirmation:    Confirmed ; Classification:   Patient Stated ; Code:   [id number] ; Contributor System:   Dietitian ; Last Updated:   12/06/2020 19:47 EST ; Life Cycle Date:   12/06/2020 ; Life Cycle Status:   Active ; Vocabulary:   SNOMED CT          Diagnoses(Active)    Syncope/Near syncope  Date:   12/06/2020 ; Diagnosis Type:   Reason For Visit ; Confirmation:   Complaint of ; Clinical Dx:   Syncope/Near syncope ; Classification:   Medical ; Clinical Service:   Non-Specified ; Code:   PNED ; Probability:   0 ; Diagnosis Code:   [id number]

## 2020-12-06 NOTE — ED Notes (Signed)
 ED Patient Summary              [facility] Emergency Department  [address] [place] [address]  [phone]  Discharge Instructions (Patient)  Name: Russell Phelps, Russell Phelps  DOB:  08-07-1951                   MRN: [id number]                   FIN: [id number]  Reason For Visit: Chills; Syncope/Near syncope; SYNCOPE  Final Diagnosis: Hyponatremia; Near syncope; Pneumonia     Visit Date: 12/06/2020 19:43:00  Address: [address] [place] [address]  Phone: [phone]     Emergency Department Providers:         Primary Physician:      CLAUDENE COY [facility] would like to thank you for allowing us  to assist you with your healthcare needs. The following includes patient education materials and information regarding your injury/illness.     Follow-up Instructions:  You were seen today on an emergency basis. Please contact your primary care doctor for a follow up appointment. If you received a referral to a specialist doctor, it is important you follow-up as instructed.    It is important that you call your follow-up doctor to schedule and confirm the location of your next appointment. Your doctor may practice at multiple locations. The office location of your follow-up appointment may be different to the one written on your discharge instructions.    If you do not have a primary care doctor, please call [phone] for help in finding a [facility] Provider. For help in finding a specialist doctor, please call [phone].    If your condition gets worse before your follow-up with your primary care doctor or specialist, please return to the Emergency Department.      Coronavirus 2019 (COVID-19) Reminders:     Patients age 3 - 30, with parental consent, and patients over age 43 can make an appointment for a COVID-19 vaccine. Patients can contact their [facility] doctors' offices to schedule an appointment to receive the  COVID-19 vaccine. Patients who do not have a [facility] physician can call [phone] to schedule vaccination appointments.      Follow Up Appointments:  Primary Care Provider:     Name: PCP,  NONE     Phone:                  With: Address: When:   Follow up with primary care provider  Within 1 week   Comments:   Please return to the Emergency Department with any worsening or persistent symptoms. Please follow-up with recommended physician in the time frame listed. You should have a repeat chest x-ray in 1-2 weeks. Please call with any problems or concerns. If you have no primary care doctor, please call [phone] to establish care with one. Thank you for choosing [facility]!                      New Medications  Printed Prescriptions  amoxicillin-clavulanate (Augmentin 875 mg-125 mg oral tablet) 1 Tabs Oral (given by mouth) 2 times a day for 10 Days. Refills: 0.  Last Dose:____________________  azithromycin (Zithromax 250 mg oral tablet) 1 Tabs Oral (given by mouth) every day for 4 Days. Refills: 0.  Last Dose:____________________  dextromethorphan-guaiFENesin (Mucinex DM Maximum Strength 60 mg-1200 mg oral tablet, extended release) 1 Tabs Oral (given by mouth) 2 times a day for 7 Days. Refills: 0.  Last Dose:____________________  Medications that have not changed  Other Medications  amLODIPine (amLODIPine 5 mg oral tablet) 1 Tabs Oral (given by mouth) every day.  Last Dose:____________________  cetirizine (ZyrTEC) 5 Milligram Oral (given by mouth) every day as needed allergy symptoms.  Last Dose:____________________  fluticasone nasal (Flonase 50 mcg/inh nasal spray) 1 Sprays Nasal (into the nose) 2 times a day.  Last Dose:____________________      Allergy Info: codeine     Discharge Additional Information          Discharge Patient 12/06/20 22:12:00 EST      Patient Education Materials:               Pneumonia (Adult)   Pneumonia is an infection deep in the lungs. It is in the small air sacs  (alveoli). It may be caused by a virus, fungus, or bacteria. Pneumonia caused by bacteriais often treated with an antibiotic. Severe cases may need to be treated in the hospital. Milder cases can be treated at home. Pneumonia symptoms are a lot like flu symptoms. They include fever, cough (dry or with phlegm), headache, muscle weakness, and pain. These symptoms often get worse in the first 2 days. But they often start to get better in the first week of treatment.           Home care   Follow these guidelines when caring for yourself at home:     Get plenty of rest. Don?t let yourself get overly tired when you go back to your activities. Participate in activities as directed by your healthcare provider.     Stop smoking. This is the most important step you can take to help treat pneumonia. If you need help stopping smoking, talk with your healthcare provider.     Stay away from smoke and other irritants. Stay away from secondhand smoke. Don?t let anyone smoke in your home.     Prevent lung infections. Ask your healthcare provider about the flu and pneumonia vaccines. Take steps to prevent colds and other lung infections.     Practice correct handwashing. Wash your hands often with soap and water. Use hand sanitizer when you can?t wash your hands. Stay away from crowds during cold and flu season.     Use pain medicine as directed. You may use acetaminophen or ibuprofen to control fever or pain, unless another medicine was prescribed. If you have chronic liver or kidney disease, talk with your healthcare provider before using these medicines. Also talk with your provider if you?ve had a stomach ulcer or GI (gastrointestinal) bleeding. Don?t give aspirin to a child younger than age 43 unless directed by the provider. Taking aspirin can put a child at risk for Reye syndrome. This is a rare but very serious disorder. It most often affects the brain and the liver.     Eat a light diet as needed. You may not feel  like eating, so a light diet is fine. Follow the treatment plan as advised by your healthcare provider.     Drink plenty of water and fluids. This can make mucus thinner and easier to cough up. Ask your healthcare provider how much water you should drink. For many people, 6 to 8 glasses (8 ounces each) a day is a good goal. Other fluids include sport drinks, sodas without caffeine,  juices, tea, or soup. If you also have heart or kidney disease, check with your provider before you drink extra fluids.     Finish all prescription medicine. Take antibiotic or antiviral medicine as prescribed by your healthcare provider, even if you are feeling better after a few days. Take the medicine until it is all gone.     Try to stay away from air pollution. If you live in an area with air pollution, track the Air Quality Index (AQI) reports and plan your outdoor activities with the AQI recommendations in mind    Follow-up care   Follow up with your healthcare provider in the next 2 to 3 days, or as advised. This is to be sure the medicine is helping you get better.   If you are 66 or older, you should get a pneumococcal vaccine and a yearlyflu (influenza)shot. You should also get these vaccines if you have chronic lung disease such as asthma, emphysema, or COPD. A second type of pneumonia vaccine is also available for people over age 67 and those younger than 71 with certain health conditions. Talk with your healthcare provider about which pneumococcal vaccine is best for you.   Call 911   Call 911 if any of these occur:     Unable to speak or swallow     Lips or skin looks blue, purple, or gray     Feeling dizzy or faint     Unable to wake up or loss of consciousness     Feeling of doom     Trouble breathing or wheezing     Shortness of breath gets worse or doesn't get better with treatment     Rapid breathing (more than 25 breaths per minute)     Coughing up blood     Chest pain gets worse with breathing or  doesn't get better with treatment    When to get medical advice   Call your healthcare provider right away if any of these occur:     You don?t get better in the first 2 days of treatment     Fever of100.63F (38C) or higher, or as directed by your healthcare provider     Shaking chills     Cough with phlegm that doesn't get better, or get worse     Shortness of breath with activities     Weakness, dizziness, or fainting that gets worse     Thirst or dry mouth that gets worse     Sinus pain, headache, or a stiff neck     Chest pain with breathing or coughing     Symptoms that get worse or not improving        2000-2021 [employer], [place]. All rights reserved. This information is not intended as a substitute for professional medical care. Always follow your healthcare professional's instructions.     ---------------------------------------------------------------------------------------------------------------------  [facility]) encourages you to self-enroll in the [facility] Patient Portal.  [facility] Patient Portal will allow you to manage your personal health information securely from your own electronic device now and in the future.  To begin your Patient Portal enrollment process, please visit [email or web address] Click on "Sign up now" under [facility].  If you find that you need additional assistance on the [facility] Patient Portal or need a copy of your medical records, please call the [facility] Medical Records Office at [phone].  Comment:

## 2020-12-06 NOTE — ED Notes (Signed)
ED Triage Note       ED Secondary Triage Entered On:  12/06/2020 19:50 EST    Performed On:  12/06/2020 19:49 EST by Lajean Saver, RN, CASANDRA J               General Information   Barriers to Learning :   None evident   COVID-19 Vaccine Status :   2 Doses received   ED Home Meds Section :   Document assessment   [facility] ED Fall Risk Section :   Document assessment   ED History Section :   Document assessment   ED Advance Directives Section :   Document assessment   ED Palliative Screen :   Document assessment   Lajean Saver RNLoyal Jacobson - 12/06/2020 19:49 EST   (As Of: 12/06/2020 19:50:32 EST)   Problems(Active)    HTN (hypertension) (SNOMED CT  :2505397673 )  Name of Problem:   HTN (hypertension) ; Recorder:   Lajean Saver, RN, CASANDRA J; Confirmation:   Confirmed ; Classification:   Patient Stated ; Code:   [id number] ; Contributor System:   Dietitian ; Last Updated:   12/06/2020 19:46 EST ; Life Cycle Date:   12/06/2020 ; Life Cycle Status:   Active ; Vocabulary:   SNOMED CT        Squamous cell carcinoma of tongue (SNOMED CT  :973532992 )  Name of Problem:   Squamous cell carcinoma of tongue ; Recorder:   Lajean Saver, RN, CASANDRA J; Confirmation:   Confirmed ; Classification:   Patient Stated ; Code:   [id number] ; Contributor System:   Dietitian ; Last Updated:   12/06/2020 19:47 EST ; Life Cycle Date:   12/06/2020 ; Life Cycle Status:   Active ; Vocabulary:   SNOMED CT          Diagnoses(Active)    Syncope/Near syncope  Date:   12/06/2020 ; Diagnosis Type:   Reason For Visit ; Confirmation:   Complaint of ; Clinical Dx:   Syncope/Near syncope ; Classification:   Medical ; Clinical Service:   Non-Specified ; Code:   PNED ; Probability:   0 ; Diagnosis Code:   [id number]             -    Procedure History   (As Of: 12/06/2020 19:50:32 EST)     [facility] Fall Risk Assessment Tool   Hx of falling last 3 months ED Fall :   No   Patient confused or disoriented ED Fall :   No   Patient intoxicated or sedated ED Fall :    No   Patient impaired gait ED Fall :   No   Use a mobility assistance device ED Fall :   No   Patient altered elimination ED Fall :   No   [facility] ED Fall Score :   0    Lajean Saver RN, Tad Moore J - 12/06/2020 19:49 EST   ED Advance Directive   Advance Directive :   No   Lajean Saver, RN, CASANDRA J - 12/06/2020 19:49 EST   Palliative Care   Does the Patient have a Life Limiting Illness :   None of the above   [place], RN, Loyal Jacobson - 12/06/2020 19:49 EST   Social History   Social History   (As Of: 12/06/2020 19:50:32 EST)   Tobacco:        Tobacco use: Never (less than 100 in lifetime).   (Last Updated: 12/06/2020 19:50:07 EST by Lajean Saver, RN, CASANDRA J)  Alcohol:        Current, 1-2 times per month   (Last Updated: 12/06/2020 19:50:13 EST by Lajean Saver, RN, CASANDRA J)          Substance Use:        Denies   (Last Updated: 12/06/2020 19:50:17 EST by Lajean Saver, RN, CASANDRA J)

## 2020-12-07 LAB — COMPREHENSIVE METABOLIC PANEL
ALT: 12 U/L (ref 0–41)
AST: 17 U/L (ref 0–40)
Albumin/Globulin Ratio: 1.2 mmol/L (ref 1.00–2.70)
Albumin: 4.1 g/dL (ref 3.5–5.2)
Alk Phosphatase: 141 U/L — ABNORMAL HIGH (ref 40–130)
Anion Gap: 13 mmol/L (ref 2–17)
BUN: 17 mg/dL (ref 8–23)
CO2: 25 mmol/L (ref 22–29)
Calcium: 9.2 mg/dL (ref 8.8–10.2)
Chloride: 90 mmol/L — ABNORMAL LOW (ref 98–107)
Creatinine: 1 mg/dL (ref 0.7–1.3)
GFR African American: 89 mL/min/{1.73_m2} — ABNORMAL LOW (ref >=90)
GFR Non-African American: 76 mL/min/{1.73_m2} — ABNORMAL LOW (ref >=90)
Globulin: 3.4 g/dL (ref 1.9–4.4)
Glucose: 123 mg/dL — ABNORMAL HIGH (ref 70–99)
OSMOLALITY CALCULATED: 260 mOsm/kg — ABNORMAL LOW (ref 270–287)
Potassium: 4.1 mmol/L (ref 3.5–5.3)
Sodium: 128 mmol/L — ABNORMAL LOW (ref 135–145)
Total Bilirubin: 0.39 mg/dL (ref 0.00–1.20)
Total Protein: 7.5 g/dL (ref 6.4–8.3)

## 2020-12-07 LAB — CBC WITH AUTO DIFFERENTIAL
Absolute Baso #: 0 10*3/uL (ref 0.0–0.2)
Absolute Eos #: 0.1 10*3/uL (ref 0.0–0.5)
Absolute Lymph #: 0.6 10*3/uL — ABNORMAL LOW (ref 1.0–3.2)
Absolute Mono #: 0.8 10*3/uL (ref 0.3–1.0)
Basophils %: 0.2 % (ref 0.0–2.0)
Eosinophils %: 0.6 % (ref 0.0–7.0)
Hematocrit: 38.4 % (ref 38.0–52.0)
Hemoglobin: 12.9 g/dL — ABNORMAL LOW (ref 13.0–17.3)
Immature Grans (Abs): 0.04 10*3/uL (ref 0.00–0.06)
Immature Granulocytes: 0.3 % (ref 0.1–0.6)
Lymphocytes: 4.9 % — ABNORMAL LOW (ref 15.0–45.0)
MCH: 28.1 pg (ref 27.0–34.5)
MCHC: 33.6 g/dL (ref 32.0–36.0)
MCV: 83.7 fL — ABNORMAL LOW (ref 84.0–100.0)
MPV: 9.4 fL (ref 7.2–13.2)
Monocytes: 6.5 % (ref 4.0–12.0)
NRBC Absolute: 0 10*3/uL (ref 0.000–0.012)
NRBC Automated: 0 % (ref 0.0–0.2)
Neutrophils %: 87.5 % — ABNORMAL HIGH (ref 42.0–74.0)
Neutrophils Absolute: 10.3 10*3/uL — ABNORMAL HIGH (ref 1.6–7.3)
Platelets: 225 10*3/uL (ref 140–440)
RBC: 4.59 x10e6/mcL (ref 4.00–5.60)
RDW: 14.3 % (ref 11.0–16.0)
WBC: 11.8 10*3/uL — ABNORMAL HIGH (ref 3.8–10.6)

## 2020-12-07 LAB — LACTIC ACID: Lactic Acid: 0.8 mmol/L (ref 0.5–2.0)

## 2020-12-07 LAB — PROCALCITONIN: Procalcitonin: 0.09 ng/mL (ref <=0.24)

## 2020-12-07 LAB — COVID-19 & INFLUENZA COMBO
INFLUENZA A: NOT DETECTED
INFLUENZA B: NOT DETECTED
SARS-CoV-2: NOT DETECTED

## 2020-12-07 LAB — URINALYSIS W/ RFLX MICROSCOPIC
Bilirubin Urine: NEGATIVE
Glucose, UA: NEGATIVE mg/dL
Ketones, Urine: NEGATIVE mg/dL
Leukocyte Esterase, Urine: NEGATIVE
Nitrite, Urine: NEGATIVE
Protein, UA: NEGATIVE
Specific Gravity, UA: 1.02 (ref 1.003–1.035)
Urobilinogen, Urine: 0.2 EU/dL
pH, UA: 7 (ref 4.5–8.0)

## 2020-12-07 LAB — CULTURE, URINE: FINAL REPORT: NO GROWTH

## 2020-12-11 ENCOUNTER — Inpatient Hospital Stay (HOSPITAL_BASED_OUTPATIENT_CLINIC_OR_DEPARTMENT_OTHER)
Admission: EM | Admit: 2020-12-11 | Discharge: 2020-12-13 | DRG: 640 | Disposition: A | Discharge status: Home / Self Care | Payer: Medicare Other | Attending: Internal Medicine | Admitting: Internal Medicine

## 2020-12-11 ENCOUNTER — Ambulatory Visit
Admission: RE | Admit: 2020-12-11 | Discharge: 2020-12-11 | Disposition: A | Discharge status: Home / Self Care | Payer: Medicare Other | Source: Ambulatory Visit | Attending: Family Medicine | Admitting: Family Medicine

## 2020-12-11 ENCOUNTER — Inpatient Hospital Stay: Payer: Medicare Other | Admitting: Family Medicine

## 2020-12-11 ENCOUNTER — Encounter: Payer: Self-pay | Admitting: Family Medicine

## 2020-12-11 ENCOUNTER — Ambulatory Visit (INDEPENDENT_AMBULATORY_CARE_PROVIDER_SITE_OTHER): Payer: Medicare Other | Admitting: Family Medicine

## 2020-12-11 ENCOUNTER — Other Ambulatory Visit: Payer: Self-pay

## 2020-12-11 ENCOUNTER — Encounter (HOSPITAL_BASED_OUTPATIENT_CLINIC_OR_DEPARTMENT_OTHER): Payer: Self-pay | Admitting: *Deleted

## 2020-12-11 ENCOUNTER — Telehealth: Payer: Self-pay

## 2020-12-11 VITALS — BP 162/96 | HR 84 | Temp 97.6°F | Wt 165.5 lb

## 2020-12-11 DIAGNOSIS — Z923 Personal history of irradiation: Secondary | ICD-10-CM

## 2020-12-11 DIAGNOSIS — Z85819 Personal history of malignant neoplasm of unspecified site of lip, oral cavity, and pharynx: Secondary | ICD-10-CM | POA: Diagnosis present

## 2020-12-11 DIAGNOSIS — Z20822 Contact with and (suspected) exposure to covid-19: Secondary | ICD-10-CM | POA: Diagnosis present

## 2020-12-11 DIAGNOSIS — Z885 Allergy status to narcotic agent status: Secondary | ICD-10-CM

## 2020-12-11 DIAGNOSIS — Z85818 Personal history of malignant neoplasm of other sites of lip, oral cavity, and pharynx: Secondary | ICD-10-CM | POA: Diagnosis not present

## 2020-12-11 DIAGNOSIS — Z79899 Other long term (current) drug therapy: Secondary | ICD-10-CM

## 2020-12-11 DIAGNOSIS — R5383 Other fatigue: Secondary | ICD-10-CM

## 2020-12-11 DIAGNOSIS — I10 Essential (primary) hypertension: Secondary | ICD-10-CM | POA: Diagnosis present

## 2020-12-11 DIAGNOSIS — J189 Pneumonia, unspecified organism: Secondary | ICD-10-CM | POA: Diagnosis not present

## 2020-12-11 DIAGNOSIS — E871 Hypo-osmolality and hyponatremia: Principal | ICD-10-CM | POA: Diagnosis present

## 2020-12-11 DIAGNOSIS — Z808 Family history of malignant neoplasm of other organs or systems: Secondary | ICD-10-CM

## 2020-12-11 DIAGNOSIS — Z8249 Family history of ischemic heart disease and other diseases of the circulatory system: Secondary | ICD-10-CM

## 2020-12-11 DIAGNOSIS — R1310 Dysphagia, unspecified: Secondary | ICD-10-CM

## 2020-12-11 DIAGNOSIS — E878 Other disorders of electrolyte and fluid balance, not elsewhere classified: Secondary | ICD-10-CM

## 2020-12-11 DIAGNOSIS — R112 Nausea with vomiting, unspecified: Secondary | ICD-10-CM | POA: Diagnosis not present

## 2020-12-11 DIAGNOSIS — Z9221 Personal history of antineoplastic chemotherapy: Secondary | ICD-10-CM

## 2020-12-11 DIAGNOSIS — J309 Allergic rhinitis, unspecified: Secondary | ICD-10-CM | POA: Diagnosis present

## 2020-12-11 DIAGNOSIS — J690 Pneumonitis due to inhalation of food and vomit: Secondary | ICD-10-CM | POA: Diagnosis present

## 2020-12-11 LAB — CBC WITH DIFFERENTIAL/PLATELET
Basophils Absolute: 0 10*3/uL (ref 0.0–0.2)
Basos: 0 %
EOS (ABSOLUTE): 0 10*3/uL (ref 0.0–0.4)
Eos: 0 %
Hematocrit: 36.9 % — ABNORMAL LOW (ref 37.5–51.0)
Hemoglobin: 12.9 g/dL — ABNORMAL LOW (ref 13.0–17.7)
Lymphocytes Absolute: 0.4 10*3/uL — ABNORMAL LOW (ref 0.7–3.1)
Lymphs: 6 %
MCH: 28.2 pg (ref 26.6–33.0)
MCHC: 35 g/dL (ref 31.5–35.7)
MCV: 81 fL (ref 79–97)
Monocytes Absolute: 0.8 10*3/uL (ref 0.1–0.9)
Monocytes: 11 %
Neutrophils Absolute: 5.6 10*3/uL (ref 1.4–7.0)
Neutrophils: 83 %
Platelets: 265 10*3/uL (ref 150–450)
RBC: 4.58 x10E6/uL (ref 4.14–5.80)
RDW: 15.1 % (ref 11.6–15.4)
WBC: 6.8 10*3/uL (ref 3.4–10.8)

## 2020-12-11 LAB — BASIC METABOLIC PANEL
Anion gap: 10 (ref 5–15)
BUN/Creatinine Ratio: 16 (ref 10–24)
BUN: 11 mg/dL (ref 8–27)
BUN: 11 mg/dL (ref 8–23)
CO2: 26 mmol/L (ref 20–29)
CO2: 25 mmol/L (ref 22–32)
Calcium: 9.3 mg/dL (ref 8.6–10.2)
Calcium: 9.5 mg/dL (ref 8.9–10.3)
Chloride: 81 mmol/L — ABNORMAL LOW (ref 96–106)
Chloride: 82 mmol/L — ABNORMAL LOW (ref 98–111)
Creatinine, Ser: 0.68 mg/dL — ABNORMAL LOW (ref 0.76–1.27)
Creatinine, Ser: 0.79 mg/dL (ref 0.61–1.24)
GFR calc Af Amer: 113 mL/min/{1.73_m2} (ref >59)
GFR calc non Af Amer: 97 mL/min/{1.73_m2} (ref >59)
GFR, Estimated: >60 mL/min (ref >60)
Glucose, Bld: 110 mg/dL — ABNORMAL HIGH (ref 70–99)
Glucose: 108 mg/dL — ABNORMAL HIGH (ref 65–99)
Potassium: 4.5 mmol/L (ref 3.5–5.2)
Potassium: 4.2 mmol/L (ref 3.5–5.1)
Sodium: 116 mmol/L — CL (ref 134–144)
Sodium: 117 mmol/L — CL (ref 135–145)

## 2020-12-11 LAB — CBC
HCT: 38.8 % — ABNORMAL LOW (ref 39.0–52.0)
Hemoglobin: 13.8 g/dL (ref 13.0–17.0)
MCH: 28 pg (ref 26.0–34.0)
MCHC: 35.6 g/dL (ref 30.0–36.0)
MCV: 78.9 fL — ABNORMAL LOW (ref 80.0–100.0)
Platelets: 263 10*3/uL (ref 150–400)
RBC: 4.92 MIL/uL (ref 4.22–5.81)
RDW: 13.2 % (ref 11.5–15.5)
WBC: 6.8 10*3/uL (ref 4.0–10.5)
nRBC: 0 % (ref 0.0–0.2)

## 2020-12-11 LAB — SARS CORONAVIRUS 2 BY RT PCR (HOSPITAL ORDER, PERFORMED IN ~~LOC~~ HOSPITAL LAB): SARS Coronavirus 2: NEGATIVE

## 2020-12-11 MED ORDER — SODIUM CHLORIDE 0.9 % IV BOLUS
1000.0000 mL | Freq: Once | INTRAVENOUS | Status: AC
  Administered 2020-12-11: 1000 mL via INTRAVENOUS

## 2020-12-11 MED ORDER — AMLODIPINE BESYLATE 5 MG PO TABS
5.0000 mg | ORAL_TABLET | Freq: Once | ORAL | Status: AC
  Administered 2020-12-11: 5 mg via ORAL
  Filled 2020-12-11: qty 1

## 2020-12-11 MED ORDER — PROMETHAZINE HCL 25 MG/ML IJ SOLN
25.0000 mg | Freq: Once | INTRAMUSCULAR | Status: DC
  Administered 2020-12-11: 25 mg via INTRAMUSCULAR

## 2020-12-11 MED ORDER — SODIUM CHLORIDE 0.9 % IV BOLUS
500.0000 mL | Freq: Once | INTRAVENOUS | Status: AC
  Administered 2020-12-11: 500 mL via INTRAVENOUS

## 2020-12-11 MED ORDER — AMOXICILLIN-POT CLAVULANATE 875-125 MG PO TABS
1.0000 | ORAL_TABLET | Freq: Once | ORAL | Status: AC
  Administered 2020-12-11: 1 via ORAL
  Filled 2020-12-11: qty 1

## 2020-12-11 MED ORDER — SODIUM CHLORIDE 0.9 % IV BOLUS: 500.0000 mL | Freq: Once | INTRAVENOUS | Status: DC

## 2020-12-11 NOTE — Progress Notes (Unsigned)
Subjective:    Patient ID: Lee Barnes, male    DOB: 1951-05-11, 70 y.o.   MRN: [id number]  HPI He is here for a recheck. Wednesday of last week he had the sudden onset of nausea, vomiting, fever, chills and slight cough. He was seen in an emergency room in another city. Apparently x-rays did show some right lobe pneumonia. He also was tested for Covid and apparently this was negative. He was placed on azithromycin as well as Augmentin. He came back here for further care. He still complains of not feeling any better since starting the antibiotic. He does have a previous history of hypo-   Review of Systems     Objective:   Physical Exam Alert and. Tympanic membranes and canals are normal. Pharyngeal area is normal. Neck is supple without adenopathy or thyromegaly. Cardiac exam shows a regular sinus rhythm without murmurs or gallops. Lungs are clear to auscultation.        Assessment & Plan:  Pneumonia of right lung due to infectious organism, unspecified part of lung - Plan: CBC with Differential/Platelet, DG Chest 2 View, Basic Metabolic Panel  Fatigue, unspecified type - Plan: POC COVID-19, Novel Coronavirus, NAA ([facility])  Electrolyte abnormality  Nausea and vomiting, intractability of vomiting not specified, unspecified vomiting type - Plan: promethazine (PHENERGAN) injection 25 mg  The x-ray did show evidence of pneumonia. Blood work showed a sodium of 116. I called his wife and had her take him immediately to the hospital for emergent care.

## 2020-12-11 NOTE — ED Triage Notes (Signed)
He was seen by his MD today and had blood drawn. His sodium was 117. Pt is ambulatory. Alert oriented. States he started feeling bad 5 days. He was out of town and seen in the ER where his sodium was on the lower side but not as low as today. Hx of chronic low sodium.

## 2020-12-11 NOTE — ED Notes (Signed)
Pt  From home with nausea, anorexia, weakness x 5 days. Pt also reports chills/hot flashes. Pt went to PCP today, who found that his sodium level is 117. Pt reports that this has hx of the same, apparently as a result of chemo drugs he took a few years ago. Pt alert & oriented, nad noted.

## 2020-12-11 NOTE — ED Provider Notes (Signed)
[facility] EMERGENCY DEPARTMENT Provider Note   CSN: [id number] Arrival date & time: 12/11/20  1701     History Chief Complaint  Patient presents with  . Abnormal Lab    Lee Barnes is a 70 y.o. male with a hx of hypertension, & oropharynx cancer S/p radiation & cisplatin who presents to the ED at advisement of PCP office for hyponatremia on labs drawn today in clinic. Patient states he has felt poorly over the past 1 week, was seen at an ER in [place] 12/06/20 and was found to have pnuemonia of the right lower lobe, covid negative, sodium was 128 at that time, discharged on azithromycin & augmentin. Taking abx as prescribed. Has had improvement in his cough which is lingering some but has had decreased appetite, poor PO intake, nausea, & 1 episode of emesis earlier today. He feels generally weak. Seen at PCP office today- labs revealed Na 116 and he was told to come to the ED. He states he has problems with chronic hyponatremia in the upper 120s to 131 range after taking cisplatin for his cancer previously. He had required admission for Na of 114 in the past. He denies fever, chills, chest pain, dyspnea, abdominal pain, seizure activity, diarrhea or hematemesis.   HPI     Past Medical History:  Diagnosis Date  . Allergy to environmental factors   . Complication of anesthesia    "vageled" after surgery -overnite stay  . Diverticulosis 2008  . Heart murmur    hx of in childhood   . History of chemotherapy   . History of radiation therapy 11/05/10-12/26/10   r base tongue, 7000 cGy 35 sessions  . Hypertension    Medication started in May 2016.  Marland Kitchen Oropharynx cancer ([place]) 09/2010  . Pneumonia    hx of 2012  . 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 Tinnitus     Patient Active Problem List   Diagnosis Date Noted  . 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 ACE-inhibitor cough 05/25/2015  .  Essential hypertension 03/21/2015  . Allergic rhinitis 12/14/2013  . ED (erectile dysfunction) 12/14/2013  . Diverticulosis   . History of oropharyngeal cancer 09/18/2010    Past Surgical History:  Procedure Laterality Date  . APPENDECTOMY    . COLONOSCOPY    . INGUINAL HERNIA REPAIR Bilateral 03/30/2015   Procedure: LAPAROSCOPIC BILATERAL INGUINAL HERNIA REPAIR WITH MESH;  Surgeon: Coralie Keens, MD;  Location: [facility];  Service: General;  Laterality: Bilateral;  . INGUINAL HERNIA REPAIR Right 1960  . INGUINAL HERNIA REPAIR Bilateral 03/30/2015  . INGUINAL HERNIA REPAIR Left 07/11/2015  . INGUINAL HERNIA REPAIR N/A 07/11/2015   Procedure: REPAIR OF LEFT INGUINAL HERNIA WITH MESH;  Surgeon: Coralie Keens, MD;  Location: [facility];  Service: General;  Laterality: N/A;  . INSERTION OF MESH N/A 07/11/2015   Procedure: INSERTION OF MESH;  Surgeon: Coralie Keens, MD;  Location: [facility];  Service: General;  Laterality: N/A;  . LUMBAR [place]    . TONSILLECTOMY         Family History  Problem Relation Age of Onset  . Heart disease Father   . Cancer Father        throat ca  . Heart disease Mother   . Colon cancer Neg Hx     Social History   Tobacco Use  . Smoking status: Never Smoker  . Smokeless tobacco: Never Used  Substance Use Topics  . Alcohol use: Yes    Comment: occas drinks a beer every few weeks   . Drug use: No    Comment:      Home Medications Prior to Admission medications   Medication Sig Start Date End Date Taking? Authorizing Provider  acetaminophen (TYLENOL) 500 MG tablet Take 500 mg by mouth every 5 hours    [provider]  Amino Acids (AMINO ACID PO) Take 5 tablets by mouth 2 (two) times daily. Patient not taking: Reported on 12/11/2020    [provider]  amLODipine (NORVASC) 5 MG tablet TAKE ONE TABLET BY MOUTH DAILY 11/28/20   Denita Lung, MD  amoxicillin (AMOXIL) 250 MG/5ML suspension Take 10 mLs (500 mg total) by mouth 3  (three) times daily. Patient not taking: No sig reported 03/11/18   Mesner, Corene Cornea, MD  amoxicillin-clavulanate (AUGMENTIN) 875-125 MG tablet Take 1 tablet by mouth 2 (two) times daily.    [provider]  cetirizine (ZYRTEC) 10 MG tablet Take 10 mg by mouth at bedtime.  Patient not taking: Reported on 12/11/2020    [provider]  chlorhexidine (PERIDEX) 0.12 % solution 15 mLs by Mouth Rinse route 2 (two) times daily. Patient not taking: Reported on 12/11/2020 03/25/18   [provider]  Cyanocobalamin (VITAMIN B-12 PO) Take 1 tablet by mouth daily. Patient not taking: Reported on 12/11/2020    [provider]  feeding supplement (BOOST HIGH PROTEIN) LIQD Take 1 Container by mouth 2 (two) times daily between meals.  Patient not taking: Reported on 12/11/2020    [provider]  fluticasone (FLONASE) 50 MCG/ACT nasal spray INSTILL TWO SPRAYS INTO EACH NOSTRIL DAILY AS NEEDED 09/05/20   Denita Lung, MD  ibuprofen (ADVIL,MOTRIN) 100 MG/5ML suspension Take 10 mLs by mouth every 6 (six) hours as needed for pain. Patient not taking: Reported on 12/11/2020 03/25/18   [provider]  Magnesium Oxide (MAG-OX 400 PO) Take 400 mg by mouth daily. Patient not taking: Reported on 12/11/2020    [provider]  NON FORMULARY "Men's Health" capsules: Take 1 capsule by mouth once time a day Patient not taking: Reported on 12/11/2020    [provider]  ondansetron (ZOFRAN) 8 MG tablet Take 1 tablet (8 mg total) by mouth every 8 (eight) hours as needed for nausea or vomiting. Patient not taking: Reported on 12/11/2020 03/27/18   Denita Lung, MD  sildenafil (VIAGRA) 100 MG tablet Take 0.5-1 tablets (50-100 mg total) by mouth daily as needed for erectile dysfunction. Patient not taking: Reported on 12/11/2020 03/30/19   Denita Lung, MD    Allergies    Codeine  Review of Systems   Review of Systems  Constitutional: Positive for appetite  change. Negative for chills and fever.  HENT: Negative for congestion and sore throat.   Respiratory: Positive for cough (improving). Negative for shortness of breath.   Cardiovascular: Negative for chest pain.  Gastrointestinal: Positive for nausea and vomiting. Negative for abdominal pain, blood in stool, constipation and diarrhea.  Neurological: Negative for seizures and syncope.  All other systems reviewed and are negative.   Physical Exam Updated Vital Signs BP (!) 173/115   Pulse 79   Temp 97.9 F (36.6 C) (Oral)   Resp 20   Ht 6' (1.829 m)   Wt 74.4 kg   SpO2 96%   BMI 22.24 kg/m   Physical Exam Vitals and nursing note reviewed.  Constitutional:  General: He is not in acute distress.    Appearance: He is well-developed. He is not toxic-appearing.  HENT:     Head: Normocephalic and atraumatic.     Mouth/Throat:     Mouth: Mucous membranes are dry.  Eyes:     General:        Right eye: No discharge.        Left eye: No discharge.     Conjunctiva/sclera: Conjunctivae normal.  Cardiovascular:     Rate and Rhythm: Normal rate and regular rhythm.  Pulmonary:     Effort: Pulmonary effort is normal. No respiratory distress.     Breath sounds: Normal breath sounds. No wheezing, rhonchi or rales.  Abdominal:     General: There is no distension.     Palpations: Abdomen is soft.     Tenderness: There is no abdominal tenderness. There is no guarding or rebound.  Musculoskeletal:     Cervical back: Neck supple.  Skin:    General: Skin is warm and dry.     Findings: No rash.  Neurological:     Mental Status: He is alert.     Comments: Clear speech.   Psychiatric:        Behavior: Behavior normal.     ED Results / Procedures / Treatments   Labs (all labs ordered are listed, but only abnormal results are displayed) Labs Reviewed - No data to display  EKG None  Radiology DG Chest 2 View  Result Date: 12/11/2020 CLINICAL DATA:  Follow-up pneumonia EXAM:  CHEST - 2 VIEW COMPARISON:  03/11/2018 FINDINGS: Streaky airspace disease in the right middle and lower lobes. Diaphragm on the right appears slightly elevated. No pleural effusion. Normal heart size. No pneumothorax. IMPRESSION: Streaky airspace disease in the right middle and lower lobes, may reflect residual pneumonia. Imaging follow-up to resolution is recommended. Electronically Signed   By: Donavan Foil M.D.   On: 12/11/2020 17:28    Procedures .Critical Care Performed by: Amaryllis Dyke, PA-C Authorized by: Amaryllis Dyke, PA-C    CRITICAL CARE Performed by: Kennith Maes   Total critical care time: 30 minutes  Critical care time was exclusive of separately billable procedures and treating other patients.  Critical care was necessary to treat or prevent imminent or life-threatening deterioration.  Critical care was time spent personally by me on the following activities: development of treatment plan with patient and/or surrogate as well as nursing, discussions with consultants, evaluation of patient's response to treatment, examination of patient, obtaining history from patient or surrogate, ordering and performing treatments and interventions, ordering and review of laboratory studies, ordering and review of radiographic studies, pulse oximetry and re-evaluation of patient's condition.   Medications Ordered in ED Medications  sodium chloride 0.9 % bolus 500 mL (has no administration in time range)  sodium chloride 0.9 % bolus 1,000 mL (1,000 mLs Intravenous New Bag/Given 12/11/20 1833)  amLODipine (NORVASC) tablet 5 mg (5 mg Oral Given 12/11/20 1827)    ED Course  I have reviewed the triage vital signs and the nursing notes.  Pertinent labs & imaging results that were available during my care of the patient were reviewed by me and considered in my medical decision making (see chart for details).    MDM Rules/Calculators/A&P                          Patient presents to the ED with complaints of abnormal labs.  Nontoxic,  BP notably elevated- hx of HTN, does get white coat hypertension, has not had night time BP meds- will give norvasc. On exam his MM are dry.   Additional history obtained:  Additional history obtained from chart review & nursing note review. Echocardiogram from 01/2019: Normal LV size & systolic function, EF> [id number]. Normal RV size & systolic function. There is trival tricuspid regurgitation. No pulmonary HTN. No evidence of ischemia w/ stress. Overal global LV systolic function improved post stress.   Labs from today:  CBC: Anemia similar to prior.  BMP: hyponatremia @ 116. Creatinine WNL.   CXR today:Streaky airspace disease in the right middle and lower lobes, may reflect residual pneumonia. Imaging follow-up to resolution is recommended. Patient symptomatically improving, would expect lag in imaging to reflect this.   ED Course:  Suspect patient is dehydrated. Will give 1500 cc NS in the ED. Repeat labs ordered and similar to earlier today.  COVID negative.  Plan for admission- patient & his wife are in agreement.   19:20: CONSULT: Discussed with hospitalist Dr. Clearence Ped - accepts admission.   Findings and plan of care discussed with supervising physician Dr. Rex Kras who has evaluated patient as shared visit and is in agreement.   Portions of this note were generated with [employer]. Dictation errors may occur despite best attempts at proofreading.  Final Clinical Impression(s) / ED Diagnoses Final diagnoses:  Hyponatremia    Rx / DC Orders ED Discharge Orders    None       Leafy Kindle 12/11/20 2008    Little, Wenda Overland, MD 12/13/20 [phone]

## 2020-12-11 NOTE — Telephone Encounter (Signed)
Received a call form the stat lab on his Sodium level it was 116 critically low from the normal range of 134-144.

## 2020-12-12 DIAGNOSIS — J690 Pneumonitis due to inhalation of food and vomit: Secondary | ICD-10-CM | POA: Diagnosis present

## 2020-12-12 DIAGNOSIS — Z885 Allergy status to narcotic agent status: Secondary | ICD-10-CM | POA: Diagnosis not present

## 2020-12-12 DIAGNOSIS — Z85818 Personal history of malignant neoplasm of other sites of lip, oral cavity, and pharynx: Secondary | ICD-10-CM | POA: Diagnosis not present

## 2020-12-12 DIAGNOSIS — R1310 Dysphagia, unspecified: Secondary | ICD-10-CM | POA: Diagnosis present

## 2020-12-12 DIAGNOSIS — E871 Hypo-osmolality and hyponatremia: Secondary | ICD-10-CM | POA: Diagnosis not present

## 2020-12-12 DIAGNOSIS — J301 Allergic rhinitis due to pollen: Secondary | ICD-10-CM | POA: Diagnosis not present

## 2020-12-12 DIAGNOSIS — Z9221 Personal history of antineoplastic chemotherapy: Secondary | ICD-10-CM | POA: Diagnosis not present

## 2020-12-12 DIAGNOSIS — J309 Allergic rhinitis, unspecified: Secondary | ICD-10-CM | POA: Diagnosis present

## 2020-12-12 DIAGNOSIS — Z20822 Contact with and (suspected) exposure to covid-19: Secondary | ICD-10-CM | POA: Diagnosis present

## 2020-12-12 DIAGNOSIS — Z79899 Other long term (current) drug therapy: Secondary | ICD-10-CM | POA: Diagnosis not present

## 2020-12-12 DIAGNOSIS — Z8249 Family history of ischemic heart disease and other diseases of the circulatory system: Secondary | ICD-10-CM | POA: Diagnosis not present

## 2020-12-12 DIAGNOSIS — Z923 Personal history of irradiation: Secondary | ICD-10-CM | POA: Diagnosis not present

## 2020-12-12 DIAGNOSIS — I10 Essential (primary) hypertension: Secondary | ICD-10-CM | POA: Diagnosis present

## 2020-12-12 DIAGNOSIS — Z808 Family history of malignant neoplasm of other organs or systems: Secondary | ICD-10-CM | POA: Diagnosis not present

## 2020-12-12 LAB — CULTURE, BLOOD 1

## 2020-12-12 LAB — URINALYSIS, ROUTINE W REFLEX MICROSCOPIC
Bacteria, UA: NONE SEEN
Bilirubin Urine: NEGATIVE
Glucose, UA: NEGATIVE mg/dL
Ketones, ur: NEGATIVE mg/dL
Leukocytes,Ua: NEGATIVE
Nitrite: NEGATIVE
Protein, ur: NEGATIVE mg/dL
Specific Gravity, Urine: 1.008 (ref 1.005–1.030)
pH: 6 (ref 5.0–8.0)

## 2020-12-12 LAB — BASIC METABOLIC PANEL
Anion gap: 8 (ref 5–15)
Anion gap: 8 (ref 5–15)
BUN: 11 mg/dL (ref 8–23)
BUN: 16 mg/dL (ref 8–23)
CO2: 24 mmol/L (ref 22–32)
CO2: 26 mmol/L (ref 22–32)
Calcium: 8.7 mg/dL — ABNORMAL LOW (ref 8.9–10.3)
Calcium: 9 mg/dL (ref 8.9–10.3)
Chloride: 89 mmol/L — ABNORMAL LOW (ref 98–111)
Chloride: 89 mmol/L — ABNORMAL LOW (ref 98–111)
Creatinine, Ser: 0.89 mg/dL (ref 0.61–1.24)
Creatinine, Ser: 1.24 mg/dL (ref 0.61–1.24)
GFR, Estimated: >60 mL/min (ref >60)
GFR, Estimated: >60 mL/min (ref >60)
Glucose, Bld: 87 mg/dL (ref 70–99)
Glucose, Bld: 118 mg/dL — ABNORMAL HIGH (ref 70–99)
Potassium: 3.9 mmol/L (ref 3.5–5.1)
Potassium: 3.9 mmol/L (ref 3.5–5.1)
Sodium: 121 mmol/L — ABNORMAL LOW (ref 135–145)
Sodium: 123 mmol/L — ABNORMAL LOW (ref 135–145)

## 2020-12-12 LAB — COMPREHENSIVE METABOLIC PANEL
ALT: 24 U/L (ref 0–44)
AST: 31 U/L (ref 15–41)
Albumin: 4.5 g/dL (ref 3.5–5.0)
Alkaline Phosphatase: 99 U/L (ref 38–126)
Anion gap: 12 (ref 5–15)
BUN: 15 mg/dL (ref 8–23)
CO2: 24 mmol/L (ref 22–32)
Calcium: 9.4 mg/dL (ref 8.9–10.3)
Chloride: 88 mmol/L — ABNORMAL LOW (ref 98–111)
Creatinine, Ser: 0.91 mg/dL (ref 0.61–1.24)
GFR, Estimated: >60 mL/min (ref >60)
Glucose, Bld: 89 mg/dL (ref 70–99)
Potassium: 3.7 mmol/L (ref 3.5–5.1)
Sodium: 124 mmol/L — ABNORMAL LOW (ref 135–145)
Total Bilirubin: 0.3 mg/dL (ref 0.3–1.2)
Total Protein: 8.4 g/dL — ABNORMAL HIGH (ref 6.5–8.1)

## 2020-12-12 LAB — TSH: TSH: 4.493 u[IU]/mL (ref 0.350–4.500)

## 2020-12-12 LAB — POC COVID19 BINAXNOW: SARS Coronavirus 2 Ag: NEGATIVE

## 2020-12-12 LAB — NOVEL CORONAVIRUS, NAA: SARS-CoV-2, NAA: NOT DETECTED

## 2020-12-12 LAB — SARS-COV-2, NAA 2 DAY TAT

## 2020-12-12 LAB — BRAIN NATRIURETIC PEPTIDE: B Natriuretic Peptide: 17.8 pg/mL (ref 0.0–100.0)

## 2020-12-12 LAB — HIV ANTIBODY (ROUTINE TESTING W REFLEX): HIV Screen 4th Generation wRfx: NONREACTIVE

## 2020-12-12 MED ORDER — ENOXAPARIN SODIUM 40 MG/0.4ML ~~LOC~~ SOLN
40.0000 mg | SUBCUTANEOUS | Status: DC
  Administered 2020-12-12: 40 mg via SUBCUTANEOUS
  Filled 2020-12-12: qty 0.4

## 2020-12-12 MED ORDER — AMLODIPINE BESYLATE 5 MG PO TABS
5.0000 mg | ORAL_TABLET | Freq: Every day | ORAL | Status: DC
  Administered 2020-12-12 – 2020-12-13 (×2): 5 mg via ORAL
  Filled 2020-12-12 (×2): qty 1

## 2020-12-12 MED ORDER — ACETAMINOPHEN 650 MG RE SUPP: 650.0000 mg | Freq: Four times a day (QID) | RECTAL | Status: DC | PRN

## 2020-12-12 MED ORDER — AMOXICILLIN-POT CLAVULANATE 875-125 MG PO TABS: 1.0000 | ORAL_TABLET | Freq: Two times a day (BID) | ORAL | Status: DC

## 2020-12-12 MED ORDER — ACETAMINOPHEN 325 MG PO TABS
650.0000 mg | ORAL_TABLET | Freq: Four times a day (QID) | ORAL | Status: DC | PRN
  Filled 2020-12-12: qty 2

## 2020-12-12 MED ORDER — SODIUM CHLORIDE 0.9 % IV SOLN: INTRAVENOUS | Status: AC

## 2020-12-12 MED ORDER — AMOXICILLIN-POT CLAVULANATE 875-125 MG PO TABS
1.0000 | ORAL_TABLET | Freq: Two times a day (BID) | ORAL | Status: DC
  Administered 2020-12-12 – 2020-12-13 (×3): 1 via ORAL
  Filled 2020-12-12 (×3): qty 1

## 2020-12-12 NOTE — H&P (Addendum)
History and Physical    Lee Barnes [id number] DOB: Nov 20, 1950 DOA: 12/11/2020  PCP: Ronnald Nian, MD    Patient coming from: Home  I have personally briefly reviewed patient's old medical records in [facility]  Chief Complaint: from PCP office for hyponatremia.   HPI: Lee Barnes is a 70 y.o. male  with a hx of hypertension, & SCC oropharynx cancer S/p radiation & cisplatin 2011 with residual speech and swallowing difficulties worsened by history of right mandibular fracture 4 years ago who presented to the ED at advisement of PCP office for hyponatremia on labs drawn today in clinic.    Patient states he has felt poorly over the past 1 week, was seen at an ER in [place] 12/06/20 and was found to have right lower lobe on CT scan, covid negative, sodium was 128 at that time, discharged on azithromycin & augmentin. Taking abx as prescribed.   He reports that antibiotics have decreased his appetite.  He has had poor PO intake, nausea, & 1 episode of emesis past 4 days since starting antibiotic.  Therefore always return to home in [place] from his trip on Sunday, 12/10/2020 he decided to see PCP for a scheduled visit 12/11/2020.  Labs were ordered and returned with Na 116 and he was told to come to the ED.   He states he has problems with hyponatremia in the upper 120s to 131 range after taking cisplatin for his cancer previously. He had required admission for Na of 114 in the past.  He also reports history of speech and swallowing difficulties that have worsened after his mandibular fracture 4 years ago that required prosthesis.  He also has a constant postnasal drip from his allergies.  He denies fever, chills, chest pain, dyspnea, abdominal pain, seizure activity, diarrhea or hematemesis.  He currently lives with wife.  Has 3 daughters.  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  ED Course:  Blood pressure  117/70-168/74, pulse 86, RR 16, SPO2 86-99% room air T-max 98.2 -Sodium 116-->117-->121, potassium 4.5, BUN 16, creatinine 0.68 -WBC 6.8, Hb 12.9, platelets 265  -Covid PCR negative -CXR with streaky airspace opacity in the right mid and lower lobes.  May reflect residual pneumonia.  ED meds: -3 L of normal saline.  Review of Systems: As per HPI otherwise 10 point review of systems negative.     Past Medical History:  Diagnosis Date  . Allergy to environmental factors   . Complication of anesthesia    "vageled" after surgery -overnite stay  . Diverticulosis 2008  . Heart murmur    hx of in childhood   . History of chemotherapy   . History of radiation therapy 11/05/10-12/26/10   r base tongue, 7000 cGy 35 sessions  . Hypertension    Medication started in May 2016.  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 Pneumonia    hx of 2012  . 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 Tinnitus     Past Surgical History:  Procedure Laterality Date  . APPENDECTOMY    . COLONOSCOPY    . INGUINAL HERNIA REPAIR Bilateral 03/30/2015   Procedure: LAPAROSCOPIC BILATERAL INGUINAL HERNIA REPAIR WITH MESH;  Surgeon: Abigail Miyamoto, MD;  Location: [facility];  Service: General;  Laterality: Bilateral;  . INGUINAL HERNIA REPAIR Right 1960  . INGUINAL HERNIA REPAIR Bilateral 03/30/2015  . INGUINAL HERNIA REPAIR Left 07/11/2015  .  INGUINAL HERNIA REPAIR N/A 07/11/2015   Procedure: REPAIR OF LEFT INGUINAL HERNIA WITH MESH;  Surgeon: Abigail Miyamoto, MD;  [facility];  Service: General;  Laterality: N/A;  . INSERTION OF MESH N/A 07/11/2015   Procedure: INSERTION OF MESH;  Surgeon: Abigail Miyamoto, MD;  Location: [facility];  Service: General;  Laterality: N/A;  . LUMBAR DISC SURGERY    . TONSILLECTOMY       reports that he has never smoked. He has never used smokeless tobacco. He reports current alcohol use. He reports that he does not use drugs.  Allergies  Allergen Reactions  . Codeine  Nausea And Vomiting    Family History  Problem Relation Age of Onset  . Heart disease Father   . Cancer Father        throat ca  . Heart disease Mother   . Colon cancer Neg Hx      Prior to Admission medications   Medication Sig Start Date End Date Taking? Authorizing Provider  acetaminophen (TYLENOL) 500 MG tablet Take 500 mg by mouth every 5 hours   Yes [provider]  amLODipine (NORVASC) 5 MG tablet TAKE ONE TABLET BY MOUTH DAILY Patient taking differently: Take 5 mg by mouth daily. 11/28/20  Yes Ronnald Nian, MD  amoxicillin-clavulanate (AUGMENTIN) 875-125 MG tablet Take 1 tablet by mouth 2 (two) times daily.   Yes [provider]  fluticasone (FLONASE) 50 MCG/ACT nasal spray INSTILL TWO SPRAYS INTO EACH NOSTRIL DAILY AS NEEDED Patient taking differently: Place 2 sprays into both nostrils daily as needed for allergies. INSTILL TWO SPRAYS INTO EACH NOSTRIL DAILY AS NEEDED 09/05/20  Yes Ronnald Nian, MD  ondansetron (ZOFRAN) 8 MG tablet Take 1 tablet (8 mg total) by mouth every 8 (eight) hours as needed for nausea or vomiting. 03/27/18  Yes Ronnald Nian, MD  amoxicillin (AMOXIL) 250 MG/5ML suspension Take 10 mLs (500 mg total) by mouth 3 (three) times daily. Patient not taking: No sig reported 03/11/18   Mesner, Barbara Cower, MD  azithromycin (ZITHROMAX) 250 MG tablet Take 250 mg by mouth daily. 12/07/20   [provider]  Cyanocobalamin (VITAMIN B-12 PO) Take 1 tablet by mouth daily. Patient not taking: Reported on 12/11/2020    [provider]  sildenafil (VIAGRA) 100 MG tablet Take 0.5-1 tablets (50-100 mg total) by mouth daily as needed for erectile dysfunction. Patient not taking: No sig reported 03/30/19   Ronnald Nian, MD    Physical Exam: Vitals:   12/12/20 1000 12/12/20 1030 12/12/20 1230 12/12/20 1346  BP: (!) 169/103 (!) 147/95 (!) 158/89 (!) 168/74  Pulse: 80 81 83 86  Resp: 13 15 15 16   Temp:    98.2 F (36.8 C)  TempSrc:     Oral  SpO2: 97% 96% 99% 99%  Weight:      Height:        Constitutional: NAD, calm, comfortable Vitals:   12/12/20 1000 12/12/20 1030 12/12/20 1230 12/12/20 1346  BP: (!) 169/103 (!) 147/95 (!) 158/89 (!) 168/74  Pulse: 80 81 83 86  Resp: 13 15 15 16   Temp:    98.2 F (36.8 C)  TempSrc:    Oral  SpO2: 97% 96% 99% 99%  Weight:      Height:       Eyes: PERRL, lids and conjunctivae normal ENMT: Mucous membranes are moist. Posterior pharynx clear of any exudate or lesions.Normal dentition.  Neck: normal, supple, no masses, no thyromegaly Respiratory: Crackles  at right base..  Cardiovascular: Regular rate and rhythm, no murmurs / rubs / gallops. No extremity edema. 2+ pedal pulses. No carotid bruits.  Abdomen: no tenderness, no masses palpated. No hepatosplenomegaly. Bowel sounds positive.  Musculoskeletal: no clubbing / cyanosis. No joint deformity upper and lower extremities. Good ROM, no contractures. Normal muscle tone.  Skin: no rashes, lesions, ulcers. No induration Neurologic: CN 2-12 grossly intact. Sensation intact, DTR normal. Strength 5/5 in all 4.  Psychiatric: Normal judgment and insight. Alert and oriented x 3. Normal mood.    Labs on Admission: I have personally reviewed following labs and imaging studies  CBC: Recent Labs  Lab 12/11/20 1235 12/11/20 1830  WBC 6.8 6.8  NEUTROABS 5.6  --   HGB 12.9* 13.8  HCT 36.9* 38.8*  MCV 81 78.9*  PLT 265 263   Basic Metabolic Panel: Recent Labs  Lab 12/11/20 1235 12/11/20 1830 12/12/20 0635  NA 116* 117* 121*  K 4.5 4.2 3.9  CL 81* 82* 89*  CO2 26 25 24   GLUCOSE 108* 110* 87  BUN 11 11 11   CREATININE 0.68* 0.79 0.89  CALCIUM 9.3 9.5 8.7*       Component Value Date/Time   COLORURINE YELLOW 03/11/2018 1701   APPEARANCEUR HAZY (A) 03/11/2018 1701   LABSPEC 1.015 03/11/2018 1701   LABSPEC 1.015 12/13/2010 1028   PHURINE 5.0 03/11/2018 1701   GLUCOSEU NEGATIVE 03/11/2018 1701   HGBUR SMALL (A) 03/11/2018  1701   BILIRUBINUR NEGATIVE 03/11/2018 1701   BILIRUBINUR n 02/19/2016 1519   BILIRUBINUR Negative 12/13/2010 1028   KETONESUR 5 (A) 03/11/2018 1701   PROTEINUR NEGATIVE 03/11/2018 1701   UROBILINOGEN negative 02/19/2016 1519   UROBILINOGEN 0.2 04/01/2015 2316   NITRITE NEGATIVE 03/11/2018 1701   LEUKOCYTESUR NEGATIVE 03/11/2018 1701   LEUKOCYTESUR Negative 12/13/2010 1028    Radiological Exams on Admission: DG Chest 2 View  Result Date: 12/11/2020 CLINICAL DATA:  Follow-up pneumonia EXAM: CHEST - 2 VIEW COMPARISON:  03/11/2018 FINDINGS: Streaky airspace disease in the right middle and lower lobes. Diaphragm on the right appears slightly elevated. No pleural effusion. Normal heart size. No pneumothorax. IMPRESSION: Streaky airspace disease in the right middle and lower lobes, may reflect residual pneumonia. Imaging follow-up to resolution is recommended. Electronically Signed   By: Jasmine Pang M.D.   On: 12/11/2020 17:28    EKG: Independently reviewed. NSR @92  bpm, normal axis, No St T changes. Intervals WNL.    Assessment/Plan Active Problems:   Hyponatremia   Aspiration pneumonia of both lower lobes due to gastric secretions ([facility])   Dysphagia   History of oropharyngeal cancer   Allergic rhinitis   #Hyponatremia likely depletional given poor po intake and improvement with IVF -We will check urine electrolytes including urine osmolarity, urine sodium, urine creatinine.  Also check TSH and BNP. -Gentle IV hydration. No correction >12 points in 24 hrs. BMP atleast bid.  -Neurochecks.   #Right mid and lower lobe pneumonia -Continue with Augmentin. Suspect underlying aspiration from his impaired swallowing and radiation fibrosis. Speech eval in am.   #HTN -Continue with Norvasc.  DVT prophylaxis: Subcu heparin   Code Status: Full code Family Communication: None.    Disposition Plan: *Home.    Consults called: None. Admission status: Observation   Loral Campi MD [facility]   If 7PM-7AM, please contact night-coverage [email or web address] Password [facility]  12/12/2020, 2:39 PM

## 2020-12-12 NOTE — ED Notes (Addendum)
Report called to receiving nurse at Joanne Gavel RN

## 2020-12-12 NOTE — ED Notes (Signed)
Report given to Weston RN with [facility] ?

## 2020-12-13 ENCOUNTER — Telehealth: Payer: Self-pay

## 2020-12-13 DIAGNOSIS — J301 Allergic rhinitis due to pollen: Secondary | ICD-10-CM

## 2020-12-13 DIAGNOSIS — E871 Hypo-osmolality and hyponatremia: Principal | ICD-10-CM

## 2020-12-13 LAB — CBC
HCT: 34.5 % — ABNORMAL LOW (ref 39.0–52.0)
Hemoglobin: 11.7 g/dL — ABNORMAL LOW (ref 13.0–17.0)
MCH: 28.1 pg (ref 26.0–34.0)
MCHC: 33.9 g/dL (ref 30.0–36.0)
MCV: 82.9 fL (ref 80.0–100.0)
Platelets: 255 10*3/uL (ref 150–400)
RBC: 4.16 MIL/uL — ABNORMAL LOW (ref 4.22–5.81)
RDW: 13.9 % (ref 11.5–15.5)
WBC: 6.2 10*3/uL (ref 4.0–10.5)
nRBC: 0 % (ref 0.0–0.2)

## 2020-12-13 LAB — BASIC METABOLIC PANEL
Anion gap: 7 (ref 5–15)
Anion gap: 9 (ref 5–15)
BUN: 15 mg/dL (ref 8–23)
BUN: 16 mg/dL (ref 8–23)
CO2: 26 mmol/L (ref 22–32)
CO2: 25 mmol/L (ref 22–32)
Calcium: 8.9 mg/dL (ref 8.9–10.3)
Calcium: 9 mg/dL (ref 8.9–10.3)
Chloride: 94 mmol/L — ABNORMAL LOW (ref 98–111)
Chloride: 91 mmol/L — ABNORMAL LOW (ref 98–111)
Creatinine, Ser: 1.19 mg/dL (ref 0.61–1.24)
Creatinine, Ser: 0.9 mg/dL (ref 0.61–1.24)
GFR, Estimated: >60 mL/min (ref >60)
GFR, Estimated: >60 mL/min (ref >60)
Glucose, Bld: 91 mg/dL (ref 70–99)
Glucose, Bld: 94 mg/dL (ref 70–99)
Potassium: 4.4 mmol/L (ref 3.5–5.1)
Potassium: 4.2 mmol/L (ref 3.5–5.1)
Sodium: 127 mmol/L — ABNORMAL LOW (ref 135–145)
Sodium: 125 mmol/L — ABNORMAL LOW (ref 135–145)

## 2020-12-13 LAB — CREATININE, URINE, RANDOM: Creatinine, Urine: 95.22 mg/dL

## 2020-12-13 LAB — OSMOLALITY, URINE: Osmolality, Ur: 227 mOsm/kg — ABNORMAL LOW (ref 300–900)

## 2020-12-13 LAB — SODIUM, URINE, RANDOM: Sodium, Ur: 14 mmol/L

## 2020-12-13 MED ORDER — AMOXICILLIN-POT CLAVULANATE 875-125 MG PO TABS: 1.0000 | ORAL_TABLET | Freq: Two times a day (BID) | ORAL | 0 refills | Status: DC

## 2020-12-13 NOTE — Discharge Summary (Signed)
Physician Discharge Summary  Lee Barnes:[id number] DOB: 09-23-1951 DOA: 12/11/2020  PCP: Ronnald Nian, MD  Admit date: 12/11/2020 Discharge date: 12/13/2020  Admitted From: Home  disposition: Home Recommendations for Outpatient Follow-up:  1. Follow up with PCP in 1-2 weeks 2. Please obtain BMP/CBC in one week  Home Health: None  equipment/Devices none Discharge Condition stable CODE STATUS: Full code Diet recommendation: Cardiac Brief/Interim Summary:Lee Barnes is a 70 y.o. 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    Patient states he has felt poorly over the past 1 week, was seen at an ER in [place] 12/06/20 and was found to have right lower lobe on CT scan, covid negative, sodium was 128 at that time, discharged on azithromycin & augmentin. Taking abx as prescribed.   He reports that antibiotics have decreased his appetite.  He has had poor PO intake, nausea, & 1 episode of emesis past 4 days since starting antibiotic.  Therefore always return to home in [place] from his trip on Sunday, 12/10/2020 he decided to see PCP for a scheduled visit 12/11/2020.  Labs were ordered and returned with Na 116 and he was told to come to the ED.   He states he has problems with hyponatremia in the upper 120s to 131 range after taking cisplatin for his cancer previously. He had required admission for Na of 114 in the past.  He also reports history of speech and swallowing difficulties that have worsened after his mandibular fracture 4 years ago that required prosthesis.  He also has a constant postnasal drip from his allergies.  He denies fever, chills, chest pain, dyspnea, abdominal pain, seizure activity, diarrhea or  hematemesis.  He currently lives with wife.  Has 3 daughters.  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   Discharge Diagnoses:  Active Problems:   History of oropharyngeal cancer   Allergic rhinitis   Hyponatremia   Aspiration pneumonia of both lower lobes due to gastric secretions (HCC)   Dysphagia  #1 hyponatremia due to decreased p.o. intake improved with normal saline sodium 127 at the time of discharge. Patient was awake alert and anxious to go home.  Seen by speech therapy prior to discharge.  She he was thought to be a moderate aspiration risk.  Speech therapy recommended slow small sips and bites and follow solids with liquid.  Multiple dry swallows after each bite or sip.  Seated upright at 90 degrees and remain upright for at least 30 minutes after p.o. intake.  #2 right mid and right lower lobe pneumonia thought to be due to aspiration continue Augmentin for 5 days.  #3 history of essential hypertension continue Norvasc.  Estimated body mass index is 22.24 kg/m as calculated from the following:   Height as of this encounter: 6' (1.829 m).   Weight as of this encounter: 74.4 kg.  Discharge Instructions  Discharge Instructions    Diet - low sodium heart healthy   Complete by: As directed    Increase activity slowly   Complete by: As directed      Allergies as of 12/13/2020      Reactions   Codeine Nausea And Vomiting  Medication List    STOP taking these medications   amoxicillin 250 MG/5ML suspension Commonly known as: AMOXIL   azithromycin 250 MG tablet Commonly known as: ZITHROMAX   sildenafil 100 MG tablet Commonly known as: Viagra     TAKE these medications   acetaminophen 500 MG tablet Commonly known as: TYLENOL Take 500 mg by mouth every 5 hours   amLODipine 5 MG tablet Commonly known as: NORVASC TAKE ONE TABLET BY MOUTH DAILY   amoxicillin-clavulanate 875-125 MG tablet Commonly known as:  AUGMENTIN Take 1 tablet by mouth every 12 (twelve) hours. What changed: when to take this   fluticasone 50 MCG/ACT nasal spray Commonly known as: FLONASE INSTILL TWO SPRAYS INTO EACH NOSTRIL DAILY AS NEEDED What changed:   how much to take  how to take this  when to take this  reasons to take this   ondansetron 8 MG tablet Commonly known as: Zofran Take 1 tablet (8 mg total) by mouth every 8 (eight) hours as needed for nausea or vomiting.       Follow-up Information    Ronnald Nian, MD Follow up.   Specialty: Family Medicine Contact information: [address] [place] [address] [phone]              Allergies  Allergen Reactions  . Codeine Nausea And Vomiting    Consultations: None  Procedures/Studies: DG Chest 2 View  Result Date: 12/11/2020 CLINICAL DATA:  Follow-up pneumonia EXAM: CHEST - 2 VIEW COMPARISON:  03/11/2018 FINDINGS: Streaky airspace disease in the right middle and lower lobes. Diaphragm on the right appears slightly elevated. No pleural effusion. Normal heart size. No pneumothorax. IMPRESSION: Streaky airspace disease in the right middle and lower lobes, may reflect residual pneumonia. Imaging follow-up to resolution is recommended. Electronically Signed   By: Jasmine Pang M.D.   On: 12/11/2020 17:28    (Echo, Carotid, EGD, Colonoscopy, ERCP)    Subjective:  Patient sitting up by the side of the bed eating breakfast very slowly and carefully so he want cough choke or aspirate. Discharge Exam: Vitals:   12/13/20 0729 12/13/20 0830  BP: 131/84 (!) 130/96  Pulse: 70 90  Resp: 17 18  Temp: 98.1 F (36.7 C) 97.7 F (36.5 C)  SpO2: 96% 98%   Vitals:   12/12/20 2137 12/13/20 0213 12/13/20 0729 12/13/20 0830  BP: (!) 151/96 (!) 102/49 131/84 (!) 130/96  Pulse: 80 67 70 90  Resp: 17 17 17 18   Temp: 98.2 F (36.8 C) 98.2 F (36.8 C) 98.1 F (36.7 C) 97.7 F (36.5 C)  TempSrc: Oral Oral Oral Oral  SpO2: 94% 96%  96% 98%  Weight:      Height:        General: Pt is alert, awake, not in acute distress Cardiovascular: RRR, S1/S2 +, no rubs, no gallops Respiratory: Rales/rhonchi at the right base bilaterally, no wheezing, no rhonchi Abdominal: Soft, NT, ND, bowel sounds + Extremities: no edema, no cyanosis    The results of significant diagnostics from this hospitalization (including imaging, microbiology, ancillary and laboratory) are listed below for reference.     Microbiology: Recent Results (from the past 240 hour(s))  Novel Coronavirus, NAA ([facility])     Status: None   Collection Time: 12/11/20  1:37 PM   Specimen: Nasopharyngeal(NP) swabs in vial transport medium  Result Value Ref Range Status   SARS-CoV-2, NAA Not Detected Not Detected Final    Comment: This nucleic acid amplification test was developed and  its performance characteristics determined by [employer]. Nucleic acid amplification tests include RT-PCR and TMA. This test has not been FDA cleared or approved. This test has been authorized by FDA under an Emergency Use Authorization (EUA). This test is only authorized for the duration of time the declaration that circumstances exist justifying the authorization of the emergency use of in vitro diagnostic tests for detection of SARS-CoV-2 virus and/or diagnosis of COVID-19 infection under section 564(b)(1) of the Act, 21 U.S.C. 161WRU-0(A) (1), unless the authorization is terminated or revoked sooner. When diagnostic testing is negative, the possibility of a false negative result should be considered in the context of a patient's recent exposures and the presence of clinical signs and symptoms consistent with COVID-19. An individual without symptoms of COVID-19 and who is not shedding SARS-CoV-2 virus wo uld expect to have a negative (not detected) result in this assay.   SARS-COV-2, NAA 2 DAY TAT     Status: None   Collection Time: 12/11/20  1:37 PM  Result  Value Ref Range Status   SARS-CoV-2, NAA 2 DAY TAT Performed  Final  SARS Coronavirus 2 by RT PCR (hospital order, performed in [facility] hospital lab) Nasopharyngeal Nasopharyngeal Swab     Status: None   Collection Time: 12/11/20  6:30 PM   Specimen: Nasopharyngeal Swab  Result Value Ref Range Status   SARS Coronavirus 2 NEGATIVE NEGATIVE Final    Comment: (NOTE) SARS-CoV-2 target nucleic acids are NOT DETECTED.  The SARS-CoV-2 RNA is generally detectable in upper and lower respiratory specimens during the acute phase of infection. The lowest concentration of SARS-CoV-2 viral copies this assay can detect is 250 copies / mL. A negative result does not preclude SARS-CoV-2 infection and should not be used as the sole basis for treatment or other patient management decisions.  A negative result may occur with improper specimen collection / handling, submission of specimen other than nasopharyngeal swab, presence of viral mutation(s) within the areas targeted by this assay, and inadequate number of viral copies (<250 copies / mL). A negative result must be combined with clinical observations, patient history, and epidemiological information.  Fact Sheet for Patients:   BoilerBrush.com.cy  Fact Sheet for Healthcare Providers: https://pope.com/  This test is not yet approved or  cleared by the [place] FDA and has been authorized for detection and/or diagnosis of SARS-CoV-2 by FDA under an Emergency Use Authorization (EUA).  This EUA will remain in effect (meaning this test can be used) for the duration of the COVID-19 declaration under Section 564(b)(1) of the Act, 21 U.S.C. section 360bbb-3(b)(1), unless the authorization is terminated or revoked sooner.  Performed at [facility], [address] [place], [place] [address]      Labs: BNP (last 3 results) Recent Labs    12/12/20 1645  BNP 17.8   Basic  Metabolic Panel: Recent Labs  Lab 12/12/20 0635 12/12/20 1645 12/12/20 2144 12/13/20 0602 12/13/20 1036  NA 121* 124* 123* 127* 125*  K 3.9 3.7 3.9 4.4 4.2  CL 89* 88* 89* 94* 91*  CO2 24 24 26 26 25   GLUCOSE 87 89 118* 91 94  BUN 11 15 16 15 16   CREATININE 0.89 0.91 1.24 1.19 0.90  CALCIUM 8.7* 9.4 9.0 8.9 9.0   Liver Function Tests: Recent Labs  Lab 12/12/20 1645  AST 31  ALT 24  ALKPHOS 99  BILITOT 0.3  PROT 8.4*  ALBUMIN 4.5   No results for input(s): LIPASE, AMYLASE  in the last 168 hours. No results for input(s): AMMONIA in the last 168 hours. CBC: Recent Labs  Lab 12/11/20 1235 12/11/20 1830 12/13/20 0602  WBC 6.8 6.8 6.2  NEUTROABS 5.6  --   --   HGB 12.9* 13.8 11.7*  HCT 36.9* 38.8* 34.5*  MCV 81 78.9* 82.9  PLT 265 263 255   Cardiac Enzymes: No results for input(s): CKTOTAL, CKMB, CKMBINDEX, TROPONINI in the last 168 hours. BNP: Invalid input(s): POCBNP CBG: No results for input(s): GLUCAP in the last 168 hours. D-Dimer No results for input(s): DDIMER in the last 72 hours. Hgb A1c No results for input(s): HGBA1C in the last 72 hours. Lipid Profile No results for input(s): CHOL, HDL, LDLCALC, TRIG, CHOLHDL, LDLDIRECT in the last 72 hours. Thyroid function studies Recent Labs    12/12/20 1645  TSH 4.493   Anemia work up No results for input(s): VITAMINB12, FOLATE, FERRITIN, TIBC, IRON, RETICCTPCT in the last 72 hours. Urinalysis    Component Value Date/Time   COLORURINE YELLOW 12/12/2020 2000   APPEARANCEUR CLEAR 12/12/2020 2000   LABSPEC 1.008 12/12/2020 2000   LABSPEC 1.015 12/13/2010 1028   PHURINE 6.0 12/12/2020 2000   GLUCOSEU NEGATIVE 12/12/2020 2000   HGBUR SMALL (A) 12/12/2020 2000   BILIRUBINUR NEGATIVE 12/12/2020 2000   BILIRUBINUR n 02/19/2016 1519   BILIRUBINUR Negative 12/13/2010 1028   KETONESUR NEGATIVE 12/12/2020 2000   PROTEINUR NEGATIVE 12/12/2020 2000   UROBILINOGEN negative 02/19/2016 1519   UROBILINOGEN 0.2  04/01/2015 2316   NITRITE NEGATIVE 12/12/2020 2000   LEUKOCYTESUR NEGATIVE 12/12/2020 2000   LEUKOCYTESUR Negative 12/13/2010 1028   Sepsis Labs Invalid input(s): PROCALCITONIN,  WBC,  LACTICIDVEN Microbiology Recent Results (from the past 240 hour(s))  Novel Coronavirus, NAA ([facility])     Status: None   Collection Time: 12/11/20  1:37 PM   Specimen: Nasopharyngeal(NP) swabs in vial transport medium  Result Value Ref Range Status   SARS-CoV-2, NAA Not Detected Not Detected Final    Comment: This nucleic acid amplification test was developed and its performance characteristics determined by [employer]. Nucleic acid amplification tests include RT-PCR and TMA. This test has not been FDA cleared or approved. This test has been authorized by FDA under an Emergency Use Authorization (EUA). This test is only authorized for the duration of time the declaration that circumstances exist justifying the authorization of the emergency use of in vitro diagnostic tests for detection of SARS-CoV-2 virus and/or diagnosis of COVID-19 infection under section 564(b)(1) of the Act, 21 U.S.C. 166AYT-0(Z) (1), unless the authorization is terminated or revoked sooner. When diagnostic testing is negative, the possibility of a false negative result should be considered in the context of a patient's recent exposures and the presence of clinical signs and symptoms consistent with COVID-19. An individual without symptoms of COVID-19 and who is not shedding SARS-CoV-2 virus wo uld expect to have a negative (not detected) result in this assay.   SARS-COV-2, NAA 2 DAY TAT     Status: None   Collection Time: 12/11/20  1:37 PM  Result Value Ref Range Status   SARS-CoV-2, NAA 2 DAY TAT Performed  Final  SARS Coronavirus 2 by RT PCR (hospital order, performed in [facility] hospital lab) Nasopharyngeal Nasopharyngeal Swab     Status: None   Collection Time: 12/11/20  6:30 PM   Specimen: Nasopharyngeal  Swab  Result Value Ref Range Status   SARS Coronavirus 2 NEGATIVE NEGATIVE Final    Comment: (NOTE) SARS-CoV-2 target nucleic acids  are NOT DETECTED.  The SARS-CoV-2 RNA is generally detectable in upper and lower respiratory specimens during the acute phase of infection. The lowest concentration of SARS-CoV-2 viral copies this assay can detect is 250 copies / mL. A negative result does not preclude SARS-CoV-2 infection and should not be used as the sole basis for treatment or other patient management decisions.  A negative result may occur with improper specimen collection / handling, submission of specimen other than nasopharyngeal swab, presence of viral mutation(s) within the areas targeted by this assay, and inadequate number of viral copies (<250 copies / mL). A negative result must be combined with clinical observations, patient history, and epidemiological information.  Fact Sheet for Patients:   BoilerBrush.com.cy  Fact Sheet for Healthcare Providers: https://pope.com/  This test is not yet approved or  cleared by the [place] FDA and has been authorized for detection and/or diagnosis of SARS-CoV-2 by FDA under an Emergency Use Authorization (EUA).  This EUA will remain in effect (meaning this test can be used) for the duration of the COVID-19 declaration under Section 564(b)(1) of the Act, 21 U.S.C. section 360bbb-3(b)(1), unless the authorization is terminated or revoked sooner.  Performed at [facility], [address], [place], [place] [address]      Time coordinating discharge:  38 minutes  SIGNED:   Alwyn Ren, MD  [facility] 12/13/2020, 4:35 PM

## 2020-12-13 NOTE — Evaluation (Signed)
Clinical/Bedside Swallow Evaluation Patient Details  Name: Lee Barnes MRN: [id number] Date of Birth: 05/01/1951  Today's Date: 12/13/2020 Time: SLP Start Time (ACUTE ONLY): 0750 SLP Stop Time (ACUTE ONLY): 0819 SLP Time Calculation (min) (ACUTE ONLY): 29 min  Past Medical History:  Past Medical History:  Diagnosis Date  . Allergy to environmental factors   . Complication of anesthesia    "vageled" after surgery -overnite stay  . Diverticulosis 2008  . Heart murmur    hx of in childhood   . History of chemotherapy   . History of radiation therapy 11/05/10-12/26/10   r base tongue, 7000 cGy 35 sessions  . Hypertension    Medication started in May 2016.  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 Pneumonia    hx of 2012  . 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 Tinnitus    Past Surgical History:  Past Surgical History:  Procedure Laterality Date  . APPENDECTOMY    . COLONOSCOPY    . INGUINAL HERNIA REPAIR Bilateral 03/30/2015   Procedure: LAPAROSCOPIC BILATERAL INGUINAL HERNIA REPAIR WITH MESH;  Surgeon: Abigail Miyamoto, MD;  Location: [facility] ORS;  Service: General;  Laterality: Bilateral;  . INGUINAL HERNIA REPAIR Right 1960  . INGUINAL HERNIA REPAIR Bilateral 03/30/2015  . INGUINAL HERNIA REPAIR Left 07/11/2015  . INGUINAL HERNIA REPAIR N/A 07/11/2015   Procedure: REPAIR OF LEFT INGUINAL HERNIA WITH MESH;  Surgeon: Abigail Miyamoto, MD;  Location: [facility] OR;  Service: General;  Laterality: N/A;  . INSERTION OF MESH N/A 07/11/2015   Procedure: INSERTION OF MESH;  Surgeon: Abigail Miyamoto, MD;  Location: [facility] OR;  Service: General;  Laterality: N/A;  . LUMBAR DISC SURGERY    . TONSILLECTOMY     HPI:  pt is a 70 yo male with h/o head and neck cancer diagnosed in 2011 and treated with chemoradiation concurrently into May 2012.   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  Pt now admitted with decreased po intake, hyponatremia and  found to have right lobe pna.  Swallow evaluation ordered.  Prior MBS conducted showing radiation and surgical sourced dysphagia with retention, trace silent aspiration and strategies were provided to mitigate risk.  Pt reports since this mandibular surgery he has had vocal hoarseness and some increasing dysphagia but that he is close to his baseline.  He had required a PEG during cancer treatments but this has since been removed.  Pt denies recurrent pneumonias, requiring heimlich manuever nor weight loss.  Pt reports sensation of food sticking in his throat at times and also nasal regurgitation of liquids *approx once a month.  He admits that he sensed pill lodging in throat last night and thus forced himself to bring it back up.  States sometimes he will clear his throat after he is finished eating and food will come back up- which was occuring in September 2021.   Assessment / Plan / Recommendation Clinical Impression  Patient presents with chronic dysphagia due to surgical and radiation treatment for his SCCA diagnosed in 2011.  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 palatal surgery.  Chronic dysphagia present for which pt has managed without aspiration pnas, weight loss, etc until this admit.  Pt denies having significant aspiration episode just prior to illness. Admits appetite and intake poor over the last four days as he was feeling poorly.  Baseline  voice hoarseness with frequent throat clearing noted. Pt admits secretions are worse during the am when waking.  He is also clearing his throat and intermittently coughing with intake - with liquids more than foods.  Reviewed prior MBS with pt and provided it to him in writing, advising to revisit swallow precautins stricly at least while ill.  Reviewed option of repeating MBS with this hospital stay or clinically managing.  Pt also admits to conducting swallowing  exercises "some"- but advised he pursue them on more regular basis to decrease tissue fibrosis from XRT.  Currently recommend large pills be crushed or cut in half and given with applesauce/pudding - start and follow with liquids.  SLP will follow up for dysphagia management = and re-introduction of swallowing exercises.  Pt agreeable to plan. SLP Visit Diagnosis: Dysphagia, oropharyngeal phase (R13.12)    Aspiration Risk  Moderate aspiration risk    Diet Recommendation Regular (to allow pt to choose food items)   Liquid Administration via: Cup;Straw Medication Administration: Crushed with puree Supervision: Patient able to self feed Compensations: Slow rate;Small sips/bites;Follow solids with liquid;Multiple dry swallows after each bite/sip;Other (Comment) (intermittent cough/hock to clear pharynx of retention not sensed) Postural Changes: Seated upright at 90 degrees;Remain upright for at least 30 minutes after po intake    Other  Recommendations Oral Care Recommendations: Oral care QID   Follow up Recommendations        Frequency and Duration min 1 x/week  1 week       Prognosis Prognosis for Safe Diet Advancement: Fair Barriers to Reach Goals: Time post onset      Swallow Study   General Date of Onset: 08/11/20 HPI: pt is a 70 yo male with h/o head and neck cancer diagnosed in 2011 and treated with chemoradiation concurrently into May 2012.   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  Pt now admitted with decreased po intake, hyponatremia and found to have right lobe pna.  Swallow evaluation ordered.  Prior MBS conducted showing radiation and surgical sourced dysphagia with retention, trace silent aspiration and strategies were provided to mitigate risk.  Pt reports since this mandibular surgery he has had vocal hoarseness and some increasing dysphagia but that he is close to his baseline.  He had required a PEG during cancer  treatments but this has since been removed.  Pt denies recurrent pneumonias, requiring heimlich manuever nor weight loss.  Pt reports sensation of food sticking in his throat at times and also nasal regurgitation of liquids *approx once a month.  He admits that he sensed pill lodging in throat last night and thus forced himself to bring it back up.  States sometimes he will clear his throat after he is finished eating and food will come back up- which was occuring in September 2021. Type of Study: Bedside Swallow Evaluation Previous Swallow Assessment: mbs 07/2020 Diet Prior to this Study: Regular;Thin liquids Temperature Spikes Noted: No Respiratory Status: Room air History of Recent Intubation: No Behavior/Cognition: Alert;Cooperative;Pleasant mood Oral Cavity Assessment: Other (comment);Dry Oral Care Completed by SLP: No Oral Cavity - Dentition: Other (Comment) (present) Vision: Functional for self-feeding Self-Feeding Abilities: Able to feed self Patient Positioning: Upright in bed Baseline Vocal Quality: Low vocal intensity Volitional Cough: Strong Volitional Swallow: Able to elicit    Oral/Motor/Sensory Function Overall Oral Motor/Sensory Function: Other (comment) (obvious surgical changes, lingual deviation to right, right mandibular sx, etc)   Ice Chips Ice chips: Not tested  Thin Liquid Thin Liquid: Impaired Presentation: Cup;Straw;Self Fed Oral Phase Impairments: Reduced labial seal Pharyngeal  Phase Impairments: Throat Clearing - Immediate;Cough - Delayed Other Comments: placing straw on left side improves labial seal    Nectar Thick Nectar Thick Liquid: Not tested   Honey Thick Honey Thick Liquid: Not tested   Puree Puree: Not tested   Solid     Solid: Within functional limits Presentation: Self Fed      Chales Abrahams 12/13/2020,10:20 AM  Rolena Infante, MS [facility] SLP Acute Rehab Services Office [phone] Pager [phone]

## 2020-12-13 NOTE — Telephone Encounter (Signed)
Pt wife advised that pt is depressed and will maybe need medicine. Pt wife was very concerned about it and is afraid that pt will not open up. [place]

## 2020-12-14 ENCOUNTER — Telehealth: Payer: Self-pay | Admitting: Family Medicine

## 2020-12-14 LAB — UREA NITROGEN, URINE: Urea Nitrogen, Ur: 324 mg/dL

## 2020-12-14 NOTE — Telephone Encounter (Signed)
Wife called and scheduled appt for husband on Monday 1/31.  Pt has been released from hospital for Aspiration Pneumonia. Wife states husband is depressed and she wants to talk with you about that before the visit.  How do you want this to happen?  Wife phone [phone]

## 2020-12-18 ENCOUNTER — Encounter: Payer: Self-pay | Admitting: Family Medicine

## 2020-12-18 ENCOUNTER — Ambulatory Visit (INDEPENDENT_AMBULATORY_CARE_PROVIDER_SITE_OTHER): Payer: Medicare Other | Admitting: Family Medicine

## 2020-12-18 ENCOUNTER — Other Ambulatory Visit: Payer: Self-pay

## 2020-12-18 VITALS — BP 186/96 | HR 94 | Temp 97.9°F | Wt 167.6 lb

## 2020-12-18 DIAGNOSIS — G903 Multi-system degeneration of the autonomic nervous system: Secondary | ICD-10-CM | POA: Diagnosis not present

## 2020-12-18 DIAGNOSIS — Z85819 Personal history of malignant neoplasm of unspecified site of lip, oral cavity, and pharynx: Secondary | ICD-10-CM | POA: Diagnosis not present

## 2020-12-18 DIAGNOSIS — J690 Pneumonitis due to inhalation of food and vomit: Secondary | ICD-10-CM | POA: Diagnosis not present

## 2020-12-18 DIAGNOSIS — E871 Hypo-osmolality and hyponatremia: Secondary | ICD-10-CM

## 2020-12-18 DIAGNOSIS — I10 Essential (primary) hypertension: Secondary | ICD-10-CM | POA: Diagnosis not present

## 2020-12-18 NOTE — Addendum Note (Signed)
Addended by: Elyse Jarvis on: 12/18/2020 03:50 PM   Modules accepted: Orders

## 2020-12-18 NOTE — Addendum Note (Signed)
Addended by: Elyse Jarvis on: 12/18/2020 03:30 PM   Modules accepted: Orders

## 2020-12-18 NOTE — Progress Notes (Signed)
Subjective:    Patient ID: Lee Barnes, male    DOB: May 21, 1951, 70 y.o.   MRN: [id number]  HPI He is here for follow-up visit after recent hospitalization for evaluation of treatment of hyponatremia.  He was also found to have evidence of aspiration pneumonia and did have speech pathology involved.  Apparently there is still for the evaluation and work that needs to be done by them.  He also has a history of hypertension but has not difficulty in the past with orthostatic hypotension.  Presently he is taking amlodipine.  He does see Dr. Leticia Clas in [place] that is apparently helping with that.  He has had a tilt table test done. I also discussed the fact that he has had multiple surgeries and procedures done over him over the last several years.  He seems to have a very good attitude towards this and treating it very matter-of-fact.  He feels that he is not stressed or depressed about it.   Review of Systems     Objective:   Physical Exam Alert and in no distress otherwise not examined       Assessment & Plan:  History of oropharyngeal cancer - Plan: CBC with Differential/Platelet, Comprehensive metabolic panel  Essential hypertension - Plan: CBC with Differential/Platelet, Comprehensive metabolic panel  Neurogenic orthostatic hypotension (HCC)  Hyponatremia - Plan: Comprehensive metabolic panel  Aspiration pneumonia of right lower lobe due to gastric secretions (HCC) - Plan: SLP plan of care cert/re-cert  We will determine whether speech pathology needs to do any more work with him will need to get home health involved with this.  He is also to go back to see Dr. Leticia Clas to discuss further care in regard to his blood pressure, sodiums and potassiums.  Might need to get nutrition involved with all this.  35 minutes spent in counseling and coordination of care.

## 2020-12-19 DIAGNOSIS — M4712 Other spondylosis with myelopathy, cervical region: Secondary | ICD-10-CM | POA: Diagnosis not present

## 2020-12-19 LAB — COMPREHENSIVE METABOLIC PANEL
ALT: 16 IU/L (ref 0–44)
AST: 16 IU/L (ref 0–40)
Albumin/Globulin Ratio: 1.5 (ref 1.2–2.2)
Albumin: 4.3 g/dL (ref 3.8–4.8)
Alkaline Phosphatase: 106 IU/L (ref 44–121)
BUN/Creatinine Ratio: 15 (ref 10–24)
BUN: 17 mg/dL (ref 8–27)
Bilirubin Total: 0.2 mg/dL (ref 0.0–1.2)
CO2: 24 mmol/L (ref 20–29)
Calcium: 9.5 mg/dL (ref 8.6–10.2)
Chloride: 95 mmol/L — ABNORMAL LOW (ref 96–106)
Creatinine, Ser: 1.17 mg/dL (ref 0.76–1.27)
GFR calc Af Amer: 73 mL/min/{1.73_m2} (ref >59)
GFR calc non Af Amer: 63 mL/min/{1.73_m2} (ref >59)
Globulin, Total: 2.9 g/dL (ref 1.5–4.5)
Glucose: 96 mg/dL (ref 65–99)
Potassium: 4.4 mmol/L (ref 3.5–5.2)
Sodium: 132 mmol/L — ABNORMAL LOW (ref 134–144)
Total Protein: 7.2 g/dL (ref 6.0–8.5)

## 2020-12-19 LAB — CBC WITH DIFFERENTIAL/PLATELET
Basophils Absolute: 0 10*3/uL (ref 0.0–0.2)
Basos: 1 %
EOS (ABSOLUTE): 0.2 10*3/uL (ref 0.0–0.4)
Eos: 5 %
Hematocrit: 35.6 % — ABNORMAL LOW (ref 37.5–51.0)
Hemoglobin: 12.2 g/dL — ABNORMAL LOW (ref 13.0–17.7)
Immature Grans (Abs): 0 10*3/uL (ref 0.0–0.1)
Immature Granulocytes: 0 %
Lymphocytes Absolute: 0.8 10*3/uL (ref 0.7–3.1)
Lymphs: 16 %
MCH: 27.9 pg (ref 26.6–33.0)
MCHC: 34.3 g/dL (ref 31.5–35.7)
MCV: 82 fL (ref 79–97)
Monocytes Absolute: 0.5 10*3/uL (ref 0.1–0.9)
Monocytes: 10 %
Neutrophils Absolute: 3.4 10*3/uL (ref 1.4–7.0)
Neutrophils: 68 %
Platelets: 321 10*3/uL (ref 150–450)
RBC: 4.37 x10E6/uL (ref 4.14–5.80)
RDW: 13.7 % (ref 11.6–15.4)
WBC: 5 10*3/uL (ref 3.4–10.8)

## 2020-12-21 DIAGNOSIS — M4712 Other spondylosis with myelopathy, cervical region: Secondary | ICD-10-CM | POA: Diagnosis not present

## 2020-12-26 DIAGNOSIS — M4712 Other spondylosis with myelopathy, cervical region: Secondary | ICD-10-CM | POA: Diagnosis not present

## 2020-12-28 DIAGNOSIS — M5412 Radiculopathy, cervical region: Secondary | ICD-10-CM | POA: Diagnosis not present

## 2020-12-28 DIAGNOSIS — M5022 Other cervical disc displacement, mid-cervical region, unspecified level: Secondary | ICD-10-CM | POA: Diagnosis not present

## 2021-01-02 DIAGNOSIS — M4712 Other spondylosis with myelopathy, cervical region: Secondary | ICD-10-CM | POA: Diagnosis not present

## 2021-01-04 DIAGNOSIS — M4712 Other spondylosis with myelopathy, cervical region: Secondary | ICD-10-CM | POA: Diagnosis not present

## 2021-01-09 DIAGNOSIS — M4712 Other spondylosis with myelopathy, cervical region: Secondary | ICD-10-CM | POA: Diagnosis not present

## 2021-01-11 DIAGNOSIS — M4712 Other spondylosis with myelopathy, cervical region: Secondary | ICD-10-CM | POA: Diagnosis not present

## 2021-01-16 DIAGNOSIS — M4712 Other spondylosis with myelopathy, cervical region: Secondary | ICD-10-CM | POA: Diagnosis not present

## 2021-01-18 DIAGNOSIS — M4712 Other spondylosis with myelopathy, cervical region: Secondary | ICD-10-CM | POA: Diagnosis not present

## 2021-01-30 DIAGNOSIS — M4712 Other spondylosis with myelopathy, cervical region: Secondary | ICD-10-CM | POA: Diagnosis not present

## 2021-02-01 DIAGNOSIS — M4712 Other spondylosis with myelopathy, cervical region: Secondary | ICD-10-CM | POA: Diagnosis not present

## 2021-02-06 DIAGNOSIS — M4712 Other spondylosis with myelopathy, cervical region: Secondary | ICD-10-CM | POA: Diagnosis not present

## 2021-02-20 ENCOUNTER — Emergency Department (HOSPITAL_COMMUNITY): Payer: Medicare Other

## 2021-02-20 ENCOUNTER — Telehealth: Payer: Self-pay

## 2021-02-20 ENCOUNTER — Observation Stay (HOSPITAL_COMMUNITY)
Admission: EM | Admit: 2021-02-20 | Discharge: 2021-02-21 | Disposition: A | Discharge status: Home / Self Care | Payer: Medicare Other | Attending: Internal Medicine | Admitting: Internal Medicine

## 2021-02-20 ENCOUNTER — Encounter (HOSPITAL_COMMUNITY): Payer: Self-pay | Admitting: Emergency Medicine

## 2021-02-20 ENCOUNTER — Other Ambulatory Visit: Payer: Self-pay

## 2021-02-20 DIAGNOSIS — Z85818 Personal history of malignant neoplasm of other sites of lip, oral cavity, and pharynx: Secondary | ICD-10-CM | POA: Diagnosis not present

## 2021-02-20 DIAGNOSIS — J690 Pneumonitis due to inhalation of food and vomit: Principal | ICD-10-CM | POA: Diagnosis present

## 2021-02-20 DIAGNOSIS — I10 Essential (primary) hypertension: Secondary | ICD-10-CM | POA: Diagnosis present

## 2021-02-20 DIAGNOSIS — R1312 Dysphagia, oropharyngeal phase: Secondary | ICD-10-CM | POA: Diagnosis not present

## 2021-02-20 DIAGNOSIS — Z20822 Contact with and (suspected) exposure to covid-19: Secondary | ICD-10-CM | POA: Diagnosis not present

## 2021-02-20 DIAGNOSIS — Z79899 Other long term (current) drug therapy: Secondary | ICD-10-CM | POA: Diagnosis not present

## 2021-02-20 DIAGNOSIS — E871 Hypo-osmolality and hyponatremia: Secondary | ICD-10-CM

## 2021-02-20 DIAGNOSIS — A419 Sepsis, unspecified organism: Secondary | ICD-10-CM

## 2021-02-20 DIAGNOSIS — R509 Fever, unspecified: Secondary | ICD-10-CM | POA: Diagnosis not present

## 2021-02-20 DIAGNOSIS — R6510 Systemic inflammatory response syndrome (SIRS) of non-infectious origin without acute organ dysfunction: Secondary | ICD-10-CM | POA: Insufficient documentation

## 2021-02-20 DIAGNOSIS — J189 Pneumonia, unspecified organism: Secondary | ICD-10-CM | POA: Diagnosis not present

## 2021-02-20 LAB — CBC WITH DIFFERENTIAL/PLATELET
Abs Immature Granulocytes: 0.04 10*3/uL (ref 0.00–0.07)
Basophils Absolute: 0 10*3/uL (ref 0.0–0.1)
Basophils Relative: 0 %
Eosinophils Absolute: 0.1 10*3/uL (ref 0.0–0.5)
Eosinophils Relative: 1 %
HCT: 36.4 % — ABNORMAL LOW (ref 39.0–52.0)
Hemoglobin: 12.3 g/dL — ABNORMAL LOW (ref 13.0–17.0)
Immature Granulocytes: 0 %
Lymphocytes Relative: 3 %
Lymphs Abs: 0.3 10*3/uL — ABNORMAL LOW (ref 0.7–4.0)
MCH: 28.2 pg (ref 26.0–34.0)
MCHC: 33.8 g/dL (ref 30.0–36.0)
MCV: 83.5 fL (ref 80.0–100.0)
Monocytes Absolute: 0.6 10*3/uL (ref 0.1–1.0)
Monocytes Relative: 7 %
Neutro Abs: 8.4 10*3/uL — ABNORMAL HIGH (ref 1.7–7.7)
Neutrophils Relative %: 89 %
Platelets: 254 10*3/uL (ref 150–400)
RBC: 4.36 MIL/uL (ref 4.22–5.81)
RDW: 13.8 % (ref 11.5–15.5)
WBC: 9.4 10*3/uL (ref 4.0–10.5)
nRBC: 0 % (ref 0.0–0.2)

## 2021-02-20 LAB — COMPREHENSIVE METABOLIC PANEL
ALT: 12 U/L (ref 0–44)
AST: 16 U/L (ref 15–41)
Albumin: 4 g/dL (ref 3.5–5.0)
Alkaline Phosphatase: 88 U/L (ref 38–126)
Anion gap: 10 (ref 5–15)
BUN: 15 mg/dL (ref 8–23)
CO2: 23 mmol/L (ref 22–32)
Calcium: 9.1 mg/dL (ref 8.9–10.3)
Chloride: 95 mmol/L — ABNORMAL LOW (ref 98–111)
Creatinine, Ser: 1.05 mg/dL (ref 0.61–1.24)
GFR, Estimated: >60 mL/min (ref >60)
Glucose, Bld: 128 mg/dL — ABNORMAL HIGH (ref 70–99)
Potassium: 4.3 mmol/L (ref 3.5–5.1)
Sodium: 128 mmol/L — ABNORMAL LOW (ref 135–145)
Total Bilirubin: 0.7 mg/dL (ref 0.3–1.2)
Total Protein: 7.9 g/dL (ref 6.5–8.1)

## 2021-02-20 LAB — PROTIME-INR
INR: 1 (ref 0.8–1.2)
Prothrombin Time: 13.1 seconds (ref 11.4–15.2)

## 2021-02-20 LAB — URINALYSIS, ROUTINE W REFLEX MICROSCOPIC
Bacteria, UA: NONE SEEN
Bilirubin Urine: NEGATIVE
Glucose, UA: NEGATIVE mg/dL
Ketones, ur: NEGATIVE mg/dL
Leukocytes,Ua: NEGATIVE
Nitrite: NEGATIVE
Protein, ur: NEGATIVE mg/dL
Specific Gravity, Urine: 1.009 (ref 1.005–1.030)
pH: 8 (ref 5.0–8.0)

## 2021-02-20 LAB — RESP PANEL BY RT-PCR (FLU A&B, COVID) ARPGX2
Influenza A by PCR: NEGATIVE
Influenza B by PCR: NEGATIVE
SARS Coronavirus 2 by RT PCR: NEGATIVE

## 2021-02-20 LAB — LACTIC ACID, PLASMA: Lactic Acid, Venous: 1 mmol/L (ref 0.5–1.9)

## 2021-02-20 MED ORDER — SODIUM CHLORIDE 0.9 % IV BOLUS
1000.0000 mL | Freq: Once | INTRAVENOUS | Status: AC
  Administered 2021-02-20: 1000 mL via INTRAVENOUS

## 2021-02-20 MED ORDER — SODIUM CHLORIDE 0.9 % IV SOLN
3.0000 g | Freq: Four times a day (QID) | INTRAVENOUS | Status: DC
  Administered 2021-02-21 (×3): 3 g via INTRAVENOUS
  Filled 2021-02-20 (×2): qty 8
  Filled 2021-02-20 (×2): qty 3

## 2021-02-20 MED ORDER — ACETAMINOPHEN 325 MG PO TABS: 650.0000 mg | ORAL_TABLET | Freq: Four times a day (QID) | ORAL | Status: DC | PRN

## 2021-02-20 MED ORDER — SODIUM CHLORIDE 0.9 % IV SOLN
1.5000 g | Freq: Once | INTRAVENOUS | Status: AC
  Administered 2021-02-20: 1.5 g via INTRAVENOUS
  Filled 2021-02-20: qty 1.5

## 2021-02-20 MED ORDER — ACETAMINOPHEN 650 MG RE SUPP: 650.0000 mg | Freq: Four times a day (QID) | RECTAL | Status: DC | PRN

## 2021-02-20 MED ORDER — SODIUM CHLORIDE 0.9 % IV SOLN: INTRAVENOUS | Status: AC

## 2021-02-20 MED ORDER — ENOXAPARIN SODIUM 40 MG/0.4ML ~~LOC~~ SOLN
40.0000 mg | SUBCUTANEOUS | Status: DC
  Administered 2021-02-20: 40 mg via SUBCUTANEOUS
  Filled 2021-02-20: qty 0.4

## 2021-02-20 NOTE — Telephone Encounter (Signed)
Pt wife advised . [facility] 

## 2021-02-20 NOTE — Telephone Encounter (Signed)
Pt wife called and advised pt is not able to eat or get warm. She advised the  Last time this happened he had pneumonia and his sodium was very low. I have put them on the schedule for tomorrow. Please advise if he should go to hospital . [facility]

## 2021-02-20 NOTE — Progress Notes (Signed)
Pharmacy Antibiotic Note  Lee Barnes is a 70 y.o. male admitted on 02/20/2021 with SOB and fever. Pt has had surgery to the base of his tongue, so has trouble swallowing Pharmacy has been consulted for dose unasyn for aspiration pna.  Plan: unasyn 3gm IV q6h Follow renal function and clinical course     Temp (24hrs), Avg:98.9 F (37.2 C), Min:98.8 F (37.1 C), Max:99 F (37.2 C)  Recent Labs  Lab 02/20/21 1746  WBC 9.4  CREATININE 1.05  LATICACIDVEN 1.0    CrCl cannot be calculated (Unknown ideal weight.).    Allergies  Allergen Reactions  . Codeine Nausea And Vomiting     Thank you for allowing pharmacy to be a part of this patient's care.  Dolly Rias RPh 02/20/2021, 10:02 PM

## 2021-02-20 NOTE — ED Provider Notes (Signed)
[facility] DEPT Provider Note   CSN: [id number] Arrival date & time: 02/20/21  1734     History Chief Complaint  Patient presents with  . Fever    Lee Barnes is a 70 y.o. male.  Pt presents to the ED today with sob and fever.  Pt said he developed fever and chills suddenly this afternoon around 1430.  He took tylenol around 1700.  He has had surgery to the base of his tongue, so has trouble swallowing.  He has had aspiration pneumonia in the past and was last admitted in January for this problem.        Past Medical History:  Diagnosis Date  . Allergy to environmental factors   . Complication of anesthesia    "vageled" after surgery -overnite stay  . Diverticulosis 2008  . Heart murmur    hx of in childhood   . History of chemotherapy   . History of radiation therapy 11/05/10-12/26/10   r base tongue, 7000 cGy 35 sessions  . Hypertension    Medication started in May 2016.  Marland Kitchen Oropharynx cancer ([place]) 09/2010  . Pneumonia    hx of 2012  . 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 Tinnitus     Patient Active Problem List   Diagnosis Date Noted  . Aspiration pneumonia ([place]) 02/20/2021  . Aspiration pneumonia of both lower lobes due to gastric secretions ([place]) 12/12/2020  . Dysphagia 12/12/2020  . Hyponatremia 12/11/2020  . Neurogenic orthostatic hypotension ([place]) 03/30/2019  . 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 ACE-inhibitor cough 05/25/2015  . Essential hypertension 03/21/2015  . Allergic rhinitis 12/14/2013  . ED (erectile dysfunction) 12/14/2013  . Diverticulosis   . History of oropharyngeal cancer 09/18/2010    Past Surgical History:  Procedure Laterality Date  . APPENDECTOMY    . COLONOSCOPY    . INGUINAL HERNIA REPAIR Bilateral 03/30/2015   Procedure: LAPAROSCOPIC BILATERAL INGUINAL HERNIA REPAIR WITH MESH;  Surgeon: Coralie Keens, MD;  Location: [facility];  Service: General;  Laterality: Bilateral;  .  INGUINAL HERNIA REPAIR Right 1960  . INGUINAL HERNIA REPAIR Bilateral 03/30/2015  . INGUINAL HERNIA REPAIR Left 07/11/2015  . INGUINAL HERNIA REPAIR N/A 07/11/2015   Procedure: REPAIR OF LEFT INGUINAL HERNIA WITH MESH;  Surgeon: Coralie Keens, MD;  Location: [facility];  Service: General;  Laterality: N/A;  . INSERTION OF MESH N/A 07/11/2015   Procedure: INSERTION OF MESH;  Surgeon: Coralie Keens, MD;  Location: [facility];  Service: General;  Laterality: N/A;  . LUMBAR [place]    . TONSILLECTOMY         Family History  Problem Relation Age of Onset  . Heart disease Father   . Cancer Father        throat ca  . Heart disease Mother   . Colon cancer Neg Hx     Social History   Tobacco Use  . Smoking status: Never Smoker  . Smokeless tobacco: Never Used  Substance Use Topics  . Alcohol use: Yes    Comment: occas drinks a beer every few weeks   . Drug use: No    Comment:      Home Medications Prior to Admission medications   Medication Sig Start Date End Date Taking? Authorizing Provider  acetaminophen (TYLENOL) 500 MG tablet Take 500 mg by mouth every 6 (six) hours as needed for mild pain or moderate pain.   Yes [provider]  amLODipine (NORVASC) 5 MG tablet TAKE ONE TABLET BY MOUTH DAILY Patient taking differently: Take 5 mg by mouth every evening. 11/28/20  Yes Denita Lung, MD  cetirizine (ZYRTEC) 10 MG chewable tablet Chew 10 mg by mouth at bedtime.   Yes [provider]  fluticasone (FLONASE) 50 MCG/ACT nasal spray INSTILL TWO SPRAYS INTO EACH NOSTRIL DAILY AS NEEDED Patient taking differently: Place 2 sprays into both nostrils daily as needed for allergies. INSTILL TWO SPRAYS INTO EACH NOSTRIL DAILY AS NEEDED 09/05/20  Yes Denita Lung, MD  amoxicillin-clavulanate (AUGMENTIN) 875-125 MG tablet Take 1 tablet by mouth every 12 (twelve) hours. Patient not taking: No sig reported 12/13/20   Georgette Shell, MD  ondansetron (ZOFRAN) 8 MG tablet  Take 1 tablet (8 mg total) by mouth every 8 (eight) hours as needed for nausea or vomiting. Patient not taking: No sig reported 03/27/18   Denita Lung, MD    Allergies    Codeine  Review of Systems   Review of Systems  Constitutional: Positive for chills and fever.  Respiratory: Positive for shortness of breath.   All other systems reviewed and are negative.   Physical Exam Updated Vital Signs BP 129/84   Pulse 81   Temp 98.8 F (37.1 C) (Oral)   Resp 16   SpO2 93%   Physical Exam Vitals and nursing note reviewed.  Constitutional:      Appearance: Normal appearance.  HENT:     Head: Normocephalic and atraumatic.     Right Ear: External ear normal.     Left Ear: External ear normal.     Nose: Nose normal.     Mouth/Throat:     Mouth: Mucous membranes are dry.  Eyes:     Extraocular Movements: Extraocular movements intact.     Conjunctiva/sclera: Conjunctivae normal.     Pupils: Pupils are equal, round, and reactive to light.  Cardiovascular:     Rate and Rhythm: Tachycardia present.     Pulses: Normal pulses.     Heart sounds: Normal heart sounds.  Pulmonary:     Effort: Pulmonary effort is normal.     Breath sounds: Normal breath sounds.  Abdominal:     General: Abdomen is flat. Bowel sounds are normal.     Palpations: Abdomen is soft.  Musculoskeletal:        General: Normal range of motion.     Cervical back: Normal range of motion.  Skin:    General: Skin is warm.     Capillary Refill: Capillary refill takes less than 2 seconds.  Neurological:     General: No focal deficit present.     Mental Status: He is alert and oriented to person, place, and time.  Psychiatric:        Mood and Affect: Mood normal.        Behavior: Behavior normal.        Thought Content: Thought content normal.        Judgment: Judgment normal.     ED Results / Procedures / Treatments   Labs (all labs ordered are listed, but only abnormal results are displayed) Labs  Reviewed  COMPREHENSIVE METABOLIC PANEL - Abnormal; Notable for the following components:      Result Value   Sodium 128 (*)    Chloride 95 (*)    Glucose, Bld 128 (*)    All other components within normal limits  CBC WITH DIFFERENTIAL/PLATELET - Abnormal; Notable for the following components:  Hemoglobin 12.3 (*)    HCT 36.4 (*)    Neutro Abs 8.4 (*)    Lymphs Abs 0.3 (*)    All other components within normal limits  URINALYSIS, ROUTINE W REFLEX MICROSCOPIC - Abnormal; Notable for the following components:   Color, Urine STRAW (*)    Hgb urine dipstick SMALL (*)    All other components within normal limits  RESP PANEL BY RT-PCR (FLU A&B, COVID) ARPGX2  CULTURE, BLOOD (ROUTINE X 2)  CULTURE, BLOOD (ROUTINE X 2)  URINE CULTURE  LACTIC ACID, PLASMA  PROTIME-INR    EKG None  Radiology DG Chest Portable 1 View  Result Date: 02/20/2021 CLINICAL DATA:  Fever. EXAM: PORTABLE CHEST 1 VIEW COMPARISON:  December 11, 2020. FINDINGS: The heart size and mediastinal contours are within normal limits. No pneumothorax or pleural effusion is noted. Mild right basilar subsegmental atelectasis or infiltrate is noted. Slightly increased left basilar opacity is noted concerning for developing infiltrate. The visualized skeletal structures are unremarkable. IMPRESSION: Mild right basilar subsegmental atelectasis or infiltrate. Slightly increased left basilar opacity is noted concerning for developing infiltrate. Electronically Signed   By: Marijo Conception M.D.   On: 02/20/2021 18:17    Procedures Procedures   Medications Ordered in ED Medications  sodium chloride 0.9 % bolus 1,000 mL (0 mLs Intravenous Stopped 02/20/21 1953)  ampicillin-sulbactam (UNASYN) 1.5 g in sodium chloride 0.9 % 100 mL IVPB (0 g Intravenous Stopped 02/20/21 2033)    ED Course  I have reviewed the triage vital signs and the nursing notes.  Pertinent labs & imaging results that were available during my care of the patient  were reviewed by me and considered in my medical decision making (see chart for details).    MDM Rules/Calculators/A&P                         Pt with bilateral pneumonia.  Possible aspiration from cherry pie yesterday.  Pt started on unasyn IV.  He presented as possibly septic on arrival with tachycardia, fever, and tachypnea.  Sx have improved with fluids.  Pt d/w Dr. Marlowe Sax ([facility]) for admission.  Covid neg.  Lee Barnes was evaluated in Emergency Department on 02/20/2021 for the symptoms described in the history of present illness. He was evaluated in the context of the global COVID-19 pandemic, which necessitated consideration that the patient might be at risk for infection with the SARS-CoV-2 virus that causes COVID-19. Institutional protocols and algorithms that pertain to the evaluation of patients at risk for COVID-19 are in a state of rapid change based on information released by regulatory bodies including the CDC and federal and state organizations. These policies and algorithms were followed during the patient's care in the ED. Final Clinical Impression(s) / ED Diagnoses Final diagnoses:  Community acquired pneumonia, unspecified laterality    Rx / DC Orders ED Discharge Orders    None       Isla Pence, MD 02/20/21 2107

## 2021-02-20 NOTE — ED Triage Notes (Signed)
Emergency Medicine Provider Triage Evaluation Note  Lee Barnes , a 70 y.o. male  was evaluated in triage.  Pt complains of fever and difficulty swallowing.  He had fever PTA.  Difficulty swallowing is chronic, at risk of aspiration PNA.  Took tylenol PTA.  No shortness of breath.    Physical Exam  BP (!) 157/120 (BP Location: Right Arm)   Pulse (!) 119   Temp 99 F (37.2 C) (Oral)   Resp 20   SpO2 93%  Patient is awake and alert.  Tachycardic.  He answers questions with out difficulty.   Medical Decision Making  Medically screening exam initiated at 5:47 PM.  Appropriate orders placed.  Lee Barnes was informed that the remainder of the evaluation will be completed by another provider, this initial triage assessment does not replace that evaluation, and the importance of remaining in the ED until their evaluation is complete.  Patient moved to back.  Concern for SIRS/sepsis given tachycardia and fever.     Lee Barnes, [place] 02/20/21 1749

## 2021-02-20 NOTE — Telephone Encounter (Signed)
Probably wise to take him to the hospital with his previous history

## 2021-02-20 NOTE — ED Triage Notes (Signed)
Per patient, states he has a history of aspiration of PNA and difficulty swallowing-states he started having chills, fever, feeling flush

## 2021-02-20 NOTE — H&P (Addendum)
History and Physical    MAYLON HABERLE QVZ:[id number] DOB: 1951-08-10 DOA: 02/20/2021  PCP: Ronnald Nian, MD Patient coming from: Home  Chief Complaint: Fever  HPI: Lee Barnes is a 70 y.o. male with medical history significant of hypertension, chronic hyponatremia, oropharyngeal cancer status post radiation and chemo in 2011 with residual speech and swallowing difficulties worsened by history of mandibular fracture a few years ago, hospital admission in January 2022 for aspiration pneumonia presented to the ED with complaints of fever, shortness of breath, and chronic difficulty swallowing.  Tachycardic and tachypneic on arrival.  Labs showing WBC 9.4, hemoglobin 12.3 (stable), platelet count 254K.  Sodium 128 (no significant change from baseline), potassium 4.3, chloride 95, bicarb 23, BUN 15, creatinine 1.0, glucose 128.  Lactic acid 1.0.  INR 1.0.  Blood culture x2 pending.  Covid and influenza PCR negative.  Chest x-ray showing bibasilar infiltrates. Patient was given Unasyn and 1 L normal saline bolus.  History provided by the patient and his wife at bedside.  Wife states about 3 days ago patient went to a party and she thinks he might have aspirated while eating.  He normally has to eat slowly but when with people he tends to eat faster.  Today he had a temperature of 103 F at home and took Tylenol before coming into the emergency room.  He was also having chills.  Patient reports chronic cough which he thinks is due to allergies.  Reports chronic difficulty swallowing due to prior history of tongue cancer 10 years ago and also history of mandibular fracture a few years ago.  He is vaccinated against COVID.  He felt nauseous earlier but did not vomit.  Denies abdominal pain or diarrhea.  Per wife, his sodium is chronically low since he received cisplatin for his tongue cancer.  Review of Systems:  All systems reviewed and apart from history of presenting illness, are negative.  Past  Medical History:  Diagnosis Date  . Allergy to environmental factors   . Complication of anesthesia    "vageled" after surgery -overnite stay  . Diverticulosis 2008  . Heart murmur    hx of in childhood   . History of chemotherapy   . History of radiation therapy 11/05/10-12/26/10   r base tongue, 7000 cGy 35 sessions  . Hypertension    Medication started in May 2016.  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 Pneumonia    hx of 2012  . 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 Tinnitus     Past Surgical History:  Procedure Laterality Date  . APPENDECTOMY    . COLONOSCOPY    . INGUINAL HERNIA REPAIR Bilateral 03/30/2015   Procedure: LAPAROSCOPIC BILATERAL INGUINAL HERNIA REPAIR WITH MESH;  Surgeon: Abigail Miyamoto, MD;  Location: [facility];  Service: General;  Laterality: Bilateral;  . INGUINAL HERNIA REPAIR Right 1960  . INGUINAL HERNIA REPAIR Bilateral 03/30/2015  . INGUINAL HERNIA REPAIR Left 07/11/2015  . INGUINAL HERNIA REPAIR N/A 07/11/2015   Procedure: REPAIR OF LEFT INGUINAL HERNIA WITH MESH;  Surgeon: Abigail Miyamoto, MD;  [facility];  Service: General;  Laterality: N/A;  . INSERTION OF MESH N/A 07/11/2015   Procedure: INSERTION OF MESH;  Surgeon: Abigail Miyamoto, MD;  Location: [facility] OR;  Service: General;  Laterality: N/A;  . LUMBAR DISC SURGERY    . TONSILLECTOMY       reports that he has never smoked. He has never used smokeless tobacco. He  reports current alcohol use. He reports that he does not use drugs.  Allergies  Allergen Reactions  . Codeine Nausea And Vomiting    Family History  Problem Relation Age of Onset  . Heart disease Father   . Cancer Father        throat ca  . Heart disease Mother   . Colon cancer Neg Hx     Prior to Admission medications   Medication Sig Start Date End Date Taking? Authorizing Provider  acetaminophen (TYLENOL) 500 MG tablet Take 500 mg by mouth every 6 (six) hours as needed for mild pain or moderate pain.   Yes  [provider]  amLODipine (NORVASC) 5 MG tablet TAKE ONE TABLET BY MOUTH DAILY Patient taking differently: Take 5 mg by mouth every evening. 11/28/20  Yes Ronnald Nian, MD  cetirizine (ZYRTEC) 10 MG chewable tablet Chew 10 mg by mouth at bedtime.   Yes [provider]  fluticasone (FLONASE) 50 MCG/ACT nasal spray INSTILL TWO SPRAYS INTO EACH NOSTRIL DAILY AS NEEDED Patient taking differently: Place 2 sprays into both nostrils daily as needed for allergies. INSTILL TWO SPRAYS INTO EACH NOSTRIL DAILY AS NEEDED 09/05/20  Yes Ronnald Nian, MD  amoxicillin-clavulanate (AUGMENTIN) 875-125 MG tablet Take 1 tablet by mouth every 12 (twelve) hours. Patient not taking: No sig reported 12/13/20   Alwyn Ren, MD  ondansetron (ZOFRAN) 8 MG tablet Take 1 tablet (8 mg total) by mouth every 8 (eight) hours as needed for nausea or vomiting. Patient not taking: No sig reported 03/27/18   Ronnald Nian, MD    Physical Exam: Vitals:   02/20/21 1945 02/20/21 1958 02/20/21 2000 02/20/21 2045  BP: 131/83  126/88 129/84  Pulse: 85  83 81  Resp: 15  16 16   Temp:  98.8 F (37.1 C)    TempSrc:  Oral    SpO2: 94%  93% 93%    Physical Exam Constitutional:      General: He is not in acute distress. HENT:     Head: Normocephalic and atraumatic.  Eyes:     Extraocular Movements: Extraocular movements intact.     Conjunctiva/sclera: Conjunctivae normal.  Cardiovascular:     Rate and Rhythm: Normal rate and regular rhythm.     Pulses: Normal pulses.  Pulmonary:     Effort: Pulmonary effort is normal. No respiratory distress.     Breath sounds: No wheezing.  Abdominal:     General: Bowel sounds are normal. There is no distension.     Palpations: Abdomen is soft.     Tenderness: There is no abdominal tenderness.  Musculoskeletal:        General: No swelling or tenderness.     Cervical back: Normal range of motion and neck supple.  Skin:    General: Skin is warm and dry.   Neurological:     General: No focal deficit present.     Mental Status: He is alert and oriented to person, place, and time.     Labs on Admission: I have personally reviewed following labs and imaging studies  CBC: Recent Labs  Lab 02/20/21 1746  WBC 9.4  NEUTROABS 8.4*  HGB 12.3*  HCT 36.4*  MCV 83.5  PLT 254   Basic Metabolic Panel: Recent Labs  Lab 02/20/21 1746  NA 128*  K 4.3  CL 95*  CO2 23  GLUCOSE 128*  BUN 15  CREATININE 1.05  CALCIUM 9.1   GFR: CrCl cannot be calculated (  Unknown ideal weight.). Liver Function Tests: Recent Labs  Lab 02/20/21 1746  AST 16  ALT 12  ALKPHOS 88  BILITOT 0.7  PROT 7.9  ALBUMIN 4.0   No results for input(s): LIPASE, AMYLASE in the last 168 hours. No results for input(s): AMMONIA in the last 168 hours. Coagulation Profile: Recent Labs  Lab 02/20/21 1746  INR 1.0   Cardiac Enzymes: No results for input(s): CKTOTAL, CKMB, CKMBINDEX, TROPONINI in the last 168 hours. BNP (last 3 results) No results for input(s): PROBNP in the last 8760 hours. HbA1C: No results for input(s): HGBA1C in the last 72 hours. CBG: No results for input(s): GLUCAP in the last 168 hours. Lipid Profile: No results for input(s): CHOL, HDL, LDLCALC, TRIG, CHOLHDL, LDLDIRECT in the last 72 hours. Thyroid Function Tests: No results for input(s): TSH, T4TOTAL, FREET4, T3FREE, THYROIDAB in the last 72 hours. Anemia Panel: No results for input(s): VITAMINB12, FOLATE, FERRITIN, TIBC, IRON, RETICCTPCT in the last 72 hours. Urine analysis:    Component Value Date/Time   COLORURINE STRAW (A) 02/20/2021 1746   APPEARANCEUR CLEAR 02/20/2021 1746   LABSPEC 1.009 02/20/2021 1746   LABSPEC 1.015 12/13/2010 1028   PHURINE 8.0 02/20/2021 1746   GLUCOSEU NEGATIVE 02/20/2021 1746   HGBUR SMALL (A) 02/20/2021 1746   BILIRUBINUR NEGATIVE 02/20/2021 1746   BILIRUBINUR n 02/19/2016 1519   BILIRUBINUR Negative 12/13/2010 1028   KETONESUR NEGATIVE  02/20/2021 1746   PROTEINUR NEGATIVE 02/20/2021 1746   UROBILINOGEN negative 02/19/2016 1519   UROBILINOGEN 0.2 04/01/2015 2316   NITRITE NEGATIVE 02/20/2021 1746   LEUKOCYTESUR NEGATIVE 02/20/2021 1746   LEUKOCYTESUR Negative 12/13/2010 1028    Radiological Exams on Admission: DG Chest Portable 1 View  Result Date: 02/20/2021 CLINICAL DATA:  Fever. EXAM: PORTABLE CHEST 1 VIEW COMPARISON:  December 11, 2020. FINDINGS: The heart size and mediastinal contours are within normal limits. No pneumothorax or pleural effusion is noted. Mild right basilar subsegmental atelectasis or infiltrate is noted. Slightly increased left basilar opacity is noted concerning for developing infiltrate. The visualized skeletal structures are unremarkable. IMPRESSION: Mild right basilar subsegmental atelectasis or infiltrate. Slightly increased left basilar opacity is noted concerning for developing infiltrate. Electronically Signed   By: Lupita Raider M.D.   On: 02/20/2021 18:17   Assessment/Plan Principal Problem:   Aspiration pneumonia (HCC) Active Problems:   Essential hypertension   Chronic hyponatremia   Sepsis (HCC)   Sepsis secondary to aspiration pneumonia Febrile at home with temperature 103 F and was having chills.  He took Tylenol before coming into the ED.  Tachycardic and tachypneic on arrival to the ED, now resolved.  No leukocytosis, lactic acidosis, or hypotension.  Currently satting between 92-94% on room air at rest.  Covid and influenza PCR negative.  Chest x-ray showing bibasilar infiltrates. -Continue Unasyn.  Blood culture x2 pending.  Keep n.p.o., IV fluid hydration, aspiration precautions, SLP eval.  Continuous pulse ox, supplemental oxygen as needed.  Chronic hyponatremia Sodium 128, not significantly changed from baseline.  Likely due to poor oral intake. -Gentle IV fluid hydration, continue to monitor.  Check serum osmolarity.  Hypertension Blood pressure stable. -Hold p.o. meds  until SLP eval is done.  DVT prophylaxis: Lovenox Code Status: Patient wishes to be full code. Family Communication: Wife at bedside. Disposition Plan: Status is: Observation  The patient remains OBS appropriate and will d/c before 2 midnights.  Dispo: The patient is from: Home  Anticipated d/c is to: Home              Patient currently is not medically stable to d/c.   Difficult to place patient No  Level of care: Level of care: Med-Surg   The medical decision making on this patient was of high complexity and the patient is at high risk for clinical deterioration, therefore this is a level 3 visit.  John Giovanni MD [facility]  If 7PM-7AM, please contact night-coverage [email or web address]  02/20/2021, 10:03 PM

## 2021-02-21 ENCOUNTER — Ambulatory Visit: Payer: Self-pay | Admitting: Family Medicine

## 2021-02-21 DIAGNOSIS — J690 Pneumonitis due to inhalation of food and vomit: Secondary | ICD-10-CM | POA: Diagnosis not present

## 2021-02-21 LAB — OSMOLALITY: Osmolality: 271 mOsm/kg — ABNORMAL LOW (ref 275–295)

## 2021-02-21 MED ORDER — AMOXICILLIN-POT CLAVULANATE 875-125 MG PO TABS: 1.0000 | ORAL_TABLET | Freq: Two times a day (BID) | ORAL | 0 refills | Status: AC

## 2021-02-21 MED ORDER — HYDRALAZINE HCL 20 MG/ML IJ SOLN
10.0000 mg | Freq: Four times a day (QID) | INTRAMUSCULAR | Status: DC | PRN
  Administered 2021-02-21: 10 mg via INTRAVENOUS
  Filled 2021-02-21: qty 1

## 2021-02-21 MED ORDER — AMLODIPINE BESYLATE 5 MG PO TABS: 5.0000 mg | ORAL_TABLET | Freq: Every day | ORAL | Status: DC

## 2021-02-21 MED ORDER — SODIUM CHLORIDE 0.9 % IV SOLN: INTRAVENOUS | Status: DC

## 2021-02-21 NOTE — Progress Notes (Signed)
Physician Discharge Summary  Lee Barnes UJW:[id number] DOB: 1951/10/26 DOA: 02/20/2021  PCP: Ronnald Nian, MD  Admit date: 02/20/2021 Discharge date: 02/21/2021  Admitted From: Home Discharge disposition: Home   Code Status: Full Code  Diet Recommendation: Dysphagia 3 diet with thin liquid  Discharge Diagnosis:   Principal Problem:   Aspiration pneumonia [facility]) Active Problems:   Essential hypertension   Chronic hyponatremia   Sepsis [facility])  Chief Complaint  Patient presents with  . Fever   Brief narrative: Lee Barnes is a 70 y.o. 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 Patient presented to the ED on 4/5 with complaint of fever, shortness of breath, chronic difficulty swallowing.  Per family, patient was in a party 3 days prior and might have aspirated while eating.  He had a temperature of 103 with chills at home for which he took Tylenol  In the ED, patient was tachycardic, tachypneic. Labs showed WBC count of 9.4, hemoglobin 12.3 (stable), platelet count 254K.  Sodium 128 (no significant change from baseline), potassium 4.3, chloride 95, bicarb 23, BUN 15, creatinine 1.0, glucose 128.  Lactic acid 1.0.  INR 1.0.  Blood culture x2 pending.  Covid and influenza PCR negative.   Chest x-ray showed bibasilar infiltrates.  Patient was admitted to hospitalist service for aspiration pneumonia and started on IV Unasyn.    Subjective: Patient was seen and examined this morning. Elderly Caucasian male.  Lying on bed.  Not in distress  Assessment/Plan: Sepsis secondary to aspiration pneumonia -Patient reports of fever of 103 F with chills at home.  He took Tylenol before coming into the ED.   -On arrival he was,  tachycardic and tachypneic without leukocytosis or lactic acidosis or hypotension.   -Currently on IV Unasyn.  Not on supplemental oxygen. 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 -Evaluated by speech therapist today.  Recommended for dysphagia 3/10 liquid diet at home.  Patient will follow up with his ENT doctor Dr. Ezzard Standing as an outpatient.  Chronic hyponatremia -Sodium 128, not significantly changed from baseline.  Likely due to poor oral intake. -Gentle IV fluid hydration, continue to monitor.   Recent Labs  Lab 02/20/21 1746  NA 128*   Hypertension -Amlodipine resumed.  Hydralazine as needed.   Wound care: Incision (Closed) 03/30/15 Abdomen Bilateral (Active)  Date First Assessed/Time First Assessed: 03/30/15 1046   Location: Abdomen  Location Orientation: Bilateral    No assessment data to display     No Linked orders to display     Incision - 3 Ports Abdomen 1: Superior 2: Mid 3: Lower (Active)  Placement Date/Time: 03/30/15 1027   Location of Ports: Abdomen  Port: 1:  Location Orientation: Superior  Port: 2:  Location Orientation: Mid  Port: 3:  Location Orientation: Lower    Assessments 03/30/2015 11:09 AM 04/05/2015  9:21 AM  Port 1 Site Assessment [employer] 1 Margins Attached edges (approximated) Attached edges (approximated)  Port 1 Drainage Amount None None  Port 1 Dressing Type  Liquid skin adhesive Liquid skin adhesive  Port 1 Dressing Status Intact --  Port 2 Site Assessment Dry Clean;Dry  Port 2 Margins -- Attached edges (approximated)  Port 2 Drainage Amount None None  Port 2 Dressing Type Liquid skin adhesive Liquid skin adhesive  Port 2 Dressing Status Intact --  Port 3  Site Assessment Dry Clean;Dry  Port 3 Margins -- Attached edges (approximated)  Port 3 Drainage Amount None None  Port 3 Dressing Type Liquid skin adhesive Liquid skin adhesive  Port 3 Dressing Status Dry --     No Linked orders to display     Incision (Closed) 07/11/15 Abdomen Left (Active)  Date First Assessed/Time First Assessed: 07/11/15 1356   Location: Abdomen  Location Orientation: Left    Assessments 07/11/2015  2:15 PM 07/12/2015  8:15 AM  Dressing Type Liquid skin adhesive Liquid skin adhesive  Dressing Clean;Dry;Intact Clean;Dry;Intact  Site / Wound Assessment -- Clean;Dry  Margins -- Attached edges (approximated)  Closure -- Skin glue     No Linked orders to display     Wound / Incision (Open or Dehisced) 03/28/18 Other (Comment) Jaw Left incision X 2 with stitches (Active)  Date First Assessed/Time First Assessed: 03/28/18 1455   Wound Type: Other (Comment)  Location: Jaw  Location Orientation: Left  Wound Description (Comments): incision X 2 with stitches  Present on Admission: Yes    Assessments 03/29/2018  7:30 AM 03/31/2018 10:30 AM  Dressing Type None None  Closure Sutures Sutures  Drainage Amount None None     No Linked orders to display    Discharge Exam:   Vitals:   02/20/21 2210 02/21/21 0207 02/21/21 0606 02/21/21 1002  BP: (!) 148/92 121/87 140/77 (!) 170/104  Pulse: 80 67 68 80  Resp: 15 15 18 16   Temp: 98.3 F (36.8 C) 97.6 F (36.4 C) (!) 97.5 F (36.4 C)   TempSrc: Oral Oral Oral   SpO2: 94% 96% 97% 95%    There is no height or weight on file to calculate BMI.  General exam: Pleasant elderly Caucasian male.  Not in distress Skin: No rashes, lesions or ulcers. HEENT: Atraumatic, normocephalic, no obvious bleeding Lungs: Clear to auscultation bilaterally CVS: Regular rate and rhythm, no murmur GI/Abd soft, nontender, nondistended, bowel sound present CNS: Alert, awake, oriented x3 Psychiatry: Mood appropriate Extremities: No pedal edema, no  calf tenderness  Follow ups:   Discharge Instructions    Increase activity slowly   Complete by: As directed       Follow-up Information    Ronnald Nian, MD Follow up.   Specialty: Family Medicine Contact information: [address] [place] [address] [phone]        Drema Halon, MD Follow up.   Specialty: Otolaryngology Contact information: [address] [place] [address] [phone]               Recommendations for Outpatient Follow-Up:   1. Follow-up with PCP as an outpatient 2. Follow-up with ENT as an outpatient  Discharge Instructions:  Follow with Primary MD Ronnald Nian, MD in 7 days   Get CBC/BMP checked in next visit within 1 week by PCP or SNF MD ( we routinely change or add medications that can affect your baseline labs and fluid status, therefore we recommend that you get the mentioned basic workup next visit with your PCP, your PCP may decide not to get them or add new tests based on their clinical decision)  On your next visit with your PCP, please Get Medicines reviewed and adjusted.  Please request your PCP  to go over all Hospital Tests and Procedure/Radiological results at the follow up, please get all Hospital records sent to your Prim MD by signing hospital release before you go home.  Activity: As tolerated with Full fall precautions use walker/cane & assistance as needed  For Heart failure patients - Check your Weight same time everyday, if you gain over 2 pounds, or you develop in leg swelling, experience more shortness of breath or chest pain, call your Primary MD immediately. Follow Cardiac Low Salt Diet and 1.5 lit/day fluid restriction.  If you have smoked or chewed Tobacco in the last 2 yrs please stop smoking, stop any regular Alcohol  and or any Recreational drug use.  If you experience worsening of your admission symptoms, develop shortness of breath, life threatening emergency,  suicidal or homicidal thoughts you must seek medical attention immediately by calling 911 or calling your MD immediately  if symptoms less severe.  You Must read complete instructions/literature along with all the possible adverse reactions/side effects for all the Medicines you take and that have been prescribed to you. Take any new Medicines after you have completely understood and accpet all the possible adverse reactions/side effects.   Do not drive, operate heavy machinery, perform activities at heights, swimming or participation in water activities or provide baby sitting services if your were admitted for syncope or siezures until you have seen by Primary MD or a Neurologist and advised to do so again.  Do not drive when taking Pain medications.  Do not take more than prescribed Pain, Sleep and Anxiety Medications  Wear Seat belts while driving.   Please note You were cared for by a hospitalist during your hospital stay. If you have any questions about your discharge medications or the care you received while you were in the hospital after you are discharged, you can call the unit and asked to speak with the hospitalist on call if the hospitalist that took care of you is not available. Once you are discharged, your primary care physician will handle any further medical issues. Please note that NO REFILLS for any discharge medications will be authorized once you are discharged, as it is imperative that you return to your primary care physician (or establish a relationship with a primary care physician if you do not have one) for your aftercare needs so that they can reassess your need for medications and monitor your lab values.    Allergies as of 02/21/2021      Reactions   Codeine Nausea And Vomiting      Medication List    STOP taking these medications   ondansetron 8 MG tablet Commonly known as: Zofran     TAKE these medications   acetaminophen 500 MG tablet Commonly known as:  TYLENOL Take 500 mg by mouth every 6 (six) hours as needed for mild pain or moderate pain.   amLODipine 5 MG tablet Commonly known as: NORVASC TAKE ONE TABLET BY MOUTH DAILY What changed: when to take this   amoxicillin-clavulanate 875-125 MG tablet Commonly known as: AUGMENTIN Take 1 tablet by mouth every 12 (twelve) hours for 3 days.   cetirizine 10 MG chewable tablet Commonly known as: ZYRTEC Chew 10 mg by mouth at bedtime.   fluticasone 50 MCG/ACT nasal spray Commonly known as: FLONASE INSTILL TWO SPRAYS INTO EACH NOSTRIL DAILY AS NEEDED What changed:   how much  to take  how to take this  when to take this  reasons to take this       Time coordinating discharge: 35 minutes  The results of significant diagnostics from this hospitalization (including imaging, microbiology, ancillary and laboratory) are listed below for reference.    Procedures and Diagnostic Studies:   DG Chest Portable 1 View  Result Date: 02/20/2021 CLINICAL DATA:  Fever. EXAM: PORTABLE CHEST 1 VIEW COMPARISON:  December 11, 2020. FINDINGS: The heart size and mediastinal contours are within normal limits. No pneumothorax or pleural effusion is noted. Mild right basilar subsegmental atelectasis or infiltrate is noted. Slightly increased left basilar opacity is noted concerning for developing infiltrate. The visualized skeletal structures are unremarkable. IMPRESSION: Mild right basilar subsegmental atelectasis or infiltrate. Slightly increased left basilar opacity is noted concerning for developing infiltrate. Electronically Signed   By: Lupita Raider M.D.   On: 02/20/2021 18:17     Labs:   Basic Metabolic Panel: Recent Labs  Lab 02/20/21 1746  NA 128*  K 4.3  CL 95*  CO2 23  GLUCOSE 128*  BUN 15  CREATININE 1.05  CALCIUM 9.1   GFR CrCl cannot be calculated (Unknown ideal weight.). Liver Function Tests: Recent Labs  Lab 02/20/21 1746  AST 16  ALT 12  ALKPHOS 88  BILITOT 0.7   PROT 7.9  ALBUMIN 4.0   No results for input(s): LIPASE, AMYLASE in the last 168 hours. No results for input(s): AMMONIA in the last 168 hours. Coagulation profile Recent Labs  Lab 02/20/21 1746  INR 1.0    CBC: Recent Labs  Lab 02/20/21 1746  WBC 9.4  NEUTROABS 8.4*  HGB 12.3*  HCT 36.4*  MCV 83.5  PLT 254   Cardiac Enzymes: No results for input(s): CKTOTAL, CKMB, CKMBINDEX, TROPONINI in the last 168 hours. BNP: Invalid input(s): POCBNP CBG: No results for input(s): GLUCAP in the last 168 hours. D-Dimer No results for input(s): DDIMER in the last 72 hours. Hgb A1c No results for input(s): HGBA1C in the last 72 hours. Lipid Profile No results for input(s): CHOL, HDL, LDLCALC, TRIG, CHOLHDL, LDLDIRECT in the last 72 hours. Thyroid function studies No results for input(s): TSH, T4TOTAL, T3FREE, THYROIDAB in the last 72 hours.  Invalid input(s): FREET3 Anemia work up No results for input(s): VITAMINB12, FOLATE, FERRITIN, TIBC, IRON, RETICCTPCT in the last 72 hours. Microbiology Recent Results (from the past 240 hour(s))  Culture, blood (routine x 2)     Status: None (Preliminary result)   Collection Time: 02/20/21  5:46 PM   Specimen: BLOOD  Result Value Ref Range Status   Specimen Description   Final    BLOOD RIGHT ANTECUBITAL Performed at [facility], [address] [place], [place] [address]    Special Requests   Final    BOTTLES DRAWN AEROBIC AND ANAEROBIC Blood Culture adequate volume Performed at [facility], [address] [place], [place] [address]    Culture   Final    NO GROWTH < 24 HOURS Performed at [facility], [address], [place], [place] [address]    Report Status PENDING  Incomplete  Culture, blood (routine x 2)     Status: None (Preliminary result)   Collection Time: 02/20/21  5:51 PM   Specimen: BLOOD  Result Value Ref Range Status   Specimen Description   Final    BLOOD LEFT  ARM Performed at [facility], [address] [place],  [place] [address]    Special Requests   Final    BOTTLES DRAWN AEROBIC ONLY Blood Culture adequate volume Performed at [facility], [address], [place], [place] [address]    Culture   Final    NO GROWTH < 24 HOURS Performed at [facility], [address] [place], [place] [address]    Report Status PENDING  Incomplete  Resp Panel by RT-PCR (Flu A&B, Covid) Nasopharyngeal Swab     Status: None   Collection Time: 02/20/21  6:23 PM   Specimen: Nasopharyngeal Swab; Nasopharyngeal(NP) swabs in vial transport medium  Result Value Ref Range Status   SARS Coronavirus 2 by RT PCR NEGATIVE NEGATIVE Final    Comment: (NOTE) SARS-CoV-2 target nucleic acids are NOT DETECTED.  The SARS-CoV-2 RNA is generally detectable in upper respiratory specimens during the acute phase of infection. The lowest concentration of SARS-CoV-2 viral copies this assay can detect is 138 copies/mL. A negative result does not preclude SARS-Cov-2 infection and should not be used as the sole basis for treatment or other patient management decisions. A negative result may occur with  improper specimen collection/handling, submission of specimen other than nasopharyngeal swab, presence of viral mutation(s) within the areas targeted by this assay, and inadequate number of viral copies(<138 copies/mL). A negative result must be combined with clinical observations, patient history, and epidemiological information. The expected result is Negative.  Fact Sheet for Patients:  BloggerCourse.com  Fact Sheet for Healthcare Providers:  SeriousBroker.it  This test is no t yet approved or cleared by the [place] FDA and  has been authorized for detection and/or diagnosis of SARS-CoV-2 by FDA under an Emergency Use Authorization (EUA). This EUA will remain  in effect  (meaning this test can be used) for the duration of the COVID-19 declaration under Section 564(b)(1) of the Act, 21 U.S.C.section 360bbb-3(b)(1), unless the authorization is terminated  or revoked sooner.       Influenza A by PCR NEGATIVE NEGATIVE Final   Influenza B by PCR NEGATIVE NEGATIVE Final    Comment: (NOTE) The Xpert Xpress SARS-CoV-2/FLU/RSV plus assay is intended as an aid in the diagnosis of influenza from Nasopharyngeal swab specimens and should not be used as a sole basis for treatment. Nasal washings and aspirates are unacceptable for Xpert Xpress SARS-CoV-2/FLU/RSV testing.  Fact Sheet for Patients: BloggerCourse.com  Fact Sheet for Healthcare Providers: SeriousBroker.it  This test is not yet approved or cleared by the [place] FDA and has been authorized for detection and/or diagnosis of SARS-CoV-2 by FDA under an Emergency Use Authorization (EUA). This EUA will remain in effect (meaning this test can be used) for the duration of the COVID-19 declaration under Section 564(b)(1) of the Act, 21 U.S.C. section 360bbb-3(b)(1), unless the authorization is terminated or revoked.  Performed at [facility], [address], [place], [place] [address]      Signed: Melina Schools Arlynn Stare  [facility] 02/21/2021, 3:47 PM

## 2021-02-21 NOTE — Discharge Summary (Signed)
Physician Discharge Summary  Lee Barnes UJW:[id number] DOB: 1951/10/26 DOA: 02/20/2021  PCP: Ronnald Nian, MD  Admit date: 02/20/2021 Discharge date: 02/21/2021  Admitted From: Home Discharge disposition: Home   Code Status: Full Code  Diet Recommendation: Dysphagia 3 diet with thin liquid  Discharge Diagnosis:   Principal Problem:   Aspiration pneumonia [facility]) Active Problems:   Essential hypertension   Chronic hyponatremia   Sepsis [facility])  Chief Complaint  Patient presents with  . Fever   Brief narrative: Lee Barnes is a 70 y.o. 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 Patient presented to the ED on 4/5 with complaint of fever, shortness of breath, chronic difficulty swallowing.  Per family, patient was in a party 3 days prior and might have aspirated while eating.  He had a temperature of 103 with chills at home for which he took Tylenol  In the ED, patient was tachycardic, tachypneic. Labs showed WBC count of 9.4, hemoglobin 12.3 (stable), platelet count 254K.  Sodium 128 (no significant change from baseline), potassium 4.3, chloride 95, bicarb 23, BUN 15, creatinine 1.0, glucose 128.  Lactic acid 1.0.  INR 1.0.  Blood culture x2 pending.  Covid and influenza PCR negative.   Chest x-ray showed bibasilar infiltrates.  Patient was admitted to hospitalist service for aspiration pneumonia and started on IV Unasyn.    Subjective: Patient was seen and examined this morning. Elderly Caucasian male.  Lying on bed.  Not in distress  Assessment/Plan: Sepsis secondary to aspiration pneumonia -Patient reports of fever of 103 F with chills at home.  He took Tylenol before coming into the ED.   -On arrival he was,  tachycardic and tachypneic without leukocytosis or lactic acidosis or hypotension.   -Currently on IV Unasyn.  Not on supplemental oxygen. 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 -Evaluated by speech therapist today.  Recommended for dysphagia 3/10 liquid diet at home.  Patient will follow up with his ENT doctor Dr. Ezzard Standing as an outpatient.  Chronic hyponatremia -Sodium 128, not significantly changed from baseline.  Likely due to poor oral intake. -Gentle IV fluid hydration, continue to monitor.   Recent Labs  Lab 02/20/21 1746  NA 128*   Hypertension -Amlodipine resumed.  Hydralazine as needed.   Wound care: Incision (Closed) 03/30/15 Abdomen Bilateral (Active)  Date First Assessed/Time First Assessed: 03/30/15 1046   Location: Abdomen  Location Orientation: Bilateral    No assessment data to display     No Linked orders to display     Incision - 3 Ports Abdomen 1: Superior 2: Mid 3: Lower (Active)  Placement Date/Time: 03/30/15 1027   Location of Ports: Abdomen  Port: 1:  Location Orientation: Superior  Port: 2:  Location Orientation: Mid  Port: 3:  Location Orientation: Lower    Assessments 03/30/2015 11:09 AM 04/05/2015  9:21 AM  Port 1 Site Assessment [employer] 1 Margins Attached edges (approximated) Attached edges (approximated)  Port 1 Drainage Amount None None  Port 1 Dressing Type  Liquid skin adhesive Liquid skin adhesive  Port 1 Dressing Status Intact --  Port 2 Site Assessment Dry Clean;Dry  Port 2 Margins -- Attached edges (approximated)  Port 2 Drainage Amount None None  Port 2 Dressing Type Liquid skin adhesive Liquid skin adhesive  Port 2 Dressing Status Intact --  Port 3  Site Assessment Dry Clean;Dry  Port 3 Margins -- Attached edges (approximated)  Port 3 Drainage Amount None None  Port 3 Dressing Type Liquid skin adhesive Liquid skin adhesive  Port 3 Dressing Status Dry --     No Linked orders to display     Incision (Closed) 07/11/15 Abdomen Left (Active)  Date First Assessed/Time First Assessed: 07/11/15 1356   Location: Abdomen  Location Orientation: Left    Assessments 07/11/2015  2:15 PM 07/12/2015  8:15 AM  Dressing Type Liquid skin adhesive Liquid skin adhesive  Dressing Clean;Dry;Intact Clean;Dry;Intact  Site / Wound Assessment -- Clean;Dry  Margins -- Attached edges (approximated)  Closure -- Skin glue     No Linked orders to display     Wound / Incision (Open or Dehisced) 03/28/18 Other (Comment) Jaw Left incision X 2 with stitches (Active)  Date First Assessed/Time First Assessed: 03/28/18 1455   Wound Type: Other (Comment)  Location: Jaw  Location Orientation: Left  Wound Description (Comments): incision X 2 with stitches  Present on Admission: Yes    Assessments 03/29/2018  7:30 AM 03/31/2018 10:30 AM  Dressing Type None None  Closure Sutures Sutures  Drainage Amount None None     No Linked orders to display    Discharge Exam:   Vitals:   02/20/21 2210 02/21/21 0207 02/21/21 0606 02/21/21 1002  BP: (!) 148/92 121/87 140/77 (!) 170/104  Pulse: 80 67 68 80  Resp: 15 15 18 16   Temp: 98.3 F (36.8 C) 97.6 F (36.4 C) (!) 97.5 F (36.4 C)   TempSrc: Oral Oral Oral   SpO2: 94% 96% 97% 95%    There is no height or weight on file to calculate BMI.  General exam: Pleasant elderly Caucasian male.  Not in distress Skin: No rashes, lesions or ulcers. HEENT: Atraumatic, normocephalic, no obvious bleeding Lungs: Clear to auscultation bilaterally CVS: Regular rate and rhythm, no murmur GI/Abd soft, nontender, nondistended, bowel sound present CNS: Alert, awake, oriented x3 Psychiatry: Mood appropriate Extremities: No pedal edema, no  calf tenderness  Follow ups:   Discharge Instructions    Increase activity slowly   Complete by: As directed       Follow-up Information    Ronnald Nian, MD Follow up.   Specialty: Family Medicine Contact information: [address] [place] [address] [phone]        Drema Halon, MD Follow up.   Specialty: Otolaryngology Contact information: [address] [place] [address] [phone]               Recommendations for Outpatient Follow-Up:   1. Follow-up with PCP as an outpatient 2. Follow-up with ENT as an outpatient  Discharge Instructions:  Follow with Primary MD Ronnald Nian, MD in 7 days   Get CBC/BMP checked in next visit within 1 week by PCP or SNF MD ( we routinely change or add medications that can affect your baseline labs and fluid status, therefore we recommend that you get the mentioned basic workup next visit with your PCP, your PCP may decide not to get them or add new tests based on their clinical decision)  On your next visit with your PCP, please Get Medicines reviewed and adjusted.  Please request your PCP  to go over all Hospital Tests and Procedure/Radiological results at the follow up, please get all Hospital records sent to your Prim MD by signing hospital release before you go home.  Activity: As tolerated with Full fall precautions use walker/cane & assistance as needed  For Heart failure patients - Check your Weight same time everyday, if you gain over 2 pounds, or you develop in leg swelling, experience more shortness of breath or chest pain, call your Primary MD immediately. Follow Cardiac Low Salt Diet and 1.5 lit/day fluid restriction.  If you have smoked or chewed Tobacco in the last 2 yrs please stop smoking, stop any regular Alcohol  and or any Recreational drug use.  If you experience worsening of your admission symptoms, develop shortness of breath, life threatening emergency,  suicidal or homicidal thoughts you must seek medical attention immediately by calling 911 or calling your MD immediately  if symptoms less severe.  You Must read complete instructions/literature along with all the possible adverse reactions/side effects for all the Medicines you take and that have been prescribed to you. Take any new Medicines after you have completely understood and accpet all the possible adverse reactions/side effects.   Do not drive, operate heavy machinery, perform activities at heights, swimming or participation in water activities or provide baby sitting services if your were admitted for syncope or siezures until you have seen by Primary MD or a Neurologist and advised to do so again.  Do not drive when taking Pain medications.  Do not take more than prescribed Pain, Sleep and Anxiety Medications  Wear Seat belts while driving.   Please note You were cared for by a hospitalist during your hospital stay. If you have any questions about your discharge medications or the care you received while you were in the hospital after you are discharged, you can call the unit and asked to speak with the hospitalist on call if the hospitalist that took care of you is not available. Once you are discharged, your primary care physician will handle any further medical issues. Please note that NO REFILLS for any discharge medications will be authorized once you are discharged, as it is imperative that you return to your primary care physician (or establish a relationship with a primary care physician if you do not have one) for your aftercare needs so that they can reassess your need for medications and monitor your lab values.    Allergies as of 02/21/2021      Reactions   Codeine Nausea And Vomiting      Medication List    STOP taking these medications   ondansetron 8 MG tablet Commonly known as: Zofran     TAKE these medications   acetaminophen 500 MG tablet Commonly known as:  TYLENOL Take 500 mg by mouth every 6 (six) hours as needed for mild pain or moderate pain.   amLODipine 5 MG tablet Commonly known as: NORVASC TAKE ONE TABLET BY MOUTH DAILY What changed: when to take this   amoxicillin-clavulanate 875-125 MG tablet Commonly known as: AUGMENTIN Take 1 tablet by mouth every 12 (twelve) hours for 3 days.   cetirizine 10 MG chewable tablet Commonly known as: ZYRTEC Chew 10 mg by mouth at bedtime.   fluticasone 50 MCG/ACT nasal spray Commonly known as: FLONASE INSTILL TWO SPRAYS INTO EACH NOSTRIL DAILY AS NEEDED What changed:   how much  to take  how to take this  when to take this  reasons to take this       Time coordinating discharge: 35 minutes  The results of significant diagnostics from this hospitalization (including imaging, microbiology, ancillary and laboratory) are listed below for reference.    Procedures and Diagnostic Studies:   DG Chest Portable 1 View  Result Date: 02/20/2021 CLINICAL DATA:  Fever. EXAM: PORTABLE CHEST 1 VIEW COMPARISON:  December 11, 2020. FINDINGS: The heart size and mediastinal contours are within normal limits. No pneumothorax or pleural effusion is noted. Mild right basilar subsegmental atelectasis or infiltrate is noted. Slightly increased left basilar opacity is noted concerning for developing infiltrate. The visualized skeletal structures are unremarkable. IMPRESSION: Mild right basilar subsegmental atelectasis or infiltrate. Slightly increased left basilar opacity is noted concerning for developing infiltrate. Electronically Signed   By: Lupita Raider M.D.   On: 02/20/2021 18:17     Labs:   Basic Metabolic Panel: Recent Labs  Lab 02/20/21 1746  NA 128*  K 4.3  CL 95*  CO2 23  GLUCOSE 128*  BUN 15  CREATININE 1.05  CALCIUM 9.1   GFR CrCl cannot be calculated (Unknown ideal weight.). Liver Function Tests: Recent Labs  Lab 02/20/21 1746  AST 16  ALT 12  ALKPHOS 88  BILITOT 0.7   PROT 7.9  ALBUMIN 4.0   No results for input(s): LIPASE, AMYLASE in the last 168 hours. No results for input(s): AMMONIA in the last 168 hours. Coagulation profile Recent Labs  Lab 02/20/21 1746  INR 1.0    CBC: Recent Labs  Lab 02/20/21 1746  WBC 9.4  NEUTROABS 8.4*  HGB 12.3*  HCT 36.4*  MCV 83.5  PLT 254   Cardiac Enzymes: No results for input(s): CKTOTAL, CKMB, CKMBINDEX, TROPONINI in the last 168 hours. BNP: Invalid input(s): POCBNP CBG: No results for input(s): GLUCAP in the last 168 hours. D-Dimer No results for input(s): DDIMER in the last 72 hours. Hgb A1c No results for input(s): HGBA1C in the last 72 hours. Lipid Profile No results for input(s): CHOL, HDL, LDLCALC, TRIG, CHOLHDL, LDLDIRECT in the last 72 hours. Thyroid function studies No results for input(s): TSH, T4TOTAL, T3FREE, THYROIDAB in the last 72 hours.  Invalid input(s): FREET3 Anemia work up No results for input(s): VITAMINB12, FOLATE, FERRITIN, TIBC, IRON, RETICCTPCT in the last 72 hours. Microbiology Recent Results (from the past 240 hour(s))  Culture, blood (routine x 2)     Status: None (Preliminary result)   Collection Time: 02/20/21  5:46 PM   Specimen: BLOOD  Result Value Ref Range Status   Specimen Description   Final    BLOOD RIGHT ANTECUBITAL Performed at [facility], [address] [place], [place] [address]    Special Requests   Final    BOTTLES DRAWN AEROBIC AND ANAEROBIC Blood Culture adequate volume Performed at [facility], [address] [place], [place] [address]    Culture   Final    NO GROWTH < 24 HOURS Performed at [facility], [address], [place], [place] [address]    Report Status PENDING  Incomplete  Culture, blood (routine x 2)     Status: None (Preliminary result)   Collection Time: 02/20/21  5:51 PM   Specimen: BLOOD  Result Value Ref Range Status   Specimen Description   Final    BLOOD LEFT  ARM Performed at [facility], [address] [place],  [place] [address]    Special Requests   Final    BOTTLES DRAWN AEROBIC ONLY Blood Culture adequate volume Performed at [facility], [address], [place], [place] [address]    Culture   Final    NO GROWTH < 24 HOURS Performed at [facility], [address] [place], [place] [address]    Report Status PENDING  Incomplete  Resp Panel by RT-PCR (Flu A&B, Covid) Nasopharyngeal Swab     Status: None   Collection Time: 02/20/21  6:23 PM   Specimen: Nasopharyngeal Swab; Nasopharyngeal(NP) swabs in vial transport medium  Result Value Ref Range Status   SARS Coronavirus 2 by RT PCR NEGATIVE NEGATIVE Final    Comment: (NOTE) SARS-CoV-2 target nucleic acids are NOT DETECTED.  The SARS-CoV-2 RNA is generally detectable in upper respiratory specimens during the acute phase of infection. The lowest concentration of SARS-CoV-2 viral copies this assay can detect is 138 copies/mL. A negative result does not preclude SARS-Cov-2 infection and should not be used as the sole basis for treatment or other patient management decisions. A negative result may occur with  improper specimen collection/handling, submission of specimen other than nasopharyngeal swab, presence of viral mutation(s) within the areas targeted by this assay, and inadequate number of viral copies(<138 copies/mL). A negative result must be combined with clinical observations, patient history, and epidemiological information. The expected result is Negative.  Fact Sheet for Patients:  BloggerCourse.com  Fact Sheet for Healthcare Providers:  SeriousBroker.it  This test is no t yet approved or cleared by the [place] FDA and  has been authorized for detection and/or diagnosis of SARS-CoV-2 by FDA under an Emergency Use Authorization (EUA). This EUA will remain  in effect  (meaning this test can be used) for the duration of the COVID-19 declaration under Section 564(b)(1) of the Act, 21 U.S.C.section 360bbb-3(b)(1), unless the authorization is terminated  or revoked sooner.       Influenza A by PCR NEGATIVE NEGATIVE Final   Influenza B by PCR NEGATIVE NEGATIVE Final    Comment: (NOTE) The Xpert Xpress SARS-CoV-2/FLU/RSV plus assay is intended as an aid in the diagnosis of influenza from Nasopharyngeal swab specimens and should not be used as a sole basis for treatment. Nasal washings and aspirates are unacceptable for Xpert Xpress SARS-CoV-2/FLU/RSV testing.  Fact Sheet for Patients: BloggerCourse.com  Fact Sheet for Healthcare Providers: SeriousBroker.it  This test is not yet approved or cleared by the [place] FDA and has been authorized for detection and/or diagnosis of SARS-CoV-2 by FDA under an Emergency Use Authorization (EUA). This EUA will remain in effect (meaning this test can be used) for the duration of the COVID-19 declaration under Section 564(b)(1) of the Act, 21 U.S.C. section 360bbb-3(b)(1), unless the authorization is terminated or revoked.  Performed at [facility], [address], [place], [place] [address]      Signed: Melina Schools Arlynn Stare  [facility] 02/21/2021, 3:47 PM

## 2021-02-21 NOTE — Progress Notes (Signed)
Speech Language Pathology Treatment: Dysphagia  Patient Details Name: Lee Barnes MRN: [id number] DOB: 1951-03-19 Today's Date: 02/21/2021 Time: 1530-1540 SLP Time Calculation (min) (ACUTE ONLY): 10 min  Assessment / Plan / Recommendation Clinical Impression  Education session re: recommendations to help pt manage his chronic dysphagia.  Reviewed importance of hydration with secretion management, especially due to pt's XRT impacting salivary glands. Per spouse, pt is on fluid restrictions/limitations due to renal function. Advised pt frequently rinse and expectorate with water to maintain hydration. Speaking to nephrologist re: hydration concerns may be beneficial.   Pt and his wife report pt requires approx 1.5 hours to eat due to his compensation strategies and dysphagia.   He reports eating is often a Air traffic controller.  Reviewed with pt alternatives besides milk to use for protein shakes including Almond, Coconut Milk, etc.  Encouraged him to maximize liquid nutrition and eat what he enjoys and can tolerate.   Pt's wife reports he becomes tired by the time he is done eating due to effort required to swallow.   Recommend monitor his nutrition/weight closely and make MD aware if concerns are present to prevent FTT.  Pt had a feeding tube in the past during his XRT.  Reviewed importance of oral care, brushing dentition/tongue TID and using NON alcohol rinse.  Follow up with pt's ENT as OP may be helpful to determine if intevention indicated for UES function.  Pt is aspirating secretions per view on MBS in 07/2020 and he admits his dysphagia is worse now than normal. Mobility and maintaining strength of cough  is important for airway protection.     Reinforced pt's need to conduct swallowing exercises again - as he admits he has stopped these.   Will provide with Shaker Exercise to improve UES opening in writing prior to dc.  All education completed and pt's wife documented recommendation on her iphone.  All  education completed - and no follow up needed at this time.  Thanks for this consult.  Pt and wife agreeable.    HPI HPI: Pt is a 70 yo male adm to [facility] and diagnosed with an asp pna.  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  Pt reports since this time he has had vocal hoarseness and some increasing dysphagia.  He had required a PEG during cancer treatements.  Pt had pna in January 2022 and now in April 2022,  His imaging study showed more right lobe involvement in January and now Left lobe.  He and wife present deny pt requiring heimlich manuever nor weight loss.   He is limited with fluid intake due to his renal function.  Pt has previously undergone MBS 07/2020 and at that time, pt reported sensation of food sticking in his throat - pointing proximal and distal.  He stated sometimes he will clear his throat after he is finished eating and food will come back up.  Pt reports continued occasion where a pill will come back up into his oral cavity after he senses it lodged in his pharynx.  He also admits to some nasal regurgitation of liquids at times.  Denies significant episode of aspiration occuring before current (April 2022) and prior (January 2022) admit.  Pt did admit that he suspects his consumed a meal too rapidly when eating at a restaurant with friends which may have contributed to aspiration.  Nasal drainage  Speech Language Pathology Treatment: Dysphagia  Patient Details Name: Lee Barnes MRN: [id number] DOB: 1951-03-19 Today's Date: 02/21/2021 Time: 1530-1540 SLP Time Calculation (min) (ACUTE ONLY): 10 min  Assessment / Plan / Recommendation Clinical Impression  Education session re: recommendations to help pt manage his chronic dysphagia.  Reviewed importance of hydration with secretion management, especially due to pt's XRT impacting salivary glands. Per spouse, pt is on fluid restrictions/limitations due to renal function. Advised pt frequently rinse and expectorate with water to maintain hydration. Speaking to nephrologist re: hydration concerns may be beneficial.   Pt and his wife report pt requires approx 1.5 hours to eat due to his compensation strategies and dysphagia.   He reports eating is often a Air traffic controller.  Reviewed with pt alternatives besides milk to use for protein shakes including Almond, Coconut Milk, etc.  Encouraged him to maximize liquid nutrition and eat what he enjoys and can tolerate.   Pt's wife reports he becomes tired by the time he is done eating due to effort required to swallow.   Recommend monitor his nutrition/weight closely and make MD aware if concerns are present to prevent FTT.  Pt had a feeding tube in the past during his XRT.  Reviewed importance of oral care, brushing dentition/tongue TID and using NON alcohol rinse.  Follow up with pt's ENT as OP may be helpful to determine if intevention indicated for UES function.  Pt is aspirating secretions per view on MBS in 07/2020 and he admits his dysphagia is worse now than normal. Mobility and maintaining strength of cough  is important for airway protection.     Reinforced pt's need to conduct swallowing exercises again - as he admits he has stopped these.   Will provide with Shaker Exercise to improve UES opening in writing prior to dc.  All education completed and pt's wife documented recommendation on her iphone.  All  education completed - and no follow up needed at this time.  Thanks for this consult.  Pt and wife agreeable.    HPI HPI: Pt is a 70 yo male adm to [facility] and diagnosed with an asp pna.  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  Pt reports since this time he has had vocal hoarseness and some increasing dysphagia.  He had required a PEG during cancer treatements.  Pt had pna in January 2022 and now in April 2022,  His imaging study showed more right lobe involvement in January and now Left lobe.  He and wife present deny pt requiring heimlich manuever nor weight loss.   He is limited with fluid intake due to his renal function.  Pt has previously undergone MBS 07/2020 and at that time, pt reported sensation of food sticking in his throat - pointing proximal and distal.  He stated sometimes he will clear his throat after he is finished eating and food will come back up.  Pt reports continued occasion where a pill will come back up into his oral cavity after he senses it lodged in his pharynx.  He also admits to some nasal regurgitation of liquids at times.  Denies significant episode of aspiration occuring before current (April 2022) and prior (January 2022) admit.  Pt did admit that he suspects his consumed a meal too rapidly when eating at a restaurant with friends which may have contributed to aspiration.  Nasal drainage

## 2021-02-21 NOTE — Evaluation (Signed)
Clinical/Bedside Swallow Evaluation Patient Details  Name: Lee Barnes MRN: [id number] Date of Birth: [birth year]  Today's Date: 02/21/2021 Time: SLP Start Time (ACUTE ONLY): 1445 SLP Stop Time (ACUTE ONLY): 1525 SLP Time Calculation (min) (ACUTE ONLY): 40 min  Past Medical History:  Past Medical History:  Diagnosis Date  . Allergy to environmental factors   . Complication of anesthesia    "vageled" after surgery -overnite stay  . Diverticulosis 2008  . Heart murmur    hx of in childhood   . History of chemotherapy   . History of radiation therapy 11/05/10-12/26/10   r base tongue, 7000 cGy 35 sessions  . Hypertension    Medication started in May 2016.  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 Pneumonia    hx of 2012  . 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 Tinnitus    Past Surgical History:  Past Surgical History:  Procedure Laterality Date  . APPENDECTOMY    . COLONOSCOPY    . INGUINAL HERNIA REPAIR Bilateral 03/30/2015   Procedure: LAPAROSCOPIC BILATERAL INGUINAL HERNIA REPAIR WITH MESH;  Surgeon: Abigail Miyamoto, MD;  Location: [facility];  Service: General;  Laterality: Bilateral;  . INGUINAL HERNIA REPAIR Right 1960  . INGUINAL HERNIA REPAIR Bilateral 03/30/2015  . INGUINAL HERNIA REPAIR Left 07/11/2015  . INGUINAL HERNIA REPAIR N/A 07/11/2015   Procedure: REPAIR OF LEFT INGUINAL HERNIA WITH MESH;  Surgeon: Abigail Miyamoto, MD;  Location: [facility] OR;  Service: General;  Laterality: N/A;  . INSERTION OF MESH N/A 07/11/2015   Procedure: INSERTION OF MESH;  Surgeon: Abigail Miyamoto, MD;  Location: [facility] OR;  Service: General;  Laterality: N/A;  . LUMBAR DISC SURGERY    . TONSILLECTOMY     HPI:  Pt is a 70 yo male adm to [facility] and diagnosed with an asp pna.  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  Pt reports since this time he has had vocal hoarseness  and some increasing dysphagia.  He had required a PEG during cancer treatements.  Pt had pna in January 2022 and now in April 2022,  His imaging study showed more right lobe involvement in January and now Left lobe.  He and wife present deny pt requiring heimlich manuever nor weight loss.   He is limited with fluid intake due to his renal function.  Pt has previously undergone MBS 07/2020 and at that time, pt reported sensation of food sticking in his throat - pointing proximal and distal.  He stated sometimes he will clear his throat after he is finished eating and food will come back up.  Pt reports continued occasion where a pill will come back up into his oral cavity after he senses it lodged in his pharynx.  He also admits to some nasal regurgitation of liquids at times.  Denies significant episode of aspiration occuring before current (April 2022) and prior (January 2022) admit.  Pt did admit that he suspects his consumed a meal too rapidly when eating at a restaurant with friends which may have contributed to aspiration.  Nasal drainage is reportedly an issue for this pt - Swallow eval ordered.   Assessment / Plan / Recommendation Clinical Impression  Pt presents with continued indications of oropharyngeal dysphagia due to his chemo/XRT 2011 into 2012.  He continues with xerostomic and viscous saliva and anatomical changes  due to intervention. Pt reports suspicion of difficulty with secretions and he questions post nasal drainage- noted frequent throat clearing prior to po.  Clinical indication of dysphagia c/b multiple swallows, throat clearing and cough post-swallow of thin.  SLP provided pt with verbal cues to cough strongly and spit if reflexively coughing during intake.  However pt unable to expectorate secretions - despite max cues.  Reviewed MBS with pt and his wife *who worked as Secondary school teacher with [employer]. Recommend dys3/thin with strict precautions to mitigate aspiration.  Pt's  dysphagia is chronic secondary to his XRT. SLP Visit Diagnosis: Dysphagia, oropharyngeal phase (R13.12)    Aspiration Risk  Mild aspiration risk    Diet Recommendation Dysphagia 3 (Mech soft);Thin liquid   Liquid Administration via: Cup Medication Administration: Other (Comment) (as pt desires but if problematic, take liquid form or crushed if not contraindicated) Supervision: Staff to assist with self feeding Compensations: Slow rate;Small sips/bites;Multiple dry swallows after each bite/sip;Other (Comment) (cough and expectorate if reflexively coughing with intake) Postural Changes: Seated upright at 90 degrees;Remain upright for at least 30 minutes after po intake    Other  Recommendations Recommended Consults: Other (Comment) (consider follow up with ENT as an OP, ENT is Dr Ezzard Standing per pt and wife) Oral Care Recommendations:  (Oral care TID)   Follow up Recommendations   FU with OP ENT to determine if UES tx indicated = asking ENT to examine recent MBS 07/2020     Frequency and Duration min 1 x/week  1 week       Prognosis Prognosis for Safe Diet Advancement: Good Barriers to Reach Goals: Severity of deficits;Time post onset      Swallow Study   General HPI: Pt is a 70 yo male adm to [facility] and diagnosed with an asp pna.  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  Pt reports since this time he has had vocal hoarseness and some increasing dysphagia.  He had required a PEG during cancer treatements.  Pt had pna in January 2022 and now in April 2022,  His imaging study showed more right lobe involvement in January and now Left lobe.  He and wife present deny pt requiring heimlich manuever nor weight loss.   He is limited with fluid intake due to his renal function.  Pt has previously undergone MBS 07/2020 and at that time, pt reported sensation of food sticking in his  throat - pointing proximal and distal.  He stated sometimes he will clear his throat after he is finished eating and food will come back up.  Pt reports continued occasion where a pill will come back up into his oral cavity after he senses it lodged in his pharynx.  He also admits to some nasal regurgitation of liquids at times.  Denies significant episode of aspiration occuring before current (April 2022) and prior (January 2022) admit.  Pt did admit that he suspects his consumed a meal too rapidly when eating at a restaurant with friends which may have contributed to aspiration.  Nasal drainage is reportedly an issue for this pt - Swallow eval ordered. Type of Study: Bedside Swallow Evaluation Previous Swallow Assessment: BSE 11/2020, MBS 07/2020 Diet Prior to this Study: NPO Temperature Spikes Noted: No Respiratory Status: Room air History of Recent Intubation: No Behavior/Cognition: Alert;Cooperative;Pleasant mood Oral Cavity Assessment: Dry Oral Care Completed by SLP: No  Vision: Functional for self-feeding Self-Feeding Abilities: Able to feed self Patient Positioning: Upright in bed Baseline Vocal Quality: Other (comment);Hoarse Volitional Cough: Strong Volitional Swallow: Able to elicit (with effort due to xerostomia)    Oral/Motor/Sensory Function Overall Oral Motor/Sensory Function: Other (comment) (obvious structural changes from prior cancer s/p surgical and radiation intervention)   Ice Chips Ice chips: Within functional limits Presentation: Spoon;Self Fed   Thin Liquid Thin Liquid: Impaired Presentation: Cup;Self Fed Pharyngeal  Phase Impairments: Cough - Delayed;Multiple swallows    Nectar Thick Nectar Thick Liquid: Not tested   Honey Thick Honey Thick Liquid: Not tested   Puree Puree: Within functional limits Presentation: Self Fed;Spoon   Solid     Solid: Impaired Presentation: Self Fed Pharyngeal Phase Impairments: Multiple swallows Other Comments: pt needed liquids to  faciliate sensation of clearance of cracker in pharynx      Chales Abrahams 02/21/2021,4:08 PM    Rolena Infante, MS [facility] SLP Acute Rehab Services Office [phone] Pager [phone]

## 2021-02-22 ENCOUNTER — Telehealth: Payer: Self-pay

## 2021-02-22 LAB — URINE CULTURE: Culture: NO GROWTH

## 2021-02-22 NOTE — Telephone Encounter (Signed)
I called the pt. Per my TOC report, he was recently in the hospital for pneumonia and I got him scheduled for a f/u here on 02/28/21, medications were gone over and reconciled.

## 2021-02-25 LAB — CULTURE, BLOOD (ROUTINE X 2)
Culture: NO GROWTH
Culture: NO GROWTH
Special Requests: ADEQUATE
Special Requests: ADEQUATE

## 2021-02-28 ENCOUNTER — Ambulatory Visit (INDEPENDENT_AMBULATORY_CARE_PROVIDER_SITE_OTHER): Payer: Medicare Other | Admitting: Family Medicine

## 2021-02-28 ENCOUNTER — Other Ambulatory Visit: Payer: Self-pay

## 2021-02-28 ENCOUNTER — Encounter: Payer: Self-pay | Admitting: Family Medicine

## 2021-02-28 VITALS — BP 168/90 | HR 79 | Temp 97.7°F | Wt 165.6 lb

## 2021-02-28 DIAGNOSIS — Z23 Encounter for immunization: Secondary | ICD-10-CM

## 2021-02-28 DIAGNOSIS — Z85819 Personal history of malignant neoplasm of unspecified site of lip, oral cavity, and pharynx: Secondary | ICD-10-CM | POA: Diagnosis not present

## 2021-02-28 DIAGNOSIS — I10 Essential (primary) hypertension: Secondary | ICD-10-CM | POA: Diagnosis not present

## 2021-02-28 DIAGNOSIS — E871 Hypo-osmolality and hyponatremia: Secondary | ICD-10-CM

## 2021-02-28 DIAGNOSIS — J3089 Other allergic rhinitis: Secondary | ICD-10-CM | POA: Diagnosis not present

## 2021-02-28 DIAGNOSIS — J690 Pneumonitis due to inhalation of food and vomit: Secondary | ICD-10-CM

## 2021-02-28 DIAGNOSIS — R1312 Dysphagia, oropharyngeal phase: Secondary | ICD-10-CM | POA: Diagnosis not present

## 2021-02-28 MED ORDER — AMLODIPINE BESYLATE 5 MG PO TABS: 5.0000 mg | ORAL_TABLET | Freq: Every evening | ORAL | 3 refills | Status: DC

## 2021-02-28 MED ORDER — FLUTICASONE PROPIONATE 50 MCG/ACT NA SUSP: NASAL | 1 refills | Status: DC

## 2021-02-28 NOTE — Progress Notes (Signed)
Subjective:    Patient ID: Lee Barnes, male    DOB: [birth year], 70 y.o.   MRN: [id number]  HPI He is here for recheck after recent hospitalization and treatment for pneumonia.  This was a second admission for pneumonia.  He has a previous history of oropharyngeal cancer and subsequent radiation therapy and has had difficulty since that. He also states that he is now noticing a feeling of food getting stuck lower down his esophagus that occasionally he has to vomit back up.  This does occur more with solids he also has a history of ongoing hyponatremia.  Review of Systems     Objective:   Physical Exam  Alert and in no distress otherwise not examined      Assessment & Plan:  Immunization, viral disease - Plan: Moderna Covid-19 Booster  Essential hypertension - Plan: Comprehensive metabolic panel, amLODipine (NORVASC) 5 MG tablet  Other allergic rhinitis - Plan: fluticasone (FLONASE) 50 MCG/ACT nasal spray  Hyponatremia - Plan: Comprehensive metabolic panel  Aspiration pneumonia of right lower lobe due to gastric secretions (HCC) - Plan: SLP modified barium swallow, Ambulatory referral to Gastroenterology  Oropharyngeal dysphagia - Plan: Ambulatory referral to Gastroenterology  History of oropharyngeal cancer  After review of his records and seeing 2 episodes of aspiration pneumonia, getting a swallowing study at this point think is reasonable and then make adjustments based on that.  He also complains of solid food getting stuck further down his esophagus on occasion.  This could indicate further damage from the radiation.  I think it is reasonable to look at both of these to her fully assess what is going on.  He was comfortable with this.  This was also discussed with his wife.

## 2021-03-01 LAB — COMPREHENSIVE METABOLIC PANEL
ALT: 11 IU/L (ref 0–44)
AST: 18 IU/L (ref 0–40)
Albumin/Globulin Ratio: 1.3 (ref 1.2–2.2)
Albumin: 4.6 g/dL (ref 3.8–4.8)
Alkaline Phosphatase: 114 IU/L (ref 44–121)
BUN/Creatinine Ratio: 10 (ref 10–24)
BUN: 11 mg/dL (ref 8–27)
Bilirubin Total: 0.3 mg/dL (ref 0.0–1.2)
CO2: 22 mmol/L (ref 20–29)
Calcium: 9.4 mg/dL (ref 8.6–10.2)
Chloride: 94 mmol/L — ABNORMAL LOW (ref 96–106)
Creatinine, Ser: 1.09 mg/dL (ref 0.76–1.27)
Globulin, Total: 3.5 g/dL (ref 1.5–4.5)
Glucose: 88 mg/dL (ref 65–99)
Potassium: 5.4 mmol/L — ABNORMAL HIGH (ref 3.5–5.2)
Sodium: 133 mmol/L — ABNORMAL LOW (ref 134–144)
Total Protein: 8.1 g/dL (ref 6.0–8.5)
eGFR: 73 mL/min/{1.73_m2} (ref >59)

## 2021-03-02 ENCOUNTER — Other Ambulatory Visit (HOSPITAL_COMMUNITY): Payer: Self-pay | Admitting: *Deleted

## 2021-03-02 DIAGNOSIS — R1310 Dysphagia, unspecified: Secondary | ICD-10-CM

## 2021-03-07 ENCOUNTER — Telehealth: Payer: Self-pay

## 2021-03-07 ENCOUNTER — Other Ambulatory Visit: Payer: Self-pay

## 2021-03-07 ENCOUNTER — Ambulatory Visit (HOSPITAL_COMMUNITY)
Admission: RE | Admit: 2021-03-07 | Discharge: 2021-03-07 | Disposition: A | Discharge status: Home / Self Care | Payer: Medicare Other | Source: Ambulatory Visit | Attending: Family Medicine | Admitting: Family Medicine

## 2021-03-07 DIAGNOSIS — R1310 Dysphagia, unspecified: Secondary | ICD-10-CM

## 2021-03-07 DIAGNOSIS — J690 Pneumonitis due to inhalation of food and vomit: Secondary | ICD-10-CM | POA: Insufficient documentation

## 2021-03-07 DIAGNOSIS — T17320A Food in larynx causing asphyxiation, initial encounter: Secondary | ICD-10-CM | POA: Diagnosis not present

## 2021-03-07 NOTE — Telephone Encounter (Signed)
Patient wife called stating she is on the way to take patient to swallow study but has concerns of whether patient needs to have chest x-ray or another imaging done as she has concerns that he might have pre aspiration pneumonia. Stated patient spit out dark color phlegm same as when he previously has pneumonia.   Please advise.

## 2021-03-07 NOTE — Therapy (Signed)
Modified Barium Swallow Progress Note  Patient Details  Name: Lee Barnes MRN: [id number] Date of Birth: Mar 23, 1951  Today's Date: 03/07/2021  Modified Barium Swallow completed.  Full report located under Chart Review in the Imaging Section.  Brief recommendations include the following:  Clinical Impression  Pt presents with a moderate oropharyngeal latent radiation associated dysphagia, chronic in nature, in setting of previously treated BOT cancer s/p XRT and chemotherapy 2012. Pt with osteoradionectrosis s/p mandibular surgery 2019 and 2021. Pt has had worsening dypshagia and recurrent PNA.   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 Pt with reduced right velar clossure consistent with velopharyngeal insufficiency and associated hypernasality. He states at times liquid escape to nasal passages. Pt with reduced bolus cohesion and delayed AP transit across boluses.   Global pharyngeal deficits included decreased BOT retraction, decreased laryngeal elevation, reduced anterior hyoid excursion, poor UES opening, reduced epigglotic deflection, decreased glottic closure, and diminished sensation. This allowed for consisent pre, during, and post swallow laryngeal penetration with all liquids (thin, nectar thick, and honey thick liquids); inconsistently sensed. Cues for hard throat clear and or cough with volitional swallow assisted to clear penetrates and trace aspirates that occured post swallow with thin liquids. Pt with consistent residuals in the valleculae and pyriform sinus residuals (mild to moderate amounts) that worsed with thicker viscosities. Multiple swallows and thin liquid alternation assisted to decrease residuals however did not completely clear. Educated pt and patients spouse extensively on nature of radiation associated dysphagia, swallowing exercises, recommendation for initiation of respiratory muscle strength training (via  EMST-lite) and importance of oral care TID. Information was given in written form and verbally including teach back. Recommend outpatient SLP follow up for dysphagia intervention.   Swallow Evaluation Recommendations       SLP Diet Recommendations: Thin liquid;Regular solids   Liquid Administration via: Cup;No straw   Medication Administration: Crushed with puree       Compensations: Minimize environmental distractions;Slow rate;Small sips/bites;Clear throat after each swallow;Hard cough after swallow;Effortful swallow;Multiple dry swallows after each bite/sip   Postural Changes: Remain semi-upright after after feeds/meals (Comment);Seated upright at 90 degrees   Oral Care Recommendations: Oral care before and after PO        [name] MA, CCC-SLP 03/07/2021,12:47 PM

## 2021-03-07 NOTE — Telephone Encounter (Signed)
See if he is having any other related symptoms and if so get an x-ray.  His wife is good with that stuff

## 2021-03-07 NOTE — Telephone Encounter (Signed)
She has scheduled for him to come in and be seen with you tomorrow morning. Is that ok?

## 2021-03-08 ENCOUNTER — Ambulatory Visit (INDEPENDENT_AMBULATORY_CARE_PROVIDER_SITE_OTHER): Payer: Medicare Other | Admitting: Family Medicine

## 2021-03-08 ENCOUNTER — Encounter: Payer: Self-pay | Admitting: Family Medicine

## 2021-03-08 VITALS — BP 172/100 | HR 89 | Temp 97.3°F | Wt 164.2 lb

## 2021-03-08 DIAGNOSIS — R1312 Dysphagia, oropharyngeal phase: Secondary | ICD-10-CM | POA: Diagnosis not present

## 2021-03-08 DIAGNOSIS — Z8701 Personal history of pneumonia (recurrent): Secondary | ICD-10-CM | POA: Diagnosis not present

## 2021-03-08 NOTE — Progress Notes (Signed)
Subjective:    Patient ID: Lee Barnes, male    DOB: 27-Jan-1951, 70 y.o.   MRN: [id number]  HPI He is here for recheck.  He recently had a swallowing study done.  He is here to discuss this and also potential for further aspiration.  He notes that in the past, when he would note blood-tinged sputum, he would then get fever and chills as well as fatigue.  He also has a previous history of difficulty with hyponatremia.  This hall has him very sensitized to any pulmonary symptoms.   Review of Systems     Objective:   Physical Exam Alert and in no distress.  Cardiac exam shows regular rhythm without murmurs or gallops.  Lungs are clear to auscultation. The swallowing study was reviewed with him in detail.       Assessment & Plan:  Oropharyngeal dysphagia - Plan: Ambulatory referral to Speech Therapy  History of aspiration pneumonia I reviewed the sliding study with him in detail. We discussed the onset of seeing blood-tinged sputum, fever, chills, malaise and fatigue and need for immediate follow-up.  In general when he gets this point he will need to go to the emergency room.  We discussed also the possibility of coming in here based on his symptoms.  He is Comfortable with all these recommendations. Referral also made to speech therapy to get their input into his speech and swallowing. Greater than 30 minutes spent in reviewing his medical record evaluation and treatment.

## 2021-03-19 ENCOUNTER — Ambulatory Visit: Payer: Medicare Other | Admitting: Speech Pathology

## 2021-03-21 ENCOUNTER — Encounter: Payer: Self-pay | Admitting: Speech Pathology

## 2021-03-21 ENCOUNTER — Ambulatory Visit: Payer: Medicare Other | Attending: Family Medicine | Admitting: Speech Pathology

## 2021-03-21 ENCOUNTER — Other Ambulatory Visit: Payer: Self-pay

## 2021-03-21 DIAGNOSIS — R1312 Dysphagia, oropharyngeal phase: Secondary | ICD-10-CM | POA: Insufficient documentation

## 2021-03-21 NOTE — Patient Instructions (Addendum)
   Blue = Blow  5 sets of 5 reps - use nose clips and support your jaw/mouth as needed to get a good lip  - 3x a day for a total of 75 a day (as possible)  Some dizziness will be normal - take breaks in between repetitions as needed  Swallow precuations:  Eliminate distractions with meals Take small bites and sips Hard swallow Cough after each swallow Do 2-3 more dry swallows  Eat fully upright  Oral care is extremely important to keep bacteria out of your lungs - floss and brush after meals  SWALLOWING EXERCISES 1. Effortful Swallows - Squeeze hard with the muscles in your neck while you swallow your  saliva or a sip of water - Repeat 20 times, 2-3 times a day, and use whenever you eat or drink  2. Masako Swallow - swallow with your tongue sticking out - Stick tongue out and gently bite tongue with your teeth - Swallow, while holding your tongue with your teeth - Repeat 20 times, 2-3 times a day  3. Shaker Exercise - head lift - Lie flat on your back in your bed or on a couch without pillows - Raise your head and look at your feet  - KEEP YOUR SHOULDERS DOWN - HOLD FOR 45 SECONDS, then lower your head back down - Repeat 3 times, 2-3 times a day  4. Mendelsohn Maneuver - "half swallow" exercise - Start to swallow, and keep your Adam's apple up by squeezing hard with the muscles of the throat - Hold the squeeze for 5-7 seconds and then relax - Repeat 20 times, 2-3 times a day  5. Tongue Press - Press your entire tongue as hard as you can against the roof of your mouth for 3-5 seconds - Repeat 20 times, 2-3 times a day  6. Tongue Stretch/Teeth Clean - Move your tongue around the pocket between your gums and teeth, clockwise and then counter-clockwise - Repeat on the back side, clockwise and then counter-clockwise - Repeat 15-20 times, 2-3 times a day  7. Breath Hold - Say "HUH!" loudly, holding your breath tightly at the level of your voice box for 3 seconds - Repeat  20 times, 2-3 times a day  8. Chin tuck against resistant - Open your mouth  - Place a softer ball or rolled up hand towel UNDER your chin near your neck, and flex your neck to hold the towel in place for 45 seconds  - Repeat 10 times, 2-3 times a day   11. Stick out your tongue and say "GA-GA-GA" loud and shart           -Repeat 25 sets of 3, 2-3 times a day  12. Say ING-GA loud and exaggerated             - 25 times 2-3 times a day

## 2021-03-21 NOTE — Therapy (Signed)
Weeks    Status New      SLP LONG TERM GOAL #2   Title Pt will complete HEP for dysphagia with mod I over 2 sessions    Time 8    Period Weeks    Status New      SLP LONG TERM GOAL #3   Title Pt will demonstrate improved airway protection, glottal closure by not contracting aspiration pna over 8 weeks.    Time 8    Period Weeks    Status New            Plan - 03/21/21 1223    Clinical Impression Statement [name] is referred for outpt ST due to moderate oropharyngeal dysphagia with 2 courses of aspiration pna which required hospitalization. MBSS revealed global pharyngeal impairments, resulting in consistent penetration with all liquid consistencies and consistent pharyngeal residue after all consistencies. Today, I measured Maximum Inspiratory Pressure (MIP) and Maximum Expiratory Pressure (MEP) for respiratory muscle training. Lee Barnes's MIP was 41 cmH2O), which is 70% of predicted MIP for his age. MEP was 89 cm H2O, which is 75% of predicted MEP for his age. Established 50   on EMT 150 (Aspire) device was an effort of 6-7/10, which is 56% of his MEP. Established 30 on Threshold IMT ([employer]) as 7/10 effort, which is 70% of his MIP. He was trained on use of these devices, set to 50 and 30 and demonstrated accurate use of trainers with occasional min A, instruction and modeling. Initiated 3 exercises for HEP briefly. Lee Barnes was familiar with these from prior course of therapy. He required mod questioning cues to verbalize his swallowing precautions. I recommed skilled ST to maximize safety of swallow and reduce risk of hospitalization from aspiration pna.    Speech Therapy Frequency 2x / week    Duration 8 weeks   17 visits   Treatment/Interventions Aspiration precaution training;Pharyngeal strengthening exercises;Diet toleration management by SLP;Trials of upgraded texture/liquids;Internal/external aids;Patient/family education;Compensatory  strategies;Oral motor exercises;Environmental controls;SLP instruction and feedback;Other (comment)    Potential to Achieve Goals Fair    Potential Considerations Co-morbidities;Previous level of function;Severity of impairments           Patient will benefit from skilled therapeutic intervention in order to improve the following deficits and impairments:   Dysphagia, oropharyngeal phase    Problem List Patient Active Problem List   Diagnosis Date Noted  . Aspiration pneumonia ([place]) 02/20/2021  . SIRS (systemic inflammatory response syndrome) ([place]) 02/20/2021  . Chronic hyponatremia 02/20/2021  . Sepsis ([place]) 02/20/2021  . Aspiration pneumonia of both lower lobes due to gastric secretions ([place]) 12/12/2020  . Dysphagia 12/12/2020  . 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 ACE-inhibitor cough 05/25/2015  . Essential hypertension 03/21/2015  . Allergic rhinitis 12/14/2013  . ED (erectile dysfunction) 12/14/2013  . Diverticulosis   . History of oropharyngeal cancer 09/18/2010    Renette Hsu, [place], CCC-SLP 03/21/2021, 1:39 PM  [facility] [address] [place], [place], [address] Phone: [phone]   Fax:  [phone]  Name: Lee Barnes MRN: [id number] Date of Birth: May 22, 1951  [facility] [address] [place], [place], [address] Phone: [phone]   Fax:  [phone]  Speech Language Pathology Evaluation  Patient Details  Name: Lee Barnes MRN: [id number] Date of Birth: [birth year] Referring Provider (SLP): Dr. Jill Alexanders   Encounter Date: 03/21/2021   End of Session - 03/21/21 1214    Visit Number 1    Number of Visits 17    Date for SLP Re-Evaluation 05/16/21    Authorization Type none    SLP Start Time 1018    SLP Stop Time  1101    SLP Time Calculation (min) 43 min    Activity Tolerance Patient tolerated treatment well           Past Medical History:  Diagnosis Date  . Allergy to environmental factors   . Complication of anesthesia    "vageled" after surgery -overnite stay  . Diverticulosis 2008  . Heart murmur    hx of in childhood   . History of chemotherapy   . History of radiation therapy 11/05/10-12/26/10   r base tongue, 7000 cGy 35 sessions  . Hypertension    Medication started in May 2016.  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 Pneumonia    hx of 2012  . 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 Tinnitus     Past Surgical History:  Procedure Laterality Date  . APPENDECTOMY    . COLONOSCOPY    . INGUINAL HERNIA REPAIR Bilateral 03/30/2015   Procedure: LAPAROSCOPIC BILATERAL INGUINAL HERNIA REPAIR WITH MESH;  Surgeon: Coralie Keens, MD;  Location: [facility];  Service: General;  Laterality: Bilateral;  . INGUINAL HERNIA REPAIR Right 1960  . INGUINAL HERNIA REPAIR Bilateral 03/30/2015  . INGUINAL HERNIA REPAIR Left 07/11/2015  . INGUINAL HERNIA REPAIR N/A 07/11/2015   Procedure: REPAIR OF LEFT INGUINAL HERNIA WITH MESH;  Surgeon: Coralie Keens, MD;  Location: [facility];  Service: General;  Laterality: N/A;  . INSERTION OF MESH N/A 07/11/2015   Procedure: INSERTION OF MESH;  Surgeon: Coralie Keens, MD;  Location: [facility];  Service: General;  Laterality:  N/A;  . LUMBAR [place]    . TONSILLECTOMY      There were no vitals filed for this visit.       SLP Evaluation [facility] - 03/21/21 1015      SLP Visit Information   SLP Received On 03/21/21    Referring Provider (SLP) Dr. Jill Alexanders    Onset Date 2012 - late effects of radiation    Medical Diagnosis Oropharyngeal dysphagia      Subjective   Subjective "I've had aspiration pna 2x in the 1st 4 months of this year"    Patient/Family Stated Goal So I can make sure what I'm doing is correct and get the most gain to improve it"      General Information   HPI 70 year old male with PMH + SCCA base of tongue diagnosed in 2011 with XRT and chemo treatment into 2011-May 2012.  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  Pt reports since this time he has had vocal hoarseness and some increasing dysphagia.  He had required a PEG during cancer treatements.  Pt had pna in January 2022 and now in April 2022. MBSS   Pt presents with a moderate oropharyngeal latent radiation associated dysphagia, chronic in nature, in setting of previously treated BOT cancer s/p XRT  [facility] [address] [place], [place], [address] Phone: [phone]   Fax:  [phone]  Speech Language Pathology Evaluation  Patient Details  Name: Lee Barnes MRN: [id number] Date of Birth: [birth year] Referring Provider (SLP): Dr. Jill Alexanders   Encounter Date: 03/21/2021   End of Session - 03/21/21 1214    Visit Number 1    Number of Visits 17    Date for SLP Re-Evaluation 05/16/21    Authorization Type none    SLP Start Time 1018    SLP Stop Time  1101    SLP Time Calculation (min) 43 min    Activity Tolerance Patient tolerated treatment well           Past Medical History:  Diagnosis Date  . Allergy to environmental factors   . Complication of anesthesia    "vageled" after surgery -overnite stay  . Diverticulosis 2008  . Heart murmur    hx of in childhood   . History of chemotherapy   . History of radiation therapy 11/05/10-12/26/10   r base tongue, 7000 cGy 35 sessions  . Hypertension    Medication started in May 2016.  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 Pneumonia    hx of 2012  . 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 Tinnitus     Past Surgical History:  Procedure Laterality Date  . APPENDECTOMY    . COLONOSCOPY    . INGUINAL HERNIA REPAIR Bilateral 03/30/2015   Procedure: LAPAROSCOPIC BILATERAL INGUINAL HERNIA REPAIR WITH MESH;  Surgeon: Coralie Keens, MD;  [facility];  Service: General;  Laterality: Bilateral;  . INGUINAL HERNIA REPAIR Right 1960  . INGUINAL HERNIA REPAIR Bilateral 03/30/2015  . INGUINAL HERNIA REPAIR Left 07/11/2015  . INGUINAL HERNIA REPAIR N/A 07/11/2015   Procedure: REPAIR OF LEFT INGUINAL HERNIA WITH MESH;  Surgeon: Coralie Keens, MD;  Location: [facility];  Service: General;  Laterality: N/A;  . INSERTION OF MESH N/A 07/11/2015   Procedure: INSERTION OF MESH;  Surgeon: Coralie Keens, MD;  Location: [facility];  Service: General;  Laterality:  N/A;  . LUMBAR [place]    . TONSILLECTOMY      There were no vitals filed for this visit.       SLP Evaluation [facility] - 03/21/21 1015      SLP Visit Information   SLP Received On 03/21/21    Referring Provider (SLP) Dr. Jill Alexanders    Onset Date 2012 - late effects of radiation    Medical Diagnosis Oropharyngeal dysphagia      Subjective   Subjective "I've had aspiration pna 2x in the 1st 4 months of this year"    Patient/Family Stated Goal So I can make sure what I'm doing is correct and get the most gain to improve it"      General Information   HPI 70 year old male with PMH + SCCA base of tongue diagnosed in 2011 with XRT and chemo treatment into 2011-May 2012.  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  Pt reports since this time he has had vocal hoarseness and some increasing dysphagia.  He had required a PEG during cancer treatements.  Pt had pna in January 2022 and now in April 2022. MBSS   Pt presents with a moderate oropharyngeal latent radiation associated dysphagia, chronic in nature, in setting of previously treated BOT cancer s/p XRT

## 2021-03-26 ENCOUNTER — Ambulatory Visit: Payer: Medicare Other | Admitting: Speech Pathology

## 2021-03-26 ENCOUNTER — Encounter: Payer: Self-pay | Admitting: Speech Pathology

## 2021-03-26 ENCOUNTER — Other Ambulatory Visit: Payer: Self-pay

## 2021-03-26 DIAGNOSIS — R1312 Dysphagia, oropharyngeal phase: Secondary | ICD-10-CM | POA: Diagnosis not present

## 2021-03-26 NOTE — Therapy (Signed)
Problem List Patient Active Problem List   Diagnosis Date Noted  . Aspiration pneumonia ([place]) 02/20/2021  . SIRS (systemic inflammatory response syndrome) ([place]) 02/20/2021  . Chronic hyponatremia 02/20/2021  . Sepsis ([place]) 02/20/2021  . Aspiration pneumonia of both lower lobes due to gastric secretions ([place]) 12/12/2020  . Dysphagia 12/12/2020  . 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 ACE-inhibitor cough 05/25/2015  . Essential hypertension 03/21/2015  . Allergic rhinitis 12/14/2013  . ED (erectile dysfunction) 12/14/2013  . Diverticulosis   . History of oropharyngeal cancer 09/18/2010    Dayane Hillenburg, Annye Rusk MS, CCC-SLP 03/26/2021, 1:36 PM  [facility] [address] [place], [place], [address] Phone: [phone]   Fax:  [phone]   Name: Lee Barnes MRN: [id number] Date of Birth: Mar 13, 1951  [facility] [address] [place], [place], [address] Phone: [phone]   Fax:  [phone]  Speech Language Pathology Treatment  Patient Details  Name: Lee Barnes MRN: [id number] Date of Birth: 1951/08/14 Referring Provider (SLP): Dr. Jill Alexanders   Encounter Date: 03/26/2021   End of Session - 03/26/21 1335    Visit Number 2    Number of Visits 17    Date for SLP Re-Evaluation 05/16/21    Authorization Type none    SLP Start Time 1147    SLP Stop Time  1220    SLP Time Calculation (min) 33 min    Activity Tolerance Patient tolerated treatment well           Past Medical History:  Diagnosis Date  . Allergy to environmental factors   . Complication of anesthesia    "vageled" after surgery -overnite stay  . Diverticulosis 2008  . Heart murmur    hx of in childhood   . History of chemotherapy   . History of radiation therapy 11/05/10-12/26/10   r base tongue, 7000 cGy 35 sessions  . Hypertension    Medication started in May 2016.  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 Pneumonia    hx of 2012  . 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 Tinnitus     Past Surgical History:  Procedure Laterality Date  . APPENDECTOMY    . COLONOSCOPY    . INGUINAL HERNIA REPAIR Bilateral 03/30/2015   Procedure: LAPAROSCOPIC BILATERAL INGUINAL HERNIA REPAIR WITH MESH;  Surgeon: Coralie Keens, MD;  [facility];  Service: General;  Laterality: Bilateral;  . INGUINAL HERNIA REPAIR Right 1960  . INGUINAL HERNIA REPAIR Bilateral 03/30/2015  . INGUINAL HERNIA REPAIR Left 07/11/2015  . INGUINAL HERNIA REPAIR N/A 07/11/2015   Procedure: REPAIR OF LEFT INGUINAL HERNIA WITH MESH;  Surgeon: Coralie Keens, MD;  Location: [facility];  Service: General;  Laterality: N/A;  . INSERTION OF MESH N/A 07/11/2015   Procedure: INSERTION OF MESH;  Surgeon: Coralie Keens, MD;  Location: [facility];  Service: General;  Laterality:  N/A;  . LUMBAR [place]    . TONSILLECTOMY      There were no vitals filed for this visit.   Subjective Assessment - 03/26/21 1152    Subjective "I wasn't sure what to do with these" re: HEP sheet I sent home    Currently in Pain? No/denies                 ADULT SLP TREATMENT - 03/26/21 1152      General Information   Behavior/Cognition Alert;Cooperative;Pleasant mood      Treatment Provided   Treatment provided Dysphagia      Dysphagia Treatment   Temperature Spikes Noted No    Respiratory Status Room air    Oral Cavity - Dentition Adequate natural dentition    Treatment Methods Therapeutic exercise;Compensation strategy training;Patient/caregiver education    Patient observed directly with PO's Yes    Type of PO's observed Regular;Thin liquids    Feeding Able to feed self    Liquids provided via Cup    Pharyngeal Phase Signs & Symptoms Immediate cough;Delayed cough;Immediate throat clear;Delayed throat clear;Wet vocal quality    Type of cueing Verbal    Amount of cueing Minimal    Other treatment/comments [name] required occasional min verbal cues to follow swallow protocol consistently. Reviewed 3 exercises for HEP (Mendelson, effortful swallow, CTAR)  [facility] [address] [place], [place], [address] Phone: [phone]   Fax:  [phone]  Speech Language Pathology Treatment  Patient Details  Name: Lee Barnes MRN: [id number] Date of Birth: 1951/08/14 Referring Provider (SLP): Dr. Jill Alexanders   Encounter Date: 03/26/2021   End of Session - 03/26/21 1335    Visit Number 2    Number of Visits 17    Date for SLP Re-Evaluation 05/16/21    Authorization Type none    SLP Start Time 1147    SLP Stop Time  1220    SLP Time Calculation (min) 33 min    Activity Tolerance Patient tolerated treatment well           Past Medical History:  Diagnosis Date  . Allergy to environmental factors   . Complication of anesthesia    "vageled" after surgery -overnite stay  . Diverticulosis 2008  . Heart murmur    hx of in childhood   . History of chemotherapy   . History of radiation therapy 11/05/10-12/26/10   r base tongue, 7000 cGy 35 sessions  . Hypertension    Medication started in May 2016.  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 Pneumonia    hx of 2012  . 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 Tinnitus     Past Surgical History:  Procedure Laterality Date  . APPENDECTOMY    . COLONOSCOPY    . INGUINAL HERNIA REPAIR Bilateral 03/30/2015   Procedure: LAPAROSCOPIC BILATERAL INGUINAL HERNIA REPAIR WITH MESH;  Surgeon: Coralie Keens, MD;  [facility];  Service: General;  Laterality: Bilateral;  . INGUINAL HERNIA REPAIR Right 1960  . INGUINAL HERNIA REPAIR Bilateral 03/30/2015  . INGUINAL HERNIA REPAIR Left 07/11/2015  . INGUINAL HERNIA REPAIR N/A 07/11/2015   Procedure: REPAIR OF LEFT INGUINAL HERNIA WITH MESH;  Surgeon: Coralie Keens, MD;  Location: [facility];  Service: General;  Laterality: N/A;  . INSERTION OF MESH N/A 07/11/2015   Procedure: INSERTION OF MESH;  Surgeon: Coralie Keens, MD;  Location: [facility];  Service: General;  Laterality:  N/A;  . LUMBAR [place]    . TONSILLECTOMY      There were no vitals filed for this visit.   Subjective Assessment - 03/26/21 1152    Subjective "I wasn't sure what to do with these" re: HEP sheet I sent home    Currently in Pain? No/denies                 ADULT SLP TREATMENT - 03/26/21 1152      General Information   Behavior/Cognition Alert;Cooperative;Pleasant mood      Treatment Provided   Treatment provided Dysphagia      Dysphagia Treatment   Temperature Spikes Noted No    Respiratory Status Room air    Oral Cavity - Dentition Adequate natural dentition    Treatment Methods Therapeutic exercise;Compensation strategy training;Patient/caregiver education    Patient observed directly with PO's Yes    Type of PO's observed Regular;Thin liquids    Feeding Able to feed self    Liquids provided via Cup    Pharyngeal Phase Signs & Symptoms Immediate cough;Delayed cough;Immediate throat clear;Delayed throat clear;Wet vocal quality    Type of cueing Verbal    Amount of cueing Minimal    Other treatment/comments [name] required occasional min verbal cues to follow swallow protocol consistently. Reviewed 3 exercises for HEP (Mendelson, effortful swallow, CTAR)

## 2021-03-26 NOTE — Patient Instructions (Signed)
    Signs of Aspiration Pneumonia   . Chest pain/tightness . Fever (can be low grade) . Cough  o With foul-smelling phlegm (sputum) o With sputum containing pus or blood o With greenish sputum . Fatigue  . Shortness of breath  . Wheezing   **IF YOU HAVE THESE SIGNS, CONTACT YOUR DOCTOR OR GO TO THE EMERGENCY DEPARTMENT OR URGENT CARE AS SOON AS POSSIBLE**    You can find ways to work some of the exercises into your day, for example in the car, during commercials, while walking  Walking briskly to get your heart and respiratory rate up a little can help clear out your lungs as well.

## 2021-03-28 ENCOUNTER — Other Ambulatory Visit: Payer: Self-pay

## 2021-03-28 ENCOUNTER — Encounter: Payer: Self-pay | Admitting: Speech Pathology

## 2021-03-28 ENCOUNTER — Ambulatory Visit: Payer: Medicare Other | Admitting: Speech Pathology

## 2021-03-28 DIAGNOSIS — R1312 Dysphagia, oropharyngeal phase: Secondary | ICD-10-CM | POA: Diagnosis not present

## 2021-03-28 NOTE — Therapy (Signed)
[facility] [address] [place], [place], [address] Phone: [phone]   Fax:  [phone]  Speech Language Pathology Treatment  Patient Details  Name: VIDAL KIENITZ MRN: [id number] Date of Birth: [birth year] Referring Provider (SLP): Dr. Sharlot Gowda   Encounter Date: 03/28/2021   End of Session - 03/28/21 1054    Visit Number 3    Number of Visits 17    Date for SLP Re-Evaluation 05/16/21    SLP Start Time 1016    SLP Stop Time  1047    SLP Time Calculation (min) 31 min           Past Medical History:  Diagnosis Date  . Allergy to environmental factors   . Complication of anesthesia    "vageled" after surgery -overnite stay  . Diverticulosis 2008  . Heart murmur    hx of in childhood   . History of chemotherapy   . History of radiation therapy 11/05/10-12/26/10   r base tongue, 7000 cGy 35 sessions  . Hypertension    Medication started in May 2016.  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 Pneumonia    hx of 2012  . 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 Tinnitus     Past Surgical History:  Procedure Laterality Date  . APPENDECTOMY    . COLONOSCOPY    . INGUINAL HERNIA REPAIR Bilateral 03/30/2015   Procedure: LAPAROSCOPIC BILATERAL INGUINAL HERNIA REPAIR WITH MESH;  Surgeon: Abigail Miyamoto, MD;  [facility];  Service: General;  Laterality: Bilateral;  . INGUINAL HERNIA REPAIR Right 1960  . INGUINAL HERNIA REPAIR Bilateral 03/30/2015  . INGUINAL HERNIA REPAIR Left 07/11/2015  . INGUINAL HERNIA REPAIR N/A 07/11/2015   Procedure: REPAIR OF LEFT INGUINAL HERNIA WITH MESH;  Surgeon: Abigail Miyamoto, MD;  Location: [facility];  Service: General;  Laterality: N/A;  . INSERTION OF MESH N/A 07/11/2015   Procedure: INSERTION OF MESH;  Surgeon: Abigail Miyamoto, MD;  [facility];  Service: General;  Laterality: N/A;  . LUMBAR [facility] SURGERY    . TONSILLECTOMY      There were no vitals filed  for this visit.   Subjective Assessment - 03/28/21 1021    Subjective "Good"    Currently in Pain? No/denies                 ADULT SLP TREATMENT - 03/28/21 1021      General Information   Behavior/Cognition Alert;Cooperative;Pleasant mood      Treatment Provided   Treatment provided Dysphagia      Dysphagia Treatment   Temperature Spikes Noted No    Respiratory Status Room air    Oral Cavity - Dentition Adequate natural dentition    Treatment Methods Therapeutic exercise;Compensation strategy training;Patient/caregiver education    Patient observed directly with PO's Yes    Type of PO's observed Thin liquids    Feeding Able to feed self    Liquids provided via Cup    Pharyngeal Phase Signs & Symptoms Immediate cough;Delayed cough;Immediate throat clear;Delayed throat clear;Wet vocal quality   Swallow precautions call for immediate cough after swallow   Type of cueing Verbal    Amount of cueing Minimal    Other treatment/comments Ongoing  training for HEP.added masako.  [name] completed HEP with rare min A, except for Mendelson, which was occasional min A initially. RMT with      Assessment / Recommendations / Plan   Plan Continue with  current plan of care      Dysphagia Recommendations   Diet recommendations Regular;Thin liquid    Liquids provided via Cup    Medication Administration Whole meds with liquid    Supervision Patient able to self feed    Compensations Slow rate;Small sips/bites;Multiple dry swallows after each bite/sip;Hard cough after swallow;Effortful swallow    Postural Changes and/or Swallow Maneuvers Out of bed for meals;Seated upright 90 degrees;Upright 30-60 min after meal   limit distractions with meals, no talking with meals           SLP Education - 03/28/21 1051    Education Details HEP, swallow precuations;    Person(s) Educated Patient    Methods Explanation;Demonstration;Verbal cues;Handout    Comprehension Verbalized  understanding;Returned demonstration;Verbal cues required            SLP Short Term Goals - 03/28/21 1053      SLP SHORT TERM GOAL #1   Title Pt will follow swallow precautions with rare min A over 2 sessions    Time 4    Period Weeks    Status On-going      SLP SHORT TERM GOAL #2   Title Pt will complete HEP for dysphagia with rare min A over 2 sessions    Time 4    Period Weeks    Status On-going      SLP SHORT TERM GOAL #3   Title Pt will complete RMT HEP of 3 sets of 25 repetitions of IMT and EMT at 60-70% of MIP and MEP 5/7 days for 4 weeks    Time 4    Period Weeks    Status On-going            SLP Long Term Goals - 03/28/21 1054      SLP LONG TERM GOAL #1   Title Pt will follow swallow precautions with mod I over 2 sessoins    Time 8    Period Weeks    Status On-going      SLP LONG TERM GOAL #2   Title Pt will complete HEP for dysphagia with mod I over 2 sessions    Time 8    Period Weeks    Status On-going      SLP LONG TERM GOAL #3   Title Pt will demonstrate improved airway protection, glottal closure by not contracting aspiration pna over 8 weeks.    Time 8    Period Weeks    Status On-going            Plan - 03/28/21 1052    Clinical Impression Statement [name] is demonstrating accurate completion of both EMT and IMT with rare min A, and clarification of his questions. He is completing 5 sets of 5 reps 2-3x a day. Training in swallow precautions initiated with min A. He is verbalizing and following swallow precautions with min A. Continue skilled ST to improve airway protection and reduce risk of aspiration pna.    Speech Therapy Frequency 2x / week    Duration 8 weeks   17 visits   Treatment/Interventions Aspiration precaution training;Pharyngeal strengthening exercises;Diet toleration management by SLP;Trials of upgraded texture/liquids;Internal/external aids;Patient/family education;Compensatory strategies;Oral motor exercises;Environmental  controls;SLP instruction and feedback;Other (comment)    Potential to Achieve Goals Fair    Potential Considerations Co-morbidities;Previous level of function;Severity of impairments           Patient will benefit from skilled therapeutic intervention in order to improve the following deficits and impairments:  Dysphagia, oropharyngeal phase    Problem List Patient Active Problem List   Diagnosis Date Noted  . Aspiration pneumonia ([facility]) 02/20/2021  . SIRS (systemic inflammatory response syndrome) ([facility]) 02/20/2021  . Chronic hyponatremia 02/20/2021  . Sepsis ([facility]) 02/20/2021  . Aspiration pneumonia of both lower lobes due to gastric secretions ([facility]) 12/12/2020  . Dysphagia 12/12/2020  . 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 ACE-inhibitor cough 05/25/2015  . Essential hypertension 03/21/2015  . Allergic rhinitis 12/14/2013  . ED (erectile dysfunction) 12/14/2013  . Diverticulosis   . History of oropharyngeal cancer 09/18/2010    Thoma Paulsen, Radene Journey MS, CCC-SLP 03/28/2021, 10:54 AM  [facility] [address] [place], [place], [address] Phone: [phone]   Fax:  [phone]   Name: OLUWASEYI PASQUARIELLO MRN: [id number] Date of Birth: 07-07-1951

## 2021-03-28 NOTE — Patient Instructions (Signed)
   Add Masako to exercises - you may have to break this up and do 5-7 if needed  Take sips of water as needed, however, don't stick your tongue out and swallow with water in your mouth - this one needs to be done as a dry swallow  If you have a 4" ball, use that for the chin tuck exercise, or a 4" pool noodle will work as well

## 2021-04-02 ENCOUNTER — Other Ambulatory Visit: Payer: Self-pay

## 2021-04-02 ENCOUNTER — Encounter: Payer: Self-pay | Admitting: Family Medicine

## 2021-04-02 ENCOUNTER — Ambulatory Visit (INDEPENDENT_AMBULATORY_CARE_PROVIDER_SITE_OTHER): Payer: Medicare Other | Admitting: Family Medicine

## 2021-04-02 ENCOUNTER — Encounter: Payer: Self-pay | Admitting: Speech Pathology

## 2021-04-02 ENCOUNTER — Ambulatory Visit: Payer: Medicare Other | Admitting: Speech Pathology

## 2021-04-02 VITALS — BP 212/110 | HR 81 | Temp 97.6°F | Ht 72.75 in | Wt 163.2 lb

## 2021-04-02 DIAGNOSIS — I10 Essential (primary) hypertension: Secondary | ICD-10-CM | POA: Diagnosis not present

## 2021-04-02 DIAGNOSIS — N529 Male erectile dysfunction, unspecified: Secondary | ICD-10-CM

## 2021-04-02 DIAGNOSIS — J301 Allergic rhinitis due to pollen: Secondary | ICD-10-CM

## 2021-04-02 DIAGNOSIS — G903 Multi-system degeneration of the autonomic nervous system: Secondary | ICD-10-CM

## 2021-04-02 DIAGNOSIS — R1312 Dysphagia, oropharyngeal phase: Secondary | ICD-10-CM | POA: Diagnosis not present

## 2021-04-02 DIAGNOSIS — Z85819 Personal history of malignant neoplasm of unspecified site of lip, oral cavity, and pharynx: Secondary | ICD-10-CM

## 2021-04-02 DIAGNOSIS — R1311 Dysphagia, oral phase: Secondary | ICD-10-CM

## 2021-04-02 DIAGNOSIS — H9113 Presbycusis, bilateral: Secondary | ICD-10-CM | POA: Diagnosis not present

## 2021-04-02 DIAGNOSIS — K5790 Diverticulosis of intestine, part unspecified, without perforation or abscess without bleeding: Secondary | ICD-10-CM

## 2021-04-02 DIAGNOSIS — E871 Hypo-osmolality and hyponatremia: Secondary | ICD-10-CM | POA: Diagnosis not present

## 2021-04-02 DIAGNOSIS — T464X5A Adverse effect of angiotensin-converting-enzyme inhibitors, initial encounter: Secondary | ICD-10-CM

## 2021-04-02 DIAGNOSIS — R058 Other specified cough: Secondary | ICD-10-CM

## 2021-04-02 NOTE — Patient Instructions (Addendum)
   When eating or drinking, swallow, then immediately cough before you inhale, then do 2-3 extra swallows  When you inhale before you cough, you are inhaling saliva or any residue in your throat into your wind pipe  If you feel like you need to cough, try to cough without a big inhale  Try to get the exercises in when you can (riding in the car)  We increased the IMT (Ihalation) from 30 to 37 (ish) and the EMT (expiration) from 50 to 55  Try for 5 reps of 5 3x a day (75 total)

## 2021-04-02 NOTE — Progress Notes (Signed)
Lee Barnes is a 70 y.o. male who presents for annual wellness visit and follow-up on chronic medical conditions.  He is presently being treated for difficulty with swallowing due to previous aspiration.  This seems to be going fairly well.  He does have a history of cancer dating back to 2011 with subsequent surgery and radiation causing some the present problems.  He also has a history of hypertension as well as neurogenic hypotension and difficulty with hyponatremia.  He does have a history of a's intolerance.  He is presently using hearing aids but does complain of tinnitus.  His allergies seem to be under good control with present medication regimen.  He does have ADD but states that he is not interested in medication at the present time.   Immunizations and Health Maintenance Immunization History  Administered Date(s) Administered  . Influenza Split 10/07/2011, 09/23/2012  . Influenza, High Dose Seasonal PF 10/13/2017, 09/22/2018  . Influenza, Quadrivalent, Recombinant, Inj, Pf 08/23/2019  . Influenza-Unspecified 09/19/2019  . Moderna SARS-COV2 Booster Vaccination 02/28/2021  . Moderna Sars-Covid-2 Vaccination 12/07/2019, 01/04/2020  . Pneumococcal Conjugate-13 11/19/2017  . Pneumococcal-Unspecified 08/18/2014  . Tdap 10/06/2014  . Zoster 12/14/2013   Health Maintenance Due  Topic Date Due  . PNA vac Low Risk Adult (2 of 2 - PPSV23) 08/19/2019    Last colonoscopy: 01/20/14 Last PSA: 11/19/17 Dentist: Q six months Ophtho: Q year Exercise: walking three days a week for thirty min.  Other doctors caring for patient include: Dr. Margrett Rud cardioloy([place]),  Dr. Mena Goes.  Advanced Directives: Does Patient Have a Medical Advance Directive?: No Would patient like information on creating a medical advance directive?: Yes (ED - Information included in AVS)  Depression screen:  See questionnaire below.     Depression screen [facility] 2/9 04/02/2021 03/31/2020 03/30/2019 11/19/2017 07/27/2015   Decreased Interest 0 0 0 0 0  Down, Depressed, Hopeless 0 0 0 0 0  PHQ - 2 Score 0 0 0 0 0    Fall Screen: See Questionaire below.   Fall Risk  04/02/2021 03/31/2020 03/30/2019 11/19/2017 07/27/2015  Falls in the past year? 0 0 0 No No  Number falls in past yr: 0 - - - -  Injury with Fall? 0 - - - -  Risk for fall due to : No Fall Risks - - - -  Follow up Falls evaluation completed - - - -    ADL screen:  See questionnaire below.  Functional Status Survey: Is the patient deaf or have difficulty hearing?: No Does the patient have difficulty seeing, even when wearing glasses/contacts?: No Does the patient have difficulty concentrating, remembering, or making decisions?: No Does the patient have difficulty walking or climbing stairs?: No Does the patient have difficulty dressing or bathing?: No Does the patient have difficulty doing errands alone such as visiting a doctor's office or shopping?: No   Review of Systems  Constitutional: -, -unexpected weight change, -anorexia, -fatigue Allergy: -sneezing, -itching, -congestion Dermatology: denies changing moles, rash, lumps ENT: -runny nose, -ear pain, -sore throat,  Cardiology:  -chest pain, -palpitations, -orthopnea, Respiratory: -cough, -shortness of breath, -dyspnea on exertion, -wheezing,  Gastroenterology: -abdominal pain, -nausea, -vomiting, -diarrhea, -constipation, -dysphagia Hematology: -bleeding or bruising problems Musculoskeletal: -arthralgias, -myalgias, -joint swelling, -back pain, - Ophthalmology: -vision changes,  Urology: -dysuria, -difficulty urinating,  -urinary frequency, -urgency, incontinence Neurology: -, -numbness, , -memory loss, -falls, -dizziness    PHYSICAL EXAM:   General Appearance: Alert, cooperative, no distress, appears stated age Head: Normocephalic,  without obvious abnormality, atraumatic Eyes: PERRL, conjunctiva/corneas clear, EOM's intact,  Ears: Normal TM's and external ear canals Nose:  Nares normal, mucosa normal, no drainage or sinus   tenderness Throat: Lips, mucosa, and tongue normal; teeth and gums normal Neck: Supple, no lymphadenopathy, thyroid:no enlargement/tenderness/nodules; no carotid bruit or JVD Lungs: Clear to auscultation bilaterally without wheezes, rales or ronchi; respirations unlabored Heart: Regular rate and rhythm, S1 and S2 normal, no murmur, rub or gallop Abdomen: Soft, non-tender, nondistended, normoactive bowel sounds, no masses, no hepatosplenomegaly  Skin: Skin color, texture, turgor normal, no rashes or lesions Lymph nodes: Cervical, supraclavicular, and axillary nodes normal Neurologic: CNII-XII intact, normal strength, sensation and gait; reflexes 2+ and symmetric throughout   Psych: Normal mood, affect, hygiene and grooming Recent blood work was reviewed ASSESSMENT/PLAN: History of oropharyngeal cancer  ACE-inhibitor cough  Essential hypertension  Neurogenic orthostatic hypotension (HCC)  Chronic hyponatremia  Oral phase dysphagia  Presbycusis of both ears  Diverticulosis  Seasonal allergic rhinitis due to pollen  Erectile dysfunction, unspecified erectile dysfunction type He will continue to work with physical and Occupational Therapy concerning his swallowing.  He will follow-up with his cardiologist in [place], [place] due to his continued difficulty with orthostatic hypotension and his present blood pressure.  Continue on his allergy medications.  Discussed erectile dysfunction with him and he will call when he wants to pursue either Viagra or Cialis. Immunization recommendations discussed.  Colonoscopy recommendations reviewed.   Medicare Attestation I have personally reviewed: The patient's medical and social history Their use of alcohol, tobacco or illicit drugs Their current medications and supplements The patient's functional ability including ADLs,fall risks, home safety risks, cognitive, and hearing and visual  impairment Diet and physical activities Evidence for depression or mood disorders  The patient's weight, height, and BMI have been recorded in the chart.  I have made referrals, counseling, and provided education to the patient based on review of the above and I have provided the patient with a written personalized care plan for preventive services.     Jill Alexanders, MD   04/02/2021

## 2021-04-02 NOTE — Therapy (Signed)
[facility] [address] [place], [place], [address] Phone: [phone]   Fax:  [phone]  Speech Language Pathology Treatment  Patient Details  Name: Lee Barnes MRN: [id number] Date of Birth: April 26, 1951 Referring Provider (SLP): Dr. Jill Barnes   Encounter Date: 04/02/2021   End of Session - 04/02/21 1201    Visit Number 4    Number of Visits 17    Date for SLP Re-Evaluation 05/16/21    Authorization Type none    SLP Start Time 1108    SLP Stop Time  1142    SLP Time Calculation (min) 34 min    Activity Tolerance Patient tolerated treatment well           Past Medical History:  Diagnosis Date  . Allergy to environmental factors   . Complication of anesthesia    "vageled" after surgery -overnite stay  . Diverticulosis 2008  . Heart murmur    hx of in childhood   . History of chemotherapy   . History of radiation therapy 11/05/10-12/26/10   r base tongue, 7000 cGy 35 sessions  . Hypertension    Medication started in May 2016.  Marland Kitchen Oropharynx cancer ([place]) 09/2010  . Pneumonia    hx of 2012  . 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 Tinnitus     Past Surgical History:  Procedure Laterality Date  . APPENDECTOMY    . COLONOSCOPY    . INGUINAL HERNIA REPAIR Bilateral 03/30/2015   Procedure: LAPAROSCOPIC BILATERAL INGUINAL HERNIA REPAIR WITH MESH;  Surgeon: Coralie Keens, MD;  Location: [facility];  Service: General;  Laterality: Bilateral;  . INGUINAL HERNIA REPAIR Right 1960  . INGUINAL HERNIA REPAIR Bilateral 03/30/2015  . INGUINAL HERNIA REPAIR Left 07/11/2015  . INGUINAL HERNIA REPAIR N/A 07/11/2015   Procedure: REPAIR OF LEFT INGUINAL HERNIA WITH MESH;  Surgeon: Coralie Keens, MD;  Location: [facility];  Service: General;  Laterality: N/A;  . INSERTION OF MESH N/A 07/11/2015   Procedure: INSERTION OF MESH;  Surgeon: Coralie Keens, MD;  Location: [facility];  Service: General;  Laterality:  N/A;  . LUMBAR [place]    . TONSILLECTOMY      There were no vitals filed for this visit.   Subjective Assessment - 04/02/21 1129    Subjective "I took a day off yesterday it was my grandson's 4th birthday"    Currently in Pain? No/denies                 ADULT SLP TREATMENT - 04/02/21 1125      General Information   Behavior/Cognition Alert;Cooperative;Pleasant mood      Treatment Provided   Treatment provided Cognitive-Linquistic      Dysphagia Treatment   Temperature Spikes Noted No    Respiratory Status Room air    Oral Cavity - Dentition Adequate natural dentition    Treatment Methods Therapeutic exercise;Compensation strategy training;Patient/caregiver education    Patient observed directly with PO's Yes    Type of PO's observed Thin liquids    Feeding Able to feed self    Liquids provided via Cup    Pharyngeal Phase Signs & Symptoms Immediate cough;Delayed cough;Immediate throat clear;Delayed throat clear;Wet vocal quality    Type of cueing Verbal    Amount of cueing Minimal    Other treatment/comments Lee Barnes completed HEP with supervision A, for all exercises. 1 min verbal cue to clafify "bike pump" sound on IMT. Lee Barnes was initialing  completion of both EMT and IMT with rare min A, and clarification of his questions. He is completing 5 sets of 5 reps 2-3x a day. Training in swallow precautions initiated with min A. He is verbalizing and following swallow precautions with min A. Continue skilled ST to improve airway protection and reduce risk of aspiration pna.    Speech Therapy Frequency 2x / week    Duration 8 weeks   17 visits   Treatment/Interventions Aspiration precaution training;Pharyngeal strengthening exercises;Diet toleration management by SLP;Trials of upgraded texture/liquids;Internal/external aids;Patient/family education;Compensatory strategies;Oral motor exercises;Environmental controls;SLP instruction and feedback;Other (comment)    Potential to Achieve Goals Fair    Potential Considerations Co-morbidities;Previous level of function;Severity of impairments           Patient will benefit from skilled therapeutic intervention in order to improve the following deficits and impairments:   Dysphagia, oropharyngeal phase    Problem List Patient Active Problem List   Diagnosis Date Noted  . Chronic hyponatremia 02/20/2021  . Dysphagia 12/12/2020  . Neurogenic orthostatic hypotension ([facility]) 03/30/2019  . 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 ACE-inhibitor cough 05/25/2015  . Essential hypertension 03/21/2015  . Allergic rhinitis 12/14/2013  . ED (erectile dysfunction) 12/14/2013  . Diverticulosis   . History of oropharyngeal cancer 09/18/2010    Lee Barnes, Annye Rusk MS, CCC-SLP 04/02/2021, 12:01 PM  [facility] [address] [place], [place], [address] Phone: [phone]   Fax:  [phone]   Name: Lee Barnes MRN: [id number] Date of Birth: 1951/06/01  completion of both EMT and IMT with rare min A, and clarification of his questions. He is completing 5 sets of 5 reps 2-3x a day. Training in swallow precautions initiated with min A. He is verbalizing and following swallow precautions with min A. Continue skilled ST to improve airway protection and reduce risk of aspiration pna.    Speech Therapy Frequency 2x / week    Duration 8 weeks   17 visits   Treatment/Interventions Aspiration precaution training;Pharyngeal strengthening exercises;Diet toleration management by SLP;Trials of upgraded texture/liquids;Internal/external aids;Patient/family education;Compensatory strategies;Oral motor exercises;Environmental controls;SLP instruction and feedback;Other (comment)    Potential to Achieve Goals Fair    Potential Considerations Co-morbidities;Previous level of function;Severity of impairments           Patient will benefit from skilled therapeutic intervention in order to improve the following deficits and impairments:   Dysphagia, oropharyngeal phase    Problem List Patient Active Problem List   Diagnosis Date Noted  . Chronic hyponatremia 02/20/2021  . Dysphagia 12/12/2020  . Neurogenic orthostatic hypotension ([place]) 03/30/2019  . 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 ACE-inhibitor cough 05/25/2015  . Essential hypertension 03/21/2015  . Allergic rhinitis 12/14/2013  . ED (erectile dysfunction) 12/14/2013  . Diverticulosis   . History of oropharyngeal cancer 09/18/2010    Lee Barnes, Annye Rusk MS, CCC-SLP 04/02/2021, 12:01 PM  [facility] [address] [place], [place], [address] Phone: [phone]   Fax:  [phone]   Name: Lee Barnes MRN: [id number] Date of Birth: 1951/06/01

## 2021-04-02 NOTE — Patient Instructions (Signed)
  Lee Barnes , Thank you for taking time to come for your Medicare Wellness Visit. I appreciate your ongoing commitment to your health goals. Please review the following plan we discussed and let me know if I can assist you in the future.   These are the goals we discussed: Goals   None     This is a list of the screening recommended for you and due dates:  Health Maintenance  Topic Date Due  . Pneumonia vaccines (2 of 2 - PPSV23) 08/19/2019  . Flu Shot  06/18/2021  . Colon Cancer Screening  01/21/2024  . Tetanus Vaccine  10/06/2024  . COVID-19 Vaccine  Completed  . Hepatitis C Screening: USPSTF Recommendation to screen - Ages 56-79 yo.  Completed  . HPV Vaccine  Aged Out

## 2021-04-04 ENCOUNTER — Ambulatory Visit: Payer: Medicare Other

## 2021-04-09 ENCOUNTER — Ambulatory Visit: Payer: Medicare Other | Admitting: Speech Pathology

## 2021-04-09 ENCOUNTER — Other Ambulatory Visit: Payer: Self-pay

## 2021-04-09 ENCOUNTER — Encounter: Payer: Self-pay | Admitting: Speech Pathology

## 2021-04-09 DIAGNOSIS — R1312 Dysphagia, oropharyngeal phase: Secondary | ICD-10-CM

## 2021-04-09 NOTE — Therapy (Signed)
[facility] [address] [place], [place], [address] Phone: [phone]   Fax:  [phone]  Speech Language Pathology Treatment  Patient Details  Name: Lee Barnes MRN: [id number] Date of Birth: Nov 22, 1950 Referring Provider (SLP): Dr. Sharlot Gowda   Encounter Date: 04/09/2021   End of Session - 04/09/21 1138    Visit Number 5    Number of Visits 17    Date for SLP Re-Evaluation 05/16/21    Authorization Type none    SLP Start Time 1102    SLP Stop Time  1130    SLP Time Calculation (min) 28 min    Activity Tolerance Patient tolerated treatment well           Past Medical History:  Diagnosis Date  . Allergy to environmental factors   . Complication of anesthesia    "vageled" after surgery -overnite stay  . Diverticulosis 2008  . Heart murmur    hx of in childhood   . History of chemotherapy   . History of radiation therapy 11/05/10-12/26/10   r base tongue, 7000 cGy 35 sessions  . Hypertension    Medication started in May 2016.  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 Pneumonia    hx of 2012  . 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 Tinnitus     Past Surgical History:  Procedure Laterality Date  . APPENDECTOMY    . COLONOSCOPY    . INGUINAL HERNIA REPAIR Bilateral 03/30/2015   Procedure: LAPAROSCOPIC BILATERAL INGUINAL HERNIA REPAIR WITH MESH;  Surgeon: Abigail Miyamoto, MD;  [facility];  Service: General;  Laterality: Bilateral;  . INGUINAL HERNIA REPAIR Right 1960  . INGUINAL HERNIA REPAIR Bilateral 03/30/2015  . INGUINAL HERNIA REPAIR Left 07/11/2015  . INGUINAL HERNIA REPAIR N/A 07/11/2015   Procedure: REPAIR OF LEFT INGUINAL HERNIA WITH MESH;  Surgeon: Abigail Miyamoto, MD;  Location: [facility];  Service: General;  Laterality: N/A;  . INSERTION OF MESH N/A 07/11/2015   Procedure: INSERTION OF MESH;  Surgeon: Abigail Miyamoto, MD;  Location: [facility];  Service: General;  Laterality:  N/A;  . LUMBAR DISC SURGERY    . TONSILLECTOMY      There were no vitals filed for this visit.   Subjective Assessment - 04/09/21 1106    Subjective "We've had people at our house, so I havent' been able to do them as often as I should"    Currently in Pain? No/denies                 ADULT SLP TREATMENT - 04/09/21 1107      General Information   Behavior/Cognition Alert;Cooperative;Pleasant mood      Treatment Provided   Treatment provided Cognitive-Linquistic      Dysphagia Treatment   Temperature Spikes Noted No    Respiratory Status Room air    Oral Cavity - Dentition Adequate natural dentition    Treatment Methods Therapeutic exercise;Compensation strategy training;Patient/caregiver education    Patient observed directly with PO's Yes    Type of PO's observed Thin liquids    Feeding Able to feed self    Liquids provided via Cup    Pharyngeal Phase Signs & Symptoms Immediate cough;Delayed cough;Immediate throat clear;Delayed throat clear;Wet vocal quality   cough/throat clear each swallow is swallow precautions   Type of cueing Verbal    Amount of cueing Minimal    Other treatment/comments With PO trials of soft solid and thin  liquids, Lee Barnes followed swallow precautions with occasional min A. He completed EMT and IMT with mod I, reporting IMT felt "a little tight" Provided feed back that he is completing the RMT exercises accurately. HEP for dysphagia completed with supervision cues. However Mendelson was compllted correctly last session, but required usual mod A to complete accurately today. Encouraged Lee Barnes to complete HEP and RMT at least twice a day while on vacation      Assessment / Recommendations / Plan   Plan Continue with current plan of care      Dysphagia Recommendations   Diet recommendations Regular;Thin liquid    Liquids provided via Cup    Medication Administration Whole meds with liquid    Supervision Patient able to self feed    Compensations Slow  rate;Small sips/bites;Multiple dry swallows after each bite/sip;Hard cough after swallow;Effortful swallow    Postural Changes and/or Swallow Maneuvers Out of bed for meals;Seated upright 90 degrees;Upright 30-60 min after meal      Progression Toward Goals   Progression toward goals Progressing toward goals            SLP Education - 04/09/21 1134    Education Details HEP at least twice a day; RMT at least twice a day; no distractions/talking while eating    Person(s) Educated Patient    Methods Explanation;Demonstration;Verbal cues;Handout    Comprehension Verbalized understanding;Returned demonstration;Verbal cues required;Need further instruction            SLP Short Term Goals - 04/09/21 1138      SLP SHORT TERM GOAL #1   Title Pt will follow swallow precautions with rare min A over 2 sessions    Time 2    Period Weeks    Status On-going      SLP SHORT TERM GOAL #2   Title Pt will complete HEP for dysphagia with rare min A over 2 sessions    Baseline 04/02/21    Time 2    Period Weeks    Status On-going      SLP SHORT TERM GOAL #3   Title Pt will complete RMT HEP of 3 sets of 25 repetitions of IMT and EMT at 60-70% of MIP and MEP 5/7 days for 4 weeks    Time 2    Period Weeks    Status On-going            SLP Long Term Goals - 04/09/21 1138      SLP LONG TERM GOAL #1   Title Pt will follow swallow precautions with mod I over 2 sessoins    Time 6    Period Weeks    Status On-going      SLP LONG TERM GOAL #2   Title Pt will complete HEP for dysphagia with mod I over 2 sessions    Time 6    Period Weeks    Status On-going      SLP LONG TERM GOAL #3   Title Pt will demonstrate improved airway protection, glottal closure by not contracting aspiration pna over 8 weeks.    Time 6    Period Weeks    Status On-going            Plan - 04/09/21 1134    Clinical Impression Statement Lee Barnes is demonstrating accurate completion of both EMT and IMT with rare  min A, and clarification of his questions. He is completing 5 sets of 5 reps 2 a day. ST recommends 3x a day.  Swallow  precautions completed with  with min A. He is verbalizing and following swallow precautions with min A. HEP for dysphagia with rare min A, except Mendelson which continues to be inconsistent. Continue skilled ST to improve airway protection and reduce risk of aspiration pna. He continues to deny any s/s of apsiration pna. He is traveling, leaving Wednesday, 04/10/21 and returns to ST 6/8, at which time ST will increase RMT's resistance.    Speech Therapy Frequency 2x / week    Duration 8 weeks   17 visits   Treatment/Interventions Aspiration precaution training;Pharyngeal strengthening exercises;Diet toleration management by SLP;Trials of upgraded texture/liquids;Internal/external aids;Patient/family education;Compensatory strategies;Oral motor exercises;Environmental controls;SLP instruction and feedback;Other (comment)           Patient will benefit from skilled therapeutic intervention in order to improve the following deficits and impairments:   Dysphagia, oropharyngeal phase    Problem List Patient Active Problem List   Diagnosis Date Noted  . Chronic hyponatremia 02/20/2021  . Dysphagia 12/12/2020  . 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 ACE-inhibitor cough 05/25/2015  . Essential hypertension 03/21/2015  . Allergic rhinitis 12/14/2013  . ED (erectile dysfunction) 12/14/2013  . Diverticulosis   . History of oropharyngeal cancer 09/18/2010    Kathalina Ostermann, Radene Journey MS, CCC-SLP 04/09/2021, 11:39 AM  [facility] [address] [place], [place], [address] Phone: [phone]   Fax:  [phone]   Name: Lee Barnes MRN: [id number] Date of Birth: 06-Mar-1951

## 2021-04-09 NOTE — Patient Instructions (Signed)
   Try to get your exercises and respiratory training completed on vacation  Remember you can do these on the plane because you'll have a mask on  Also remember to focus on your swallowing and try to eliminate distractions, including talking while eating   Don't put food in your mouth if you are laughing while you are eating

## 2021-04-21 DIAGNOSIS — Z801 Family history of malignant neoplasm of trachea, bronchus and lung: Secondary | ICD-10-CM | POA: Diagnosis not present

## 2021-04-21 DIAGNOSIS — J690 Pneumonitis due to inhalation of food and vomit: Secondary | ICD-10-CM | POA: Diagnosis present

## 2021-04-21 DIAGNOSIS — R918 Other nonspecific abnormal finding of lung field: Secondary | ICD-10-CM | POA: Diagnosis not present

## 2021-04-21 DIAGNOSIS — Z85810 Personal history of malignant neoplasm of tongue: Secondary | ICD-10-CM | POA: Diagnosis not present

## 2021-04-21 DIAGNOSIS — R0902 Hypoxemia: Secondary | ICD-10-CM | POA: Diagnosis not present

## 2021-04-21 DIAGNOSIS — Z923 Personal history of irradiation: Secondary | ICD-10-CM | POA: Diagnosis not present

## 2021-04-21 DIAGNOSIS — Z9221 Personal history of antineoplastic chemotherapy: Secondary | ICD-10-CM | POA: Diagnosis not present

## 2021-04-21 DIAGNOSIS — I10 Essential (primary) hypertension: Secondary | ICD-10-CM | POA: Diagnosis present

## 2021-04-21 DIAGNOSIS — E871 Hypo-osmolality and hyponatremia: Secondary | ICD-10-CM | POA: Diagnosis present

## 2021-04-21 DIAGNOSIS — R1310 Dysphagia, unspecified: Secondary | ICD-10-CM | POA: Diagnosis present

## 2021-04-21 DIAGNOSIS — Z8249 Family history of ischemic heart disease and other diseases of the circulatory system: Secondary | ICD-10-CM | POA: Diagnosis not present

## 2021-04-21 DIAGNOSIS — R079 Chest pain, unspecified: Secondary | ICD-10-CM | POA: Diagnosis not present

## 2021-04-21 DIAGNOSIS — Z743 Need for continuous supervision: Secondary | ICD-10-CM | POA: Diagnosis not present

## 2021-04-21 DIAGNOSIS — Z79899 Other long term (current) drug therapy: Secondary | ICD-10-CM | POA: Diagnosis not present

## 2021-04-21 DIAGNOSIS — J9811 Atelectasis: Secondary | ICD-10-CM | POA: Diagnosis not present

## 2021-04-25 ENCOUNTER — Other Ambulatory Visit: Payer: Self-pay

## 2021-04-25 ENCOUNTER — Ambulatory Visit: Payer: Medicare Other | Attending: Family Medicine

## 2021-04-25 DIAGNOSIS — R471 Dysarthria and anarthria: Secondary | ICD-10-CM | POA: Diagnosis not present

## 2021-04-25 DIAGNOSIS — R1312 Dysphagia, oropharyngeal phase: Secondary | ICD-10-CM | POA: Diagnosis not present

## 2021-04-25 NOTE — Therapy (Signed)
and reduce risk of aspiration pna. He continues to deny any s/s of apsiration pna. He is traveling, leaving Wednesday, 04/10/21 and returns to [place] 6/8, at which time ST will increase RMT's resistance.    Speech Therapy Frequency 2x / week    Duration 8 weeks   17 visits   Treatment/Interventions Aspiration precaution training;Pharyngeal strengthening exercises;Diet toleration management by SLP;Trials of upgraded texture/liquids;Internal/external aids;Patient/family education;Compensatory strategies;Oral motor exercises;Environmental controls;SLP instruction and feedback;Other (comment)           Patient will benefit from skilled therapeutic intervention in order to improve the following deficits and impairments:   Dysphagia, oropharyngeal phase    Problem List Patient Active Problem List   Diagnosis Date Noted  . Chronic hyponatremia 02/20/2021  . Dysphagia 12/12/2020  . 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 ACE-inhibitor cough 05/25/2015  . Essential hypertension 03/21/2015  . Allergic rhinitis 12/14/2013  . ED (erectile dysfunction) 12/14/2013  . Diverticulosis   . History of oropharyngeal cancer 09/18/2010    [facility] ,[place], [place]  04/25/2021, 3:49 PM  [facility] [address] [place], [place], [address] Phone: [phone]   Fax:  [phone]   Name: Lee Barnes MRN: [id number] Date of Birth: 06/13/1951  [facility] [address] [place], [place], [address] Phone: [phone]   Fax:  [phone]  Speech Language Pathology Treatment  Patient Details  Name: Lee Barnes MRN: [id number] Date of Birth: 29-Oct-1951 Referring Provider (SLP): Dr. Jill Alexanders   Encounter Date: 04/25/2021   End of Session - 04/25/21 1546    Visit Number 6    Number of Visits 17    Date for SLP Re-Evaluation 05/16/21    Authorization Type none    SLP Start Time 1103    SLP Stop Time  1145    SLP Time Calculation (min) 42 min    Activity Tolerance Patient tolerated treatment well           Past Medical History:  Diagnosis Date  . Allergy to environmental factors   . Complication of anesthesia    "vageled" after surgery -overnite stay  . Diverticulosis 2008  . Heart murmur    hx of in childhood   . History of chemotherapy   . History of radiation therapy 11/05/10-12/26/10   r base tongue, 7000 cGy 35 sessions  . Hypertension    Medication started in May 2016.  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 Pneumonia    hx of 2012  . 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 Tinnitus     Past Surgical History:  Procedure Laterality Date  . APPENDECTOMY    . COLONOSCOPY    . INGUINAL HERNIA REPAIR Bilateral 03/30/2015   Procedure: LAPAROSCOPIC BILATERAL INGUINAL HERNIA REPAIR WITH MESH;  Surgeon: Coralie Keens, MD;  Location: [facility];  Service: General;  Laterality: Bilateral;  . INGUINAL HERNIA REPAIR Right 1960  . INGUINAL HERNIA REPAIR Bilateral 03/30/2015  . INGUINAL HERNIA REPAIR Left 07/11/2015  . INGUINAL HERNIA REPAIR N/A 07/11/2015   Procedure: REPAIR OF LEFT INGUINAL HERNIA WITH MESH;  Surgeon: Coralie Keens, MD;  Location: [facility];  Service: General;  Laterality: N/A;  . INSERTION OF MESH N/A 07/11/2015   Procedure: INSERTION OF MESH;  Surgeon: Coralie Keens, MD;  Location: [facility];  Service: General;  Laterality:  N/A;  . LUMBAR [place]    . TONSILLECTOMY      There were no vitals filed for this visit.   Subjective Assessment - 04/25/21 1105    Subjective "Saturday Sunday Monday I was in hospital with low sodium."    Currently in Pain? No/denies                 ADULT SLP TREATMENT - 04/25/21 1106      General Information   Behavior/Cognition Alert;Cooperative;Pleasant mood      Treatment Provided   Treatment provided Dysphagia      Dysphagia Treatment   Temperature Spikes Noted No    Respiratory Status Room air    Oral Cavity - Dentition Adequate natural dentition    Treatment Methods Therapeutic exercise;Patient/caregiver education;Skilled observation    Other treatment/comments Pt "did not eat much" on vacation due to taste aversions (spicy food) which reportedly made his sodium decrease and he had to be admitted into the hospital. Prior to Friday, pt told SLP he was completing exercises once or twice a day. Pt perofrmed EMST with good sounds for first 2 sets and after that time with weaker blows - appropriate sound 50% of the time in 25 reps divided into 5 reps x5 sets. SLP did not change setting on EMST device. With IMST device pt had 10/10  and reduce risk of aspiration pna. He continues to deny any s/s of apsiration pna. He is traveling, leaving Wednesday, 04/10/21 and returns to [place] 6/8, at which time ST will increase RMT's resistance.    Speech Therapy Frequency 2x / week    Duration 8 weeks   17 visits   Treatment/Interventions Aspiration precaution training;Pharyngeal strengthening exercises;Diet toleration management by SLP;Trials of upgraded texture/liquids;Internal/external aids;Patient/family education;Compensatory strategies;Oral motor exercises;Environmental controls;SLP instruction and feedback;Other (comment)           Patient will benefit from skilled therapeutic intervention in order to improve the following deficits and impairments:   Dysphagia, oropharyngeal phase    Problem List Patient Active Problem List   Diagnosis Date Noted  . Chronic hyponatremia 02/20/2021  . Dysphagia 12/12/2020  . 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 ACE-inhibitor cough 05/25/2015  . Essential hypertension 03/21/2015  . Allergic rhinitis 12/14/2013  . ED (erectile dysfunction) 12/14/2013  . Diverticulosis   . History of oropharyngeal cancer 09/18/2010    [facility] ,[place], [place]  04/25/2021, 3:49 PM  [place] [address] [place], [place], [address] Phone: [phone]   Fax:  [phone]   Name: Lee Barnes MRN: [id number] Date of Birth: 06/13/1951

## 2021-04-26 ENCOUNTER — Encounter: Payer: Self-pay | Admitting: Family Medicine

## 2021-04-26 ENCOUNTER — Ambulatory Visit
Admission: RE | Admit: 2021-04-26 | Discharge: 2021-04-26 | Disposition: A | Discharge status: Home / Self Care | Payer: Medicare Other | Source: Ambulatory Visit | Attending: Family Medicine | Admitting: Family Medicine

## 2021-04-26 ENCOUNTER — Ambulatory Visit (INDEPENDENT_AMBULATORY_CARE_PROVIDER_SITE_OTHER): Payer: Medicare Other | Admitting: Family Medicine

## 2021-04-26 VITALS — BP 150/90 | HR 87 | Temp 97.7°F | Ht 75.0 in | Wt 157.0 lb

## 2021-04-26 DIAGNOSIS — R918 Other nonspecific abnormal finding of lung field: Secondary | ICD-10-CM | POA: Diagnosis not present

## 2021-04-26 DIAGNOSIS — L309 Dermatitis, unspecified: Secondary | ICD-10-CM | POA: Diagnosis not present

## 2021-04-26 DIAGNOSIS — Z8701 Personal history of pneumonia (recurrent): Secondary | ICD-10-CM | POA: Diagnosis not present

## 2021-04-26 DIAGNOSIS — R1311 Dysphagia, oral phase: Secondary | ICD-10-CM

## 2021-04-26 DIAGNOSIS — E871 Hypo-osmolality and hyponatremia: Secondary | ICD-10-CM

## 2021-04-26 DIAGNOSIS — J690 Pneumonitis due to inhalation of food and vomit: Secondary | ICD-10-CM | POA: Diagnosis not present

## 2021-04-26 LAB — COMPREHENSIVE METABOLIC PANEL
ALT: 9 IU/L (ref 0–44)
AST: 15 IU/L (ref 0–40)
Albumin/Globulin Ratio: 1.3 (ref 1.2–2.2)
Albumin: 4.3 g/dL (ref 3.8–4.8)
Alkaline Phosphatase: 117 IU/L (ref 44–121)
BUN/Creatinine Ratio: 13 (ref 10–24)
BUN: 13 mg/dL (ref 8–27)
Bilirubin Total: 0.4 mg/dL (ref 0.0–1.2)
CO2: 27 mmol/L (ref 20–29)
Calcium: 9.5 mg/dL (ref 8.6–10.2)
Chloride: 92 mmol/L — ABNORMAL LOW (ref 96–106)
Creatinine, Ser: 1.02 mg/dL (ref 0.76–1.27)
Globulin, Total: 3.2 g/dL (ref 1.5–4.5)
Glucose: 90 mg/dL (ref 65–99)
Potassium: 4.9 mmol/L (ref 3.5–5.2)
Sodium: 130 mmol/L — ABNORMAL LOW (ref 134–144)
Total Protein: 7.5 g/dL (ref 6.0–8.5)
eGFR: 80 mL/min/{1.73_m2} (ref >59)

## 2021-04-26 NOTE — Progress Notes (Signed)
Subjective:    Patient ID: Lee Barnes, male    DOB: 1951-02-20, 70 y.o.   MRN: [id number]  HPI He is here for an interval evaluation.  He recently went on a trip to visit some friends in [place].  His eating habits and fluid intake changed.  He had difficulty sticking to his program for swallowing because of this.  He then ran into trouble and was admitted to a local hospital on Saturday with a sodium of 121.  He states that they had trouble with x-rays as well as his IV hydration.  He left the hospital with a sodium of 126.  He did have slight hemoptysis but states he has not seen any blood in the last day or so.  No chest pain, shortness of breath.  They did give him a short course of an antibiotic to cover for possible aspiration pneumonia.  Presently he seems to be doing well and not fatigued or coughing.  He does complain of a rash on his back. Of note is the fact that he has had 3 hospitalizations this year for hyponatremia.  Review of Systems     Objective:   Physical Exam Alert and in no distress with normal breathing pattern.  Lungs are clear to auscultation.  Cardiac exam shows regular rhythm without murmurs or gallops.  Exam of the back does show slight erythema and healing lesions.       Assessment & Plan:  Oral phase dysphagia - Plan: Comprehensive metabolic panel  Chronic hyponatremia - Plan: Comprehensive metabolic panel  History of aspiration pneumonia - Plan: DG Chest 2 View  Dermatitis His daughter is with him and his wife then came on the phone.  We are all on the same page of getting him back to his normal routine of hydration and method of eating based on the speech path and swallowing protocol.  He is to follow-up with renal concerning anything else that can be done with his sodium and if not we will probably need to keep track of his sodium on a monthly basis if not sooner. Recommend he use cortisone cream for the rash on his back and if continued difficulty  he will call for referral.  Greater than 30 minutes spent discussing this with him and his wife and daughter.

## 2021-04-27 ENCOUNTER — Ambulatory Visit: Payer: Medicare Other

## 2021-04-27 ENCOUNTER — Other Ambulatory Visit: Payer: Self-pay

## 2021-04-27 DIAGNOSIS — R1312 Dysphagia, oropharyngeal phase: Secondary | ICD-10-CM | POA: Diagnosis not present

## 2021-04-27 DIAGNOSIS — M71571 Other bursitis, not elsewhere classified, right ankle and foot: Secondary | ICD-10-CM | POA: Diagnosis not present

## 2021-04-27 DIAGNOSIS — M21621 Bunionette of right foot: Secondary | ICD-10-CM | POA: Diagnosis not present

## 2021-04-27 DIAGNOSIS — M71572 Other bursitis, not elsewhere classified, left ankle and foot: Secondary | ICD-10-CM | POA: Diagnosis not present

## 2021-04-27 DIAGNOSIS — R471 Dysarthria and anarthria: Secondary | ICD-10-CM | POA: Diagnosis not present

## 2021-04-27 DIAGNOSIS — M21622 Bunionette of left foot: Secondary | ICD-10-CM | POA: Diagnosis not present

## 2021-04-27 NOTE — Therapy (Signed)
[facility] [address] [place], [place], [address] Phone: [phone]   Fax:  [phone]  Speech Language Pathology Treatment  Patient Details  Name: Lee Barnes MRN: [id number] Date of Birth: 09-05-1951 Referring Provider (SLP): Dr. Jill Alexanders   Encounter Date: 04/27/2021   End of Session - 04/27/21 1356     Visit Number 7    Number of Visits 17    Date for SLP Re-Evaluation 05/16/21    Authorization Type none    SLP Start Time 1102    SLP Stop Time  1140    SLP Time Calculation (min) 38 min    Activity Tolerance Patient tolerated treatment well             Past Medical History:  Diagnosis Date   Allergy to environmental factors    Complication of anesthesia    "vageled" after surgery -overnite stay   Diverticulosis 2008   Heart murmur    hx of in childhood    History of chemotherapy    History of radiation therapy 11/05/10-12/26/10   r base tongue, 7000 cGy 35 sessions   Hypertension    Medication started in May 2016.   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   Tinnitus     Past Surgical History:  Procedure Laterality Date   APPENDECTOMY     COLONOSCOPY     INGUINAL HERNIA REPAIR Bilateral 03/30/2015   Procedure: LAPAROSCOPIC BILATERAL INGUINAL HERNIA REPAIR WITH MESH;  Surgeon: Coralie Keens, MD;  Location: [facility];  Service: General;  Laterality: Bilateral;   INGUINAL HERNIA REPAIR Right [place] Bilateral 03/30/2015   INGUINAL HERNIA REPAIR Left 07/11/2015   INGUINAL HERNIA REPAIR N/A 07/11/2015   Procedure: REPAIR OF LEFT INGUINAL HERNIA WITH MESH;  Surgeon: Coralie Keens, MD;  Location: [facility];  Service: General;  Laterality: N/A;   INSERTION OF MESH N/A 07/11/2015   Procedure: INSERTION OF MESH;  Surgeon: Coralie Keens, MD;  Location: [facility];  Service: General;  Laterality: N/A;   LUMBAR  DISC SURGERY     TONSILLECTOMY      There were no vitals filed for this visit.   Subjective Assessment - 04/27/21 1346     Subjective Pt requested another copy of his HEP for dysphagia.    Currently in Pain? No/denies                   ADULT SLP TREATMENT - 04/27/21 1347       General Information   Behavior/Cognition Alert;Cooperative;Pleasant mood      Treatment Provided   Treatment provided Dysphagia      Dysphagia Treatment   Temperature Spikes Noted No    Respiratory Status Room air    Oral Cavity - Dentition Adequate natural dentition    Treatment Methods Therapeutic exercise;Patient/caregiver education;Skilled observation;Compensation strategy training    Patient observed directly with PO's Yes    Type of PO's observed Thin liquids;Dysphagia 3 (soft)   cereal bar, water   Liquids provided via Cup    Oral Phase Signs & Symptoms Other (comment)   liquid placement on lt labial margin moreso than midline   Pharyngeal Phase Signs & Symptoms Immediate throat clear;Wet vocal quality;Audible swallow;Multiple swallows    Type of cueing Verbal    Amount of cueing --   min-mod   Other treatment/comments  Plan - 04/27/21 1356     Clinical Impression Statement Lee Barnes is demonstrating accurate completion of both EMT and IMT. He acknowledged today he will complete his dysphagia HEP in it's entirety. SLP strongly stressed BID or TID as directed. Swallow precautions completed with  with initial mod fading to independent. Continue skilled ST to improve airway protection and reduce risk of aspiration pna. He continues to deny any s/s of apsiration pna.    Speech Therapy Frequency 2x / week    Duration 8 weeks   17 visits   Treatment/Interventions Aspiration precaution training;Pharyngeal strengthening exercises;Diet toleration management by SLP;Trials of upgraded texture/liquids;Internal/external aids;Patient/family education;Compensatory strategies;Oral motor exercises;Environmental controls;SLP instruction and feedback;Other (comment)             Patient will benefit from skilled therapeutic intervention in order to improve the following deficits and impairments:   Dysphagia, oropharyngeal phase    Problem List Patient Active Problem List   Diagnosis Date Noted   Chronic hyponatremia 02/20/2021   Dysphagia 12/12/2020   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   ACE-inhibitor cough 05/25/2015   Essential hypertension 03/21/2015   Allergic rhinitis 12/14/2013   ED (erectile dysfunction) 12/14/2013   Diverticulosis    History of oropharyngeal cancer 09/18/2010    [facility] ,MS, CCC-SLP  04/27/2021, 1:59 PM  [facility] [address] [place], [place], [address] Phone: [phone]   Fax:  [phone]   Name: Lee Barnes MRN: [id number] Date of Birth: [birth year]  Plan - 04/27/21 1356     Clinical Impression Statement Lee Barnes is demonstrating accurate completion of both EMT and IMT. He acknowledged today he will complete his dysphagia HEP in it's entirety. SLP strongly stressed BID or TID as directed. Swallow precautions completed with  with initial mod fading to independent. Continue skilled ST to improve airway protection and reduce risk of aspiration pna. He continues to deny any s/s of apsiration pna.    Speech Therapy Frequency 2x / week    Duration 8 weeks   17 visits   Treatment/Interventions Aspiration precaution training;Pharyngeal strengthening exercises;Diet toleration management by SLP;Trials of upgraded texture/liquids;Internal/external aids;Patient/family education;Compensatory strategies;Oral motor exercises;Environmental controls;SLP instruction and feedback;Other (comment)             Patient will benefit from skilled therapeutic intervention in order to improve the following deficits and impairments:   Dysphagia, oropharyngeal phase    Problem List Patient Active Problem List   Diagnosis Date Noted   Chronic hyponatremia 02/20/2021   Dysphagia 12/12/2020   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   ACE-inhibitor cough 05/25/2015   Essential hypertension 03/21/2015   Allergic rhinitis 12/14/2013   ED (erectile dysfunction) 12/14/2013   Diverticulosis    History of oropharyngeal cancer 09/18/2010    [facility] ,MS, CCC-SLP  04/27/2021, 1:59 PM  [facility] [address] [place], [place], [address] Phone: [phone]   Fax:  [phone]   Name: Lee Barnes MRN: [id number] Date of Birth: [birth year]

## 2021-04-27 NOTE — Patient Instructions (Signed)
  Swallow precuations:   Eliminate distractions with meals Take small bites and sips Hard swallow Cough after each swallow Do 2-3 more dry swallows Eat fully upright   Oral care is extremely important to keep bacteria out of your lungs - floss and brush after meals

## 2021-04-30 ENCOUNTER — Encounter: Payer: Self-pay | Admitting: Speech Pathology

## 2021-04-30 ENCOUNTER — Ambulatory Visit: Payer: Medicare Other | Admitting: Speech Pathology

## 2021-04-30 ENCOUNTER — Telehealth: Payer: Self-pay

## 2021-04-30 ENCOUNTER — Other Ambulatory Visit: Payer: Self-pay

## 2021-04-30 DIAGNOSIS — R1312 Dysphagia, oropharyngeal phase: Secondary | ICD-10-CM | POA: Diagnosis not present

## 2021-04-30 DIAGNOSIS — R471 Dysarthria and anarthria: Secondary | ICD-10-CM | POA: Diagnosis not present

## 2021-04-30 NOTE — Telephone Encounter (Signed)
Please advise when pt should come back for sodium recheck. [place]

## 2021-04-30 NOTE — Patient Instructions (Signed)
   No need to hold your swallow when you stick  your tongue out to swallow, just do a complete swallow then take a couple seconds before you do it again  Limit distractions when you eat  Do your best to get in the exercises and respiratory training in at least twice a day

## 2021-04-30 NOTE — Therapy (Signed)
[facility] [address] [place], [place], [address] Phone: [phone]   Fax:  [phone]  Speech Language Pathology Treatment  Patient Details  Name: GARNEY MINION MRN: [id number] Date of Birth: 1951-10-23 Referring Provider (SLP): Dr. Sharlot Gowda   Encounter Date: 04/30/2021   End of Session - 04/30/21 1148     Visit Number 8    Number of Visits 17    Date for SLP Re-Evaluation 05/16/21    SLP Start Time 1100    SLP Stop Time  1141    SLP Time Calculation (min) 41 min    Activity Tolerance Patient tolerated treatment well             Past Medical History:  Diagnosis Date   Allergy to environmental factors    Complication of anesthesia    "vageled" after surgery -overnite stay   Diverticulosis 2008   Heart murmur    hx of in childhood    History of chemotherapy    History of radiation therapy 11/05/10-12/26/10   r base tongue, 7000 cGy 35 sessions   Hypertension    Medication started in May 2016.   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   Tinnitus     Past Surgical History:  Procedure Laterality Date   APPENDECTOMY     COLONOSCOPY     INGUINAL HERNIA REPAIR Bilateral 03/30/2015   Procedure: LAPAROSCOPIC BILATERAL INGUINAL HERNIA REPAIR WITH MESH;  Surgeon: Abigail Miyamoto, MD;  [facility];  Service: General;  Laterality: Bilateral;   INGUINAL HERNIA REPAIR Right 1960   INGUINAL HERNIA REPAIR Bilateral 03/30/2015   INGUINAL HERNIA REPAIR Left 07/11/2015   INGUINAL HERNIA REPAIR N/A 07/11/2015   Procedure: REPAIR OF LEFT INGUINAL HERNIA WITH MESH;  Surgeon: Abigail Miyamoto, MD;  Location: [facility];  Service: General;  Laterality: N/A;   INSERTION OF MESH N/A 07/11/2015   Procedure: INSERTION OF MESH;  Surgeon: Abigail Miyamoto, MD;  Location[facility];  Service: General;  Laterality: N/A;   LUMBAR DISC SURGERY      TONSILLECTOMY      There were no vitals filed for this visit.   Subjective Assessment - 04/30/21 1105     Subjective "It got to where I was dehydrated"    Currently in Pain? No/denies                   ADULT SLP TREATMENT - 04/30/21 1105       General Information   Behavior/Cognition Alert;Cooperative;Pleasant mood      Treatment Provided   Treatment provided Dysphagia      Dysphagia Treatment   Temperature Spikes Noted No    Respiratory Status Room air    Oral Cavity - Dentition Adequate natural dentition    Treatment Methods Therapeutic exercise;Patient/caregiver education;Skilled observation;Compensation strategy training    Patient observed directly with PO's Yes    Type of PO's observed Thin liquids    Liquids provided via Cup    Pharyngeal Phase Signs & Symptoms Immediate throat clear;Wet vocal quality;Audible swallow;Multiple swallows;Immediate cough   immediate cough/throat clear are his swallow precautions,   Type of cueing Verbal    Amount of cueing Minimal    Other treatment/comments PO trials of thin liquids with 2 verbal cues not to breathe in before voluntary cough after swallow out of 8 swallows. He demonstrated multiple dry swallows  with mod I. With HEP, [name] completed Mendelson with rare min A. Masako required occasional mod A to instruct [name] not to complete Mendelson with Masako, as he was attempting to hold larynx up with Masako. CTAR completed with rare min A holding for 45 seconds. IMT completed 25x with excess vibration (duck call sound) 6/25x. Kept IMT at 25 as accuracy delcined at attempts at 30. EMT completed accurately with rare min A, again, kept EMT at same level as [name] reported effort level of 7-8/10. He reports doing HEP 1x a day and is more consistent with RMST twice a day.      Assessment / Recommendations / Plan   Plan Continue with current plan of care      Dysphagia Recommendations   Diet recommendations Regular;Thin liquid     Liquids provided via Cup    Medication Administration Whole meds with liquid    Supervision Patient able to self feed    Compensations Slow rate;Small sips/bites;Multiple dry swallows after each bite/sip;Hard cough after swallow;Effortful swallow   reduce distractions with meals   Postural Changes and/or Swallow Maneuvers Out of bed for meals;Seated upright 90 degrees;Upright 30-60 min after meal              SLP Education - 04/30/21 1146     Education Details HEP BID to TID, swallow precautions    Person(s) Educated Patient    Methods Explanation;Demonstration;Verbal cues;Handout    Comprehension Verbalized understanding;Returned demonstration;Verbal cues required;Need further instruction              SLP Short Term Goals - 04/30/21 1147       SLP SHORT TERM GOAL #1   Title Pt will follow swallow precautions with rare min A over 2 sessions    Status Not Met      SLP SHORT TERM GOAL #2   Title Pt will complete HEP for dysphagia with rare min A over 2 sessions    Baseline 04/02/21    Status Partially Met      SLP SHORT TERM GOAL #3   Title Pt will complete RMT HEP of 3 sets of 25 repetitions of IMT and EMT at 60-70% of MIP and MEP 5/7 days for 4 weeks    Status Not Met              SLP Long Term Goals - 04/30/21 1148       SLP LONG TERM GOAL #1   Title Pt will follow swallow precautions with mod I over 2 sessoins    Time 4    Period Weeks    Status On-going      SLP LONG TERM GOAL #2   Title Pt will complete HEP for dysphagia with mod I over 2 sessions    Time 4    Period Weeks    Status On-going      SLP LONG TERM GOAL #3   Title Pt will demonstrate improved airway protection, glottal closure by not contracting aspiration pna over 8 weeks.    Time 4    Period Weeks    Status On-going              Plan - 04/30/21 1147     Clinical Impression Statement [name] is demonstrating accurate completion of both EMT and IMT. He acknowledged today he will  complete his dysphagia HEP in it's entirety. SLP strongly stressed BID or TID as directed. Swallow precautions completed with  with initial mod fading to independent. Continue  skilled ST to improve airway protection and reduce risk of aspiration pna. He continues to deny any s/s of apsiration pna.    Speech Therapy Frequency 2x / week    Duration 8 weeks   17 visits   Treatment/Interventions Aspiration precaution training;Pharyngeal strengthening exercises;Diet toleration management by SLP;Trials of upgraded texture/liquids;Internal/external aids;Patient/family education;Compensatory strategies;Oral motor exercises;Environmental controls;SLP instruction and feedback;Other (comment)    Potential to Achieve Goals Fair    Potential Considerations Co-morbidities;Previous level of function;Severity of impairments             Patient will benefit from skilled therapeutic intervention in order to improve the following deficits and impairments:   Dysphagia, oropharyngeal phase    Problem List Patient Active Problem List   Diagnosis Date Noted   Chronic hyponatremia 02/20/2021   Dysphagia 12/12/2020   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   ACE-inhibitor cough 05/25/2015   Essential hypertension 03/21/2015   Allergic rhinitis 12/14/2013   ED (erectile dysfunction) 12/14/2013   Diverticulosis    History of oropharyngeal cancer 09/18/2010    Johnny Gorter, Radene Journey MS, CCC-SLP 04/30/2021, 11:49 AM  [facility] [address] [place], [place], [address] Phone: [phone]   Fax:  [phone]   Name: JORDANY WARTERS MRN: [id number] Date of Birth: Dec 15, 1950

## 2021-05-02 ENCOUNTER — Ambulatory Visit: Payer: Medicare Other | Admitting: Speech Pathology

## 2021-05-02 ENCOUNTER — Other Ambulatory Visit: Payer: Self-pay

## 2021-05-02 DIAGNOSIS — R471 Dysarthria and anarthria: Secondary | ICD-10-CM | POA: Diagnosis not present

## 2021-05-02 DIAGNOSIS — R1312 Dysphagia, oropharyngeal phase: Secondary | ICD-10-CM

## 2021-05-02 NOTE — Patient Instructions (Signed)
   Pool noodle also works for [employer], or a ball or towel, as long as it is 4 inches in diameter   Remember to focus on eating and following your swallow precautions even when you are at a restaurant or dining with family - safety first!  If you feel the need to cough, go ahead and cough big and for a while - don't try to stifle your cough even in public - it is your body's way of keep your lungs clear  Remember don't inhale after you swallow, cough first so you don't inhale any food residue in your pharynx  Any s/s of pna at the beach, head to urgent care for chest x ray (there won't be though)  Keep head neurtral when you are doing the Mendelson exercise - no need to extend your neck

## 2021-05-02 NOTE — Therapy (Signed)
initial mod fading to independent. Continue skilled ST to improve airway protection and reduce risk of aspiration pna. He continues to deny any s/s of apsiration pna.    Speech Therapy Frequency 1x /week    Duration 8 weeks   17 visits   Treatment/Interventions Aspiration precaution training;Pharyngeal strengthening exercises;Diet toleration management by SLP;Trials of upgraded texture/liquids;Internal/external aids;Patient/family education;Compensatory strategies;Oral motor exercises;Environmental controls;SLP instruction and feedback;Other (comment)    Potential to Achieve Goals Fair    Potential Considerations Co-morbidities;Previous level of function;Severity of impairments             Patient will benefit from skilled therapeutic intervention in order to improve the following deficits and impairments:   Dysphagia, oropharyngeal phase    Problem List Patient Active Problem List   Diagnosis Date Noted   Chronic hyponatremia 02/20/2021   Dysphagia 12/12/2020   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   ACE-inhibitor cough 05/25/2015   Essential hypertension 03/21/2015   Allergic rhinitis 12/14/2013   ED (erectile dysfunction) 12/14/2013   Diverticulosis    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 [place], [place], [address] Phone: [phone]   Fax:  [phone]   Name: Lee Barnes  Lee Barnes [address] [place], [place], [address] Phone: [phone]   Fax:  [phone]  Speech Language Pathology Treatment  Patient Details  Name: Lee Barnes Referring Provider (SLP): Dr. Jill Alexanders   Encounter Date: 05/02/2021   End of Session - 05/02/21 1157     Visit Number 9    Number of Visits 17    Date for SLP Re-Evaluation 05/16/21    Authorization Type none    SLP Start Time 1017    SLP Stop Time  1050    SLP Time Calculation (min) 33 min    Activity Tolerance Patient tolerated treatment well             Past Medical History:  Diagnosis Date   Allergy to environmental factors    Complication of anesthesia    "vageled" after surgery -overnite stay   Diverticulosis 2008   Heart murmur    hx of in childhood    History of chemotherapy    History of radiation therapy 11/05/10-12/26/10   r base tongue, 7000 cGy 35 sessions   Hypertension    Medication started in May 2016.   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   Pneumonia    hx of 2012   Squamous cell carcinoma    right base of tongue   Tinnitus     Past Surgical History:  Procedure Laterality Date   APPENDECTOMY     COLONOSCOPY     INGUINAL HERNIA REPAIR Bilateral 03/30/2015   Procedure: LAPAROSCOPIC BILATERAL INGUINAL HERNIA REPAIR WITH MESH;  Surgeon: Coralie Keens, MD;  Location: [facility];  Service: General;  Laterality: Bilateral;   INGUINAL HERNIA REPAIR Right [place] Bilateral 03/30/2015   INGUINAL HERNIA REPAIR Left 07/11/2015   INGUINAL HERNIA REPAIR N/A 07/11/2015   Procedure: REPAIR OF LEFT INGUINAL HERNIA WITH MESH;  Surgeon: Coralie Keens, MD;  Location: [facility];  Service: General;  Laterality: N/A;   INSERTION OF MESH N/A 07/11/2015   Procedure: INSERTION OF MESH;  Surgeon: Coralie Keens, MD;  Location: [facility];  Service: General;  Laterality: N/A;   LUMBAR  DISC SURGERY     TONSILLECTOMY      There were no vitals filed for this visit.   Subjective Assessment - 05/02/21 1021     Subjective "My grandkids are here, I've been tye-dying already this morning"    Currently in Pain? No/denies                   ADULT SLP TREATMENT - 05/02/21 1021       General Information   Behavior/Cognition Alert;Cooperative;Pleasant mood      Treatment Provided   Treatment provided Dysphagia      Dysphagia Treatment   Temperature Spikes Noted No    Respiratory Status Room air    Oral Cavity - Dentition Adequate natural dentition    Treatment Methods Therapeutic exercise;Patient/caregiver education;Skilled observation;Compensation strategy training    Patient observed directly with PO's Yes    Type of PO's observed Thin liquids    Feeding Able to feed self    Liquids provided via Cup    Pharyngeal Phase Signs & Symptoms Immediate throat clear;Wet vocal quality;Audible swallow;Multiple swallows;Delayed throat clear;Immediate cough   immediate throat clear/cough is a swallow precaution   Type of cueing Verbal    Amount of cueing Minimal    Other treatment/comments HEP completed with 1 cue to keep head  Lee Barnes [address] [place], [place], [address] Phone: [phone]   Fax:  [phone]  Speech Language Pathology Treatment  Patient Details  Name: Lee Barnes Referring Provider (SLP): Dr. Jill Alexanders   Encounter Date: 05/02/2021   End of Session - 05/02/21 1157     Visit Number 9    Number of Visits 17    Date for SLP Re-Evaluation 05/16/21    Authorization Type none    SLP Start Time 1017    SLP Stop Time  1050    SLP Time Calculation (min) 33 min    Activity Tolerance Patient tolerated treatment well             Past Medical History:  Diagnosis Date   Allergy to environmental factors    Complication of anesthesia    "vageled" after surgery -overnite stay   Diverticulosis 2008   Heart murmur    hx of in childhood    History of chemotherapy    History of radiation therapy 11/05/10-12/26/10   r base tongue, 7000 cGy 35 sessions   Hypertension    Medication started in May 2016.   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   Pneumonia    hx of 2012   Squamous cell carcinoma    right base of tongue   Tinnitus     Past Surgical History:  Procedure Laterality Date   APPENDECTOMY     COLONOSCOPY     INGUINAL HERNIA REPAIR Bilateral 03/30/2015   Procedure: LAPAROSCOPIC BILATERAL INGUINAL HERNIA REPAIR WITH MESH;  Surgeon: Coralie Keens, MD;  Location[facility];  Service: General;  Laterality: Bilateral;   INGUINAL HERNIA REPAIR Right [place] Bilateral 03/30/2015   INGUINAL HERNIA REPAIR Left 07/11/2015   INGUINAL HERNIA REPAIR N/A 07/11/2015   Procedure: REPAIR OF LEFT INGUINAL HERNIA WITH MESH;  Surgeon: Coralie Keens, MD;  Location: [facility];  Service: General;  Laterality: N/A;   INSERTION OF MESH N/A 07/11/2015   Procedure: INSERTION OF MESH;  Surgeon: Coralie Keens, MD;  Location: [facility];  Service: General;  Laterality: N/A;   LUMBAR  DISC SURGERY     TONSILLECTOMY      There were no vitals filed for this visit.   Subjective Assessment - 05/02/21 1021     Subjective "My grandkids are here, I've been tye-dying already this morning"    Currently in Pain? No/denies                   ADULT SLP TREATMENT - 05/02/21 1021       General Information   Behavior/Cognition Alert;Cooperative;Pleasant mood      Treatment Provided   Treatment provided Dysphagia      Dysphagia Treatment   Temperature Spikes Noted No    Respiratory Status Room air    Oral Cavity - Dentition Adequate natural dentition    Treatment Methods Therapeutic exercise;Patient/caregiver education;Skilled observation;Compensation strategy training    Patient observed directly with PO's Yes    Type of PO's observed Thin liquids    Feeding Able to feed self    Liquids provided via Cup    Pharyngeal Phase Signs & Symptoms Immediate throat clear;Wet vocal quality;Audible swallow;Multiple swallows;Delayed throat clear;Immediate cough   immediate throat clear/cough is a swallow precaution   Type of cueing Verbal    Amount of cueing Minimal    Other treatment/comments HEP completed with 1 cue to keep head

## 2021-05-03 DIAGNOSIS — M8448XD Pathological fracture, other site, subsequent encounter for fracture with routine healing: Secondary | ICD-10-CM | POA: Diagnosis not present

## 2021-05-04 DIAGNOSIS — M71571 Other bursitis, not elsewhere classified, right ankle and foot: Secondary | ICD-10-CM | POA: Diagnosis not present

## 2021-05-04 DIAGNOSIS — M21621 Bunionette of right foot: Secondary | ICD-10-CM | POA: Diagnosis not present

## 2021-05-04 DIAGNOSIS — M71572 Other bursitis, not elsewhere classified, left ankle and foot: Secondary | ICD-10-CM | POA: Diagnosis not present

## 2021-05-04 DIAGNOSIS — M21622 Bunionette of left foot: Secondary | ICD-10-CM | POA: Diagnosis not present

## 2021-05-07 ENCOUNTER — Ambulatory Visit: Payer: Medicare Other | Admitting: Speech Pathology

## 2021-05-07 ENCOUNTER — Other Ambulatory Visit: Payer: Self-pay

## 2021-05-07 ENCOUNTER — Encounter: Payer: Self-pay | Admitting: Speech Pathology

## 2021-05-07 DIAGNOSIS — R471 Dysarthria and anarthria: Secondary | ICD-10-CM | POA: Diagnosis not present

## 2021-05-07 DIAGNOSIS — R1312 Dysphagia, oropharyngeal phase: Secondary | ICD-10-CM | POA: Diagnosis not present

## 2021-05-07 NOTE — Therapy (Signed)
[facility] [address] [place], [place], [address] Phone: [phone]   Fax:  [phone]  Speech Language Pathology Treatment  Patient Details  Name: Lee Barnes MRN: [id number] Date of Birth: June 05, 1951 Referring Provider (SLP): Dr. Sharlot Gowda   Encounter Date: 05/07/2021   End of Session - 05/07/21 1539     Visit Number 10    Number of Visits 17    Date for SLP Re-Evaluation 05/16/21    SLP Start Time 1450    SLP Stop Time  1530    SLP Time Calculation (min) 40 min    Activity Tolerance Patient tolerated treatment well             Past Medical History:  Diagnosis Date   Allergy to environmental factors    Complication of anesthesia    "vageled" after surgery -overnite stay   Diverticulosis 2008   Heart murmur    hx of in childhood    History of chemotherapy    History of radiation therapy 11/05/10-12/26/10   r base tongue, 7000 cGy 35 sessions   Hypertension    Medication started in May 2016.   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   Tinnitus     Past Surgical History:  Procedure Laterality Date   APPENDECTOMY     COLONOSCOPY     INGUINAL HERNIA REPAIR Bilateral 03/30/2015   Procedure: LAPAROSCOPIC BILATERAL INGUINAL HERNIA REPAIR WITH MESH;  Surgeon: Abigail Miyamoto, MD;  Location: [facility];  Service: General;  Laterality: Bilateral;   INGUINAL HERNIA REPAIR Right 1960   INGUINAL HERNIA REPAIR Bilateral 03/30/2015   INGUINAL HERNIA REPAIR Left 07/11/2015   INGUINAL HERNIA REPAIR N/A 07/11/2015   Procedure: REPAIR OF LEFT INGUINAL HERNIA WITH MESH;  Surgeon: Abigail Miyamoto, MD;  Location[facility];  Service: General;  Laterality: N/A;   INSERTION OF MESH N/A 07/11/2015   Procedure: INSERTION OF MESH;  Surgeon: Abigail Miyamoto, MD;  Location: [facility];  Service: General;  Laterality: N/A;   LUMBAR DISC SURGERY      TONSILLECTOMY      There were no vitals filed for this visit.   Subjective Assessment - 05/07/21 1456     Subjective "I've been doing al right"    Currently in Pain? No/denies                   ADULT SLP TREATMENT - 05/07/21 1456       General Information   Behavior/Cognition Alert;Cooperative;Pleasant mood      Treatment Provided   Treatment provided Dysphagia      Dysphagia Treatment   Temperature Spikes Noted No    Respiratory Status Room air    Oral Cavity - Dentition Adequate natural dentition    Treatment Methods Therapeutic exercise;Patient/caregiver education;Skilled observation;Compensation strategy training    Patient observed directly with PO's Yes    Type of PO's observed Thin liquids    Feeding Able to feed self    Liquids provided via Cup    Pharyngeal Phase Signs & Symptoms Immediate throat clear;Wet vocal quality;Audible swallow;Multiple swallows;Delayed throat clear;Immediate cough    Type of cueing Verbal    Amount of cueing Modified independent    Other treatment/comments EMST and IMST completed with rare min A. Lee Barnes continues to report mild fatigue/more effort required as repetitions increase. Increased IMT frm 25 to 27 and  EMT from 30 to 45. Lee Barnes stated this was at 7/10 effort. He will see how he feels with slightly higher settings. HEP for dysphagia with mod I. Lee Barnes has found a prior exercise sheet from past course of therapy. He is adding in some of those exercises to - observed with those and answered his questions re: past exercises. He continues to expirience mild nasal reguritation.      Assessment / Recommendations / Plan   Plan Continue with current plan of care      Dysphagia Recommendations   Diet recommendations Regular;Thin liquid    Liquids provided via Cup    Medication Administration Whole meds with liquid    Supervision Patient able to self feed    Compensations Slow rate;Small sips/bites;Multiple dry swallows after each  bite/sip;Hard cough after swallow;Effortful swallow    Postural Changes and/or Swallow Maneuvers Out of bed for meals;Seated upright 90 degrees;Upright 30-60 min after meal      Progression Toward Goals   Progression toward goals Progressing toward goals              SLP Education - 05/07/21 1537     Education Details Biotene gel for xerostomia;    Person(s) Educated Patient    Methods Explanation;Demonstration;Verbal cues;Handout    Comprehension Verbalized understanding;Returned demonstration            Speech Therapy Progress Note  Dates of Reporting Period: 03/21/21 to 05/07/21  Objective Reports of Subjective Statement: Pt denies any s/s pna - managing current diet with swallow precautions  Objective Measurements: Pt has improved to be mod I with HEP and rare min A with RMST  Goal Update: continue goals  Plan: continue POC  Reason Skilled Services are Required: Pt has been hospitalized 2x with aspiration pna this past spring and 1x with dehydration. Ongoing ST for accurate completion of HEP for dysphagia, RMST and strict adherence to swallow precautions to reduce risk of hospitalization/aspiration pna      SLP Short Term Goals - 05/07/21 1538       SLP SHORT TERM GOAL #1   Title Pt will follow swallow precautions with rare min A over 2 sessions    Status Not Met      SLP SHORT TERM GOAL #2   Title Pt will complete HEP for dysphagia with rare min A over 2 sessions    Baseline 04/02/21    Status Partially Met      SLP SHORT TERM GOAL #3   Title Pt will complete RMT HEP of 3 sets of 25 repetitions of IMT and EMT at 60-70% of MIP and MEP 5/7 days for 4 weeks    Status Not Met              SLP Long Term Goals - 05/07/21 1538       SLP LONG TERM GOAL #1   Title Pt will follow swallow precautions with mod I over 2 sessoins    Baseline 05/02/21, 05/07/21    Time 4    Period Weeks    Status Achieved      SLP LONG TERM GOAL #2   Title Pt will complete  HEP for dysphagia with mod I over 2 sessions    Baseline 05/02/21, 05/07/21    Time 4    Period Weeks    Status Achieved      SLP LONG TERM GOAL #3   Title Pt will demonstrate improved airway protection, glottal closure by not contracting aspiration pna  over 8 weeks.    Time 3    Period Weeks    Status On-going              Plan - 05/07/21 1537     Clinical Impression Statement Lee Barnes is demonstrating accurate completion of both EMT and IMT. He acknowledged today he will complete his dysphagia HEP in it's entirety. SLP strongly stressed BID or TID as directed. Swallow precautions completed with  with initial mod fading to independent. Continue skilled ST to improve airway protection and reduce risk of aspiration pna. He continues to deny any s/s of apsiration pna.    Speech Therapy Frequency 2x / week    Duration 8 weeks   17 visits   Treatment/Interventions Aspiration precaution training;Pharyngeal strengthening exercises;Diet toleration management by SLP;Trials of upgraded texture/liquids;Internal/external aids;Patient/family education;Compensatory strategies;Oral motor exercises;Environmental controls;SLP instruction and feedback;Other (comment)    Potential to Achieve Goals Fair    Potential Considerations Co-morbidities;Previous level of function;Severity of impairments             Patient will benefit from skilled therapeutic intervention in order to improve the following deficits and impairments:   Dysphagia, oropharyngeal phase    Problem List Patient Active Problem List   Diagnosis Date Noted   Chronic hyponatremia 02/20/2021   Dysphagia 12/12/2020   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   ACE-inhibitor cough 05/25/2015   Essential hypertension 03/21/2015   Allergic rhinitis 12/14/2013   ED (erectile dysfunction) 12/14/2013   Diverticulosis    History of oropharyngeal cancer 09/18/2010    Vadis Slabach, Radene Journey  MS, CCC-SLP 05/07/2021, 3:41 PM  [facility] [address] [place], [place], [address] Phone: [phone]   Fax:  [phone]   Name: Lee Barnes MRN: [id number] Date of Birth: [birth year]

## 2021-05-07 NOTE — Patient Instructions (Signed)
  If you are getting dry mouth at night, consider Biotene Gel that you rub on your tongue, inner cheeks and lips right before bed. It is thicker than the mouthwash and stays in rather than being spit out  It's OK if you can't say GA with your tongue out, just focus on getting a good strong G sound  Keep up the good work with the exercises

## 2021-05-14 ENCOUNTER — Other Ambulatory Visit: Payer: Self-pay

## 2021-05-14 ENCOUNTER — Ambulatory Visit: Payer: Medicare Other

## 2021-05-14 ENCOUNTER — Other Ambulatory Visit: Payer: Medicare Other

## 2021-05-14 ENCOUNTER — Telehealth: Payer: Self-pay

## 2021-05-14 DIAGNOSIS — R1312 Dysphagia, oropharyngeal phase: Secondary | ICD-10-CM | POA: Diagnosis not present

## 2021-05-14 DIAGNOSIS — R471 Dysarthria and anarthria: Secondary | ICD-10-CM | POA: Diagnosis not present

## 2021-05-14 DIAGNOSIS — E871 Hypo-osmolality and hyponatremia: Secondary | ICD-10-CM | POA: Diagnosis not present

## 2021-05-14 DIAGNOSIS — R1311 Dysphagia, oral phase: Secondary | ICD-10-CM

## 2021-05-14 LAB — BASIC METABOLIC PANEL
BUN/Creatinine Ratio: 14 (ref 10–24)
BUN: 14 mg/dL (ref 8–27)
CO2: 25 mmol/L (ref 20–29)
Calcium: 9.1 mg/dL (ref 8.6–10.2)
Chloride: 94 mmol/L — ABNORMAL LOW (ref 96–106)
Creatinine, Ser: 0.98 mg/dL (ref 0.76–1.27)
Glucose: 90 mg/dL (ref 65–99)
Potassium: 5.1 mmol/L (ref 3.5–5.2)
Sodium: 128 mmol/L — ABNORMAL LOW (ref 134–144)
eGFR: 83 mL/min/{1.73_m2} (ref >59)

## 2021-05-14 NOTE — Telephone Encounter (Signed)
Error

## 2021-05-15 NOTE — Therapy (Signed)
[facility] [address] [place], [place], [address] Phone: [phone]   Fax:  [phone]  Speech Language Pathology Treatment  Patient Details  Name: Lee Barnes MRN: [id number] Date of Birth: [birth year] Referring Provider (SLP): Dr. Sharlot Gowda   Encounter Date: 05/14/2021   End of Session - 05/15/21 0100     Visit Number 11    Number of Visits 17    Date for SLP Re-Evaluation 05/16/21    SLP Start Time 1150    SLP Stop Time  1230    SLP Time Calculation (min) 40 min    Activity Tolerance Patient tolerated treatment well             Past Medical History:  Diagnosis Date   Allergy to environmental factors    Complication of anesthesia    "vageled" after surgery -overnite stay   Diverticulosis 2008   Heart murmur    hx of in childhood    History of chemotherapy    History of radiation therapy 11/05/10-12/26/10   r base tongue, 7000 cGy 35 sessions   Hypertension    Medication started in May 2016.   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;  Service: General;  Laterality: N/A;   INSERTION OF MESH N/A 07/11/2015   Procedure: INSERTION OF MESH;  Surgeon: Abigail Miyamoto, MD;  Location: [facility];  Service: General;  Laterality: N/A;   LUMBAR DISC SURGERY      TONSILLECTOMY      There were no vitals filed for this visit.   Subjective Assessment - 05/14/21 1152     Subjective "I'm doing all I can to get the sets in once a day (with company at pt's home)." Pt got HEP done BID once in the last week.    Currently in Pain? No/denies                   ADULT SLP TREATMENT - 05/15/21 0001       General Information   Behavior/Cognition Alert;Cooperative;Pleasant mood      Treatment Provided   Treatment provided Dysphagia      Dysphagia Treatment   Respiratory Status Room air    Oral Cavity - Dentition Adequate natural dentition    Type of PO's observed Thin liquids    Liquids provided via Cup    Pharyngeal Phase Signs & Symptoms Immediate cough   x1/10   Other treatment/comments "I have been making more of the duck call sounds with the inhale one." Pt was doing this - and SLP told him to use nose piece as air was coming in through nasal cavity.Pt was also not exhaling completely prior to inhalation so SLP cued him for this. PT also with  "Duck call" sound at the end  of each production sets 4 and 5; only present on last 1-2 reps for first 3 sets. Effort level was 8/10. With EMST, pt experienced some labial weakness on rt during the last set of 5, but had good sound for approx 80% of these. Effort level was more in the last two sets than in the first three - pt agreed with this, with level of 7-8/10. Pt states he was out of his routine the past week due to grandchildren being in town - pt completed with rare min-mod A.      Assessment / Recommendations / Plan   Plan Continue with current plan of care      Dysphagia Recommendations   Diet recommendations Regular;Thin liquid    Liquids provided via Cup    Medication Administration Whole meds with liquid    Supervision Patient able to self feed    Compensations Slow rate;Small sips/bites;Multiple dry swallows after each bite/sip;Hard cough after swallow;Effortful swallow    Postural  Changes and/or Swallow Maneuvers Out of bed for meals;Seated upright 90 degrees;Upright 30-60 min after meal      Progression Toward Goals   Progression toward goals Progressing toward goals                SLP Short Term Goals - 05/07/21 1538       SLP SHORT TERM GOAL #1   Title Pt will follow swallow precautions with rare min A over 2 sessions    Status Not Met      SLP SHORT TERM GOAL #2   Title Pt will complete HEP for dysphagia with rare min A over 2 sessions    Baseline 04/02/21    Status Partially Met      SLP SHORT TERM GOAL #3   Title Pt will complete RMT HEP of 3 sets of 25 repetitions of IMT and EMT at 60-70% of MIP and MEP 5/7 days for 4 weeks    Status Not Met              SLP Long Term Goals - 05/15/21 0101       SLP LONG TERM GOAL #1   Title Pt will follow swallow precautions with mod I over 2 sessoins    Baseline 05/02/21, 05/07/21    Status Achieved      SLP LONG TERM GOAL #2   Title Pt will complete HEP for dysphagia with mod I over 2 sessions    Baseline 05/02/21, 05/07/21    Status Achieved      SLP LONG TERM GOAL #3   Title Pt will demonstrate improved airway protection, glottal closure by not contracting aspiration pna over 8 weeks.    Time 3    Period Weeks    Status On-going              Plan - 05/15/21 0100     Clinical Impression Statement [name] is demonstrating accurate completion of both EMT and IMT. He acknowledged today he will complete his dysphagia HEP in it's entirety. SLP strongly stressed BID or TID as directed. Swallow precautions completed with  with initial mod fading to independent. Continue skilled ST to improve airway protection and reduce risk of aspiration pna. He continues to deny any s/s of apsiration pna.    Speech Therapy Frequency 2x / week    Duration 8 weeks   17 visits   Treatment/Interventions Aspiration precaution training;Pharyngeal strengthening exercises;Diet toleration management by SLP;Trials of  upgraded texture/liquids;Internal/external aids;Patient/family education;Compensatory strategies;Oral motor  exercises;Environmental controls;SLP instruction and feedback;Other (comment)    Potential to Achieve Goals Fair    Potential Considerations Co-morbidities;Previous level of function;Severity of impairments             Patient will benefit from skilled therapeutic intervention in order to improve the following deficits and impairments:   Dysphagia, oropharyngeal phase  Dysarthria and anarthria    Problem List Patient Active Problem List   Diagnosis Date Noted   Chronic hyponatremia 02/20/2021   Dysphagia 12/12/2020   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   ACE-inhibitor cough 05/25/2015   Essential hypertension 03/21/2015   Allergic rhinitis 12/14/2013   ED (erectile dysfunction) 12/14/2013   Diverticulosis    History of oropharyngeal cancer 09/18/2010    [facility] ,MS, CCC-SLP  05/15/2021, 1:02 AM  [facility] [address] [place], [place], [address] Phone: [phone]   Fax:  [phone]   Name: PIYUSH SALAIZ MRN: [id number] Date of Birth: 03-15-1951

## 2021-05-23 ENCOUNTER — Other Ambulatory Visit: Payer: Self-pay

## 2021-05-23 ENCOUNTER — Telehealth (INDEPENDENT_AMBULATORY_CARE_PROVIDER_SITE_OTHER): Payer: Medicare Other | Admitting: Family Medicine

## 2021-05-23 ENCOUNTER — Encounter: Payer: Self-pay | Admitting: Family Medicine

## 2021-05-23 VITALS — Ht 75.0 in | Wt 157.0 lb

## 2021-05-23 DIAGNOSIS — E871 Hypo-osmolality and hyponatremia: Secondary | ICD-10-CM | POA: Diagnosis not present

## 2021-05-23 NOTE — Progress Notes (Signed)
Subjective:    Patient ID: Lee Barnes, male    DOB: November 11, 1951, 70 y.o.   MRN: [id number]  HPI Documentation for virtual audio and video telecommunications through Caregility encounter: The patient was located at home. 2 patient identifiers used.  The provider was located in the office. The patient did consent to this visit and is aware of possible charges through their insurance for this visit. The other persons participating in this telemedicine service was his wife. Time spent on call was 5 minutes and in review of previous records >20 minutes total for counseling and coordination of care. This virtual service is not related to other E/M service within previous 7 days.  He has a previous history of chronic difficulty with hyponatremia requiring several admissions.  He does see a specialist in the [place] area to help with this.  His most recent blood work did show sodium of 128.  We have all come to the conclusion that this needs to be watched carefully to keep him from becoming hyponatremic requiring admission.  Because of this I will go ahead and set him up for blood work on July 8.  Review of Systems     Objective:   Physical Exam  Alert and in no distress otherwise not examined.  His wife was present for the encounter.      Assessment & Plan:  Chronic hyponatremia - Plan: Basic Metabolic Panel

## 2021-05-25 ENCOUNTER — Other Ambulatory Visit: Payer: Self-pay

## 2021-05-25 ENCOUNTER — Other Ambulatory Visit: Payer: Medicare Other

## 2021-05-25 DIAGNOSIS — E871 Hypo-osmolality and hyponatremia: Secondary | ICD-10-CM | POA: Diagnosis not present

## 2021-05-25 LAB — BASIC METABOLIC PANEL
BUN/Creatinine Ratio: 13 (ref 10–24)
BUN: 18 mg/dL (ref 8–27)
CO2: 21 mmol/L (ref 20–29)
Calcium: 9.5 mg/dL (ref 8.6–10.2)
Chloride: 92 mmol/L — ABNORMAL LOW (ref 96–106)
Creatinine, Ser: 1.34 mg/dL — ABNORMAL HIGH (ref 0.76–1.27)
Glucose: 91 mg/dL (ref 65–99)
Potassium: 5 mmol/L (ref 3.5–5.2)
Sodium: 130 mmol/L — ABNORMAL LOW (ref 134–144)
eGFR: 57 mL/min/{1.73_m2} — ABNORMAL LOW (ref >59)

## 2021-05-29 ENCOUNTER — Telehealth: Payer: Self-pay | Admitting: Family Medicine

## 2021-05-29 DIAGNOSIS — I129 Hypertensive chronic kidney disease with stage 1 through stage 4 chronic kidney disease, or unspecified chronic kidney disease: Secondary | ICD-10-CM | POA: Diagnosis not present

## 2021-05-29 DIAGNOSIS — Z85810 Personal history of malignant neoplasm of tongue: Secondary | ICD-10-CM | POA: Diagnosis not present

## 2021-05-29 DIAGNOSIS — J189 Pneumonia, unspecified organism: Secondary | ICD-10-CM | POA: Diagnosis not present

## 2021-05-29 DIAGNOSIS — R1313 Dysphagia, pharyngeal phase: Secondary | ICD-10-CM | POA: Diagnosis not present

## 2021-05-29 DIAGNOSIS — N182 Chronic kidney disease, stage 2 (mild): Secondary | ICD-10-CM | POA: Diagnosis not present

## 2021-05-29 DIAGNOSIS — J690 Pneumonitis due to inhalation of food and vomit: Secondary | ICD-10-CM | POA: Diagnosis not present

## 2021-05-29 DIAGNOSIS — M272 Inflammatory conditions of jaws: Secondary | ICD-10-CM | POA: Diagnosis not present

## 2021-05-29 DIAGNOSIS — E871 Hypo-osmolality and hyponatremia: Secondary | ICD-10-CM | POA: Diagnosis not present

## 2021-05-29 NOTE — Telephone Encounter (Signed)
Wife called.  [place] the [place]. Patient complains of being cold, can't get warm, trembly. Doesn't have a thermometer.  Wife recalls him having similar symptoms when had pneumonia in the past. No change in cough, not coughing up blood.  Advice given (discussed UC vs ER, can get thermometer from pharmacy, give a dose of tylenol, see how he feels, and can use that to decide on which location to seek care).

## 2021-05-30 ENCOUNTER — Telehealth: Payer: Self-pay

## 2021-05-30 DIAGNOSIS — Z85810 Personal history of malignant neoplasm of tongue: Secondary | ICD-10-CM | POA: Diagnosis not present

## 2021-05-30 DIAGNOSIS — R1310 Dysphagia, unspecified: Secondary | ICD-10-CM | POA: Diagnosis not present

## 2021-05-30 DIAGNOSIS — R1313 Dysphagia, pharyngeal phase: Secondary | ICD-10-CM | POA: Diagnosis not present

## 2021-05-30 DIAGNOSIS — N182 Chronic kidney disease, stage 2 (mild): Secondary | ICD-10-CM | POA: Diagnosis not present

## 2021-05-30 DIAGNOSIS — J690 Pneumonitis due to inhalation of food and vomit: Secondary | ICD-10-CM | POA: Diagnosis not present

## 2021-05-30 DIAGNOSIS — M272 Inflammatory conditions of jaws: Secondary | ICD-10-CM | POA: Diagnosis not present

## 2021-05-30 DIAGNOSIS — I129 Hypertensive chronic kidney disease with stage 1 through stage 4 chronic kidney disease, or unspecified chronic kidney disease: Secondary | ICD-10-CM | POA: Diagnosis not present

## 2021-05-30 DIAGNOSIS — E871 Hypo-osmolality and hyponatremia: Secondary | ICD-10-CM | POA: Diagnosis not present

## 2021-05-30 DIAGNOSIS — R918 Other nonspecific abnormal finding of lung field: Secondary | ICD-10-CM | POA: Diagnosis not present

## 2021-05-30 DIAGNOSIS — T17228S Food in pharynx causing other injury, sequela: Secondary | ICD-10-CM | POA: Diagnosis not present

## 2021-05-30 NOTE — Telephone Encounter (Signed)
Pt. Aware of your recommendations. He stated they ended up staying there and they final got him in for the swallowing test and is getting a full work up there now.

## 2021-05-30 NOTE — Telephone Encounter (Signed)
Pt. Wife called stating that they are at the hospital in [facility] in [place]. Lee Barnes sodium level was 127 when she brought him in there yesterday they checked again a few hours ago haven't got the results yet. They think he has a bacterial pneumonia rt. Lung with aspiration. They told him he can't eat or drink right now. She wants to know if they should stay at the current hospital they are at or try to come home and go to a hospital in this area. They don't seem to be given any thing to eat or drink thru fluids or IV. He has been there since yesterday at 5 and they are waiting to do a swallowing test there. They tested him for covid and that was negative.

## 2021-05-31 DIAGNOSIS — M272 Inflammatory conditions of jaws: Secondary | ICD-10-CM | POA: Diagnosis present

## 2021-05-31 DIAGNOSIS — K2951 Unspecified chronic gastritis with bleeding: Secondary | ICD-10-CM | POA: Diagnosis not present

## 2021-05-31 DIAGNOSIS — J690 Pneumonitis due to inhalation of food and vomit: Secondary | ICD-10-CM | POA: Diagnosis present

## 2021-05-31 DIAGNOSIS — K2931 Chronic superficial gastritis with bleeding: Secondary | ICD-10-CM | POA: Diagnosis not present

## 2021-05-31 DIAGNOSIS — I129 Hypertensive chronic kidney disease with stage 1 through stage 4 chronic kidney disease, or unspecified chronic kidney disease: Secondary | ICD-10-CM | POA: Diagnosis present

## 2021-05-31 DIAGNOSIS — R1313 Dysphagia, pharyngeal phase: Secondary | ICD-10-CM | POA: Diagnosis present

## 2021-05-31 DIAGNOSIS — Z85810 Personal history of malignant neoplasm of tongue: Secondary | ICD-10-CM | POA: Diagnosis not present

## 2021-05-31 DIAGNOSIS — E871 Hypo-osmolality and hyponatremia: Secondary | ICD-10-CM | POA: Diagnosis present

## 2021-05-31 DIAGNOSIS — N182 Chronic kidney disease, stage 2 (mild): Secondary | ICD-10-CM | POA: Diagnosis present

## 2021-05-31 DIAGNOSIS — R1310 Dysphagia, unspecified: Secondary | ICD-10-CM | POA: Diagnosis not present

## 2021-05-31 DIAGNOSIS — C029 Malignant neoplasm of tongue, unspecified: Secondary | ICD-10-CM | POA: Diagnosis not present

## 2021-06-04 ENCOUNTER — Other Ambulatory Visit: Payer: Self-pay

## 2021-06-04 ENCOUNTER — Ambulatory Visit: Payer: Medicare Other | Attending: Family Medicine

## 2021-06-04 DIAGNOSIS — R1312 Dysphagia, oropharyngeal phase: Secondary | ICD-10-CM | POA: Diagnosis not present

## 2021-06-04 DIAGNOSIS — R471 Dysarthria and anarthria: Secondary | ICD-10-CM | POA: Insufficient documentation

## 2021-06-04 NOTE — Therapy (Signed)
[facility] [address] [place], [place], [address] Phone: [phone]   Fax:  [phone]  Speech Language Pathology Treatment/Recertification  Patient Details  Name: Lee Barnes MRN: [id number] Date of Birth: 09-01-1951 Referring Provider (SLP): Dr. Jill Alexanders   Encounter Date: 06/04/2021   End of Session - 06/04/21 1146     Visit Number 12    Number of Visits 17    Date for SLP Re-Evaluation 07/06/21    SLP Start Time 1021    SLP Stop Time  1101    SLP Time Calculation (min) 40 min    Activity Tolerance Patient tolerated treatment well             Past Medical History:  Diagnosis Date   Allergy to environmental factors    Complication of anesthesia    "vageled" after surgery -overnite stay   Diverticulosis 2008   Heart murmur    hx of in childhood    History of chemotherapy    History of radiation therapy 11/05/10-12/26/10   r base tongue, 7000 cGy 35 sessions   Hypertension    Medication started in May 2016.   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   Tinnitus     Past Surgical History:  Procedure Laterality Date   APPENDECTOMY     COLONOSCOPY     INGUINAL HERNIA REPAIR Bilateral 03/30/2015   Procedure: LAPAROSCOPIC BILATERAL INGUINAL HERNIA REPAIR WITH MESH;  Surgeon: Coralie Keens, MD;  Location: [facility];  Service: General;  Laterality: Bilateral;   INGUINAL HERNIA REPAIR Right [place] Bilateral 03/30/2015   INGUINAL HERNIA REPAIR Left 07/11/2015   INGUINAL HERNIA REPAIR N/A 07/11/2015   Procedure: REPAIR OF LEFT INGUINAL HERNIA WITH MESH;  Surgeon: Coralie Keens, MD;  Location: [facility];  Service: General;  Laterality: N/A;   INSERTION OF MESH N/A 07/11/2015   Procedure: INSERTION OF MESH;  Surgeon: Coralie Keens, MD;  Location: [facility];  Service: General;  Laterality: N/A;   LUMBAR DISC  SURGERY     TONSILLECTOMY      There were no vitals filed for this visit.   Subjective Assessment - 06/04/21 1135     Subjective Pt was in the hospital near [facility] last week (got out Friday) for 3-4 days due to aspiration PNA. Had (what seems to be) a MBSS and a PEG was placed.    Currently in Pain? No/denies                   ADULT SLP TREATMENT - 06/04/21 1137       General Information   Behavior/Cognition Alert;Cooperative;Pleasant mood      Treatment Provided   Treatment provided Dysphagia      Dysphagia Treatment   Other treatment/comments Pt wanted to know "how best to make progress and get this (PEG) out." SLP told pt HEP was the best way to do that.Lee Barnes expressed frustration at the time it was taking to complete the HEP - SLP gave rep ranges for all exercises where pt has to swallow for "15-20" reps instead of 20 reps (effortful, masako, mendelsohn), and explained to pt how to cycle through the HEP instead of performing the HEP straight through. Pt endorsed not being able to complete the whole HEP at times due to fatigue - SLP told pt this cycling through should  Plan - 06/04/21 1147     Clinical Impression Statement Lee Barnes is demonstrating accurate completion of both EMT and IMT. He was hospitalized last week for  aspiration PNA, per pt. SLP attempting to obtain report of objective swallow eval. Pt now placed with PEG. He acknowledged today he will complete his dysphagia HEP in it's entirety, cycling through HEP instead of copmleting straight through. SLP strongly stressed BID. Continue skilled ST to improve airway protection and reduce risk of aspiration pna.    Speech Therapy Frequency 2x / week    Duration 5 weeks      Treatment/Interventions Aspiration precaution training;Pharyngeal strengthening exercises;Diet toleration management by SLP;Trials of upgraded texture/liquids;Internal/external aids;Patient/family education;Compensatory strategies;Oral motor exercises;Environmental controls;SLP instruction and feedback;Other (comment)    Potential to Achieve Goals Fair    Potential Considerations Co-morbidities;Previous level of function;Severity of impairments             Patient will benefit from skilled therapeutic intervention in order to improve the following deficits and impairments:   Dysphagia, oropharyngeal phase  Dysarthria and anarthria    Problem List Patient Active Problem List   Diagnosis Date Noted   Chronic hyponatremia 02/20/2021   Dysphagia 12/12/2020   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 ,[address]  06/04/2021, 1:06 PM  [facility] [address] [place], [place], [address] Phone: [phone]   Fax:  [phone]   Name: Lee Barnes MRN: [id number] Date of Birth: Dec 14, 1950  [facility] [address] [place], [place], [address] Phone: [phone]   Fax:  [phone]  Speech Language Pathology Treatment/Recertification  Patient Details  Name: Lee Barnes MRN: [id number] Date of Birth: 09-01-1951 Referring Provider (SLP): Dr. Jill Alexanders   Encounter Date: 06/04/2021   End of Session - 06/04/21 1146     Visit Number 12    Number of Visits 17    Date for SLP Re-Evaluation 07/06/21    SLP Start Time 1021    SLP Stop Time  1101    SLP Time Calculation (min) 40 min    Activity Tolerance Patient tolerated treatment well             Past Medical History:  Diagnosis Date   Allergy to environmental factors    Complication of anesthesia    "vageled" after surgery -overnite stay   Diverticulosis 2008   Heart murmur    hx of in childhood    History of chemotherapy    History of radiation therapy 11/05/10-12/26/10   r base tongue, 7000 cGy 35 sessions   Hypertension    Medication started in May 2016.   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   Tinnitus     Past Surgical History:  Procedure Laterality Date   APPENDECTOMY     COLONOSCOPY     INGUINAL HERNIA REPAIR Bilateral 03/30/2015   Procedure: LAPAROSCOPIC BILATERAL INGUINAL HERNIA REPAIR WITH MESH;  Surgeon: Coralie Keens, MD;  Location: [facility];  Service: General;  Laterality: Bilateral;   INGUINAL HERNIA REPAIR Right [place] Bilateral 03/30/2015   INGUINAL HERNIA REPAIR Left 07/11/2015   INGUINAL HERNIA REPAIR N/A 07/11/2015   Procedure: REPAIR OF LEFT INGUINAL HERNIA WITH MESH;  Surgeon: Coralie Keens, MD;  Location: [facility];  Service: General;  Laterality: N/A;   INSERTION OF MESH N/A 07/11/2015   Procedure: INSERTION OF MESH;  Surgeon: Coralie Keens, MD;  Location: [facility];  Service: General;  Laterality: N/A;   LUMBAR DISC  SURGERY     TONSILLECTOMY      There were no vitals filed for this visit.   Subjective Assessment - 06/04/21 1135     Subjective Pt was in the hospital near [facility] last week (got out Friday) for 3-4 days due to aspiration PNA. Had (what seems to be) a MBSS and a PEG was placed.    Currently in Pain? No/denies                   ADULT SLP TREATMENT - 06/04/21 1137       General Information   Behavior/Cognition Alert;Cooperative;Pleasant mood      Treatment Provided   Treatment provided Dysphagia      Dysphagia Treatment   Other treatment/comments Pt wanted to know "how best to make progress and get this (PEG) out." SLP told pt HEP was the best way to do that.Lee Barnes expressed frustration at the time it was taking to complete the HEP - SLP gave rep ranges for all exercises where pt has to swallow for "15-20" reps instead of 20 reps (effortful, masako, mendelsohn), and explained to pt how to cycle through the HEP instead of performing the HEP straight through. Pt endorsed not being able to complete the whole HEP at times due to fatigue - SLP told pt this cycling through should

## 2021-06-04 NOTE — Addendum Note (Signed)
Addended by: Garald Balding B on: 06/04/2021 01:10 PM   Modules accepted: Orders

## 2021-06-04 NOTE — Patient Instructions (Signed)
  Make multiple cycles through the exercises instead of completing the full set of reps for each - you may need a tally sheet to count the reps for each exercise.

## 2021-06-05 ENCOUNTER — Ambulatory Visit (INDEPENDENT_AMBULATORY_CARE_PROVIDER_SITE_OTHER): Payer: Medicare Other | Admitting: Family Medicine

## 2021-06-05 ENCOUNTER — Encounter: Payer: Self-pay | Admitting: Family Medicine

## 2021-06-05 VITALS — BP 136/82 | HR 86 | Temp 98.1°F | Wt 154.8 lb

## 2021-06-05 DIAGNOSIS — R1311 Dysphagia, oral phase: Secondary | ICD-10-CM

## 2021-06-05 DIAGNOSIS — Z8701 Personal history of pneumonia (recurrent): Secondary | ICD-10-CM

## 2021-06-05 DIAGNOSIS — Z85819 Personal history of malignant neoplasm of unspecified site of lip, oral cavity, and pharynx: Secondary | ICD-10-CM | POA: Diagnosis not present

## 2021-06-05 DIAGNOSIS — Z931 Gastrostomy status: Secondary | ICD-10-CM

## 2021-06-05 DIAGNOSIS — E871 Hypo-osmolality and hyponatremia: Secondary | ICD-10-CM | POA: Diagnosis not present

## 2021-06-05 LAB — BASIC METABOLIC PANEL
BUN/Creatinine Ratio: 29 — ABNORMAL HIGH (ref 10–24)
BUN: 33 mg/dL — ABNORMAL HIGH (ref 8–27)
CO2: 33 mmol/L — ABNORMAL HIGH (ref 20–29)
Calcium: 10.2 mg/dL (ref 8.6–10.2)
Chloride: 96 mmol/L (ref 96–106)
Creatinine, Ser: 1.14 mg/dL (ref 0.76–1.27)
Glucose: 101 mg/dL — ABNORMAL HIGH (ref 65–99)
Potassium: 5 mmol/L (ref 3.5–5.2)
Sodium: 134 mmol/L (ref 134–144)
eGFR: 70 mL/min/{1.73_m2} (ref >59)

## 2021-06-05 NOTE — Progress Notes (Signed)
Subjective:    Patient ID: Lee Barnes, male    DOB: Mar 21, 1951, 70 y.o.   MRN: [id number]  HPI He is here for recheck.  [place] beach and ran into trouble with aspiration.  He has been working with speech pathology however in spite of that he continues have difficulty with aspiration.  While he was at the beach that the did have him evaluated by speech pathology and noted continued aspiration and therefore put a G-tube in.  His sodium when he was admitted was 127 which for him is really not unreasonable.  Since returning he has been seen by speech pathology and they did go over exercises with him.  They also have received a box of nutrition for the G-tube.  He apparently has gained a pound since being placed on the G-tube.   Review of Systems     Objective:   Physical Exam Alert and in no distress and does look much better than in the past.       Assessment & Plan:  G tube feedings (HCC) - Plan: Amb ref to Medical Nutrition Therapy-MNT, Ambulatory referral to Gastroenterology  Chronic hyponatremia - Plan: Amb ref to Medical Nutrition Therapy-MNT, Ambulatory referral to Gastroenterology, Basic Metabolic Panel  Oral phase dysphagia - Plan: Amb ref to Medical Nutrition Therapy-MNT, Ambulatory referral to Gastroenterology  History of aspiration pneumonia - Plan: Ambulatory referral to Gastroenterology  History of oropharyngeal cancer - Plan: Ambulatory referral to Gastroenterology A complicated situation to say the least.  He will continue to be followed by speech pathology weekly for right now to see how much better he can get to control his aspiration.  He and his wife recognize the fact that the G-tube might be a permanent fixture and seem comfortable with that.  He will need to be referred to gastroenterology to get their input into proper care of the G-tube and also to help monitor his nutrition.  He is also been referred to nutritionist to help with all this.  It  is obviously complicated by his previous history of hyponatremia.  I will continue to monitor the situation so he does not get lost in the shuffle.

## 2021-06-06 DIAGNOSIS — K2950 Unspecified chronic gastritis without bleeding: Secondary | ICD-10-CM | POA: Diagnosis not present

## 2021-06-07 ENCOUNTER — Other Ambulatory Visit: Payer: Self-pay | Admitting: Internal Medicine

## 2021-06-07 ENCOUNTER — Telehealth: Payer: Self-pay | Admitting: Internal Medicine

## 2021-06-07 DIAGNOSIS — Z8701 Personal history of pneumonia (recurrent): Secondary | ICD-10-CM

## 2021-06-07 DIAGNOSIS — Z85819 Personal history of malignant neoplasm of unspecified site of lip, oral cavity, and pharynx: Secondary | ICD-10-CM

## 2021-06-07 DIAGNOSIS — R1311 Dysphagia, oral phase: Secondary | ICD-10-CM

## 2021-06-07 DIAGNOSIS — Z931 Gastrostomy status: Secondary | ICD-10-CM

## 2021-06-07 DIAGNOSIS — E871 Hypo-osmolality and hyponatremia: Secondary | ICD-10-CM

## 2021-06-07 NOTE — Telephone Encounter (Signed)
Dr. Benson Norway office does not follow-up on G-tubes and I called Nutritionist and they do not take care of anyone with G-tubes either. She states it would be like home health to help with that. So she told me medicare does not cover nutrition appointments. Please advise as I do not know of someone that will take patient

## 2021-06-07 NOTE — Telephone Encounter (Signed)
Called [facility] to try to get patient in for an urgent referral and GI said they do not handle G-tubes that the patient would need to go where it was put in at. Please advise

## 2021-06-08 ENCOUNTER — Telehealth: Payer: Self-pay | Admitting: Family Medicine

## 2021-06-08 NOTE — Telephone Encounter (Signed)
Called [name], pt's spouse and advised that Dr. Benson Norway will work him in for an appt on Monday 06/11/2021 at 3:45. Wife was advised to arrive 15 minutes early and due to being a work in pt should plan to be there a while per Dr. Ulyses Amor office. Also advised that all medical records and demographic info was faxed to Dr. Benson Norway. I did confirm that his office did receive all information. Pt's wife verbalized understanding of all information.

## 2021-06-08 NOTE — Telephone Encounter (Signed)
Pt's wife notified that we are waiting on a call back per [facility]

## 2021-06-11 ENCOUNTER — Ambulatory Visit: Payer: Medicare Other

## 2021-06-11 ENCOUNTER — Other Ambulatory Visit: Payer: Self-pay

## 2021-06-11 DIAGNOSIS — R633 Feeding difficulties, unspecified: Secondary | ICD-10-CM | POA: Diagnosis not present

## 2021-06-11 DIAGNOSIS — E860 Dehydration: Secondary | ICD-10-CM | POA: Diagnosis not present

## 2021-06-11 DIAGNOSIS — R1312 Dysphagia, oropharyngeal phase: Secondary | ICD-10-CM | POA: Diagnosis not present

## 2021-06-11 DIAGNOSIS — E46 Unspecified protein-calorie malnutrition: Secondary | ICD-10-CM | POA: Diagnosis not present

## 2021-06-11 DIAGNOSIS — R471 Dysarthria and anarthria: Secondary | ICD-10-CM

## 2021-06-11 DIAGNOSIS — J690 Pneumonitis due to inhalation of food and vomit: Secondary | ICD-10-CM | POA: Diagnosis not present

## 2021-06-11 NOTE — Therapy (Signed)
[place] Date 07/06/21               Plan - 06/11/21 1718     Clinical Impression Statement Lee Barnes is demonstrating accurate completion of both EMT and IMT. He was hospitalized last week for aspiration PNA, per pt. SLP attempting to obtain report of objective swallow eval. Pt now placed with PEG. He acknowledged today he will complete his dysphagia HEP in it's entirety, cycling through HEP instead of copmleting straight through. SLP strongly stressed BID. Continue skilled ST to improve airway protection and reduce risk of aspiration pna.    Speech Therapy Frequency 2x / week    Duration --   5 weeks (until 07-06-21)   Treatment/Interventions Aspiration precaution training;Pharyngeal strengthening exercises;Diet toleration management by SLP;Trials of upgraded texture/liquids;Internal/external aids;Patient/family education;Compensatory strategies;Oral motor exercises;Environmental controls;SLP instruction and feedback;Other (comment)    Potential to Achieve Goals Fair    Potential Considerations Co-morbidities;Previous level of function;Severity of impairments             Patient will benefit from skilled therapeutic intervention in order to improve the following deficits and impairments:   Dysphagia, oropharyngeal phase  Dysarthria and anarthria    Problem List Patient Active Problem List   Diagnosis Date Noted   Chronic hyponatremia 02/20/2021   Dysphagia 12/12/2020   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 [place], [place], [address] Phone: [phone]   Fax:  [phone]   Name: Lee Barnes MRN: [id number] Date of  Birth: 27-Dec-1950  [place] Date 07/06/21               Plan - 06/11/21 1718     Clinical Impression Statement Lee Barnes is demonstrating accurate completion of both EMT and IMT. He was hospitalized last week for aspiration PNA, per pt. SLP attempting to obtain report of objective swallow eval. Pt now placed with PEG. He acknowledged today he will complete his dysphagia HEP in it's entirety, cycling through HEP instead of copmleting straight through. SLP strongly stressed BID. Continue skilled ST to improve airway protection and reduce risk of aspiration pna.    Speech Therapy Frequency 2x / week    Duration --   5 weeks (until 07-06-21)   Treatment/Interventions Aspiration precaution training;Pharyngeal strengthening exercises;Diet toleration management by SLP;Trials of upgraded texture/liquids;Internal/external aids;Patient/family education;Compensatory strategies;Oral motor exercises;Environmental controls;SLP instruction and feedback;Other (comment)    Potential to Achieve Goals Fair    Potential Considerations Co-morbidities;Previous level of function;Severity of impairments             Patient will benefit from skilled therapeutic intervention in order to improve the following deficits and impairments:   Dysphagia, oropharyngeal phase  Dysarthria and anarthria    Problem List Patient Active Problem List   Diagnosis Date Noted   Chronic hyponatremia 02/20/2021   Dysphagia 12/12/2020   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 [place], [place], [address] Phone: [phone]   Fax:  [phone]   Name: Lee Barnes MRN: [id number] Date of  Birth: 27-Dec-1950  [facility] [address] [place], [place], [address] Phone: [phone]   Fax:  [phone]  Speech Language Pathology Treatment  Patient Details  Name: Lee Barnes MRN: [id number] Date of Birth: 23-Jan-1951 Referring Provider (SLP): Dr. Jill Alexanders   Encounter Date: 06/11/2021   End of Session - 06/11/21 1718     Visit Number 13    Number of Visits 17    Date for SLP Re-Evaluation 07/06/21    SLP Start Time 1105    SLP Stop Time  1146    SLP Time Calculation (min) 41 min    Activity Tolerance Patient tolerated treatment well             Past Medical History:  Diagnosis Date   Allergy to environmental factors    Complication of anesthesia    "vageled" after surgery -overnite stay   Diverticulosis 2008   Heart murmur    hx of in childhood    History of chemotherapy    History of radiation therapy 11/05/10-12/26/10   r base tongue, 7000 cGy 35 sessions   Hypertension    Medication started in May 2016.   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   Tinnitus     Past Surgical History:  Procedure Laterality Date   APPENDECTOMY     COLONOSCOPY     INGUINAL HERNIA REPAIR Bilateral 03/30/2015   Procedure: LAPAROSCOPIC BILATERAL INGUINAL HERNIA REPAIR WITH MESH;  Surgeon: Coralie Keens, MD;  Location: [facility];  Service: General;  Laterality: Bilateral;   INGUINAL HERNIA REPAIR Right [place] Bilateral 03/30/2015   INGUINAL HERNIA REPAIR Left 07/11/2015   INGUINAL HERNIA REPAIR N/A 07/11/2015   Procedure: REPAIR OF LEFT INGUINAL HERNIA WITH MESH;  Surgeon: Coralie Keens, MD;  Location: [facility];  Service: General;  Laterality: N/A;   INSERTION OF MESH N/A 07/11/2015   Procedure: INSERTION OF MESH;  Surgeon: Coralie Keens, MD;  Location: [facility];  Service: General;  Laterality: N/A;   LUMBAR DISC SURGERY      TONSILLECTOMY      There were no vitals filed for this visit.   Subjective Assessment - 06/11/21 1108     Subjective "It seems like I'm doing them more than I was." Pt appears more "noisy" while talking than previous session.    Currently in Pain? No/denies                   ADULT SLP TREATMENT - 06/11/21 1111       General Information   Behavior/Cognition Alert;Cooperative;Pleasant mood      Treatment Provided   Treatment provided Dysphagia      Dysphagia Treatment   Respiratory Status Room air    Oral Cavity - Dentition Adequate natural dentition    Other treatment/comments Lee Barnes reports today that he does exercises at many different times each day "Sometimes I do more than what you recommended for the cycle. Maybe I'll do 7 or 8 instead of 5." Pt reports he is getting approx 20 oz. H2O/day - SLP recommended at least 60 oz./day and this might help thin pt's thick secretions which he states are in his throat regualrly. SLP also told pt his muscles in pharynx are not strong enough to work efficiently to bring these secretions down right now but with more time doing his swallow exercises and

## 2021-06-18 ENCOUNTER — Ambulatory Visit: Payer: Medicare Other | Attending: Family Medicine

## 2021-06-18 ENCOUNTER — Other Ambulatory Visit: Payer: Self-pay

## 2021-06-18 DIAGNOSIS — R471 Dysarthria and anarthria: Secondary | ICD-10-CM | POA: Insufficient documentation

## 2021-06-18 DIAGNOSIS — R490 Dysphonia: Secondary | ICD-10-CM | POA: Insufficient documentation

## 2021-06-18 DIAGNOSIS — R1312 Dysphagia, oropharyngeal phase: Secondary | ICD-10-CM | POA: Diagnosis not present

## 2021-06-18 NOTE — Therapy (Signed)
improve airway protection and reduce risk of aspiration pna.    Speech Therapy Frequency 2x / week     Duration --   5 weeks (until 07-06-21)   Treatment/Interventions Aspiration precaution training;Pharyngeal strengthening exercises;Diet toleration management by SLP;Trials of upgraded texture/liquids;Internal/external aids;Patient/family education;Compensatory strategies;Oral motor exercises;Environmental controls;SLP instruction and feedback;Other (comment)    Potential to Achieve Goals Fair    Potential Considerations Co-morbidities;Previous level of function;Severity of impairments             Patient will benefit from skilled therapeutic intervention in order to improve the following deficits and impairments:   Dysphagia, oropharyngeal phase  Dysarthria and anarthria    Problem List Patient Active Problem List   Diagnosis Date Noted   Chronic hyponatremia 02/20/2021   Dysphagia 12/12/2020   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 [place], [place], [address] Phone: [phone]   Fax:  [phone]   Name: Lee Barnes MRN: [id number] Date of Birth: 1951-01-24  improve airway protection and reduce risk of aspiration pna.    Speech Therapy Frequency 2x / week     Duration --   5 weeks (until 07-06-21)   Treatment/Interventions Aspiration precaution training;Pharyngeal strengthening exercises;Diet toleration management by SLP;Trials of upgraded texture/liquids;Internal/external aids;Patient/family education;Compensatory strategies;Oral motor exercises;Environmental controls;SLP instruction and feedback;Other (comment)    Potential to Achieve Goals Fair    Potential Considerations Co-morbidities;Previous level of function;Severity of impairments             Patient will benefit from skilled therapeutic intervention in order to improve the following deficits and impairments:   Dysphagia, oropharyngeal phase  Dysarthria and anarthria    Problem List Patient Active Problem List   Diagnosis Date Noted   Chronic hyponatremia 02/20/2021   Dysphagia 12/12/2020   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 [place], [place], [address] Phone: [phone]   Fax:  [phone]   Name: Lee Barnes MRN: [id number] Date of Birth: 1951-01-24  Lee Barnes [address] [place], [place], [address] Phone: [phone]   Fax:  [phone]  Speech Language Pathology Treatment  Patient Details  Name: Lee Barnes MRN: [id number] Date of Birth: [birth year] Referring Provider (SLP): Dr. Jill Alexanders   Encounter Date: 06/18/2021   End of Session - 06/18/21 1453     Visit Number 14    Number of Visits 17    Date for SLP Re-Evaluation 07/06/21    SLP Start Time 1106    SLP Stop Time  1146    SLP Time Calculation (min) 40 min    Activity Tolerance Patient tolerated treatment well             Past Medical History:  Diagnosis Date   Allergy to environmental factors    Complication of anesthesia    "vageled" after surgery -overnite stay   Diverticulosis 2008   Heart murmur    hx of in childhood    History of chemotherapy    History of radiation therapy 11/05/10-12/26/10   r base tongue, 7000 cGy 35 sessions   Hypertension    Medication started in May 2016.   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   Pneumonia    hx of 2012   Squamous cell carcinoma    right base of tongue   Tinnitus     Past Surgical History:  Procedure Laterality Date   APPENDECTOMY     COLONOSCOPY     INGUINAL HERNIA REPAIR Bilateral 03/30/2015   Procedure: LAPAROSCOPIC BILATERAL INGUINAL HERNIA REPAIR WITH MESH;  Surgeon: Coralie Keens, MD;  Location: [facility];  Service: General;  Laterality: Bilateral;   INGUINAL HERNIA REPAIR Right [place] Bilateral 03/30/2015   INGUINAL HERNIA REPAIR Left 07/11/2015   INGUINAL HERNIA REPAIR N/A 07/11/2015   Procedure: REPAIR OF LEFT INGUINAL HERNIA WITH MESH;  Surgeon: Coralie Keens, MD;  Location: [place];  Service: General;  Laterality: N/A;   INSERTION OF MESH N/A 07/11/2015   Procedure: INSERTION OF MESH;  Surgeon: Coralie Keens, MD;  Location: [place];  Service: General;  Laterality: N/A;   LUMBAR DISC SURGERY      TONSILLECTOMY      There were no vitals filed for this visit.   Subjective Assessment - 06/18/21 1111     Subjective Pt tells SLP he has had difficulty with low blood pressure in the AMs.                   ADULT SLP TREATMENT - 06/18/21 1130       General Information   Behavior/Cognition Alert;Cooperative;Pleasant mood      Treatment Provided   Treatment provided Dysphagia      Dysphagia Treatment   Other treatment/comments Self care homemanagement (19 minutes)SLP engaged pt in discussion about how to obtain his modified report. Pt to find thename of the hospital he was at and get back to SLP. Pt reports diarrhea 1-2 times a day with current tube feeding. [name] is thinking it is just something he wil have to deal with - SLP inquired of [name] dietician to see if there is dietician who sees pt outpatient basis for guidance aobut other tube feeds to minimize diarrhea. (swallow tx) Pt repots he has done exercises at least once/day and rarely twice a day, but  yesterday did not do them as he did not feel well. SLP had pt review each exercise on his HEP with good procedure noted from pt

## 2021-06-21 DIAGNOSIS — L570 Actinic keratosis: Secondary | ICD-10-CM | POA: Diagnosis not present

## 2021-06-21 DIAGNOSIS — Z85828 Personal history of other malignant neoplasm of skin: Secondary | ICD-10-CM | POA: Diagnosis not present

## 2021-06-21 DIAGNOSIS — L820 Inflamed seborrheic keratosis: Secondary | ICD-10-CM | POA: Diagnosis not present

## 2021-06-21 DIAGNOSIS — L309 Dermatitis, unspecified: Secondary | ICD-10-CM | POA: Diagnosis not present

## 2021-06-21 DIAGNOSIS — D225 Melanocytic nevi of trunk: Secondary | ICD-10-CM | POA: Diagnosis not present

## 2021-06-21 DIAGNOSIS — L821 Other seborrheic keratosis: Secondary | ICD-10-CM | POA: Diagnosis not present

## 2021-06-25 ENCOUNTER — Ambulatory Visit: Payer: Medicare Other

## 2021-06-25 DIAGNOSIS — E46 Unspecified protein-calorie malnutrition: Secondary | ICD-10-CM | POA: Diagnosis not present

## 2021-06-25 DIAGNOSIS — R633 Feeding difficulties, unspecified: Secondary | ICD-10-CM | POA: Diagnosis not present

## 2021-06-26 ENCOUNTER — Other Ambulatory Visit: Payer: Self-pay

## 2021-06-26 ENCOUNTER — Ambulatory Visit: Payer: Medicare Other

## 2021-06-26 DIAGNOSIS — R471 Dysarthria and anarthria: Secondary | ICD-10-CM

## 2021-06-26 DIAGNOSIS — R1312 Dysphagia, oropharyngeal phase: Secondary | ICD-10-CM | POA: Diagnosis not present

## 2021-06-26 DIAGNOSIS — R490 Dysphonia: Secondary | ICD-10-CM | POA: Diagnosis not present

## 2021-06-26 NOTE — Therapy (Signed)
to have something PO for muscle memory and mitigate muscle atrophy so SLP stressed ice chips/water after thorough oral care prior x2-3/day.  Continue skilled ST to improve airway protection and reduce risk of aspiration pna.    Speech Therapy Frequency 2x / week    Duration --   5 weeks (until 07-06-21)   Treatment/Interventions Aspiration precaution training;Pharyngeal strengthening exercises;Diet toleration management by SLP;Trials of upgraded texture/liquids;Internal/external aids;Patient/family education;Compensatory strategies;Oral motor exercises;Environmental controls;SLP instruction and feedback;Other (comment)    Potential to Achieve Goals Fair    Potential Considerations Co-morbidities;Previous level of function;Severity of impairments             Patient will benefit from skilled therapeutic intervention in order to improve the following deficits and impairments:   Dysphagia, oropharyngeal phase  Dysarthria and anarthria    Problem List Patient Active Problem List   Diagnosis Date Noted   Chronic hyponatremia 02/20/2021   Dysphagia 12/12/2020   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 ,MS, CCC-SLP  06/26/2021, 1:01 PM  [place] [address] [place], [place], [address] Phone: [phone]   Fax:  [phone]   Name: Lee Barnes MRN: [id number] Date of Birth: 1950/12/05  to have something PO for muscle memory and mitigate muscle atrophy so SLP stressed ice chips/water after thorough oral care prior x2-3/day.  Continue skilled ST to improve airway protection and reduce risk of aspiration pna.    Speech Therapy Frequency 2x / week    Duration --   5 weeks (until 07-06-21)   Treatment/Interventions Aspiration precaution training;Pharyngeal strengthening exercises;Diet toleration management by SLP;Trials of upgraded texture/liquids;Internal/external aids;Patient/family education;Compensatory strategies;Oral motor exercises;Environmental controls;SLP instruction and feedback;Other (comment)    Potential to Achieve Goals Fair    Potential Considerations Co-morbidities;Previous level of function;Severity of impairments             Patient will benefit from skilled therapeutic intervention in order to improve the following deficits and impairments:   Dysphagia, oropharyngeal phase  Dysarthria and anarthria    Problem List Patient Active Problem List   Diagnosis Date Noted   Chronic hyponatremia 02/20/2021   Dysphagia 12/12/2020   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 ,MS, CCC-SLP  06/26/2021, 1:01 PM  [place] [address] [place], [place], [address] Phone: [phone]   Fax:  [phone]   Name: Lee Barnes MRN: [id number] Date of Birth: 1950/12/05  [place] [address] [place], [place], [address] Phone: [phone]   Fax:  [phone]  Speech Language Pathology Treatment  Patient Details  Name: Lee Barnes MRN: [id number] Date of Birth: December 03, 1950 Referring Provider (SLP): Dr. Jill Alexanders   Encounter Date: 06/26/2021   End of Session - 06/26/21 1259     Visit Number 15    Number of Visits 17    Date for SLP Re-Evaluation 07/06/21    SLP Start Time 1020    SLP Stop Time  1100    SLP Time Calculation (min) 40 min    Activity Tolerance Patient tolerated treatment well             Past Medical History:  Diagnosis Date   Allergy to environmental factors    Complication of anesthesia    "vageled" after surgery -overnite stay   Diverticulosis 2008   Heart murmur    hx of in childhood    History of chemotherapy    History of radiation therapy 11/05/10-12/26/10   r base tongue, 7000 cGy 35 sessions   Hypertension    Medication started in May 2016.   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   Pneumonia    hx of 2012   Squamous cell carcinoma    right base of tongue   Tinnitus     Past Surgical History:  Procedure Laterality Date   APPENDECTOMY     COLONOSCOPY     INGUINAL HERNIA REPAIR Bilateral 03/30/2015   Procedure: LAPAROSCOPIC BILATERAL INGUINAL HERNIA REPAIR WITH MESH;  Surgeon: Coralie Keens, MD;  [facility];  Service: General;  Laterality: Bilateral;   INGUINAL HERNIA REPAIR Right [place] Bilateral 03/30/2015   INGUINAL HERNIA REPAIR Left 07/11/2015   INGUINAL HERNIA REPAIR N/A 07/11/2015   Procedure: REPAIR OF LEFT INGUINAL HERNIA WITH MESH;  Surgeon: Coralie Keens, MD;  Location: [place];  Service: General;  Laterality: N/A;   INSERTION OF MESH N/A 07/11/2015   Procedure: INSERTION OF MESH;  Surgeon: Coralie Keens, MD;  Location: [place];  Service: General;  Laterality: N/A;   LUMBAR DISC SURGERY      TONSILLECTOMY      There were no vitals filed for this visit.   Subjective Assessment - 06/26/21 1034     Subjective Low blood pressure during time of last appointment - pt had to call and cx.                   ADULT SLP TREATMENT - 06/26/21 1034       General Information   Behavior/Cognition Alert;Cooperative;Pleasant mood      Treatment Provided   Treatment provided Dysphagia      Dysphagia Treatment   Respiratory Status Room air    Oral Cavity - Dentition Adequate natural dentition    Other treatment/comments SLP told pt that SLP thinks its important to cont with PO water after thorough meticulous oral care x2/3/day. Pt to follow the procedure of hard swallow x3, hock or cough, and additional swallow for each sip. EMST -proper sound approx 80% of the time. IMST - proper sound 60% of the time, most sets pt's last two productions were notably weaker than first three. Pt was independent with his other exercises. SLP to observe pt with POs next session but for now pt will perform PO trials as directed by SLP this session, after meticulous thorough oral care. SLP suggested, as pt has had hospitalization for

## 2021-07-02 ENCOUNTER — Other Ambulatory Visit: Payer: Self-pay

## 2021-07-02 ENCOUNTER — Ambulatory Visit: Payer: Medicare Other | Admitting: Speech Pathology

## 2021-07-02 ENCOUNTER — Encounter: Payer: Self-pay | Admitting: Speech Pathology

## 2021-07-02 DIAGNOSIS — R1312 Dysphagia, oropharyngeal phase: Secondary | ICD-10-CM

## 2021-07-02 DIAGNOSIS — R490 Dysphonia: Secondary | ICD-10-CM | POA: Diagnosis not present

## 2021-07-02 DIAGNOSIS — R471 Dysarthria and anarthria: Secondary | ICD-10-CM | POA: Diagnosis not present

## 2021-07-02 NOTE — Therapy (Signed)
[facility] [address] [place], [place], [address] Phone: [phone]   Fax:  [phone]  Speech Language Pathology Treatment  Patient Details  Name: Lee Barnes MRN: [id number] Date of Birth: 10-22-1951 Referring Provider (SLP): Dr. Sharlot Gowda   Encounter Date: 07/02/2021   End of Session - 07/02/21 1150     Visit Number 16    Number of Visits 17    Date for SLP Re-Evaluation 07/06/21    Authorization Type none    SLP Start Time 1100    SLP Stop Time  1136    SLP Time Calculation (min) 36 min    Activity Tolerance Patient tolerated treatment well             Past Medical History:  Diagnosis Date   Allergy to environmental factors    Complication of anesthesia    "vageled" after surgery -overnite stay   Diverticulosis 2008   Heart murmur    hx of in childhood    History of chemotherapy    History of radiation therapy 11/05/10-12/26/10   r base tongue, 7000 cGy 35 sessions   Hypertension    Medication started in May 2016.   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   Tinnitus     Past Surgical History:  Procedure Laterality Date   APPENDECTOMY     COLONOSCOPY     INGUINAL HERNIA REPAIR Bilateral 03/30/2015   Procedure: LAPAROSCOPIC BILATERAL INGUINAL HERNIA REPAIR WITH MESH;  Surgeon: Abigail Miyamoto, MD;  [facility];  Service: General;  Laterality: Bilateral;   INGUINAL HERNIA REPAIR Right 1960   INGUINAL HERNIA REPAIR Bilateral 03/30/2015   INGUINAL HERNIA REPAIR Left 07/11/2015   INGUINAL HERNIA REPAIR N/A 07/11/2015   Procedure: REPAIR OF LEFT INGUINAL HERNIA WITH MESH;  Surgeon: Abigail Miyamoto, MD;  Location[facility];  Service: General;  Laterality: N/A;   INSERTION OF MESH N/A 07/11/2015   Procedure: INSERTION OF MESH;  Surgeon: Abigail Miyamoto, MD;  Location: [facility];  Service: General;  Laterality: N/A;    LUMBAR DISC SURGERY     TONSILLECTOMY      There were no vitals filed for this visit.   Subjective Assessment - 07/02/21 1109     Subjective "I sit up for at least an hour after I feed"    Currently in Pain? No/denies                   ADULT SLP TREATMENT - 07/02/21 1121       General Information   Behavior/Cognition Alert;Cooperative;Pleasant mood      Treatment Provided   Treatment provided Dysphagia      Dysphagia Treatment   Respiratory Status Room air    Oral Cavity - Dentition Adequate natural dentition    Treatment Methods Therapeutic exercise;Patient/caregiver education;Skilled observation;Compensation strategy training    Patient observed directly with PO's Yes    Type of PO's observed Thin liquids    Feeding Able to feed self    Liquids provided via Cup    Amount of cueing Modified independent    Other treatment/comments Lee Barnes reports he has take water twice since last session - encouraged him to continue PO water or ice chips after thorough oral care. Increased EMST to 55 cm H2O and IMST from 25 to 33 cm H2O. Lee Barnes completed 25 reps accurately with rare min  A. HEP with mod I. Small sips H2O during HEP with effortful swallow and  immediate cough as swallow precautions with rare min A.      Assessment / Recommendations / Plan   Plan Continue with current plan of care   encouraged ice chips and water to maintain muscle strength and memory     Dysphagia Recommendations   Diet recommendations NPO   Free water with hard swallow and immediate cough     Progression Toward Goals   Progression toward goals Progressing toward goals              SLP Education - 07/02/21 1145     Education Details continue RMST, HEP and add water ice chips after oral care    Person(s) Educated Patient    Methods Explanation    Comprehension Verbalized understanding              SLP Short Term Goals - 07/02/21 1149       SLP SHORT TERM GOAL #1   Title Pt will  follow swallow precautions with rare min A over 2 sessions    Time 1    Period Weeks    Status Not Met      SLP SHORT TERM GOAL #2   Title Pt will complete HEP for dysphagia with rare min A over 2 sessions    Baseline 04/02/21    Time 1    Period Weeks    Status Partially Met      SLP SHORT TERM GOAL #3   Title Pt will complete RMT HEP of 3 sets of 25 repetitions of IMT and EMT at 60-70% of MIP and MEP 5/7 days for 4 weeks    Time 1    Period Weeks    Status Not Met              SLP Long Term Goals - 07/02/21 1150       SLP LONG TERM GOAL #1   Title Pt will follow swallow precautions with mod I over 2 sessoins    Baseline 05/02/21, 05/07/21    Status Achieved      SLP LONG TERM GOAL #2   Title Pt will complete HEP for dysphagia with mod I over 2 sessions    Baseline 05/02/21, 05/07/21    Status Achieved      SLP LONG TERM GOAL #3   Title Pt will demonstrate improved airway protection, glottal closure by not contracting aspiration pna over 8 weeks.    Period Weeks    Status Not Met      SLP LONG TERM GOAL #4   Title Pt will complete HEP for dysphagia with mod I over 3 sessions    Baseline 06-26-21    Time 1    Period Weeks    Status On-going      SLP LONG TERM GOAL #5   Title pt will complete 25 adequate reps of EMST with at least 55 cm H2O and of IMST with at least 25 cm H2O (target at least 60 for EMST and at least 28 for IMST)    Time 2    Period Weeks    Status On-going    Target Date 07/06/21              Plan - 07/02/21 1145     Clinical Impression Statement Lee Barnes cont demonstrating accurate completion of both EMT and IMT. He was hospitalized on vacation for aspiration PNA, per pt. He  was placed with PEG after MBSS in [facility]. SLP cont to attempt to obtain report of objective swallow eval. SLP again strongly stressed BID HEP as directed. Pt needs to have something PO for muscle memory and mitigate muscle atrophy so SLP stressed ice chips/water  after thorough oral care prior x2-3/day. Continue skilled ST to improve airway protection and reduce risk of aspiration pna.    Speech Therapy Frequency 2x / week    Duration --   5 weeks until 07-06-21   Treatment/Interventions Aspiration precaution training;Pharyngeal strengthening exercises;Diet toleration management by SLP;Trials of upgraded texture/liquids;Internal/external aids;Patient/family education;Compensatory strategies;Oral motor exercises;Environmental controls;SLP instruction and feedback;Other (comment)    Potential to Achieve Goals Fair    Potential Considerations Co-morbidities;Previous level of function;Severity of impairments             Patient will benefit from skilled therapeutic intervention in order to improve the following deficits and impairments:   Dysphagia, oropharyngeal phase    Problem List Patient Active Problem List   Diagnosis Date Noted   Chronic hyponatremia 02/20/2021   Dysphagia 12/12/2020   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 [place], [place], [address] Phone: [phone]   Fax:  [phone]   Name: Lee Barnes MRN: [id number] Date of Birth: [birth year]

## 2021-07-09 ENCOUNTER — Telehealth: Payer: Self-pay | Admitting: Family Medicine

## 2021-07-09 ENCOUNTER — Other Ambulatory Visit: Payer: Self-pay

## 2021-07-09 ENCOUNTER — Ambulatory Visit: Payer: Medicare Other

## 2021-07-09 DIAGNOSIS — R471 Dysarthria and anarthria: Secondary | ICD-10-CM | POA: Diagnosis not present

## 2021-07-09 DIAGNOSIS — R1312 Dysphagia, oropharyngeal phase: Secondary | ICD-10-CM

## 2021-07-09 DIAGNOSIS — R490 Dysphonia: Secondary | ICD-10-CM

## 2021-07-09 NOTE — Telephone Encounter (Signed)
Pt's wife called and states that she thinks his bp dose my need to be lowered since he started the liquid diest. She states that his BP is all over the place and she has not been giving his the medication if his bp is normal or lower. Please advise [name] at [phone].

## 2021-07-09 NOTE — Therapy (Signed)
to 31cm H2O. Stressed to pt 3 sets of 25 necessary for practice, as he only got one set of 25 in since last session. HEP (handout) completed with modified indpendence. Tongue protrusion with ga-ga-ga with mostly indiscriminate articulation. "Ing - ga" completed without rough vocal quality.     SLP Short Term Goals - 07/02/21 1149       SLP SHORT TERM GOAL #1   Title Pt will follow swallow precautions with rare min A over 2 sessions    Time 1    Period Weeks    Status Not Met      SLP SHORT TERM GOAL #2   Title Pt will complete HEP for dysphagia with rare min A over 2 sessions    Baseline 04/02/21    Time 1    Period Weeks    Status Partially Met      SLP SHORT TERM GOAL #3   Title Pt will complete RMT HEP of 3 sets of  25 repetitions of IMT and EMT at 60-70% of MIP and MEP 5/7 days for 4 weeks    Time 1    Period Weeks    Status Not Met              SLP Long Term Goals - 07/02/21 1150       SLP LONG TERM GOAL #1   Title Pt will follow swallow precautions with mod I over 2 sessoins    Baseline 05/02/21, 05/07/21    Status Achieved      SLP LONG TERM GOAL #2   Title Pt will complete HEP for dysphagia with mod I over 2 sessions    Baseline 05/02/21, 05/07/21    Status Achieved      SLP LONG TERM GOAL #3   Title Pt will demonstrate improved airway protection, glottal closure by not contracting aspiration pna over 8 weeks.    Period Weeks    Status Not Met      SLP LONG TERM GOAL #4   Title Pt will complete HEP for dysphagia with mod I over 3 sessions    Baseline 06-26-21    Time 1    Period Weeks    Status On-going      SLP LONG TERM GOAL #5   Title pt will complete 25 adequate reps of EMST with at least 55 cm H2O and of IMST with at least 25 cm H2O (target at least 60 for EMST and at least 28 for IMST)    Time 2    Period Weeks    Status On-going    Target Date 07/06/21               Patient will benefit from skilled therapeutic intervention in order to improve the following deficits and impairments:   No diagnosis found.    Problem List Patient Active Problem List   Diagnosis Date Noted   Chronic hyponatremia 02/20/2021   Dysphagia 12/12/2020   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   ACE-inhibitor cough 05/25/2015   Essential hypertension 03/21/2015   Allergic rhinitis 12/14/2013   ED (erectile dysfunction) 12/14/2013   Diverticulosis    History of oropharyngeal cancer 09/18/2010    [facility] ,MS, CCC-SLP  07/09/2021, 11:44 AM  [facility] [address] [place], [place], [address] Phone: [phone]   Fax:  [phone]   Name: Lee Barnes MRN: [id number] Date of Birth: 24-Jun-1951  [facility] [address] [place], [place], [address] Phone: [phone]   Fax:  [phone]  Speech Language Pathology Treatment  Patient Details  Name: Lee Barnes MRN: [id number] Date of Birth: 09-23-1951 Referring Provider (SLP): Dr. Jill Alexanders   Encounter Date: 07/09/2021    Past Medical History:  Diagnosis Date   Allergy to environmental factors    Complication of anesthesia    "vageled" after surgery -overnite stay   Diverticulosis 2008   Heart murmur    hx of in childhood    History of chemotherapy    History of radiation therapy 11/05/10-12/26/10   r base tongue, 7000 cGy 35 sessions   Hypertension    Medication started in May 2016.   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   Tinnitus     Past Surgical History:  Procedure Laterality Date   APPENDECTOMY     COLONOSCOPY     INGUINAL HERNIA REPAIR Bilateral 03/30/2015   Procedure: LAPAROSCOPIC BILATERAL INGUINAL HERNIA REPAIR WITH MESH;  Surgeon: Coralie Keens, MD;  Location: [facility];  Service: General;  Laterality: Bilateral;   INGUINAL HERNIA REPAIR Right [place] Bilateral 03/30/2015   INGUINAL HERNIA REPAIR Left 07/11/2015   INGUINAL HERNIA REPAIR N/A 07/11/2015   Procedure: REPAIR OF LEFT INGUINAL HERNIA WITH MESH;  Surgeon: Coralie Keens, MD;  Location: [place] [facility];  Service: General;  Laterality: N/A;   INSERTION OF MESH N/A 07/11/2015   Procedure: INSERTION OF MESH;  Surgeon: Coralie Keens, MD;  Location: [facility];  Service: General;  Laterality: N/A;   LUMBAR DISC SURGERY     TONSILLECTOMY      There were no vitals filed for this visit.   Subjective Assessment - 07/09/21 1110     Subjective "It was a little more complicated - we had my grandchildren here."    Currently in Pain? No/denies                   ADULT SLP TREATMENT -  07/09/21 1114       General Information   Behavior/Cognition Alert;Cooperative;Pleasant mood      Treatment Provided   Treatment provided Dysphagia      Dysphagia Treatment   Respiratory Status Room air    Oral Cavity - Dentition Adequate natural dentition    Treatment Methods Therapeutic exercise;Patient/caregiver education;Skilled observation;Compensation strategy training    Patient observed directly with PO's No   see note     Assessment / Recommendations / Plan   Plan Continue with current plan of care   told pt 75 reps/day (5 sets of 5, x3/day)of respiratory muscle trainers is essential     Dysphagia Recommendations   Diet recommendations NPO   with ice chips/H2O after oral care     Progression Toward Goals   Progression toward goals Progressing toward goals           Pt performed oral care at 10:30 AM, and it was 11:10 AM at time of possible POs, so SLP did not do any POs with pt. SLP told Lee Barnes to arrive 5 minutes earlier and do oral care here in the clinic for next session. EMST at 55cmH2O 20/25 strong bike pump sounds. Pt rated effort 8/10. With IMST 33 cmH2O 15 /25 strong bike pump sounds with pt rating effort at 8.5/10.  SLP adjusted IMST down

## 2021-07-11 ENCOUNTER — Telehealth (INDEPENDENT_AMBULATORY_CARE_PROVIDER_SITE_OTHER): Payer: Medicare Other | Admitting: Family Medicine

## 2021-07-11 VITALS — BP 111/67 | Wt 155.0 lb

## 2021-07-11 DIAGNOSIS — Z931 Gastrostomy status: Secondary | ICD-10-CM | POA: Diagnosis not present

## 2021-07-11 DIAGNOSIS — I10 Essential (primary) hypertension: Secondary | ICD-10-CM | POA: Diagnosis not present

## 2021-07-11 NOTE — Progress Notes (Signed)
Subjective:    Patient ID: Lee Barnes, male    DOB: 1951/04/05, 70 y.o.   MRN: [id number]  HPI Documentation for virtual audio and video telecommunications through Caregility encounter: The patient was located at home. 2 patient identifiers used.  The provider was located in the office. The patient did consent to this visit and is aware of possible charges through their insurance for this visit. The other persons participating in this telemedicine service was his wife Time spent on call was 5 minutes and in review of previous records >18 minutes total for counseling and coordination of care. This virtual service is not related to other E/M service within previous 7 days.  He is now getting regular tube feedings and in the process of doing that he has decided to check his blood pressure at the same time.  He did have 1 reading of 59/45 but does note that it tends to fluctuate based on his activity level.  Presently he is on amlodipine.  Review of Systems     Objective:   Physical Exam Alert and in no distress otherwise not examined       Assessment & Plan:  Essential hypertension  G tube feedings (HCC) I explained that it is difficult to determine what those blood pressures remain since he has not checked his blood pressure cuff against ours.  He is also not checking it according to proper protocol.  I explained that he needed to check it in the resting, sitting position and arm at heart level.  Recommend that he only check it once per day and hold onto those readings.  I will see him in about 2 weeks and we might need to readjust his medication.

## 2021-07-16 ENCOUNTER — Ambulatory Visit: Payer: Medicare Other

## 2021-07-16 ENCOUNTER — Other Ambulatory Visit: Payer: Self-pay

## 2021-07-16 DIAGNOSIS — R1312 Dysphagia, oropharyngeal phase: Secondary | ICD-10-CM | POA: Diagnosis not present

## 2021-07-16 DIAGNOSIS — R471 Dysarthria and anarthria: Secondary | ICD-10-CM | POA: Diagnosis not present

## 2021-07-16 DIAGNOSIS — R490 Dysphonia: Secondary | ICD-10-CM

## 2021-07-16 NOTE — Therapy (Signed)
Diagnosis Date Noted   Chronic hyponatremia 02/20/2021   Dysphagia  12/12/2020   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   ACE-inhibitor cough 05/25/2015   Essential hypertension 03/21/2015   Allergic rhinitis 12/14/2013   ED (erectile dysfunction) 12/14/2013   Diverticulosis    History of oropharyngeal cancer 09/18/2010    [facility] ,[place], CCC-SLP  07/16/2021, 1:17 PM  [employer] [address] [place], [place], [address] Phone: [phone]   Fax:  [phone]   Name: Lee Barnes MRN: [id number] Date of Birth: [birth year]  Lee Barnes [address] [place], [place], [address] Phone: [phone]   Fax:  [phone]  Speech Language Pathology Treatment  Patient Details  Name: Lee Barnes MRN: [id number] Date of Birth: 12/19/1950 Referring Provider (SLP): Dr. Jill Alexanders   Encounter Date: 07/16/2021   End of Session - 07/16/21 1317     Visit Number 18    Number of Visits 21    Date for SLP Re-Evaluation 08/10/21    Authorization Type none    SLP Start Time 1104    SLP Stop Time  1145    SLP Time Calculation (min) 41 min    Activity Tolerance Patient tolerated treatment well             Past Medical History:  Diagnosis Date   Allergy to environmental factors    Complication of anesthesia    "vageled" after surgery -overnite stay   Diverticulosis 2008   Heart murmur    hx of in childhood    History of chemotherapy    History of radiation therapy 11/05/10-12/26/10   r base tongue, 7000 cGy 35 sessions   Hypertension    Medication started in May 2016.   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   Pneumonia    hx of 2012   Squamous cell carcinoma    right base of tongue   Tinnitus     Past Surgical History:  Procedure Laterality Date   APPENDECTOMY     COLONOSCOPY     INGUINAL HERNIA REPAIR Bilateral 03/30/2015   Procedure: LAPAROSCOPIC BILATERAL INGUINAL HERNIA REPAIR WITH MESH;  Surgeon: Coralie Keens, MD;  [facility];  Service: General;  Laterality: Bilateral;   INGUINAL HERNIA REPAIR Right [place] Bilateral 03/30/2015   INGUINAL HERNIA REPAIR Left 07/11/2015   INGUINAL HERNIA REPAIR N/A 07/11/2015   Procedure: REPAIR OF LEFT INGUINAL HERNIA WITH MESH;  Surgeon: Coralie Keens, MD;  Location: [facility];  Service: General;  Laterality: N/A;   INSERTION OF MESH N/A 07/11/2015   Procedure: INSERTION OF MESH;  Surgeon: Coralie Keens, MD;  Location: [facility];  Service: General;  Laterality: N/A;    LUMBAR DISC SURGERY     TONSILLECTOMY      There were no vitals filed for this visit.          ADULT SLP TREATMENT - 07/16/21 1308       General Information   Behavior/Cognition Alert;Cooperative;Pleasant mood      Treatment Provided   Treatment provided Dysphagia      Dysphagia Treatment   Temperature Spikes Noted No    Respiratory Status Room air    Oral Cavity - Dentition Adequate natural dentition    Treatment Methods Therapeutic exercise;Patient/caregiver education;Skilled observation;Compensation strategy training    Patient observed directly with PO's Yes    Type of PO's observed Thin liquids    Liquids provided via Cup    Pharyngeal Phase Signs & Symptoms Immediate throat clear;Wet vocal quality;Audible swallow;Multiple swallows;Complaints of residue    Other treatment/comments [name] performed oral care 5 minutes prior to session. Pt tells SLP he has been doing HEP as recommended for approx 2 weeks. SLP suggested pt not have another MBSS for at LEAST 6 weeks and maybe as many as 10 of HEP scope and frequency as recommended. Today pt blew ~60cm H2O with adequate sound and effort level 8/10, and used inhaler 25/25 at 31 cm H2O withcorrect sounds with 8.5/10 effort level. Pt independent with  Diagnosis Date Noted   Chronic hyponatremia 02/20/2021   Dysphagia  12/12/2020   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   ACE-inhibitor cough 05/25/2015   Essential hypertension 03/21/2015   Allergic rhinitis 12/14/2013   ED (erectile dysfunction) 12/14/2013   Diverticulosis    History of oropharyngeal cancer 09/18/2010    [facility] ,[place], CCC-SLP  07/16/2021, 1:17 PM  [employer] [address] [place], [place], [address] Phone: [phone]   Fax:  [phone]   Name: Lee Barnes MRN: [id number] Date of Birth: [birth year]

## 2021-07-17 DIAGNOSIS — E871 Hypo-osmolality and hyponatremia: Secondary | ICD-10-CM | POA: Diagnosis not present

## 2021-07-17 DIAGNOSIS — E875 Hyperkalemia: Secondary | ICD-10-CM | POA: Diagnosis not present

## 2021-07-17 DIAGNOSIS — N182 Chronic kidney disease, stage 2 (mild): Secondary | ICD-10-CM | POA: Diagnosis not present

## 2021-07-17 DIAGNOSIS — R0989 Other specified symptoms and signs involving the circulatory and respiratory systems: Secondary | ICD-10-CM | POA: Diagnosis not present

## 2021-07-24 ENCOUNTER — Other Ambulatory Visit: Payer: Self-pay

## 2021-07-24 ENCOUNTER — Ambulatory Visit: Payer: Medicare Other | Attending: Family Medicine

## 2021-07-24 DIAGNOSIS — R1312 Dysphagia, oropharyngeal phase: Secondary | ICD-10-CM | POA: Diagnosis not present

## 2021-07-24 DIAGNOSIS — R490 Dysphonia: Secondary | ICD-10-CM | POA: Insufficient documentation

## 2021-07-24 DIAGNOSIS — R471 Dysarthria and anarthria: Secondary | ICD-10-CM | POA: Diagnosis not present

## 2021-07-24 NOTE — Therapy (Signed)
Weeks    Status Not Met      SLP  LONG TERM GOAL #4   Title Pt will complete HEP for dysphagia with mod I over 4 sessions    Baseline 06-26-21, 07-09-21    Time 4    Period Weeks    Status On-going   and revised     SLP LONG TERM GOAL #5   Title pt will complete 25 adequate reps of EMST with at least 55 cm H2O and of IMST with at least 25 cm H2O (target at least 60 for EMST and at least 28 for IMST)    Time 4    Period Weeks    Status On-going   and ongoing             Plan - 07/24/21 1219     Clinical Impression Statement [name] cont demonstrating accurate completion of both EMT and IMT. SLP again strongly stressed BID HEP as directed and TID respiratory muscle training. He completed oral care just prior to ST today so SLP provided pt with water during HEP. Pt needs to have something PO for muscle memory and mitigate muscle atrophy so SLP stressed ice chips/water after thorough oral care prior x2-3/day. Continue skilled ST to improve airway protection and reduce risk of aspiration pna.    Speech Therapy Frequency 1x /week    Duration 4 weeks   21 total visits   Treatment/Interventions Aspiration precaution training;Pharyngeal strengthening exercises;Diet toleration management by SLP;Trials of upgraded texture/liquids;Internal/external aids;Patient/family education;Compensatory strategies;Oral motor exercises;Environmental controls;SLP instruction and feedback;Other (comment)    Potential to Achieve Goals Fair    Potential Considerations Co-morbidities;Previous level of function;Severity of impairments             Patient will benefit from skilled therapeutic intervention in order to improve the following deficits and impairments:   Dysphagia, oropharyngeal phase    Problem List Patient Active Problem List   Diagnosis Date Noted   Chronic hyponatremia 02/20/2021   Dysphagia 12/12/2020   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   ACE-inhibitor cough 05/25/2015    Essential hypertension 03/21/2015   Allergic rhinitis 12/14/2013   ED (erectile dysfunction) 12/14/2013   Diverticulosis    History of oropharyngeal cancer 09/18/2010    [facility] ,[place], CCC-SLP  07/24/2021, 12:21 PM  [facility] [address] [place], [place], [address] Phone: [phone]   Fax:  [phone]   Name: AJAX SCHROLL MRN: [id number] Date of Birth: 11-10-1951  Weeks    Status Not Met      SLP  LONG TERM GOAL #4   Title Pt will complete HEP for dysphagia with mod I over 4 sessions    Baseline 06-26-21, 07-09-21    Time 4    Period Weeks    Status On-going   and revised     SLP LONG TERM GOAL #5   Title pt will complete 25 adequate reps of EMST with at least 55 cm H2O and of IMST with at least 25 cm H2O (target at least 60 for EMST and at least 28 for IMST)    Time 4    Period Weeks    Status On-going   and ongoing             Plan - 07/24/21 1219     Clinical Impression Statement [name] cont demonstrating accurate completion of both EMT and IMT. SLP again strongly stressed BID HEP as directed and TID respiratory muscle training. He completed oral care just prior to ST today so SLP provided pt with water during HEP. Pt needs to have something PO for muscle memory and mitigate muscle atrophy so SLP stressed ice chips/water after thorough oral care prior x2-3/day. Continue skilled ST to improve airway protection and reduce risk of aspiration pna.    Speech Therapy Frequency 1x /week    Duration 4 weeks   21 total visits   Treatment/Interventions Aspiration precaution training;Pharyngeal strengthening exercises;Diet toleration management by SLP;Trials of upgraded texture/liquids;Internal/external aids;Patient/family education;Compensatory strategies;Oral motor exercises;Environmental controls;SLP instruction and feedback;Other (comment)    Potential to Achieve Goals Fair    Potential Considerations Co-morbidities;Previous level of function;Severity of impairments             Patient will benefit from skilled therapeutic intervention in order to improve the following deficits and impairments:   Dysphagia, oropharyngeal phase    Problem List Patient Active Problem List   Diagnosis Date Noted   Chronic hyponatremia 02/20/2021   Dysphagia 12/12/2020   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   ACE-inhibitor cough 05/25/2015    Essential hypertension 03/21/2015   Allergic rhinitis 12/14/2013   ED (erectile dysfunction) 12/14/2013   Diverticulosis    History of oropharyngeal cancer 09/18/2010    [facility] ,[place], CCC-SLP  07/24/2021, 12:21 PM  [facility] [address] [place], [place], [address] Phone: [phone]   Fax:  [phone]   Name: AJAX SCHROLL MRN: [id number] Date of Birth: 11-10-1951  [facility] [address] [place], [place], [address] Phone: [phone]   Fax:  [phone]  Speech Language Pathology Treatment  Patient Details  Name: DONTARIO EVETTS MRN: [id number] Date of Birth: 11/06/1951 Referring Provider (SLP): Dr. Jill Alexanders   Encounter Date: 07/24/2021   End of Session - 07/24/21 1218     Visit Number 19    Number of Visits 21    Date for SLP Re-Evaluation 08/10/21    Authorization Type none    SLP Start Time 1106    SLP Stop Time  1145    SLP Time Calculation (min) 39 min    Activity Tolerance Patient tolerated treatment well             Past Medical History:  Diagnosis Date   Allergy to environmental factors    Complication of anesthesia    "vageled" after surgery -overnite stay   Diverticulosis 2008   Heart murmur    hx of in childhood    History of chemotherapy    History of radiation therapy 11/05/10-12/26/10   r base tongue, 7000 cGy 35 sessions   Hypertension    Medication started in May 2016.   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   Tinnitus     Past Surgical History:  Procedure Laterality Date   APPENDECTOMY     COLONOSCOPY     INGUINAL HERNIA REPAIR Bilateral 03/30/2015   Procedure: LAPAROSCOPIC BILATERAL INGUINAL HERNIA REPAIR WITH MESH;  Surgeon: Coralie Keens, MD;  [facility];  Service: General;  Laterality: Bilateral;   INGUINAL HERNIA REPAIR Right [place] Bilateral 03/30/2015   INGUINAL HERNIA REPAIR Left 07/11/2015   INGUINAL HERNIA REPAIR N/A 07/11/2015   Procedure: REPAIR OF LEFT INGUINAL HERNIA WITH MESH;  Surgeon: Coralie Keens, MD;  Location: [facility];  Service: General;  Laterality: N/A;   INSERTION OF MESH N/A 07/11/2015   Procedure: INSERTION OF MESH;  Surgeon: Coralie Keens, MD;  Location: [facility];  Service: General;  Laterality: N/A;   LUMBAR  DISC SURGERY     TONSILLECTOMY      There were no vitals filed for this visit.   Subjective Assessment - 07/24/21 1108     Subjective "I had a couple days where I didn't feel so good so I probably slacked off more than I should. "    Currently in Pain? No/denies                   ADULT SLP TREATMENT - 07/24/21 1108       General Information   Behavior/Cognition Alert;Cooperative;Pleasant mood      Treatment Provided   Treatment provided Dysphagia      Dysphagia Treatment   Temperature Spikes Noted No    Respiratory Status Room air    Oral Cavity - Dentition Adequate natural dentition    Treatment Methods Therapeutic exercise;Patient/caregiver education;Skilled observation;Compensation strategy training    Patient observed directly with PO's Yes    Type of PO's observed Thin liquids    Liquids provided via Cup    Pharyngeal Phase Signs & Symptoms Immediate throat clear;Delayed cough    Other treatment/comments I broke down yesterday and had a life saver - I brushed my teeth right after. I had a real bad taste in my mouth." Pt performed oral care just prior to ST

## 2021-07-25 ENCOUNTER — Ambulatory Visit (INDEPENDENT_AMBULATORY_CARE_PROVIDER_SITE_OTHER): Payer: Medicare Other | Admitting: Family Medicine

## 2021-07-25 VITALS — BP 154/86 | HR 98 | Temp 97.5°F | Wt 160.8 lb

## 2021-07-25 DIAGNOSIS — Z8701 Personal history of pneumonia (recurrent): Secondary | ICD-10-CM

## 2021-07-25 DIAGNOSIS — E871 Hypo-osmolality and hyponatremia: Secondary | ICD-10-CM | POA: Diagnosis not present

## 2021-07-25 DIAGNOSIS — I10 Essential (primary) hypertension: Secondary | ICD-10-CM | POA: Diagnosis not present

## 2021-07-25 DIAGNOSIS — Z85819 Personal history of malignant neoplasm of unspecified site of lip, oral cavity, and pharynx: Secondary | ICD-10-CM

## 2021-07-25 DIAGNOSIS — Z931 Gastrostomy status: Secondary | ICD-10-CM | POA: Diagnosis not present

## 2021-07-25 LAB — BASIC METABOLIC PANEL
BUN/Creatinine Ratio: 23 (ref 10–24)
BUN: 22 mg/dL (ref 8–27)
CO2: 25 mmol/L (ref 20–29)
Calcium: 9.8 mg/dL (ref 8.6–10.2)
Chloride: 90 mmol/L — ABNORMAL LOW (ref 96–106)
Creatinine, Ser: 0.96 mg/dL (ref 0.76–1.27)
Glucose: 87 mg/dL (ref 65–99)
Potassium: 4.9 mmol/L (ref 3.5–5.2)
Sodium: 130 mmol/L — ABNORMAL LOW (ref 134–144)
eGFR: 86 mL/min/{1.73_m2} (ref >59)

## 2021-07-25 NOTE — Progress Notes (Signed)
Subjective:    Patient ID: Lee Barnes, male    DOB: November 03, 1951, 70 y.o.   MRN: [id number]  HPI He is here for an interval evaluation.  He continues to be fed by his G-tube which keeps his fluid status and weight have in good range.  He did recently read a report concerning the G-tube and interpreted in the setting probably not being able to stop using it.  If I explained to him that actually this is been very positive and that his weight and fluid status seems to be doing much better.  He was recently seen by renal and recommended that has not amlodipine be cut down to 2.5 mg.  His blood pressure does tend to fluctuate secondary to his neck surgery.  He and his wife are quite aware of this.  He does have a blood pressure machine and did bring it in. Also he was recently seen by his other specialist and the report indicates that.  Like need for routine blood work to keep the inappropriate ADH monitored appropriately.  Review of Systems     Objective:   Physical Exam Alert and in no distress.  His blood pressure cuff was compared to ours and is entirely accurate.       Assessment & Plan:  G tube feedings (HCC)  Essential hypertension  Chronic hyponatremia - Plan: Basic Metabolic Panel  History of aspiration pneumonia  History of oropharyngeal cancer I reinforced the fact that the G-tube feedings seem to help stabilize him.  I agree with  electrolyte checks to be done on a regular basis rather than as needed.  He is to monitor his blood pressure.  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  The idea is to keep his blood pressure from bottoming out more than worrying about the top number although we cannot ignore that either.

## 2021-07-30 ENCOUNTER — Ambulatory Visit: Payer: Medicare Other

## 2021-07-30 ENCOUNTER — Other Ambulatory Visit: Payer: Self-pay

## 2021-07-30 DIAGNOSIS — R490 Dysphonia: Secondary | ICD-10-CM

## 2021-07-30 DIAGNOSIS — R471 Dysarthria and anarthria: Secondary | ICD-10-CM | POA: Diagnosis not present

## 2021-07-30 DIAGNOSIS — R1312 Dysphagia, oropharyngeal phase: Secondary | ICD-10-CM

## 2021-07-30 NOTE — Therapy (Signed)
[facility] [address] [place], [place], [address] Phone: [phone]   Fax:  [phone]  Speech Language Pathology Treatment/ Medicare progress note  Patient Details  Name: Lee Barnes MRN: [id number] Date of Birth: 1951/03/04 Referring Provider (SLP): Dr. Jill Alexanders   Encounter Date: 07/30/2021   End of Session - 07/30/21 1316     Visit Number 20    Number of Visits 21    Date for SLP Re-Evaluation 08/10/21    Authorization Type none    SLP Start Time 1103    SLP Stop Time  1145    SLP Time Calculation (min) 42 min    Activity Tolerance Patient tolerated treatment well             Past Medical History:  Diagnosis Date   Allergy to environmental factors    Complication of anesthesia    "vageled" after surgery -overnite stay   Diverticulosis 2008   Heart murmur    hx of in childhood    History of chemotherapy    History of radiation therapy 11/05/10-12/26/10   r base tongue, 7000 cGy 35 sessions   Hypertension    Medication started in May 2016.   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   Tinnitus     Past Surgical History:  Procedure Laterality Date   APPENDECTOMY     COLONOSCOPY     INGUINAL HERNIA REPAIR Bilateral 03/30/2015   Procedure: LAPAROSCOPIC BILATERAL INGUINAL HERNIA REPAIR WITH MESH;  Surgeon: Coralie Keens, MD;  Location: [facility];  Service: General;  Laterality: Bilateral;   INGUINAL HERNIA REPAIR Right [place] Bilateral 03/30/2015   INGUINAL HERNIA REPAIR Left 07/11/2015   INGUINAL HERNIA REPAIR N/A 07/11/2015   Procedure: REPAIR OF LEFT INGUINAL HERNIA WITH MESH;  Surgeon: Coralie Keens, MD;  Location: [facility];  Service: General;  Laterality: N/A;   INSERTION OF MESH N/A 07/11/2015   Procedure: INSERTION OF MESH;  Surgeon: Coralie Keens, MD;  Location: [facility];  Service: General;   Laterality: N/A;   LUMBAR DISC SURGERY     TONSILLECTOMY      There were no vitals filed for this visit.   Speech Therapy Progress Note  Dates of Reporting Period: 05/14/21  to present  Subjective Statement: Pt seen for 20 sessions targeting dysphagia.  Objective: See below  Goal Update: See below  Plan: Pt will have follow up MBSS week of 08-20-21, if agreed  Reason Skilled Services are Required: Pt needs objective swallow assessment prior to d/c, or a plan of care change. Will be determined after MBSS.     Subjective Assessment - 07/30/21 1111     Subjective Pt reports his MD told him to monitor his BP BID and take BP meds PRN                   ADULT SLP TREATMENT - 07/30/21 1115       General Information   Behavior/Cognition Alert;Cooperative;Pleasant mood      Treatment Provided   Treatment provided Dysphagia      Dysphagia Treatment   Temperature Spikes Noted No    Respiratory Status Room air    Oral Cavity - Dentition Adequate natural dentition    Treatment Methods Skilled observation;Therapeutic exercise;Patient/caregiver education    Patient observed directly with PO's Yes  Weeks    Status On-going   and revised   Target Date 08/10/21      SLP LONG TERM GOAL #5   Title  pt will complete 25 adequate reps of EMST with at least 55 cm H2O and of IMST with at least 25 cm H2O (target at least 60 for EMST and at least 28 for IMST)    Time 4    Period Weeks    Status On-going   and ongoing   Target Date 08/10/21              Plan - 07/30/21 1316     Clinical Impression Statement [name] cont demonstrating accurate completion of both EMT and IMT. SLP again strongly stressed BID HEP as directed and TID respiratory muscle training. He completed oral care just prior to ST today so SLP provided pt with water during HEP. Pt needs to have something PO for muscle memory and mitigate muscle atrophy so SLP stressed ice chips/water after thorough oral care prior x2-3/day. Continue skilled ST to improve airway protection and reduce risk of aspiration pna.    Speech Therapy Frequency 1x /week    Duration 4 weeks   21 total visits   Treatment/Interventions Aspiration precaution training;Pharyngeal strengthening exercises;Diet toleration management by SLP;Trials of upgraded texture/liquids;Internal/external aids;Patient/family education;Compensatory strategies;Oral motor exercises;Environmental controls;SLP instruction and feedback;Other (comment)    Potential to Achieve Goals Fair    Potential Considerations Co-morbidities;Previous level of function;Severity of impairments             Patient will benefit from skilled therapeutic intervention in order to improve the following deficits and impairments:   Dysphagia, oropharyngeal phase  Dysarthria and anarthria  Voice hoarseness    Problem List Patient Active Problem List   Diagnosis Date Noted   Chronic hyponatremia 02/20/2021   Dysphagia 12/12/2020   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   ACE-inhibitor cough 05/25/2015   Essential hypertension 03/21/2015   Allergic rhinitis 12/14/2013   ED (erectile dysfunction) 12/14/2013   Diverticulosis    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 Jill Alexanders   Encounter Date: 07/30/2021   End of Session - 07/30/21 1316     Visit Number 20    Number of Visits 21    Date for SLP Re-Evaluation 08/10/21    Authorization Type none    SLP Start Time 1103    SLP Stop Time  1145    SLP Time Calculation (min) 42 min    Activity Tolerance Patient tolerated treatment well             Past Medical History:  Diagnosis Date   Allergy to environmental factors    Complication of anesthesia    "vageled" after surgery -overnite stay   Diverticulosis 2008   Heart murmur    hx of in childhood    History of chemotherapy    History of radiation therapy 11/05/10-12/26/10   r base tongue, 7000 cGy 35 sessions   Hypertension    Medication started in May 2016.   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   Tinnitus     Past Surgical History:  Procedure Laterality Date   APPENDECTOMY     COLONOSCOPY     INGUINAL HERNIA REPAIR Bilateral 03/30/2015   Procedure: LAPAROSCOPIC BILATERAL INGUINAL HERNIA REPAIR WITH MESH;  Surgeon: Coralie Keens, MD;  Location: [facility];  Service: General;  Laterality: Bilateral;   INGUINAL HERNIA REPAIR Right [place] Bilateral 03/30/2015   INGUINAL HERNIA REPAIR Left 07/11/2015   INGUINAL HERNIA REPAIR N/A 07/11/2015   Procedure: REPAIR OF LEFT INGUINAL HERNIA WITH MESH;  Surgeon: Coralie Keens, MD;  Location: [facility];  Service: General;  Laterality: N/A;   INSERTION OF MESH N/A 07/11/2015   Procedure: INSERTION OF MESH;  Surgeon: Coralie Keens, MD;  Location: [facility];  Service: General;   Laterality: N/A;   LUMBAR DISC SURGERY     TONSILLECTOMY      There were no vitals filed for this visit.   Speech Therapy Progress Note  Dates of Reporting Period: 05/14/21  to present  Subjective Statement: Pt seen for 20 sessions targeting dysphagia.  Objective: See below  Goal Update: See below  Plan: Pt will have follow up MBSS week of 08-20-21, if agreed  Reason Skilled Services are Required: Pt needs objective swallow assessment prior to d/c, or a plan of care change. Will be determined after MBSS.     Subjective Assessment - 07/30/21 1111     Subjective Pt reports his MD told him to monitor his BP BID and take BP meds PRN                   ADULT SLP TREATMENT - 07/30/21 1115       General Information   Behavior/Cognition Alert;Cooperative;Pleasant mood      Treatment Provided   Treatment provided Dysphagia      Dysphagia Treatment   Temperature Spikes Noted No    Respiratory Status Room air    Oral Cavity - Dentition Adequate natural dentition    Treatment Methods Skilled observation;Therapeutic exercise;Patient/caregiver education    Patient observed directly with PO's Yes

## 2021-08-01 ENCOUNTER — Telehealth: Payer: Self-pay | Admitting: Family Medicine

## 2021-08-01 NOTE — Telephone Encounter (Signed)
Garald Balding Speech Pathologist with [facility] [phone] called He has been working with the patient is his swallowing therapy and he states patient needs follow up Barium swallow eval. Wants to get test done the week of October 3rd.    Needs referral order faxed to [phone] or can call [phone]  He will be in the office Thursday morning  and then back in the office Tuesday if you have any questions

## 2021-08-03 ENCOUNTER — Other Ambulatory Visit: Payer: Self-pay

## 2021-08-03 DIAGNOSIS — Z85819 Personal history of malignant neoplasm of unspecified site of lip, oral cavity, and pharynx: Secondary | ICD-10-CM

## 2021-08-03 DIAGNOSIS — R1311 Dysphagia, oral phase: Secondary | ICD-10-CM

## 2021-08-03 DIAGNOSIS — Z8701 Personal history of pneumonia (recurrent): Secondary | ICD-10-CM

## 2021-08-03 NOTE — Telephone Encounter (Signed)
Pt order in the system and called the office to see if this was ok. LVM for them to call back if they needed the hand written one. Lee Barnes

## 2021-08-08 ENCOUNTER — Other Ambulatory Visit: Payer: Self-pay | Admitting: Family Medicine

## 2021-08-08 DIAGNOSIS — R1311 Dysphagia, oral phase: Secondary | ICD-10-CM

## 2021-08-13 DIAGNOSIS — R633 Feeding difficulties, unspecified: Secondary | ICD-10-CM | POA: Diagnosis not present

## 2021-08-13 DIAGNOSIS — R197 Diarrhea, unspecified: Secondary | ICD-10-CM | POA: Diagnosis not present

## 2021-08-16 ENCOUNTER — Ambulatory Visit (HOSPITAL_COMMUNITY)
Admission: RE | Admit: 2021-08-16 | Discharge: 2021-08-16 | Disposition: A | Discharge status: Home / Self Care | Payer: Medicare Other | Source: Ambulatory Visit | Attending: Family Medicine | Admitting: Family Medicine

## 2021-08-16 ENCOUNTER — Other Ambulatory Visit: Payer: Self-pay

## 2021-08-16 DIAGNOSIS — Z8701 Personal history of pneumonia (recurrent): Secondary | ICD-10-CM | POA: Diagnosis not present

## 2021-08-16 DIAGNOSIS — Z85819 Personal history of malignant neoplasm of unspecified site of lip, oral cavity, and pharynx: Secondary | ICD-10-CM

## 2021-08-16 DIAGNOSIS — R1311 Dysphagia, oral phase: Secondary | ICD-10-CM | POA: Insufficient documentation

## 2021-08-16 NOTE — Progress Notes (Signed)
Modified Barium Swallow Progress Note  Patient Details  Name: Lee Barnes MRN: [id number] Date of Birth: [birth year]  Today's Date: 08/16/2021  Modified Barium Swallow completed.  Full report located under Chart Review in the Imaging Section.  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 Pt has a moderate oral dysphagia with right sides lingual, buccal, palatal and base of tongue residue with solids with decreased lingual and palatal stripping for bolus propulsion. If solids remain on the posterior right lingual surface they are persistently pushed back into mouth instead of posteriorally during swallow attempts. He does not sense residue given sensory impairment. He uses a liquid bolus to transit residue. With liquids, pt has a very good compensatory mechanism including early and sustained laryngeal elevation and glottic closure over mutliple swallows. Pt does not have sufficient hyoid excursion and minimal epiglottic movement so liquids enter the vesitubule above closed glottis and are ejected with second swallow. Pt elicits a soft throat clear to eject penetrate, but if it is not forceful enough, penetrate will not fully eject and pt will aspirate post swallow as liquids are released into the subglottis. SLP used video feedback to demonstrate this to pt and emphasize need for harder throat clear, which was successful in eliminating aspiration with thin liquids. Pt had increased oral and pharyngeal residue with thickened liquids so they are not recommended. Pt is able to transit solids with a liquid wash, but this significantly complicates pts motor planning for swallowing and increases risk of aspiration of thin and solid particulate matter. He  does not aspirate during these attempts, but risk would be higher. Pt is advised to start drinking water throughout the day with recommended technique with ongoing oral care and exercises. He could consider occasional bites of food for pleasure if this is tolerated well and advance from there. Explained increased risk of aspiration pna with adding solids. Pt should ensure tolerance of thins before advancing, though potential is good with OP SLP guidance.   Swallow Evaluation Recommendations       SLP Diet Recommendations: Thin liquid (water)   Liquid Administration via: Cup   Medication Administration: Via alternative means   Supervision: Patient able to self feed   Compensations: Slow rate;Small sips/bites;Multiple dry swallows after each bite/sip;Clear throat after each swallow (hard throat clear)               Herbie Baltimore, MA CCC-SLP  Acute Rehabilitation Services Office [phone]  Delayza Lungren, Katherene Ponto 08/16/2021,2:52 PM

## 2021-08-22 ENCOUNTER — Ambulatory Visit: Payer: Medicare Other | Attending: Family Medicine

## 2021-08-22 ENCOUNTER — Other Ambulatory Visit: Payer: Self-pay

## 2021-08-22 DIAGNOSIS — R490 Dysphonia: Secondary | ICD-10-CM | POA: Diagnosis not present

## 2021-08-22 DIAGNOSIS — R1312 Dysphagia, oropharyngeal phase: Secondary | ICD-10-CM | POA: Insufficient documentation

## 2021-08-22 DIAGNOSIS — R471 Dysarthria and anarthria: Secondary | ICD-10-CM | POA: Insufficient documentation

## 2021-08-22 NOTE — Therapy (Signed)
[facility] [address] [place], [place], [address] Phone: [phone]   Fax:  [phone]  Speech Language Pathology Treatment/Renewal summary  Patient Details  Name: Lee Barnes MRN: [id number] Date of Birth: 1951/02/27 Referring Provider (SLP): Dr. Sharlot Gowda   Encounter Date: 08/22/2021   End of Session - 08/22/21 1223     Visit Number 21    Number of Visits 27    Date for SLP Re-Evaluation 11/20/21    Authorization Type none    SLP Start Time 1105    SLP Stop Time  1145    SLP Time Calculation (min) 40 min    Activity Tolerance Patient tolerated treatment well             Past Medical History:  Diagnosis Date   Allergy to environmental factors    Complication of anesthesia    "vageled" after surgery -overnite stay   Diverticulosis 2008   Heart murmur    hx of in childhood    History of chemotherapy    History of radiation therapy 11/05/10-12/26/10   r base tongue, 7000 cGy 35 sessions   Hypertension    Medication started in May 2016.   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   Tinnitus     Past Surgical History:  Procedure Laterality Date   APPENDECTOMY     COLONOSCOPY     INGUINAL HERNIA REPAIR Bilateral 03/30/2015   Procedure: LAPAROSCOPIC BILATERAL INGUINAL HERNIA REPAIR WITH MESH;  Surgeon: Abigail Miyamoto, MD;  Location: [facility];  Service: General;  Laterality: Bilateral;   INGUINAL HERNIA REPAIR Right 1960   INGUINAL HERNIA REPAIR Bilateral 03/30/2015   INGUINAL HERNIA REPAIR Left 07/11/2015   INGUINAL HERNIA REPAIR N/A 07/11/2015   Procedure: REPAIR OF LEFT INGUINAL HERNIA WITH MESH;  Surgeon: Abigail Miyamoto, MD;  Location: [facility];  Service: General;  Laterality: N/A;   INSERTION OF MESH N/A 07/11/2015   Procedure: INSERTION OF MESH;  Surgeon: Abigail Miyamoto, MD;  Location: [facility];  Service: General;   Laterality: N/A;   LUMBAR DISC SURGERY     TONSILLECTOMY      There were no vitals filed for this visit.          ADULT SLP TREATMENT - 08/22/21 1125       General Information   Behavior/Cognition Alert;Cooperative;Pleasant mood      Treatment Provided   Treatment provided Dysphagia      Dysphagia Treatment   Temperature Spikes Noted No    Respiratory Status Room air    Oral Cavity - Dentition Adequate natural dentition    Treatment Methods Patient/caregiver education;Compensation strategy training    Patient observed directly with PO's No    Other treatment/comments Pt returns today after MBSS last week. He has been having ~4 oz water a few times a week for the past two weeks. No overt s/sx aspiration PNA at this time. SLP cleared pt for 4 oz. liquids of his desire x2-3/day. SLP and pt talked about pt will need to weigh the risk and decide if solids are going to be a viable option for him. SLP suggested liquids only until Thanksgiving to lessen/eliminate atrophy, and then maybe, if pt has tolerated liquids well, to try some pureed and then advance, if pt tolerates, to soft solids such as gravy-soaked biscuits (interior, not the hard exterior). SLP made sure  pt knew possibility of solids was for the future, more like near the new year. See "pt instructions" for education today, pt acknowledged understanding. Pt agreed every two weeks would be appropriate for follow up at this time.      Assessment / Recommendations / Plan   Plan --   every other week ST     Progression Toward Goals   Progression toward goals Progressing toward goals              SLP Education - 08/22/21 1702     Education Details see pt instructions    Person(s) Educated Patient    Methods Explanation;Handout    Comprehension Verbalized understanding              SLP Short Term Goals - 07/09/21 1145       SLP SHORT TERM GOAL #1   Title Pt will follow swallow precautions with rare min A over  2 sessions    Time 1    Period Weeks    Status Not Met      SLP SHORT TERM GOAL #2   Title Pt will complete HEP for dysphagia with rare min A over 2 sessions    Baseline 04/02/21    Time 1    Period Weeks    Status Partially Met      SLP SHORT TERM GOAL #3   Title Pt will complete RMT HEP of 3 sets of 25 repetitions of IMT and EMT at 60-70% of MIP and MEP 5/7 days for 4 weeks    Time 1    Period Weeks    Status Not Met              SLP Long Term Goals - 08/22/21 1705       SLP LONG TERM GOAL #1   Title Pt will follow swallow precautions with mod I over 2 sessions starting 08-22-21    Baseline --    Time 12    Status On-going    Target Date 11/20/21      SLP LONG TERM GOAL #2   Title Pt will complete HEP for dysphagia with mod I over 2 sessions    Baseline 05/02/21, 05/07/21    Status Achieved      SLP LONG TERM GOAL #3   Title Pt will demonstrate improved airway protection, glottal closure by not contracting aspiration pna over 12 weeks.    Time 12    Period Weeks    Status On-going   from "not met"   Target Date 11/20/21      SLP LONG TERM GOAL #4   Title Pt will complete HEP for dysphagia with mod I over 3 sessions, beginning 08-22-21    Time 12    Period Weeks    Status On-going   and continue   Target Date 11/20/21      SLP LONG TERM GOAL #5   Title pt will complete 25 adequate reps of EMST with at least 55 cm H2O and of IMST with at least 25 cm H2O (target at least 60 for EMST and at least 28 for IMST)    Time 12    Period Weeks    Status On-going   and ongoing   Target Date 11/20/21              Plan - 08/22/21 1703     Clinical Impression Statement Lee Barnes cont copmletion of EMST and IMST as well as swallow HEP at  home regularly. See daily note for more details. Pt needs to have something PO for muscle memory and mitigate muscle atrophy so SLP stressed 4 oz liquids x2-3/day after thorough oral care prior. Recent MBSS cleared pt for thin liquids with  advancement as clinicially indicated under guidance of SLP. Continued skilled ST remains necessary to improve airway protection and reduce risk of aspiration pna.    Speech Therapy Frequency --   every other week   Duration --   90 days - 27 total visits   Treatment/Interventions Aspiration precaution training;Pharyngeal strengthening exercises;Diet toleration management by SLP;Trials of upgraded texture/liquids;Internal/external aids;Patient/family education;Compensatory strategies;Oral motor exercises;Environmental controls;SLP instruction and feedback;Other (comment)    Potential to Achieve Goals Fair    Potential Considerations Co-morbidities;Previous level of function;Severity of impairments             Patient will benefit from skilled therapeutic intervention in order to improve the following deficits and impairments:   Dysphagia, oropharyngeal phase  Dysarthria and anarthria  Voice hoarseness    Problem List Patient Active Problem List   Diagnosis Date Noted   Chronic hyponatremia 02/20/2021   Dysphagia 12/12/2020   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   ACE-inhibitor cough 05/25/2015   Essential hypertension 03/21/2015   Allergic rhinitis 12/14/2013   ED (erectile dysfunction) 12/14/2013   Diverticulosis    History of oropharyngeal cancer 09/18/2010    [facility] ,MS, CCC-SLP  08/22/2021, 5:12 PM  [facility] [address] [place], [place], [address] Phone: [phone]   Fax:  [phone]   Name: Lee Barnes MRN: [id number] Date of Birth: 1951/08/24

## 2021-08-22 NOTE — Patient Instructions (Addendum)
  When you have liquids, do not let more than 15 minutes pass between sips. If you do, you should do oral care again prior to more sips.  Swallow hard 2-3 times, HARD THROAT CLEAR, and swallow again - FOR EVERY SIP.  Have at LEAST 8 oz/day of liquids. If you feel tired when drinking, STOP.   Overall, through the tube and by mouth you need at least 70 oz/ non-caffeinated liquids per day to help your voice.  I don't recommend trying anything solid until around Thanksgiving.  Continue the exercises once a day, or get at least 20-25 reps of the exercises you need to swallow.

## 2021-09-05 ENCOUNTER — Ambulatory Visit: Payer: Medicare Other

## 2021-09-05 ENCOUNTER — Other Ambulatory Visit: Payer: Self-pay

## 2021-09-05 DIAGNOSIS — R471 Dysarthria and anarthria: Secondary | ICD-10-CM

## 2021-09-05 DIAGNOSIS — R490 Dysphonia: Secondary | ICD-10-CM

## 2021-09-05 DIAGNOSIS — R1312 Dysphagia, oropharyngeal phase: Secondary | ICD-10-CM | POA: Diagnosis not present

## 2021-09-05 NOTE — Patient Instructions (Signed)
You want to blow or inhale 20/25 reps with a strong "bike pump sound" with an effort level of 7/10, where 10 = most effort possible.  If you are blowing or inhaling 20/25 reps with a strong "bike pump sound" with LESS than 7/10 effort level adjust the blower by a quarter turn, or the inhaler by one cm H2O adjustment. Keep adjusting until you blow 20/25 with good bike pump sound and the effort is at 7/10 level.

## 2021-09-05 NOTE — Therapy (Signed)
[facility] [address] [place], [place], [address] Phone: [phone]   Fax:  [phone]  Speech Language Pathology Treatment  Patient Details  Name: Lee Barnes MRN: [id number] Date of Birth: 09-05-1951 Referring Provider (SLP): Dr. Jill Alexanders   Encounter Date: 09/05/2021   End of Session - 09/05/21 1405     Visit Number 22    Number of Visits 27    Date for SLP Re-Evaluation 11/20/21    Authorization Type none    SLP Start Time 1019    SLP Stop Time  1055    SLP Time Calculation (min) 36 min    Activity Tolerance Patient tolerated treatment well             Past Medical History:  Diagnosis Date   Allergy to environmental factors    Complication of anesthesia    "vageled" after surgery -overnite stay   Diverticulosis 2008   Heart murmur    hx of in childhood    History of chemotherapy    History of radiation therapy 11/05/10-12/26/10   r base tongue, 7000 cGy 35 sessions   Hypertension    Medication started in May 2016.   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   Tinnitus     Past Surgical History:  Procedure Laterality Date   APPENDECTOMY     COLONOSCOPY     INGUINAL HERNIA REPAIR Bilateral 03/30/2015   Procedure: LAPAROSCOPIC BILATERAL INGUINAL HERNIA REPAIR WITH MESH;  Surgeon: Coralie Keens, MD;  Location: [facility];  Service: General;  Laterality: Bilateral;   INGUINAL HERNIA REPAIR Right [place] Bilateral 03/30/2015   INGUINAL HERNIA REPAIR Left 07/11/2015   INGUINAL HERNIA REPAIR N/A 07/11/2015   Procedure: REPAIR OF LEFT INGUINAL HERNIA WITH MESH;  Surgeon: Coralie Keens, MD;  Location: [facility];  Service: General;  Laterality: N/A;   INSERTION OF MESH N/A 07/11/2015   Procedure: INSERTION OF MESH;  Surgeon: Coralie Keens, MD;  Location: [facility];  Service: General;  Laterality: N/A;   LUMBAR DISC  SURGERY     TONSILLECTOMY      There were no vitals filed for this visit.   Subjective Assessment - 09/05/21 1336     Subjective Arrives today with concern about his voice - states at times is aphonic.    Currently in Pain? No/denies               SLP Evaluation [facility] - 09/05/21 1020       SLP Visit Information   SLP Received On 09/05/21                ADULT SLP TREATMENT - 09/05/21 1020       General Information   Behavior/Cognition Alert;Cooperative;Pleasant mood      Treatment Provided   Treatment provided Dysphagia      Dysphagia Treatment   Temperature Spikes Noted No    Respiratory Status Room air    Oral Cavity - Dentition Adequate natural dentition    Treatment Methods Patient/caregiver education;Compensation strategy training    Patient observed directly with PO's No    Type of PO's observed Thin liquids   rarely with HEP "2 drops"   Liquids provided via Cup    Pharyngeal Phase Signs & Symptoms Other (comment)   intermittent delayed throat clear   Other treatment/comments  [facility] [address] [place], [place], [address] Phone: [phone]   Fax:  [phone]  Speech Language Pathology Treatment  Patient Details  Name: Lee Barnes MRN: [id number] Date of Birth: 09-05-1951 Referring Provider (SLP): Dr. Jill Alexanders   Encounter Date: 09/05/2021   End of Session - 09/05/21 1405     Visit Number 22    Number of Visits 27    Date for SLP Re-Evaluation 11/20/21    Authorization Type none    SLP Start Time 1019    SLP Stop Time  1055    SLP Time Calculation (min) 36 min    Activity Tolerance Patient tolerated treatment well             Past Medical History:  Diagnosis Date   Allergy to environmental factors    Complication of anesthesia    "vageled" after surgery -overnite stay   Diverticulosis 2008   Heart murmur    hx of in childhood    History of chemotherapy    History of radiation therapy 11/05/10-12/26/10   r base tongue, 7000 cGy 35 sessions   Hypertension    Medication started in May 2016.   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   Tinnitus     Past Surgical History:  Procedure Laterality Date   APPENDECTOMY     COLONOSCOPY     INGUINAL HERNIA REPAIR Bilateral 03/30/2015   Procedure: LAPAROSCOPIC BILATERAL INGUINAL HERNIA REPAIR WITH MESH;  Surgeon: Coralie Keens, MD;  [facility];  Service: General;  Laterality: Bilateral;   INGUINAL HERNIA REPAIR Right [place] Bilateral 03/30/2015   INGUINAL HERNIA REPAIR Left 07/11/2015   INGUINAL HERNIA REPAIR N/A 07/11/2015   Procedure: REPAIR OF LEFT INGUINAL HERNIA WITH MESH;  Surgeon: Coralie Keens, MD;  Location: [facility];  Service: General;  Laterality: N/A;   INSERTION OF MESH N/A 07/11/2015   Procedure: INSERTION OF MESH;  Surgeon: Coralie Keens, MD;  Location: [facility];  Service: General;  Laterality: N/A;   LUMBAR DISC  SURGERY     TONSILLECTOMY      There were no vitals filed for this visit.   Subjective Assessment - 09/05/21 1336     Subjective Arrives today with concern about his voice - states at times is aphonic.    Currently in Pain? No/denies               SLP Evaluation [facility] - 09/05/21 1020       SLP Visit Information   SLP Received On 09/05/21                ADULT SLP TREATMENT - 09/05/21 1020       General Information   Behavior/Cognition Alert;Cooperative;Pleasant mood      Treatment Provided   Treatment provided Dysphagia      Dysphagia Treatment   Temperature Spikes Noted No    Respiratory Status Room air    Oral Cavity - Dentition Adequate natural dentition    Treatment Methods Patient/caregiver education;Compensation strategy training    Patient observed directly with PO's No    Type of PO's observed Thin liquids   rarely with HEP "2 drops"   Liquids provided via Cup    Pharyngeal Phase Signs & Symptoms Other (comment)   intermittent delayed throat clear   Other treatment/comments  LONG TERM GOAL #5   Title pt will complete 25 adequate reps of EMST with at least 55 cm H2O and of IMST with at least 25 cm H2O (target at least 60 for EMST and at least 28 for  IMST) in 3 sessions    Time 12    Period Weeks    Status Revised   and ongoing   Target Date 11/20/21              Plan - 09/05/21 1406     Clinical Impression Statement Lee Barnes reports he has been inconsistent since last session with EMST and IMST as well as swallow HEP. See daily note for more details. He expanded his liquids since last session with two Cokes in teh last 5 days. No overt s/sx aspiration PNA today. He indicated concern for his voice (periodic aphonia) today adn SLP encouraged a follow up with ENT to diagnose. Pt needs to have something PO for muscle memory and mitigate muscle atrophy so SLP stressed 4 oz liquids x2-3/day after thorough oral care prior. Recent MBSS cleared pt for thin liquids with advancement as clinicially indicated under guidance of SLP. Continued skilled ST remains necessary to improve airway protection and reduce risk of aspiration pna.    Speech Therapy Frequency --   every other week   Duration --   90 days - 27 total visits   Treatment/Interventions Aspiration precaution training;Pharyngeal strengthening exercises;Diet toleration management by SLP;Trials of upgraded texture/liquids;Internal/external aids;Patient/family education;Compensatory strategies;Oral motor exercises;Environmental controls;SLP instruction and feedback;Other (comment)    Potential to Achieve Goals Fair    Potential Considerations Co-morbidities;Previous level of function;Severity of impairments             Patient will benefit from skilled therapeutic intervention in order to improve the following deficits and impairments:   Dysphagia, oropharyngeal phase  Voice hoarseness  Dysarthria and anarthria    Problem List Patient Active Problem List   Diagnosis Date Noted   Chronic hyponatremia 02/20/2021   Dysphagia 12/12/2020   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 [place], [place], [address] Phone: [phone]   Fax:  [phone]   Name: Lee Barnes MRN: [id number] Date of Birth: 10-06-1951

## 2021-09-07 ENCOUNTER — Telehealth: Payer: Self-pay

## 2021-09-07 DIAGNOSIS — R1311 Dysphagia, oral phase: Secondary | ICD-10-CM

## 2021-09-07 NOTE — Telephone Encounter (Signed)
Pt & wife called and states his ENT Dr. Lucia Gaskins is retiring and pt voice getting worse and needs referral to new ENT Lind Guest with [facility] t# [phone], his speech therapist recommended this doctor.

## 2021-09-10 DIAGNOSIS — R197 Diarrhea, unspecified: Secondary | ICD-10-CM | POA: Diagnosis not present

## 2021-09-10 DIAGNOSIS — R633 Feeding difficulties, unspecified: Secondary | ICD-10-CM | POA: Diagnosis not present

## 2021-09-10 NOTE — Telephone Encounter (Signed)
Done KH 

## 2021-09-10 NOTE — Telephone Encounter (Signed)
Sending to [facility] for referral

## 2021-09-14 ENCOUNTER — Other Ambulatory Visit: Payer: Self-pay

## 2021-09-14 ENCOUNTER — Emergency Department (HOSPITAL_COMMUNITY)
Admission: EM | Admit: 2021-09-14 | Discharge: 2021-09-15 | Disposition: A | Discharge status: Home / Self Care | Payer: Medicare Other | Attending: Emergency Medicine | Admitting: Emergency Medicine

## 2021-09-14 ENCOUNTER — Encounter (HOSPITAL_COMMUNITY): Payer: Self-pay

## 2021-09-14 DIAGNOSIS — H9191 Unspecified hearing loss, right ear: Secondary | ICD-10-CM | POA: Diagnosis not present

## 2021-09-14 DIAGNOSIS — Z85818 Personal history of malignant neoplasm of other sites of lip, oral cavity, and pharynx: Secondary | ICD-10-CM | POA: Diagnosis not present

## 2021-09-14 DIAGNOSIS — I10 Essential (primary) hypertension: Secondary | ICD-10-CM | POA: Diagnosis not present

## 2021-09-14 DIAGNOSIS — Z85828 Personal history of other malignant neoplasm of skin: Secondary | ICD-10-CM | POA: Diagnosis not present

## 2021-09-14 DIAGNOSIS — Z79899 Other long term (current) drug therapy: Secondary | ICD-10-CM | POA: Insufficient documentation

## 2021-09-14 NOTE — ED Triage Notes (Addendum)
Pt woke up this morning unable to ear out of right ear. Pt thought his hearing aid was out of battery but found out it was not the hearing aid. Per wife the ear MD told them it could be an infection going on and they needed to come to the ER. Hx of CA. BP is 209/128 in triage

## 2021-09-15 DIAGNOSIS — H9191 Unspecified hearing loss, right ear: Secondary | ICD-10-CM | POA: Diagnosis not present

## 2021-09-15 MED ORDER — PREDNISONE 20 MG PO TABS
60.0000 mg | ORAL_TABLET | Freq: Once | ORAL | Status: AC
  Administered 2021-09-15: 60 mg via ORAL
  Filled 2021-09-15: qty 3

## 2021-09-15 MED ORDER — PREDNISONE 20 MG PO TABS: 60.0000 mg | ORAL_TABLET | Freq: Every day | ORAL | 0 refills | Status: AC

## 2021-09-15 NOTE — ED Provider Notes (Signed)
[facility] DEPT Provider Note   CSN: [id number] Arrival date & time: 09/14/21  1612     History Chief Complaint  Patient presents with   Hearing Loss    Lee Barnes is a 70 y.o. male.  The history is provided by the patient, the spouse and medical records.  Lee Barnes is a 70 y.o. male who presents to the Emergency Department complaining of hearing loss. He presents the emergency department accompanied by his wife for evaluation of acute onset hearing loss that occurred when he woke up this morning. 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 He is in remission from this. 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 He has been in his routine state of health recently. He does use hearing aids bilaterally. When he could not hear this morning he went to the audiologist, who checked his hearing aids and noted that they were working properly. No reports of recent illnesses. No fevers, nausea, vomiting, vision changes, numbness, weakness, headaches. He contacted his current ENT physician, but they retired today. He is arranged to follow-up with Dr. Blenda Nicely on November 8 but has not established care yet. The follow-up is regarding his chronic dysphasia and speech difficulties.    Past Medical History:  Diagnosis Date   Allergy to environmental factors    Complication of anesthesia    "vageled" after surgery -overnite stay   Diverticulosis 2008   Heart murmur    hx of in childhood    History of chemotherapy    History of radiation therapy 11/05/10-12/26/10   r base tongue, 7000 cGy 35 sessions   Hypertension    Medication started in May 2016.   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/25/2015   Essential hypertension 03/21/2015   Allergic rhinitis 12/14/2013   ED (erectile dysfunction) 12/14/2013   Diverticulosis    History of oropharyngeal cancer 09/18/2010    Past Surgical History:  Procedure Laterality Date   APPENDECTOMY     COLONOSCOPY     INGUINAL HERNIA REPAIR Bilateral 03/30/2015   Procedure: LAPAROSCOPIC BILATERAL INGUINAL HERNIA REPAIR WITH MESH;  Surgeon: Coralie Keens, MD;  [facility];  Service: General;  Laterality: Bilateral;   INGUINAL HERNIA REPAIR Right [place] Bilateral 03/30/2015   INGUINAL HERNIA REPAIR Left 07/11/2015   INGUINAL HERNIA REPAIR N/A 07/11/2015   Procedure: REPAIR OF LEFT INGUINAL HERNIA WITH MESH;  Surgeon: Coralie Keens, MD;  Location: [facility];  Service: General;  Laterality: N/A;   INSERTION OF MESH N/A 07/11/2015   Procedure: INSERTION OF MESH;  Surgeon: Coralie Keens, MD;  Location: [facility];  Service: General;  Laterality: N/A;   LUMBAR DISC SURGERY     TONSILLECTOMY         Family History  Problem Relation Age of Onset   Heart disease Father    Cancer Father        throat ca   Heart disease Mother    Colon cancer Neg Hx  Social History   Tobacco Use   Smoking status: Never   Smokeless tobacco: Never  Substance Use Topics   Alcohol use: Yes    Comment: occas drinks a beer every few weeks    Drug use: No    Comment:      Home Medications Prior to Admission medications   Medication Sig Start Date End Date Taking? Authorizing Provider  predniSONE (DELTASONE) 20 MG tablet Take 3 tablets (60 mg total) by mouth daily for 9 days. 09/15/21 09/24/21 Yes Quintella Reichert, MD  acetaminophen (TYLENOL) 500 MG tablet Take 500 mg by mouth every 6 (six) hours as needed for mild pain or moderate  pain. Patient not taking: No sig reported    [provider]  amLODipine (NORVASC) 5 MG tablet Take 1 tablet (5 mg total) by mouth every evening. 02/28/21   Denita Lung, MD  cetirizine (ZYRTEC) 10 MG chewable tablet Chew 10 mg by mouth at bedtime. Patient not taking: No sig reported    [provider]  fluticasone (FLONASE) 50 MCG/ACT nasal spray INSTILL TWO SPRAYS INTO EACH NOSTRIL DAILY AS NEEDED 02/28/21   Denita Lung, MD  triamcinolone cream (KENALOG) 0.1 %  10/23/20   [provider]    Allergies    Codeine  Review of Systems   Review of Systems  All other systems reviewed and are negative.  Physical Exam Updated Vital Signs BP (!) 143/102   Pulse 81   Temp 98 F (36.7 C) (Oral)   Resp 16   Ht 6' (1.829 m)   Wt 73.9 kg   SpO2 94%   BMI 22.11 kg/m   Physical Exam Vitals and nursing note reviewed.  Constitutional:      Appearance: He is well-developed.  HENT:     Head: Normocephalic and atraumatic.     Comments: TMs dull and flat bilaterally without any erythema. There is no significant erythema or edema of the ear canals bilaterally. There is no mastoid tenderness. There is a chronic appearing deformity to the right mandible. Eyes:     Extraocular Movements: Extraocular movements intact.     Pupils: Pupils are equal, round, and reactive to light.  Cardiovascular:     Rate and Rhythm: Normal rate and regular rhythm.     Heart sounds: No murmur heard. Pulmonary:     Effort: Pulmonary effort is normal. No respiratory distress.     Breath sounds: Normal breath sounds.  Abdominal:     Palpations: Abdomen is soft.     Tenderness: There is no abdominal tenderness. There is no guarding or rebound.  Musculoskeletal:        General: No tenderness.  Skin:    General: Skin is warm and dry.  Neurological:     Mental Status: He is alert and oriented to person, place, and time.     Comments: No asymmetry of facial movements. Visual fields  grossly intact. Absent hearing in the right ear. Unable to perform webber test due to patient being unable to hear on either side. With Rinne testing he is only able to hear on bone conduction on the left. Five out of five strength in all four extremities with sensation to light touch intact in all four extremities. Normal gait.  Psychiatric:        Behavior: Behavior normal.    ED Results / Procedures / Treatments   Labs (all labs ordered are listed, but only abnormal results are displayed) Labs Reviewed - No data  to display  EKG None  Radiology No results found.  Procedures Procedures   Medications Ordered in ED Medications  predniSONE (DELTASONE) tablet 60 mg (has no administration in time range)    ED Course  I have reviewed the triage vital signs and the nursing notes.  Pertinent labs & imaging results that were available during my care of the patient were reviewed by me and considered in my medical decision making (see chart for details).    MDM Rules/Calculators/A&P                          patient here for evaluation of acute right-sided hearing loss. Aside from hearing loss he is neurologically intact. Current evaluation is not consistent with CVA. No evidence of acute infectious source. Plan to discharge with close ENT follow-up as well as return precautions.  Final Clinical Impression(s) / ED Diagnoses Final diagnoses:  Acute hearing loss of right ear    Rx / DC Orders ED Discharge Orders          Ordered    predniSONE (DELTASONE) 20 MG tablet  Daily        09/15/21 0206             Quintella Reichert, MD 09/15/21 0211

## 2021-09-18 DIAGNOSIS — R1312 Dysphagia, oropharyngeal phase: Secondary | ICD-10-CM | POA: Diagnosis not present

## 2021-09-18 DIAGNOSIS — Z85810 Personal history of malignant neoplasm of tongue: Secondary | ICD-10-CM | POA: Diagnosis not present

## 2021-09-18 DIAGNOSIS — J3801 Paralysis of vocal cords and larynx, unilateral: Secondary | ICD-10-CM | POA: Diagnosis not present

## 2021-09-18 DIAGNOSIS — J383 Other diseases of vocal cords: Secondary | ICD-10-CM | POA: Diagnosis not present

## 2021-09-18 DIAGNOSIS — Y842 Radiological procedure and radiotherapy as the cause of abnormal reaction of the patient, or of later complication, without mention of misadventure at the time of the procedure: Secondary | ICD-10-CM | POA: Diagnosis not present

## 2021-09-18 DIAGNOSIS — H903 Sensorineural hearing loss, bilateral: Secondary | ICD-10-CM | POA: Diagnosis not present

## 2021-09-18 DIAGNOSIS — H9121 Sudden idiopathic hearing loss, right ear: Secondary | ICD-10-CM | POA: Diagnosis not present

## 2021-09-18 DIAGNOSIS — R030 Elevated blood-pressure reading, without diagnosis of hypertension: Secondary | ICD-10-CM | POA: Diagnosis not present

## 2021-09-18 DIAGNOSIS — Z8739 Personal history of other diseases of the musculoskeletal system and connective tissue: Secondary | ICD-10-CM | POA: Diagnosis not present

## 2021-09-18 DIAGNOSIS — Z9221 Personal history of antineoplastic chemotherapy: Secondary | ICD-10-CM | POA: Diagnosis not present

## 2021-09-18 DIAGNOSIS — Z923 Personal history of irradiation: Secondary | ICD-10-CM | POA: Diagnosis not present

## 2021-09-19 ENCOUNTER — Ambulatory Visit: Payer: Medicare Other | Attending: Family Medicine

## 2021-09-19 ENCOUNTER — Other Ambulatory Visit: Payer: Self-pay

## 2021-09-19 DIAGNOSIS — R490 Dysphonia: Secondary | ICD-10-CM

## 2021-09-19 DIAGNOSIS — Z85810 Personal history of malignant neoplasm of tongue: Secondary | ICD-10-CM | POA: Diagnosis not present

## 2021-09-19 DIAGNOSIS — R471 Dysarthria and anarthria: Secondary | ICD-10-CM

## 2021-09-19 DIAGNOSIS — J3801 Paralysis of vocal cords and larynx, unilateral: Secondary | ICD-10-CM | POA: Diagnosis not present

## 2021-09-19 DIAGNOSIS — C029 Malignant neoplasm of tongue, unspecified: Secondary | ICD-10-CM | POA: Diagnosis not present

## 2021-09-19 DIAGNOSIS — L929 Granulomatous disorder of the skin and subcutaneous tissue, unspecified: Secondary | ICD-10-CM | POA: Diagnosis not present

## 2021-09-19 DIAGNOSIS — R1312 Dysphagia, oropharyngeal phase: Secondary | ICD-10-CM | POA: Diagnosis not present

## 2021-09-19 DIAGNOSIS — J3800 Paralysis of vocal cords and larynx, unspecified: Secondary | ICD-10-CM | POA: Diagnosis not present

## 2021-09-19 DIAGNOSIS — T17908A Unspecified foreign body in respiratory tract, part unspecified causing other injury, initial encounter: Secondary | ICD-10-CM | POA: Diagnosis not present

## 2021-09-19 DIAGNOSIS — I6523 Occlusion and stenosis of bilateral carotid arteries: Secondary | ICD-10-CM | POA: Diagnosis not present

## 2021-09-20 NOTE — Therapy (Signed)
[facility] [address] [place], [place], [address] Phone: [phone]   Fax:  [phone]  Speech Language Pathology Treatment  Patient Details  Name: Lee Barnes MRN: [id number] Date of Birth: 31-Jul-1951 Referring Provider (SLP): Dr. Sharlot Gowda   Encounter Date: 09/19/2021   End of Session - 09/20/21 1215     Visit Number 23    Number of Visits 27    Date for SLP Re-Evaluation 11/20/21    Authorization Type none    SLP Start Time 1021    SLP Stop Time  1100    SLP Time Calculation (min) 39 min    Activity Tolerance Patient tolerated treatment well             Past Medical History:  Diagnosis Date   Allergy to environmental factors    Complication of anesthesia    "vageled" after surgery -overnite stay   Diverticulosis 2008   Heart murmur    hx of in childhood    History of chemotherapy    History of radiation therapy 11/05/10-12/26/10   r base tongue, 7000 cGy 35 sessions   Hypertension    Medication started in May 2016.   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   Tinnitus     Past Surgical History:  Procedure Laterality Date   APPENDECTOMY     COLONOSCOPY     INGUINAL HERNIA REPAIR Bilateral 03/30/2015   Procedure: LAPAROSCOPIC BILATERAL INGUINAL HERNIA REPAIR WITH MESH;  Surgeon: Abigail Miyamoto, MD;  Location: [facility];  Service: General;  Laterality: Bilateral;   INGUINAL HERNIA REPAIR Right 1960   INGUINAL HERNIA REPAIR Bilateral 03/30/2015   INGUINAL HERNIA REPAIR Left 07/11/2015   INGUINAL HERNIA REPAIR N/A 07/11/2015   Procedure: REPAIR OF LEFT INGUINAL HERNIA WITH MESH;  Surgeon: Abigail Miyamoto, MD;  [facility];  Service: General;  Laterality: N/A;   INSERTION OF MESH N/A 07/11/2015   Procedure: INSERTION OF MESH;  Surgeon: Abigail Miyamoto, MD;  Location[facility];  Service: General;  Laterality: N/A;   LUMBAR DISC  SURGERY     TONSILLECTOMY      There were no vitals filed for this visit.   Subjective Assessment - 09/19/21 1036     Subjective Pt's voice sounds different today than last visit. More aphonic than previous appointment. Saw Mar[facility]ino yesterday and referred for MRI.    Currently in Pain? No/denies                   ADULT SLP TREATMENT - 09/19/21 1041       General Information   Behavior/Cognition Alert;Cooperative;Pleasant mood      Treatment Provided   Treatment provided Dysphagia      Dysphagia Treatment   Temperature Spikes Noted No    Respiratory Status Room air    Oral Cavity - Dentition Adequate natural dentition    Treatment Methods Skilled observation;Patient/caregiver education    Patient observed directly with PO's No    Pharyngeal Phase Signs & Symptoms Other (comment)   pt remained with hydrophonic voice during the entire session   Other treatment/comments Pt degree of hydrophonic voice is greater than previous session/s. 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 Pt recommended to have MRI - scheduled for today. SLP encouraged pt to cont with  HEP for swallowing until this acute situation is resolved. SLP explained to wife and reminded pt that until next ST appointmten pt: 1) should have 16-20 oz H2O daily after strict oral care. If 30 minutes passes without any water pt must perform oral care again. 2) perform HEP including EMST and IMST at least once/day and at best, BID.      Assessment / Recommendations / Plan   Plan Continue with current plan of care   see pt in two weeks if this acute situation is dx'd; If not, pt will cx               SLP Short Term Goals - 07/09/21 1145       SLP SHORT TERM GOAL #1   Title Pt will follow swallow precautions with rare min A over 2 sessions    Time 1    Period Weeks    Status Not Met      SLP SHORT TERM GOAL #2   Title Pt will  complete HEP for dysphagia with rare min A over 2 sessions    Baseline 04/02/21    Time 1    Period Weeks    Status Partially Met      SLP SHORT TERM GOAL #3   Title Pt will complete RMT HEP of 3 sets of 25 repetitions of IMT and EMT at 60-70% of MIP and MEP 5/7 days for 4 weeks    Time 1    Period Weeks    Status Not Met              SLP Long Term Goals - 09/20/21 1213       SLP LONG TERM GOAL #1   Title Pt will follow swallow precautions with mod I over 2 sessions starting 08-22-21    Time 12    Status On-going    Target Date 11/20/21      SLP LONG TERM GOAL #2   Title Pt will complete HEP for dysphagia with mod I over 2 sessions    Baseline 05/02/21, 05/07/21    Status Achieved      SLP LONG TERM GOAL #3   Title Pt will demonstrate improved airway protection, glottal closure by not contracting aspiration pna over 12 weeks.    Time 12    Period Weeks    Status On-going   from "not met"   Target Date 11/20/21      SLP LONG TERM GOAL #4   Title Pt will complete HEP for dysphagia with mod I over 3 sessions, beginning 08-22-21    Time 12    Period Weeks    Status On-going   and continue   Target Date 11/20/21      SLP LONG TERM GOAL #5   Title pt will complete 25 adequate reps of EMST with at least 55 cm H2O and of IMST with at least 25 cm H2O (target at least 60 for EMST and at least 28 for IMST) in 3 sessions    Time 12    Period Weeks    Status On-going   and ongoing   Target Date 11/20/21              Plan - 09/20/21 1215     Clinical Impression Statement [name] reports he has been inconsistent since last session with EMST and IMST as well as swallow HEP "All this other stuff going on." See daily note for more details. No overt s/sx aspiration  PNA today. Pt needs to have something PO for muscle memory and mitigate muscle atrophy so SLP stressed WATER ONLY (after thorough oral care <30 minutes prior), given new dx of rt paralyzed vocal fold. Most recent MBSS  cleared pt for thin liquids with advancement as clinicially indicated under guidance of SLP. Continued skilled ST remains necessary to improve airway protection and reduce risk of aspiration pna.    Speech Therapy Frequency --   every other week   Duration --   90 days - 27 total visits   Treatment/Interventions Aspiration precaution training;Pharyngeal strengthening exercises;Diet toleration management by SLP;Trials of upgraded texture/liquids;Internal/external aids;Patient/family education;Compensatory strategies;Oral motor exercises;Environmental controls;SLP instruction and feedback;Other (comment)    Potential to Achieve Goals Fair    Potential Considerations Co-morbidities;Previous level of function;Severity of impairments    Consulted and Agree with Plan of Care Patient             Patient will benefit from skilled therapeutic intervention in order to improve the following deficits and impairments:   Dysphagia, oropharyngeal phase  Voice hoarseness  Dysarthria and anarthria    Problem List Patient Active Problem List   Diagnosis Date Noted   Chronic hyponatremia 02/20/2021   Dysphagia 12/12/2020   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, [address] [place], [place], [address] Phone: [phone]   Fax:  [phone]   Name: MARKIS HOWERY MRN: [id number] Date of Birth: [birth year]

## 2021-09-25 ENCOUNTER — Other Ambulatory Visit: Payer: Self-pay

## 2021-09-25 ENCOUNTER — Ambulatory Visit (INDEPENDENT_AMBULATORY_CARE_PROVIDER_SITE_OTHER): Payer: Medicare Other | Admitting: Family Medicine

## 2021-09-25 ENCOUNTER — Encounter: Payer: Self-pay | Admitting: Family Medicine

## 2021-09-25 VITALS — BP 180/100 | HR 95 | Temp 98.3°F | Wt 163.6 lb

## 2021-09-25 DIAGNOSIS — E871 Hypo-osmolality and hyponatremia: Secondary | ICD-10-CM | POA: Diagnosis not present

## 2021-09-25 DIAGNOSIS — J3801 Paralysis of vocal cords and larynx, unilateral: Secondary | ICD-10-CM | POA: Diagnosis not present

## 2021-09-25 DIAGNOSIS — Z961 Presence of intraocular lens: Secondary | ICD-10-CM | POA: Diagnosis not present

## 2021-09-25 DIAGNOSIS — Z23 Encounter for immunization: Secondary | ICD-10-CM | POA: Diagnosis not present

## 2021-09-25 DIAGNOSIS — H9121 Sudden idiopathic hearing loss, right ear: Secondary | ICD-10-CM | POA: Diagnosis not present

## 2021-09-25 NOTE — Progress Notes (Signed)
Subjective:    Patient ID: Lee Barnes, male    DOB: Feb 14, 1951, 70 y.o.   MRN: [id number]  HPI He is here for follow-up on his blood work concerning history of hyponatremia.  Also he was recently evaluated and treated for the acute onset of right-sided hearing loss.  He was given steroids for this and finished them yesterday but has not seen any change in his hearing ability.  Also during the same timeframe he had developed difficulty with his voice and was subsequently found out to have vocal cord paralysis.  He has seen ENT for this.  Medical record was reviewed for both of this.   Review of Systems     Objective:   Physical Exam Alert and in no distress.  Voice is definitely more gravelly.       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  He will follow-up with ENT concerning the vocal cord issues.

## 2021-09-26 LAB — COMPREHENSIVE METABOLIC PANEL
ALT: 28 IU/L (ref 0–44)
AST: 20 IU/L (ref 0–40)
Albumin/Globulin Ratio: 1.4 (ref 1.2–2.2)
Albumin: 4 g/dL (ref 3.8–4.8)
Alkaline Phosphatase: 108 IU/L (ref 44–121)
BUN/Creatinine Ratio: 32 — ABNORMAL HIGH (ref 10–24)
BUN: 29 mg/dL — ABNORMAL HIGH (ref 8–27)
Bilirubin Total: 0.4 mg/dL (ref 0.0–1.2)
CO2: 27 mmol/L (ref 20–29)
Calcium: 9.1 mg/dL (ref 8.6–10.2)
Chloride: 92 mmol/L — ABNORMAL LOW (ref 96–106)
Creatinine, Ser: 0.91 mg/dL (ref 0.76–1.27)
Globulin, Total: 2.9 g/dL (ref 1.5–4.5)
Glucose: 93 mg/dL (ref 70–99)
Potassium: 4.9 mmol/L (ref 3.5–5.2)
Sodium: 130 mmol/L — ABNORMAL LOW (ref 134–144)
Total Protein: 6.9 g/dL (ref 6.0–8.5)
eGFR: 91 mL/min/{1.73_m2} (ref >59)

## 2021-09-29 DIAGNOSIS — I614 Nontraumatic intracerebral hemorrhage in cerebellum: Secondary | ICD-10-CM | POA: Diagnosis not present

## 2021-09-29 DIAGNOSIS — H9190 Unspecified hearing loss, unspecified ear: Secondary | ICD-10-CM | POA: Diagnosis not present

## 2021-09-29 DIAGNOSIS — H905 Unspecified sensorineural hearing loss: Secondary | ICD-10-CM | POA: Diagnosis not present

## 2021-10-02 DIAGNOSIS — H9121 Sudden idiopathic hearing loss, right ear: Secondary | ICD-10-CM | POA: Diagnosis not present

## 2021-10-02 DIAGNOSIS — J3801 Paralysis of vocal cords and larynx, unilateral: Secondary | ICD-10-CM | POA: Diagnosis not present

## 2021-10-02 DIAGNOSIS — Z931 Gastrostomy status: Secondary | ICD-10-CM | POA: Diagnosis not present

## 2021-10-02 DIAGNOSIS — Z974 Presence of external hearing-aid: Secondary | ICD-10-CM | POA: Diagnosis not present

## 2021-10-02 DIAGNOSIS — Z9221 Personal history of antineoplastic chemotherapy: Secondary | ICD-10-CM | POA: Diagnosis not present

## 2021-10-02 DIAGNOSIS — Z85810 Personal history of malignant neoplasm of tongue: Secondary | ICD-10-CM | POA: Diagnosis not present

## 2021-10-02 DIAGNOSIS — Z923 Personal history of irradiation: Secondary | ICD-10-CM | POA: Diagnosis not present

## 2021-10-02 DIAGNOSIS — J386 Stenosis of larynx: Secondary | ICD-10-CM | POA: Diagnosis not present

## 2021-10-02 DIAGNOSIS — R490 Dysphonia: Secondary | ICD-10-CM | POA: Diagnosis not present

## 2021-10-03 ENCOUNTER — Other Ambulatory Visit: Payer: Self-pay

## 2021-10-03 ENCOUNTER — Ambulatory Visit: Payer: Medicare Other

## 2021-10-03 DIAGNOSIS — R471 Dysarthria and anarthria: Secondary | ICD-10-CM

## 2021-10-03 DIAGNOSIS — R490 Dysphonia: Secondary | ICD-10-CM | POA: Diagnosis not present

## 2021-10-03 DIAGNOSIS — R1312 Dysphagia, oropharyngeal phase: Secondary | ICD-10-CM | POA: Diagnosis not present

## 2021-10-03 NOTE — Therapy (Signed)
at least 28 for IMST) in 3 sessions    Time 12    Period Weeks    Status On-going   and ongoing             Plan - 10/03/21 1606     Clinical Impression  Statement Lee Barnes reports he has been inconsistent since last session with EMST and IMST as well as swallow HEP, pt was at a level that SLP believes his strength regained would be reached at this time. See daily note for more details. No overt s/sx aspiration PNA today. Pt needs to have something PO for muscle memory and mitigate muscle atrophy so SLP stressed WATER ONLY (after thorough oral care <30 minutes prior), Continued skilled ST remains necessary to educate pt how to maximize swallowing potential, and to cont to maintain pulmonary health/reduce risk of aspiration pna.    Speech Therapy Frequency --   every other week   Duration --   90 days - 27 total visits   Treatment/Interventions Aspiration precaution training;Pharyngeal strengthening exercises;Diet toleration management by SLP;Trials of upgraded texture/liquids;Internal/external aids;Patient/family education;Compensatory strategies;Oral motor exercises;Environmental controls;SLP instruction and feedback;Other (comment)    Potential to Achieve Goals Fair    Potential Considerations Co-morbidities;Previous level of function;Severity of impairments    Consulted and Agree with Plan of Care Patient             Patient will benefit from skilled therapeutic intervention in order to improve the following deficits and impairments:   Dysphagia, oropharyngeal phase  Voice hoarseness  Dysarthria and anarthria    Problem List Patient Active Problem List   Diagnosis Date Noted   Chronic hyponatremia 02/20/2021   Dysphagia 12/12/2020   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   ACE-inhibitor cough 05/25/2015   Essential hypertension 03/21/2015   Allergic rhinitis 12/14/2013   ED (erectile dysfunction) 12/14/2013   Diverticulosis    History of oropharyngeal cancer 09/18/2010    [facility] ,MS, CCC-SLP  10/03/2021, 4:09 PM  [facility] [address], [place], [place], [address] Phone: [phone]   Fax:  [phone]   Name: Lee Barnes MRN: [id number] Date of Birth: 03/18/1951  [facility] [address] [place], [place], [address] Phone: [phone]   Fax:  [phone]  Speech Language Pathology Treatment  Patient Details  Name: Lee Barnes MRN: [id number] Date of Birth: Jul 24, 1951 Referring Provider (SLP): Dr. Jill Alexanders   Encounter Date: 10/03/2021   End of Session - 10/03/21 1320     Visit Number 24    Number of Visits 27    Date for SLP Re-Evaluation 11/20/21    Authorization Type none    SLP Start Time 1020    SLP Stop Time  1100    SLP Time Calculation (min) 40 min    Activity Tolerance Patient tolerated treatment well             Past Medical History:  Diagnosis Date   Allergy to environmental factors    Complication of anesthesia    "vageled" after surgery -overnite stay   Diverticulosis 2008   Heart murmur    hx of in childhood    History of chemotherapy    History of radiation therapy 11/05/10-12/26/10   r base tongue, 7000 cGy 35 sessions   Hypertension    Medication started in May 2016.   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   Tinnitus     Past Surgical History:  Procedure Laterality Date   APPENDECTOMY     COLONOSCOPY     INGUINAL HERNIA REPAIR Bilateral 03/30/2015   Procedure: LAPAROSCOPIC BILATERAL INGUINAL HERNIA REPAIR WITH MESH;  Surgeon: Coralie Keens, MD;  Location: [facility];  Service: General;  Laterality: Bilateral;   INGUINAL HERNIA REPAIR Right [place] Bilateral 03/30/2015   INGUINAL HERNIA REPAIR Left 07/11/2015   INGUINAL HERNIA REPAIR N/A 07/11/2015   Procedure: REPAIR OF LEFT INGUINAL HERNIA WITH MESH;  Surgeon: Coralie Keens, MD;  Location: [facility];  Service: General;  Laterality: N/A;   INSERTION OF MESH N/A 07/11/2015   Procedure: INSERTION OF MESH;  Surgeon: Coralie Keens, MD;  Location: [facility];  Service: General;  Laterality: N/A;   LUMBAR DISC  SURGERY     TONSILLECTOMY      There were no vitals filed for this visit.   Subjective Assessment - 10/03/21 1305     Subjective "She said there wasn't really anything to do for his voice." (wife)    Currently in Pain? No/denies                   ADULT SLP TREATMENT - 10/03/21 1306       General Information   Behavior/Cognition Alert;Cooperative;Pleasant mood      Treatment Provided   Treatment provided Dysphagia      Dysphagia Treatment   Temperature Spikes Noted No    Respiratory Status Room air    Oral Cavity - Dentition Adequate natural dentition    Treatment Methods Skilled observation;Patient/caregiver education    Patient observed directly with PO's Yes    Type of PO's observed Thin liquids    Liquids provided via Cup    Pharyngeal Phase Signs & Symptoms Wet vocal quality   VERY mild, x2/8   Other treatment/comments Pt performed oral care prior to arriving at clinic. Lee Barnes followed aspiration precautions with water - has been having at least 8 oz/day and is taking 15-20 minutes to complete. No overt s/sx aspiration PNA today in session, nor any  at least 28 for IMST) in 3 sessions    Time 12    Period Weeks    Status On-going   and ongoing             Plan - 10/03/21 1606     Clinical Impression  Statement Lee Barnes reports he has been inconsistent since last session with EMST and IMST as well as swallow HEP, pt was at a level that SLP believes his strength regained would be reached at this time. See daily note for more details. No overt s/sx aspiration PNA today. Pt needs to have something PO for muscle memory and mitigate muscle atrophy so SLP stressed WATER ONLY (after thorough oral care <30 minutes prior), Continued skilled ST remains necessary to educate pt how to maximize swallowing potential, and to cont to maintain pulmonary health/reduce risk of aspiration pna.    Speech Therapy Frequency --   every other week   Duration --   90 days - 27 total visits   Treatment/Interventions Aspiration precaution training;Pharyngeal strengthening exercises;Diet toleration management by SLP;Trials of upgraded texture/liquids;Internal/external aids;Patient/family education;Compensatory strategies;Oral motor exercises;Environmental controls;SLP instruction and feedback;Other (comment)    Potential to Achieve Goals Fair    Potential Considerations Co-morbidities;Previous level of function;Severity of impairments    Consulted and Agree with Plan of Care Patient             Patient will benefit from skilled therapeutic intervention in order to improve the following deficits and impairments:   Dysphagia, oropharyngeal phase  Voice hoarseness  Dysarthria and anarthria    Problem List Patient Active Problem List   Diagnosis Date Noted   Chronic hyponatremia 02/20/2021   Dysphagia 12/12/2020   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   ACE-inhibitor cough 05/25/2015   Essential hypertension 03/21/2015   Allergic rhinitis 12/14/2013   ED (erectile dysfunction) 12/14/2013   Diverticulosis    History of oropharyngeal cancer 09/18/2010    [facility] ,MS, CCC-SLP  10/03/2021, 4:09 PM  [facility] [address], [place], [place], [address] Phone: [phone]   Fax:  [phone]   Name: Lee Barnes MRN: [id number] Date of Birth: 03/18/1951

## 2021-10-03 NOTE — Patient Instructions (Addendum)
   Keep having water, at least 12 oz, at least once/day, at least 5 days/week. Avoid doing the water when you know you are fatigued.  Do the full scope of the exercises, at least 3 times a week.  When they call you for the swallow test, schedule it for 10-29-21 or 10-30-21. If you can get with Hor[facility] Latino, do that. I will see you again on 10-31-21 or 11-02-21. If they recommend a certain diet, bring some food from home so I can watch you with it.

## 2021-10-08 DIAGNOSIS — H9121 Sudden idiopathic hearing loss, right ear: Secondary | ICD-10-CM | POA: Diagnosis not present

## 2021-10-09 ENCOUNTER — Telehealth: Payer: Self-pay | Admitting: Family Medicine

## 2021-10-09 NOTE — Chronic Care Management (AMB) (Signed)
Chronic Care Management   Outreach Note  10/09/2021 Name: Lee Barnes MRN: [id number] DOB: May 13, 1951  Referred by: Ronnald Nian, MD Reason for referral : No chief complaint on file.   An unsuccessful telephone outreach was attempted today. The patient was referred to the pharmacist for assistance with care management and care coordination.   Follow Up Plan:   Tatjana Dellinger Upstream Scheduler

## 2021-10-10 ENCOUNTER — Other Ambulatory Visit (HOSPITAL_COMMUNITY): Payer: Self-pay

## 2021-10-10 ENCOUNTER — Other Ambulatory Visit: Payer: Self-pay | Admitting: Family Medicine

## 2021-10-10 DIAGNOSIS — Z8701 Personal history of pneumonia (recurrent): Secondary | ICD-10-CM

## 2021-10-10 DIAGNOSIS — R059 Cough, unspecified: Secondary | ICD-10-CM

## 2021-10-10 DIAGNOSIS — R1310 Dysphagia, unspecified: Secondary | ICD-10-CM

## 2021-10-10 DIAGNOSIS — Z85819 Personal history of malignant neoplasm of unspecified site of lip, oral cavity, and pharynx: Secondary | ICD-10-CM

## 2021-10-10 NOTE — Progress Notes (Signed)
[id number]

## 2021-10-15 ENCOUNTER — Telehealth: Payer: Self-pay | Admitting: Family Medicine

## 2021-10-15 DIAGNOSIS — H9121 Sudden idiopathic hearing loss, right ear: Secondary | ICD-10-CM | POA: Diagnosis not present

## 2021-10-15 NOTE — Chronic Care Management (AMB) (Signed)
Chronic Care Management   Note  10/15/2021 Name: Lee Barnes MRN: [id number] DOB: Feb 23, 1951  Lee Barnes is a 70 y.o. year old male who is a primary care patient of Susann Givens, Everardo All, MD. I reached out to [name] by phone today in response to a referral sent by [name]'s PCP, Ronnald Nian, MD.   Lee Barnes was given information about Chronic Care Management services today including:  CCM service includes personalized support from designated clinical staff supervised by his physician, including individualized plan of care and coordination with other care providers 24/7 contact phone numbers for assistance for urgent and routine care needs. Service will only be billed when office clinical staff spend 20 minutes or more in a month to coordinate care. Only one practitioner may furnish and bill the service in a calendar month. The patient may stop CCM services at any time (effective at the end of the month) by phone call to the office staff.   Patient agreed to services and verbal consent obtained.   Follow up plan:   Tatjana Restaurant manager, fast food

## 2021-10-23 DIAGNOSIS — H9121 Sudden idiopathic hearing loss, right ear: Secondary | ICD-10-CM | POA: Diagnosis not present

## 2021-10-29 ENCOUNTER — Ambulatory Visit (HOSPITAL_COMMUNITY)
Admission: RE | Admit: 2021-10-29 | Discharge: 2021-10-29 | Disposition: A | Discharge status: Home / Self Care | Payer: Medicare Other | Source: Ambulatory Visit | Attending: Family Medicine | Admitting: Family Medicine

## 2021-10-29 ENCOUNTER — Other Ambulatory Visit: Payer: Self-pay

## 2021-10-29 ENCOUNTER — Other Ambulatory Visit: Payer: Self-pay | Admitting: Family Medicine

## 2021-10-29 DIAGNOSIS — Z8701 Personal history of pneumonia (recurrent): Secondary | ICD-10-CM | POA: Insufficient documentation

## 2021-10-29 DIAGNOSIS — Z85819 Personal history of malignant neoplasm of unspecified site of lip, oral cavity, and pharynx: Secondary | ICD-10-CM | POA: Insufficient documentation

## 2021-10-29 DIAGNOSIS — R059 Cough, unspecified: Secondary | ICD-10-CM

## 2021-10-29 DIAGNOSIS — J3089 Other allergic rhinitis: Secondary | ICD-10-CM

## 2021-10-29 DIAGNOSIS — R1312 Dysphagia, oropharyngeal phase: Secondary | ICD-10-CM

## 2021-10-29 DIAGNOSIS — R1310 Dysphagia, unspecified: Secondary | ICD-10-CM

## 2021-10-29 NOTE — Therapy (Signed)
Modified Barium Swallow Progress Note  Patient Details  Name: Lee Barnes MRN: [id number] Date of Birth: August 02, 1951  Today's Date: 10/29/2021  Modified Barium Swallow completed.  Full report located under Chart Review in the Imaging Section.  Brief recommendations include the following:  Clinical Impression 70yo male referred for outpatient MBS to assess appropriateness for PO intake following intensive outpatient dysphagia therapy. Pt's wife was present during this examination.  Pt presents with continued oropharyngeal dysphagia, as seen on September 2022 MBS. Some improvement is noted in oral prep and clearing this date. Premature spillage to the level of the vallecula (puree, solids) or pyriform sinus (thin, nectar thick liquids) is noted. Extended oral prep was noted on solid/puree combination.   Pharyngeal swallow continues to be characterized by delayed swallow reflex, poor epiglottic inversion, and reduced hyolaryngeal elevation. This results in penetration duing the swallow across all consistencies tested (thin liquid, nectar thick liquid, puree, and solid). Pt senses penetrate and clears his throat or coughs prior to swallowing again. Aspiration of residue on nectar thick liquid was seen x1 after the initial swallow. Compensatory positions of head turn left, right, and chin tuck were used to minimize aspiration risk. Head turn right resulted in trace aspiration of thin liquids. Chin tuck resulted in penetration to the vocal folds. Head turn left was the most successful position, with penetration minimized. Post-swallow residue increases risk of penetration and aspiration, however, pt appears to sense this as well and demonstrates multiple dry swallows to clear residue.  At this time, pt was encouraged to continue PEG tube feeding for primary nutrition and hydration. Bites of very soft, moist solids and small sips of thin liquids with head turn left are encouraged for oral pleasure.  Adherence to safe swallow precautions is paramount. This includes thorough oral care before and after meals, small bites/sips, throat clear/cough and dry swallows after each bolus. Pt and wife were receptive to education regarding pt's continued high risk for aspiration with any PO intake, however, with strict adherence to safe swallow precautions his risk should be minimized. Safe swallow precautions were reviewed with pt and his wife, and written information was given as well.      Swallow Evaluation Recommendations  SLP Diet Recommendations: Dysphagia 3 (Mech soft) solids;Thin liquid (for oral pleasure)   Liquid Administration via: Straw;Cup   Medication Administration: Via alternative means   Supervision: Patient able to self feed   Compensations: Minimize environmental distractions;Slow rate;Small sips/bites;Clear throat after each swallow;Multiple dry swallows after each bite/sip;Other (Comment)   Postural Changes: Seated upright at 90 degrees   Oral Care Recommendations: Oral care before and after PO;Patient independent with oral care       Janely Gullickson B. Quentin Ore, [facility] [place] Speech Language Pathologist Office: [phone]  Shonna Chock 10/29/2021,1:30 PM

## 2021-10-31 ENCOUNTER — Ambulatory Visit: Payer: Medicare Other | Attending: Family Medicine

## 2021-10-31 ENCOUNTER — Other Ambulatory Visit: Payer: Self-pay

## 2021-10-31 DIAGNOSIS — R490 Dysphonia: Secondary | ICD-10-CM | POA: Diagnosis not present

## 2021-10-31 DIAGNOSIS — R471 Dysarthria and anarthria: Secondary | ICD-10-CM

## 2021-10-31 DIAGNOSIS — R1312 Dysphagia, oropharyngeal phase: Secondary | ICD-10-CM | POA: Diagnosis not present

## 2021-10-31 NOTE — Therapy (Signed)
[facility] [address], [address] [place], [place], [address] Phone: [phone]   Fax:  [phone]  Speech Language Pathology Treatment  Patient Details  Name: Lee Barnes MRN: [id number] Date of Birth: Mar 29, 1951 Referring Provider (SLP): Dr. Sharlot Gowda   Encounter Date: 10/31/2021   End of Session - 10/31/21 1059     Visit Number 25    Number of Visits 27    Date for SLP Re-Evaluation 11/20/21    Authorization Type none    SLP Start Time 1020    SLP Stop Time  1102    SLP Time Calculation (min) 42 min    Activity Tolerance Patient tolerated treatment well             Past Medical History:  Diagnosis Date   Allergy to environmental factors    Complication of anesthesia    "vageled" after surgery -overnite stay   Diverticulosis 2008   Heart murmur    hx of in childhood    History of chemotherapy    History of radiation therapy 11/05/10-12/26/10   r base tongue, 7000 cGy 35 sessions   Hypertension    Medication started in May 2016.   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   Tinnitus     Past Surgical History:  Procedure Laterality Date   APPENDECTOMY     COLONOSCOPY     INGUINAL HERNIA REPAIR Bilateral 03/30/2015   Procedure: LAPAROSCOPIC BILATERAL INGUINAL HERNIA REPAIR WITH MESH;  Surgeon: Abigail Miyamoto, MD;  Location: [facility];  Service: General;  Laterality: Bilateral;   INGUINAL HERNIA REPAIR Right 1960   INGUINAL HERNIA REPAIR Bilateral 03/30/2015   INGUINAL HERNIA REPAIR Left 07/11/2015   INGUINAL HERNIA REPAIR N/A 07/11/2015   Procedure: REPAIR OF LEFT INGUINAL HERNIA WITH MESH;  Surgeon: Abigail Miyamoto, MD;  Location: [facility];  Service: General;  Laterality: N/A;   INSERTION OF MESH N/A 07/11/2015   Procedure: INSERTION OF MESH;  Surgeon: Abigail Miyamoto, MD;  Location: [facility];  Service: General;  Laterality: N/A;   LUMBAR DISC  SURGERY     TONSILLECTOMY      There were no vitals filed for this visit.          ADULT SLP TREATMENT - 10/31/21 1301       General Information   Behavior/Cognition Alert;Cooperative;Pleasant mood      Treatment Provided   Treatment provided Dysphagia      Dysphagia Treatment   Temperature Spikes Noted No    Respiratory Status Room air    Oral Cavity - Dentition Adequate natural dentition    Treatment Methods Patient/caregiver education;Compensation strategy training    Patient observed directly with PO's No    Other treatment/comments SLP, pt, and wife discussed pt's latest MBSS from 10-29-21. [name] told SLP that, for pleasure, he could eat soft moist foods and thin liquids with small bites and sips making sure to cough and clear throat during POs. SLP encouraged pt to cont with water each day right after meticulous oral care in order to mitigate muscle atrophy. Pt stated he had cont to complete HEP each day - SLP told pt to complete at least x3/week, QD. As pt becomes ill, SLP told pt he should stop POs until strength increases again, but encouraged him to cont HEP however possible throughout illness. SLP suggested one additional visit for  pt to consult SLP for situations/questions he has and also so SLP could assess pt's production of HEP and make any changes/suggestions necessary.      Assessment / Recommendations / Plan   Plan --   one final visit in first part of January as fits pt's schedule     Progression Toward Goals   Progression toward goals Progressing toward goals              SLP Education - 10/31/21 1306     Education Details when ill - stop POs until strong again, but cont HEP throughout illness    Person(s) Educated Patient;Spouse    Methods Explanation    Comprehension Verbalized understanding              SLP Short Term Goals - 07/09/21 1145       SLP SHORT TERM GOAL #1   Title Pt will follow swallow precautions with rare min A over 2  sessions    Time 1    Period Weeks    Status Not Met      SLP SHORT TERM GOAL #2   Title Pt will complete HEP for dysphagia with rare min A over 2 sessions    Baseline 04/02/21    Time 1    Period Weeks    Status Partially Met      SLP SHORT TERM GOAL #3   Title Pt will complete RMT HEP of 3 sets of 25 repetitions of IMT and EMT at 60-70% of MIP and MEP 5/7 days for 4 weeks    Time 1    Period Weeks    Status Not Met              SLP Long Term Goals - 10/31/21 1309       SLP LONG TERM GOAL #1   Title Pt will follow swallow precautions with mod I over 2 sessions starting 08-22-21    Baseline 10-03-21    Time 12    Status On-going    Target Date 11/20/21      SLP LONG TERM GOAL #2   Title Pt will complete HEP for dysphagia with mod I over 2 sessions    Baseline 05/02/21, 05/07/21    Status Achieved      SLP LONG TERM GOAL #3   Title Pt will demonstrate improved airway protection, glottal closure by not contracting aspiration pna over 12 weeks.    Baseline 10-03-21    Status Achieved   from "not met"     SLP LONG TERM GOAL #4   Title Pt will complete HEP for dysphagia with mod I over 3 sessions, beginning 08-22-21    Time 12    Period Weeks    Status On-going   and continue   Target Date 11/20/21      SLP LONG TERM GOAL #5   Title pt will complete 25 adequate reps of EMST with at least 55 cm H2O and of IMST with at least 25 cm H2O (target at least 60 for EMST and at least 28 for IMST) in 3 sessions    Time 12    Period Weeks    Status On-going   and ongoing   Target Date 11/20/21              Plan - 10/31/21 1307     Clinical Impression Statement [name] reports he has been more consistent since last session with EMST and IMST as well as swallow  HEP. MBSS completed Monday - see results under "imaging". See daily note for more details of today's visit. No overt s/sx aspiration PNA today. Pt needs to have something PO for muscle memory and mitigate muscle atrophy  so SLP stressed WATER ONLY (after thorough oral care <30 minutes prior), Continued skilled ST remains necessary to educate pt how to maximize swallowing potential, and to cont to maintain pulmonary health/reduce risk of aspiration pna. Pt likely d/c'd after next session    Speech Therapy Frequency --   one more visit first week January   Duration --   90 days - 27 total visits   Treatment/Interventions Aspiration precaution training;Pharyngeal strengthening exercises;Diet toleration management by SLP;Trials of upgraded texture/liquids;Internal/external aids;Patient/family education;Compensatory strategies;Oral motor exercises;Environmental controls;SLP instruction and feedback;Other (comment)    Potential to Achieve Goals Fair    Potential Considerations Co-morbidities;Previous level of function;Severity of impairments    Consulted and Agree with Plan of Care Patient             Patient will benefit from skilled therapeutic intervention in order to improve the following deficits and impairments:   Dysphagia, oropharyngeal phase  Voice hoarseness  Dysarthria and anarthria    Problem List Patient Active Problem List   Diagnosis Date Noted   Chronic hyponatremia 02/20/2021   Dysphagia 12/12/2020   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   ACE-inhibitor cough 05/25/2015   Essential hypertension 03/21/2015   Allergic rhinitis 12/14/2013   ED (erectile dysfunction) 12/14/2013   Diverticulosis    History of oropharyngeal cancer 09/18/2010    Andreana Klingerman, CCC-SLP 10/31/2021, 1:10 PM  [facility] [address] [place], [place], [address] Phone: [phone]   Fax:  [phone]   Name: JERRON FLAIR MRN: [id number] Date of Birth: 1951-07-01

## 2021-11-21 ENCOUNTER — Ambulatory Visit: Payer: Medicare Other | Attending: Family Medicine

## 2021-11-21 ENCOUNTER — Other Ambulatory Visit: Payer: Self-pay

## 2021-11-21 DIAGNOSIS — R1312 Dysphagia, oropharyngeal phase: Secondary | ICD-10-CM | POA: Diagnosis not present

## 2021-11-21 DIAGNOSIS — R471 Dysarthria and anarthria: Secondary | ICD-10-CM | POA: Diagnosis not present

## 2021-11-21 DIAGNOSIS — R490 Dysphonia: Secondary | ICD-10-CM | POA: Diagnosis not present

## 2021-11-21 NOTE — Therapy (Signed)
[facility] [address], [address] [place], [place], [address] Phone: [phone]   Fax:  [phone]  Speech Language Pathology Treatment and Recert/discharge summary  Patient Details  Name: Lee Barnes MRN: [id number] Date of Birth: 21-Mar-1951 Referring Provider (SLP): Dr. Sharlot Gowda   Encounter Date: 11/21/2021   End of Session - 11/21/21 1355     Visit Number 26    Number of Visits 27    Date for SLP Re-Evaluation 11/21/21    Authorization Type none    SLP Start Time 1104    SLP Stop Time  1141    SLP Time Calculation (min) 37 min    Activity Tolerance Patient tolerated treatment well             Past Medical History:  Diagnosis Date   Allergy to environmental factors    Complication of anesthesia    "vageled" after surgery -overnite stay   Diverticulosis 2008   Heart murmur    hx of in childhood    History of chemotherapy    History of radiation therapy 11/05/10-12/26/10   r base tongue, 7000 cGy 35 sessions   Hypertension    Medication started in May 2016.   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   Tinnitus     Past Surgical History:  Procedure Laterality Date   APPENDECTOMY     COLONOSCOPY     INGUINAL HERNIA REPAIR Bilateral 03/30/2015   Procedure: LAPAROSCOPIC BILATERAL INGUINAL HERNIA REPAIR WITH MESH;  Surgeon: Abigail Miyamoto, MD;  Location: [facility];  Service: General;  Laterality: Bilateral;   INGUINAL HERNIA REPAIR Right 1960   INGUINAL HERNIA REPAIR Bilateral 03/30/2015   INGUINAL HERNIA REPAIR Left 07/11/2015   INGUINAL HERNIA REPAIR N/A 07/11/2015   Procedure: REPAIR OF LEFT INGUINAL HERNIA WITH MESH;  Surgeon: Abigail Miyamoto, MD;  Location: [facility] OR;  Service: General;  Laterality: N/A;   INSERTION OF MESH N/A 07/11/2015   Procedure: INSERTION OF MESH;  Surgeon: Abigail Miyamoto, MD;  Location: [facility] OR;  Service: General;   Laterality: N/A;   LUMBAR DISC SURGERY     TONSILLECTOMY      There were no vitals filed for this visit.   SPEECH THERAPY RECERT/2DISCHARGE SUMMARY  Visits from Start of Care: 26  Current functional level related to goals / functional outcomes: See goals below. Pt will be re-certed for his visit today and then will be d/c'd.   Remaining deficits: Oropharyngeal dysphagia.   Education / Equipment: HEP, precautions for POs, aspiration PNA s/sx, see below  Patient agrees to discharge. Patient goals were partially met. Patient is being discharged due to maximized rehab potential. .      Subjective Assessment - 11/21/21 1109     Subjective "If I'm in public I don't like (following precautions)."                   ADULT SLP TREATMENT - 11/21/21 1113       General Information   Behavior/Cognition Alert;Cooperative;Pleasant mood      Treatment Provided   Treatment provided Dysphagia      Dysphagia Treatment   Temperature Spikes Noted No    Respiratory Status Room air    Oral Cavity - Dentition Adequate natural dentition    Treatment Methods Therapeutic exercise;Patient/caregiver education    Patient observed directly with PO's No  Other treatment/comments No POs in session today due to pt did not perform oral care. He is being "cautious" about POs at this time. He has not kept up with HEP and water consumption due to mother being ill/busy schedule, however completed his HEP independently today without cues necessary, including EMST/IMST (inspiratory and expiratory devices). SLP reiterated pt will need 6-8 weeks of HEP completion to build back any strength lost from the last two-three weeks of non-completion. SLP reminded pt that he should also have water each day after thorough oral care, and should incr to BID H2O 2-3 weeks prior to planned pleasure POs to minimize risk of aspiration. Pt and wife demonstrated understanding of this information.      Assessment /  Recommendations / Plan   Plan Discharge SLP treatment due to (comment)   pt maxed rehab potential at this time     Progression Toward Goals   Progression toward goals --   d/c day - see goals             SLP Education - 11/21/21 1355     Education Details see pt daily note    Person(s) Educated Patient;Spouse    Methods Explanation    Comprehension Verbalized understanding              SLP Short Term Goals - 07/09/21 1145       SLP SHORT TERM GOAL #1   Title Pt will follow swallow precautions with rare min A over 2 sessions    Time 1    Period Weeks    Status Not Met      SLP SHORT TERM GOAL #2   Title Pt will complete HEP for dysphagia with rare min A over 2 sessions    Baseline 04/02/21    Time 1    Period Weeks    Status Partially Met      SLP SHORT TERM GOAL #3   Title Pt will complete RMT HEP of 3 sets of 25 repetitions of IMT and EMT at 60-70% of MIP and MEP 5/7 days for 4 weeks    Time 1    Period Weeks    Status Not Met              SLP Long Term Goals - 11/21/21 1357       SLP LONG TERM GOAL #1   Title Pt will follow swallow precautions with mod I over 2 sessions starting 08-22-21    Baseline 10-03-21    Time --    Status Partially Met    Target Date 11/21/21      SLP LONG TERM GOAL #2   Title Pt will complete HEP for dysphagia with mod I over 2 sessions    Baseline 05/02/21, 05/07/21    Status Achieved      SLP LONG TERM GOAL #3   Title Pt will demonstrate improved airway protection, glottal closure by not contracting aspiration pna over 12 weeks.    Baseline 10-03-21    Status Achieved   from "not met"     SLP LONG TERM GOAL #4   Title Pt will complete HEP for dysphagia with mod I over 3 sessions, beginning 08-22-21    Baseline 11-21-21    Time --    Period --    Status Partially Met   and continue     SLP LONG TERM GOAL #5   Title pt will complete 25 adequate reps of EMST with at least 55  cm H2O and of IMST with at least 25 cm H2O  (target at least 60 for EMST and at least 28 for IMST) in 3 sessions    Baseline 11-21-21    Time --    Period --    Status Partially Met   and ongoing             Plan - 11/21/21 1356     Clinical Impression Statement [name] reports he has not completed EMST and IMST as well as swallow HEP due to family busy-ness. See daily note for more details of today's visit. No overt s/sx aspiration PNA today. Pt has reached max potential in skilled ST and will be d/c'd after today's visit, at this time.    Treatment/Interventions Aspiration precaution training;Pharyngeal strengthening exercises;Diet toleration management by SLP;Trials of upgraded texture/liquids;Internal/external aids;Patient/family education;Compensatory strategies;Oral motor exercises;Environmental controls;SLP instruction and feedback;Other (comment)    Potential to Achieve Goals Fair    Potential Considerations Co-morbidities;Previous level of function;Severity of impairments    Consulted and Agree with Plan of Care Patient             Patient will benefit from skilled therapeutic intervention in order to improve the following deficits and impairments:   Dysphagia, oropharyngeal phase  Dysarthria and anarthria  Voice hoarseness    Problem List Patient Active Problem List   Diagnosis Date Noted   Chronic hyponatremia 02/20/2021   Dysphagia 12/12/2020   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 [place], [place], [address] Phone: [phone]   Fax:  [phone]   Name: SAMATAR CLABOUGH MRN: [id number] Date of Birth: 09-08-1951

## 2021-11-27 DIAGNOSIS — H903 Sensorineural hearing loss, bilateral: Secondary | ICD-10-CM | POA: Diagnosis not present

## 2021-11-27 DIAGNOSIS — H9121 Sudden idiopathic hearing loss, right ear: Secondary | ICD-10-CM | POA: Diagnosis not present

## 2021-11-28 DIAGNOSIS — R1310 Dysphagia, unspecified: Secondary | ICD-10-CM | POA: Diagnosis not present

## 2021-11-28 DIAGNOSIS — Z85810 Personal history of malignant neoplasm of tongue: Secondary | ICD-10-CM | POA: Diagnosis not present

## 2021-11-28 DIAGNOSIS — K117 Disturbances of salivary secretion: Secondary | ICD-10-CM | POA: Diagnosis not present

## 2021-11-28 DIAGNOSIS — B379 Candidiasis, unspecified: Secondary | ICD-10-CM | POA: Diagnosis not present

## 2021-11-29 ENCOUNTER — Encounter (HOSPITAL_COMMUNITY): Payer: Self-pay

## 2021-11-29 ENCOUNTER — Inpatient Hospital Stay (HOSPITAL_COMMUNITY)
Admission: EM | Admit: 2021-11-29 | Discharge: 2021-12-03 | DRG: 178 | Disposition: A | Discharge status: Home / Self Care | Payer: Medicare Other | Attending: Family Medicine | Admitting: Family Medicine

## 2021-11-29 ENCOUNTER — Emergency Department (HOSPITAL_COMMUNITY): Payer: Medicare Other

## 2021-11-29 ENCOUNTER — Other Ambulatory Visit: Payer: Self-pay

## 2021-11-29 DIAGNOSIS — Z85819 Personal history of malignant neoplasm of unspecified site of lip, oral cavity, and pharynx: Secondary | ICD-10-CM | POA: Diagnosis present

## 2021-11-29 DIAGNOSIS — Z9221 Personal history of antineoplastic chemotherapy: Secondary | ICD-10-CM | POA: Diagnosis not present

## 2021-11-29 DIAGNOSIS — Z6822 Body mass index (BMI) 22.0-22.9, adult: Secondary | ICD-10-CM | POA: Diagnosis not present

## 2021-11-29 DIAGNOSIS — R0602 Shortness of breath: Secondary | ICD-10-CM | POA: Diagnosis not present

## 2021-11-29 DIAGNOSIS — I10 Essential (primary) hypertension: Secondary | ICD-10-CM | POA: Diagnosis present

## 2021-11-29 DIAGNOSIS — K9429 Other complications of gastrostomy: Secondary | ICD-10-CM | POA: Diagnosis not present

## 2021-11-29 DIAGNOSIS — R1310 Dysphagia, unspecified: Secondary | ICD-10-CM | POA: Diagnosis present

## 2021-11-29 DIAGNOSIS — Z79899 Other long term (current) drug therapy: Secondary | ICD-10-CM | POA: Diagnosis not present

## 2021-11-29 DIAGNOSIS — Z20822 Contact with and (suspected) exposure to covid-19: Secondary | ICD-10-CM | POA: Diagnosis present

## 2021-11-29 DIAGNOSIS — R509 Fever, unspecified: Secondary | ICD-10-CM | POA: Diagnosis not present

## 2021-11-29 DIAGNOSIS — H9191 Unspecified hearing loss, right ear: Secondary | ICD-10-CM | POA: Diagnosis present

## 2021-11-29 DIAGNOSIS — Z8701 Personal history of pneumonia (recurrent): Secondary | ICD-10-CM | POA: Diagnosis not present

## 2021-11-29 DIAGNOSIS — Z885 Allergy status to narcotic agent status: Secondary | ICD-10-CM

## 2021-11-29 DIAGNOSIS — Z923 Personal history of irradiation: Secondary | ICD-10-CM

## 2021-11-29 DIAGNOSIS — E871 Hypo-osmolality and hyponatremia: Secondary | ICD-10-CM | POA: Diagnosis present

## 2021-11-29 DIAGNOSIS — K9420 Gastrostomy complication, unspecified: Secondary | ICD-10-CM

## 2021-11-29 DIAGNOSIS — R059 Cough, unspecified: Secondary | ICD-10-CM | POA: Diagnosis not present

## 2021-11-29 DIAGNOSIS — Z743 Need for continuous supervision: Secondary | ICD-10-CM | POA: Diagnosis not present

## 2021-11-29 DIAGNOSIS — J690 Pneumonitis due to inhalation of food and vomit: Secondary | ICD-10-CM | POA: Diagnosis not present

## 2021-11-29 DIAGNOSIS — Y838 Other surgical procedures as the cause of abnormal reaction of the patient, or of later complication, without mention of misadventure at the time of the procedure: Secondary | ICD-10-CM | POA: Diagnosis not present

## 2021-11-29 DIAGNOSIS — E440 Moderate protein-calorie malnutrition: Secondary | ICD-10-CM | POA: Diagnosis present

## 2021-11-29 DIAGNOSIS — Z85818 Personal history of malignant neoplasm of other sites of lip, oral cavity, and pharynx: Secondary | ICD-10-CM

## 2021-11-29 DIAGNOSIS — I517 Cardiomegaly: Secondary | ICD-10-CM | POA: Diagnosis not present

## 2021-11-29 DIAGNOSIS — R531 Weakness: Secondary | ICD-10-CM | POA: Diagnosis not present

## 2021-11-29 LAB — CBC
HCT: 41.4 % (ref 39.0–52.0)
Hemoglobin: 13.8 g/dL (ref 13.0–17.0)
MCH: 28.3 pg (ref 26.0–34.0)
MCHC: 33.3 g/dL (ref 30.0–36.0)
MCV: 84.8 fL (ref 80.0–100.0)
Platelets: 210 10*3/uL (ref 150–400)
RBC: 4.88 MIL/uL (ref 4.22–5.81)
RDW: 15.4 % (ref 11.5–15.5)
WBC: 6.7 10*3/uL (ref 4.0–10.5)
nRBC: 0 % (ref 0.0–0.2)

## 2021-11-29 LAB — BASIC METABOLIC PANEL
Anion gap: 6 (ref 5–15)
BUN: 30 mg/dL — ABNORMAL HIGH (ref 8–23)
CO2: 25 mmol/L (ref 22–32)
Calcium: 9.4 mg/dL (ref 8.9–10.3)
Chloride: 94 mmol/L — ABNORMAL LOW (ref 98–111)
Creatinine, Ser: 0.87 mg/dL (ref 0.61–1.24)
GFR, Estimated: >60 mL/min (ref >60)
Glucose, Bld: 96 mg/dL (ref 70–99)
Potassium: 5.2 mmol/L — ABNORMAL HIGH (ref 3.5–5.1)
Sodium: 125 mmol/L — ABNORMAL LOW (ref 135–145)

## 2021-11-29 LAB — URINALYSIS, ROUTINE W REFLEX MICROSCOPIC
Bilirubin Urine: NEGATIVE
Glucose, UA: NEGATIVE mg/dL
Hgb urine dipstick: NEGATIVE
Ketones, ur: NEGATIVE mg/dL
Leukocytes,Ua: NEGATIVE
Nitrite: NEGATIVE
Protein, ur: NEGATIVE mg/dL
Specific Gravity, Urine: 1.01 (ref 1.005–1.030)
pH: 7 (ref 5.0–8.0)

## 2021-11-29 LAB — LACTIC ACID, PLASMA: Lactic Acid, Venous: 0.9 mmol/L (ref 0.5–1.9)

## 2021-11-29 MED ORDER — PIPERACILLIN-TAZOBACTAM 3.375 G IVPB 30 MIN
3.3750 g | Freq: Once | INTRAVENOUS | Status: AC
  Administered 2021-11-29: 3.375 g via INTRAVENOUS
  Filled 2021-11-29: qty 50

## 2021-11-29 MED ORDER — CLONIDINE HCL 0.1 MG PO TABS
0.1000 mg | ORAL_TABLET | Freq: Once | ORAL | Status: DC
  Filled 2021-11-29: qty 1

## 2021-11-29 MED ORDER — CLONIDINE HCL 0.1 MG PO TABS
0.1000 mg | ORAL_TABLET | Freq: Once | ORAL | Status: AC
  Administered 2021-11-29: 0.1 mg

## 2021-11-29 NOTE — H&P (Signed)
History and Physical    Lee Barnes Lee Barnes:[id number] DOB: 1951/07/08 DOA: 11/29/2021  PCP: Ronnald Nian, MD   Patient coming from: Home  Chief Complaint: Weakness, fever, cough  HPI: Lee Barnes is a 71 y.o. male with medical history significant for hx of oaropharyngeal cancer s/p treatment, has G tube for feeding, HTN, aspiration pneumonia who presents with cough and shortness of breath for the last few days.  He reports he has a chronic cough that has gotten worse in the last 3 to 4 days.  He has had shortness of breath over the past few days as well.  He states on Monday he did eat a few bites of potatoes, green beans and some meat as he has been told he can have "pleasure eating" for a few bites.  Wife states she was not watching him and is not sure how many bites he actually took.  After that time he started to get more short of breath and increased cough.  Earlier today he had a fever of 102 degrees at home.  He has not had any blood with coughing.  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  He has been admitted 3-4 times in the last year for aspiration pneumonia.  Patient has been through speech therapy and continues to follow the recommendations Tobacco, alcohol, illicit drug use.  Lives with his wife.  ED Course: In the emergency room he has had some elevated blood pressure readings which he states is normal when he comes to the hospital.  He is not on antihypertensives at home.  He has been afebrile in the emergency room.  Found to have a right lower lobe infiltrate on chest x-ray.  Lab work shows WBC 6700 hemoglobin 13.8 hematocrit 41.4 platelets 210,000 lactic acid 0.9, sodium 125 potassium 5.2 chloride 94 bicarb 25 creatinine 0.87 BUN 30 glucose 96.  COVID and influenza swab pending, urinalysis negative.  Hospitalist service asked to admit for further management  Review of Systems:   General: Reports weakness, fever, chills. Denies weight loss, night sweats.  Denies dizziness.   HENT: Denies head trauma, headache, denies tinnitus. Denies nasal bleeding. Denies sore throat.  Denies difficulty swallowing Eyes: Denies blurry vision, pain in eye, drainage.  Denies discoloration of eyes. Neck: Denies pain.  Denies swelling.  Denies pain with movement. Cardiovascular: Denies chest pain, palpitations.  Denies edema.  Denies orthopnea Respiratory: Reports chronic cough that is worse past few days. Denies wheezing.  Denies sputum production Gastrointestinal: Denies abdominal pain, swelling.  Denies nausea, vomiting, diarrhea.  Denies melena.  Denies hematemesis. Musculoskeletal: Denies limitation of movement.  Denies deformity or swelling. Denies arthralgias or myalgias. Genitourinary: Denies pelvic pain.  Denies urinary frequency or hesitancy.  Denies dysuria.  Skin: Denies rash.  Denies petechiae, purpura, ecchymosis. Neurological: Denies syncope.  Denies seizure activity. Denies slurred speech, drooping face. Denies visual change. Psychiatric: Denies depression, anxiety. Denies hallucinations.  Past Medical History:  Diagnosis Date   Allergy to environmental factors    Complication of anesthesia    "vageled" after surgery -overnite stay   Diverticulosis 2008   Heart murmur    hx of in childhood    History of chemotherapy    History of radiation therapy 11/05/10-12/26/10   r base tongue, 7000 cGy 35 sessions   Hypertension    Medication started in May 2016.   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   Tinnitus     Past Surgical History:  Procedure Laterality Date   APPENDECTOMY     COLONOSCOPY     INGUINAL HERNIA REPAIR Bilateral 03/30/2015   Procedure: LAPAROSCOPIC BILATERAL INGUINAL HERNIA REPAIR WITH MESH;  Surgeon: Abigail Miyamoto, MD;  Location: [facility];  Service: General;  Laterality:  Bilateral;   INGUINAL HERNIA REPAIR Right 1960   INGUINAL HERNIA REPAIR Bilateral 03/30/2015   INGUINAL HERNIA REPAIR Left 07/11/2015   INGUINAL HERNIA REPAIR N/A 07/11/2015   Procedure: REPAIR OF LEFT INGUINAL HERNIA WITH MESH;  Surgeon: Abigail Miyamoto, MD;  Location: [facility] OR;  Service: General;  Laterality: N/A;   INSERTION OF MESH N/A 07/11/2015   Procedure: INSERTION OF MESH;  Surgeon: Abigail Miyamoto, MD;  Location: [facility] OR;  Service: General;  Laterality: N/A;   LUMBAR DISC SURGERY     TONSILLECTOMY      Social History  reports that he has never smoked. He has never used smokeless tobacco. He reports current alcohol use. He reports that he does not use drugs.  Allergies  Allergen Reactions   Codeine Nausea And Vomiting    Family History  Problem Relation Age of Onset   Heart disease Father    Cancer Father        throat ca   Heart disease Mother    Colon cancer Neg Hx      Prior to Admission medications   Medication Sig Start Date End Date Taking? Authorizing Provider  acetaminophen (TYLENOL) 500 MG tablet Take 500 mg by mouth every 6 (six) hours as needed for mild pain or moderate pain.    [provider]  amLODipine (NORVASC) 5 MG tablet Take 1 tablet (5 mg total) by mouth every evening. 02/28/21   Ronnald Nian, MD  cetirizine (ZYRTEC) 10 MG chewable tablet Chew 10 mg by mouth at bedtime. Patient not taking: No sig reported    [provider]  fluticasone (FLONASE) 50 MCG/ACT nasal spray SPRAY TWO SPRAYS IN EACH NOSTRIL ONCE DAILY AS NEEDED 10/29/21   Ronnald Nian, MD  triamcinolone cream (KENALOG) 0.1 %  10/23/20   [provider]    Physical Exam: Vitals:   11/29/21 2100 11/29/21 2130 11/29/21 2230 11/29/21 2330  BP: (!) 189/123 (!) 213/132 (!) 147/100 (!) 163/101  Pulse: 90 94 88 93  Resp: 16 (!) 21 18 17   Temp:      TempSrc:      SpO2: 99% 97% 97% 96%    Constitutional: NAD, calm, comfortable Vitals:   11/29/21 2100 11/29/21  2130 11/29/21 2230 11/29/21 2330  BP: (!) 189/123 (!) 213/132 (!) 147/100 (!) 163/101  Pulse: 90 94 88 93  Resp: 16 (!) 21 18 17   Temp:      TempSrc:      SpO2: 99% 97% 97% 96%   General: WDWN, Alert and oriented x3.  Eyes: EOMI, PERRL, conjunctivae normal.  Sclera nonicteric HENT:  [place]/AT, external ears normal.  Nares patent without epistasis.  Mucous membranes are moist. Posterior pharynx clear of any exudate.  Neck: Soft, normal range of motion, supple, no masses, Trachea midline Respiratory: Diminished breath sounds in right base with rhonchi in right base.  Left lung fields clear, no wheezing, no crackles. Normal respiratory effort. No accessory muscle use.  Cardiovascular: Regular rate and rhythm, no murmurs / rubs / gallops. No extremity edema. Abdomen:  Soft, no tenderness, nondistended, no rebound or guarding.  No masses palpated. G-tube in place with mild redness at lower edge without drainage. Bowel sounds normoactive Musculoskeletal: FROM. no cyanosis. No joint deformity upper and lower extremities. Normal muscle tone.  Skin: Warm, dry, intact no rashes, lesions, ulcers. No induration Neurologic: CN 2-12 grossly intact.  Normal speech.  Sensation intact Psychiatric: Normal judgment and insight. Normal mood.    Labs on Admission: I have personally reviewed following labs and imaging studies  CBC: Recent Labs  Lab 11/29/21 2200  WBC 6.7  HGB 13.8  HCT 41.4  MCV 84.8  PLT 210    Basic Metabolic Panel: Recent Labs  Lab 11/29/21 2200  NA 125*  K 5.2*  CL 94*  CO2 25  GLUCOSE 96  BUN 30*  CREATININE 0.87  CALCIUM 9.4    GFR: CrCl cannot be calculated (Unknown ideal weight.).  Liver Function Tests: No results for input(s): AST, ALT, ALKPHOS, BILITOT, PROT, ALBUMIN in the last 168 hours.  Urine analysis:    Component Value Date/Time   COLORURINE YELLOW 11/29/2021 2210   APPEARANCEUR CLEAR 11/29/2021 2210   LABSPEC 1.010 11/29/2021 2210   LABSPEC 1.015  12/13/2010 1028   PHURINE 7.0 11/29/2021 2210   GLUCOSEU NEGATIVE 11/29/2021 2210   HGBUR NEGATIVE 11/29/2021 2210   BILIRUBINUR NEGATIVE 11/29/2021 2210   BILIRUBINUR n 02/19/2016 1519   BILIRUBINUR Negative 12/13/2010 1028   KETONESUR NEGATIVE 11/29/2021 2210   PROTEINUR NEGATIVE 11/29/2021 2210   UROBILINOGEN negative 02/19/2016 1519   UROBILINOGEN 0.2 04/01/2015 2316   NITRITE NEGATIVE 11/29/2021 2210   LEUKOCYTESUR NEGATIVE 11/29/2021 2210   LEUKOCYTESUR Negative 12/13/2010 1028    Radiological Exams on Admission: DG Chest Portable 1 View  Result Date: 11/29/2021 CLINICAL DATA:  Possible pneumonia. EXAM: PORTABLE CHEST 1 VIEW COMPARISON:  Chest radiograph dated 04/26/2021. FINDINGS: There is eventration of the right hemidiaphragm. Right lung base density, likely atelectasis. Pneumonia is not excluded. The left lung is clear. No pneumothorax. No pleural effusion. Mild cardiomegaly. No acute osseous pathology. IMPRESSION: 1. Right lung base atelectasis versus infiltrate. 2. Mild cardiomegaly. Electronically Signed   By: Elgie Collard M.D.   On: 11/29/2021 20:38    EKG: Independently reviewed.  EKG shows normal sinus rhythm with no acute ST elevation or depression.  QTc 425  Assessment/Plan Principal Problem:   Aspiration pneumonia  Lee Barnes is admitted to Med Surg floor Placed on Unasyn for aspiration pneumonia.  Incentive spirometer every 2 hours while awake.  Check CBC in am Supplemental oxygen if needed to keep O2 sat 92-96%. Pt currently maintaining oxygenation on room air. Pt is NPO and will continue with G-tube feedings  Active Problems:   Chronic hyponatremia IVF hydration with LR at 75 ml/hr Check electrolytes and renal function in am.    Essential hypertension Pt reports BP is variable at home and is not on antihypertensives at this time at home. Was given clonidine in ER.  Monitor BP    History of oropharyngeal cancer   DVT prophylaxis: Lovenox for DVT  prophylaxis.  Code Status:   Full Code  Family Communication:  Diagnosis and plan discussed with patient.  Patient and wife verbalized understanding agrees with plan.  Further recommendation follow as clinical indicated Disposition Plan:   Patient is from:  Home  Anticipated DC to:  Home  Anticipated DC date:  Anticipate 2 midnight or more stay in the hospital  Time spent on admission: 55 minutes  Admission status:  Inpatient  Lee Severance Sampson Self MD [facility]  How to contact the [facility] Attending or Consulting provider 7A - 7P or covering provider during after hours 7P -7A, for this patient?   Check the care team in [facility] and look for a) attending/consulting [facility] provider listed and b) the [facility] team listed Log into [email or web address] and use [place]'s universal password to access. If you do not have the password, please contact the hospital operator. Locate the [facility] provider you are looking for under [facility] and page to a number that you can be directly reached. If you still have difficulty reaching the provider, please page the [facility] (Director on Call) for the Hospitalists listed on amion for assistance.  11/29/2021, 11:46 PM

## 2021-11-29 NOTE — ED Provider Notes (Signed)
[facility] DEPT Provider Note   CSN: [id number] Arrival date & time: 11/29/21  2004     History  Chief Complaint  Patient presents with   Fever   Hypertension    Lee Barnes is a 71 y.o. male.  71 year old male with history of aspiration pneumonia presents with cough and shortness of breath times several weeks.  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  Notes that 2 days ago he ate a meal that he should not have.  Since that time he has had trouble with coughing.  Denies hemoptysis.  Had a fever at home this evening.  Presents for further evaluation      Home Medications Prior to Admission medications   Medication Sig Start Date End Date Taking? Authorizing Provider  acetaminophen (TYLENOL) 500 MG tablet Take 500 mg by mouth every 6 (six) hours as needed for mild pain or moderate pain.    [provider]  amLODipine (NORVASC) 5 MG tablet Take 1 tablet (5 mg total) by mouth every evening. 02/28/21   Denita Lung, MD  cetirizine (ZYRTEC) 10 MG chewable tablet Chew 10 mg by mouth at bedtime. Patient not taking: No sig reported    [provider]  fluticasone (FLONASE) 50 MCG/ACT nasal spray SPRAY TWO SPRAYS IN EACH NOSTRIL ONCE DAILY AS NEEDED 10/29/21   Denita Lung, MD  triamcinolone cream (KENALOG) 0.1 %  10/23/20   [provider]      Allergies    Codeine    Review of Systems   Review of Systems  All other systems reviewed and are negative.  Physical Exam Updated Vital Signs BP (!) 232/136 (BP Location: Right Arm)    Pulse 98    Temp 98.5 F (36.9 C) (Oral)    Resp 18    SpO2 97%  Physical Exam Vitals and nursing note reviewed.  Constitutional:      General: He is not in acute distress.    Appearance: Normal appearance. He is well-developed. He is not toxic-appearing.  HENT:     Head: Normocephalic and atraumatic.  Eyes:     General: Lids  are normal.     Conjunctiva/sclera: Conjunctivae normal.     Pupils: Pupils are equal, round, and reactive to light.  Neck:     Thyroid: No thyroid mass.     Trachea: No tracheal deviation.  Cardiovascular:     Rate and Rhythm: Normal rate and regular rhythm.     Heart sounds: Normal heart sounds. No murmur heard.   No gallop.  Pulmonary:     Effort: Pulmonary effort is normal. No respiratory distress.     Breath sounds: Normal breath sounds. No stridor. No decreased breath sounds, wheezing, rhonchi or rales.  Abdominal:     General: There is no distension.     Palpations: Abdomen is soft.     Tenderness: There is no abdominal tenderness. There is no rebound.  Musculoskeletal:        General: No tenderness. Normal range of motion.     Cervical back: Normal range of motion and neck supple.  Skin:    General: Skin is warm and dry.     Findings: No abrasion or rash.  Neurological:     Mental Status: He is alert and oriented to person, place, and time. Mental status is at baseline.     GCS: GCS eye subscore is 4. GCS  verbal subscore is 5. GCS motor subscore is 6.     Cranial Nerves: No cranial nerve deficit.     Sensory: No sensory deficit.     Motor: Motor function is intact.  Psychiatric:        Attention and Perception: Attention normal.        Speech: Speech normal.        Behavior: Behavior normal.    ED Results / Procedures / Treatments   Labs (all labs ordered are listed, but only abnormal results are displayed) Labs Reviewed  RESP PANEL BY RT-PCR (FLU A&B, COVID) ARPGX2  CULTURE, BLOOD (ROUTINE X 2)  CULTURE, BLOOD (ROUTINE X 2)  BASIC METABOLIC PANEL  CBC  URINALYSIS, ROUTINE W REFLEX MICROSCOPIC  LACTIC ACID, PLASMA  CBG MONITORING, ED    EKG EKG Interpretation  Date/Time:  Thursday November 29 2021 20:16:45 EST Ventricular Rate:  93 PR Interval:  171 QRS Duration: 86 QT Interval:  341 QTC Calculation: 425 R Axis:   -39 Text Interpretation: Sinus rhythm  Left axis deviation Borderline ST elevation, anterior leads Confirmed by Lacretia Leigh ([id number]) on 11/29/2021 11:01:47 PM  Radiology DG Chest Portable 1 View  Result Date: 11/29/2021 CLINICAL DATA:  Possible pneumonia. EXAM: PORTABLE CHEST 1 VIEW COMPARISON:  Chest radiograph dated 04/26/2021. FINDINGS: There is eventration of the right hemidiaphragm. Right lung base density, likely atelectasis. Pneumonia is not excluded. The left lung is clear. No pneumothorax. No pleural effusion. Mild cardiomegaly. No acute osseous pathology. IMPRESSION: 1. Right lung base atelectasis versus infiltrate. 2. Mild cardiomegaly. Electronically Signed   By: Anner Crete M.D.   On: 11/29/2021 20:38    Procedures Procedures    Medications Ordered in ED Medications  piperacillin-tazobactam (ZOSYN) IVPB 3.375 g (has no administration in time range)  cloNIDine (CATAPRES) tablet 0.1 mg (has no administration in time range)    ED Course/ Medical Decision Making/ A&P                           Medical Decision Making Patient's chest x-ray consistent with pneumonia.  Likely aspiration in etiology given his history of eating when he should not be due to his underlying swallowing dysfunction.  Mild hyponatremia noted with some of 125.  Patient does note that he feels weak and tired.  Patient started on Zosyn for coverage of anaerobic's from likely aspiration.  Lactate normal.  Plan will be to admit to the hospital           Final Clinical Impression(s) / ED Diagnoses Final diagnoses:  None    Rx / DC Orders ED Discharge Orders     None         Lacretia Leigh, MD 11/29/21 2302

## 2021-11-29 NOTE — ED Notes (Signed)
Called talked to secretary to get purple man

## 2021-11-29 NOTE — ED Triage Notes (Signed)
Patient BIB [facility] from home. Called out by wife who said the past 2 days, patient has been feeling weak. Patient went to doc yesterday, diagnosed with fungal infection in throat. Patient noticed today fever off 100.63F. wife said she thinks its pneumonia. Lung sounds clear, no SOB. Patient is feeding tube only. History of hypertension, not medicated for the past few years since its been under control.   EMS vitals 252/122

## 2021-11-30 LAB — SODIUM, URINE, RANDOM: Sodium, Ur: 83 mmol/L

## 2021-11-30 LAB — BASIC METABOLIC PANEL
Anion gap: 10 (ref 5–15)
Anion gap: 8 (ref 5–15)
BUN: 25 mg/dL — ABNORMAL HIGH (ref 8–23)
BUN: 23 mg/dL (ref 8–23)
CO2: 22 mmol/L (ref 22–32)
CO2: 25 mmol/L (ref 22–32)
Calcium: 8.9 mg/dL (ref 8.9–10.3)
Calcium: 8.8 mg/dL — ABNORMAL LOW (ref 8.9–10.3)
Chloride: 92 mmol/L — ABNORMAL LOW (ref 98–111)
Chloride: 95 mmol/L — ABNORMAL LOW (ref 98–111)
Creatinine, Ser: 0.75 mg/dL (ref 0.61–1.24)
Creatinine, Ser: 0.86 mg/dL (ref 0.61–1.24)
GFR, Estimated: >60 mL/min (ref >60)
GFR, Estimated: >60 mL/min (ref >60)
Glucose, Bld: 95 mg/dL (ref 70–99)
Glucose, Bld: 88 mg/dL (ref 70–99)
Potassium: 4.7 mmol/L (ref 3.5–5.1)
Potassium: 4.4 mmol/L (ref 3.5–5.1)
Sodium: 124 mmol/L — ABNORMAL LOW (ref 135–145)
Sodium: 128 mmol/L — ABNORMAL LOW (ref 135–145)

## 2021-11-30 LAB — CBC
HCT: 35.2 % — ABNORMAL LOW (ref 39.0–52.0)
Hemoglobin: 11.8 g/dL — ABNORMAL LOW (ref 13.0–17.0)
MCH: 28.6 pg (ref 26.0–34.0)
MCHC: 33.5 g/dL (ref 30.0–36.0)
MCV: 85.2 fL (ref 80.0–100.0)
Platelets: 189 10*3/uL (ref 150–400)
RBC: 4.13 MIL/uL — ABNORMAL LOW (ref 4.22–5.81)
RDW: 15.4 % (ref 11.5–15.5)
WBC: 9.7 10*3/uL (ref 4.0–10.5)
nRBC: 0 % (ref 0.0–0.2)

## 2021-11-30 LAB — TSH: TSH: 1.671 u[IU]/mL (ref 0.350–4.500)

## 2021-11-30 LAB — RESP PANEL BY RT-PCR (FLU A&B, COVID) ARPGX2
Influenza A by PCR: NEGATIVE
Influenza B by PCR: NEGATIVE
SARS Coronavirus 2 by RT PCR: NEGATIVE

## 2021-11-30 LAB — CORTISOL: Cortisol, Plasma: 12.3 ug/dL

## 2021-11-30 LAB — OSMOLALITY, URINE: Osmolality, Ur: 578 mOsm/kg (ref 300–900)

## 2021-11-30 LAB — OSMOLALITY: Osmolality: 271 mOsm/kg — ABNORMAL LOW (ref 275–295)

## 2021-11-30 MED ORDER — SODIUM CHLORIDE 0.9 % IV SOLN: 3.0000 g | Freq: Four times a day (QID) | INTRAVENOUS | Status: DC

## 2021-11-30 MED ORDER — NON FORMULARY: 375.0000 mL | Freq: Four times a day (QID) | Status: DC

## 2021-11-30 MED ORDER — ONDANSETRON HCL 4 MG PO TABS: 4.0000 mg | ORAL_TABLET | Freq: Four times a day (QID) | ORAL | Status: DC | PRN

## 2021-11-30 MED ORDER — ALBUTEROL SULFATE (2.5 MG/3ML) 0.083% IN NEBU: 2.5000 mg | INHALATION_SOLUTION | RESPIRATORY_TRACT | Status: DC | PRN

## 2021-11-30 MED ORDER — ISOSOURCE 1.5 CAL PO LIQD
375.0000 mL | Freq: Four times a day (QID) | ORAL | Status: DC
  Administered 2021-11-30: 250
  Administered 2021-11-30: 250 mL
  Administered 2021-12-01 – 2021-12-02 (×6): 375 mL
  Filled 2021-11-30: qty 1

## 2021-11-30 MED ORDER — ENOXAPARIN SODIUM 40 MG/0.4ML IJ SOSY
40.0000 mg | PREFILLED_SYRINGE | INTRAMUSCULAR | Status: DC
  Administered 2021-11-30 – 2021-12-02 (×3): 40 mg via SUBCUTANEOUS
  Filled 2021-11-30 (×3): qty 0.4

## 2021-11-30 MED ORDER — ACETAMINOPHEN 650 MG RE SUPP: 650.0000 mg | Freq: Four times a day (QID) | RECTAL | Status: DC | PRN

## 2021-11-30 MED ORDER — ONDANSETRON HCL 4 MG/2ML IJ SOLN
4.0000 mg | Freq: Four times a day (QID) | INTRAMUSCULAR | Status: DC | PRN
Start: 2021-11-30 — End: 2021-12-03

## 2021-11-30 MED ORDER — LOPERAMIDE HCL 2 MG PO CAPS
2.0000 mg | ORAL_CAPSULE | Freq: Once | ORAL | Status: AC
  Administered 2021-11-30: 2 mg via ORAL
  Filled 2021-11-30: qty 1

## 2021-11-30 MED ORDER — ACETAMINOPHEN 325 MG PO TABS
650.0000 mg | ORAL_TABLET | Freq: Four times a day (QID) | ORAL | Status: DC | PRN
  Filled 2021-11-30: qty 2

## 2021-11-30 MED ORDER — LACTATED RINGERS IV SOLN: INTRAVENOUS | Status: DC

## 2021-11-30 MED ORDER — SODIUM CHLORIDE 0.9 % IV SOLN
3.0000 g | Freq: Four times a day (QID) | INTRAVENOUS | Status: DC
  Administered 2021-11-30 – 2021-12-01 (×7): 3 g via INTRAVENOUS
  Filled 2021-11-30 (×9): qty 8

## 2021-11-30 NOTE — Progress Notes (Signed)
rm 1317, Lee Barnes, has BP 178/105, Pulse 81.  States he does not take any antihypertensives at home.  We'll recheck later.

## 2021-11-30 NOTE — Progress Notes (Signed)
Transition of Care [facility]) Screening Note  Patient Details  Name: Lee Barnes Date of Birth: 1951-11-11  Transition of Care [facility]) CM/SW Contact:    Sherie Don, LCSW Phone Number: 11/30/2021, 10:57 AM  Transition of Care Department [facility]) has reviewed patient and no TOC needs have been identified at this time. We will continue to monitor patient advancement through interdisciplinary progression rounds. If new patient transition needs arise, please place a TOC consult.

## 2021-11-30 NOTE — Progress Notes (Signed)
PROGRESS NOTE    Lee Barnes  ZOX:[id number] DOB: 07-16-1951 DOA: 11/29/2021 PCP: Ronnald Nian, MD  Chief Complaint  Patient presents with   Fever   Hypertension    Brief Narrative:  Lee Barnes is Lee Barnes 71 y.o. male with medical history significant for hx of oaropharyngeal cancer s/p treatment, has G tube for feeding, HTN, aspiration pneumonia who presents with cough and shortness of breath for the last few days.  He reports he has Lee Barnes chronic cough that has gotten worse in the last 3 to 4 days.  He has had shortness of breath over the past few days as well.  He states on Monday he did eat Lee Barnes few bites of potatoes, green beans and some meat as he has been told he can have "pleasure eating" for Lee Barnes few bites.  Wife states she was not watching him and is not sure how many bites he actually took.  After that time he started to get more short of breath and increased cough.  Earlier today he had Lee Barnes fever of 102 degrees at home.  He has not had any blood with coughing.  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  He has been admitted 3-4 times in the last year for aspiration pneumonia.  Patient has been through speech therapy and continues to follow the recommendations Tobacco, alcohol, illicit drug use.  Lives with his wife.   ED Course: In the emergency room he has had some elevated blood pressure readings which he states is normal when he comes to the hospital.  He is not on antihypertensives at home.  He has been afebrile in the emergency room.  Found to have [name] right lower lobe infiltrate on chest x-ray.  Lab work shows WBC 6700 hemoglobin 13.8 hematocrit 41.4 platelets 210,000 lactic acid 0.9, sodium 125 potassium 5.2 chloride 94 bicarb 25 creatinine 0.87 BUN 30 glucose 96.  COVID and influenza swab pending, urinalysis negative.  Hospitalist service asked to admit for further management    Assessment &  Plan:   Principal Problem:   Aspiration pneumonia (HCC) Active Problems:   Chronic hyponatremia   History of oropharyngeal cancer   Essential hypertension   * Aspiration pneumonia (HCC)- (present on admission) Presented with generalized weakness, fever CXR with right lung base infiltrate Continue NPO and G tube feeds At home, he eats infrequently, only occasionally for pleasure -> will have SLP reeval Continue unasyn  Blood cx pending  Chronic hyponatremia- (present on admission) Acute on chronic Appears euvolemic on my exam today He's improving with IVF, will continue for now Possibly worsened in setting of his aspiration event, acute illness Gradually improving, will continue to monitor Normal TSH, follow AM cortisol Further follow up outpatient  History of oropharyngeal cancer- (present on admission) Noted, S/p treatment with cisplatin and radiation.  Last note from Dr. Bertis Ruddy in 2015, no evidence of recurrence from 03/21/2013 imaging study. G tube for feeding Follow with outpatient providers  Essential hypertension- (present on admission) BP ok today, follow Reportedly not taking norvasc   DVT prophylaxis: lovenox Code Status: full Family Communication: wife at bedside Disposition:   Status is: Inpatient  Remains inpatient appropriate because: need for continued IV abx, low sodium       Consultants:  none  Procedures:  none  Antimicrobials:  Anti-infectives (From admission, onward)    Start     Dose/Rate  Route Frequency Ordered Stop   11/30/21 0400  Ampicillin-Sulbactam (UNASYN) 3 g in sodium chloride 0.9 % 100 mL IVPB        3 g 200 mL/hr over 30 Minutes Intravenous Every 6 hours 11/30/21 0007     11/30/21 0004  Ampicillin-Sulbactam (UNASYN) 3 g in sodium chloride 0.9 % 100 mL IVPB  Status:  Discontinued        3 g 200 mL/hr over 30 Minutes Intravenous Every 6 hours 11/30/21 0004 11/30/21 0006   11/29/21 2100  piperacillin-tazobactam (ZOSYN) IVPB  3.375 g        3.375 g 100 mL/hr over 30 Minutes Intravenous  Once 11/29/21 2053 11/29/21 2310       Subjective: No complaints  Objective: Vitals:   11/30/21 0248 11/30/21 0611 11/30/21 1024 11/30/21 1232  BP: 135/89 114/73 (!) 145/83 108/75  Pulse: 92 88 82 79  Resp: 17 18 16 16   Temp: 99.8 F (37.7 C) 98.8 F (37.1 C) 98.5 F (36.9 C) 98 F (36.7 C)  TempSrc: Oral Oral    SpO2: 95% 96% 99% 96%  Weight:        Intake/Output Summary (Last 24 hours) at 11/30/2021 1843 Last data filed at 11/30/2021 1400 Gross per 24 hour  Intake 1244.21 ml  Output 900 ml  Net 344.21 ml   Filed Weights   11/30/21 0000  Weight: 74.2 kg    Examination:  General exam: Appears calm and comfortable  Respiratory system: unlabored  Cardiovascular system: RRR, appears euvolemic Gastrointestinal system: Abdomen is nondistended, soft and nontender.  G tube present. Central nervous system: Alert and oriented. No focal neurological deficits. Extremities: no LEE Skin: No rashes, lesions or ulcers Psychiatry: Judgement and insight appear normal. Mood & affect appropriate.     Data Reviewed: I have personally reviewed following labs and imaging studies  CBC: Recent Labs  Lab 11/29/21 2200 11/30/21 0402  WBC 6.7 9.7  HGB 13.8 11.8*  HCT 41.4 35.2*  MCV 84.8 85.2  PLT 210 189    Basic Metabolic Panel: Recent Labs  Lab 11/29/21 2200 11/30/21 0402 11/30/21 1552  NA 125* 124* 128*  K 5.2* 4.7 4.4  CL 94* 92* 95*  CO2 25 22 25   GLUCOSE 96 95 88  BUN 30* 25* 23  CREATININE 0.87 0.75 0.86  CALCIUM 9.4 8.9 8.8*    GFR: Estimated Creatinine Clearance: 83.9 mL/min (by C-G formula based on SCr of 0.86 mg/dL).  Liver Function Tests: No results for input(s): AST, ALT, ALKPHOS, BILITOT, PROT, ALBUMIN in the last 168 hours.  CBG: No results for input(s): GLUCAP in the last 168 hours.   Recent Results (from the past 240 hour(s))  Resp Panel by RT-PCR (Flu Lee Barnes&B, Covid)  Nasopharyngeal Swab     Status: None   Collection Time: 11/29/21 10:10 PM   Specimen: Nasopharyngeal Swab; Nasopharyngeal(NP) swabs in vial transport medium  Result Value Ref Range Status   SARS Coronavirus 2 by RT PCR NEGATIVE NEGATIVE Final    Comment: (NOTE) SARS-CoV-2 target nucleic acids are NOT DETECTED.  The SARS-CoV-2 RNA is generally detectable in upper respiratory specimens during the acute phase of infection. The lowest concentration of SARS-CoV-2 viral copies this assay can detect is 138 copies/mL. Lee Barnes negative result does not preclude SARS-Cov-2 infection and should not be used as the sole basis for treatment or other patient management decisions. Lee Barnes negative result may occur with  improper specimen collection/handling, submission of specimen other than nasopharyngeal swab, presence of viral  mutation(s) within the areas targeted by this assay, and inadequate number of viral copies(<138 copies/mL). Lee Barnes negative result must be combined with clinical observations, patient history, and epidemiological information. The expected result is Negative.  Fact Sheet for Patients:  BloggerCourse.com  Fact Sheet for Healthcare Providers:  SeriousBroker.it  This test is no t yet approved or cleared by the [place] FDA and  has been authorized for detection and/or diagnosis of SARS-CoV-2 by FDA under an Emergency Use Authorization (EUA). This EUA will remain  in effect (meaning this test can be used) for the duration of the COVID-19 declaration under Section 564(b)(1) of the Act, 21 U.S.C.section 360bbb-3(b)(1), unless the authorization is terminated  or revoked sooner.       Influenza Lee Barnes by PCR NEGATIVE NEGATIVE Final   Influenza B by PCR NEGATIVE NEGATIVE Final    Comment: (NOTE) The Xpert Xpress SARS-CoV-2/FLU/RSV plus assay is intended as an aid in the diagnosis of influenza from Nasopharyngeal swab specimens and should not be  used as Lee Barnes sole basis for treatment. Nasal washings and aspirates are unacceptable for Xpert Xpress SARS-CoV-2/FLU/RSV testing.  Fact Sheet for Patients: BloggerCourse.com  Fact Sheet for Healthcare Providers: SeriousBroker.it  This test is not yet approved or cleared by the [place] FDA and has been authorized for detection and/or diagnosis of SARS-CoV-2 by FDA under an Emergency Use Authorization (EUA). This EUA will remain in effect (meaning this test can be used) for the duration of the COVID-19 declaration under Section 564(b)(1) of the Act, 21 U.S.C. section 360bbb-3(b)(1), unless the authorization is terminated or revoked.  Performed at [facility], [address], [place], [place] [address]          Radiology Studies: DG Chest Portable 1 View  Result Date: 11/29/2021 CLINICAL DATA:  Possible pneumonia. EXAM: PORTABLE CHEST 1 VIEW COMPARISON:  Chest radiograph dated 04/26/2021. FINDINGS: There is eventration of the right hemidiaphragm. Right lung base density, likely atelectasis. Pneumonia is not excluded. The left lung is clear. No pneumothorax. No pleural effusion. Mild cardiomegaly. No acute osseous pathology. IMPRESSION: 1. Right lung base atelectasis versus infiltrate. 2. Mild cardiomegaly. Electronically Signed   By: Lee Barnes M.D.   On: 11/29/2021 20:38        Scheduled Meds:  enoxaparin (LOVENOX) injection  40 mg Subcutaneous Q24H   Isosource 1.5 Cal  375 mL Per Tube QID   Continuous Infusions:  ampicillin-sulbactam (UNASYN) IV 3 g (11/30/21 1631)   lactated ringers 75 mL/hr at 11/30/21 1619     LOS: 1 day    Time spent: over 30 min    Lacretia Nicks, MD [facility]   To contact the attending provider between 7A-7P or the covering provider during after hours 7P-7A, please log into the web site [email or web address] and access using universal [place] password for  that web site. If you do not have the password, please call the hospital operator.  11/30/2021, 6:43 PM

## 2021-11-30 NOTE — Assessment & Plan Note (Addendum)
Presented with generalized weakness, fever CXR with right lung base infiltrate Continue NPO and G tube feeds At home, he eats infrequently, only occasionally for pleasure -> will have SLP reeval -> no new recommendations, follow outpatient, continue NPO until follow up with outpatient providers Complete abx with augmentin Blood cx no growth

## 2021-11-30 NOTE — Assessment & Plan Note (Signed)
Noted, S/p treatment with cisplatin and radiation.  Last note from Dr. Alvy Bimler in 2015, no evidence of recurrence from 03/21/2013 imaging study. G tube for feeding Follow with outpatient providers

## 2021-11-30 NOTE — Progress Notes (Signed)
Lee Barnes, rm 1317, asking for immodium bec His Isosource is giving him diarrhea, Stated that's what he does at home.  Thanks.

## 2021-11-30 NOTE — Assessment & Plan Note (Addendum)
Acute on chronic Appears euvolemic again on my exam today Has fluctuated and at times worsened with LR or NS Component of SIADH?  I think we can improve on his chronic hyponatremia by decreasing free water he's getting per tube.  Family had been flushing with more free water - 150 before and after tube feeds (1200 cc free water per day) - than previously prescribed - 75 before and after tube feeds (600 cc free water per day) is what they said was recommended - which could certainly worsenen his hyponatremia.  I think decreasing the amount of free water he's receiving in flushes will help improve his hyponatremia (will plan to discharge with instructions to use 50 cc before and after tube feeds (400 cc free water per day)).  He's getting 375 cc QID isosource at home (1.5 liters total volume).  Could also consider using gatorade or pedialyte instead of free water to flush (they'd increased his free water flushes due to concerns for hypotension sometimes in the morning - they give 12 oz gatorade Vadis Slabach day in the morning with free water as well). Check orthostatics -> positive, but no symptoms - follow outpatient (hold amlodipine for now, follow with PCP) Possibly worsened in setting of his aspiration event, acute illness Gradually improving, will continue to monitor Normal TSH, cort stim wnl Further follow up outpatient

## 2021-11-30 NOTE — Hospital Course (Addendum)
[name] is Lee Barnes 71 y.o. male with medical history significant for hx of oaropharyngeal cancer s/p treatment, has G tube for feeding, HTN, aspiration pneumonia who presents with cough and shortness of breath for the last few days.  He was found to have aspiration pneumonia.  He's improved with antibiotics.  See below for additional details

## 2021-11-30 NOTE — Evaluation (Signed)
Physical Therapy Evaluation Patient Details Name: Lee Barnes MRN: [id number] DOB: 05/07/1951 Today's Date: 11/30/2021  History of Present Illness  Pt admitted from home 2* weakness and dx with aspiration PNA.  Pt with hx of Oropharanx CA s/p chemo/radiation in 2011 and now on tube feed.  Clinical Impression  Pt admitted as above and reports already feeling much better.  Pt up to ambulate in hall and demonstrating IND walking sans AD including fwd, bkwd and sideways as well as at faster paces and able to manage IV pole without difficulty.  Reviewed with RN and pt free to access bathroom or ambulate on own in halls.  Pt with no current PT needs and PT service will sign off.     Recommendations for follow up therapy are one component of a multi-disciplinary discharge planning process, led by the attending physician.  Recommendations may be updated based on patient status, additional functional criteria and insurance authorization.  Follow Up Recommendations No PT follow up    Assistance Recommended at Discharge None  Patient can return home with the following       Equipment Recommendations None recommended by PT  Recommendations for Other Services       Functional Status Assessment Patient has not had a recent decline in their functional status     Precautions / Restrictions Precautions Precautions: Fall Restrictions Weight Bearing Restrictions: No      Mobility  Bed Mobility Overal bed mobility: Modified Independent             General bed mobility comments: No physical assist    Transfers Overall transfer level: Modified independent                 General transfer comment: No physical assist and good stability    Ambulation/Gait Ambulation/Gait assistance: Independent Gait Distance (Feet): 400 Feet Assistive device: None;IV Pole Gait Pattern/deviations: Step-through pattern;WFL(Within Functional Limits) Gait velocity: moderate pace      General Gait Details: Pt ambulated with IV pole and sans pole including stepping fwd, bkwd and sideways.  No balance loss and good safety awareness and stability noted throughout  Stairs            Wheelchair Mobility    Modified Rankin (Stroke Patients Only)       Balance Overall balance assessment: Independent                                           Pertinent Vitals/Pain Pain Assessment: No/denies pain    Home Living Family/patient expects to be discharged to:: Private residence Living Arrangements: Spouse/significant other Available Help at Discharge: Family Type of Home: House Home Access: Stairs to enter   Secretary/administrator of Steps: 2   Home Layout: Able to live on main level with bedroom/bathroom Home Equipment: None      Prior Function Prior Level of Function : Independent/Modified Independent                     Hand Dominance        Extremity/Trunk Assessment   Upper Extremity Assessment Upper Extremity Assessment: Overall WFL for tasks assessed    Lower Extremity Assessment Lower Extremity Assessment: Overall WFL for tasks assessed    Cervical / Trunk Assessment Cervical / Trunk Assessment: Normal  Communication   Communication: No difficulties;HOH  Cognition Arousal/Alertness: Awake/alert Behavior During Therapy: [facility] for  tasks assessed/performed Overall Cognitive Status: Within Functional Limits for tasks assessed                                          General Comments      Exercises     Assessment/Plan    PT Assessment Patient does not need any further PT services  PT Problem List         PT Treatment Interventions      PT Goals (Current goals can be found in the Care Plan section)  Acute Rehab PT Goals Patient Stated Goal: HOME PT Goal Formulation: All assessment and education complete, DC therapy    Frequency Min 1X/week     Co-evaluation                AM-PAC PT "6 Clicks" Mobility  Outcome Measure Help needed turning from your back to your side while in a flat bed without using bedrails?: None Help needed moving from lying on your back to sitting on the side of a flat bed without using bedrails?: None Help needed moving to and from a bed to a chair (including a wheelchair)?: None Help needed standing up from a chair using your arms (e.g., wheelchair or bedside chair)?: None Help needed to walk in hospital room?: None Help needed climbing 3-5 steps with a railing? : None 6 Click Score: 24    End of Session Equipment Utilized During Treatment: Gait belt Activity Tolerance: Patient tolerated treatment well Patient left: in chair;with call bell/phone within reach Nurse Communication: Mobility status PT Visit Diagnosis: Difficulty in walking, not elsewhere classified (R26.2)    Time: 1324-4010 PT Time Calculation (min) (ACUTE ONLY): 15 min   Charges:   PT Evaluation $PT Eval Low Complexity: 1 Low          Mauro Kaufmann PT Acute Rehabilitation Services Pager [phone] Office [phone]   Francies Inch 11/30/2021, 9:09 AM

## 2021-11-30 NOTE — Assessment & Plan Note (Addendum)
BP fluctuating widely Reportedly not taking norvasc He's been started on this here, continue and follow - adjust BP meds as needed outpatient - positive orthostatics today, hold amlodipine - follow BP outpatient

## 2021-11-30 NOTE — Progress Notes (Addendum)
Initial Nutrition Assessment  DOCUMENTATION CODES:   Non-severe (moderate) malnutrition in context of chronic illness  INTERVENTION:   -Patient and pt's wife prefer he remain on home formula of Isosource 1.5 -wife to bring supply to cover the weekend -1.5 cartons (375 ml) QID via G-tube -Free water flush with 75 ml before and after each feed while receiving IVFs -Provides 2250 kcals, 102g protein and 1746 ml H2O  -If switch to our formulary desired by pt and/or pt's wife, recommend [employer] 1.4 5 times daily, which would provide 2275 kcals, 100g protein and 1170 ml H2O.   NUTRITION DIAGNOSIS:   Moderate Malnutrition related to chronic illness, dysphagia as evidenced by moderate fat depletion, severe muscle depletion.  GOAL:   Patient will meet greater than or equal to 90% of their needs  MONITOR:   Labs, Weight trends, I & O's, TF tolerance  REASON FOR ASSESSMENT:   Consult Enteral/tube feeding initiation and management  ASSESSMENT:   71 y.o. male with medical history significant for hx of oaropharyngeal cancer s/p treatment, has G tube for feeding, HTN, aspiration pneumonia who presents with cough and shortness of breath for the last few days.  He reports he has a chronic cough that has gotten worse in the last 3 to 4 days.  He has had shortness of breath over the past few days as well.  He states on Monday he did eat a few bites of potatoes, green beans and some meat as he has been told he can have "pleasure eating" for a few bites.  Wife states she was not watching him and is not sure how many bites he actually took.  After that time he started to get more short of breath and increased cough.  Earlier today he had a fever of 102 degrees at home.  He has not had any blood with coughing.  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  He has been admitted 3-4 times in the last year for  aspiration pneumonia.  Patient has been through speech therapy and continues to follow the recommendations  Patient in room with wife at bedside. Per pt's wife, pt has been tolerating his tube feedings well. He has a chronic cough but no concerns that he was aspirating the tube feeding. He is always elevated during and following his feedings. Pt was recently undergoing speech therapy and was discharged, was told he could "comfort feed". Pt began eating meals. Pt has eaten 4 meals total since this clearance. Pt believes this was what caused his aspiration pneumonia.  Pt reports only 1 instance of vomiting and this was when he administered a bolus feed within 2 hours of his last.  Home TF regimen: -390 ml Isosource 1.5 QID via G-tube -Free water flush of 150 ml before and after each feed -Provides ~2250 kcals, 102g protein and 2346 ml H2O  Pt's wife states he has been placed on Jevity in the past, which is the suggested substitution on our formulary, and pt had increased diarrhea with this. They would like to avoid starting this formula. At one time he tried a plant based formula and this did not improve anything so he went back to the Isosource.   Pt's wife states she will call the pt's GI MD to see if she can have more tube feeding formula supply sent to her home. She is concerned she may not be  able to provide it if pt stays in hospital for longer than expected. Current expected discharge states 1/15.  If pt ends up unable to supply Isosource, recommend switching to [facility] 1.4 as this may be easier to tolerate and it will be temporary until he can discharge and resume the Isosource formula.   Per weight records, pt's weight has remained stable. Pt reports he was told he gained 10 lbs.  Medications reviewed.  Labs reviewed:  Low Na  NUTRITION - FOCUSED PHYSICAL EXAM:  Flowsheet Row Most Recent Value  Orbital Region No depletion  Upper Arm Region Moderate depletion  Thoracic and Lumbar  Region Unable to assess  Buccal Region Moderate depletion  Temple Region Mild depletion  Clavicle Bone Region Severe depletion  Clavicle and Acromion Bone Region Severe depletion  Scapular Bone Region Mild depletion  Dorsal Hand No depletion  Patellar Region Severe depletion  Anterior Thigh Region Severe depletion  Posterior Calf Region Severe depletion  Edema (RD Assessment) None  Hair Reviewed  Eyes Reviewed  Mouth Reviewed  Skin Reviewed       Diet Order:   Diet Order             Diet NPO time specified  Diet effective now                   EDUCATION NEEDS:   Education needs have been addressed  Skin:  Skin Assessment: Reviewed RN Assessment  Last BM:  PTA  Height:   Ht Readings from Last 1 Encounters:  09/14/21 6' (1.829 m)    Weight:   Wt Readings from Last 1 Encounters:  11/30/21 74.2 kg    BMI:  Body mass index is 22.19 kg/m.  Estimated Nutritional Needs:   Kcal:  2200-2400  Protein:  105-115g  Fluid:  2.2L/day  Clayton Bibles, MS, RD, LDN Inpatient Clinical Dietitian Contact information available via Amion

## 2021-12-01 LAB — CBC WITH DIFFERENTIAL/PLATELET
Abs Immature Granulocytes: 0.02 10*3/uL (ref 0.00–0.07)
Basophils Absolute: 0 10*3/uL (ref 0.0–0.1)
Basophils Relative: 0 %
Eosinophils Absolute: 0.1 10*3/uL (ref 0.0–0.5)
Eosinophils Relative: 2 %
HCT: 38.6 % — ABNORMAL LOW (ref 39.0–52.0)
Hemoglobin: 12.7 g/dL — ABNORMAL LOW (ref 13.0–17.0)
Immature Granulocytes: 0 %
Lymphocytes Relative: 6 %
Lymphs Abs: 0.4 10*3/uL — ABNORMAL LOW (ref 0.7–4.0)
MCH: 28.4 pg (ref 26.0–34.0)
MCHC: 32.9 g/dL (ref 30.0–36.0)
MCV: 86.4 fL (ref 80.0–100.0)
Monocytes Absolute: 1.1 10*3/uL — ABNORMAL HIGH (ref 0.1–1.0)
Monocytes Relative: 17 %
Neutro Abs: 5.1 10*3/uL (ref 1.7–7.7)
Neutrophils Relative %: 75 %
Platelets: 200 10*3/uL (ref 150–400)
RBC: 4.47 MIL/uL (ref 4.22–5.81)
RDW: 15.6 % — ABNORMAL HIGH (ref 11.5–15.5)
WBC: 6.7 10*3/uL (ref 4.0–10.5)
nRBC: 0 % (ref 0.0–0.2)

## 2021-12-01 LAB — BASIC METABOLIC PANEL
Anion gap: 5 (ref 5–15)
Anion gap: 8 (ref 5–15)
BUN: 21 mg/dL (ref 8–23)
BUN: 20 mg/dL (ref 8–23)
CO2: 27 mmol/L (ref 22–32)
CO2: 25 mmol/L (ref 22–32)
Calcium: 8.6 mg/dL — ABNORMAL LOW (ref 8.9–10.3)
Calcium: 8.7 mg/dL — ABNORMAL LOW (ref 8.9–10.3)
Chloride: 94 mmol/L — ABNORMAL LOW (ref 98–111)
Chloride: 91 mmol/L — ABNORMAL LOW (ref 98–111)
Creatinine, Ser: 0.8 mg/dL (ref 0.61–1.24)
Creatinine, Ser: 0.76 mg/dL (ref 0.61–1.24)
GFR, Estimated: >60 mL/min (ref >60)
GFR, Estimated: >60 mL/min (ref >60)
Glucose, Bld: 109 mg/dL — ABNORMAL HIGH (ref 70–99)
Glucose, Bld: 141 mg/dL — ABNORMAL HIGH (ref 70–99)
Potassium: 4.4 mmol/L (ref 3.5–5.1)
Potassium: 4.2 mmol/L (ref 3.5–5.1)
Sodium: 126 mmol/L — ABNORMAL LOW (ref 135–145)
Sodium: 124 mmol/L — ABNORMAL LOW (ref 135–145)

## 2021-12-01 LAB — MAGNESIUM: Magnesium: 2.1 mg/dL (ref 1.7–2.4)

## 2021-12-01 LAB — COMPREHENSIVE METABOLIC PANEL
ALT: 30 U/L (ref 0–44)
AST: 26 U/L (ref 15–41)
Albumin: 3.9 g/dL (ref 3.5–5.0)
Alkaline Phosphatase: 92 U/L (ref 38–126)
Anion gap: 8 (ref 5–15)
BUN: 23 mg/dL (ref 8–23)
CO2: 27 mmol/L (ref 22–32)
Calcium: 9.1 mg/dL (ref 8.9–10.3)
Chloride: 94 mmol/L — ABNORMAL LOW (ref 98–111)
Creatinine, Ser: 0.76 mg/dL (ref 0.61–1.24)
GFR, Estimated: >60 mL/min (ref >60)
Glucose, Bld: 80 mg/dL (ref 70–99)
Potassium: 4.7 mmol/L (ref 3.5–5.1)
Sodium: 129 mmol/L — ABNORMAL LOW (ref 135–145)
Total Bilirubin: 0.8 mg/dL (ref 0.3–1.2)
Total Protein: 7.6 g/dL (ref 6.5–8.1)

## 2021-12-01 LAB — PHOSPHORUS: Phosphorus: 3 mg/dL (ref 2.5–4.6)

## 2021-12-01 LAB — CORTISOL: Cortisol, Plasma: 10.8 ug/dL

## 2021-12-01 MED ORDER — AMLODIPINE BESYLATE 5 MG PO TABS
5.0000 mg | ORAL_TABLET | Freq: Every day | ORAL | Status: DC
  Administered 2021-12-01 – 2021-12-02 (×2): 5 mg via ORAL
  Filled 2021-12-01 (×2): qty 1

## 2021-12-01 MED ORDER — LOPERAMIDE HCL 2 MG PO CAPS
2.0000 mg | ORAL_CAPSULE | ORAL | Status: DC | PRN
  Administered 2021-12-01: 2 mg via ORAL
  Filled 2021-12-01: qty 1

## 2021-12-01 MED ORDER — SODIUM CHLORIDE 0.9 % IV SOLN: INTRAVENOUS | Status: DC

## 2021-12-01 MED ORDER — AMOXICILLIN-POT CLAVULANATE 400-57 MG/5ML PO SUSR
875.0000 mg | Freq: Two times a day (BID) | ORAL | Status: DC
  Administered 2021-12-01 – 2021-12-02 (×2): 872 mg
  Filled 2021-12-01 (×2): qty 10.9

## 2021-12-01 MED ORDER — COSYNTROPIN 0.25 MG IJ SOLR
0.2500 mg | INTRAMUSCULAR | Status: AC
  Administered 2021-12-01: 0.25 mg via INTRAVENOUS
  Filled 2021-12-01: qty 0.25

## 2021-12-01 NOTE — Progress Notes (Signed)
°   12/01/21 1257  Vitals  Temp 98.7 F (37.1 C)  Temp Source Oral  BP (!) 168/109  MAP (mmHg) 126  BP Location Right Arm  BP Method Automatic  Patient Position (if appropriate) Lying  Pulse Rate 93  Pulse Rate Source Monitor  Resp 14  MEWS COLOR  MEWS Score Color Green  Oxygen Therapy  SpO2 96 %  O2 Device Room Air  MEWS Score  MEWS Temp 0  MEWS Systolic 0  MEWS Pulse 0  MEWS RR 0  MEWS LOC 0  MEWS Score 0

## 2021-12-01 NOTE — Progress Notes (Signed)
Lee Barnes, Lee Barnes, 80 yoM, Adm w/  Aspiration Pneumonia, BP 179/106 Pulse 91, this am.  Thanks.

## 2021-12-01 NOTE — Progress Notes (Signed)
PROGRESS NOTE    Lee Barnes  LKG:[id number] DOB: Jun 14, 1951 DOA: 11/29/2021 PCP: Ronnald Nian, MD  Chief Complaint  Patient presents with   Fever   Hypertension    Brief Narrative:  Lee Barnes is Lee Barnes 71 y.o. male with medical history significant for hx of oaropharyngeal cancer s/p treatment, has G tube for feeding, HTN, aspiration pneumonia who presents with cough and shortness of breath for the last few days.  He reports he has Lee Barnes chronic cough that has gotten worse in the last 3 to 4 days.  He has had shortness of breath over the past few days as well.  He states on Monday he did eat Lee Barnes few bites of potatoes, green beans and some meat as he has been told he can have "pleasure eating" for Lee Barnes few bites.  Wife states she was not watching him and is not sure how many bites he actually took.  After that time he started to get more short of breath and increased cough.  Earlier today he had Lee Barnes fever of 102 degrees at home.  He has not had any blood with coughing.  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  He has been admitted 3-4 times in the last year for aspiration pneumonia.  Patient has been through speech therapy and continues to follow the recommendations Tobacco, alcohol, illicit drug use.  Lives with his wife.   ED Course: In the emergency room he has had some elevated blood pressure readings which he states is normal when he comes to the hospital.  He is not on antihypertensives at home.  He has been afebrile in the emergency room.  Found to have Simuel Stebner right lower lobe infiltrate on chest x-ray.  Lab work shows WBC 6700 hemoglobin 13.8 hematocrit 41.4 platelets 210,000 lactic acid 0.9, sodium 125 potassium 5.2 chloride 94 bicarb 25 creatinine 0.87 BUN 30 glucose 96.  COVID and influenza swab pending, urinalysis negative.  Hospitalist service asked to admit for further management    Assessment &  Plan:   Principal Problem:   Aspiration pneumonia (HCC) Active Problems:   Chronic hyponatremia   History of oropharyngeal cancer   Essential hypertension   * Aspiration pneumonia (HCC)- (present on admission) Presented with generalized weakness, fever CXR with right lung base infiltrate Continue NPO and G tube feeds At home, he eats infrequently, only occasionally for pleasure -> will have SLP reeval -> no new recommendations, follow outpatient Continue unasyn  Blood cx pending  Chronic hyponatremia- (present on admission) Acute on chronic Appears euvolemic on my exam today 126 after improvement to 129 Component of SIADH?  Change to NS with worsening on LR (higher sodium content) and follow for now  At home, they use more free water than prescribed 150 cc before and after tube feeds instead of 75, instructed them to use lower amount as this can worsen hyponatremia as well.   Possibly worsened in setting of his aspiration event, acute illness Gradually improving, will continue to monitor Normal TSH, cort stim Further follow up outpatient  History of oropharyngeal cancer- (present on admission) Noted, S/p treatment with cisplatin and radiation.  Last note from Dr. Bertis Ruddy in 2015, no evidence of recurrence from 03/21/2013 imaging study. G tube for feeding Follow with outpatient providers  Essential hypertension- (present on admission) BP fluctuating widely Reportedly not taking norvasc He's been started on this here,  continue and follow - adjust BP meds as needed   DVT prophylaxis: lovenox Code Status: full Family Communication: wife at bedside Disposition:   Status is: Inpatient  Remains inpatient appropriate because: need for continued IV abx, low sodium       Consultants:  none  Procedures:  none  Antimicrobials:  Anti-infectives (From admission, onward)    Start     Dose/Rate Route Frequency Ordered Stop   11/30/21 0400  Ampicillin-Sulbactam (UNASYN) 3  g in sodium chloride 0.9 % 100 mL IVPB        3 g 200 mL/hr over 30 Minutes Intravenous Every 6 hours 11/30/21 0007     11/30/21 0004  Ampicillin-Sulbactam (UNASYN) 3 g in sodium chloride 0.9 % 100 mL IVPB  Status:  Discontinued        3 g 200 mL/hr over 30 Minutes Intravenous Every 6 hours 11/30/21 0004 11/30/21 0006   11/29/21 2100  piperacillin-tazobactam (ZOSYN) IVPB 3.375 g        3.375 g 100 mL/hr over 30 Minutes Intravenous  Once 11/29/21 2053 11/29/21 2310       Subjective: No new complaints  Objective: Vitals:   11/30/21 2143 12/01/21 0546 12/01/21 0603 12/01/21 1257  BP: (!) 178/99 (!) 179/106 (!) 175/94 (!) 168/109  Pulse: 81 91 92 93  Resp: 18 16 18 14   Temp: 99.2 F (37.3 C) 98.5 F (36.9 C) 98.6 F (37 C) 98.7 F (37.1 C)  TempSrc: Oral Oral Oral Oral  SpO2: 97% 95% 100% 96%  Weight:  74.8 kg      Intake/Output Summary (Last 24 hours) at 12/01/2021 1857 Last data filed at 12/01/2021 1728 Gross per 24 hour  Intake 2613.69 ml  Output --  Net 2613.69 ml   Filed Weights   11/30/21 0000 12/01/21 0546  Weight: 74.2 kg 74.8 kg    Examination:  General: No acute distress. Cardiovascular: RRR Lungs: unlabored Abdomen: Soft, nontender, nondistended - peg Neurological: Alert and oriented 3. Moves all extremities 4 . Cranial nerves II through XII grossly intact. Skin: Warm and dry. No rashes or lesions. Extremities: No clubbing or cyanosis. No edema   Data Reviewed: I have personally reviewed following labs and imaging studies  CBC: Recent Labs  Lab 11/29/21 2200 11/30/21 0402 12/01/21 0517  WBC 6.7 9.7 6.7  NEUTROABS  --   --  5.1  HGB 13.8 11.8* 12.7*  HCT 41.4 35.2* 38.6*  MCV 84.8 85.2 86.4  PLT 210 189 200    Basic Metabolic Panel: Recent Labs  Lab 11/29/21 2200 11/30/21 0402 11/30/21 1552 12/01/21 0517 12/01/21 1243  NA 125* 124* 128* 129* 126*  K 5.2* 4.7 4.4 4.7 4.4  CL 94* 92* 95* 94* 94*  CO2 25 22 25 27 27   GLUCOSE 96 95  88 80 109*  BUN 30* 25* 23 23 21   CREATININE 0.87 0.75 0.86 0.76 0.80  CALCIUM 9.4 8.9 8.8* 9.1 8.6*  MG  --   --   --  2.1  --   PHOS  --   --   --  3.0  --     GFR: Estimated Creatinine Clearance: 90.9 mL/min (by C-G formula based on SCr of 0.8 mg/dL).  Liver Function Tests: Recent Labs  Lab 12/01/21 0517  AST 26  ALT 30  ALKPHOS 92  BILITOT 0.8  PROT 7.6  ALBUMIN 3.9    CBG: No results for input(s): GLUCAP in the last 168 hours.   Recent Results (from the  past 240 hour(s))  Culture, blood (Routine X 2) w Reflex to ID Panel     Status: None (Preliminary result)   Collection Time: 11/29/21 10:00 PM   Specimen: BLOOD  Result Value Ref Range Status   Specimen Description   Final    BLOOD BLOOD RIGHT FOREARM Performed at [facility], [address] [place], [place] [address]    Special Requests   Final    BOTTLES DRAWN AEROBIC AND ANAEROBIC Blood Culture adequate volume Performed at [facility], [address], [place], [place] [address]    Culture   Final    NO GROWTH 1 DAY Performed at [facility], [address] [place], [place] [address]    Report Status PENDING  Incomplete  Resp Panel by RT-PCR (Flu Sundi Slevin&B, Covid) Nasopharyngeal Swab     Status: None   Collection Time: 11/29/21 10:10 PM   Specimen: Nasopharyngeal Swab; Nasopharyngeal(NP) swabs in vial transport medium  Result Value Ref Range Status   SARS Coronavirus 2 by RT PCR NEGATIVE NEGATIVE Final    Comment: (NOTE) SARS-CoV-2 target nucleic acids are NOT DETECTED.  The SARS-CoV-2 RNA is generally detectable in upper respiratory specimens during the acute phase of infection. The lowest concentration of SARS-CoV-2 viral copies this assay can detect is 138 copies/mL. Demitrios Molyneux negative result does not preclude SARS-Cov-2 infection and should not be used as the sole basis for treatment or other patient management decisions. Chaela Branscum negative result may occur with  improper  specimen collection/handling, submission of specimen other than nasopharyngeal swab, presence of viral mutation(s) within the areas targeted by this assay, and inadequate number of viral copies(<138 copies/mL). Lee Barnes negative result must be combined with clinical observations, patient history, and epidemiological information. The expected result is Negative.  Fact Sheet for Patients:  BloggerCourse.com  Fact Sheet for Healthcare Providers:  SeriousBroker.it  This test is no t yet approved or cleared by the [place] FDA and  has been authorized for detection and/or diagnosis of SARS-CoV-2 by FDA under an Emergency Use Authorization (EUA). This EUA will remain  in effect (meaning this test can be used) for the duration of the COVID-19 declaration under Section 564(b)(1) of the Act, 21 U.S.C.section 360bbb-3(b)(1), unless the authorization is terminated  or revoked sooner.       Influenza Selmer Adduci by PCR NEGATIVE NEGATIVE Final   Influenza B by PCR NEGATIVE NEGATIVE Final    Comment: (NOTE) The Xpert Xpress SARS-CoV-2/FLU/RSV plus assay is intended as an aid in the diagnosis of influenza from Nasopharyngeal swab specimens and should not be used as Azell Bill sole basis for treatment. Nasal washings and aspirates are unacceptable for Xpert Xpress SARS-CoV-2/FLU/RSV testing.  Fact Sheet for Patients: BloggerCourse.com  Fact Sheet for Healthcare Providers: SeriousBroker.it  This test is not yet approved or cleared by the [place] FDA and has been authorized for detection and/or diagnosis of SARS-CoV-2 by FDA under an Emergency Use Authorization (EUA). This EUA will remain in effect (meaning this test can be used) for the duration of the COVID-19 declaration under Section 564(b)(1) of the Act, 21 U.S.C. section 360bbb-3(b)(1), unless the authorization is terminated or revoked.  Performed at  [facility], [address] [place], [place] [address]   Culture, blood (Routine X 2) w Reflex to ID Panel     Status: None (Preliminary result)   Collection Time: 11/29/21 10:10 PM   Specimen: BLOOD  Result Value Ref Range Status   Specimen Description  Final    BLOOD RIGHT HAND Performed at [facility], [address] [place], [place] [address]    Special Requests   Final    BOTTLES DRAWN AEROBIC AND ANAEROBIC Blood Culture adequate volume Performed at [facility], [address] [place], [place] [address]    Culture   Final    NO GROWTH 1 DAY Performed at [facility], [address], [place], [place] [address]    Report Status PENDING  Incomplete         Radiology Studies: DG Chest Portable 1 View  Result Date: 11/29/2021 CLINICAL DATA:  Possible pneumonia. EXAM: PORTABLE CHEST 1 VIEW COMPARISON:  Chest radiograph dated 04/26/2021. FINDINGS: There is eventration of the right hemidiaphragm. Right lung base density, likely atelectasis. Pneumonia is not excluded. The left lung is clear. No pneumothorax. No pleural effusion. Mild cardiomegaly. No acute osseous pathology. IMPRESSION: 1. Right lung base atelectasis versus infiltrate. 2. Mild cardiomegaly. Electronically Signed   By: Elgie Collard M.D.   On: 11/29/2021 20:38        Scheduled Meds:  amLODipine  5 mg Oral Daily   enoxaparin (LOVENOX) injection  40 mg Subcutaneous Q24H   Isosource 1.5 Cal  375 mL Per Tube QID   Continuous Infusions:  sodium chloride 75 mL/hr at 12/01/21 1538   ampicillin-sulbactam (UNASYN) IV 3 g (12/01/21 1536)     LOS: 2 days    Time spent: over 30 min    Lacretia Nicks, MD [facility]   To contact the attending provider between 7A-7P or the covering provider during after hours 7P-7A, please log into the web site [email or web address] and access using universal  password for that web site. If you do  not have the password, please call the hospital operator.  12/01/2021, 6:57 PM

## 2021-12-01 NOTE — Progress Notes (Signed)
SLP Cancellation Note  Patient Details Name: Lee Barnes MRN: [id number] DOB: [birth year]   Cancelled assessment: Spoke with Mr. and Mrs. Spoonemore at length. He has a long, complicated hx of dysphagia that has waxed and waned. He has participated in extensive dysphagia therapy at [facility] and undergone multiple MBS studies.  He and his wife have an excellent understanding of the scope of his dysphagia and its consequences.  We agree that it is best that he focus on recovery, continue home-exercise program, continue PEG for nutrition, and address resuming POs after he recovers from acute illness.  Dr. Florene Glen present for latter part of conversation.   Will cancel swallow assessment and respectfully sign off at this time.  The Serres's will call Garald Balding, their OP SLP, if they need further intervention.  Dima Ferrufino L. Tivis Ringer, [place] CCC/SLP Acute Rehabilitation Services Office number [phone] Pager [phone]      Juan Quam Laurice 12/01/2021, 12:01 PM

## 2021-12-02 DIAGNOSIS — K9420 Gastrostomy complication, unspecified: Secondary | ICD-10-CM

## 2021-12-02 LAB — CBC WITH DIFFERENTIAL/PLATELET
Abs Immature Granulocytes: 0.02 10*3/uL (ref 0.00–0.07)
Basophils Absolute: 0 10*3/uL (ref 0.0–0.1)
Basophils Relative: 0 %
Eosinophils Absolute: 0 10*3/uL (ref 0.0–0.5)
Eosinophils Relative: 1 %
HCT: 31.9 % — ABNORMAL LOW (ref 39.0–52.0)
Hemoglobin: 10.6 g/dL — ABNORMAL LOW (ref 13.0–17.0)
Immature Granulocytes: 0 %
Lymphocytes Relative: 6 %
Lymphs Abs: 0.4 10*3/uL — ABNORMAL LOW (ref 0.7–4.0)
MCH: 28.6 pg (ref 26.0–34.0)
MCHC: 33.2 g/dL (ref 30.0–36.0)
MCV: 86 fL (ref 80.0–100.0)
Monocytes Absolute: 1 10*3/uL (ref 0.1–1.0)
Monocytes Relative: 19 %
Neutro Abs: 4.2 10*3/uL (ref 1.7–7.7)
Neutrophils Relative %: 74 %
Platelets: 193 10*3/uL (ref 150–400)
RBC: 3.71 MIL/uL — ABNORMAL LOW (ref 4.22–5.81)
RDW: 15.6 % — ABNORMAL HIGH (ref 11.5–15.5)
WBC: 5.6 10*3/uL (ref 4.0–10.5)
nRBC: 0 % (ref 0.0–0.2)

## 2021-12-02 LAB — COMPREHENSIVE METABOLIC PANEL
ALT: 25 U/L (ref 0–44)
AST: 22 U/L (ref 15–41)
Albumin: 3.4 g/dL — ABNORMAL LOW (ref 3.5–5.0)
Alkaline Phosphatase: 76 U/L (ref 38–126)
Anion gap: 6 (ref 5–15)
BUN: 20 mg/dL (ref 8–23)
CO2: 26 mmol/L (ref 22–32)
Calcium: 8.6 mg/dL — ABNORMAL LOW (ref 8.9–10.3)
Chloride: 95 mmol/L — ABNORMAL LOW (ref 98–111)
Creatinine, Ser: 0.73 mg/dL (ref 0.61–1.24)
GFR, Estimated: >60 mL/min (ref >60)
Glucose, Bld: 136 mg/dL — ABNORMAL HIGH (ref 70–99)
Potassium: 4.5 mmol/L (ref 3.5–5.1)
Sodium: 127 mmol/L — ABNORMAL LOW (ref 135–145)
Total Bilirubin: 0.5 mg/dL (ref 0.3–1.2)
Total Protein: 6.6 g/dL (ref 6.5–8.1)

## 2021-12-02 LAB — BASIC METABOLIC PANEL
Anion gap: 7 (ref 5–15)
BUN: 23 mg/dL (ref 8–23)
CO2: 27 mmol/L (ref 22–32)
Calcium: 8.7 mg/dL — ABNORMAL LOW (ref 8.9–10.3)
Chloride: 94 mmol/L — ABNORMAL LOW (ref 98–111)
Creatinine, Ser: 0.76 mg/dL (ref 0.61–1.24)
GFR, Estimated: >60 mL/min (ref >60)
Glucose, Bld: 119 mg/dL — ABNORMAL HIGH (ref 70–99)
Potassium: 4.3 mmol/L (ref 3.5–5.1)
Sodium: 128 mmol/L — ABNORMAL LOW (ref 135–145)

## 2021-12-02 LAB — MAGNESIUM: Magnesium: 2.2 mg/dL (ref 1.7–2.4)

## 2021-12-02 LAB — ACTH STIMULATION, 3 TIME POINTS
Cortisol, 30 Min: 26.7 ug/dL
Cortisol, 60 Min: 32.3 ug/dL
Cortisol, Base: 18 ug/dL

## 2021-12-02 LAB — PHOSPHORUS: Phosphorus: 3.2 mg/dL (ref 2.5–4.6)

## 2021-12-02 MED ORDER — DEXTROSE-NACL 5-0.9 % IV SOLN: INTRAVENOUS | Status: DC

## 2021-12-02 MED ORDER — SODIUM CHLORIDE 0.9 % IV SOLN
3.0000 g | Freq: Four times a day (QID) | INTRAVENOUS | Status: DC
  Administered 2021-12-02 – 2021-12-03 (×3): 3 g via INTRAVENOUS
  Filled 2021-12-02 (×5): qty 8

## 2021-12-02 NOTE — Progress Notes (Signed)
Patient went to the bathroom and pulled his pant down then accidentally feeding tube pulled out. Notified MD Florene Glen. IR radiology consult. Applied 4x4 gauge and Abd pad dressing. Will continue monitor.

## 2021-12-02 NOTE — Assessment & Plan Note (Addendum)
Dislodged when he went to bathroom Originally placed July 2022 IR to eval tomorrow -> replaced today, 1/16

## 2021-12-02 NOTE — Progress Notes (Signed)
Pharmacy Antibiotic Note  Lee Barnes is a 71 y.o. male admitted on 11/29/2021 with aspiration pneumonia.  Pharmacy has been consulted for Unasyn dosing.  Plan: Unasyn 3g IV q6h Follow up renal function, culture results, and clinical course.   Weight: 74.8 kg (164 lb 14.5 oz)  Temp (24hrs), Avg:99.1 F (37.3 C), Min:98.3 F (36.8 C), Max:99.7 F (37.6 C)  Recent Labs  Lab 11/29/21 2200 11/29/21 2202 11/30/21 0402 11/30/21 1552 12/01/21 0517 12/01/21 1243 12/01/21 1958 12/02/21 0416 12/02/21 1428  WBC 6.7  --  9.7  --  6.7  --   --  5.6  --   CREATININE 0.87  --  0.75   < > 0.76 0.80 0.76 0.73 0.76  LATICACIDVEN  --  0.9  --   --   --   --   --   --   --    < > = values in this interval not displayed.    Estimated Creatinine Clearance: 90.9 mL/min (by C-G formula based on SCr of 0.76 mg/dL).    Allergies  Allergen Reactions   Codeine Nausea And Vomiting    Thank you for allowing pharmacy to be a part of this patients care.  Gretta Arab PharmD, BCPS Clinical Pharmacist [facility] main pharmacy [phone] 12/02/2021 3:51 PM

## 2021-12-02 NOTE — Progress Notes (Signed)
PROGRESS NOTE    Lee Barnes  ZOX:[id number] DOB: 12-Mar-1951 DOA: 11/29/2021 PCP: Ronnald Nian, MD  Chief Complaint  Patient presents with   Fever   Hypertension    Brief Narrative:  Lee Barnes is Lee Barnes 71 y.o. male with medical history significant for hx of oaropharyngeal cancer s/p treatment, has G tube for feeding, HTN, aspiration pneumonia who presents with cough and shortness of breath for the last few days.  He reports he has Lee Barnes chronic cough that has gotten worse in the last 3 to 4 days.  He has had shortness of breath over the past few days as well.  He states on Monday he did eat [name] few bites of potatoes, green beans and some meat as he has been told he can have "pleasure eating" for Lee Barnes few bites.  Wife states she was not watching him and is not sure how many bites he actually took.  After that time he started to get more short of breath and increased cough.  Earlier today he had Lee Barnes fever of 102 degrees at home.  He has not had any blood with coughing.  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  He has been admitted 3-4 times in the last year for aspiration pneumonia.  Patient has been through speech therapy and continues to follow the recommendations Tobacco, alcohol, illicit drug use.  Lives with his wife.   ED Course: In the emergency room he has had some elevated blood pressure readings which he states is normal when he comes to the hospital.  He is not on antihypertensives at home.  He has been afebrile in the emergency room.  Found to have Lee Barnes right lower lobe infiltrate on chest x-ray.  Lab work shows WBC 6700 hemoglobin 13.8 hematocrit 41.4 platelets 210,000 lactic acid 0.9, sodium 125 potassium 5.2 chloride 94 bicarb 25 creatinine 0.87 BUN 30 glucose 96.  COVID and influenza swab pending, urinalysis negative.  Hospitalist service asked to admit for further management    Assessment &  Plan:   Principal Problem:   Aspiration pneumonia (HCC) Active Problems:   Chronic hyponatremia   History of oropharyngeal cancer   Essential hypertension   Problem with gastrostomy tube (HCC)   * Aspiration pneumonia (HCC)- (present on admission) Presented with generalized weakness, fever CXR with right lung base infiltrate Continue NPO and G tube feeds At home, he eats infrequently, only occasionally for pleasure -> will have SLP reeval -> no new recommendations, follow outpatient Continue unasyn  Blood cx no growth  Chronic hyponatremia- (present on admission) Acute on chronic Appears euvolemic again on my exam today Has fluctuated and at times worsened with LR or NS Component of SIADH?  I think we can improve on his chronic hyponatremia by decreasing free water he's getting per tube.  Family had been flushing with more free water - 150 before and after tube feeds (1200 cc free water per day) - than previously prescribed - 75 before and after tube feeds (600 cc free water per day) is what they said was recommended - which could certainly worsenen his hyponatremia.  I think decreasing the amount of free water he's receiving in flushes will help improve his hyponatremia (will plan to discharge with instructions to use 50 cc before and after tube feeds (400 cc free water per day)).  He's getting 375 cc QID isosource at home (  1.5 liters total volume).  Could also consider using gatorade or pedialyte instead of free water to flush (they'd increased his free water flushes due to concerns for hypotension sometimes in the morning - they give 12 oz gatorade Lee Barnes day in the morning with free water as well). Check orthostatics Possibly worsened in setting of his aspiration event, acute illness Gradually improving, will continue to monitor Normal TSH, cort stim wnl Further follow up outpatient  Unfortunately gtube dislodged today, as he's NPO, will provide 75 cc D5NS and follow sodium  closely.  History of oropharyngeal cancer- (present on admission) Noted, S/p treatment with cisplatin and radiation.  Last note from Dr. Bertis Ruddy in 2015, no evidence of recurrence from 03/21/2013 imaging study. G tube for feeding Follow with outpatient providers  Essential hypertension- (present on admission) BP fluctuating widely Reportedly not taking norvasc He's been started on this here, continue and follow - adjust BP meds as needed  Problem with gastrostomy tube (HCC) Dislodged when he went to bathroom Originally placed July 2022 IR to eval tomorrow If we need, can place small caliber foley to administer meds (but I think we can use IV meds for now)  DVT prophylaxis: lovenox Code Status: full Family Communication: wife at bedside Disposition:   Status is: Inpatient  Remains inpatient appropriate because: need for continued IV abx, low sodium       Consultants:  none  Procedures:  none  Antimicrobials:  Anti-infectives (From admission, onward)    Start     Dose/Rate Route Frequency Ordered Stop   12/02/21 1630  Ampicillin-Sulbactam (UNASYN) 3 g in sodium chloride 0.9 % 100 mL IVPB        3 g 200 mL/hr over 30 Minutes Intravenous Every 6 hours 12/02/21 1551     12/01/21 2200  amoxicillin-clavulanate (AUGMENTIN) 400-57 MG/5ML suspension 872 mg  Status:  Discontinued        875 mg of amoxicillin Per Tube Every 12 hours 12/01/21 2103 12/02/21 1544   11/30/21 0400  Ampicillin-Sulbactam (UNASYN) 3 g in sodium chloride 0.9 % 100 mL IVPB  Status:  Discontinued        3 g 200 mL/hr over 30 Minutes Intravenous Every 6 hours 11/30/21 0007 12/01/21 2103   11/30/21 0004  Ampicillin-Sulbactam (UNASYN) 3 g in sodium chloride 0.9 % 100 mL IVPB  Status:  Discontinued        3 g 200 mL/hr over 30 Minutes Intravenous Every 6 hours 11/30/21 0004 11/30/21 0006   11/29/21 2100  piperacillin-tazobactam (ZOSYN) IVPB 3.375 g        3.375 g 100 mL/hr over 30 Minutes Intravenous  Once  11/29/21 2053 11/29/21 2310       Subjective: No new complaints  Objective: Vitals:   12/01/21 2042 12/02/21 0526 12/02/21 1406 12/02/21 1431  BP:  120/83 135/88 101/68  Pulse: 90 70 82 85  Resp: 18 18 16 16   Temp: 99.7 F (37.6 C) 98.3 F (36.8 C) 98.7 F (37.1 C)   TempSrc:  Oral Oral   SpO2:  98% 99% 97%  Weight:        Intake/Output Summary (Last 24 hours) at 12/02/2021 1607 Last data filed at 12/02/2021 0900 Gross per 24 hour  Intake 900 ml  Output --  Net 900 ml   Filed Weights   11/30/21 0000 12/01/21 0546  Weight: 74.2 kg 74.8 kg    Examination:  General: No acute distress. Cardiovascular: RRR Lungs: unlabored Abdomen: Soft, nontender, nondistended - g tube  Neurological: Alert and oriented 3. Moves all extremities 4. Cranial nerves II through XII grossly intact. Skin: Warm and dry. No rashes or lesions. Extremities: No clubbing or cyanosis. No edema.  Data Reviewed: I have personally reviewed following labs and imaging studies  CBC: Recent Labs  Lab 11/29/21 2200 11/30/21 0402 12/01/21 0517 12/02/21 0416  WBC 6.7 9.7 6.7 5.6  NEUTROABS  --   --  5.1 4.2  HGB 13.8 11.8* 12.7* 10.6*  HCT 41.4 35.2* 38.6* 31.9*  MCV 84.8 85.2 86.4 86.0  PLT 210 189 200 193    Basic Metabolic Panel: Recent Labs  Lab 12/01/21 0517 12/01/21 1243 12/01/21 1958 12/02/21 0416 12/02/21 1428  NA 129* 126* 124* 127* 128*  K 4.7 4.4 4.2 4.5 4.3  CL 94* 94* 91* 95* 94*  CO2 27 27 25 26 27   GLUCOSE 80 109* 141* 136* 119*  BUN 23 21 20 20 23   CREATININE 0.76 0.80 0.76 0.73 0.76  CALCIUM 9.1 8.6* 8.7* 8.6* 8.7*  MG 2.1  --   --  2.2  --   PHOS 3.0  --   --  3.2  --     GFR: Estimated Creatinine Clearance: 90.9 mL/min (by C-G formula based on SCr of 0.76 mg/dL).  Liver Function Tests: Recent Labs  Lab 12/01/21 0517 12/02/21 0416  AST 26 22  ALT 30 25  ALKPHOS 92 76  BILITOT 0.8 0.5  PROT 7.6 6.6  ALBUMIN 3.9 3.4*    CBG: No results for input(s):  GLUCAP in the last 168 hours.   Recent Results (from the past 240 hour(s))  Culture, blood (Routine X 2) w Reflex to ID Panel     Status: None (Preliminary result)   Collection Time: 11/29/21 10:00 PM   Specimen: BLOOD  Result Value Ref Range Status   Specimen Description   Final    BLOOD BLOOD RIGHT FOREARM Performed at [facility], [address], [place], [place] [address]    Special Requests   Final    BOTTLES DRAWN AEROBIC AND ANAEROBIC Blood Culture adequate volume Performed at [facility], [address] [place], [place] [address]    Culture   Final    NO GROWTH 2 DAYS Performed at [facility], [address] [place], [place] [address]    Report Status PENDING  Incomplete  Resp Panel by RT-PCR (Flu Emily Forse&B, Covid) Nasopharyngeal Swab     Status: None   Collection Time: 11/29/21 10:10 PM   Specimen: Nasopharyngeal Swab; Nasopharyngeal(NP) swabs in vial transport medium  Result Value Ref Range Status   SARS Coronavirus 2 by RT PCR NEGATIVE NEGATIVE Final    Comment: (NOTE) SARS-CoV-2 target nucleic acids are NOT DETECTED.  The SARS-CoV-2 RNA is generally detectable in upper respiratory specimens during the acute phase of infection. The lowest concentration of SARS-CoV-2 viral copies this assay can detect is 138 copies/mL. Kisa Fujii negative result does not preclude SARS-Cov-2 infection and should not be used as the sole basis for treatment or other patient management decisions. Courtenay Hirth negative result may occur with  improper specimen collection/handling, submission of specimen other than nasopharyngeal swab, presence of viral mutation(s) within the areas targeted by this assay, and inadequate number of viral copies(<138 copies/mL). Demtrius Rounds negative result must be combined with clinical observations, patient history, and epidemiological information. The expected result is Negative.  Fact Sheet for Patients:   BloggerCourse.com  Fact Sheet for Healthcare Providers:  SeriousBroker.it  This test is  no t yet approved or cleared by the [place] and  has been authorized for detection and/or diagnosis of SARS-CoV-2 by FDA under an Emergency Use Authorization (EUA). This EUA will remain  in effect (meaning this test can be used) for the duration of the COVID-19 declaration under Section 564(b)(1) of the Act, 21 U.S.C.section 360bbb-3(b)(1), unless the authorization is terminated  or revoked sooner.       Influenza Hendrick Pavich by PCR NEGATIVE NEGATIVE Final   Influenza B by PCR NEGATIVE NEGATIVE Final    Comment: (NOTE) The Xpert Xpress SARS-CoV-2/FLU/RSV plus assay is intended as an aid in the diagnosis of influenza from Nasopharyngeal swab specimens and should not be used as Markeshia Giebel sole basis for treatment. Nasal washings and aspirates are unacceptable for Xpert Xpress SARS-CoV-2/FLU/RSV testing.  Fact Sheet for Patients: BloggerCourse.com  Fact Sheet for Healthcare Providers: SeriousBroker.it  This test is not yet approved or cleared by the [place] FDA and has been authorized for detection and/or diagnosis of SARS-CoV-2 by FDA under an Emergency Use Authorization (EUA). This EUA will remain in effect (meaning this test can be used) for the duration of the COVID-19 declaration under Section 564(b)(1) of the Act, 21 U.S.C. section 360bbb-3(b)(1), unless the authorization is terminated or revoked.  Performed at [facility], [address] [place], [place] [address]   Culture, blood (Routine X 2) w Reflex to ID Panel     Status: None (Preliminary result)   Collection Time: 11/29/21 10:10 PM   Specimen: BLOOD  Result Value Ref Range Status   Specimen Description   Final    BLOOD RIGHT HAND Performed at [facility], [address],  [place], [place] [address]    Special Requests   Final    BOTTLES DRAWN AEROBIC AND ANAEROBIC Blood Culture adequate volume Performed at [facility], [address] [place], [place] [address]    Culture   Final    NO GROWTH 2 DAYS Performed at [facility], [address] [place], [place] [address]    Report Status PENDING  Incomplete         Radiology Studies: No results found.      Scheduled Meds:  amLODipine  5 mg Oral Daily   enoxaparin (LOVENOX) injection  40 mg Subcutaneous Q24H   Isosource 1.5 Cal  375 mL Per Tube QID   Continuous Infusions:  ampicillin-sulbactam (UNASYN) IV     dextrose 5 % and 0.9% NaCl       LOS: 3 days    Time spent: over 30 min    Lacretia Nicks, MD [facility]   To contact the attending provider between 7A-7P or the covering provider during after hours 7P-7A, please log into the web site [email or web address] and access using universal [place] password for that web site. If you do not have the password, please call the hospital operator.  12/02/2021, 4:07 PM

## 2021-12-03 ENCOUNTER — Inpatient Hospital Stay (HOSPITAL_COMMUNITY): Payer: Medicare Other

## 2021-12-03 HISTORY — PX: IR REPLC GASTRO/COLONIC TUBE PERCUT W/FLUORO: IMG2333

## 2021-12-03 LAB — CBC WITH DIFFERENTIAL/PLATELET
Abs Immature Granulocytes: 0.02 10*3/uL (ref 0.00–0.07)
Basophils Absolute: 0 10*3/uL (ref 0.0–0.1)
Basophils Relative: 0 %
Eosinophils Absolute: 0.1 10*3/uL (ref 0.0–0.5)
Eosinophils Relative: 3 %
HCT: 35.3 % — ABNORMAL LOW (ref 39.0–52.0)
Hemoglobin: 11.8 g/dL — ABNORMAL LOW (ref 13.0–17.0)
Immature Granulocytes: 0 %
Lymphocytes Relative: 11 %
Lymphs Abs: 0.5 10*3/uL — ABNORMAL LOW (ref 0.7–4.0)
MCH: 28.9 pg (ref 26.0–34.0)
MCHC: 33.4 g/dL (ref 30.0–36.0)
MCV: 86.5 fL (ref 80.0–100.0)
Monocytes Absolute: 0.9 10*3/uL (ref 0.1–1.0)
Monocytes Relative: 20 %
Neutro Abs: 3 10*3/uL (ref 1.7–7.7)
Neutrophils Relative %: 66 %
Platelets: 196 10*3/uL (ref 150–400)
RBC: 4.08 MIL/uL — ABNORMAL LOW (ref 4.22–5.81)
RDW: 15.4 % (ref 11.5–15.5)
WBC: 4.5 10*3/uL (ref 4.0–10.5)
nRBC: 0 % (ref 0.0–0.2)

## 2021-12-03 LAB — COMPREHENSIVE METABOLIC PANEL
ALT: 26 U/L (ref 0–44)
AST: 26 U/L (ref 15–41)
Albumin: 3.7 g/dL (ref 3.5–5.0)
Alkaline Phosphatase: 78 U/L (ref 38–126)
Anion gap: 7 (ref 5–15)
BUN: 17 mg/dL (ref 8–23)
CO2: 25 mmol/L (ref 22–32)
Calcium: 8.8 mg/dL — ABNORMAL LOW (ref 8.9–10.3)
Chloride: 98 mmol/L (ref 98–111)
Creatinine, Ser: 0.74 mg/dL (ref 0.61–1.24)
GFR, Estimated: >60 mL/min (ref >60)
Glucose, Bld: 94 mg/dL (ref 70–99)
Potassium: 4.4 mmol/L (ref 3.5–5.1)
Sodium: 130 mmol/L — ABNORMAL LOW (ref 135–145)
Total Bilirubin: 0.7 mg/dL (ref 0.3–1.2)
Total Protein: 7.3 g/dL (ref 6.5–8.1)

## 2021-12-03 LAB — BASIC METABOLIC PANEL
Anion gap: 10 (ref 5–15)
BUN: 22 mg/dL (ref 8–23)
CO2: 25 mmol/L (ref 22–32)
Calcium: 9.1 mg/dL (ref 8.9–10.3)
Chloride: 94 mmol/L — ABNORMAL LOW (ref 98–111)
Creatinine, Ser: 0.86 mg/dL (ref 0.61–1.24)
GFR, Estimated: >60 mL/min (ref >60)
Glucose, Bld: 87 mg/dL (ref 70–99)
Potassium: 5 mmol/L (ref 3.5–5.1)
Sodium: 129 mmol/L — ABNORMAL LOW (ref 135–145)

## 2021-12-03 LAB — MAGNESIUM: Magnesium: 2.1 mg/dL (ref 1.7–2.4)

## 2021-12-03 LAB — PHOSPHORUS: Phosphorus: 2.7 mg/dL (ref 2.5–4.6)

## 2021-12-03 MED ORDER — LIDOCAINE VISCOUS HCL 2 % MT SOLN
OROMUCOSAL | Status: AC
  Filled 2021-12-03: qty 15

## 2021-12-03 MED ORDER — AMOXICILLIN-POT CLAVULANATE 250-62.5 MG/5ML PO SUSR: 875.0000 mg | Freq: Two times a day (BID) | ORAL | 0 refills | Status: AC

## 2021-12-03 MED ORDER — LIDOCAINE HCL 1 % IJ SOLN
INTRAMUSCULAR | Status: AC
  Filled 2021-12-03: qty 20

## 2021-12-03 MED ORDER — ISOSOURCE 1.5 CAL PO LIQD: 375.0000 mL | Freq: Four times a day (QID) | ORAL | 0 refills | Status: DC

## 2021-12-03 MED ORDER — IOHEXOL 300 MG/ML  SOLN
50.0000 mL | Freq: Once | INTRAMUSCULAR | Status: AC | PRN
Start: 2021-12-03 — End: 2021-12-03
  Administered 2021-12-03: 12 mL

## 2021-12-03 NOTE — Consult Note (Signed)
Chief Complaint: Patient was seen in consultation today for dislodged G-tube  Supervising Physician: Jacqulynn Cadet  Patient Status: [facility] - In-pt  History of Present Illness: Lee Barnes is a 71 y.o. male admitted with aspiration pneumonia and history of oropharyngeal cancer.  He is G-tube dependent with new placement of tube approximately 6 months ago.  He reports the tube was accidentally pulled out when getting caught on clothing.   Past Medical History:  Diagnosis Date   Allergy to environmental factors    Complication of anesthesia    "vageled" after surgery -overnite stay   Diverticulosis 2008   Heart murmur    hx of in childhood    History of chemotherapy    History of radiation therapy 11/05/10-12/26/10   r base tongue, 7000 cGy 35 sessions   Hypertension    Medication started in May 2016.   Oropharynx cancer (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   Tinnitus     Past Surgical History:  Procedure Laterality Date   APPENDECTOMY     COLONOSCOPY     INGUINAL HERNIA REPAIR Bilateral 03/30/2015   Procedure: LAPAROSCOPIC BILATERAL INGUINAL HERNIA REPAIR WITH MESH;  Surgeon: Coralie Keens, MD;  Location: [facility];  Service: General;  Laterality: Bilateral;   INGUINAL HERNIA REPAIR Right Kulm Bilateral 03/30/2015   INGUINAL HERNIA REPAIR Left 07/11/2015   INGUINAL HERNIA REPAIR N/A 07/11/2015   Procedure: REPAIR OF LEFT INGUINAL HERNIA WITH MESH;  Surgeon: Coralie Keens, MD;  Location: [facility];  Service: General;  Laterality: N/A;   INSERTION OF MESH N/A 07/11/2015   Procedure: INSERTION OF MESH;  Surgeon: Coralie Keens, MD;  Location: [facility];  Service: General;  Laterality: N/A;   LUMBAR DISC SURGERY     TONSILLECTOMY      Allergies: Codeine  Medications: Prior to Admission medications   Medication Sig Start Date End Date Taking? Authorizing Provider  cetirizine (ZYRTEC) 10 MG  chewable tablet Chew 10 mg by mouth at bedtime.   Yes [provider]  fluticasone (FLONASE) 50 MCG/ACT nasal spray SPRAY TWO SPRAYS IN EACH NOSTRIL ONCE DAILY AS NEEDED Patient taking differently: Place 2 sprays into both nostrils at bedtime. 10/29/21  Yes Denita Lung, MD  hydrocortisone cream 1 % Apply 1 application topically 2 (two) times daily as needed for itching.   Yes [provider]  nystatin (MYCOSTATIN) 100000 UNIT/ML suspension Use as directed 5 mLs in the mouth or throat in the morning, at noon, in the evening, and at bedtime. For 14 days 11/28/21 12/12/21 Yes [provider]  amLODipine (NORVASC) 5 MG tablet Take 1 tablet (5 mg total) by mouth every evening. Patient not taking: Reported on 11/30/2021 02/28/21   Denita Lung, MD     Family History  Problem Relation Age of Onset   Heart disease Father    Cancer Father        throat ca   Heart disease Mother    Colon cancer Neg Hx     Social History   Socioeconomic History   Marital status: Married    Spouse name: Not on file   Number of children: 3   Years of education: Not on file   Highest education level: Not on file  Occupational History   Occupation: OWNER    Employer: [employer]  Tobacco Use   Smoking status:  Never   Smokeless tobacco: Never  Substance and Sexual Activity   Alcohol use: Yes    Comment: occas drinks a beer every few weeks    Drug use: No    Comment:     Sexual activity: Yes  Other Topics Concern   Not on file  Social History Narrative   Not on file   Social Determinants of Health   Financial Resource Strain: Not on file  Food Insecurity: Not on file  Transportation Needs: Not on file  Physical Activity: Not on file  Stress: Not on file  Social Connections: Not on file    Review of Systems: not performed  Vital Signs: BP (!) 189/119 (BP Location: Left Arm)    Pulse 86    Temp 97.7 F (36.5 C)    Resp 16    Wt 164 lb 14.5 oz (74.8 kg)    SpO2  97%    BMI 22.37 kg/m   Exam: Insertion site/tract for G-tube appears established, but without much scar tissue.  Probing in tract allowed access to gastric cavity and led to small amount of trauma/bleeding to tract wall.  Previous pull-through tube was examined and deemed to be intact.    Imaging: DG Chest Portable 1 View  Result Date: 11/29/2021 CLINICAL DATA:  Possible pneumonia. EXAM: PORTABLE CHEST 1 VIEW COMPARISON:  Chest radiograph dated 04/26/2021. FINDINGS: There is eventration of the right hemidiaphragm. Right lung base density, likely atelectasis. Pneumonia is not excluded. The left lung is clear. No pneumothorax. No pleural effusion. Mild cardiomegaly. No acute osseous pathology. IMPRESSION: 1. Right lung base atelectasis versus infiltrate. 2. Mild cardiomegaly. Electronically Signed   By: Anner Crete M.D.   On: 11/29/2021 20:38    Labs:  CBC: Recent Labs    11/30/21 0402 12/01/21 0517 12/02/21 0416 12/03/21 0358  WBC 9.7 6.7 5.6 4.5  HGB 11.8* 12.7* 10.6* 11.8*  HCT 35.2* 38.6* 31.9* 35.3*  PLT 189 200 193 196    COAGS: Recent Labs    02/20/21 1746  INR 1.0    BMP: Recent Labs    12/11/20 1235 12/11/20 1830 12/18/20 1529 02/20/21 1746 12/02/21 0416 12/02/21 1428 12/02/21 2007 12/03/21 0358  NA 116*   < > 132*   < > 127* 128* 129* 130*  K 4.5   < > 4.4   < > 4.5 4.3 5.0 4.4  CL 81*   < > 95*   < > 95* 94* 94* 98  CO2 26   < > 24   < > 26 27 25 25   GLUCOSE 108*   < > 96   < > 136* 119* 87 94  BUN 11   < > 17   < > 20 23 22 17   CALCIUM 9.3   < > 9.5   < > 8.6* 8.7* 9.1 8.8*  CREATININE 0.68*   < > 1.17   < > 0.73 0.76 0.86 0.74  GFRNONAA 97   < > 63   < > >60 >60 >60 >60  GFRAA 113  --  73  --   --   --   --   --    < > = values in this interval not displayed.    LIVER FUNCTION TESTS: Recent Labs    09/25/21 1050 12/01/21 0517 12/02/21 0416 12/03/21 0358  BILITOT 0.4 0.8 0.5 0.7  AST 20 26 22 26   ALT 28 30 25 26   ALKPHOS 108 92 76 78   PROT  6.9 7.6 6.6 7.3  ALBUMIN 4.0 3.9 3.4* 3.7     Assessment and Plan:  Dislodged G-tube --placed foley through tract bedside --will plan to see in IR for replacement G-tube --discussed plan of care with patient, patient's wife, and RN  Thank you for this interesting consult.  I greatly enjoyed meeting Lee Barnes and look forward to participating in their care.     Electronically Signed: Pasty Spillers, PA 12/03/2021, 3:14 PM   I spent a total of 40 Minutes in face to face in clinical consultation, greater than 50% of which was counseling/coordinating care for G-tube replacement

## 2021-12-03 NOTE — Progress Notes (Deleted)
Transition of Care [facility]) Screening Note  Patient Details  Name: Lee Barnes Date of Birth: 03/26/1951  Transition of [facility]) CM/SW Contact:    Sherie Don, LCSW Phone Number: 12/03/2021, 10:35 AM  Transition of Care Department [facility]) has reviewed patient and no TOC needs have been identified at this time. We will continue to monitor patient advancement through interdisciplinary progression rounds. If new patient transition needs arise, please place a TOC consult.

## 2021-12-03 NOTE — Progress Notes (Signed)
Pt alert and oriented. D/C instructions given, pt d/cd to home. 

## 2021-12-03 NOTE — Discharge Summary (Addendum)
Physician Discharge Summary  Lee Barnes ZOX:[id number] DOB: 1950/12/25 DOA: 11/29/2021  PCP: Lee Barnes  Admit date: 11/29/2021 Discharge date: 12/03/2021  Time spent: 40 minutes  Recommendations for Outpatient Follow-up:  Follow outpatient CBC/CMP Follow sodium levels outpatient - of note, decreased free water flushes to 50 cc before and after tube feeds -> would adjust free water as needed to work on sodium.  Could consider use of gatorade or pedialyte to flush in order to reduce the amount of free water?  Follow outpatient and adjust further as needed. (See below for additional thoughts) Follow with PCP/SLP regarding NPO status and future pleasure feeds  Not taking amlodipine consistently - follow BP outpatient for further adjustment to regimen as needed (positive orthostatics by vitals, but asymptomatic)  Discharge Diagnoses:  Principal Problem:   Aspiration pneumonia (HCC) Active Problems:   Chronic hyponatremia   History of oropharyngeal cancer   Essential hypertension   Problem with gastrostomy tube Lee Barnes)   Discharge Condition: stable  Diet recommendation: NPO  Filed Weights   11/30/21 0000 12/01/21 0546  Weight: 74.2 kg 74.8 kg    History of present illness:  Lee Barnes is Lee Barnes 71 y.o. male with medical history significant for hx of oaropharyngeal cancer s/p treatment, has G tube for feeding, HTN, aspiration pneumonia who presents with cough and shortness of breath for the last few days.  He was found to have aspiration pneumonia.  He's improved with antibiotics.  See below for additional details  Hospital Course:  * Aspiration pneumonia (HCC)- (present on admission) Presented with generalized weakness, fever CXR with right lung base infiltrate Continue NPO and G tube feeds At home, he eats infrequently, only occasionally for pleasure -> will have SLP reeval -> no new recommendations, follow outpatient, continue NPO until follow up with outpatient  providers Complete abx with augmentin Blood cx no growth  Chronic hyponatremia- (present on admission) Acute on chronic Appears euvolemic again on my exam today Has fluctuated and at times worsened with LR or NS Component of SIADH?  I think we can improve on his chronic hyponatremia by decreasing free water he's getting per tube.  Family had been flushing with more free water - 150 before and after tube feeds (1200 cc free water per day) - than previously prescribed - 75 before and after tube feeds (600 cc free water per day) is what they said was recommended - which could certainly worsenen his hyponatremia.  I think decreasing the amount of free water he's receiving in flushes will help improve his hyponatremia (will plan to discharge with instructions to use 50 cc before and after tube feeds (400 cc free water per day)).  He's getting 375 cc QID isosource at home (1.5 liters total volume).  Could also consider using gatorade or pedialyte instead of free water to flush (they'd increased his free water flushes due to concerns for hypotension sometimes in the morning - they give 12 oz gatorade Lee Barnes day in the morning with free water as well). Check orthostatics -> positive, but no symptoms - follow outpatient (hold amlodipine for now, follow with PCP) Possibly worsened in setting of his aspiration event, acute illness Gradually improving, will continue to monitor Normal TSH, cort stim wnl Further follow up outpatient  History of oropharyngeal cancer- (present on admission) Noted, S/p treatment with cisplatin and radiation.  Last note from Dr. Bertis Barnes in 2015, no evidence of recurrence from 03/21/2013 imaging study. G tube for feeding Follow with outpatient providers  Essential hypertension- (present on admission) BP fluctuating widely Reportedly not taking norvasc He's been started on this here, continue and follow - adjust BP meds as needed outpatient - positive orthostatics today, hold  amlodipine - follow BP outpatient  Problem with gastrostomy tube (HCC) Dislodged when he went to bathroom Originally placed July 2022 IR to eval tomorrow -> replaced today, 1/16    Procedures: IR replace G tube  Consultations: IR  Discharge Exam: Vitals:   12/03/21 1710 12/03/21 1714  BP: 114/73 (!) 160/98  Pulse: 99 98  Resp: 18 16  Temp: 97.8 F (36.6 C) 98 F (36.7 C)  SpO2: 98% 97%   No complaints  General: No acute distress. Cardiovascular: RRR Lungs: unlabored Abdomen: Soft, nontender, nondistended - foley noted in place of peg prior to replacement Neurological: Alert and oriented 3. Moves all extremities 4 . Cranial nerves II through XII grossly intact. Skin: Warm and dry. No rashes or lesions. Extremities: No clubbing or cyanosis. No edema.  Discharge Instructions   Discharge Instructions     Call Barnes for:  difficulty breathing, headache or visual disturbances   Complete by: As directed    Call Barnes for:  extreme fatigue   Complete by: As directed    Call Barnes for:  hives   Complete by: As directed    Call Barnes for:  persistant dizziness or light-headedness   Complete by: As directed    Call Barnes for:  persistant nausea and vomiting   Complete by: As directed    Call Barnes for:  redness, tenderness, or signs of infection (pain, swelling, redness, odor or green/yellow discharge around incision site)   Complete by: As directed    Call Barnes for:  severe uncontrolled pain   Complete by: As directed    Call Barnes for:  temperature >100.4   Complete by: As directed    Diet NPO time specified   Complete by: As directed    Discharge instructions   Complete by: As directed    You were seen for aspiration pneumonia.  Follow up with your PCP outpatient to follow this up.  You should follow up with speech outpatient as well.  Continue your NPO status for now until you follow with your outpatient providers.   Continue your tube feeds, but let's cut back on your free water.   Use 50 cc before and after tube feeds instead of Vianney Kopecky larger volume.  Follow up in 2-3 days for [place] repeat sodium.    Return for new, recurrent, or worsening symptoms.  Please ask your PCP to request records from this hospitalization so they know what was done and what the next steps will be.   Increase activity slowly   Complete by: As directed       Allergies as of 12/03/2021       Reactions   Codeine Nausea And Vomiting        Medication List     TAKE these medications    amLODipine 5 MG tablet Commonly known as: NORVASC Take 1 tablet (5 mg total) by mouth every evening.   amoxicillin-clavulanate 250-62.5 MG/5ML suspension Commonly known as: AUGMENTIN Take 17.5 mLs (875 mg total) by mouth 2 (two) times daily for 1 day.   cetirizine 10 MG chewable tablet Commonly known as: ZYRTEC Chew 10 mg by mouth at bedtime.   fluticasone 50 MCG/ACT nasal spray Commonly known as: FLONASE SPRAY TWO SPRAYS IN EACH NOSTRIL ONCE DAILY AS NEEDED What changed:  how  much to take how to take this when to take this additional instructions   hydrocortisone cream 1 % Apply 1 application topically 2 (two) times daily as needed for itching.   Isosource 1.5 Cal Liqd Place 375 mLs into feeding tube 4 (four) times daily. Flush with 50 mL free water before and after tube feeds.  Follow with PCP and nephrology to consider adjustment of free water as needed based on sodium.   nystatin 100000 UNIT/ML suspension Commonly known as: MYCOSTATIN Use as directed 5 mLs in the mouth or throat in the morning, at noon, in the evening, and at bedtime. For 14 days       Allergies  Allergen Reactions   Codeine Nausea And Vomiting      The results of significant diagnostics from this hospitalization (including imaging, microbiology, ancillary and laboratory) are listed below for reference.    Significant Diagnostic Studies: IR Replc Gastro/Colonic Tube Percut W/Fluoro  Result Date:  12/03/2021 INDICATION: Patient with history of oropharyngeal cancer with dysphagia. Request is for gastrostomy tube replacement. EXAM: PUSH GASTROSTOMY TUBE PLACEMENT COMPARISON:  None MEDICATIONS: No; Antibiotics were administered within 1 hour of the procedure. CONTRAST:  5 mL of Isovue 300 administered into the gastric lumen. ANESTHESIA/SEDATION: Moderate (conscious) sedation was not employed during this procedure FLUOROSCOPY TIME:  0 minutes 46 seconds (4 mGy) COMPLICATIONS: None immediate. PROCEDURE: Informed written consent was obtained from the patient's family following explanation of the procedure, risks, benefits and alternatives. Elvina Bosch time out was performed prior to the initiation of the procedure. Ultrasound scanning was performed to demarcate the edge of the left lobe of the liver. Maximal barrier sterile technique utilized including caps, mask, sterile gowns, sterile gloves, large sterile drape, hand hygiene and Betadine prep. The right upper quadrant was sterilely prepped and draped. Under intermittent fluoroscopic guidance, the place holder catheter was exchanged for Mirza Kidney Amplatz, ultimately allowing placement of Shona Pardo 20-French balloon retention gastrostomy tube. The retention balloon was insufflated with Caitlin Hillmer mixture of dilute saline and pulled taut against the anterior wall of the stomach. The external disc was cinched. Contrast injection confirms positioning within the stomach. Spot radiographic images confirmed placement within the stomach. The patient tolerated procedure well without immediate post procedural complication. FINDINGS: After successful fluoroscopic guided placement, the gastrostomy tube is appropriately positioned with internal retention balloon against the ventral aspect of the gastric lumen. IMPRESSION: Successful fluoroscopic insertion of an 69 French balloon retention gastrostomy tube. The gastrostomy tube is available to use at this time. Electronically Signed   By: Malachy Moan M.D.    On: 12/03/2021 16:51   DG Chest Portable 1 View  Result Date: 11/29/2021 CLINICAL DATA:  Possible pneumonia. EXAM: PORTABLE CHEST 1 VIEW COMPARISON:  Chest radiograph dated 04/26/2021. FINDINGS: There is eventration of the right hemidiaphragm. Right lung base density, likely atelectasis. Pneumonia is not excluded. The left lung is clear. No pneumothorax. No pleural effusion. Mild cardiomegaly. No acute osseous pathology. IMPRESSION: 1. Right lung base atelectasis versus infiltrate. 2. Mild cardiomegaly. Electronically Signed   By: Elgie Collard M.D.   On: 11/29/2021 20:38    Microbiology: Recent Results (from the past 240 hour(s))  Culture, blood (Routine X 2) w Reflex to ID Panel     Status: None (Preliminary result)   Collection Time: 11/29/21 10:00 PM   Specimen: BLOOD  Result Value Ref Range Status   Specimen Description   Final    BLOOD BLOOD RIGHT FOREARM Performed at [facility], [address], [place] [address]    Special Requests   Final    BOTTLES DRAWN AEROBIC AND ANAEROBIC Blood Culture adequate volume Performed at [facility], [address] [place], [place] [address]    Culture   Final    NO GROWTH 3 DAYS Performed at [facility], [address] [place], [place] [address]    Report Status PENDING  Incomplete  Resp Panel by RT-PCR (Flu Elohim Brune&B, Covid) Nasopharyngeal Swab     Status: None   Collection Time: 11/29/21 10:10 PM   Specimen: Nasopharyngeal Swab; Nasopharyngeal(NP) swabs in vial transport medium  Result Value Ref Range Status   SARS Coronavirus 2 by RT PCR NEGATIVE NEGATIVE Final    Comment: (NOTE) SARS-CoV-2 target nucleic acids are NOT DETECTED.  The SARS-CoV-2 RNA is generally detectable in upper respiratory specimens during the acute phase of infection. The lowest concentration of SARS-CoV-2 viral copies this assay can detect is 138 copies/mL. Lagena Strand negative result does not preclude  SARS-Cov-2 infection and should not be used as the sole basis for treatment or other patient management decisions. Malanie Koloski negative result may occur with  improper specimen collection/handling, submission of specimen other than nasopharyngeal swab, presence of viral mutation(s) within the areas targeted by this assay, and inadequate number of viral copies(<138 copies/mL). Geanette Buonocore negative result must be combined with clinical observations, patient history, and epidemiological information. The expected result is Negative.  Fact Sheet for Patients:  BloggerCourse.com  Fact Sheet for Healthcare Providers:  SeriousBroker.it  This test is no t yet approved or cleared by the [place] FDA and  has been authorized for detection and/or diagnosis of SARS-CoV-2 by FDA under an Emergency Use Authorization (EUA). This EUA will remain  in effect (meaning this test can be used) for the duration of the COVID-19 declaration under Section 564(b)(1) of the Act, 21 U.S.C.section 360bbb-3(b)(1), unless the authorization is terminated  or revoked sooner.       Influenza Kara Melching by PCR NEGATIVE NEGATIVE Final   Influenza B by PCR NEGATIVE NEGATIVE Final    Comment: (NOTE) The Xpert Xpress SARS-CoV-2/FLU/RSV plus assay is intended as an aid in the diagnosis of influenza from Nasopharyngeal swab specimens and should not be used as Lashayla Armes sole basis for treatment. Nasal washings and aspirates are unacceptable for Xpert Xpress SARS-CoV-2/FLU/RSV testing.  Fact Sheet for Patients: BloggerCourse.com  Fact Sheet for Healthcare Providers: SeriousBroker.it  This test is not yet approved or cleared by the [place] FDA and has been authorized for detection and/or diagnosis of SARS-CoV-2 by FDA under an Emergency Use Authorization (EUA). This EUA will remain in effect (meaning this test can be used) for the duration of  the COVID-19 declaration under Section 564(b)(1) of the Act, 21 U.S.C. section 360bbb-3(b)(1), unless the authorization is terminated or revoked.  Performed at [facility], [address] [place], [place] [address]   Culture, blood (Routine X 2) w Reflex to ID Panel     Status: None (Preliminary result)   Collection Time: 11/29/21 10:10 PM   Specimen: BLOOD  Result Value Ref Range Status   Specimen Description   Final    BLOOD RIGHT HAND Performed at [facility], [address], [place], [place] [address]    Special Requests   Final    BOTTLES DRAWN AEROBIC AND ANAEROBIC Blood Culture adequate volume Performed at [facility], [address], [place], [place] [address]    Culture   Final  NO GROWTH 3 DAYS Performed at [facility], [address], [place], [place] [address]    Report Status PENDING  Incomplete     Labs: Basic Metabolic Panel: Recent Labs  Lab 12/01/21 0517 12/01/21 1243 12/01/21 1958 12/02/21 0416 12/02/21 1428 12/02/21 2007 12/03/21 0358  NA 129*   < > 124* 127* 128* 129* 130*  K 4.7   < > 4.2 4.5 4.3 5.0 4.4  CL 94*   < > 91* 95* 94* 94* 98  CO2 27   < > 25 26 27 25 25   GLUCOSE 80   < > 141* 136* 119* 87 94  BUN 23   < > 20 20 23 22 17   CREATININE 0.76   < > 0.76 0.73 0.76 0.86 0.74  CALCIUM 9.1   < > 8.7* 8.6* 8.7* 9.1 8.8*  MG 2.1  --   --  2.2  --   --  2.1  PHOS 3.0  --   --  3.2  --   --  2.7   < > = values in this interval not displayed.   Liver Function Tests: Recent Labs  Lab 12/01/21 0517 12/02/21 0416 12/03/21 0358  AST 26 22 26   ALT 30 25 26   ALKPHOS 92 76 78  BILITOT 0.8 0.5 0.7  PROT 7.6 6.6 7.3  ALBUMIN 3.9 3.4* 3.7   No results for input(s): LIPASE, AMYLASE in the last 168 hours. No results for input(s): AMMONIA in the last 168 hours. CBC: Recent Labs  Lab 11/29/21 2200 11/30/21 0402 12/01/21 0517 12/02/21 0416 12/03/21 0358  WBC 6.7 9.7 6.7  5.6 4.5  NEUTROABS  --   --  5.1 4.2 3.0  HGB 13.8 11.8* 12.7* 10.6* 11.8*  HCT 41.4 35.2* 38.6* 31.9* 35.3*  MCV 84.8 85.2 86.4 86.0 86.5  PLT 210 189 200 193 196   Cardiac Enzymes: No results for input(s): CKTOTAL, CKMB, CKMBINDEX, TROPONINI in the last 168 hours. BNP: BNP (last 3 results) Recent Labs    12/12/20 1645  BNP 17.8    ProBNP (last 3 results) No results for input(s): PROBNP in the last 8760 hours.  CBG: No results for input(s): GLUCAP in the last 168 hours.     Signed:  Lacretia Nicks Barnes.  [facility] 12/03/2021, 5:24 PM

## 2021-12-04 ENCOUNTER — Encounter: Payer: Self-pay | Admitting: Family Medicine

## 2021-12-04 ENCOUNTER — Telehealth: Payer: Self-pay

## 2021-12-04 NOTE — Telephone Encounter (Signed)
Per my TOC report Lee Barnes was recently in the hospital for Aspiration pneumonia. It did not say if he needed to f/u here or not. If you could review his ED note and see if you need him to f/u here or not.

## 2021-12-05 ENCOUNTER — Ambulatory Visit: Payer: Self-pay

## 2021-12-05 ENCOUNTER — Telehealth: Payer: Self-pay | Admitting: Pharmacist

## 2021-12-05 ENCOUNTER — Encounter: Payer: Self-pay | Admitting: Family Medicine

## 2021-12-05 ENCOUNTER — Other Ambulatory Visit: Payer: Self-pay

## 2021-12-05 ENCOUNTER — Ambulatory Visit (INDEPENDENT_AMBULATORY_CARE_PROVIDER_SITE_OTHER): Payer: Medicare Other | Admitting: Family Medicine

## 2021-12-05 VITALS — BP 144/90 | HR 92 | Temp 98.1°F | Wt 161.8 lb

## 2021-12-05 DIAGNOSIS — Z85819 Personal history of malignant neoplasm of unspecified site of lip, oral cavity, and pharynx: Secondary | ICD-10-CM

## 2021-12-05 DIAGNOSIS — Z8701 Personal history of pneumonia (recurrent): Secondary | ICD-10-CM | POA: Diagnosis not present

## 2021-12-05 DIAGNOSIS — I10 Essential (primary) hypertension: Secondary | ICD-10-CM

## 2021-12-05 LAB — CULTURE, BLOOD (ROUTINE X 2)
Culture: NO GROWTH
Culture: NO GROWTH
Special Requests: ADEQUATE
Special Requests: ADEQUATE

## 2021-12-05 NOTE — Chronic Care Management (AMB) (Signed)
12/05/2021  Lee Barnes 11-09-1951 [id number]   In collaboration with embedded care management team I reached out to the patients primary care provider Dr. Sharlot Gowda to discuss patient care needs. At this time the patients primary care provider does not feel a referral to the care management team is appropriate.  SW to perform a program closure with option for provider to place a future referral if warranted.  Bevelyn Ngo, BSW, CDP Social Worker, Certified Dementia Practitioner [facility] Care Management [phone]

## 2021-12-05 NOTE — Chronic Care Management (AMB) (Signed)
Chronic Care Management Pharmacy Assistant   Name: Lee Barnes  MRN: [id number] DOB: 1951/04/26  Reason for Encounter: [name] to wife in regards to appointment with Gaylord Shih Clinical Pharmacist she reports she does not think they will benefit from the appointment as he is not managing any chronic conditions with medications, advised that there is available nursing services with CCM that she may like to take advantage of instead and she reports she will follow up with PCP on today and discuss with him but is not interested at this time. Appt cancelled   Medications: Outpatient Encounter Medications as of 12/05/2021  Medication Sig Note   amLODipine (NORVASC) 5 MG tablet Take 1 tablet (5 mg total) by mouth every evening. (Patient not taking: Reported on 11/30/2021)    cetirizine (ZYRTEC) 10 MG chewable tablet Chew 10 mg by mouth at bedtime.    fluticasone (FLONASE) 50 MCG/ACT nasal spray SPRAY TWO SPRAYS IN EACH NOSTRIL ONCE DAILY AS NEEDED (Patient taking differently: Place 2 sprays into both nostrils at bedtime.)    hydrocortisone cream 1 % Apply 1 application topically 2 (two) times daily as needed for itching.    Nutritional Supplements (ISOSOURCE 1.5 CAL) LIQD Place 375 mLs into feeding tube 4 (four) times daily. Flush with 50 mL free water before and after tube feeds.  Follow with PCP and nephrology to consider adjustment of free water as needed based on sodium.    nystatin (MYCOSTATIN) 100000 UNIT/ML suspension Use as directed 5 mLs in the mouth or throat in the morning, at noon, in the evening, and at bedtime. For 14 days 11/30/2021: Has not started yet   No facility-administered encounter medications on file as of 12/05/2021.  Fill History  : AMLODIPINE BESYLATE 5MG  TAB 06/23/2021 90   FLUTICASONE SPR 11/15/2021 90   NYSTATIN 100000 U SUS 11/28/2021 14   Care Gaps: Zoster Vaccine - Overdue PNA Vaccine - Overdue COVID Booster - Overdue BP- 114/73 (12/03/21)  Star  Rating Drugs: None     Pamala Duffel CMA Clinical Pharmacist Assistant [phone]

## 2021-12-05 NOTE — Progress Notes (Signed)
Subjective:    Patient ID: Lee Barnes, male    DOB: 16-Nov-1951, 71 y.o.   MRN: [id number]  HPI He is here for posthospitalization follow-up.  He was admitted January 12 and sent home on the 16th.  He did have another episode of aspiration pneumonia after doing some "pleasure eating" he has been working closely with speech pathology as well as Dr. Elnoria Howard due to difficulty with hyponatremia.  He has had some difficulty with his blood pressure probably related to the fluid status and hyponatremia.  He was on a regimen of having his electrolytes checked roughly every month to keep track of his sodium levels.  He is here for follow-up.  Presently he is having no shortness of breath, coughing, fever or chills.   Review of Systems     Objective:   Physical Exam Alert and in no distress.  Cardiac exam shows regular rhythm without murmurs or gallops.  Lungs are clear to auscultation. Hospital record including admission H&P, blood work and discharge summary was reviewed.      Assessment & Plan:  History of aspiration pneumonia - Plan: SLP plan of care cert/re-cert, CBC with Differential/Platelet, Comprehensive metabolic panel I had a good discussion with him and his wife concerning fluid status, use of Pedialyte versus Gatorade, monthly evaluation of his electrolytes.  Greater than 35 minutes spent in counseling and coordination of care as well as review of records.

## 2021-12-06 ENCOUNTER — Encounter: Payer: Self-pay | Admitting: Family Medicine

## 2021-12-06 LAB — COMPREHENSIVE METABOLIC PANEL
ALT: 26 IU/L (ref 0–44)
AST: 23 IU/L (ref 0–40)
Albumin/Globulin Ratio: 1.5 (ref 1.2–2.2)
Albumin: 4.5 g/dL (ref 3.8–4.8)
Alkaline Phosphatase: 133 IU/L — ABNORMAL HIGH (ref 44–121)
BUN/Creatinine Ratio: 28 — ABNORMAL HIGH (ref 10–24)
BUN: 28 mg/dL — ABNORMAL HIGH (ref 8–27)
Bilirubin Total: 0.4 mg/dL (ref 0.0–1.2)
CO2: 26 mmol/L (ref 20–29)
Calcium: 9.9 mg/dL (ref 8.6–10.2)
Chloride: 92 mmol/L — ABNORMAL LOW (ref 96–106)
Creatinine, Ser: 1.01 mg/dL (ref 0.76–1.27)
Globulin, Total: 3.1 g/dL (ref 1.5–4.5)
Glucose: 92 mg/dL (ref 70–99)
Potassium: 5.3 mmol/L — ABNORMAL HIGH (ref 3.5–5.2)
Sodium: 133 mmol/L — ABNORMAL LOW (ref 134–144)
Total Protein: 7.6 g/dL (ref 6.0–8.5)
eGFR: 80 mL/min/{1.73_m2} (ref >59)

## 2021-12-06 LAB — CBC WITH DIFFERENTIAL/PLATELET
Basophils Absolute: 0 10*3/uL (ref 0.0–0.2)
Basos: 0 %
EOS (ABSOLUTE): 0.2 10*3/uL (ref 0.0–0.4)
Eos: 3 %
Hematocrit: 40.3 % (ref 37.5–51.0)
Hemoglobin: 13.2 g/dL (ref 13.0–17.7)
Immature Grans (Abs): 0 10*3/uL (ref 0.0–0.1)
Immature Granulocytes: 0 %
Lymphocytes Absolute: 0.6 10*3/uL — ABNORMAL LOW (ref 0.7–3.1)
Lymphs: 11 %
MCH: 28.2 pg (ref 26.6–33.0)
MCHC: 32.8 g/dL (ref 31.5–35.7)
MCV: 86 fL (ref 79–97)
Monocytes Absolute: 0.6 10*3/uL (ref 0.1–0.9)
Monocytes: 11 %
Neutrophils Absolute: 3.8 10*3/uL (ref 1.4–7.0)
Neutrophils: 75 %
Platelets: 251 10*3/uL (ref 150–450)
RBC: 4.68 x10E6/uL (ref 4.14–5.80)
RDW: 15.1 % (ref 11.6–15.4)
WBC: 5.1 10*3/uL (ref 3.4–10.8)

## 2021-12-07 ENCOUNTER — Telehealth: Payer: Self-pay

## 2021-12-07 DIAGNOSIS — E871 Hypo-osmolality and hyponatremia: Secondary | ICD-10-CM

## 2021-12-07 NOTE — Telephone Encounter (Signed)
Please put in order for future sodium checks. Thanks [employer]

## 2021-12-10 DIAGNOSIS — E46 Unspecified protein-calorie malnutrition: Secondary | ICD-10-CM | POA: Diagnosis not present

## 2021-12-10 DIAGNOSIS — R633 Feeding difficulties, unspecified: Secondary | ICD-10-CM | POA: Diagnosis not present

## 2021-12-10 NOTE — Telephone Encounter (Signed)
Sent my chart message to call and schedule nurse visit for lab ( BMET). Lee Barnes

## 2021-12-10 NOTE — Addendum Note (Signed)
Addended by: Denita Lung on: 12/10/2021 08:28 AM   Modules accepted: Orders

## 2021-12-13 ENCOUNTER — Telehealth: Payer: Medicare Other

## 2021-12-17 NOTE — Telephone Encounter (Signed)
Done KH 

## 2021-12-18 ENCOUNTER — Other Ambulatory Visit: Payer: Medicare Other

## 2021-12-18 DIAGNOSIS — G903 Multi-system degeneration of the autonomic nervous system: Secondary | ICD-10-CM | POA: Diagnosis not present

## 2021-12-18 DIAGNOSIS — I10 Essential (primary) hypertension: Secondary | ICD-10-CM | POA: Diagnosis not present

## 2021-12-19 ENCOUNTER — Other Ambulatory Visit: Payer: Self-pay

## 2021-12-19 ENCOUNTER — Other Ambulatory Visit: Payer: Medicare Other

## 2021-12-19 DIAGNOSIS — E871 Hypo-osmolality and hyponatremia: Secondary | ICD-10-CM | POA: Diagnosis not present

## 2021-12-20 LAB — BASIC METABOLIC PANEL
BUN/Creatinine Ratio: 27 — ABNORMAL HIGH (ref 10–24)
BUN: 28 mg/dL — ABNORMAL HIGH (ref 8–27)
CO2: 27 mmol/L (ref 20–29)
Calcium: 9.9 mg/dL (ref 8.6–10.2)
Chloride: 95 mmol/L — ABNORMAL LOW (ref 96–106)
Creatinine, Ser: 1.02 mg/dL (ref 0.76–1.27)
Glucose: 88 mg/dL (ref 70–99)
Potassium: 5.5 mmol/L — ABNORMAL HIGH (ref 3.5–5.2)
Sodium: 135 mmol/L (ref 134–144)
eGFR: 79 mL/min/{1.73_m2} (ref >59)

## 2021-12-28 DIAGNOSIS — M8448XD Pathological fracture, other site, subsequent encounter for fracture with routine healing: Secondary | ICD-10-CM | POA: Diagnosis not present

## 2022-01-03 ENCOUNTER — Other Ambulatory Visit: Payer: Self-pay

## 2022-01-03 ENCOUNTER — Encounter: Payer: Self-pay | Admitting: Family Medicine

## 2022-01-03 ENCOUNTER — Ambulatory Visit (INDEPENDENT_AMBULATORY_CARE_PROVIDER_SITE_OTHER): Payer: Medicare Other | Admitting: Family Medicine

## 2022-01-03 VITALS — BP 180/108 | HR 95 | Temp 96.6°F | Wt 164.2 lb

## 2022-01-03 DIAGNOSIS — I10 Essential (primary) hypertension: Secondary | ICD-10-CM | POA: Diagnosis not present

## 2022-01-03 DIAGNOSIS — Z8701 Personal history of pneumonia (recurrent): Secondary | ICD-10-CM

## 2022-01-03 DIAGNOSIS — Z85819 Personal history of malignant neoplasm of unspecified site of lip, oral cavity, and pharynx: Secondary | ICD-10-CM | POA: Diagnosis not present

## 2022-01-03 DIAGNOSIS — E871 Hypo-osmolality and hyponatremia: Secondary | ICD-10-CM | POA: Diagnosis not present

## 2022-01-03 NOTE — Patient Instructions (Signed)
Check your blood pressure weekly and if it starts to go up, let me know.  Stop the amlodipine

## 2022-01-03 NOTE — Progress Notes (Signed)
Subjective:    Patient ID: Lee Barnes, male    DOB: 06-26-1951, 71 y.o.   MRN: [id number]  HPI He is here for a wound check.  He is now adding a little bit of salt to his diet.  He did run this by the specialist and they agree since Pedialyte has no salt in it.  He has been doing this for the last month.  He is anxious to see what the numbers look like.  He also has been checking his blood pressure at home.  His machine at home has been measured against ours and is normal.  He has stopped taking his amlodipine over the last month and his pressures have been running 80/60.   Review of Systems     Objective:   Physical Exam Alert and in no distress otherwise not examined     Assessment & Plan:  Chronic hyponatremia - Plan: Comprehensive metabolic panel  white coat hypertension  History of aspiration pneumonia  History of oropharyngeal cancer I relabeled him whitecoat hypertension.  He will monitor his blood pressure on a weekly basis.  Definitely stop taking the amlodipine.  If his pressure starts to go up we can readdress the issue at that time.  He was comfortable with that

## 2022-01-04 LAB — COMPREHENSIVE METABOLIC PANEL
ALT: 40 IU/L (ref 0–44)
AST: 36 IU/L (ref 0–40)
Albumin/Globulin Ratio: 1.8 (ref 1.2–2.2)
Albumin: 4.9 g/dL — ABNORMAL HIGH (ref 3.8–4.8)
Alkaline Phosphatase: 139 IU/L — ABNORMAL HIGH (ref 44–121)
BUN/Creatinine Ratio: 28 — ABNORMAL HIGH (ref 10–24)
BUN: 26 mg/dL (ref 8–27)
Bilirubin Total: 0.4 mg/dL (ref 0.0–1.2)
CO2: 24 mmol/L (ref 20–29)
Calcium: 9.7 mg/dL (ref 8.6–10.2)
Chloride: 93 mmol/L — ABNORMAL LOW (ref 96–106)
Creatinine, Ser: 0.93 mg/dL (ref 0.76–1.27)
Globulin, Total: 2.8 g/dL (ref 1.5–4.5)
Glucose: 89 mg/dL (ref 70–99)
Potassium: 5.6 mmol/L — ABNORMAL HIGH (ref 3.5–5.2)
Sodium: 133 mmol/L — ABNORMAL LOW (ref 134–144)
Total Protein: 7.7 g/dL (ref 6.0–8.5)
eGFR: 88 mL/min/{1.73_m2} (ref >59)

## 2022-01-07 DIAGNOSIS — R633 Feeding difficulties, unspecified: Secondary | ICD-10-CM | POA: Diagnosis not present

## 2022-01-16 DIAGNOSIS — B3789 Other sites of candidiasis: Secondary | ICD-10-CM | POA: Diagnosis not present

## 2022-01-16 DIAGNOSIS — J383 Other diseases of vocal cords: Secondary | ICD-10-CM | POA: Diagnosis not present

## 2022-01-16 DIAGNOSIS — Z9221 Personal history of antineoplastic chemotherapy: Secondary | ICD-10-CM | POA: Diagnosis not present

## 2022-01-16 DIAGNOSIS — Z85810 Personal history of malignant neoplasm of tongue: Secondary | ICD-10-CM | POA: Diagnosis not present

## 2022-01-16 DIAGNOSIS — L598 Other specified disorders of the skin and subcutaneous tissue related to radiation: Secondary | ICD-10-CM | POA: Diagnosis not present

## 2022-01-16 DIAGNOSIS — J387 Other diseases of larynx: Secondary | ICD-10-CM | POA: Diagnosis not present

## 2022-01-16 DIAGNOSIS — R030 Elevated blood-pressure reading, without diagnosis of hypertension: Secondary | ICD-10-CM | POA: Diagnosis not present

## 2022-01-16 DIAGNOSIS — Z931 Gastrostomy status: Secondary | ICD-10-CM | POA: Diagnosis not present

## 2022-01-16 DIAGNOSIS — Z9089 Acquired absence of other organs: Secondary | ICD-10-CM | POA: Diagnosis not present

## 2022-01-16 DIAGNOSIS — J3801 Paralysis of vocal cords and larynx, unilateral: Secondary | ICD-10-CM | POA: Diagnosis not present

## 2022-01-16 DIAGNOSIS — J386 Stenosis of larynx: Secondary | ICD-10-CM | POA: Diagnosis not present

## 2022-01-16 DIAGNOSIS — Z923 Personal history of irradiation: Secondary | ICD-10-CM | POA: Diagnosis not present

## 2022-02-06 DIAGNOSIS — R490 Dysphonia: Secondary | ICD-10-CM | POA: Diagnosis not present

## 2022-02-06 DIAGNOSIS — J386 Stenosis of larynx: Secondary | ICD-10-CM | POA: Diagnosis not present

## 2022-02-06 DIAGNOSIS — B379 Candidiasis, unspecified: Secondary | ICD-10-CM | POA: Diagnosis not present

## 2022-02-06 DIAGNOSIS — C029 Malignant neoplasm of tongue, unspecified: Secondary | ICD-10-CM | POA: Diagnosis not present

## 2022-02-06 DIAGNOSIS — H9121 Sudden idiopathic hearing loss, right ear: Secondary | ICD-10-CM | POA: Diagnosis not present

## 2022-02-06 DIAGNOSIS — T17908A Unspecified foreign body in respiratory tract, part unspecified causing other injury, initial encounter: Secondary | ICD-10-CM | POA: Diagnosis not present

## 2022-02-25 ENCOUNTER — Telehealth: Payer: Self-pay

## 2022-02-25 DIAGNOSIS — E871 Hypo-osmolality and hyponatremia: Secondary | ICD-10-CM

## 2022-02-25 NOTE — Telephone Encounter (Signed)
Pt called and said he normally has labs done monthly to have his sodium checked. There are no orders in the system. Pt was told he has a CPE coming up in May but he said he did not want to wait that long. He is on the nurse schedule tomorrow at 11. Please advice  ?

## 2022-02-25 NOTE — Addendum Note (Signed)
Addended by: Denita Lung on: 02/25/2022 08:30 PM ? ? Modules accepted: Orders ? ?

## 2022-02-26 ENCOUNTER — Other Ambulatory Visit: Payer: Medicare Other

## 2022-02-26 DIAGNOSIS — E871 Hypo-osmolality and hyponatremia: Secondary | ICD-10-CM

## 2022-02-27 LAB — BASIC METABOLIC PANEL
BUN/Creatinine Ratio: 27 — ABNORMAL HIGH (ref 10–24)
BUN: 26 mg/dL (ref 8–27)
CO2: 23 mmol/L (ref 20–29)
Calcium: 9.5 mg/dL (ref 8.6–10.2)
Chloride: 92 mmol/L — ABNORMAL LOW (ref 96–106)
Creatinine, Ser: 0.95 mg/dL (ref 0.76–1.27)
Glucose: 95 mg/dL (ref 70–99)
Potassium: 5.2 mmol/L (ref 3.5–5.2)
Sodium: 130 mmol/L — ABNORMAL LOW (ref 134–144)
eGFR: 86 mL/min/{1.73_m2} (ref >59)

## 2022-03-22 ENCOUNTER — Encounter (HOSPITAL_COMMUNITY): Payer: Self-pay | Admitting: Gastroenterology

## 2022-03-22 ENCOUNTER — Encounter (HOSPITAL_COMMUNITY)
Admission: RE | Disposition: A | Discharge status: Home / Self Care | Payer: Self-pay | Source: Ambulatory Visit | Attending: Gastroenterology

## 2022-03-22 ENCOUNTER — Ambulatory Visit (HOSPITAL_COMMUNITY)
Admission: RE | Admit: 2022-03-22 | Discharge: 2022-03-22 | Disposition: A | Discharge status: Home / Self Care | Payer: Medicare Other | Source: Ambulatory Visit | Attending: Gastroenterology | Admitting: Gastroenterology

## 2022-03-22 DIAGNOSIS — Z431 Encounter for attention to gastrostomy: Secondary | ICD-10-CM | POA: Diagnosis not present

## 2022-03-22 DIAGNOSIS — R633 Feeding difficulties, unspecified: Secondary | ICD-10-CM | POA: Diagnosis not present

## 2022-03-22 HISTORY — PX: SAVORY DILATION: SHX5439

## 2022-03-22 HISTORY — PX: PEG PLACEMENT: SHX5437

## 2022-03-22 SURGERY — REPLACEMENT, PEG TUBE, WITHOUT ENDOSCOPY
Anesthesia: Topical

## 2022-03-22 NOTE — Progress Notes (Signed)
At the bedside the patient was placed in a supine position.  A guidewire was easily advanced into the gastric lumen.  The patient had a 22 Fr Balloon PEG and a 24 Fr was on hand.  Dilation with an 8 mm and then a 9 mm Savary was performed.  The 24 Fr PEG was easily advanced into the gastric lumen.  Gastric contents quickly filled the PEG tube.  The balloon was inflated and the bumper was secured at 4.5 cm. ?

## 2022-03-22 NOTE — Progress Notes (Signed)
Patient admitted to endoscopy for peg replacement.  Assisted Dr. Benson Norway to dilate peg insertion site with savory dilator small amount of heme.  53fpeg replacement placed and dressing applied.  Patient tolerated well. ?

## 2022-03-22 NOTE — H&P (Signed)
Lee Barnes ?HPI: The patient's balloon PEG fell out 40 minutes ago.  He is here for a PEG tube replacement. ? ?Past Medical History:  ?Diagnosis Date  ? Allergy to environmental factors   ? Complication of anesthesia   ? "vageled" after surgery -overnite stay  ? Diverticulosis 2008  ? Heart murmur   ? hx of in childhood   ? History of chemotherapy   ? History of radiation therapy 11/05/10-12/26/10  ? r base tongue, 7000 cGy 35 sessions  ? Hypertension   ? Medication started in May 2016.  ? 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 Pneumonia   ? hx of 2012  ? 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 Tinnitus   ? ? ?Past Surgical History:  ?Procedure Laterality Date  ? APPENDECTOMY    ? COLONOSCOPY    ? INGUINAL HERNIA REPAIR Bilateral 03/30/2015  ? Procedure: LAPAROSCOPIC BILATERAL INGUINAL HERNIA REPAIR WITH MESH;  Surgeon: Coralie Keens, MD;  Location: [facility];  Service: General;  Laterality: Bilateral;  ? INGUINAL HERNIA REPAIR Right 1960  ? INGUINAL HERNIA REPAIR Bilateral 03/30/2015  ? INGUINAL HERNIA REPAIR Left 07/11/2015  ? INGUINAL HERNIA REPAIR N/A 07/11/2015  ? Procedure: REPAIR OF LEFT INGUINAL HERNIA WITH MESH;  Surgeon: Coralie Keens, MD;  Location: [facility];  Service: General;  Laterality: N/A;  ? INSERTION OF MESH N/A 07/11/2015  ? Procedure: INSERTION OF MESH;  Surgeon: Coralie Keens, MD;  Location: [facility];  Service: General;  Laterality: N/A;  ? IR [place] W/FLUORO  12/03/2021  ? LUMBAR DISC SURGERY    ? TONSILLECTOMY    ? ? ?Family History  ?Problem Relation Age of Onset  ? Heart disease Father   ? Cancer Father   ?     throat ca  ? Heart disease Mother   ? Colon cancer Neg Hx   ? ? ?Social History:  reports that he has never smoked. He has never used smokeless tobacco. He reports current alcohol use. He reports that he does not use drugs. ? ?Allergies:  ?Allergies  ?Allergen Reactions  ? Codeine Nausea And Vomiting  ? ? ?Medications: Scheduled: ?Continuous: ? ?No  results found for this or any previous visit (from the past 24 hour(s)).  ? ?No results found. ? ?ROS:  As stated above in the HPI otherwise negative. ? ?There were no vitals taken for this visit.   ? ?PE: ?Gen: NAD, Alert and Oriented ?HEENT:  [place]/AT, EOMI ?Neck: Supple, no LAD ?Lungs: CTA Bilaterally ?CV: RRR without M/G/R ?ABD: Soft, NTND, +BS, PEG site erythematous, unchanged. ?Ext: No C/C/E ? ?Assessment/Plan: ?1) Feeding difficulties - PEG tube replacement. ? ?Lee Barnes D ?03/22/2022, 2:45 PM  ? ? ?  ?

## 2022-04-03 ENCOUNTER — Telehealth: Payer: Self-pay | Admitting: Family Medicine

## 2022-04-03 NOTE — Telephone Encounter (Signed)
Spoke with patient to schedule Medicare Annual Wellness Visit (AWV) either virtually or in office prior to his appt on 04/10/22  ? ?Pt declined he will be out of town  ? ? ?Last AWV5/16/23 ? ; please schedule at anytime with health coach ? ? ?

## 2022-04-10 ENCOUNTER — Ambulatory Visit (INDEPENDENT_AMBULATORY_CARE_PROVIDER_SITE_OTHER): Payer: Medicare Other | Admitting: Family Medicine

## 2022-04-10 ENCOUNTER — Encounter: Payer: Self-pay | Admitting: Family Medicine

## 2022-04-10 VITALS — BP 158/92 | HR 93 | Temp 96.8°F | Ht 72.5 in | Wt 166.2 lb

## 2022-04-10 DIAGNOSIS — E871 Hypo-osmolality and hyponatremia: Secondary | ICD-10-CM

## 2022-04-10 DIAGNOSIS — H9113 Presbycusis, bilateral: Secondary | ICD-10-CM | POA: Diagnosis not present

## 2022-04-10 DIAGNOSIS — G903 Multi-system degeneration of the autonomic nervous system: Secondary | ICD-10-CM | POA: Diagnosis not present

## 2022-04-10 DIAGNOSIS — Z85819 Personal history of malignant neoplasm of unspecified site of lip, oral cavity, and pharynx: Secondary | ICD-10-CM | POA: Diagnosis not present

## 2022-04-10 DIAGNOSIS — J3800 Paralysis of vocal cords and larynx, unspecified: Secondary | ICD-10-CM

## 2022-04-10 DIAGNOSIS — J301 Allergic rhinitis due to pollen: Secondary | ICD-10-CM

## 2022-04-10 DIAGNOSIS — I10 Essential (primary) hypertension: Secondary | ICD-10-CM | POA: Diagnosis not present

## 2022-04-10 DIAGNOSIS — N529 Male erectile dysfunction, unspecified: Secondary | ICD-10-CM

## 2022-04-10 DIAGNOSIS — H9191 Unspecified hearing loss, right ear: Secondary | ICD-10-CM | POA: Diagnosis not present

## 2022-04-10 NOTE — Progress Notes (Deleted)
Lee Barnes is a 71 y.o. male who presents for annual wellness visit and follow-up on chronic medical conditions.  He has the following concerns:   Immunizations and Health Maintenance Immunization History  Administered Date(s) Administered   Fluad Quad(high Dose 65+) 09/25/2021   Influenza Split 10/07/2011, 09/23/2012   Influenza, High Dose Seasonal PF 10/13/2017, 09/22/2018   Influenza, Quadrivalent, Recombinant, Inj, Pf 08/23/2019   Influenza-Unspecified 09/19/2019   Moderna Covid-19 Vaccine Bivalent Booster 72yr & up 09/25/2021   Moderna SARS-COV2 Booster Vaccination 02/28/2021   Moderna Sars-Covid-2 Vaccination 12/07/2019, 01/04/2020   Pneumococcal Conjugate-13 11/19/2017   Pneumococcal-Unspecified 08/18/2014   Tdap 10/06/2014   Zoster, Live 12/14/2013   Health Maintenance Due  Topic Date Due   Zoster Vaccines- Shingrix (1 of 2) Never done   Pneumonia Vaccine 71 Years old (2 - PPSV23 if available, else PCV20) 11/19/2018   COVID-19 Vaccine (3 - Moderna risk series) 09/25/2021    Last colonoscopy: Last PSA: Dentist: Ophtho: Exercise:  Other doctors caring for patient include:  Advanced Directives:    Depression screen:  See questionnaire below.        04/02/2021    8:32 AM 03/31/2020    8:46 AM 03/30/2019    8:53 AM 11/19/2017    9:02 AM 07/27/2015    2:27 PM  Depression screen PHQ 2/9  Decreased Interest 0 0 0 0 0  Down, Depressed, Hopeless 0 0 0 0 0  PHQ - 2 Score 0 0 0 0 0    Fall Screen: See Questionaire below.      04/02/2021    8:32 AM 03/31/2020    8:46 AM 03/30/2019    8:53 AM 11/19/2017    9:02 AM 07/27/2015    2:27 PM  Fall Risk   Falls in the past year? 0 0 0 No No  Number falls in past yr: 0      Injury with Fall? 0      Risk for fall due to : No Fall Risks      Follow up Falls evaluation completed        ADL screen:  See questionnaire below.  Functional Status Survey:     Review of Systems  Constitutional: -, -unexpected weight  change, -anorexia, -fatigue Allergy: -sneezing, -itching, -congestion Dermatology: denies changing moles, rash, lumps ENT: -runny nose, -ear pain, -sore throat,  Cardiology:  -chest pain, -palpitations, -orthopnea, Respiratory: -cough, -shortness of breath, -dyspnea on exertion, -wheezing,  Gastroenterology: -abdominal pain, -nausea, -vomiting, -diarrhea, -constipation, -dysphagia Hematology: -bleeding or bruising problems Musculoskeletal: -arthralgias, -myalgias, -joint swelling, -back pain, - Ophthalmology: -vision changes,  Urology: -dysuria, -difficulty urinating,  -urinary frequency, -urgency, incontinence Neurology: -, -numbness, , -memory loss, -falls, -dizziness    PHYSICAL EXAM:  There were no vitals taken for this visit.  General Appearance: Alert, cooperative, no distress, appears stated age Head: Normocephalic, without obvious abnormality, atraumatic Eyes: PERRL, conjunctiva/corneas clear, EOM's intact, fundi benign Ears: Normal TM's and external ear canals Nose: Nares normal, mucosa normal, no drainage or sinus   tenderness Throat: Lips, mucosa, and tongue normal; teeth and gums normal Neck: Supple, no lymphadenopathy, thyroid:no enlargement/tenderness/nodules; no carotid bruit or JVD Lungs: Clear to auscultation bilaterally without wheezes, rales or ronchi; respirations unlabored Heart: Regular rate and rhythm, S1 and S2 normal, no murmur, rub or gallop Abdomen: Soft, non-tender, nondistended, normoactive bowel sounds, no masses, no hepatosplenomegaly Extremities: No clubbing, cyanosis or edema Pulses: 2+ and symmetric all extremities Skin: Skin color, texture, turgor normal, no rashes or  lesions Lymph nodes: Cervical, supraclavicular, and axillary nodes normal Neurologic: CNII-XII intact, normal strength, sensation and gait; reflexes 2+ and symmetric throughout   Psych: Normal mood, affect, hygiene and grooming  ASSESSMENT/PLAN: No diagnosis found.    Discussed  PSA screening (risks/benefits), recommended at least 30 minutes of aerobic activity at least 5 days/week; proper sunscreen use reviewed; healthy diet and alcohol recommendations (less than or equal to 2 drinks/day) reviewed; regular seatbelt use; changing batteries in smoke detectors. Immunization recommendations discussed.  Colonoscopy recommendations reviewed.   Medicare Attestation I have personally reviewed: The patient's medical and social history Their use of alcohol, tobacco or illicit drugs Their current medications and supplements The patient's functional ability including ADLs,fall risks, home safety risks, cognitive, and hearing and visual impairment Diet and physical activities Evidence for depression or mood disorders  The patient's weight, height, and BMI have been recorded in the chart.  I have made referrals, counseling, and provided education to the patient based on review of the above and I have provided the patient with a written personalized care plan for preventive services.     Jill Alexanders, MD   04/10/2022

## 2022-04-10 NOTE — Progress Notes (Signed)
Med check plus   Subjective   Patient ID: Lee Barnes, male    DOB: 1951/05/09  Age: 71 y.o. MRN: [id number]  Chief Complaint  Patient presents with   med check plus     Non fasting had awv 11/22   He is here for an interval evaluation.  Review of the record indicates need for Shingrix.  He does keep track of his blood pressure.  In the blood pressures tend to fluctuate greatly.  His blood pressure cuff at home is accurate.  His most recent blood pressure this morning at home was 120/70.  He does have a G-tube present and does get his formula sent to him.  As long as he stays on the formula, he seems to do well.  His allergies are under good control with Flonase and Zyrtec.  He periodically sees oncology as well as ENT.  He does have evidence of vocal cord paralysis on the right as well as decreased hearing on the right.  He does wear hearing aid on the left side.  He has had a great deal of difficulty dealing with hyponatremia.  Presently he is having no weakness, malaise, fatigue.  He does have underlying ADD but presently is not in need of any medication.  he has no other concerns.  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   Tinnitus    Social History   Tobacco Use   Smoking status: Never   Smokeless tobacco: Never  Substance Use Topics   Alcohol use: Yes    Comment: occas drinks a beer every few weeks    Drug use: No    Comment:     Family History  Problem Relation Age of Onset   Heart disease Father    Cancer Father        throat ca   Heart disease Mother    Colon cancer Neg Hx    Allergies  Allergen Reactions   Codeine Nausea And Vomiting      Review of Systems  All other systems reviewed and are negative.    Objective:      BP Readings from Last 3 Encounters:  04/10/22 (!) 158/92  01/03/22 (!) 180/108  12/05/21 (!) 144/90      Physical Exam  Last CBC Lab Results  Component Value Date   WBC 5.1 12/05/2021   HGB 13.2 12/05/2021   HCT 40.3 12/05/2021   MCV  86 12/05/2021   MCH 28.2 12/05/2021   RDW 15.1 12/05/2021   PLT 251 12/05/2021   Last metabolic panel Lab Results  Component Value Date   GLUCOSE 95 02/26/2022   NA 130 (L) 02/26/2022   K 5.2 02/26/2022   CL 92 (L) 02/26/2022   CO2 23 02/26/2022   BUN 26 02/26/2022   CREATININE 0.95 02/26/2022   EGFR 86 02/26/2022   CALCIUM 9.5 02/26/2022   PHOS 2.7 12/03/2021   PROT 7.7 01/03/2022   ALBUMIN 4.9 (H) 01/03/2022   LABGLOB 2.8 01/03/2022   AGRATIO 1.8 01/03/2022   BILITOT 0.4 01/03/2022   ALKPHOS 139 (H) 01/03/2022   AST 36 01/03/2022   ALT 40 01/03/2022   ANIONGAP 7 12/03/2021   Last lipids Lab Results  Component Value Date   CHOL 162 03/31/2020   HDL 55 03/31/2020   LDLCALC 95 03/31/2020   TRIG 60 03/31/2020   CHOLHDL 2.9 03/31/2020      The 10-year ASCVD risk score (Arnett DK, et al., 2019) is: 25.3%    Assessment & Plan:  History of oropharyngeal cancer  Seasonal  allergic rhinitis due to pollen  Essential hypertension  Presbycusis of both ears  Hyponatremia  Neurogenic orthostatic hypotension (HCC)  Chronic hyponatremia - Plan: Comprehensive metabolic panel  Erectile dysfunction, unspecified erectile dysfunction type  Vocal cord paralysis  Hearing deficit, right He presently is not on a blood pressure medication.  He will continue on his allergy meds.  Encouraged him to get the Shingrix vaccine.  He is not interested in getting COVID-vaccine.   Return for already scheduled in dec.    Sharlot Gowda, MD

## 2022-04-11 LAB — COMPREHENSIVE METABOLIC PANEL
ALT: 31 IU/L (ref 0–44)
AST: 28 IU/L (ref 0–40)
Albumin/Globulin Ratio: 1.4 (ref 1.2–2.2)
Albumin: 4.7 g/dL (ref 3.8–4.8)
Alkaline Phosphatase: 134 IU/L — ABNORMAL HIGH (ref 44–121)
BUN/Creatinine Ratio: 26 — ABNORMAL HIGH (ref 10–24)
BUN: 24 mg/dL (ref 8–27)
Bilirubin Total: 0.4 mg/dL (ref 0.0–1.2)
CO2: 24 mmol/L (ref 20–29)
Calcium: 10.2 mg/dL (ref 8.6–10.2)
Chloride: 90 mmol/L — ABNORMAL LOW (ref 96–106)
Creatinine, Ser: 0.93 mg/dL (ref 0.76–1.27)
Globulin, Total: 3.3 g/dL (ref 1.5–4.5)
Glucose: 109 mg/dL — ABNORMAL HIGH (ref 70–99)
Potassium: 5.2 mmol/L (ref 3.5–5.2)
Sodium: 129 mmol/L — ABNORMAL LOW (ref 134–144)
Total Protein: 8 g/dL (ref 6.0–8.5)
eGFR: 88 mL/min/{1.73_m2} (ref >59)

## 2022-04-25 ENCOUNTER — Encounter: Payer: Self-pay | Admitting: Family Medicine

## 2022-06-04 ENCOUNTER — Telehealth: Payer: Self-pay | Admitting: Family Medicine

## 2022-06-04 DIAGNOSIS — E871 Hypo-osmolality and hyponatremia: Secondary | ICD-10-CM

## 2022-06-04 NOTE — Telephone Encounter (Signed)
Pt called and asked for blood work to be done but I don't see any orders for this. Please advise

## 2022-06-05 ENCOUNTER — Telehealth: Payer: Self-pay | Admitting: Family Medicine

## 2022-06-05 ENCOUNTER — Other Ambulatory Visit: Payer: Medicare Other

## 2022-06-05 DIAGNOSIS — E871 Hypo-osmolality and hyponatremia: Secondary | ICD-10-CM

## 2022-06-05 DIAGNOSIS — J3089 Other allergic rhinitis: Secondary | ICD-10-CM

## 2022-06-05 DIAGNOSIS — R5383 Other fatigue: Secondary | ICD-10-CM | POA: Diagnosis not present

## 2022-06-05 DIAGNOSIS — E559 Vitamin D deficiency, unspecified: Secondary | ICD-10-CM

## 2022-06-05 MED ORDER — FLUTICASONE PROPIONATE 50 MCG/ACT NA SUSP: NASAL | 5 refills | Status: DC

## 2022-06-05 NOTE — Telephone Encounter (Signed)
Lee Barnes requesting a refill on his Fluticasone.

## 2022-06-06 LAB — BASIC METABOLIC PANEL
BUN/Creatinine Ratio: 29 — ABNORMAL HIGH (ref 10–24)
BUN: 26 mg/dL (ref 8–27)
CO2: 23 mmol/L (ref 20–29)
Calcium: 10 mg/dL (ref 8.6–10.2)
Chloride: 93 mmol/L — ABNORMAL LOW (ref 96–106)
Creatinine, Ser: 0.9 mg/dL (ref 0.76–1.27)
Glucose: 84 mg/dL (ref 70–99)
Potassium: 5.4 mmol/L — ABNORMAL HIGH (ref 3.5–5.2)
Sodium: 133 mmol/L — ABNORMAL LOW (ref 134–144)
eGFR: 92 mL/min/{1.73_m2} (ref >59)

## 2022-06-06 LAB — VITAMIN D 25 HYDROXY (VIT D DEFICIENCY, FRACTURES): Vit D, 25-Hydroxy: 44.3 ng/mL (ref 30.0–100.0)

## 2022-06-18 DIAGNOSIS — I951 Orthostatic hypotension: Secondary | ICD-10-CM | POA: Diagnosis not present

## 2022-06-18 DIAGNOSIS — I10 Essential (primary) hypertension: Secondary | ICD-10-CM | POA: Diagnosis not present

## 2022-06-24 DIAGNOSIS — L570 Actinic keratosis: Secondary | ICD-10-CM | POA: Diagnosis not present

## 2022-06-24 DIAGNOSIS — L821 Other seborrheic keratosis: Secondary | ICD-10-CM | POA: Diagnosis not present

## 2022-06-24 DIAGNOSIS — C44319 Basal cell carcinoma of skin of other parts of face: Secondary | ICD-10-CM | POA: Diagnosis not present

## 2022-06-24 DIAGNOSIS — L820 Inflamed seborrheic keratosis: Secondary | ICD-10-CM | POA: Diagnosis not present

## 2022-06-24 DIAGNOSIS — D485 Neoplasm of uncertain behavior of skin: Secondary | ICD-10-CM | POA: Diagnosis not present

## 2022-06-24 DIAGNOSIS — Z85828 Personal history of other malignant neoplasm of skin: Secondary | ICD-10-CM | POA: Diagnosis not present

## 2022-06-24 DIAGNOSIS — D225 Melanocytic nevi of trunk: Secondary | ICD-10-CM | POA: Diagnosis not present

## 2022-06-24 HISTORY — PX: SKIN BIOPSY: SHX1

## 2022-06-25 ENCOUNTER — Emergency Department (HOSPITAL_COMMUNITY): Payer: Medicare Other

## 2022-06-25 ENCOUNTER — Other Ambulatory Visit: Payer: Self-pay

## 2022-06-25 ENCOUNTER — Observation Stay (HOSPITAL_COMMUNITY)
Admission: EM | Admit: 2022-06-25 | Discharge: 2022-06-27 | Disposition: A | Discharge status: Home / Self Care | Payer: Medicare Other | Attending: Internal Medicine | Admitting: Internal Medicine

## 2022-06-25 DIAGNOSIS — J189 Pneumonia, unspecified organism: Secondary | ICD-10-CM | POA: Diagnosis not present

## 2022-06-25 DIAGNOSIS — Z789 Other specified health status: Secondary | ICD-10-CM

## 2022-06-25 DIAGNOSIS — I10 Essential (primary) hypertension: Secondary | ICD-10-CM | POA: Diagnosis not present

## 2022-06-25 DIAGNOSIS — E871 Hypo-osmolality and hyponatremia: Secondary | ICD-10-CM | POA: Diagnosis present

## 2022-06-25 DIAGNOSIS — R059 Cough, unspecified: Secondary | ICD-10-CM | POA: Diagnosis not present

## 2022-06-25 DIAGNOSIS — K219 Gastro-esophageal reflux disease without esophagitis: Secondary | ICD-10-CM

## 2022-06-25 DIAGNOSIS — R509 Fever, unspecified: Secondary | ICD-10-CM | POA: Diagnosis present

## 2022-06-25 DIAGNOSIS — R918 Other nonspecific abnormal finding of lung field: Secondary | ICD-10-CM | POA: Diagnosis not present

## 2022-06-25 DIAGNOSIS — Z85828 Personal history of other malignant neoplasm of skin: Secondary | ICD-10-CM | POA: Insufficient documentation

## 2022-06-25 DIAGNOSIS — Z85818 Personal history of malignant neoplasm of other sites of lip, oral cavity, and pharynx: Secondary | ICD-10-CM | POA: Insufficient documentation

## 2022-06-25 DIAGNOSIS — I951 Orthostatic hypotension: Secondary | ICD-10-CM | POA: Insufficient documentation

## 2022-06-25 DIAGNOSIS — J309 Allergic rhinitis, unspecified: Secondary | ICD-10-CM | POA: Diagnosis not present

## 2022-06-25 DIAGNOSIS — J690 Pneumonitis due to inhalation of food and vomit: Secondary | ICD-10-CM | POA: Diagnosis not present

## 2022-06-25 DIAGNOSIS — R1310 Dysphagia, unspecified: Secondary | ICD-10-CM

## 2022-06-25 DIAGNOSIS — Z79899 Other long term (current) drug therapy: Secondary | ICD-10-CM | POA: Insufficient documentation

## 2022-06-25 DIAGNOSIS — E440 Moderate protein-calorie malnutrition: Secondary | ICD-10-CM | POA: Diagnosis not present

## 2022-06-25 DIAGNOSIS — Z20822 Contact with and (suspected) exposure to covid-19: Secondary | ICD-10-CM | POA: Diagnosis not present

## 2022-06-25 DIAGNOSIS — Z85819 Personal history of malignant neoplasm of unspecified site of lip, oral cavity, and pharynx: Secondary | ICD-10-CM | POA: Diagnosis present

## 2022-06-25 DIAGNOSIS — G903 Multi-system degeneration of the autonomic nervous system: Secondary | ICD-10-CM | POA: Diagnosis present

## 2022-06-25 DIAGNOSIS — J9601 Acute respiratory failure with hypoxia: Secondary | ICD-10-CM | POA: Diagnosis present

## 2022-06-25 LAB — CBC WITH DIFFERENTIAL/PLATELET
Abs Immature Granulocytes: 0.05 10*3/uL (ref 0.00–0.07)
Basophils Absolute: 0 10*3/uL (ref 0.0–0.1)
Basophils Relative: 0 %
Eosinophils Absolute: 0 10*3/uL (ref 0.0–0.5)
Eosinophils Relative: 0 %
HCT: 41.3 % (ref 39.0–52.0)
Hemoglobin: 13.9 g/dL (ref 13.0–17.0)
Immature Granulocytes: 0 %
Lymphocytes Relative: 3 %
Lymphs Abs: 0.4 10*3/uL — ABNORMAL LOW (ref 0.7–4.0)
MCH: 29.3 pg (ref 26.0–34.0)
MCHC: 33.7 g/dL (ref 30.0–36.0)
MCV: 87.1 fL (ref 80.0–100.0)
Monocytes Absolute: 1.2 10*3/uL — ABNORMAL HIGH (ref 0.1–1.0)
Monocytes Relative: 9 %
Neutro Abs: 11.8 10*3/uL — ABNORMAL HIGH (ref 1.7–7.7)
Neutrophils Relative %: 88 %
Platelets: 197 10*3/uL (ref 150–400)
RBC: 4.74 MIL/uL (ref 4.22–5.81)
RDW: 14.7 % (ref 11.5–15.5)
WBC: 13.5 10*3/uL — ABNORMAL HIGH (ref 4.0–10.5)
nRBC: 0 % (ref 0.0–0.2)

## 2022-06-25 LAB — COMPREHENSIVE METABOLIC PANEL
ALT: 28 U/L (ref 0–44)
AST: 27 U/L (ref 15–41)
Albumin: 4.2 g/dL (ref 3.5–5.0)
Alkaline Phosphatase: 107 U/L (ref 38–126)
Anion gap: 10 (ref 5–15)
BUN: 29 mg/dL — ABNORMAL HIGH (ref 8–23)
CO2: 25 mmol/L (ref 22–32)
Calcium: 9.5 mg/dL (ref 8.9–10.3)
Chloride: 94 mmol/L — ABNORMAL LOW (ref 98–111)
Creatinine, Ser: 1.06 mg/dL (ref 0.61–1.24)
GFR, Estimated: >60 mL/min (ref >60)
Glucose, Bld: 123 mg/dL — ABNORMAL HIGH (ref 70–99)
Potassium: 5.1 mmol/L (ref 3.5–5.1)
Sodium: 129 mmol/L — ABNORMAL LOW (ref 135–145)
Total Bilirubin: 0.5 mg/dL (ref 0.3–1.2)
Total Protein: 8 g/dL (ref 6.5–8.1)

## 2022-06-25 NOTE — ED Triage Notes (Signed)
Patient coming to ED for evaluation of fever.  Reports hx of aspiration PNA.  Has feeding tube in place.  Tonight he began to have reflux which caused vomiting.  Family concerned that patient might have a PNA.  States "I can tell he just feels bad."

## 2022-06-25 NOTE — ED Provider Triage Note (Signed)
Emergency Medicine Provider Triage Evaluation Note  Lee Barnes , a 71 y.o. male  was evaluated in triage.  Pt complains of concerned about aspiration pneumonia  Review of Systems  Positive: fever Negative: pain  Physical Exam  BP 93/72 (BP Location: Left Arm)   Pulse (!) 101   Temp 98.1 F (36.7 C) (Oral)   Resp 16   Ht 6' (1.829 m)   Wt 72.6 kg   SpO2 95%   BMI 21.70 kg/m  Gen:   Awake, no distress   Resp:  Normal effort  MSK:   Moves extremities without difficulty  Other:    Medical Decision Making  Medically screening exam initiated at 10:54 PM.  Appropriate orders placed.  DAMAREA MERKEL was informed that the remainder of the evaluation will be completed by another provider, this initial triage assessment does not replace that evaluation, and the importance of remaining in the ED until their evaluation is complete.     Fransico Meadow, [place] 06/25/22 2255

## 2022-06-26 ENCOUNTER — Encounter (HOSPITAL_COMMUNITY): Payer: Self-pay | Admitting: Emergency Medicine

## 2022-06-26 DIAGNOSIS — J9601 Acute respiratory failure with hypoxia: Secondary | ICD-10-CM | POA: Diagnosis present

## 2022-06-26 DIAGNOSIS — E871 Hypo-osmolality and hyponatremia: Secondary | ICD-10-CM | POA: Diagnosis not present

## 2022-06-26 DIAGNOSIS — R1312 Dysphagia, oropharyngeal phase: Secondary | ICD-10-CM | POA: Diagnosis not present

## 2022-06-26 DIAGNOSIS — J690 Pneumonitis due to inhalation of food and vomit: Secondary | ICD-10-CM | POA: Diagnosis not present

## 2022-06-26 DIAGNOSIS — G903 Multi-system degeneration of the autonomic nervous system: Secondary | ICD-10-CM | POA: Diagnosis not present

## 2022-06-26 DIAGNOSIS — I10 Essential (primary) hypertension: Secondary | ICD-10-CM

## 2022-06-26 LAB — PROTIME-INR
INR: 1.1 (ref 0.8–1.2)
Prothrombin Time: 14.2 seconds (ref 11.4–15.2)

## 2022-06-26 LAB — URINALYSIS, ROUTINE W REFLEX MICROSCOPIC
Bilirubin Urine: NEGATIVE
Glucose, UA: NEGATIVE mg/dL
Hgb urine dipstick: NEGATIVE
Ketones, ur: NEGATIVE mg/dL
Leukocytes,Ua: NEGATIVE
Nitrite: NEGATIVE
Protein, ur: NEGATIVE mg/dL
Specific Gravity, Urine: 1.008 (ref 1.005–1.030)
pH: 7 (ref 5.0–8.0)

## 2022-06-26 LAB — RESP PANEL BY RT-PCR (FLU A&B, COVID) ARPGX2
Influenza A by PCR: NEGATIVE
Influenza B by PCR: NEGATIVE
SARS Coronavirus 2 by RT PCR: NEGATIVE

## 2022-06-26 LAB — GLUCOSE, CAPILLARY: Glucose-Capillary: 120 mg/dL — ABNORMAL HIGH (ref 70–99)

## 2022-06-26 LAB — LACTIC ACID, PLASMA
Lactic Acid, Venous: 1.1 mmol/L (ref 0.5–1.9)
Lactic Acid, Venous: 1.2 mmol/L (ref 0.5–1.9)

## 2022-06-26 LAB — APTT: aPTT: 26 seconds (ref 24–36)

## 2022-06-26 MED ORDER — LACTATED RINGERS IV SOLN: INTRAVENOUS | Status: DC

## 2022-06-26 MED ORDER — LOPERAMIDE HCL 2 MG PO CAPS: 2.0000 mg | ORAL_CAPSULE | ORAL | Status: DC | PRN

## 2022-06-26 MED ORDER — ACETAMINOPHEN 325 MG PO TABS: 650.0000 mg | ORAL_TABLET | Freq: Four times a day (QID) | ORAL | Status: DC | PRN

## 2022-06-26 MED ORDER — LABETALOL HCL 5 MG/ML IV SOLN
20.0000 mg | INTRAVENOUS | Status: DC | PRN
  Administered 2022-06-26: 20 mg via INTRAVENOUS
  Filled 2022-06-26 (×2): qty 4

## 2022-06-26 MED ORDER — FREE WATER
100.0000 mL | Freq: Four times a day (QID) | Status: DC
  Administered 2022-06-26 – 2022-06-27 (×4): 100 mL

## 2022-06-26 MED ORDER — ONDANSETRON HCL 4 MG PO TABS: 4.0000 mg | ORAL_TABLET | Freq: Four times a day (QID) | ORAL | Status: DC | PRN

## 2022-06-26 MED ORDER — LACTATED RINGERS IV BOLUS (SEPSIS)
500.0000 mL | Freq: Once | INTRAVENOUS | Status: AC
  Administered 2022-06-26: 500 mL via INTRAVENOUS

## 2022-06-26 MED ORDER — PANTOPRAZOLE 2 MG/ML SUSPENSION
40.0000 mg | Freq: Every day | ORAL | Status: DC
  Administered 2022-06-27: 40 mg
  Filled 2022-06-26: qty 20

## 2022-06-26 MED ORDER — PANTOPRAZOLE SODIUM 40 MG IV SOLR
40.0000 mg | Freq: Once | INTRAVENOUS | Status: AC
  Administered 2022-06-26: 40 mg via INTRAVENOUS
  Filled 2022-06-26: qty 10

## 2022-06-26 MED ORDER — ENOXAPARIN SODIUM 40 MG/0.4ML IJ SOSY
40.0000 mg | PREFILLED_SYRINGE | INTRAMUSCULAR | Status: DC
  Administered 2022-06-26: 40 mg via SUBCUTANEOUS
  Filled 2022-06-26: qty 0.4

## 2022-06-26 MED ORDER — SODIUM CHLORIDE 0.9 % IV SOLN
2.0000 g | Freq: Once | INTRAVENOUS | Status: AC
  Administered 2022-06-26: 2 g via INTRAVENOUS
  Filled 2022-06-26: qty 12.5

## 2022-06-26 MED ORDER — LORATADINE 10 MG PO TABS
10.0000 mg | ORAL_TABLET | Freq: Every day | ORAL | Status: DC
  Administered 2022-06-26 – 2022-06-27 (×2): 10 mg
  Filled 2022-06-26 (×2): qty 1

## 2022-06-26 MED ORDER — LOPERAMIDE HCL 1 MG/7.5ML PO SUSP: 2.0000 mg | ORAL | Status: DC | PRN

## 2022-06-26 MED ORDER — IPRATROPIUM-ALBUTEROL 0.5-2.5 (3) MG/3ML IN SOLN: 3.0000 mL | RESPIRATORY_TRACT | Status: DC | PRN

## 2022-06-26 MED ORDER — ACETAMINOPHEN 650 MG RE SUPP: 650.0000 mg | Freq: Four times a day (QID) | RECTAL | Status: DC | PRN

## 2022-06-26 MED ORDER — ONDANSETRON HCL 4 MG/2ML IJ SOLN: 4.0000 mg | Freq: Four times a day (QID) | INTRAMUSCULAR | Status: DC | PRN

## 2022-06-26 MED ORDER — VANCOMYCIN HCL 1500 MG/300ML IV SOLN: 1500.0000 mg | INTRAVENOUS | Status: DC

## 2022-06-26 MED ORDER — ISOSOURCE 1.5 CAL PO LIQD
375.0000 mL | Freq: Four times a day (QID) | ORAL | Status: DC
  Administered 2022-06-26 – 2022-06-27 (×2): 375 mL

## 2022-06-26 MED ORDER — OSMOLITE 1.2 CAL PO LIQD
1000.0000 mL | ORAL | Status: DC
  Filled 2022-06-26: qty 1000

## 2022-06-26 MED ORDER — SODIUM CHLORIDE 0.9 % IV SOLN
3.0000 g | Freq: Four times a day (QID) | INTRAVENOUS | Status: DC
  Administered 2022-06-26 – 2022-06-27 (×4): 3 g via INTRAVENOUS
  Filled 2022-06-26 (×5): qty 8

## 2022-06-26 MED ORDER — VANCOMYCIN HCL IN DEXTROSE 1-5 GM/200ML-% IV SOLN
1000.0000 mg | Freq: Once | INTRAVENOUS | Status: AC
  Administered 2022-06-26: 1000 mg via INTRAVENOUS
  Filled 2022-06-26: qty 200

## 2022-06-26 MED ORDER — FLUTICASONE PROPIONATE 50 MCG/ACT NA SUSP
2.0000 | Freq: Every day | NASAL | Status: DC
  Filled 2022-06-26 (×2): qty 16

## 2022-06-26 MED ORDER — MIDODRINE HCL 5 MG PO TABS: 2.5000 mg | ORAL_TABLET | Freq: Three times a day (TID) | ORAL | Status: DC | PRN

## 2022-06-26 MED ORDER — ISOSOURCE 1.5 CAL PO LIQD
375.0000 mL | Freq: Four times a day (QID) | ORAL | Status: DC
Start: 2022-06-26 — End: 2022-06-26
  Administered 2022-06-26: 375 mL

## 2022-06-26 MED ORDER — SODIUM CHLORIDE 0.9 % IV SOLN
2.0000 g | Freq: Three times a day (TID) | INTRAVENOUS | Status: DC
  Administered 2022-06-26: 2 g via INTRAVENOUS
  Filled 2022-06-26: qty 12.5

## 2022-06-26 NOTE — Progress Notes (Signed)
Pharmacy Antibiotic Note  Lee Barnes is a 71 y.o. male admitted on 06/25/2022 with fever, hx of aspiration pna, has feeding tube in place.  Tonight he began to have reflux which caused vomiting.  Pharmacy has been consulted to dose vancomycin and cefepime for pna  Plan: Vancomycin 1gm IV x 1 then '1500mg'$  q24h (AUC 480.9, Scr 1.06, TBW) Cefepime 2gm IV q8h Follow renal function, cultures and  clinical course   Height: 6' (182.9 cm) Weight: 72.6 kg (160 lb) IBW/kg (Calculated) : 77.6  Temp (24hrs), Avg:98.1 F (36.7 C), Min:98.1 F (36.7 C), Max:98.1 F (36.7 C)  Recent Labs  Lab 06/25/22 2311  WBC 13.5*  CREATININE 1.06    Estimated Creatinine Clearance: 66.6 mL/min (by C-G formula based on SCr of 1.06 mg/dL).    Allergies  Allergen Reactions   Codeine Nausea And Vomiting    Antimicrobials this admission: 8/9 vanc >> 8/9 cefepime >>  Dose adjustments this admission:   Microbiology results: 8/9 BCx:  8/9 UCx:   Thank you for allowing pharmacy to be a part of this patient's care.  Dolly Rias RPh 06/26/2022, 1:01 AM

## 2022-06-26 NOTE — ED Notes (Signed)
Attempted to put a lopez valve on feeding tube but patient refused due to bulk of stopcock.  Wife still wanted it so it was given to wife.  Family is aware of need to bring in tube feeding for patient as we do not carry that brand here.

## 2022-06-26 NOTE — ED Provider Notes (Signed)
[facility] DEPT Provider Note   CSN: [id number] Arrival date & time: 06/25/22  2146     History  Chief Complaint  Patient presents with  . Fever  . Emesis    Lee Barnes is a 71 y.o. male.  The history is provided by the patient.  Fever Max temp prior to arrival:  103 Temp source:  Oral Severity:  Moderate Onset quality:  Gradual Duration:  1 day Timing:  Constant Progression:  Unchanged Chronicity:  New Relieved by:  Nothing Worsened by:  Nothing Ineffective treatments:  None tried Associated symptoms: vomiting   Associated symptoms comment:  Global weakness  Risk factors: no contaminated food   Emesis Severity:  Mild Timing:  Rare Number of daily episodes:  1 Progression:  Resolved Chronicity:  Recurrent Context: not post-tussive   Relieved by:  Nothing Worsened by:  Nothing Ineffective treatments:  None tried Associated symptoms: fever   Associated symptoms: no abdominal pain        Home Medications Prior to Admission medications   Medication Sig Start Date End Date Taking? Authorizing Provider  cetirizine (ZYRTEC) 10 MG chewable tablet Place '10mg'$  into feeding tube daily as needed for allergies   Yes [provider]  fluticasone (FLONASE) 50 MCG/ACT nasal spray SPRAY TWO SPRAYS IN EACH NOSTRIL ONCE DAILY AS NEEDED Patient taking differently: Place 2 sprays into both nostrils at bedtime. 06/05/22  Yes Denita Lung, MD  hydrocortisone cream 1 % Apply 1 application topically 2 (two) times daily as needed for itching.   Yes [provider]  midodrine (PROAMATINE) 2.5 MG tablet Place 2.'5mg'$  into feeding tube three times a day as needed for hypotension 06/18/22 06/13/23 Yes [provider]  Nutritional Supplements (ISOSOURCE 1.5 CAL) LIQD Place 375 mLs into feeding tube 4 (four) times daily. Flush with 50 mL free water before and after tube feeds.  Follow with PCP and nephrology to consider adjustment  of free water as needed based on sodium. 12/03/21  Yes Elodia Florence., MD      Allergies    Codeine    Review of Systems   Review of Systems  Constitutional:  Positive for fatigue and fever.  HENT:  Negative for facial swelling.   Eyes:  Negative for redness.  Gastrointestinal:  Positive for vomiting. Negative for abdominal pain.  All other systems reviewed and are negative.   Physical Exam Updated Vital Signs BP 113/73   Pulse 71   Temp 97.7 F (36.5 C) (Oral)   Resp 17   Ht 6' (1.829 m)   Wt 72.6 kg   SpO2 94%   BMI 21.70 kg/m  Physical Exam Vitals and nursing note reviewed.  Constitutional:      General: He is not in acute distress.    Appearance: He is well-developed. He is not diaphoretic.  HENT:     Head: Normocephalic and atraumatic.     Nose: Nose normal.  Eyes:     Conjunctiva/sclera: Conjunctivae normal.     Pupils: Pupils are equal, round, and reactive to light.  Cardiovascular:     Rate and Rhythm: Normal rate and regular rhythm.     Pulses: Normal pulses.     Heart sounds: Normal heart sounds.  Pulmonary:     Effort: Pulmonary effort is normal.     Breath sounds: Rales present. No wheezing.  Abdominal:     General: Bowel sounds are normal.     Palpations: Abdomen is soft.  Tenderness: There is no abdominal tenderness. There is no guarding or rebound.  Musculoskeletal:        General: Normal range of motion.     Cervical back: Normal range of motion and neck supple.  Skin:    General: Skin is warm and dry.     Capillary Refill: Capillary refill takes less than 2 seconds.  Neurological:     General: No focal deficit present.     Mental Status: He is alert and oriented to person, place, and time.    ED Results / Procedures / Treatments   Labs (all labs ordered are listed, but only abnormal results are displayed) Results for orders placed or performed during the hospital encounter of 06/25/22  Resp Panel by RT-PCR (Flu A&B, Covid)  Anterior Nasal Swab   Specimen: Anterior Nasal Swab  Result Value Ref Range   SARS Coronavirus 2 by RT PCR NEGATIVE NEGATIVE   Influenza A by PCR NEGATIVE NEGATIVE   Influenza B by PCR NEGATIVE NEGATIVE  CBC with Differential  Result Value Ref Range   WBC 13.5 (H) 4.0 - 10.5 K/uL   RBC 4.74 4.22 - 5.81 MIL/uL   Hemoglobin 13.9 13.0 - 17.0 g/dL   HCT 41.3 39.0 - 52.0 %   MCV 87.1 80.0 - 100.0 fL   MCH 29.3 26.0 - 34.0 pg   MCHC 33.7 30.0 - 36.0 g/dL   RDW 14.7 11.5 - 15.5 %   Platelets 197 150 - 400 K/uL   nRBC 0.0 0.0 - 0.2 %   Neutrophils Relative % 88 %   Neutro Abs 11.8 (H) 1.7 - 7.7 K/uL   Lymphocytes Relative 3 %   Lymphs Abs 0.4 (L) 0.7 - 4.0 K/uL   Monocytes Relative 9 %   Monocytes Absolute 1.2 (H) 0.1 - 1.0 K/uL   Eosinophils Relative 0 %   Eosinophils Absolute 0.0 0.0 - 0.5 K/uL   Basophils Relative 0 %   Basophils Absolute 0.0 0.0 - 0.1 K/uL   Immature Granulocytes 0 %   Abs Immature Granulocytes 0.05 0.00 - 0.07 K/uL  Comprehensive metabolic panel  Result Value Ref Range   Sodium 129 (L) 135 - 145 mmol/L   Potassium 5.1 3.5 - 5.1 mmol/L   Chloride 94 (L) 98 - 111 mmol/L   CO2 25 22 - 32 mmol/L   Glucose, Bld 123 (H) 70 - 99 mg/dL   BUN 29 (H) 8 - 23 mg/dL   Creatinine, Ser 1.06 0.61 - 1.24 mg/dL   Calcium 9.5 8.9 - 10.3 mg/dL   Total Protein 8.0 6.5 - 8.1 g/dL   Albumin 4.2 3.5 - 5.0 g/dL   AST 27 15 - 41 U/L   ALT 28 0 - 44 U/L   Alkaline Phosphatase 107 38 - 126 U/L   Total Bilirubin 0.5 0.3 - 1.2 mg/dL   GFR, Estimated >60 >60 mL/min   Anion gap 10 5 - 15  Lactic acid, plasma  Result Value Ref Range   Lactic Acid, Venous 1.1 0.5 - 1.9 mmol/L  Protime-INR  Result Value Ref Range   Prothrombin Time 14.2 11.4 - 15.2 seconds   INR 1.1 0.8 - 1.2  APTT  Result Value Ref Range   aPTT 26 24 - 36 seconds   DG Chest 2 View  Result Date: 06/25/2022 CLINICAL DATA:  Cough EXAM: CHEST - 2 VIEW COMPARISON:  11/29/2021 FINDINGS: Stable elevation of the right  hemidiaphragm. Sparse patchy infiltrate at the left lung base has  developed, possibly infectious or inflammatory. No pneumothorax or pleural effusion. Cardiac size within normal limits. Pulmonary vascularity is normal. No acute bone abnormality. IMPRESSION: Interval development of left basilar infiltrate, possibly infectious or inflammatory. Stable right diaphragmatic elevation. Electronically Signed   By: Fidela Salisbury M.D.   On: 06/25/2022 23:12     EKG EKG Interpretation  Date/Time:  Wednesday June 26 2022 01:19:43 EDT Ventricular Rate:  78 PR Interval:  188 QRS Duration: 86 QT Interval:  375 QTC Calculation: 428 R Axis:   193 Text Interpretation: Sinus rhythm Probable lateral infarct, age indeterminate Confirmed by Dory Horn) on 06/26/2022 2:44:29 AM  Radiology DG Chest 2 View  Result Date: 06/25/2022 CLINICAL DATA:  Cough EXAM: CHEST - 2 VIEW COMPARISON:  11/29/2021 FINDINGS: Stable elevation of the right hemidiaphragm. Sparse patchy infiltrate at the left lung base has developed, possibly infectious or inflammatory. No pneumothorax or pleural effusion. Cardiac size within normal limits. Pulmonary vascularity is normal. No acute bone abnormality. IMPRESSION: Interval development of left basilar infiltrate, possibly infectious or inflammatory. Stable right diaphragmatic elevation. Electronically Signed   By: Fidela Salisbury M.D.   On: 06/25/2022 23:12    Procedures Procedures    Medications Ordered in ED Medications  lactated ringers infusion ( Intravenous New Bag/Given 06/26/22 0238)  vancomycin (VANCOCIN) IVPB 1000 mg/200 mL premix (1,000 mg Intravenous New Bag/Given 06/26/22 0241)  vancomycin (VANCOREADY) IVPB 1500 mg/300 mL (has no administration in time range)  ceFEPIme (MAXIPIME) 2 g in sodium chloride 0.9 % 100 mL IVPB (has no administration in time range)  ceFEPIme (MAXIPIME) 2 g in sodium chloride 0.9 % 100 mL IVPB (0 g Intravenous Stopped 06/26/22 0238)  lactated  ringers bolus 500 mL (0 mLs Intravenous Stopped 06/26/22 0237)    ED Course/ Medical Decision Making/ A&P                           Medical Decision Making Patient with global weakness and GERD symptoms and one episode of emesis and then fever of 103   Amount and/or Complexity of Data Reviewed Independent Historian: spouse    Details: see above External Data Reviewed: notes.    Details: previous notes Labs: ordered.    Details: all labs reviewed:  elevated white count 13.5 normal hemoglobin and platelet counts.  Low sodium 129 and elevated BUN 29 normal creatinine.  Normal coagulation strudies.  Normal lactate. UA negative for UTI Radiology: ordered and independent interpretation performed.    Details: PNA by me ECG/medicine tests: ordered. Discussion of management or test interpretation with external provider(s): Dr. Bridgett Larsson of [facility]   Risk Prescription drug management. Decision regarding hospitalization. Risk Details: Port score 100 will admit. The patient appears reasonably stabilized for admission considering the current resources, flow, and capabilities available in the ED at this time, and I doubt any other [facility] requiring further screening and/or treatment in the ED prior to admission.   Critical Care Total time providing critical care: 30 minutes (Sepsis bundle initiated with multiple antibiotics )   Final Clinical Impression(s) / ED Diagnoses Final diagnoses:  HCAP (healthcare-associated pneumonia)    Rx / DC Orders ED Discharge Orders     None         Afua Hoots, MD 06/26/22 3299

## 2022-06-26 NOTE — H&P (Signed)
History and Physical    Patient: Lee Barnes DOB: [birth year] DOA: 06/25/2022 DOS: the patient was seen and examined on 06/26/2022 PCP: Ronnald Nian, MD  Patient coming from: Home  Chief Complaint:  Chief Complaint  Patient presents with   Fever   Emesis   HPI: Lee Barnes is a 71 y.o. male with medical history significant of seasonal allergies, diverticulosis, tinnitus, oropharyngeal cancer of the tongue, history of chemotherapy, history of radiation therapy, PEG tube placement, history of aspiration pneumonia who was brought into the emergency department due to fever, fatigue and malaise following vomiting due to recent gastroesophageal reflux.  The patient and his wife stated that he has been having a lot more issues with reflux recently, particularly after the second or subsequent feeds of the day. He denied  rhinorrhea, wheezing or hemoptysis.  No chest pain, palpitations, diaphoresis, PND, orthopnea or pitting edema of the lower extremities.  No abdominal pain,  constipation, melena or hematochezia.  He gets frequent diarrhea.  No flank pain, dysuria, frequency or hematuria.  No polyuria, polydipsia, polyphagia or blurred vision.   ED course: Initial vital signs were temperature 98.1 F, pulse 71, respirations 16, BP 93/72 mmHg O2 sat 95% on room air.  The patient received cefepime, vancomycin and 500 mL of LR bolus.  Lab work: His urinalysis showed rare bacteria but was otherwise normal.  CBC with a white count of 13.5, hemoglobin 13.9 g/dL platelets 401.  Normal PT, INR, PTT, COVID-19/influenza PCR and lactic acid.  CMP showed a sodium 129 and chloride 94 mmol/L.  Glucose 123 and BUN 29 mg deciliter.  The rest of the CMP values were normal.  Imaging: A 2 view chest radiograph showed new interval development of left basilar infiltrate.  There was stable right diaphragmatic elevation.   Review of Systems: As mentioned in the history of present illness. All other  systems reviewed and are negative. Past Medical History:  Diagnosis Date   Allergy to environmental factors    Complication of anesthesia    "vageled" after surgery -overnite stay   Diverticulosis 2008   Heart murmur    hx of in childhood    History of chemotherapy    History of radiation therapy 11/05/10-12/26/10   r base tongue, 7000 cGy 35 sessions   Hypertension    Medication started in May 2016.   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   Tinnitus    Past Surgical History:  Procedure Laterality Date   APPENDECTOMY     COLONOSCOPY     INGUINAL HERNIA REPAIR Bilateral 03/30/2015   Procedure: LAPAROSCOPIC BILATERAL INGUINAL HERNIA REPAIR WITH MESH;  Surgeon: Abigail Miyamoto, MD;  Location: [facility];  Service: General;  Laterality: Bilateral;   INGUINAL HERNIA REPAIR Right 1960   INGUINAL HERNIA REPAIR Bilateral 03/30/2015   INGUINAL HERNIA REPAIR Left 07/11/2015   INGUINAL HERNIA REPAIR N/A 07/11/2015   Procedure: REPAIR OF LEFT INGUINAL HERNIA WITH MESH;  Surgeon: Abigail Miyamoto, MD;  Location: [facility];  Service: General;  Laterality: N/A;   INSERTION OF MESH N/A 07/11/2015   Procedure: INSERTION OF MESH;  Surgeon: Abigail Miyamoto, MD;  Location: [facility];  Service: General;  Laterality: N/A;   IR REPLC GASTRO/COLONIC TUBE PERCUT W/FLUORO  12/03/2021   LUMBAR DISC SURGERY     PEG PLACEMENT N/A 03/22/2022   Procedure: PERCUTANEOUS ENDOSCOPIC GASTROSTOMY (PEG) REPLACEMENT;  Surgeon: Jeani Hawking, MD;  Location: [facility];  Service: Gastroenterology;  Laterality: N/A;   SAVORY DILATION N/A 03/22/2022   Procedure: SAVORY DILATION;  Surgeon: Jeani Hawking, MD;  Location: [facility];  Service: Gastroenterology;  Laterality: N/A;   TONSILLECTOMY     Social History:  reports that he has never smoked. He has never used smokeless tobacco. He reports current alcohol use. He reports that he does not use drugs.  Allergies   Allergen Reactions   Codeine Nausea And Vomiting    Family History  Problem Relation Age of Onset   Heart disease Father    Cancer Father        throat ca   Heart disease Mother    Colon cancer Neg Hx     Prior to Admission medications   Medication Sig Start Date End Date Taking? Authorizing Provider  cetirizine (ZYRTEC) 10 MG chewable tablet Place 10mg  into feeding tube daily as needed for allergies   Yes [provider]  fluticasone (FLONASE) 50 MCG/ACT nasal spray SPRAY TWO SPRAYS IN EACH NOSTRIL ONCE DAILY AS NEEDED Patient taking differently: Place 2 sprays into both nostrils at bedtime. 06/05/22  Yes Ronnald Nian, MD  hydrocortisone cream 1 % Apply 1 application topically 2 (two) times daily as needed for itching.   Yes [provider]  loperamide (IMODIUM) 2 MG capsule Place 68m into feeding tube daily as needed for diarrhea. Usually given during a feeding.   Yes [provider]  midodrine (PROAMATINE) 2.5 MG tablet Place 2.5mg  into feeding tube three times a day as needed for hypotension 06/18/22 06/13/23 Yes [provider]  Nutritional Supplements (ISOSOURCE 1.5 CAL) LIQD Place 375 mLs into feeding tube 4 (four) times daily. Flush with 50 mL free water before and after tube feeds.  Follow with PCP and nephrology to consider adjustment of free water as needed based on sodium. 12/03/21  Yes Zigmund Daniel., MD    Physical Exam: Vitals:   06/26/22 0930 06/26/22 1000 06/26/22 1115 06/26/22 1130  BP: (!) 184/99 (!) 173/97 (!) 176/106 (!) 169/104  Pulse: 84 85 86 86  Resp: 18 15 12 16   Temp:      TempSrc:      SpO2: 95% 95% 97% 96%  Weight:      Height:       Physical Exam Vitals and nursing note reviewed.  Constitutional:      General: He is awake.     Appearance: He is normal weight. He is ill-appearing.  HENT:     Head: Normocephalic.     Mouth/Throat:     Mouth: Mucous membranes are moist.  Eyes:     General: No scleral  icterus.    Pupils: Pupils are equal, round, and reactive to light.  Neck:     Vascular: No JVD.  Cardiovascular:     Rate and Rhythm: Normal rate and regular rhythm.     Heart sounds: S1 normal and S2 normal.  Pulmonary:     Effort: Pulmonary effort is normal. No accessory muscle usage.     Breath sounds: No stridor. Examination of the left-lower field reveals rales. Rales present. No wheezing or rhonchi.  Abdominal:     General: Bowel sounds are normal.     Palpations: Abdomen is soft.     Tenderness: There is no abdominal tenderness. There is no right CVA tenderness or left CVA tenderness.     Comments: PEG tube in place.  No surrounding  erythema, edema or discharge seen at stoma site.  Musculoskeletal:     Cervical back: Neck supple.     Right lower leg: No edema.     Left lower leg: No edema.  Skin:    General: Skin is warm and dry.  Neurological:     Mental Status: He is alert and oriented to person, place, and time.  Psychiatric:        Mood and Affect: Mood normal.        Behavior: Behavior normal. Behavior is cooperative.     Data Reviewed:  There are no new results to review at this time.  Assessment and Plan: Principal Problem:   Aspiration pneumonia (HCC) Admit to telemetry/observation. PRN supplemental oxygen. As needed bronchodilators. Discontinue cefepime and vancomycin. Begin Unasyn 3 g every 6 hours. Check sputum Gram stain, culture and sensitivity. Follow-up blood culture and sensitivity. Follow-up CBC and chemistry in the morning.  Active Problems:   Essential hypertension BP fluctuates widely. Most recently SBP rose to over 200 mmHg. Continue labetalol 20 mg IVP every 2 hours as needed. Monitor blood pressure closely.    Neurogenic orthostatic hypotension (HCC) Continue midodrine as needed.    Dysphagia Has also been having reflux. Begin pantoprazole 40 mg p.o. daily. Consult nutritional services.    Chronic hyponatremia Around  baseline. Avoids excess free water intake. Continue IV fluids. Monitor sodium level.    Allergic rhinitis Continue Flonase and antihistamine.    Advance Care Planning:   Code Status: Full Code   Consults:   Family Communication: His spouse was at bedside.  Severity of Illness: The appropriate patient status for this patient is OBSERVATION. Observation status is judged to be reasonable and necessary in order to provide the required intensity of service to ensure the patient's safety. The patient's presenting symptoms, physical exam findings, and initial radiographic and laboratory data in the context of their medical condition is felt to place them at decreased risk for further clinical deterioration. Furthermore, it is anticipated that the patient will be medically stable for discharge from the hospital within 2 midnights of admission.   Author: Bobette Mo, MD 06/26/2022 11:33 AM  For on call review [email or web address].   This document was prepared using Dragon voice recognition software and may contain some unintended transcription errors.

## 2022-06-26 NOTE — Progress Notes (Signed)
Initial Nutrition Assessment  DOCUMENTATION CODES:   Non-severe (moderate) malnutrition in context of chronic illness  INTERVENTION:  - 375 ml (1.5 cartons) Isosource 1.5 QID with 50 ml free water before and 50 ml free water after each bolus while receiving IV fluids.  - this regimen will provide 2250 kcal, 23 grams fiber, 102 grams protein, and 1546 ml free water.   NUTRITION DIAGNOSIS:   Moderate Malnutrition related to chronic illness, cancer and cancer related treatments as evidenced by moderate fat depletion, moderate muscle depletion.  GOAL:   Patient will meet greater than or equal to 90% of their needs  MONITOR:   TF tolerance, Weight trends, Labs  REASON FOR ASSESSMENT:   Consult Enteral/tube feeding initiation and management  ASSESSMENT:   71 y.o. male with medical history of seasonal allergies, diverticulosis, tinnitus, oropharyngeal cancer of the tongue s/p chemo and XRT, PEG tube placement, hx of aspiration pneumonia, diverticulosis. He presented to the ED due to fever, fatigue, and malaise following vomiting d/t recent GERD, especially with feedings in the evening. Patient was admitted due to concern for aspiration PNA.  Patient sitting up in bed with wife at bedside. Both contribute to the conversation. At home patient uses gravity to bolus 375 ml (1.5 cartons) Isosource 1.5 QID (0800, 1200, 1600, 2000) with ~200 ml water/bolus. He has also recently been bolusing via gravity a bottle of gatorade or pedialyte daily.  Patient is chronically hyponatremic and a typical serum Na for him is 129 mmol/l.   He is unable to take anything by mouth; all nutrition and medications are via tube. Patient shares that for the past ~1.5 weeks he has been experiencing reflux which is new to him. He also shares that in the evening he notices that feedings infuse slower and that sometimes he feels very full; he does not experience this with the first 2 feedings of the day.   He  ensures that Isosource is room temperature or slightly warm at the time of administration. Discussed appropriate positioning for at least 1 hour after feeding administration and moved bed to 30 degrees so patient could sense how this feels. He often is sitting up in this fashion after feeds.  He does share that reflux occurs after taking naps or sleeping.   Weight yesterday was documented as 160 lb and weight on 5/24 was 166 lb. This indicates 6 lb weight loss (3.6% body weight) in the past 2.5 months; not significant for time frame.    Labs reviewed; Na: 129 mmol/l, Cl: 94 mmol/l, BUN: 29 mg/dl. Medications reviewed; imodium PRN, 40 mg protonix per tube/day.  IVF; LR @ 100 ml/hr.     NUTRITION - FOCUSED PHYSICAL EXAM:  Flowsheet Row Most Recent Value  Orbital Region Moderate depletion  Upper Arm Region Severe depletion  Thoracic and Lumbar Region Unable to assess  Buccal Region Moderate depletion  Temple Region Moderate depletion  Clavicle Bone Region Moderate depletion  Clavicle and Acromion Bone Region Severe depletion  Scapular Bone Region Moderate depletion  Dorsal Hand Moderate depletion  Patellar Region Moderate depletion  Anterior Thigh Region Moderate depletion  Posterior Calf Region Moderate depletion  Edema (RD Assessment) None  Hair Reviewed  Eyes Reviewed  Mouth Reviewed  Skin Reviewed  Nails Reviewed       Diet Order:   Diet Order             Diet NPO time specified  Diet effective now  EDUCATION NEEDS:   Education needs have been addressed  Skin:  Skin Assessment: Reviewed RN Assessment  Last BM:  PTA/unknown  Height:   Ht Readings from Last 1 Encounters:  06/25/22 6' (1.829 m)    Weight:   Wt Readings from Last 1 Encounters:  06/25/22 72.6 kg     BMI:  Body mass index is 21.7 kg/m.  Estimated Nutritional Needs:  Kcal:  0762-2633 kcal Protein:  110-125 grams Fluid:  >/= 2.2 L/day     Lee Matin,  MS, RD, LDN, CNSC Registered Dietitian II Inpatient Clinical Nutrition RD pager # and on-call/weekend pager # available in [facility]

## 2022-06-26 NOTE — ED Notes (Signed)
Patient reports he has problems with low and high bp and has meds for bother.  Messaged MD and got PRN for labetolol

## 2022-06-26 NOTE — Sepsis Progress Note (Signed)
Sepsis protocol is being followed by [facility].

## 2022-06-27 DIAGNOSIS — K219 Gastro-esophageal reflux disease without esophagitis: Secondary | ICD-10-CM

## 2022-06-27 DIAGNOSIS — J690 Pneumonitis due to inhalation of food and vomit: Secondary | ICD-10-CM | POA: Diagnosis not present

## 2022-06-27 DIAGNOSIS — E440 Moderate protein-calorie malnutrition: Secondary | ICD-10-CM | POA: Insufficient documentation

## 2022-06-27 DIAGNOSIS — Z789 Other specified health status: Secondary | ICD-10-CM

## 2022-06-27 LAB — COMPREHENSIVE METABOLIC PANEL
ALT: 21 U/L (ref 0–44)
AST: 20 U/L (ref 15–41)
Albumin: 3.6 g/dL (ref 3.5–5.0)
Alkaline Phosphatase: 93 U/L (ref 38–126)
Anion gap: 9 (ref 5–15)
BUN: 24 mg/dL — ABNORMAL HIGH (ref 8–23)
CO2: 27 mmol/L (ref 22–32)
Calcium: 8.8 mg/dL — ABNORMAL LOW (ref 8.9–10.3)
Chloride: 95 mmol/L — ABNORMAL LOW (ref 98–111)
Creatinine, Ser: 0.86 mg/dL (ref 0.61–1.24)
GFR, Estimated: >60 mL/min (ref >60)
Glucose, Bld: 89 mg/dL (ref 70–99)
Potassium: 4.7 mmol/L (ref 3.5–5.1)
Sodium: 131 mmol/L — ABNORMAL LOW (ref 135–145)
Total Bilirubin: 0.4 mg/dL (ref 0.3–1.2)
Total Protein: 7.3 g/dL (ref 6.5–8.1)

## 2022-06-27 LAB — CBC WITH DIFFERENTIAL/PLATELET
Abs Immature Granulocytes: 0.02 10*3/uL (ref 0.00–0.07)
Basophils Absolute: 0 10*3/uL (ref 0.0–0.1)
Basophils Relative: 0 %
Eosinophils Absolute: 0.2 10*3/uL (ref 0.0–0.5)
Eosinophils Relative: 2 %
HCT: 37.3 % — ABNORMAL LOW (ref 39.0–52.0)
Hemoglobin: 12.1 g/dL — ABNORMAL LOW (ref 13.0–17.0)
Immature Granulocytes: 0 %
Lymphocytes Relative: 7 %
Lymphs Abs: 0.5 10*3/uL — ABNORMAL LOW (ref 0.7–4.0)
MCH: 29 pg (ref 26.0–34.0)
MCHC: 32.4 g/dL (ref 30.0–36.0)
MCV: 89.4 fL (ref 80.0–100.0)
Monocytes Absolute: 0.9 10*3/uL (ref 0.1–1.0)
Monocytes Relative: 11 %
Neutro Abs: 6.1 10*3/uL (ref 1.7–7.7)
Neutrophils Relative %: 80 %
Platelets: 182 10*3/uL (ref 150–400)
RBC: 4.17 MIL/uL — ABNORMAL LOW (ref 4.22–5.81)
RDW: 14.7 % (ref 11.5–15.5)
WBC: 7.8 10*3/uL (ref 4.0–10.5)
nRBC: 0 % (ref 0.0–0.2)

## 2022-06-27 LAB — URINE CULTURE
Culture: NO GROWTH
Special Requests: NORMAL

## 2022-06-27 LAB — HIV ANTIBODY (ROUTINE TESTING W REFLEX): HIV Screen 4th Generation wRfx: NONREACTIVE

## 2022-06-27 LAB — GLUCOSE, CAPILLARY
Glucose-Capillary: 90 mg/dL (ref 70–99)
Glucose-Capillary: 115 mg/dL — ABNORMAL HIGH (ref 70–99)

## 2022-06-27 MED ORDER — AMOXICILLIN-POT CLAVULANATE 875-125 MG PO TABS
1.0000 | ORAL_TABLET | Freq: Two times a day (BID) | ORAL | 0 refills | Status: AC
Start: 2022-06-27 — End: 2022-07-04

## 2022-06-27 MED ORDER — PANTOPRAZOLE SODIUM 40 MG PO PACK: 40.0000 mg | PACK | Freq: Every day | ORAL | 1 refills | Status: DC

## 2022-06-27 MED ORDER — NYSTATIN 100000 UNIT/ML MT SUSP: 5.0000 mL | Freq: Four times a day (QID) | OROMUCOSAL | 0 refills | Status: DC

## 2022-06-27 NOTE — TOC Initial Note (Signed)
Transition of Care [facility]) - Initial/Assessment Note    Patient Details  Name: Lee Barnes MRN: [id number] Date of Birth: 05-05-1951  Transition of [facility]) CM/SW Contact:    Golda Acre, RN Phone Number: 06/27/2022, 9:06 AM  Clinical Narrative:                  Transition of Care [facility]) Screening Note   Patient Details  Name: Lee Barnes Date of Birth: January 30, 1951   Transition of Care [facility]) CM/SW Contact:    Golda Acre, RN Phone Number: 06/27/2022, 9:06 AM    Transition of Care Department [facility]) has reviewed patient and no TOC needs have been identified at this time. We will continue to monitor patient advancement through interdisciplinary progression rounds. If new patient transition needs arise, please place a TOC consult.    Expected Discharge Plan: Home/Self Care Barriers to Discharge: Continued Medical Work up   Patient Goals and CMS Choice Patient states their goals for this hospitalization and ongoing recovery are:: to go back home CMS [email or web address] Compare Post Acute Care list provided to:: Patient Choice offered to / list presented to : Patient  Expected Discharge Plan and Services Expected Discharge Plan: Home/Self Care   Discharge Planning Services: CM Consult   Living arrangements for the past 2 months: Single Family Home                                      Prior Living Arrangements/Services Living arrangements for the past 2 months: Single Family Home Lives with:: Spouse Patient language and need for interpreter reviewed:: Yes Do you feel safe going back to the place where you live?: Yes            Criminal Activity/Legal Involvement Pertinent to Current Situation/Hospitalization: No - Comment as needed  Activities of Daily Living Home Assistive Devices/Equipment: None ADL Screening (condition at time of admission) Patient's cognitive ability adequate to safely complete daily activities?: Yes Is the patient  deaf or have difficulty hearing?: No Does the patient have difficulty seeing, even when wearing glasses/contacts?: No Does the patient have difficulty concentrating, remembering, or making decisions?: No Patient able to express need for assistance with ADLs?: Yes Does the patient have difficulty dressing or bathing?: No Independently performs ADLs?: Yes (appropriate for developmental age) Does the patient have difficulty walking or climbing stairs?: No Weakness of Legs: None Weakness of Arms/Hands: None  Permission Sought/Granted                  Emotional Assessment Appearance:: Appears stated age Attitude/Demeanor/Rapport: Engaged Affect (typically observed): Calm Orientation: : Oriented to Self, Oriented to Place, Oriented to  Time, Oriented to Situation Alcohol / Substance Use: Alcohol Use Psych Involvement: No (comment)  Admission diagnosis:  Aspiration pneumonia (HCC) [J69.0] HCAP (healthcare-associated pneumonia) [J18.9] Patient Active Problem List   Diagnosis Date Noted   Malnutrition of moderate degree 06/27/2022   Aspiration pneumonia (HCC) 06/26/2022   Chronic hyponatremia 02/20/2021   Dysphagia 12/12/2020   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

## 2022-06-27 NOTE — TOC Transition Note (Signed)
Transition of Care [facility]) - CM/SW Discharge Note   Patient Details  Name: Lee Barnes MRN: [id number] Date of Birth: [birth year]  Transition [facility]) CM/SW Contact:  [name], RN Phone Number: 06/27/2022, 10:58 AM   Clinical Narrative:    Pt dcd to go back home with self care.   Final next level of care: Home/Self Care Barriers to Discharge: Barriers Resolved   Patient Goals and CMS Choice Patient states their goals for this hospitalization and ongoing recovery are:: to go back home CMS Medicare.gov Compare Post Acute Care list provided to:: Patient Choice offered to / list presented to : Patient  Discharge Placement                       Discharge Plan and Services   Discharge Planning Services: CM Consult                                 Social Determinants of Health (SDOH) Interventions     Readmission Risk Interventions     No data to display

## 2022-06-27 NOTE — Discharge Summary (Signed)
Physician Discharge Summary  Lee Barnes JWJ:[id number] DOB: 08/12/1951 DOA: 06/25/2022  PCP: Ronnald Nian, MD  Admit date: 06/25/2022 Discharge date: 06/27/2022  Admitted From: Home Disposition:  Home  Recommendations for Outpatient Follow-up:  Follow up with PCP in 1 week Follow up with Dr. Elnoria Howard, GI, as scheduled later this month  Discharge Condition: Stable CODE STATUS: Full  Diet recommendation: NPO. TF only.   Brief/Interim Summary: From H&P by Dr. Robb Matar: "Lee Barnes is a 71 y.o. male with medical history significant of seasonal allergies, diverticulosis, tinnitus, oropharyngeal cancer of the tongue, history of chemotherapy, history of radiation therapy, PEG tube placement, history of aspiration pneumonia who was brought into the emergency department due to fever, fatigue and malaise following vomiting due to recent gastroesophageal reflux.  The patient and his wife stated that he has been having a lot more issues with reflux recently, particularly after the second or subsequent feeds of the day. He denied  rhinorrhea, wheezing or hemoptysis.  No chest pain, palpitations, diaphoresis, PND, orthopnea or pitting edema of the lower extremities.  No abdominal pain,  constipation, melena or hematochezia.  He gets frequent diarrhea.  No flank pain, dysuria, frequency or hematuria.  No polyuria, polydipsia, polyphagia or blurred vision.    ED course: Initial vital signs were temperature 98.1 F, pulse 71, respirations 16, BP 93/72 mmHg O2 sat 95% on room air.  The patient received cefepime, vancomycin and 500 mL of LR bolus.   Lab work: His urinalysis showed rare bacteria but was otherwise normal.  CBC with a white count of 13.5, hemoglobin 13.9 g/dL platelets 621.  Normal PT, INR, PTT, COVID-19/influenza PCR and lactic acid.  CMP showed a sodium 129 and chloride 94 mmol/L.  Glucose 123 and BUN 29 mg deciliter.  The rest of the CMP values were normal.   Imaging: A 2 view chest  radiograph showed new interval development of left basilar infiltrate.  There was stable right diaphragmatic elevation."  Patient was admitted for aspiration pneumonia and started on Unasyn.  He was also given PPI for GERD.  On day of discharge, patient was feeling back to his baseline, without respiratory distress, stable on room air.  Patient, wife, and I discussed aspiration precautions, and to follow-up with GI for further reflux treatment plan.  Discharge Diagnoses:   Principal Problem:   Aspiration pneumonia (HCC) Active Problems:   History of oropharyngeal cancer   Allergic rhinitis   Essential hypertension   Neurogenic orthostatic hypotension (HCC)   Dysphagia   Chronic hyponatremia   Malnutrition of moderate degree   GERD (gastroesophageal reflux disease)   On tube feeding diet    Discharge Instructions  Discharge Instructions     Call MD for:  difficulty breathing, headache or visual disturbances   Complete by: As directed    Call MD for:  extreme fatigue   Complete by: As directed    Call MD for:  hives   Complete by: As directed    Call MD for:  persistant dizziness or light-headedness   Complete by: As directed    Call MD for:  persistant nausea and vomiting   Complete by: As directed    Call MD for:  severe uncontrolled pain   Complete by: As directed    Call MD for:  temperature >100.4   Complete by: As directed    Discharge instructions   Complete by: As directed    You were cared for by a hospitalist during your hospital  stay. If you have any questions about your discharge medications or the care you received while you were in the hospital after you are discharged, you can call the unit and ask to speak with the hospitalist on call if the hospitalist that took care of you is not available. Once you are discharged, your primary care physician will handle any further medical issues. Please note that NO REFILLS for any discharge medications will be authorized  once you are discharged, as it is imperative that you return to your primary care physician (or establish a relationship with a primary care physician if you do not have one) for your aftercare needs so that they can reassess your need for medications and monitor your lab values.   Increase activity slowly   Complete by: As directed       Allergies as of 06/27/2022       Reactions   Codeine Nausea And Vomiting        Medication List     TAKE these medications    amoxicillin-clavulanate 875-125 MG tablet Commonly known as: AUGMENTIN Place 1 tablet into feeding tube 2 (two) times daily for 7 days.   cetirizine 10 MG chewable tablet Commonly known as: ZYRTEC Place 10mg  into feeding tube daily as needed for allergies   fluticasone 50 MCG/ACT nasal spray Commonly known as: FLONASE SPRAY TWO SPRAYS IN EACH NOSTRIL ONCE DAILY AS NEEDED What changed:  how much to take how to take this when to take this additional instructions   hydrocortisone cream 1 % Apply 1 application topically 2 (two) times daily as needed for itching.   Isosource 1.5 Cal Liqd Place 375 mLs into feeding tube 4 (four) times daily. Flush with 50 mL free water before and after tube feeds.  Follow with PCP and nephrology to consider adjustment of free water as needed based on sodium.   loperamide 2 MG capsule Commonly known as: IMODIUM Place 3m into feeding tube daily as needed for diarrhea. Usually given during a feeding.   midodrine 2.5 MG tablet Commonly known as: PROAMATINE Place 2.5mg  into feeding tube three times a day as needed for hypotension   nystatin 100000 UNIT/ML suspension Commonly known as: MYCOSTATIN Use as directed 5 mLs (500,000 Units total) in the mouth or throat 4 (four) times daily.   pantoprazole sodium 40 mg Commonly known as: PROTONIX Place 40 mg into feeding tube daily.        Follow-up Information     Ronnald Nian, MD Follow up.   Specialty: Family Medicine Contact  information: [address] [place] [address] [phone]         Jeani Hawking, MD Follow up.   Specialty: Gastroenterology Contact information: [address] [place] [address] [phone]                Allergies  Allergen Reactions   Codeine Nausea And Vomiting    Consultations: None    Procedures/Studies: DG Chest 2 View  Result Date: 06/25/2022 CLINICAL DATA:  Cough EXAM: CHEST - 2 VIEW COMPARISON:  11/29/2021 FINDINGS: Stable elevation of the right hemidiaphragm. Sparse patchy infiltrate at the left lung base has developed, possibly infectious or inflammatory. No pneumothorax or pleural effusion. Cardiac size within normal limits. Pulmonary vascularity is normal. No acute bone abnormality. IMPRESSION: Interval development of left basilar infiltrate, possibly infectious or inflammatory. Stable right diaphragmatic elevation. Electronically Signed   By: Helyn Numbers M.D.   On: 06/25/2022 23:12  Discharge Exam: Vitals:   06/26/22 2341 06/27/22 0628  BP: (!) 160/93 (!) 158/98  Pulse: 73 77  Resp:  18  Temp: 98.2 F (36.8 C) 98.2 F (36.8 C)  SpO2: 97% 95%    General: Pt is alert, awake, not in acute distress Cardiovascular: RRR, S1/S2 +, no edema Respiratory: CTA bilaterally, no wheezing, no rhonchi, no respiratory distress, no conversational dyspnea, on room air  Abdominal: Soft, NT, ND, bowel sounds + Extremities: no edema, no cyanosis Psych: Normal mood and affect, stable judgement and insight     The results of significant diagnostics from this hospitalization (including imaging, microbiology, ancillary and laboratory) are listed below for reference.     Microbiology: Recent Results (from the past 240 hour(s))  Resp Panel by RT-PCR (Flu A&B, Covid) Anterior Nasal Swab     Status: None   Collection Time: 06/26/22 12:53 AM   Specimen: Anterior Nasal Swab  Result Value Ref Range Status   SARS Coronavirus 2  by RT PCR NEGATIVE NEGATIVE Final    Comment: (NOTE) SARS-CoV-2 target nucleic acids are NOT DETECTED.  The SARS-CoV-2 RNA is generally detectable in upper respiratory specimens during the acute phase of infection. The lowest concentration of SARS-CoV-2 viral copies this assay can detect is 138 copies/mL. A negative result does not preclude SARS-Cov-2 infection and should not be used as the sole basis for treatment or other patient management decisions. A negative result may occur with  improper specimen collection/handling, submission of specimen other than nasopharyngeal swab, presence of viral mutation(s) within the areas targeted by this assay, and inadequate number of viral copies(<138 copies/mL). A negative result must be combined with clinical observations, patient history, and epidemiological information. The expected result is Negative.  Fact Sheet for Patients:  BloggerCourse.com  Fact Sheet for Healthcare Providers:  SeriousBroker.it  This test is no t yet approved or cleared by the [place] FDA and  has been authorized for detection and/or diagnosis of SARS-CoV-2 by FDA under an Emergency Use Authorization (EUA). This EUA will remain  in effect (meaning this test can be used) for the duration of the COVID-19 declaration under Section 564(b)(1) of the Act, 21 U.S.C.section 360bbb-3(b)(1), unless the authorization is terminated  or revoked sooner.       Influenza A by PCR NEGATIVE NEGATIVE Final   Influenza B by PCR NEGATIVE NEGATIVE Final    Comment: (NOTE) The Xpert Xpress SARS-CoV-2/FLU/RSV plus assay is intended as an aid in the diagnosis of influenza from Nasopharyngeal swab specimens and should not be used as a sole basis for treatment. Nasal washings and aspirates are unacceptable for Xpert Xpress SARS-CoV-2/FLU/RSV testing.  Fact Sheet for Patients: BloggerCourse.com  Fact  Sheet for Healthcare Providers: SeriousBroker.it  This test is not yet approved or cleared by the [place] FDA and has been authorized for detection and/or diagnosis of SARS-CoV-2 by FDA under an Emergency Use Authorization (EUA). This EUA will remain in effect (meaning this test can be used) for the duration of the COVID-19 declaration under Section 564(b)(1) of the Act, 21 U.S.C. section 360bbb-3(b)(1), unless the authorization is terminated or revoked.  Performed at [facility], [address] [place], [place] [address]   Blood Culture (routine x 2)     Status: None (Preliminary result)   Collection Time: 06/26/22 12:58 AM   Specimen: BLOOD  Result Value Ref Range Status   Specimen Description   Final    BLOOD SITE NOT SPECIFIED Performed at [facility], [address] [place], [place] [address]    Special Requests   Final    BOTTLES DRAWN AEROBIC AND ANAEROBIC Blood Culture adequate volume Performed at [facility], [address] [place], [place] [address]    Culture   Final    NO GROWTH < 24 HOURS Performed at [facility], [address] [place], [place] [address]    Report Status PENDING  Incomplete  Blood Culture (routine x 2)     Status: None (Preliminary result)   Collection Time: 06/26/22  1:13 AM   Specimen: BLOOD  Result Value Ref Range Status   Specimen Description   Final    BLOOD BLOOD LEFT HAND Performed at [facility], [address] [place], [place] [address]    Special Requests   Final    BOTTLES DRAWN AEROBIC ONLY Blood Culture adequate volume Performed at [facility], [address] [place], [place] [address]    Culture   Final    NO GROWTH 1 DAY Performed at [facility], [address] [place], [place] [address]    Report Status PENDING  Incomplete  Urine Culture     Status: None   Collection Time:  06/26/22  3:50 AM   Specimen: In/Out Cath Urine  Result Value Ref Range Status   Specimen Description   Final    IN/OUT CATH URINE Performed at [facility], [address], [place], [place] [address]    Special Requests   Final    Normal Performed at [facility], [address] [place], [place] [address]    Culture   Final    NO GROWTH Performed at [facility], [address], [place], [place] [address]    Report Status 06/27/2022 FINAL  Final     Labs: BNP (last 3 results) No results for input(s): "BNP" in the last 8760 hours. Basic Metabolic Panel: Recent Labs  Lab 06/25/22 2311 06/27/22 0307  NA 129* 131*  K 5.1 4.7  CL 94* 95*  CO2 25 27  GLUCOSE 123* 89  BUN 29* 24*  CREATININE 1.06 0.86  CALCIUM 9.5 8.8*   Liver Function Tests: Recent Labs  Lab 06/25/22 2311 06/27/22 0307  AST 27 20  ALT 28 21  ALKPHOS 107 93  BILITOT 0.5 0.4  PROT 8.0 7.3  ALBUMIN 4.2 3.6   No results for input(s): "LIPASE", "AMYLASE" in the last 168 hours. No results for input(s): "AMMONIA" in the last 168 hours. CBC: Recent Labs  Lab 06/25/22 2311 06/27/22 0307  WBC 13.5* 7.8  NEUTROABS 11.8* 6.1  HGB 13.9 12.1*  HCT 41.3 37.3*  MCV 87.1 89.4  PLT 197 182   Cardiac Enzymes: No results for input(s): "CKTOTAL", "CKMB", "CKMBINDEX", "TROPONINI" in the last 168 hours. BNP: Invalid input(s): "POCBNP" CBG: Recent Labs  Lab 06/26/22 2007 06/27/22 0747  GLUCAP 120* 90   D-Dimer No results for input(s): "DDIMER" in the last 72 hours. Hgb A1c No results for input(s): "HGBA1C" in the last 72 hours. Lipid Profile No results for input(s): "CHOL", "HDL", "LDLCALC", "TRIG", "CHOLHDL", "LDLDIRECT" in the last 72 hours. Thyroid function studies No results for input(s): "TSH", "T4TOTAL", "T3FREE", "THYROIDAB" in the last 72 hours.  Invalid input(s): "FREET3" Anemia work up No results for input(s): "VITAMINB12", "FOLATE",  "FERRITIN", "TIBC", "IRON", "RETICCTPCT" in the last 72 hours. Urinalysis    Component Value Date/Time   COLORURINE YELLOW 06/26/2022  0350   APPEARANCEUR CLEAR 06/26/2022 0350   LABSPEC 1.008 06/26/2022 0350   LABSPEC 1.015 12/13/2010 1028   PHURINE 7.0 06/26/2022 0350   GLUCOSEU NEGATIVE 06/26/2022 0350   HGBUR NEGATIVE 06/26/2022 0350   BILIRUBINUR NEGATIVE 06/26/2022 0350   BILIRUBINUR n 02/19/2016 1519   BILIRUBINUR Negative 12/13/2010 1028   KETONESUR NEGATIVE 06/26/2022 0350   PROTEINUR NEGATIVE 06/26/2022 0350   UROBILINOGEN negative 02/19/2016 1519   UROBILINOGEN 0.2 04/01/2015 2316   NITRITE NEGATIVE 06/26/2022 0350   LEUKOCYTESUR NEGATIVE 06/26/2022 0350   LEUKOCYTESUR Negative 12/13/2010 1028   Sepsis Labs Recent Labs  Lab 06/25/22 2311 06/27/22 0307  WBC 13.5* 7.8   Microbiology Recent Results (from the past 240 hour(s))  Resp Panel by RT-PCR (Flu A&B, Covid) Anterior Nasal Swab     Status: None   Collection Time: 06/26/22 12:53 AM   Specimen: Anterior Nasal Swab  Result Value Ref Range Status   SARS Coronavirus 2 by RT PCR NEGATIVE NEGATIVE Final    Comment: (NOTE) SARS-CoV-2 target nucleic acids are NOT DETECTED.  The SARS-CoV-2 RNA is generally detectable in upper respiratory specimens during the acute phase of infection. The lowest concentration of SARS-CoV-2 viral copies this assay can detect is 138 copies/mL. A negative result does not preclude SARS-Cov-2 infection and should not be used as the sole basis for treatment or other patient management decisions. A negative result may occur with  improper specimen collection/handling, submission of specimen other than nasopharyngeal swab, presence of viral mutation(s) within the areas targeted by this assay, and inadequate number of viral copies(<138 copies/mL). A negative result must be combined with clinical observations, patient history, and epidemiological information. The expected result is  Negative.  Fact Sheet for Patients:  BloggerCourse.com  Fact Sheet for Healthcare Providers:  SeriousBroker.it  This test is no t yet approved or cleared by the [place] FDA and  has been authorized for detection and/or diagnosis of SARS-CoV-2 by FDA under an Emergency Use Authorization (EUA). This EUA will remain  in effect (meaning this test can be used) for the duration of the COVID-19 declaration under Section 564(b)(1) of the Act, 21 U.S.C.section 360bbb-3(b)(1), unless the authorization is terminated  or revoked sooner.       Influenza A by PCR NEGATIVE NEGATIVE Final   Influenza B by PCR NEGATIVE NEGATIVE Final    Comment: (NOTE) The Xpert Xpress SARS-CoV-2/FLU/RSV plus assay is intended as an aid in the diagnosis of influenza from Nasopharyngeal swab specimens and should not be used as a sole basis for treatment. Nasal washings and aspirates are unacceptable for Xpert Xpress SARS-CoV-2/FLU/RSV testing.  Fact Sheet for Patients: BloggerCourse.com  Fact Sheet for Healthcare Providers: SeriousBroker.it  This test is not yet approved or cleared by the [place] FDA and has been authorized for detection and/or diagnosis of SARS-CoV-2 by FDA under an Emergency Use Authorization (EUA). This EUA will remain in effect (meaning this test can be used) for the duration of the COVID-19 declaration under Section 564(b)(1) of the Act, 21 U.S.C. section 360bbb-3(b)(1), unless the authorization is terminated or revoked.  Performed at [facility], [address], [place], [place] [address]   Blood Culture (routine x 2)     Status: None (Preliminary result)   Collection Time: 06/26/22 12:58 AM   Specimen: BLOOD  Result Value Ref Range Status   Specimen Description   Final    BLOOD SITE NOT SPECIFIED Performed at [facility], [address]  [place], [place] [address]    Special Requests   Final    BOTTLES DRAWN AEROBIC AND ANAEROBIC Blood Culture adequate volume Performed at [facility], [address], [place], [place] [address]    Culture   Final    NO GROWTH < 24 HOURS Performed at [facility], [address] [place], [place] [address]    Report Status PENDING  Incomplete  Blood Culture (routine x 2)     Status: None (Preliminary result)   Collection Time: 06/26/22  1:13 AM   Specimen: BLOOD  Result Value Ref Range Status   Specimen Description   Final    BLOOD BLOOD LEFT HAND Performed at [facility], [address] [place], [place] [address]    Special Requests   Final    BOTTLES DRAWN AEROBIC ONLY Blood Culture adequate volume Performed at [facility], [address], [place], [place] [address]    Culture   Final    NO GROWTH 1 DAY Performed at [facility], [address] [place], [place] [address]    Report Status PENDING  Incomplete  Urine Culture     Status: None   Collection Time: 06/26/22  3:50 AM   Specimen: In/Out Cath Urine  Result Value Ref Range Status   Specimen Description   Final    IN/OUT CATH URINE Performed at [facility], [address] [place], [place] [address]    Special Requests   Final    Normal Performed at [facility], [address] [place], [place] [address]    Culture   Final    NO GROWTH Performed at [facility], [address], [place], [place] [address]    Report Status 06/27/2022 FINAL  Final     Patient was seen and examined on the day of discharge and was found to be in stable condition. Time coordinating discharge: 40 minutes including assessment and coordination of care, as well as examination of the patient.   SIGNED:  Noralee Stain, DO [facility] 06/27/2022, 10:29 AM

## 2022-06-27 NOTE — Progress Notes (Signed)
Pt refused CBG monitoring at 12MN & 4 AM. Education about CBG monitoring given to the patient.

## 2022-06-28 ENCOUNTER — Telehealth: Payer: Self-pay

## 2022-06-28 MED ORDER — OMEPRAZOLE 40 MG PO CPDR
DELAYED_RELEASE_CAPSULE | ORAL | 3 refills | Status: DC
Start: 2022-06-28 — End: 2023-03-26

## 2022-06-28 MED ORDER — PANTOPRAZOLE SODIUM 40 MG PO PACK: 40.0000 mg | PACK | Freq: Every day | ORAL | 5 refills | Status: DC

## 2022-06-28 NOTE — Telephone Encounter (Signed)
Pharmacy called back to let us know the granules you calle din are not covered by his ins. But if you wanted to switch it to omperazole 40 mg capsules they can be opened up and put into his feeding tube per the pharmacists and that one was preferred by his ins. Company.

## 2022-06-28 NOTE — Telephone Encounter (Signed)
Transition Care Management Follow-up Telephone Call Date of discharge and from where: 06/27/22 [facility] How have you been since you were released from the hospital? Issues with medications Any questions or concerns? Yes  Items Reviewed: Did the pt receive and understand the discharge instructions provided? Yes  Medications obtained and verified? Yes  Other? Yes  separate message sent to Dr. Redmond School about medication.  Any new allergies since your discharge? No  Dietary orders reviewed? Yes Do you have support at home? Yes   Home Care and Equipment/Supplies: Were home health services ordered? no  Functional Questionnaire: (I = Independent and D = Dependent) ADLs: I  Bathing/Dressing- D  Meal Prep- D  Eating- D  Maintaining continence- D  Transferring/Ambulation- D  Managing Meds- D  Follow up appointments reviewed:  PCP Hospital f/u appt confirmed? Yes  Scheduled to see Dr. Redmond School on 07/09/22 @ 11:45. [facility] f/u appt confirmed? Yes  Scheduled to see Dr. Benson Norway on 07/08/22 @ 3. Are transportation arrangements needed? No  If their condition worsens, is the pt aware to call PCP or go to the Emergency Dept.? Yes Was the patient provided with contact information for the PCP's office or ED? Yes Was to pt encouraged to call back with questions or concerns? Yes

## 2022-06-28 NOTE — Telephone Encounter (Signed)
Pt. Wife called stating he was recently in the hospital again with Pneumonia. The doctor at the hospital called in Pantoprazole Sodium 40 mg solution because of his feeding tube. The pharmacy said it was denied due to not enough info about why he needed the solution pantoprazole. She wanted to know if you had any recommendations as far as what she could possibly give him that is equal to Pantoprazole 40 mg. If she could possibly crush a tablet or open up a capsule of one of the gerd medication to give to him. She already called the hospital back and they told her to call his PCP. She also called his GI doctor but he is doing procedures yesterday and today so they have not called her back with any recommendations.

## 2022-07-01 LAB — CULTURE, BLOOD (ROUTINE X 2)
Culture: NO GROWTH
Culture: NO GROWTH
Special Requests: ADEQUATE
Special Requests: ADEQUATE

## 2022-07-08 ENCOUNTER — Other Ambulatory Visit: Payer: Self-pay

## 2022-07-08 ENCOUNTER — Encounter (HOSPITAL_COMMUNITY): Payer: Self-pay

## 2022-07-08 ENCOUNTER — Emergency Department (HOSPITAL_COMMUNITY): Payer: Medicare Other

## 2022-07-08 ENCOUNTER — Emergency Department (HOSPITAL_COMMUNITY)
Admission: EM | Admit: 2022-07-08 | Discharge: 2022-07-08 | Disposition: A | Discharge status: Home / Self Care | Payer: Medicare Other | Attending: Emergency Medicine | Admitting: Emergency Medicine

## 2022-07-08 ENCOUNTER — Telehealth: Payer: Self-pay | Admitting: Family Medicine

## 2022-07-08 DIAGNOSIS — R531 Weakness: Secondary | ICD-10-CM | POA: Diagnosis not present

## 2022-07-08 DIAGNOSIS — Z85819 Personal history of malignant neoplasm of unspecified site of lip, oral cavity, and pharynx: Secondary | ICD-10-CM | POA: Diagnosis not present

## 2022-07-08 DIAGNOSIS — D72829 Elevated white blood cell count, unspecified: Secondary | ICD-10-CM | POA: Insufficient documentation

## 2022-07-08 DIAGNOSIS — R29810 Facial weakness: Secondary | ICD-10-CM | POA: Insufficient documentation

## 2022-07-08 DIAGNOSIS — J690 Pneumonitis due to inhalation of food and vomit: Secondary | ICD-10-CM | POA: Insufficient documentation

## 2022-07-08 DIAGNOSIS — J189 Pneumonia, unspecified organism: Secondary | ICD-10-CM | POA: Diagnosis not present

## 2022-07-08 DIAGNOSIS — J9811 Atelectasis: Secondary | ICD-10-CM | POA: Diagnosis not present

## 2022-07-08 LAB — CBC WITH DIFFERENTIAL/PLATELET
Abs Immature Granulocytes: 0.06 10*3/uL (ref 0.00–0.07)
Basophils Absolute: 0 10*3/uL (ref 0.0–0.1)
Basophils Relative: 0 %
Eosinophils Absolute: 0 10*3/uL (ref 0.0–0.5)
Eosinophils Relative: 0 %
HCT: 41.3 % (ref 39.0–52.0)
Hemoglobin: 13.7 g/dL (ref 13.0–17.0)
Immature Granulocytes: 0 %
Lymphocytes Relative: 2 %
Lymphs Abs: 0.3 10*3/uL — ABNORMAL LOW (ref 0.7–4.0)
MCH: 29.1 pg (ref 26.0–34.0)
MCHC: 33.2 g/dL (ref 30.0–36.0)
MCV: 87.7 fL (ref 80.0–100.0)
Monocytes Absolute: 0.9 10*3/uL (ref 0.1–1.0)
Monocytes Relative: 6 %
Neutro Abs: 13.8 10*3/uL — ABNORMAL HIGH (ref 1.7–7.7)
Neutrophils Relative %: 92 %
Platelets: 211 10*3/uL (ref 150–400)
RBC: 4.71 MIL/uL (ref 4.22–5.81)
RDW: 14.3 % (ref 11.5–15.5)
WBC: 15.1 10*3/uL — ABNORMAL HIGH (ref 4.0–10.5)
nRBC: 0 % (ref 0.0–0.2)

## 2022-07-08 LAB — URINALYSIS, ROUTINE W REFLEX MICROSCOPIC
Bilirubin Urine: NEGATIVE
Glucose, UA: NEGATIVE mg/dL
Hgb urine dipstick: NEGATIVE
Ketones, ur: NEGATIVE mg/dL
Leukocytes,Ua: NEGATIVE
Nitrite: NEGATIVE
Protein, ur: 30 mg/dL — AB
Specific Gravity, Urine: 1.019 (ref 1.005–1.030)
pH: 8 (ref 5.0–8.0)

## 2022-07-08 LAB — BASIC METABOLIC PANEL
Anion gap: 9 (ref 5–15)
BUN: 31 mg/dL — ABNORMAL HIGH (ref 8–23)
CO2: 27 mmol/L (ref 22–32)
Calcium: 9.6 mg/dL (ref 8.9–10.3)
Chloride: 96 mmol/L — ABNORMAL LOW (ref 98–111)
Creatinine, Ser: 1.04 mg/dL (ref 0.61–1.24)
GFR, Estimated: >60 mL/min (ref >60)
Glucose, Bld: 125 mg/dL — ABNORMAL HIGH (ref 70–99)
Potassium: 5.4 mmol/L — ABNORMAL HIGH (ref 3.5–5.1)
Sodium: 132 mmol/L — ABNORMAL LOW (ref 135–145)

## 2022-07-08 MED ORDER — SODIUM CHLORIDE 0.9 % IV BOLUS
500.0000 mL | Freq: Once | INTRAVENOUS | Status: AC
  Administered 2022-07-08: 500 mL via INTRAVENOUS

## 2022-07-08 MED ORDER — AMOXICILLIN-POT CLAVULANATE 875-125 MG PO TABS
1.0000 | ORAL_TABLET | Freq: Once | ORAL | Status: AC
  Administered 2022-07-08: 1 via ORAL
  Filled 2022-07-08: qty 1

## 2022-07-08 MED ORDER — AZITHROMYCIN 250 MG PO TABS: 250.0000 mg | ORAL_TABLET | Freq: Every day | ORAL | 0 refills | Status: AC

## 2022-07-08 MED ORDER — AZITHROMYCIN 250 MG PO TABS
500.0000 mg | ORAL_TABLET | Freq: Once | ORAL | Status: AC
  Administered 2022-07-08: 500 mg via ORAL
  Filled 2022-07-08: qty 2

## 2022-07-08 MED ORDER — AMOXICILLIN-POT CLAVULANATE 875-125 MG PO TABS: 1.0000 | ORAL_TABLET | Freq: Two times a day (BID) | ORAL | 0 refills | Status: AC

## 2022-07-08 NOTE — Telephone Encounter (Signed)
Pt's wife call and states that pt is experience weakness and reflux today. She states that he is experiencing that same symptoms when he had Pneumonemia. She also states he has an appt with Dr. Benson Norway today. JCL was notified and per JCL pt does not need to come in here, it would delay the process. If pt is aspirating then he needs to go to the ER. Message was relayed to spouse.

## 2022-07-08 NOTE — ED Triage Notes (Signed)
Pt reports generalized weakness over the past few days. Pt has hx of GERD and wife is concerned that he may have aspiration pneumonia. Pt was hypotensive in triage at 73/52.

## 2022-07-08 NOTE — ED Notes (Signed)
Patient transported to CT 

## 2022-07-08 NOTE — Discharge Instructions (Addendum)
You were evaluated in the emergency department for weakness.  The results of your evaluation suggest that you might have a developing pneumonia.  You were treated with fluids and antibiotics in the emergency department.  You will be discharged with a 5-day course of antibiotics, to be taken at home.  Because I suspect your generalized weakness to be multifactorial (not caused only by pneumonia), I have also ordered a referral for home health physical therapy and Occupational Therapy.  Please follow-up with your doctor soon as possible after leaving the hospital.  Return to the emergency department for signs and symptoms of worsening illness including, but not limited to: Shortness of breath, chest pain, confusion, new or worsening weakness or sensory changes worse on one side of your body, confusion, dizziness, fainting, fever greater than 100.4 F.

## 2022-07-08 NOTE — ED Provider Notes (Signed)
[facility] DEPT Provider Note   CSN: [id number] Arrival date & time: 07/08/22  1120     History  Chief Complaint  Patient presents with   Weakness    Lee Barnes is a 71 y.o. male who presents with several days of weakness, reflux, difficulty clearing secretions from airway.  He was recently discharged from the hospital after treatment for aspiration pneumonia.  He finished a course of amoxicillin.  Has been having worsening reflux for the last 3 weeks.  Last night had especially difficult time with it, woke up with a sour taste in his mouth.  Also has been feeling generally weak since discharge from the hospital a couple of weeks ago.  Reports frustration because general advice has been to be more active, but he feels limited by weakness.  Wife is concerned that he may be developing another episode of aspiration pneumonia based on weakness and increased reflux at night.  He had an appointment with his gastroenterologist today.  Before he went the patient called to say he was feeling poorly.  GI office told them to proceed to the ER.  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  Sequelae of cancer treatment include dysphagia, thus patient is dependent on G-tube feeding.  Recently started omeprazole via G-tube for GERD.   Weakness      Home Medications Prior to Admission medications   Medication Sig Start Date End Date Taking? Authorizing Provider  amoxicillin-clavulanate (AUGMENTIN) 875-125 MG tablet Take 1 tablet by mouth 2 (two) times daily for 5 days. 07/08/22 07/13/22 Yes Nani Gasser, MD  azithromycin (ZITHROMAX) 250 MG tablet Take 1 tablet (250 mg total) by mouth daily for 5 days. Take 1 tablet daily for 3 days. 07/08/22 07/13/22 Yes Nani Gasser, MD  cetirizine (ZYRTEC) 10 MG chewable tablet Place '10mg'$  into feeding tube daily as needed for allergies    [provider]  fluticasone  (FLONASE) 50 MCG/ACT nasal spray SPRAY TWO SPRAYS IN EACH NOSTRIL ONCE DAILY AS NEEDED Patient taking differently: Place 2 sprays into both nostrils at bedtime. 06/05/22   Denita Lung, MD  hydrocortisone cream 1 % Apply 1 application topically 2 (two) times daily as needed for itching.    [provider]  loperamide (IMODIUM) 2 MG capsule Place 39minto feeding tube daily as needed for diarrhea. Usually given during a feeding.    [provider]  midodrine (PROAMATINE) 2.5 MG tablet Place 2.'5mg'$  into feeding tube three times a day as needed for hypotension 06/18/22 06/13/23  [provider]  Nutritional Supplements (ISOSOURCE 1.5 CAL) LIQD Place 375 mLs into feeding tube 4 (four) times daily. Flush with 50 mL free water before and after tube feeds.  Follow with PCP and nephrology to consider adjustment of free water as needed based on sodium. 12/03/21   PElodia Florence, MD  nystatin (MYCOSTATIN) 100000 UNIT/ML suspension Use as directed 5 mLs (500,000 Units total) in the mouth or throat 4 (four) times daily. 06/27/22   CDessa Phi DO  omeprazole (PRILOSEC) 40 MG capsule Removed the granules from the capsule and place him in the G-tube daily. 06/28/22   LDenita Lung MD      Allergies    Codeine    Review of Systems   Review of Systems  Neurological:  Positive for weakness.  All other systems reviewed and are negative.   Physical Exam Updated Vital Signs BP (!) 160/93   Pulse  92   Temp 98.5 F (36.9 C) (Oral)   Resp 19   SpO2 97%  Physical Exam Vitals and nursing note reviewed.  Constitutional:      General: He is not in acute distress.    Appearance: He is well-developed.  HENT:     Head: Normocephalic and atraumatic.  Eyes:     Conjunctiva/sclera: Conjunctivae normal.  Cardiovascular:     Rate and Rhythm: Normal rate and regular rhythm.     Heart sounds: No murmur heard. Pulmonary:     Effort: Pulmonary effort is normal. No respiratory  distress.     Breath sounds: Normal breath sounds.  Abdominal:     Palpations: Abdomen is soft.     Tenderness: There is no abdominal tenderness.     Comments: G-tube in place.  Musculoskeletal:        General: No swelling.     Cervical back: Neck supple.  Skin:    General: Skin is warm and dry.     Capillary Refill: Capillary refill takes less than 2 seconds.  Neurological:     Mental Status: He is alert and oriented to person, place, and time.     Comments: R sided facial droop.  Psychiatric:        Mood and Affect: Mood normal.     ED Results / Procedures / Treatments   Labs (all labs ordered are listed, but only abnormal results are displayed) Labs Reviewed  CBC WITH DIFFERENTIAL/PLATELET - Abnormal; Notable for the following components:      Result Value   WBC 15.1 (*)    Neutro Abs 13.8 (*)    Lymphs Abs 0.3 (*)    All other components within normal limits  BASIC METABOLIC PANEL - Abnormal; Notable for the following components:   Sodium 132 (*)    Potassium 5.4 (*)    Chloride 96 (*)    Glucose, Bld 125 (*)    BUN 31 (*)    All other components within normal limits  URINALYSIS, ROUTINE W REFLEX MICROSCOPIC - Abnormal; Notable for the following components:   Protein, ur 30 (*)    Bacteria, UA RARE (*)    All other components within normal limits    EKG None  Radiology DG Chest 2 View  Result Date: 07/08/2022 CLINICAL DATA:  Generalized weakness over the last few days. Clinical concern for aspiration pneumonia. EXAM: CHEST - 2 VIEW COMPARISON:  Radiographs 06/25/2022 and 11/29/2021. FINDINGS: Stable chronic elevation of the right hemidiaphragm with associated right basilar atelectasis. There is slightly increased opacity projecting over the right hilum which could reflect superimposed inflammation. There is interval improved aeration of the left lung base which is now clear. The heart size and mediastinal contours are stable. There is no significant pleural  effusion or pneumothorax. No acute osseous findings are evident. A small linear foreign body projecting over the right upper quadrant is unchanged. IMPRESSION: 1. Interval increased patchy airspace disease at the right lung base which could reflect increased atelectasis or aspiration. 2. Interval improved aeration of the left lung base which is now clear. Chronic elevation of the right hemidiaphragm. Electronically Signed   By: Richardean Sale M.D.   On: 07/08/2022 13:34    Procedures Procedures  continuous cardiac monitoring shows normal sinus rhythm  Medications Ordered in ED Medications  sodium chloride 0.9 % bolus 500 mL (0 mLs Intravenous Stopped 07/08/22 1646)  amoxicillin-clavulanate (AUGMENTIN) 875-125 MG per tablet 1 tablet (1 tablet Oral Given 07/08/22 1636)  azithromycin (ZITHROMAX) tablet 500 mg (500 mg Oral Given 07/08/22 1636)    ED Course/ Medical Decision Making/ A&P Clinical Course as of 07/08/22 1700  Mon Jul 08, 2022  1406 WBC(!): 15.1 [MM]  1407 DG Chest 2 View Patchy infiltrate right lung base, new since 06/25/22. [MM]    Clinical Course User Index [MM] Nani Gasser, MD                           Medical Decision Making Amount and/or Complexity of Data Reviewed Labs: ordered. Decision-making details documented in ED Course. Radiology: ordered. Decision-making details documented in ED Course.  Risk Prescription drug management.   This patient is a 71 year old male with history of oropharyngeal cancer status post radiation and chemotherapy with residual dysphagia, G-tube dependent who presents with several days of worsening generalized weakness, severe gastroesophageal reflux.  Patient is concern for aspiration pneumonia as he has had this in the recent past.  Denies new or worsening cough, shortness of breath, fevers.  On exam patient is afebrile with clear lung sounds.  Laboratory work-up does show leukocytosis.  Chest x-ray shows interval development of patchy  right airspace infiltrate.  Suspect these findings may be due to developing pneumonia versus pneumonitis.  Patient is clinically well at this time.  Do not suspect cardiac etiology for his weakness.  More likely deconditioning in the setting of chronic illness and recent hospitalization.  Possible an underlying infection may be playing some role here but patient has been weak since recent discharge from hospital.   Co morbidities that complicate the patient evaluation  History of oropharyngeal cancer s/p chemo and radiation with sequelae including dysphagia and R sided facial droop Chronic hyponatremia G-tube dependent   Social Determinants of Health:  Lives at home with wife.   Additional history obtained:  Additional history and/or information obtained from wife at bedside and chart review External records from outside source obtained and reviewed including charts from prior hospital encounters.   Lab Tests:  I Ordered (or co-signed), and personally interpreted labs.  The pertinent results include:   CBC with leukocytosis to 15.1 BMP with Na to 132   Imaging Studies ordered:  I ordered (or co-signed) imaging studies including chest x-ray  I independently visualized and interpreted imaging which showed interval development of patchy infiltrate R lung base. I agree with the radiologist interpretation   Cardiac Monitoring:  The patient was maintained on a cardiac monitor.  The cardiac monitored showed an rhythm of normal sinus rhythm. The patient was also maintained on pulse oximetry. The readings were typically within normal limits.   Medicines ordered and prescription drug management:  I ordered medication including NS 500 mL IV Reevaluation of the patient after these medicines showed that the patient stayed the same  Reevaluation:  After the interventions noted above, I reevaluated the patient and found that they have :stayed the same.   Dispostion:  After  consideration of the diagnostic results and the patients response to treatment, I feel that the patent would benefit from discharge with 5-day course of Augmentin and azithromycin for aspiration pneumonia and referral for home health.          Final Clinical Impression(s) / ED Diagnoses Final diagnoses:  Generalized weakness  Aspiration pneumonia of right lung, unspecified aspiration pneumonia type, unspecified part of lung ([place])    Rx / DC Orders ED Discharge Orders          Ordered  Home Health        07/08/22 1554    Face-to-face encounter (required for Medicare/Medicaid patients)       Comments: I Nani Gasser certify that this patient is under my care and that I, or a nurse practitioner or physician's assistant working with me, had a face-to-face encounter that meets the physician face-to-face encounter requirements with this patient on 07/08/2022. The encounter with the patient was in whole, or in part for the following medical condition(s) which is the primary reason for home health care (List medical condition): generalized weakness due to deconditioning.   07/08/22 1554    amoxicillin-clavulanate (AUGMENTIN) 875-125 MG tablet  2 times daily        07/08/22 1550    azithromycin (ZITHROMAX) 250 MG tablet  Daily        07/08/22 1550              Nani Gasser, MD 07/08/22 1700    Carmin Muskrat, MD 07/15/22 1649

## 2022-07-09 ENCOUNTER — Ambulatory Visit (INDEPENDENT_AMBULATORY_CARE_PROVIDER_SITE_OTHER): Payer: Medicare Other | Admitting: Family Medicine

## 2022-07-09 ENCOUNTER — Encounter: Payer: Self-pay | Admitting: Family Medicine

## 2022-07-09 ENCOUNTER — Telehealth: Payer: Self-pay

## 2022-07-09 VITALS — BP 166/100 | HR 84 | Temp 97.3°F | Wt 168.0 lb

## 2022-07-09 DIAGNOSIS — Z85819 Personal history of malignant neoplasm of unspecified site of lip, oral cavity, and pharynx: Secondary | ICD-10-CM | POA: Diagnosis not present

## 2022-07-09 DIAGNOSIS — Z8701 Personal history of pneumonia (recurrent): Secondary | ICD-10-CM

## 2022-07-09 DIAGNOSIS — E871 Hypo-osmolality and hyponatremia: Secondary | ICD-10-CM | POA: Diagnosis not present

## 2022-07-09 NOTE — Progress Notes (Signed)
Subjective:    Patient ID: Lee Barnes, male    DOB: 1951-09-06, 71 y.o.   MRN: [id number]  HPI He is here for a recheck.  He was seen in the emergency room yesterday due to fatigue.  He has an extensive history of hyponatremia, aspiration pneumonia, all secondary to oropharyngeal cancer.  He has a feeding tube in place and recently has had increased difficulty with reflux over the last month.  He is now using Prilosec.  He was sent home yesterday after an evaluation and given Augmentin as well as azithromycin for the next 5 days.  They still have concerns over reflux and been seen recently by Dr. Elnoria Howard.  Lee Barnes is having difficulty with clearing his throat due to inability to swallow anything for fear of causing further aspiration.   Review of Systems     Objective:   Physical Exam Alert and in no distress.  Cardiac exam shows regular rhythm without murmurs or gallops.  Lungs are clear to auscultation.        Assessment & Plan:  Chronic hyponatremia  History of oropharyngeal cancer - Plan: AMB Referral to [facility]  History of aspiration pneumonia He will continue on the antibiotic.  Discussed deconditioning is playing a role in his reflux disease.  Encouraged him to become as active as he can be and I will have home health come by to see if they can help with physical therapy to get him more active.  The wife will also call speech pathology to see if there is anything they can do to help with helping him clear his throat.

## 2022-07-09 NOTE — Telephone Encounter (Signed)
Transition Care Management Follow-up Telephone Call Date of discharge and from where: Lee Barnes 07/08/22 How have you been since you were released from the hospital? Lee Barnes Any questions or concerns? No  Items Reviewed: Did the pt receive and understand the discharge instructions provided? Yes  Medications obtained and verified? Yes  Other? No  Any new allergies since your discharge? No  Dietary orders reviewed? Yes Do you have support at home? Yes   Home Care and Equipment/Supplies: Were home health services ordered? no  Functional Questionnaire: (I = Independent and D = Dependent) ADLs: I  Bathing/Dressing- I  Meal Prep- D  Eating- D  Maintaining continence- I  Transferring/Ambulation- D  Managing Meds- D  Follow up appointments reviewed:  PCP Hospital f/u appt confirmed? Yes  Scheduled to see Lee Barnes on 07/09/22 @ 11:45. [facility] f/u appt confirmed? No   Are transportation arrangements needed? No  If their condition worsens, is the pt aware to call PCP or go to the Emergency Dept.? Yes Was the patient provided with contact information for the PCP's office or ED? Yes Was to pt encouraged to call back with questions or concerns? Yes

## 2022-07-15 DIAGNOSIS — J690 Pneumonitis due to inhalation of food and vomit: Secondary | ICD-10-CM | POA: Diagnosis not present

## 2022-07-15 DIAGNOSIS — R633 Feeding difficulties, unspecified: Secondary | ICD-10-CM | POA: Diagnosis not present

## 2022-07-15 IMAGING — RF DG SWALLOWING FUNCTION
12 of 22 series · 12 of 24 positions shown · non-contrast
Comparison: 08/03/2020

CLINICAL DATA: Dysphagia. Base of tongue cancer.

EXAM:
MODIFIED BARIUM SWALLOW
TECHNIQUE: Different consistencies of barium were administered orally to the
patient by the Speech Pathologist. Imaging of the pharynx was
performed in the lateral projection. The radiologist was present in
the fluoroscopy room for this study, providing personal supervision.
FLUOROSCOPY TIME:  Fluoroscopy Time:  3 minute 54 second
Radiation Exposure Index (if provided by the fluoroscopic device):
Number of Acquired Spot Images: 22

[Series 2: run · 1 of 85 frames shown (1 of 12)]
[frame 13/85]
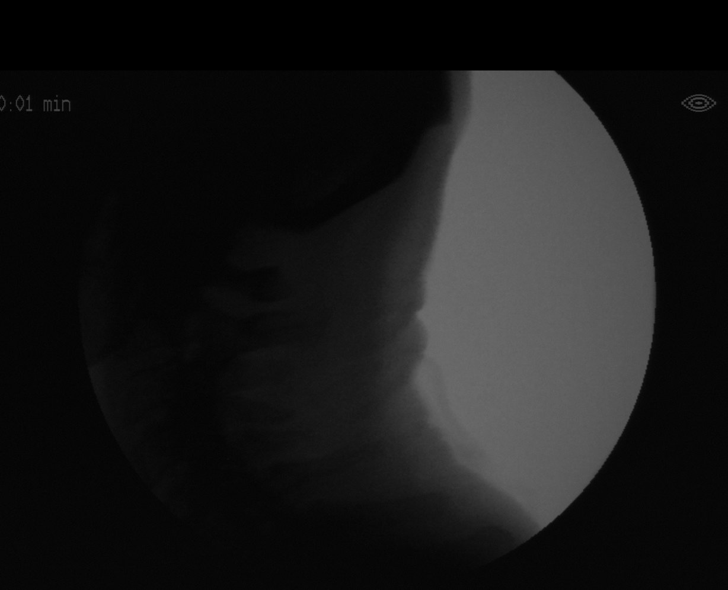

[Series 4: run · 1 of 188 frames shown (2 of 12)]
[frame 29/188]
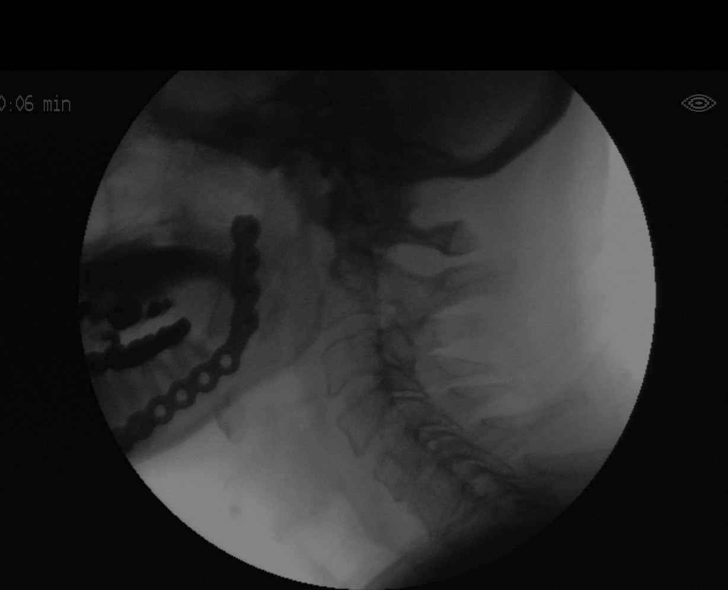

[Series 6: run · 1 of 327 frames shown (3 of 12)]
[frame 50/327]
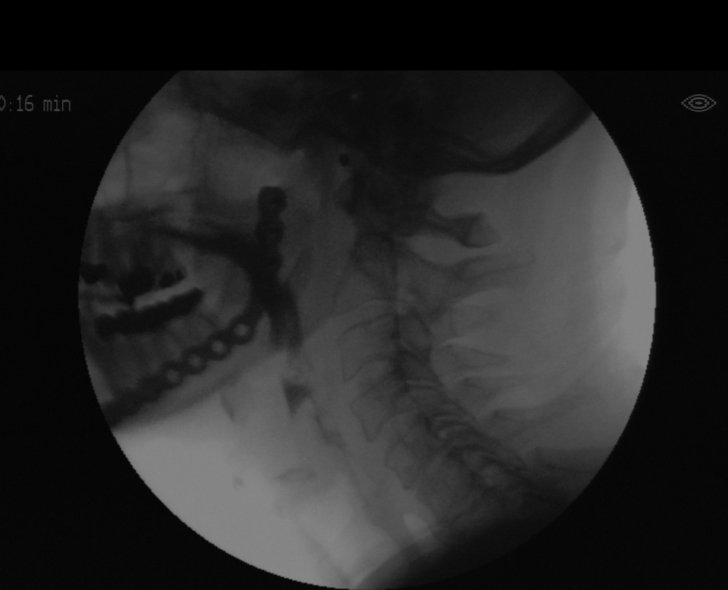

[Series 7: run · 1 of 249 frames shown (4 of 12)]
[frame 212/249]
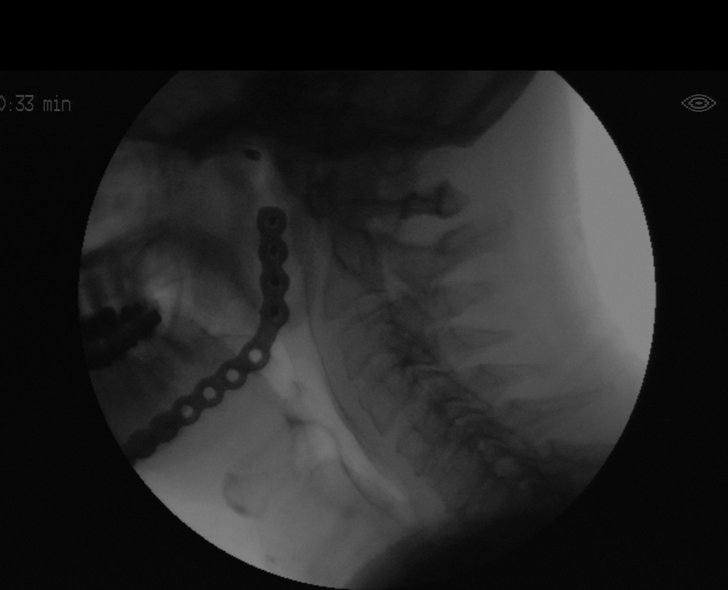

[Series 9: run · 1 of 303 frames shown (5 of 12)]
[frame 258/303]
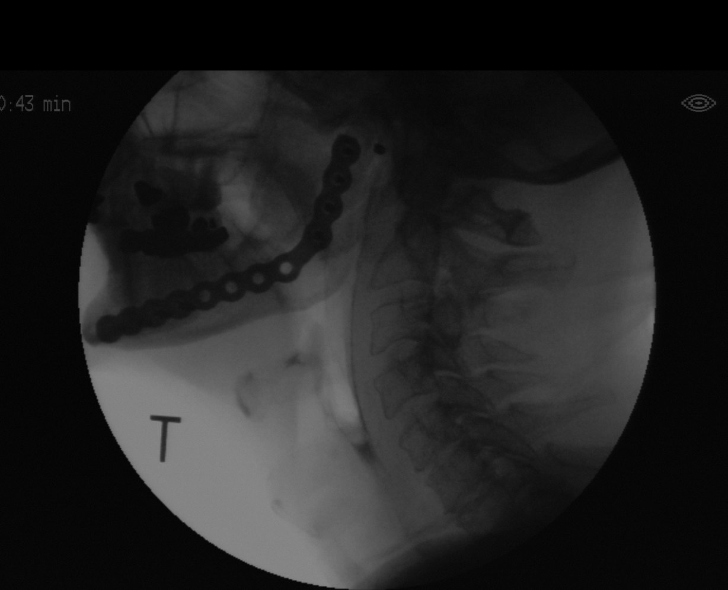

[Series 11: run · 1 of 431 frames shown (6 of 12)]
[frame 367/431]
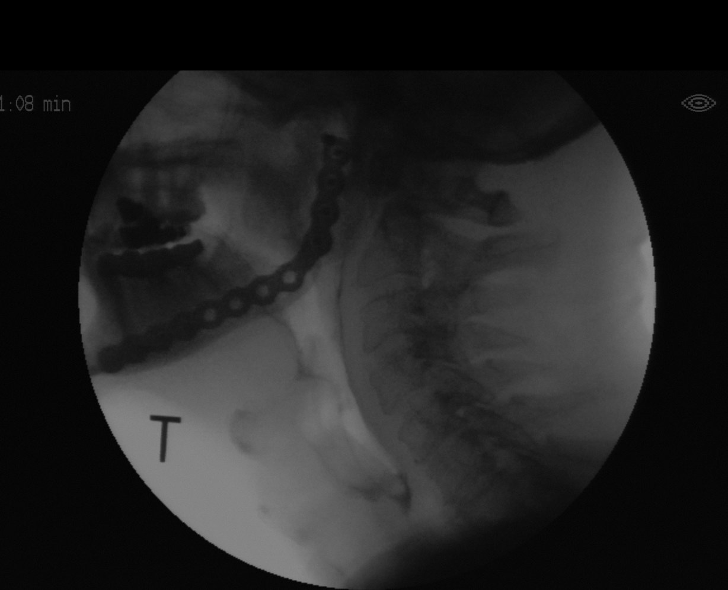

[Series 14: run · 1 of 665 frames shown (7 of 12)]
[frame 100/665]
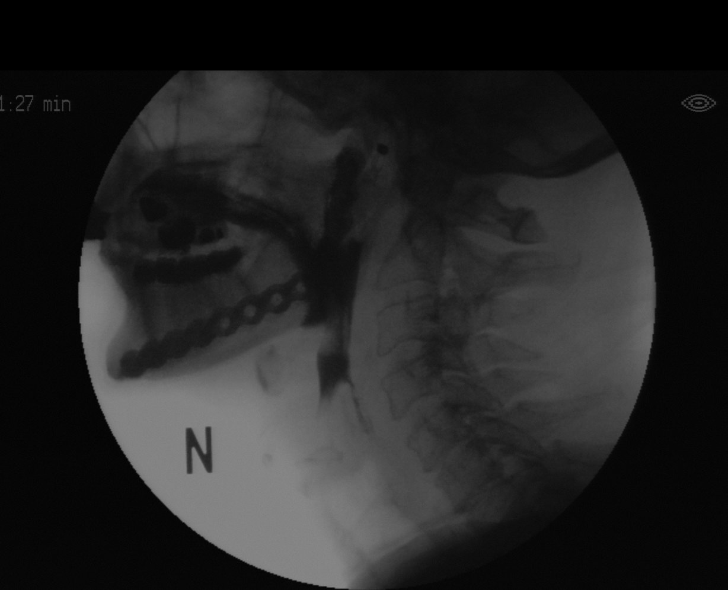

[Series 16: run · 1 of 584 frames shown (8 of 12)]
[frame 6/584]
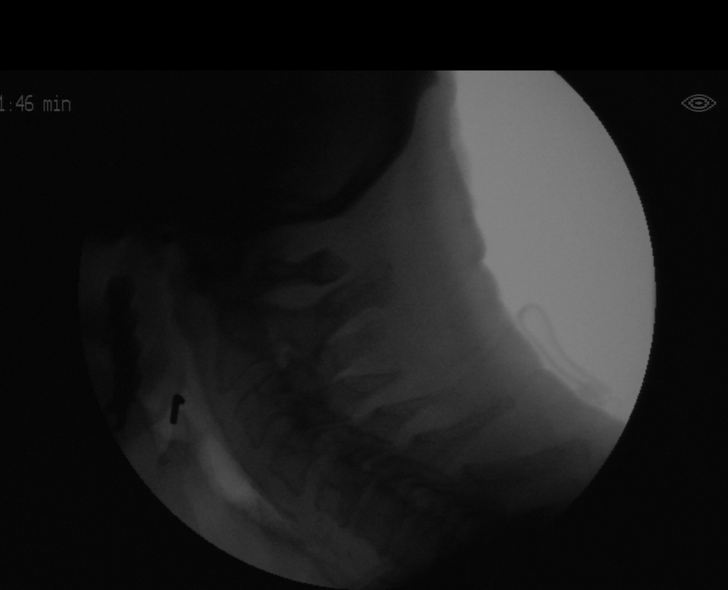

[Series 18: run · 1 of 515 frames shown (9 of 12)]
[frame 2/515]
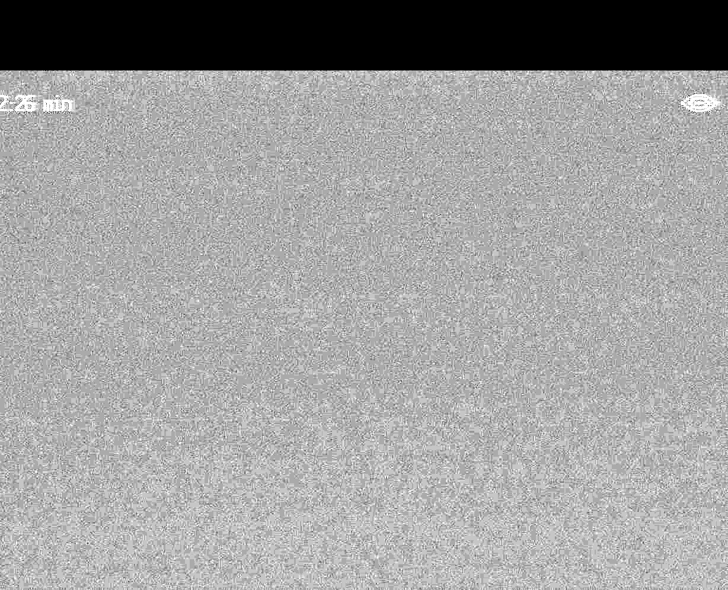

[Series 19: run · 1 of 79 frames shown (10 of 12)]
[frame 77/79]
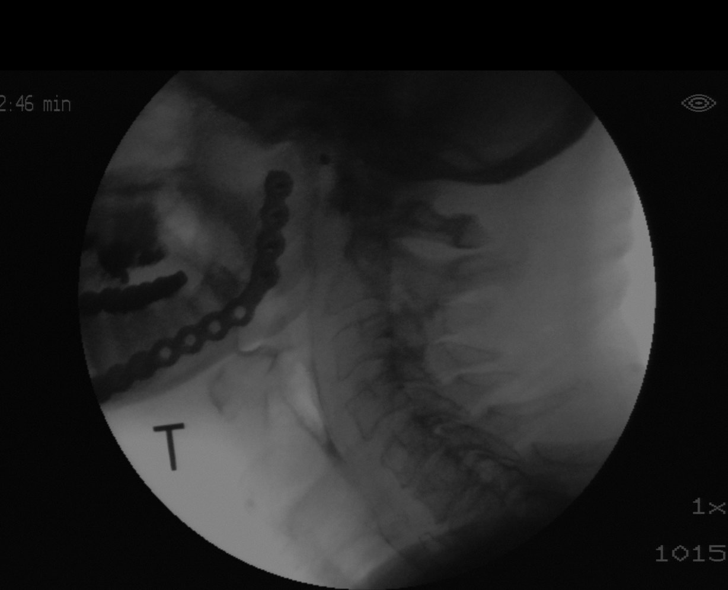

[Series 21: run · 1 of 223 frames shown (11 of 12)]
[frame 190/223]
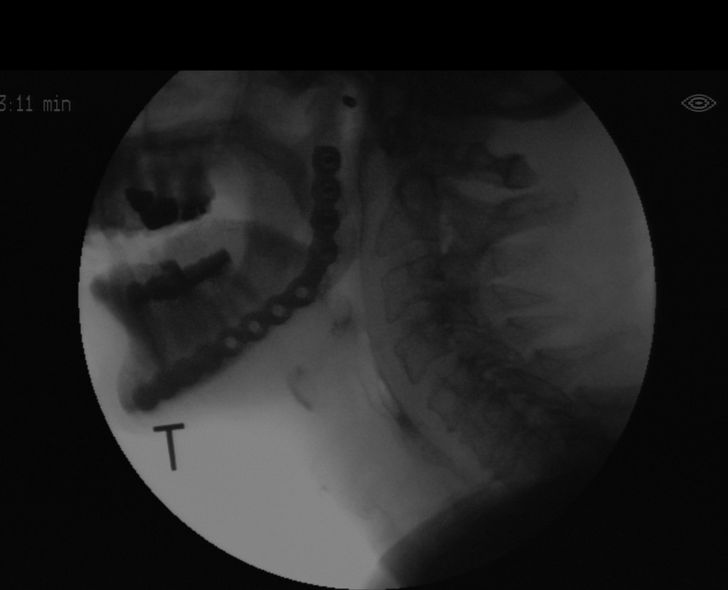

[Series 23: run · 1 of 500 frames shown (12 of 12)]
[frame 426/500]
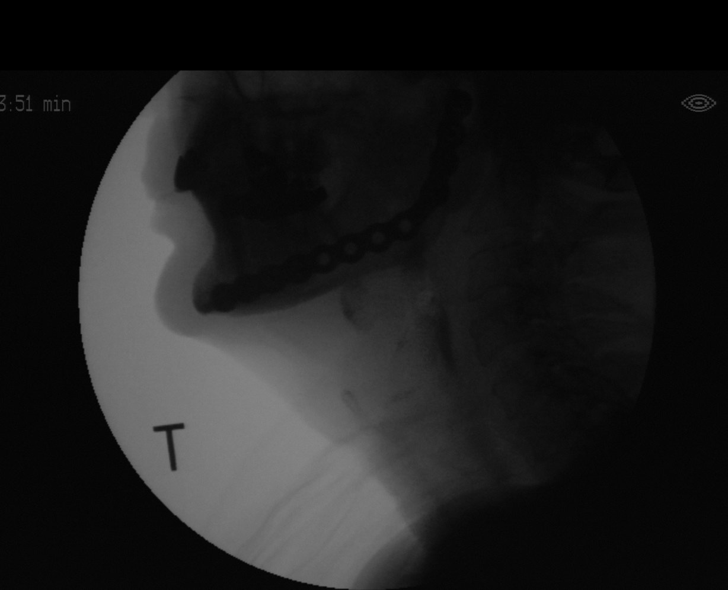

[12 of 24 positions shown; findings below may reference images not displayed]

FINDINGS: Clodomiro Providel PA was present for the study. Refer to speech
pathology report for findings.
IMPRESSION: Swallowing function study performed. Refer to speech pathology
report for findings.

Please refer to the Speech Pathologists report for complete details
and recommendations.

## 2022-07-16 ENCOUNTER — Ambulatory Visit: Payer: Medicare Other | Admitting: Family Medicine

## 2022-07-17 DIAGNOSIS — C44319 Basal cell carcinoma of skin of other parts of face: Secondary | ICD-10-CM

## 2022-07-17 HISTORY — DX: Basal cell carcinoma of skin of other parts of face: C44.319

## 2022-07-24 ENCOUNTER — Encounter: Payer: Self-pay | Admitting: Internal Medicine

## 2022-07-25 ENCOUNTER — Encounter: Payer: Self-pay | Admitting: Family Medicine

## 2022-07-29 DIAGNOSIS — E875 Hyperkalemia: Secondary | ICD-10-CM | POA: Diagnosis not present

## 2022-07-29 DIAGNOSIS — N182 Chronic kidney disease, stage 2 (mild): Secondary | ICD-10-CM | POA: Diagnosis not present

## 2022-07-29 DIAGNOSIS — R0989 Other specified symptoms and signs involving the circulatory and respiratory systems: Secondary | ICD-10-CM | POA: Diagnosis not present

## 2022-07-29 DIAGNOSIS — E871 Hypo-osmolality and hyponatremia: Secondary | ICD-10-CM | POA: Diagnosis not present

## 2022-08-21 DIAGNOSIS — R633 Feeding difficulties, unspecified: Secondary | ICD-10-CM | POA: Diagnosis not present

## 2022-08-27 ENCOUNTER — Encounter: Payer: Self-pay | Admitting: Internal Medicine

## 2022-09-09 ENCOUNTER — Encounter: Payer: Self-pay | Admitting: Internal Medicine

## 2022-09-25 DIAGNOSIS — Z961 Presence of intraocular lens: Secondary | ICD-10-CM | POA: Diagnosis not present

## 2022-10-02 DIAGNOSIS — R633 Feeding difficulties, unspecified: Secondary | ICD-10-CM | POA: Diagnosis not present

## 2022-10-06 ENCOUNTER — Other Ambulatory Visit: Payer: Self-pay

## 2022-10-06 ENCOUNTER — Emergency Department (HOSPITAL_COMMUNITY): Payer: Medicare Other

## 2022-10-06 ENCOUNTER — Encounter (HOSPITAL_COMMUNITY): Payer: Self-pay | Admitting: Internal Medicine

## 2022-10-06 ENCOUNTER — Inpatient Hospital Stay (HOSPITAL_COMMUNITY)
Admission: EM | Admit: 2022-10-06 | Discharge: 2022-10-15 | DRG: 871 | Disposition: A | Discharge status: Home Health | Payer: Medicare Other | Attending: Family Medicine | Admitting: Family Medicine

## 2022-10-06 DIAGNOSIS — Z931 Gastrostomy status: Secondary | ICD-10-CM | POA: Diagnosis not present

## 2022-10-06 DIAGNOSIS — I10 Essential (primary) hypertension: Secondary | ICD-10-CM | POA: Diagnosis not present

## 2022-10-06 DIAGNOSIS — E871 Hypo-osmolality and hyponatremia: Secondary | ICD-10-CM | POA: Diagnosis present

## 2022-10-06 DIAGNOSIS — Z808 Family history of malignant neoplasm of other organs or systems: Secondary | ICD-10-CM

## 2022-10-06 DIAGNOSIS — Z1152 Encounter for screening for COVID-19: Secondary | ICD-10-CM | POA: Diagnosis not present

## 2022-10-06 DIAGNOSIS — R0689 Other abnormalities of breathing: Secondary | ICD-10-CM | POA: Diagnosis not present

## 2022-10-06 DIAGNOSIS — R1312 Dysphagia, oropharyngeal phase: Secondary | ICD-10-CM

## 2022-10-06 DIAGNOSIS — D649 Anemia, unspecified: Secondary | ICD-10-CM | POA: Diagnosis not present

## 2022-10-06 DIAGNOSIS — Z85828 Personal history of other malignant neoplasm of skin: Secondary | ICD-10-CM | POA: Diagnosis not present

## 2022-10-06 DIAGNOSIS — Z9221 Personal history of antineoplastic chemotherapy: Secondary | ICD-10-CM

## 2022-10-06 DIAGNOSIS — Z85818 Personal history of malignant neoplasm of other sites of lip, oral cavity, and pharynx: Secondary | ICD-10-CM | POA: Diagnosis not present

## 2022-10-06 DIAGNOSIS — J9601 Acute respiratory failure with hypoxia: Secondary | ICD-10-CM | POA: Diagnosis present

## 2022-10-06 DIAGNOSIS — E43 Unspecified severe protein-calorie malnutrition: Secondary | ICD-10-CM | POA: Insufficient documentation

## 2022-10-06 DIAGNOSIS — J690 Pneumonitis due to inhalation of food and vomit: Secondary | ICD-10-CM

## 2022-10-06 DIAGNOSIS — J189 Pneumonia, unspecified organism: Secondary | ICD-10-CM | POA: Diagnosis not present

## 2022-10-06 DIAGNOSIS — G903 Multi-system degeneration of the autonomic nervous system: Secondary | ICD-10-CM | POA: Diagnosis present

## 2022-10-06 DIAGNOSIS — Z885 Allergy status to narcotic agent status: Secondary | ICD-10-CM | POA: Diagnosis not present

## 2022-10-06 DIAGNOSIS — R112 Nausea with vomiting, unspecified: Secondary | ICD-10-CM | POA: Diagnosis not present

## 2022-10-06 DIAGNOSIS — R0902 Hypoxemia: Secondary | ICD-10-CM | POA: Diagnosis not present

## 2022-10-06 DIAGNOSIS — C109 Malignant neoplasm of oropharynx, unspecified: Secondary | ICD-10-CM | POA: Diagnosis not present

## 2022-10-06 DIAGNOSIS — Z8249 Family history of ischemic heart disease and other diseases of the circulatory system: Secondary | ICD-10-CM | POA: Diagnosis not present

## 2022-10-06 DIAGNOSIS — I160 Hypertensive urgency: Secondary | ICD-10-CM | POA: Diagnosis not present

## 2022-10-06 DIAGNOSIS — Z923 Personal history of irradiation: Secondary | ICD-10-CM | POA: Diagnosis not present

## 2022-10-06 DIAGNOSIS — K219 Gastro-esophageal reflux disease without esophagitis: Secondary | ICD-10-CM | POA: Diagnosis present

## 2022-10-06 DIAGNOSIS — Z6822 Body mass index (BMI) 22.0-22.9, adult: Secondary | ICD-10-CM

## 2022-10-06 DIAGNOSIS — I517 Cardiomegaly: Secondary | ICD-10-CM | POA: Diagnosis not present

## 2022-10-06 DIAGNOSIS — J984 Other disorders of lung: Secondary | ICD-10-CM | POA: Diagnosis not present

## 2022-10-06 DIAGNOSIS — R1310 Dysphagia, unspecified: Secondary | ICD-10-CM | POA: Diagnosis present

## 2022-10-06 DIAGNOSIS — Z8701 Personal history of pneumonia (recurrent): Secondary | ICD-10-CM

## 2022-10-06 DIAGNOSIS — R000 Tachycardia, unspecified: Secondary | ICD-10-CM | POA: Diagnosis not present

## 2022-10-06 DIAGNOSIS — R918 Other nonspecific abnormal finding of lung field: Secondary | ICD-10-CM | POA: Diagnosis not present

## 2022-10-06 DIAGNOSIS — Z85819 Personal history of malignant neoplasm of unspecified site of lip, oral cavity, and pharynx: Secondary | ICD-10-CM | POA: Diagnosis not present

## 2022-10-06 DIAGNOSIS — R197 Diarrhea, unspecified: Secondary | ICD-10-CM | POA: Diagnosis not present

## 2022-10-06 DIAGNOSIS — J986 Disorders of diaphragm: Secondary | ICD-10-CM | POA: Diagnosis not present

## 2022-10-06 DIAGNOSIS — Z79899 Other long term (current) drug therapy: Secondary | ICD-10-CM

## 2022-10-06 DIAGNOSIS — A419 Sepsis, unspecified organism: Secondary | ICD-10-CM | POA: Diagnosis not present

## 2022-10-06 DIAGNOSIS — Z743 Need for continuous supervision: Secondary | ICD-10-CM | POA: Diagnosis not present

## 2022-10-06 DIAGNOSIS — Z789 Other specified health status: Secondary | ICD-10-CM

## 2022-10-06 LAB — URINALYSIS, ROUTINE W REFLEX MICROSCOPIC
Bilirubin Urine: NEGATIVE
Glucose, UA: NEGATIVE mg/dL
Hgb urine dipstick: NEGATIVE
Ketones, ur: NEGATIVE mg/dL
Leukocytes,Ua: NEGATIVE
Nitrite: NEGATIVE
Protein, ur: NEGATIVE mg/dL
Specific Gravity, Urine: 1.011 (ref 1.005–1.030)
pH: 7 (ref 5.0–8.0)

## 2022-10-06 LAB — BLOOD GAS, ARTERIAL
Acid-Base Excess: 1.8 mmol/L (ref 0.0–2.0)
Bicarbonate: 27.2 mmol/L (ref 20.0–28.0)
O2 Saturation: 78.4 %
Patient temperature: 37
pCO2 arterial: 45 mmHg (ref 32–48)
pH, Arterial: 7.39 (ref 7.35–7.45)
pO2, Arterial: 46 mmHg — ABNORMAL LOW (ref 83–108)

## 2022-10-06 LAB — CBC WITH DIFFERENTIAL/PLATELET
Abs Immature Granulocytes: 0.01 10*3/uL (ref 0.00–0.07)
Basophils Absolute: 0 10*3/uL (ref 0.0–0.1)
Basophils Relative: 0 %
Eosinophils Absolute: 0 10*3/uL (ref 0.0–0.5)
Eosinophils Relative: 0 %
HCT: 43.8 % (ref 39.0–52.0)
Hemoglobin: 14.6 g/dL (ref 13.0–17.0)
Immature Granulocytes: 0 %
Lymphocytes Relative: 3 %
Lymphs Abs: 0.1 10*3/uL — ABNORMAL LOW (ref 0.7–4.0)
MCH: 29.5 pg (ref 26.0–34.0)
MCHC: 33.3 g/dL (ref 30.0–36.0)
MCV: 88.5 fL (ref 80.0–100.0)
Monocytes Absolute: 0.1 10*3/uL (ref 0.1–1.0)
Monocytes Relative: 3 %
Neutro Abs: 3.8 10*3/uL (ref 1.7–7.7)
Neutrophils Relative %: 94 %
Platelets: 175 10*3/uL (ref 150–400)
RBC: 4.95 MIL/uL (ref 4.22–5.81)
RDW: 14.1 % (ref 11.5–15.5)
WBC: 4 10*3/uL (ref 4.0–10.5)
nRBC: 0 % (ref 0.0–0.2)

## 2022-10-06 LAB — COMPREHENSIVE METABOLIC PANEL
ALT: 26 U/L (ref 0–44)
AST: 31 U/L (ref 15–41)
Albumin: 4.2 g/dL (ref 3.5–5.0)
Alkaline Phosphatase: 102 U/L (ref 38–126)
Anion gap: 8 (ref 5–15)
BUN: 33 mg/dL — ABNORMAL HIGH (ref 8–23)
CO2: 28 mmol/L (ref 22–32)
Calcium: 9.7 mg/dL (ref 8.9–10.3)
Chloride: 98 mmol/L (ref 98–111)
Creatinine, Ser: 0.97 mg/dL (ref 0.61–1.24)
GFR, Estimated: >60 mL/min (ref >60)
Glucose, Bld: 97 mg/dL (ref 70–99)
Potassium: 4.8 mmol/L (ref 3.5–5.1)
Sodium: 134 mmol/L — ABNORMAL LOW (ref 135–145)
Total Bilirubin: 0.7 mg/dL (ref 0.3–1.2)
Total Protein: 8 g/dL (ref 6.5–8.1)

## 2022-10-06 LAB — RESP PANEL BY RT-PCR (RSV, FLU A&B, COVID)  RVPGX2
Influenza A by PCR: NEGATIVE
Influenza B by PCR: NEGATIVE
Resp Syncytial Virus by PCR: NEGATIVE
SARS Coronavirus 2 by RT PCR: NEGATIVE

## 2022-10-06 LAB — PROTIME-INR
INR: 1 (ref 0.8–1.2)
Prothrombin Time: 13.4 seconds (ref 11.4–15.2)

## 2022-10-06 LAB — LACTIC ACID, PLASMA
Lactic Acid, Venous: 2.7 mmol/L (ref 0.5–1.9)
Lactic Acid, Venous: 2.2 mmol/L (ref 0.5–1.9)

## 2022-10-06 LAB — BRAIN NATRIURETIC PEPTIDE: B Natriuretic Peptide: 28.2 pg/mL (ref 0.0–100.0)

## 2022-10-06 LAB — APTT: aPTT: 24 seconds (ref 24–36)

## 2022-10-06 MED ORDER — SODIUM CHLORIDE 0.9 % IV SOLN
3.0000 g | Freq: Four times a day (QID) | INTRAVENOUS | Status: AC
  Administered 2022-10-06 – 2022-10-11 (×18): 3 g via INTRAVENOUS
  Filled 2022-10-06 (×18): qty 8

## 2022-10-06 MED ORDER — JEVITY 1.5 CAL/FIBER PO LIQD
237.0000 mL | Freq: Three times a day (TID) | ORAL | Status: DC
  Administered 2022-10-07 – 2022-10-08 (×3): 237 mL
  Filled 2022-10-06 (×6): qty 237

## 2022-10-06 MED ORDER — ALBUTEROL SULFATE (2.5 MG/3ML) 0.083% IN NEBU
2.5000 mg | INHALATION_SOLUTION | RESPIRATORY_TRACT | Status: DC | PRN
  Administered 2022-10-07 – 2022-10-09 (×4): 2.5 mg via RESPIRATORY_TRACT
  Filled 2022-10-06 (×5): qty 3

## 2022-10-06 MED ORDER — ONDANSETRON HCL 4 MG PO TABS: 4.0000 mg | ORAL_TABLET | Freq: Four times a day (QID) | ORAL | Status: DC | PRN

## 2022-10-06 MED ORDER — LACTATED RINGERS IV SOLN: INTRAVENOUS | Status: DC

## 2022-10-06 MED ORDER — LACTATED RINGERS IV BOLUS (SEPSIS)
500.0000 mL | Freq: Once | INTRAVENOUS | Status: AC
  Administered 2022-10-06: 500 mL via INTRAVENOUS

## 2022-10-06 MED ORDER — ALBUTEROL SULFATE (2.5 MG/3ML) 0.083% IN NEBU
2.5000 mg | INHALATION_SOLUTION | Freq: Two times a day (BID) | RESPIRATORY_TRACT | Status: DC
  Administered 2022-10-06 – 2022-10-07 (×2): 2.5 mg via RESPIRATORY_TRACT
  Filled 2022-10-06 (×2): qty 3

## 2022-10-06 MED ORDER — SODIUM CHLORIDE 0.9 % IV SOLN: INTRAVENOUS | Status: AC

## 2022-10-06 MED ORDER — ACETAMINOPHEN 500 MG PO TABS
1000.0000 mg | ORAL_TABLET | Freq: Once | ORAL | Status: AC
  Administered 2022-10-06: 1000 mg via NASOGASTRIC
  Filled 2022-10-06: qty 2

## 2022-10-06 MED ORDER — SODIUM CHLORIDE 0.9 % IV BOLUS (SEPSIS)
1000.0000 mL | Freq: Once | INTRAVENOUS | Status: AC
  Administered 2022-10-06: 1000 mL via INTRAVENOUS

## 2022-10-06 MED ORDER — PIPERACILLIN-TAZOBACTAM 3.375 G IVPB 30 MIN
3.3750 g | Freq: Once | INTRAVENOUS | Status: AC
  Administered 2022-10-06: 3.375 g via INTRAVENOUS
  Filled 2022-10-06: qty 50

## 2022-10-06 MED ORDER — LOPERAMIDE HCL 1 MG/7.5ML PO SUSP
2.0000 mg | Freq: Every day | ORAL | Status: DC | PRN
  Administered 2022-10-08 – 2022-10-11 (×4): 2 mg
  Filled 2022-10-06 (×7): qty 15

## 2022-10-06 MED ORDER — SODIUM CHLORIDE 0.9 % IV SOLN
1.0000 g | Freq: Once | INTRAVENOUS | Status: AC
  Administered 2022-10-06: 1 g via INTRAVENOUS
  Filled 2022-10-06: qty 10

## 2022-10-06 MED ORDER — POLYVINYL ALCOHOL 1.4 % OP SOLN: 1.0000 [drp] | Freq: Every day | OPHTHALMIC | Status: DC | PRN

## 2022-10-06 MED ORDER — ACETAMINOPHEN 325 MG PO TABS: 650.0000 mg | ORAL_TABLET | Freq: Four times a day (QID) | ORAL | Status: DC | PRN

## 2022-10-06 MED ORDER — PANTOPRAZOLE SODIUM 40 MG PO TBEC: 40.0000 mg | DELAYED_RELEASE_TABLET | Freq: Every day | ORAL | Status: DC

## 2022-10-06 MED ORDER — PEDIALYTE PO SOLN
355.0000 mL | Freq: Every day | ORAL | Status: DC
  Administered 2022-10-07 – 2022-10-08 (×2): 355 mL
  Administered 2022-10-09: 175 mL
  Administered 2022-10-11: 355 mL
  Filled 2022-10-06 (×2): qty 1000

## 2022-10-06 MED ORDER — IPRATROPIUM-ALBUTEROL 0.5-2.5 (3) MG/3ML IN SOLN
3.0000 mL | Freq: Once | RESPIRATORY_TRACT | Status: AC
  Administered 2022-10-06: 3 mL via RESPIRATORY_TRACT
  Filled 2022-10-06: qty 3

## 2022-10-06 MED ORDER — ENOXAPARIN SODIUM 40 MG/0.4ML IJ SOSY
40.0000 mg | PREFILLED_SYRINGE | INTRAMUSCULAR | Status: DC
  Administered 2022-10-06 – 2022-10-14 (×9): 40 mg via SUBCUTANEOUS
  Filled 2022-10-06 (×9): qty 0.4

## 2022-10-06 MED ORDER — ALBUTEROL SULFATE (2.5 MG/3ML) 0.083% IN NEBU
2.5000 mg | INHALATION_SOLUTION | Freq: Four times a day (QID) | RESPIRATORY_TRACT | Status: DC
  Administered 2022-10-06: 2.5 mg via RESPIRATORY_TRACT
  Filled 2022-10-06 (×2): qty 3

## 2022-10-06 MED ORDER — ACETAMINOPHEN 650 MG RE SUPP: 650.0000 mg | Freq: Four times a day (QID) | RECTAL | Status: DC | PRN

## 2022-10-06 MED ORDER — ISOSOURCE 1.5 CAL PO LIQD
250.0000 mL | Freq: Three times a day (TID) | ORAL | Status: DC
  Administered 2022-10-07 – 2022-10-08 (×4): 250 mL

## 2022-10-06 MED ORDER — FLUTICASONE PROPIONATE 50 MCG/ACT NA SUSP
2.0000 | Freq: Every day | NASAL | Status: DC
  Filled 2022-10-06: qty 16

## 2022-10-06 MED ORDER — MIDODRINE HCL 5 MG PO TABS
2.5000 mg | ORAL_TABLET | Freq: Three times a day (TID) | ORAL | Status: DC
  Administered 2022-10-06 (×2): 2.5 mg
  Filled 2022-10-06: qty 1

## 2022-10-06 MED ORDER — ONDANSETRON HCL 4 MG/2ML IJ SOLN
4.0000 mg | Freq: Four times a day (QID) | INTRAMUSCULAR | Status: DC | PRN
  Administered 2022-10-07 – 2022-10-09 (×3): 4 mg via INTRAVENOUS
  Filled 2022-10-06 (×3): qty 2

## 2022-10-06 MED ORDER — MIDODRINE HCL 5 MG PO TABS
2.5000 mg | ORAL_TABLET | Freq: Three times a day (TID) | ORAL | Status: DC
  Filled 2022-10-06: qty 1

## 2022-10-06 MED ORDER — SODIUM CHLORIDE 0.9 % IV SOLN
500.0000 mg | Freq: Once | INTRAVENOUS | Status: AC
  Administered 2022-10-06: 500 mg via INTRAVENOUS
  Filled 2022-10-06: qty 5

## 2022-10-06 NOTE — ED Triage Notes (Signed)
Pt bib by [facility] from home. Pt hx of cancer and is in remission. Has feeding tube that dislodged other night. It was reinserted at home. Pt c/o increased GI upset increased phlegm,  pt sat's 85 on 10L simple mask  ems placed on nonrebreather. Pt meets sepsis criteria.    186/104 145 40 85% 10 simple mask

## 2022-10-06 NOTE — ED Notes (Signed)
Pt hypotensive 85/64 MD notified. Pt repositioned, bolus fluid infusing.

## 2022-10-06 NOTE — Sepsis Progress Note (Signed)
Elink following code sepsis °

## 2022-10-06 NOTE — Plan of Care (Signed)
  Problem: Clinical Measurements: Goal: Diagnostic test results will improve Outcome: Progressing   Problem: Activity: Goal: Risk for activity intolerance will decrease Outcome: Progressing   Problem: Safety: Goal: Ability to remain free from injury will improve Outcome: Progressing   Problem: Skin Integrity: Goal: Risk for impaired skin integrity will decrease Outcome: Progressing

## 2022-10-06 NOTE — ED Provider Triage Note (Signed)
Emergency Medicine Provider Triage Evaluation Note  Lee Barnes , a 71 y.o. male  was evaluated in triage.  Pt complains of sob since last night. Feels like he has increased phlegm that he needs to clear from his throat and he thinks he may have aspirated around 1am when he woke very short of breath.  Felt hot, no definite fever.  Had diarrhea  Review of Systems  Positive: sob Negative: Chest pain  Physical Exam  Pulse (!) 137   Temp 98.3 F (36.8 C) (Oral)   SpO2 94%  Gen:   Awake Resp:  Rhonchi bilaterally MSK:   Moves extremities without difficulty  Other:  tachycardic  Medical Decision Making  Medically screening exam initiated at 6:14 AM.  Appropriate orders placed.  Lee Barnes was informed that the remainder of the evaluation will be completed by another provider, this initial triage assessment does not replace that evaluation, and the importance of remaining in the ED until their evaluation is complete.     Quintella Reichert, MD 10/06/22 [phone]

## 2022-10-06 NOTE — H&P (Signed)
History and Physical    Patient: Lee Barnes NFA:[id number] DOB: 02-24-1951 DOA: 10/06/2022 DOS: the patient was seen and examined on 10/06/2022 PCP: Ronnald Nian, MD  Patient coming from: Home  Chief Complaint:  Chief Complaint  Patient presents with   Fatigue   HPI: Lee Barnes is a 71 y.o. 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  History of multiple episodes of aspiration pneumonia who was brought to the emergency due to hypoxia after his wife found him in the bathroom "gagging and short of breath".  The patient stated that he seen to have aspirated.  He has also had increased GI upset due to increased phlegm from PEG tube.  The patient stated that his feeding tube got dislodged and was reinserted at home.  He saw Dr. Jeani Hawking that same day to check for PEG positioning.  The tube has been working without issue since then.  No fevers at home, but had a fever after arriving to the emergency department.  He has hassling wheezing, but no hemoptysis. No chest pain, palpitations, diaphoresis, PND, orthopnea or pitting edema of the lower extremities.  No appetite changes, abdominal pain, diarrhea, constipation, melena or hematochezia.  No flank pain, dysuria, frequency or hematuria.  No polyuria, polydipsia, polyphagia or blurred vision.  ED course: Initial vital signs were temperature 98.3 F, but increased to 101.6 F, pulse 137, respiration 23, BP 128/78 mmHg.  The patient was subsequently hypotensive, but his pressure then normalized.  He received ceftriaxone, azithromycin and then Zosyn after aspiration diagnosis was more clear.  He also received a DuoNeb and 1500 mL of LR bolus.  Lab work: His urinalysis with a few bacteria but otherwise unremarkable.  CBC  showed a white count of 4.0 with 94% neutrophils, hemoglobin 14.6 g/dL platelets 469.  CMP showed a sodium 134 and BUN of 33.  Normal PT, INR and PTT.  Coronavirus, RSV and influenza PCR negative.  Lactic acid is 2.7 then 2.2 mmol/L.  An arterial blood gas showed decreased PO2 of 46 mmHg, but was otherwise normal.  Imaging: Portable 1 view chest radiograph show extensive infiltrate on the right.  Follow-up PA and lateral chest recommended.  There is chronic elevation of the right diaphragm.   Review of Systems: As mentioned in the history of present illness. All other systems reviewed and are negative. Past Medical History:  Diagnosis Date   Allergy to environmental factors    Basal cell carcinoma of left cheek 07/17/2022   Complication of anesthesia    "vageled" after surgery -overnite stay   Diverticulosis 2008   Heart murmur    hx of in childhood    History of chemotherapy    History of radiation therapy 11/05/10-12/26/10   r base tongue, 7000 cGy 35 sessions   Hypertension    Medication started in May 2016.   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   Tinnitus    Past Surgical History:  Procedure Laterality Date   APPENDECTOMY     COLONOSCOPY     INGUINAL HERNIA REPAIR Bilateral 03/30/2015   Procedure: LAPAROSCOPIC BILATERAL INGUINAL HERNIA REPAIR WITH MESH;  Surgeon: Abigail Miyamoto, MD;  Location: [facility]S;  Service: General;  Laterality: Bilateral;   INGUINAL  HERNIA REPAIR Right 11/18/1958   INGUINAL HERNIA REPAIR Bilateral 03/30/2015   INGUINAL HERNIA REPAIR Left 07/11/2015   INGUINAL HERNIA REPAIR N/A 07/11/2015   Procedure: REPAIR OF LEFT INGUINAL HERNIA WITH MESH;  Surgeon: Abigail Miyamoto, MD;  Location: [facility] OR;  Service: General;  Laterality: N/A;   INSERTION OF MESH N/A 07/11/2015   Procedure: INSERTION OF MESH;  Surgeon: Abigail Miyamoto, MD;  Location: [facility] OR;  Service: General;  Laterality: N/A;   IR  REPLC GASTRO/COLONIC TUBE PERCUT W/FLUORO  12/03/2021   LUMBAR DISC SURGERY     PEG PLACEMENT N/A 03/22/2022   Procedure: PERCUTANEOUS ENDOSCOPIC GASTROSTOMY (PEG) REPLACEMENT;  Surgeon: Jeani Hawking, MD;  Location: [facility];  Service: Gastroenterology;  Laterality: N/A;   SAVORY DILATION N/A 03/22/2022   Procedure: SAVORY DILATION;  Surgeon: Jeani Hawking, MD;  Location: [facility];  Service: Gastroenterology;  Laterality: N/A;   SKIN BIOPSY Left 06/24/2022   basal cell carcinoma face cheek   TONSILLECTOMY     Social History:  reports that he has never smoked. He has never used smokeless tobacco. He reports current alcohol use. He reports that he does not use drugs.  Allergies  Allergen Reactions   Codeine Nausea And Vomiting    Family History  Problem Relation Age of Onset   Heart disease Father    Cancer Father        throat ca   Heart disease Mother    Colon cancer Neg Hx     Prior to Admission medications   Medication Sig Start Date End Date Taking? Authorizing Provider  cetirizine (ZYRTEC) 10 MG chewable tablet Place 10mg  into feeding tube daily as needed for allergies    [provider]  fluticasone (FLONASE) 50 MCG/ACT nasal spray SPRAY TWO SPRAYS IN EACH NOSTRIL ONCE DAILY AS NEEDED Patient taking differently: Place 2 sprays into both nostrils at bedtime. 06/05/22   Ronnald Nian, MD  hydrocortisone cream 1 % Apply 1 application topically 2 (two) times daily as needed for itching. Patient not taking: Reported on 07/09/2022    [provider]  loperamide (IMODIUM) 2 MG capsule Place 4m into feeding tube daily as needed for diarrhea. Usually given during a feeding.    [provider]  midodrine (PROAMATINE) 2.5 MG tablet Place 2.5mg  into feeding tube three times a day as needed for hypotension Patient not taking: Reported on 07/09/2022 06/18/22 06/13/23  [provider]  Nutritional Supplements (ISOSOURCE 1.5 CAL) LIQD Place 375 mLs  into feeding tube 4 (four) times daily. Flush with 50 mL free water before and after tube feeds.  Follow with PCP and nephrology to consider adjustment of free water as needed based on sodium. 12/03/21   Zigmund Daniel., MD  nystatin (MYCOSTATIN) 100000 UNIT/ML suspension Use as directed 5 mLs (500,000 Units total) in the mouth or throat 4 (four) times daily. Patient not taking: Reported on 07/09/2022 06/27/22   Noralee Stain, DO  omeprazole (PRILOSEC) 40 MG capsule Removed the granules from the capsule and place him in the G-tube daily. 06/28/22   Ronnald Nian, MD    Physical Exam: Vitals:   10/06/22 0845 10/06/22 0900 10/06/22 0915 10/06/22 0930  BP:  (!) 78/56  114/86  Pulse: 88 86 82 94  Resp: 18 (!) 26 16 (!) 23  Temp:      TempSrc:      SpO2: 97% 97% 97% 95%  Weight:      Height:  Physical Exam Vitals and nursing note reviewed.  Constitutional:      General: He is awake. He is not in acute distress.    Appearance: He is normal weight. He is ill-appearing.  HENT:     Head: Normocephalic.     Nose: No rhinorrhea.     Mouth/Throat:     Mouth: Mucous membranes are dry.  Eyes:     General: No scleral icterus.    Pupils: Pupils are equal, round, and reactive to light.  Neck:     Vascular: No JVD.  Cardiovascular:     Rate and Rhythm: Normal rate and regular rhythm.     Heart sounds: S1 normal and S2 normal.  Pulmonary:     Effort: No tachypnea, accessory muscle usage or prolonged expiration.     Breath sounds: No stridor. Wheezing and rhonchi present.  Abdominal:     General: The ostomy site is clean. Bowel sounds are normal.     Palpations: Abdomen is soft.     Tenderness: There is no abdominal tenderness. There is no right CVA tenderness or left CVA tenderness.  Musculoskeletal:     Cervical back: Neck supple.     Right lower leg: No edema.     Left lower leg: No edema.  Skin:    General: Skin is warm and dry.  Neurological:     General: No focal  deficit present.     Mental Status: He is alert and oriented to person, place, and time.  Psychiatric:        Mood and Affect: Mood normal.        Behavior: Behavior is cooperative.   Data Reviewed:  Results are pending, will review when available.  Assessment and Plan: Principal problem:   Sepsis due to   Aspiration pneumonia POA (HCC) Observation/PCU. Lactic acid trending down. Continue time-limited IV fluids. Continue as needed oxygen. Continue bronchodilators as needed. Begin Unasyn 3 g every 6 hours. Check sputum Gram stain, culture and sensitivity. Follow-up blood culture and sensitivity. Follow-up hematology and chemistry in the morning.  Active Problems:   Neurogenic orthostatic hypotension (HCC) Continue midodrine.    Chronic hyponatremia Around baseline.    GERD (gastroesophageal reflux disease) Continue pantoprazole.    Advance Care Planning:   Code Status: Full Code   Consults:   Family Communication:   Severity of Illness: The appropriate patient status for this patient is INPATIENT. Inpatient status is judged to be reasonable and necessary in order to provide the required intensity of service to ensure the patient's safety. The patient's presenting symptoms, physical exam findings, and initial radiographic and laboratory data in the context of their chronic comorbidities is felt to place them at high risk for further clinical deterioration. Furthermore, it is not anticipated that the patient will be medically stable for discharge from the hospital within 2 midnights of admission.   * I certify that at the point of admission it is my clinical judgment that the patient will require inpatient hospital care spanning beyond 2 midnights from the point of admission due to high intensity of service, high risk for further deterioration and high frequency of surveillance required.*  Author: Bobette Mo, MD 10/06/2022 10:04 AM  For on call review  [email or web address].   This document was prepared using Dragon voice recognition software and may contain some unintended transcription errors.

## 2022-10-06 NOTE — Progress Notes (Signed)
A consult was received from an ED provider for Zosyn per pharmacy dosing.  The patient's profile has been reviewed for ht/wt/allergies/indication/available labs.    A one time order has been placed for Zosyn 3.375g IV x 1.  Further antibiotics/pharmacy consults should be ordered by admitting physician if indicated.                       Thank you, Luiz Ochoa 10/06/2022  8:16 AM

## 2022-10-06 NOTE — ED Provider Notes (Signed)
[place] DEPT Provider Note   CSN: [id number] Arrival date & time: 10/06/22  0535     History  Chief Complaint  Patient presents with   Fatigue    Lee Barnes is a 71 y.o. male with an extensive past medical history including oropharyngeal cancer of the tongue, history of chemotherapy, history of radiation therapy, PEG tube placement, history of aspiration pneumonia.  Patient was brought in by EMS after his wife found him in the bathroom gagging and short of breath.  Patient arrived to the ER on 10 L via nonrebreather after finding his oxygen saturations in the 80s.  Patient states that he feels like he has secretions in his throat that he could not clear which is common for him and that he was gagging because he could not get it out.  He denies any nausea.  Patient notably warm to the touch.  Patient also notably hypotensive.  HPI     Home Medications Prior to Admission medications   Medication Sig Start Date End Date Taking? Authorizing Provider  cetirizine (ZYRTEC) 10 MG chewable tablet Place '10mg'$  into feeding tube daily as needed for allergies    [provider]  fluticasone (FLONASE) 50 MCG/ACT nasal spray SPRAY TWO SPRAYS IN EACH NOSTRIL ONCE DAILY AS NEEDED Patient taking differently: Place 2 sprays into both nostrils at bedtime. 06/05/22   Denita Lung, MD  hydrocortisone cream 1 % Apply 1 application topically 2 (two) times daily as needed for itching. Patient not taking: Reported on 07/09/2022    [provider]  loperamide (IMODIUM) 2 MG capsule Place 61minto feeding tube daily as needed for diarrhea. Usually given during a feeding.    [provider]  midodrine (PROAMATINE) 2.5 MG tablet Place 2.'5mg'$  into feeding tube three times a day as needed for hypotension Patient not taking: Reported on 07/09/2022 06/18/22 06/13/23  [provider]  Nutritional Supplements (ISOSOURCE 1.5 CAL) LIQD Place 375 mLs  into feeding tube 4 (four) times daily. Flush with 50 mL free water before and after tube feeds.  Follow with PCP and nephrology to consider adjustment of free water as needed based on sodium. 12/03/21   PElodia Florence, MD  nystatin (MYCOSTATIN) 100000 UNIT/ML suspension Use as directed 5 mLs (500,000 Units total) in the mouth or throat 4 (four) times daily. Patient not taking: Reported on 07/09/2022 06/27/22   CDessa Phi DO  omeprazole (PRILOSEC) 40 MG capsule Removed the granules from the capsule and place him in the G-tube daily. 06/28/22   LDenita Lung MD      Allergies    Codeine    Review of Systems   Review of Systems  Physical Exam Updated Vital Signs BP (!) 85/51   Pulse (!) 109   Temp (!) 101.6 F (38.7 C) (Oral)   Resp (!) 23   SpO2 93%  Physical Exam Vitals and nursing note reviewed.  Constitutional:      General: He is not in acute distress.    Appearance: He is well-developed. He is ill-appearing. He is not diaphoretic.     Interventions: Face mask in place.  HENT:     Head: Normocephalic and atraumatic.     Mouth/Throat:     Mouth: Mucous membranes are dry.  Eyes:     General: No scleral icterus.    Conjunctiva/sclera: Conjunctivae normal.     Pupils: Pupils are equal, round, and reactive to light.  Cardiovascular:  Rate and Rhythm: Regular rhythm. Tachycardia present.     Heart sounds: Normal heart sounds.  Pulmonary:     Effort: Tachypnea, prolonged expiration and respiratory distress present.     Breath sounds: Wheezing and rhonchi present.  Abdominal:     General: There is no distension.     Palpations: Abdomen is soft.     Tenderness: There is no abdominal tenderness.  Musculoskeletal:     Cervical back: Normal range of motion and neck supple.  Skin:    General: Skin is warm and dry.  Neurological:     Mental Status: He is alert.  Psychiatric:        Behavior: Behavior normal.     ED Results / Procedures / Treatments    Labs (all labs ordered are listed, but only abnormal results are displayed) Labs Reviewed  LACTIC ACID, PLASMA - Abnormal; Notable for the following components:      Result Value   Lactic Acid, Venous 2.7 (*)    All other components within normal limits  COMPREHENSIVE METABOLIC PANEL - Abnormal; Notable for the following components:   Sodium 134 (*)    BUN 33 (*)    All other components within normal limits  CBC WITH DIFFERENTIAL/PLATELET - Abnormal; Notable for the following components:   Lymphs Abs 0.1 (*)    All other components within normal limits  CULTURE, BLOOD (ROUTINE X 2)  CULTURE, BLOOD (ROUTINE X 2)  URINE CULTURE  RESP PANEL BY RT-PCR (RSV, FLU A&B, COVID)  RVPGX2  PROTIME-INR  APTT  LACTIC ACID, PLASMA  URINALYSIS, ROUTINE W REFLEX MICROSCOPIC  BRAIN NATRIURETIC PEPTIDE  BLOOD GAS, ARTERIAL  I-STAT CHEM 8, ED    EKG EKG Interpretation  Date/Time:  Sunday October 06 2022 05:50:18 EST Ventricular Rate:  132 PR Interval:  155 QRS Duration: 80 QT Interval:  288 QTC Calculation: 427 R Axis:   254 Text Interpretation: Sinus tachycardia Non-specific ST-t changes Confirmed by Lajean Saver [phone]) on 10/06/2022 7:00:49 AM  Radiology DG Chest Port 1 View  Result Date: 10/06/2022 CLINICAL DATA:  Questionable sepsis EXAM: PORTABLE CHEST 1 VIEW COMPARISON:  07/08/2022 FINDINGS: Extensive airspace disease on the right. Chronic elevation of the right diaphragm. Normal heart size and mediastinal contours. Artifact from EKG leads. IMPRESSION: Extensive infiltrate on the right. Followup PA and lateral chest X-ray is recommended in 3-4 weeks following trial of antibiotic therapy to ensure resolution. Chronic elevation of the right diaphragm. Electronically Signed   By: Jorje Guild M.D.   On: 10/06/2022 07:04    Procedures .Critical Care  Performed by: Margarita Mail, PA-C Authorized by: Margarita Mail, PA-C   Critical care provider statement:    Critical care  time (minutes):  60   Critical care time was exclusive of:  Separately billable procedures and treating other patients   Critical care was necessary to treat or prevent imminent or life-threatening deterioration of the following conditions:  Respiratory failure and sepsis   Critical care was time spent personally by me on the following activities:  Development of treatment plan with patient or surrogate, discussions with consultants, evaluation of patient's response to treatment, examination of patient, ordering and review of laboratory studies, ordering and review of radiographic studies, ordering and performing treatments and interventions, pulse oximetry, re-evaluation of patient's condition and review of old charts     Medications Ordered in ED Medications  azithromycin (ZITHROMAX) 500 mg in sodium chloride 0.9 % 250 mL IVPB (500 mg Intravenous New Bag/Given 10/06/22 0710)  lactated ringers infusion ( Intravenous New Bag/Given 10/06/22 0732)  sodium chloride 0.9 % bolus 1,000 mL (0 mLs Intravenous Stopped 10/06/22 0732)  cefTRIAXone (ROCEPHIN) 1 g in sodium chloride 0.9 % 100 mL IVPB (0 g Intravenous Stopped 10/06/22 0715)  acetaminophen (TYLENOL) tablet 1,000 mg (1,000 mg Per NG tube Given 10/06/22 0745)  ipratropium-albuterol (DUONEB) 0.5-2.5 (3) MG/3ML nebulizer solution 3 mL (3 mLs Nebulization Given 10/06/22 0730)  lactated ringers bolus 500 mL (500 mLs Intravenous New Bag/Given 10/06/22 0732)    ED Course/ Medical Decision Making/ A&P Clinical Course as of 10/06/22 1132  Sun Oct 06, 2022  0757 Comprehensive metabolic panel(!) [AH]  3734 CBC with Differential(!) [AH]  0804 Lactic Acid, Venous(!!): 2.7 [AH]  2876 ED EKG 12-Lead EKG shows sinus tachycardia at a rate of 132 with nonspecific ST/T wave changes [AH]  0804 DG Chest Port 1 View Visualized and interpreted 1 view chest x-ray which shows extensive infiltrate on the right side. [AH]  8115 Patient with a rectal temperature of  101.6.  He has gotten Tylenol through his G-tube.  Patient has been given full 30 mL/kg of fluid resuscitation.  Patient getting Rocephin and azithromycin will add coverage for  [AH]  0818 Patient noted to be hypotensive.  He is on midodrine.  We will give him a dose of midodrine, reevaluate blood pressures.  He will need admission for likely aspiration pneumonia.  Along with hypoxic respiratory failure [AH]  1132 pO2, Arterial(!): 46 [AH]    Clinical Course User Index [AH] Margarita Mail, PA-C                           Medical Decision Making This patient presents to the ED for concern of hypoxic respiratory failure, this involves an extensive number of treatment options, and is a complaint that carries with it a high risk of complications and morbidity.  The emergent differential diagnosis for shortness of breath includes, but is not limited to, Pulmonary edema, bronchoconstriction, Pneumonia, Pulmonary embolism, Pneumotherax/ Hemothorax, Dysrythmia, ACS.        Co morbidities that complicate the patient evaluation       History of aspiration/aspiration pneumonia, chronic hypotension on midodrine.   Additional history obtained:  Wife at bedside Review of EMR     Cardiac Monitoring/ECG:       The patient was maintained on a cardiac monitor.  I personally viewed and interpreted the cardiac monitored which showed an underlying rhythm of: Sinus tachycardia      Medicines ordered and prescription drug management:  I ordered medication including Medications lactated ringers infusion ( Intravenous New Bag/Given 10/06/22 0732) midodrine (PROAMATINE) tablet 2.5 mg (2.5 mg Per Tube Given 10/06/22 0838) Ampicillin-Sulbactam (UNASYN) 3 g in sodium chloride 0.9 % 100 mL IVPB (has no administration in time range) enoxaparin (LOVENOX) injection 40 mg (has no administration in time range) acetaminophen (TYLENOL) tablet 650 mg (has no administration in time range)   Or acetaminophen  (TYLENOL) suppository 650 mg (has no administration in time range) ondansetron (ZOFRAN) tablet 4 mg (has no administration in time range)   Or ondansetron (ZOFRAN) injection 4 mg (has no administration in time range) albuterol (PROVENTIL) (2.5 MG/3ML) 0.083% nebulizer solution 2.5 mg (2.5 mg Nebulization Not Given 10/06/22 1016) albuterol (PROVENTIL) (2.5 MG/3ML) 0.083% nebulizer solution 2.5 mg (has no administration in time range) sodium chloride 0.9 % bolus 1,000 mL (0 mLs Intravenous Stopped 10/06/22 0732) cefTRIAXone (ROCEPHIN) 1 g in sodium chloride  0.9 % 100 mL IVPB (0 g Intravenous Stopped 10/06/22 0715) azithromycin (ZITHROMAX) 500 mg in sodium chloride 0.9 % 250 mL IVPB (500 mg Intravenous New Bag/Given 10/06/22 0710) acetaminophen (TYLENOL) tablet 1,000 mg (1,000 mg Per NG tube Given 10/06/22 0745) ipratropium-albuterol (DUONEB) 0.5-2.5 (3) MG/3ML nebulizer solution 3 mL (3 mLs Nebulization Given 10/06/22 0730) lactated ringers bolus 500 mL (500 mLs Intravenous New Bag/Given 10/06/22 0732) piperacillin-tazobactam (ZOSYN) IVPB 3.375 g (3.375 g Intravenous New Bag/Given 10/06/22 6546) for presumed aspiration pneumonia and hypoxic respiratory failure/sepsis treatment Reevaluation of the patient after these medicines showed that the patient improved I have reviewed the patients home medicines and have made adjustments as needed   Test Considered:       CT chest/CT angiogram although I have low suspicion for PE   Critical Interventions:       Fluids, oxygen supplementation broad-spectrum antibiotics     Problem List / ED Course:       (J69.0) Aspiration pneumonia of right upper lobe, unspecified aspiration pneumonia type ([place])  (primary encounter diagnosis)  (J96.01) Acute respiratory failure with hypoxia ([place])     Reevaluation:  After the interventions noted above, I reevaluated the patient and found that they have :improved   Social Determinants of Health:        Strong social support   Dispostion:  After consideration of the diagnostic results and the patients response to treatment, I feel that the patent would benefit from admission.    Amount and/or Complexity of Data Reviewed Labs: ordered. Decision-making details documented in ED Course. Radiology: independent interpretation performed. Decision-making details documented in ED Course. ECG/medicine tests: independent interpretation performed. Decision-making details documented in ED Course. Discussion of management or test interpretation with external provider(s): Dr. Tennis Must who will admit the patient  Risk OTC drugs. Prescription drug management. Decision regarding hospitalization.           Final Clinical Impression(s) / ED Diagnoses Final diagnoses:  None    Rx / DC Orders ED Discharge Orders     None         Margarita Mail, PA-C 10/06/22 1134    Lajean Saver, MD 10/06/22 1418

## 2022-10-07 ENCOUNTER — Inpatient Hospital Stay (HOSPITAL_COMMUNITY): Payer: Medicare Other

## 2022-10-07 DIAGNOSIS — G903 Multi-system degeneration of the autonomic nervous system: Secondary | ICD-10-CM

## 2022-10-07 DIAGNOSIS — K219 Gastro-esophageal reflux disease without esophagitis: Secondary | ICD-10-CM | POA: Diagnosis not present

## 2022-10-07 DIAGNOSIS — E871 Hypo-osmolality and hyponatremia: Secondary | ICD-10-CM

## 2022-10-07 DIAGNOSIS — J690 Pneumonitis due to inhalation of food and vomit: Secondary | ICD-10-CM | POA: Diagnosis not present

## 2022-10-07 MED ORDER — GUAIFENESIN 100 MG/5ML PO LIQD: 15.0000 mL | Freq: Four times a day (QID) | ORAL | Status: DC

## 2022-10-07 MED ORDER — PANTOPRAZOLE SODIUM 40 MG IV SOLR
40.0000 mg | Freq: Every day | INTRAVENOUS | Status: DC
  Administered 2022-10-07 – 2022-10-13 (×7): 40 mg via INTRAVENOUS
  Filled 2022-10-07 (×8): qty 10

## 2022-10-07 MED ORDER — HYDRALAZINE HCL 25 MG PO TABS
25.0000 mg | ORAL_TABLET | Freq: Four times a day (QID) | ORAL | Status: DC | PRN
  Administered 2022-10-08 (×2): 25 mg
  Filled 2022-10-07 (×2): qty 1

## 2022-10-07 MED ORDER — ALBUTEROL SULFATE (2.5 MG/3ML) 0.083% IN NEBU
2.5000 mg | INHALATION_SOLUTION | Freq: Three times a day (TID) | RESPIRATORY_TRACT | Status: DC
  Administered 2022-10-07 – 2022-10-15 (×23): 2.5 mg via RESPIRATORY_TRACT
  Filled 2022-10-07 (×24): qty 3

## 2022-10-07 MED ORDER — FLUTICASONE PROPIONATE 50 MCG/ACT NA SUSP
2.0000 | Freq: Every day | NASAL | Status: DC
  Administered 2022-10-07 – 2022-10-14 (×8): 2 via NASAL
  Filled 2022-10-07: qty 16

## 2022-10-07 MED ORDER — GUAIFENESIN 100 MG/5ML PO LIQD
15.0000 mL | Freq: Four times a day (QID) | ORAL | Status: DC
  Administered 2022-10-07 – 2022-10-15 (×28): 15 mL
  Filled 2022-10-07 (×29): qty 20

## 2022-10-07 MED ORDER — SALINE SPRAY 0.65 % NA SOLN
1.0000 | NASAL | Status: DC | PRN
  Administered 2022-10-09: 1 via NASAL
  Filled 2022-10-07: qty 44

## 2022-10-07 NOTE — Progress Notes (Signed)
Patient ambulated approx 200 ft on RA, sats maintained at 94% and above, pt tolerated well with no SOB

## 2022-10-07 NOTE — Progress Notes (Signed)
Mobility Specialist - Progress Note   10/07/22 1414  Mobility  Activity Ambulated independently in hallway  Level of Assistance Independent  Assistive Device None  Distance Ambulated (ft) 350 ft  Activity Response Tolerated well  Mobility Referral Yes  $Mobility charge 1 Mobility   Pt received in recliner and agreeable to mobility. No complaints during mobility.  Pt to room after session with all needs met.      [facility]

## 2022-10-07 NOTE — Progress Notes (Signed)
PROGRESS NOTE    AREEB KORBEL  VZD:[id number] DOB: 01-14-1951 DOA: 10/06/2022 PCP: Ronnald Nian, MD   Brief Narrative: ARIE DANKERS is a 71 y.o. 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  Patient presented secondary to fatigue and dyspnea after being found in the bathroom short of breath.  On admission, patient was found to have evidence of aspiration pneumonia with a right lung field infiltrate.  Empiric Unasyn started on admission.  Patient with physiology signs consistent with sepsis. Supplemental oxygen applied for acute hypoxia.   Assessment and Plan:  Sepsis Present on admission.  Secondary to aspiration pneumonia.  Blood cultures obtained on admission. Empiric Unasyn started for treatment of aspiration pneumonia. -Follow-up blood cultures  Aspiration pneumonia In setting of known dysphagia and PEG tube feeds. Imaging with right sided infiltrate consistent with infection. Empiric Unasyn initiated.  Acute respiratory failure with hypoxia Secondary to aspiration pneumonia. Patient required non-rebreather on admission and weaned down to 3.5 L/min -Wean to room air as able -Ambulatory pulse ox  Oropharyngeal cancer Dysphagia Patient is status post PEG tube.  Unsure if tube feed regimen is contributing to aspiration episodes. -Dietitian consult  Neurogenic orthostatic hypotension Noted. Supine blood pressure is running extremely high. -Discontinue midodrine for now  Chronic hyponatremia Mild. asymptomatic  GERD -Continue Protonix  DVT prophylaxis: Lovenox Code Status:   Code Status: Full Code Family Communication: Wife at bedside Disposition Plan: Discharge home possible in 1-2 days pending blood culture data and transition to outpatient  antibiotics   Consultants:  None  Procedures:  None  Antimicrobials: Unasyn    Subjective: Patient reports increased phlegm production. Weak cough. Suction is helping to clear secretions.  Objective: BP (!) 145/91 (BP Location: Right Arm)   Pulse 94   Temp 98.6 F (37 C)   Resp 18   Ht 6' (1.829 m)   Wt 74.4 kg   SpO2 94%   BMI 22.24 kg/m   Examination:  General exam: Appears calm and comfortable Respiratory system: Diminished but clear. Respiratory effort normal. Cardiovascular system: S1 & S2 heard, RRR. Gastrointestinal system: Abdomen is nondistended, soft and nontender. Normal bowel sounds heard. Central nervous system: Alert and oriented. Musculoskeletal: No edema. No calf tenderness Skin: No cyanosis. No rashes Psychiatry: Judgement and insight appear normal. Mood & affect appropriate.    Data Reviewed: I have personally reviewed following labs and imaging studies  CBC Lab Results  Component Value Date   WBC 4.0 10/06/2022   RBC 4.95 10/06/2022   HGB 14.6 10/06/2022   HCT 43.8 10/06/2022   MCV 88.5 10/06/2022   MCH 29.5 10/06/2022   PLT 175 10/06/2022   MCHC 33.3 10/06/2022   RDW 14.1 10/06/2022   LYMPHSABS 0.1 (L) 10/06/2022   MONOABS 0.1 10/06/2022   EOSABS 0.0 10/06/2022   BASOSABS 0.0 10/06/2022     Last metabolic panel Lab Results  Component Value Date   NA 134 (L) 10/06/2022   K 4.8 10/06/2022   CL 98 10/06/2022   CO2 28 10/06/2022   BUN 33 (H) 10/06/2022   CREATININE 0.97 10/06/2022   GLUCOSE 97 10/06/2022   GFRNONAA >60 10/06/2022   GFRAA 73 12/18/2020   CALCIUM 9.7 10/06/2022   PHOS 2.7 12/03/2021   PROT 8.0 10/06/2022   ALBUMIN 4.2 10/06/2022   LABGLOB 3.3 04/10/2022   AGRATIO 1.4 04/10/2022  BILITOT 0.7 10/06/2022   ALKPHOS 102 10/06/2022   AST 31 10/06/2022   ALT 26 10/06/2022   ANIONGAP 8 10/06/2022    GFR: Estimated Creatinine Clearance: 74.6 mL/min (by C-G formula based on SCr of 0.97 mg/dL).  Recent  Results (from the past 240 hour(s))  Blood Culture (routine x 2)     Status: None (Preliminary result)   Collection Time: 10/06/22  6:25 AM   Specimen: BLOOD  Result Value Ref Range Status   Specimen Description   Final    BLOOD LEFT ANTECUBITAL Performed at [facility], [address], [place], [place] [address]    Special Requests   Final    BOTTLES DRAWN AEROBIC AND ANAEROBIC Blood Culture results may not be optimal due to an excessive volume of blood received in culture bottles Performed at [facility], [address], [place], [place] [address]    Culture   Final    NO GROWTH < 24 HOURS Performed at [facility], [address], [place], [place] [address]    Report Status PENDING  Incomplete  Resp panel by RT-PCR (RSV, Flu A&B, Covid) Anterior Nasal Swab     Status: None   Collection Time: 10/06/22  6:57 AM   Specimen: Anterior Nasal Swab  Result Value Ref Range Status   SARS Coronavirus 2 by RT PCR NEGATIVE NEGATIVE Final    Comment: (NOTE) SARS-CoV-2 target nucleic acids are NOT DETECTED.  The SARS-CoV-2 RNA is generally detectable in upper respiratory specimens during the acute phase of infection. The lowest concentration of SARS-CoV-2 viral copies this assay can detect is 138 copies/mL. A negative result does not preclude SARS-Cov-2 infection and should not be used as the sole basis for treatment or other patient management decisions. A negative result may occur with  improper specimen collection/handling, submission of specimen other than nasopharyngeal swab, presence of viral mutation(s) within the areas targeted by this assay, and inadequate number of viral copies(<138 copies/mL). A negative result must be combined with clinical observations, patient history, and epidemiological information. The expected result is Negative.  Fact Sheet for Patients:  BloggerCourse.com  Fact Sheet for Healthcare  Providers:  SeriousBroker.it  This test is no t yet approved or cleared by the [place] FDA and  has been authorized for detection and/or diagnosis of SARS-CoV-2 by FDA under an Emergency Use Authorization (EUA). This EUA will remain  in effect (meaning this test can be used) for the duration of the COVID-19 declaration under Section 564(b)(1) of the Act, 21 U.S.C.section 360bbb-3(b)(1), unless the authorization is terminated  or revoked sooner.       Influenza A by PCR NEGATIVE NEGATIVE Final   Influenza B by PCR NEGATIVE NEGATIVE Final    Comment: (NOTE) The Xpert Xpress SARS-CoV-2/FLU/RSV plus assay is intended as an aid in the diagnosis of influenza from Nasopharyngeal swab specimens and should not be used as a sole basis for treatment. Nasal washings and aspirates are unacceptable for Xpert Xpress SARS-CoV-2/FLU/RSV testing.  Fact Sheet for Patients: BloggerCourse.com  Fact Sheet for Healthcare Providers: SeriousBroker.it  This test is not yet approved or cleared by the [place] FDA and has been authorized for detection and/or diagnosis of SARS-CoV-2 by FDA under an Emergency Use Authorization (EUA). This EUA will remain in effect (meaning this test can be used) for the duration of the COVID-19 declaration under Section 564(b)(1) of the Act, 21 U.S.C. section 360bbb-3(b)(1), unless the authorization is terminated or revoked.  Resp Syncytial Virus by PCR NEGATIVE NEGATIVE Final    Comment: (NOTE) Fact Sheet for Patients: BloggerCourse.com  Fact Sheet for Healthcare Providers: SeriousBroker.it  This test is not yet approved or cleared by the [place] FDA and has been authorized for detection and/or diagnosis of SARS-CoV-2 by FDA under an Emergency Use Authorization (EUA). This EUA will remain in effect (meaning this test can be  used) for the duration of the COVID-19 declaration under Section 564(b)(1) of the Act, 21 U.S.C. section 360bbb-3(b)(1), unless the authorization is terminated or revoked.  Performed at [facility], [address], [place], [place] [address]   Urine Culture     Status: None (Preliminary result)   Collection Time: 10/06/22  7:09 AM   Specimen: In/Out Cath Urine  Result Value Ref Range Status   Specimen Description   Final    IN/OUT CATH URINE Performed at [facility], [address], [place], [place] [address]    Special Requests   Final    NONE Performed at [facility], [address], [place], [place] [address]    Culture   Final    CULTURE REINCUBATED FOR BETTER GROWTH Performed at [facility], [address] [place], [place] [address]    Report Status PENDING  Incomplete  Blood Culture (routine x 2)     Status: None (Preliminary result)   Collection Time: 10/06/22  8:26 AM   Specimen: BLOOD  Result Value Ref Range Status   Specimen Description   Final    BLOOD BLOOD RIGHT HAND Performed at [facility], [address] [place], [place] [address]    Special Requests   Final    BOTTLES DRAWN AEROBIC AND ANAEROBIC Blood Culture adequate volume Performed at [facility], [address], [place], [place] [address]    Culture   Final    NO GROWTH < 24 HOURS Performed at [facility], [address], [place], [place] [address]    Report Status PENDING  Incomplete      Radiology Studies: DG Chest Port 1 View  Result Date: 10/06/2022 CLINICAL DATA:  Questionable sepsis EXAM: PORTABLE CHEST 1 VIEW COMPARISON:  07/08/2022 FINDINGS: Extensive airspace disease on the right. Chronic elevation of the right diaphragm. Normal heart size and mediastinal contours. Artifact from EKG leads. IMPRESSION: Extensive infiltrate on the right. Followup PA and lateral chest X-ray is  recommended in 3-4 weeks following trial of antibiotic therapy to ensure resolution. Chronic elevation of the right diaphragm. Electronically Signed   By: Tiburcio Pea M.D.   On: 10/06/2022 07:04      LOS: 1 day    Jacquelin Hawking, MD [facility] 10/07/2022, 12:12 PM   If 7PM-7AM, please contact night-coverage [email or web address]

## 2022-10-07 NOTE — Hospital Course (Addendum)
Lee Barnes is a 71 y.o. 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  Patient presented secondary to fatigue and dyspnea after being found in the bathroom short of breath.  On admission, patient was found to have evidence of aspiration pneumonia with a right lung field infiltrate.  Empiric Unasyn started on admission.  Patient with physiology signs consistent with sepsis. Supplemental oxygen applied for acute hypoxia and has been weaned to room air. Bolus tube feeds contributing to reflux. GI and dietitian consulted. Patient transitioned to continuous tube feeds and reached goal rate of 95 mL/hr.

## 2022-10-07 NOTE — Progress Notes (Signed)
   10/07/22 1816  Vitals  BP (!) 188/101  MAP (mmHg) 124  BP Location Left Arm  BP Method Automatic  Patient Position (if appropriate) Sitting  Pulse Rate (!) 113  MEWS COLOR  MEWS Score Color Yellow  Oxygen Therapy  SpO2 95 %  O2 Device Nasal Cannula  O2 Flow Rate (L/min) 2 L/min  MEWS Score  MEWS Temp 0  MEWS Systolic 0  MEWS Pulse 2  MEWS RR 0  MEWS LOC 0  MEWS Score 2   MD Nettey made aware of elevated BP. Pt vomiting/coughing intermittently. Pt states after his recent bolus feed he "felt too full" and became "queasy." (Per wife, pt tolerates double the amount of bolus feeds/flushes at home). Pt vomiting liquid feed, using Yankauer suction to assist. Pt sitting upright in chair. RT also at bedside to assist with suctioning. Per patient, he believes phlegm in his throat is also contributing to his gagging/nausea.   MD also made aware of tachycardia. PRN Zofran given and was effective. Pt denies any abdominal pain. States no BM today but has been passing gas. Abd XR ordered- wife and patient aware of new orders. PRN Hydralazine ordered but has not been given. Per wife, patient's BP very sensitive (increases and decreases quickly, orthostatic hypotensive at home). Recheck BP 150/94 at 1852. Will continue to monitor closely.

## 2022-10-08 DIAGNOSIS — K219 Gastro-esophageal reflux disease without esophagitis: Secondary | ICD-10-CM | POA: Diagnosis not present

## 2022-10-08 DIAGNOSIS — J690 Pneumonitis due to inhalation of food and vomit: Secondary | ICD-10-CM | POA: Diagnosis not present

## 2022-10-08 DIAGNOSIS — E871 Hypo-osmolality and hyponatremia: Secondary | ICD-10-CM | POA: Diagnosis not present

## 2022-10-08 DIAGNOSIS — G903 Multi-system degeneration of the autonomic nervous system: Secondary | ICD-10-CM | POA: Diagnosis not present

## 2022-10-08 LAB — BASIC METABOLIC PANEL
Anion gap: 10 (ref 5–15)
BUN: 18 mg/dL (ref 8–23)
CO2: 23 mmol/L (ref 22–32)
Calcium: 8.9 mg/dL (ref 8.9–10.3)
Chloride: 98 mmol/L (ref 98–111)
Creatinine, Ser: 0.69 mg/dL (ref 0.61–1.24)
GFR, Estimated: >60 mL/min (ref >60)
Glucose, Bld: 105 mg/dL — ABNORMAL HIGH (ref 70–99)
Potassium: 4.1 mmol/L (ref 3.5–5.1)
Sodium: 131 mmol/L — ABNORMAL LOW (ref 135–145)

## 2022-10-08 LAB — URINE CULTURE: Culture: 5000 — AB

## 2022-10-08 MED ORDER — OSMOLITE 1.5 CAL PO LIQD
237.0000 mL | Freq: Two times a day (BID) | ORAL | Status: DC
  Administered 2022-10-08: 237 mL
  Filled 2022-10-08 (×3): qty 237

## 2022-10-08 MED ORDER — SODIUM CHLORIDE 0.9 % IV SOLN: INTRAVENOUS | Status: DC

## 2022-10-08 MED ORDER — PROCHLORPERAZINE EDISYLATE 10 MG/2ML IJ SOLN
10.0000 mg | Freq: Four times a day (QID) | INTRAMUSCULAR | Status: DC | PRN
  Administered 2022-10-08: 10 mg via INTRAVENOUS
  Filled 2022-10-08: qty 2

## 2022-10-08 MED ORDER — PROSOURCE TF20 ENFIT COMPATIBL EN LIQD
60.0000 mL | Freq: Every day | ENTERAL | Status: DC
  Administered 2022-10-09 – 2022-10-15 (×7): 60 mL
  Filled 2022-10-08 (×8): qty 60

## 2022-10-08 MED ORDER — HYDRALAZINE HCL 10 MG PO TABS
10.0000 mg | ORAL_TABLET | Freq: Four times a day (QID) | ORAL | Status: DC
  Administered 2022-10-08 – 2022-10-15 (×26): 10 mg
  Filled 2022-10-08 (×25): qty 1

## 2022-10-08 MED ORDER — HYDROXYZINE HCL 10 MG/5ML PO SYRP
10.0000 mg | ORAL_SOLUTION | Freq: Three times a day (TID) | ORAL | Status: DC | PRN
  Filled 2022-10-08 (×2): qty 5

## 2022-10-08 MED ORDER — JEVITY 1.5 CAL/FIBER PO LIQD
237.0000 mL | Freq: Two times a day (BID) | ORAL | Status: DC
  Filled 2022-10-08 (×2): qty 237

## 2022-10-08 MED ORDER — HYDRALAZINE HCL 10 MG PO TABS
10.0000 mg | ORAL_TABLET | Freq: Four times a day (QID) | ORAL | Status: DC
  Administered 2022-10-08: 10 mg via ORAL
  Filled 2022-10-08 (×2): qty 1

## 2022-10-08 MED ORDER — HYDROXYZINE HCL 10 MG/5ML PO SYRP: 10.0000 mg | ORAL_SOLUTION | Freq: Three times a day (TID) | ORAL | Status: DC | PRN

## 2022-10-08 NOTE — Progress Notes (Signed)
PROGRESS NOTE    Lee Barnes  ZOX:[id number] DOB: [birth year] DOA: 10/06/2022 PCP: Ronnald Nian, MD   Brief Narrative: Lee Barnes is a 71 y.o. 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  Patient presented secondary to fatigue and dyspnea after being found in the bathroom short of breath.  On admission, patient was found to have evidence of aspiration pneumonia with a right lung field infiltrate.  Empiric Unasyn started on admission.  Patient with physiology signs consistent with sepsis. Supplemental oxygen applied for acute hypoxia.   Assessment and Plan:  Sepsis Present on admission.  Secondary to aspiration pneumonia.  Blood cultures obtained on admission. Empiric Unasyn started for treatment of aspiration pneumonia. Blood cultures with no growth to date.  Aspiration pneumonia In setting of known dysphagia and PEG tube feeds. Imaging with right sided infiltrate consistent with infection. Empiric Unasyn initiated. -Continue Unasyn IV  Acute respiratory failure with hypoxia Secondary to aspiration pneumonia. Patient required non-rebreather on admission and weaned down to 2 L/min -Wean to room air as able -Ambulatory pulse ox -PT/OT eval  Oropharyngeal cancer Dysphagia Patient is status post PEG tube.  Unsure if tube feed regimen is contributing to aspiration episodes. -Dietitian consult  Neurogenic orthostatic hypotension Noted. Supine blood pressure is running extremely high. -Discontinue midodrine for now  Chronic hyponatremia Mild. Asymptomatic  Uncontrolled hypertension Difficult since patient generally has hypotension. Blood pressure persistently elevated. -Hydralazine 10 mg q6 hours and continue hydralazine PRN  GERD -Continue Protonix  DVT  prophylaxis: Lovenox Code Status:   Code Status: Full Code Family Communication: Wife and two daughters at bedside Disposition Plan: Discharge home possible in 1 day pending improved toleration of tube feeds and transition to outpatient antibiotic regimen   Consultants:  None  Procedures:  None  Antimicrobials: Unasyn    Subjective: Breathing is better today, per patient. Yesterday complicated by nausea and vomiting. Still with some nausea today but no emesis.  Objective: BP (!) 196/119 (BP Location: Right Arm)   Pulse (!) 101   Temp 99.2 F (37.3 C) (Oral)   Resp 20   Ht 6' (1.829 m)   Wt 74.4 kg   SpO2 96%   BMI 22.24 kg/m   Examination:  General exam: Appears calm and comfortable. Respiratory system: Clear to auscultation. Respiratory effort normal. Cardiovascular system: S1 & S2 heard, RRR.  Gastrointestinal system: Abdomen is nondistended, soft and nontender. Normal bowel sounds heard. Central nervous system: Alert and oriented. No focal neurological deficits. Musculoskeletal: No edema. No calf tenderness Skin: No cyanosis. No rashes Psychiatry: Judgement and insight appear normal. Mood & affect appropriate.    Data Reviewed: I have personally reviewed following labs and imaging studies  CBC Lab Results  Component Value Date   WBC 4.0 10/06/2022   RBC 4.95 10/06/2022   HGB 14.6 10/06/2022   HCT 43.8 10/06/2022   MCV 88.5 10/06/2022   MCH 29.5 10/06/2022   PLT 175 10/06/2022   MCHC 33.3 10/06/2022   RDW 14.1 10/06/2022   LYMPHSABS 0.1 (L) 10/06/2022   MONOABS 0.1 10/06/2022   EOSABS 0.0 10/06/2022   BASOSABS 0.0 10/06/2022     Last metabolic panel Lab Results  Component Value Date   NA 131 (L) 10/08/2022   K 4.1 10/08/2022   CL 98 10/08/2022   CO2 23 10/08/2022   BUN 18 10/08/2022  CREATININE 0.69 10/08/2022   GLUCOSE 105 (H) 10/08/2022   GFRNONAA >60 10/08/2022   GFRAA 73 12/18/2020   CALCIUM 8.9 10/08/2022   PHOS 2.7 12/03/2021    PROT 8.0 10/06/2022   ALBUMIN 4.2 10/06/2022   LABGLOB 3.3 04/10/2022   AGRATIO 1.4 04/10/2022   BILITOT 0.7 10/06/2022   ALKPHOS 102 10/06/2022   AST 31 10/06/2022   ALT 26 10/06/2022   ANIONGAP 10 10/08/2022    GFR: Estimated Creatinine Clearance: 90.4 mL/min (by C-G formula based on SCr of 0.69 mg/dL).  Recent Results (from the past 240 hour(s))  Blood Culture (routine x 2)     Status: None (Preliminary result)   Collection Time: 10/06/22  6:25 AM   Specimen: BLOOD  Result Value Ref Range Status   Specimen Description   Final    BLOOD LEFT ANTECUBITAL Performed at [facility], [address] [place], [place] [address]    Special Requests   Final    BOTTLES DRAWN AEROBIC AND ANAEROBIC Blood Culture results may not be optimal due to an excessive volume of blood received in culture bottles Performed at [facility], [address] [place], [place] [address]    Culture   Final    NO GROWTH 2 DAYS Performed at [facility], [address] [place], [place] [address]    Report Status PENDING  Incomplete  Resp panel by RT-PCR (RSV, Flu A&B, Covid) Anterior Nasal Swab     Status: None   Collection Time: 10/06/22  6:57 AM   Specimen: Anterior Nasal Swab  Result Value Ref Range Status   SARS Coronavirus 2 by RT PCR NEGATIVE NEGATIVE Final    Comment: (NOTE) SARS-CoV-2 target nucleic acids are NOT DETECTED.  The SARS-CoV-2 RNA is generally detectable in upper respiratory specimens during the acute phase of infection. The lowest concentration of SARS-CoV-2 viral copies this assay can detect is 138 copies/mL. A negative result does not preclude SARS-Cov-2 infection and should not be used as the sole basis for treatment or other patient management decisions. A negative result may occur with  improper specimen collection/handling, submission of specimen other than nasopharyngeal swab, presence of viral mutation(s) within the areas  targeted by this assay, and inadequate number of viral copies(<138 copies/mL). A negative result must be combined with clinical observations, patient history, and epidemiological information. The expected result is Negative.  Fact Sheet for Patients:  BloggerCourse.com  Fact Sheet for Healthcare Providers:  SeriousBroker.it  This test is no t yet approved or cleared by the [place] FDA and  has been authorized for detection and/or diagnosis of SARS-CoV-2 by FDA under an Emergency Use Authorization (EUA). This EUA will remain  in effect (meaning this test can be used) for the duration of the COVID-19 declaration under Section 564(b)(1) of the Act, 21 U.S.C.section 360bbb-3(b)(1), unless the authorization is terminated  or revoked sooner.       Influenza A by PCR NEGATIVE NEGATIVE Final   Influenza B by PCR NEGATIVE NEGATIVE Final    Comment: (NOTE) The Xpert Xpress SARS-CoV-2/FLU/RSV plus assay is intended as an aid in the diagnosis of influenza from Nasopharyngeal swab specimens and should not be used as a sole basis for treatment. Nasal washings and aspirates are unacceptable for Xpert Xpress SARS-CoV-2/FLU/RSV testing.  Fact Sheet for Patients: BloggerCourse.com  Fact Sheet for Healthcare Providers: SeriousBroker.it  This test is not yet approved or cleared by the [place] and has been authorized for  detection and/or diagnosis of SARS-CoV-2 by FDA under an Emergency Use Authorization (EUA). This EUA will remain in effect (meaning this test can be used) for the duration of the COVID-19 declaration under Section 564(b)(1) of the Act, 21 U.S.C. section 360bbb-3(b)(1), unless the authorization is terminated or revoked.     Resp Syncytial Virus by PCR NEGATIVE NEGATIVE Final    Comment: (NOTE) Fact Sheet for  Patients: BloggerCourse.com  Fact Sheet for Healthcare Providers: SeriousBroker.it  This test is not yet approved or cleared by the [place] FDA and has been authorized for detection and/or diagnosis of SARS-CoV-2 by FDA under an Emergency Use Authorization (EUA). This EUA will remain in effect (meaning this test can be used) for the duration of the COVID-19 declaration under Section 564(b)(1) of the Act, 21 U.S.C. section 360bbb-3(b)(1), unless the authorization is terminated or revoked.  Performed at [facility], [address], [place], [place] [address]   Urine Culture     Status: Abnormal   Collection Time: 10/06/22  7:09 AM   Specimen: In/Out Cath Urine  Result Value Ref Range Status   Specimen Description   Final    IN/OUT CATH URINE Performed at [facility], [address], [place], [place] [address]    Special Requests   Final    NONE Performed at [facility], [address], [place], [place] [address]    Culture 5,000 COLONIES/mL STAPHYLOCOCCUS HAEMOLYTICUS (A)  Final   Report Status 10/08/2022 FINAL  Final   Organism ID, Bacteria STAPHYLOCOCCUS HAEMOLYTICUS (A)  Final      Susceptibility   Staphylococcus haemolyticus - MIC*    CIPROFLOXACIN <=0.5 SENSITIVE Sensitive     GENTAMICIN <=0.5 SENSITIVE Sensitive     NITROFURANTOIN <=16 SENSITIVE Sensitive     OXACILLIN <=0.25 SENSITIVE Sensitive     TETRACYCLINE <=1 SENSITIVE Sensitive     VANCOMYCIN 2 SENSITIVE Sensitive     TRIMETH/SULFA <=10 SENSITIVE Sensitive     CLINDAMYCIN <=0.25 SENSITIVE Sensitive     RIFAMPIN <=0.5 SENSITIVE Sensitive     Inducible Clindamycin NEGATIVE Sensitive     * 5,000 COLONIES/mL STAPHYLOCOCCUS HAEMOLYTICUS  Blood Culture (routine x 2)     Status: None (Preliminary result)   Collection Time: 10/06/22  8:26 AM   Specimen: BLOOD  Result Value Ref Range Status   Specimen  Description   Final    BLOOD BLOOD RIGHT HAND Performed at [facility], [address], [place], [place] [address]    Special Requests   Final    BOTTLES DRAWN AEROBIC AND ANAEROBIC Blood Culture adequate volume Performed at [facility], [address], [place], [place] [address]    Culture   Final    NO GROWTH 2 DAYS Performed at [facility], [address], [place], [place] [address]    Report Status PENDING  Incomplete      Radiology Studies: DG Abd Portable 1V  Result Date: 10/07/2022 CLINICAL DATA:  Nausea and vomiting. EXAM: PORTABLE ABDOMEN - 1 VIEW COMPARISON:  None Available. FINDINGS: The bowel gas pattern is normal. A stable 1.2 cm linear radiopaque density is present over the right upper quadrant and unchanged from multiple prior exams. No radio-opaque calculi or other significant radiographic abnormality are seen. IMPRESSION: Nonobstructive bowel-gas pattern. Electronically Signed   By: Thornell Sartorius M.D.   On: 10/07/2022 20:12      LOS: 2 days    Lee Hawking, MD [facility] 10/08/2022, 12:44 PM   If 7PM-7AM, please contact night-coverage [email or web address]

## 2022-10-08 NOTE — Progress Notes (Signed)
Initial Nutrition Assessment  DOCUMENTATION CODES:   Not applicable  INTERVENTION:  Tube feeding via PEG: 1.5 cartons Jevity 1.5 BID - at 0600 and 1400  1080 kcal, 46g protein, 549m  1.5 cartons Osmolite 1.5 BID - at 1000 and 1800  1080 kcals, 45g, 5495m Prosource TF20 60 ml daily In total, regimen provides 2240 kcal, 111 gm protein, 1096 ml free water daily  - Holding off on free water flushes while pt receiving IV fluids and noted hyponatremia.  - Continue home Pedialyte per tube as medically appropriate. - Monitor bowel movements.  NUTRITION DIAGNOSIS:   Swallowing difficulty related to cancer and cancer related treatments (oropharyngeal cancer s/p PEG) as evidenced by NPO status, other (comment) (on long term enteral feeds).  GOAL:   Patient will meet greater than or equal to 90% of their needs  MONITOR:   Weight trends, TF tolerance  REASON FOR ASSESSMENT:   Consult Enteral/tube feeding initiation and management  ASSESSMENT:   [age].o. 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  History of multiple episodes of aspiration pneumonia who was brought to the emergency due to hypoxia after his wife found him in the bathroom "gagging and short of breath". He has also had increased GI upset due to increased phlegm from PEG tube.  The patient stated that his feeding tube got dislodged and was reinserted at home. He saw MD that same day to check for PEG positioning.  The tube has been working without issue since then.   On admission, patient was found to have evidence of aspiration pneumonia with a right lung field infiltrate.  Empiric Unasyn started on admission.  Patient with physiology signs consistent with sepsis.   Met with patient and wife at bedside. Patient experiencing reflux and nausea  at time of visit so wife provided all nutrition history.  States patients UBW prior to starting tube feeds was 150# but after PEG and TF has gained to 164# and holding steady with no significant changes. Patient had PEG and TF started in July 2022 and initially used Jevity 1/5 and tolerated but was later switched to Isosource 1.5 and tolerated very well per wife. Prior to the past few months, patient's home TF regimen (per wife) has been Isosource 1.5 - 1.5 cans at 8a, 12p, 4p, and 8p. He also receives 12oz of Pedialyte daily due to chronic hyponatremia. Pt initially tolerating but started having more issus with reflux so their doctor recommended doing 2 cans, 3 times a day. They have been using both Isosource 1.5 and Jevity 1.5 each day as the Jevity 1.5 was sent when there started being issues with obtaining Isosource 1.5.   Discussed that volume may be making a difference in tolerance and that we could try going back to 1.5 cans, 4 times a day but starting earlier (6a, 10a, 2p, 6p) so there is more time between patient's last feed and going to bed. Also discussed trying a lower fiber formula that is also lower in fat than Isosource which may aid in faster digestion and help with reflux. As patient has been receiving a lot of fiber with current regimen would want to slowly decrease fiber to assess changes. Suggested using both Jevity 1.5 and Osmolite 1.5 during the day and assess tolerance. Wife agreeable with all changes.   Pt discussed with MD. Discussed trying changes  as above to see if new regimen would aid in symptom relief and MD in agreement. Per MD, patient's outpatient doctor suggested he may need PEG-J if symptoms persist. If changes do not impact reflux or tolerance, PEG-J would consider.     Medications reviewed and include: Pedialyte, Jevity 1.5 TID + Isosource 1.5 TID, Protonix, Imodium prn  Labs reviewed:  Na 131   NUTRITION - FOCUSED PHYSICAL EXAM:  Patient feeling nauseous and  having a lot of reflux during interview, wife and RD felt waiting on exam would be best  Diet Order:   Diet Order             Diet NPO time specified  Diet effective now                   EDUCATION NEEDS:   No education needs have been identified at this time  Skin:  Skin Assessment: Reviewed RN Assessment  Last BM:  11/19  Height:  Ht Readings from Last 1 Encounters:  10/06/22 6' (1.829 m)   Weight:  Wt Readings from Last 1 Encounters:  10/06/22 74.4 kg   Ideal Body Weight:     BMI:  Body mass index is 22.24 kg/m.  Estimated Nutritional Needs:  Kcal:  2200-2450 kcal Protein:  110-135 grams Fluid:  >/= 2.2L    Samson Frederic RD, LDN For contact information, refer to [facility].

## 2022-10-08 NOTE — Progress Notes (Signed)
PT demonstrated hands on understanding of Flutter device. 

## 2022-10-09 DIAGNOSIS — E43 Unspecified severe protein-calorie malnutrition: Secondary | ICD-10-CM | POA: Insufficient documentation

## 2022-10-09 DIAGNOSIS — G903 Multi-system degeneration of the autonomic nervous system: Secondary | ICD-10-CM | POA: Diagnosis not present

## 2022-10-09 DIAGNOSIS — E871 Hypo-osmolality and hyponatremia: Secondary | ICD-10-CM | POA: Diagnosis not present

## 2022-10-09 DIAGNOSIS — J690 Pneumonitis due to inhalation of food and vomit: Secondary | ICD-10-CM | POA: Diagnosis not present

## 2022-10-09 LAB — COMPREHENSIVE METABOLIC PANEL
ALT: 24 U/L (ref 0–44)
AST: 31 U/L (ref 15–41)
Albumin: 3.5 g/dL (ref 3.5–5.0)
Alkaline Phosphatase: 75 U/L (ref 38–126)
Anion gap: 9 (ref 5–15)
BUN: 18 mg/dL (ref 8–23)
CO2: 24 mmol/L (ref 22–32)
Calcium: 8.8 mg/dL — ABNORMAL LOW (ref 8.9–10.3)
Chloride: 99 mmol/L (ref 98–111)
Creatinine, Ser: 0.81 mg/dL (ref 0.61–1.24)
GFR, Estimated: >60 mL/min (ref >60)
Glucose, Bld: 93 mg/dL (ref 70–99)
Potassium: 4 mmol/L (ref 3.5–5.1)
Sodium: 132 mmol/L — ABNORMAL LOW (ref 135–145)
Total Bilirubin: 1 mg/dL (ref 0.3–1.2)
Total Protein: 7.6 g/dL (ref 6.5–8.1)

## 2022-10-09 LAB — CBC
HCT: 37.1 % — ABNORMAL LOW (ref 39.0–52.0)
Hemoglobin: 12 g/dL — ABNORMAL LOW (ref 13.0–17.0)
MCH: 29.1 pg (ref 26.0–34.0)
MCHC: 32.3 g/dL (ref 30.0–36.0)
MCV: 90 fL (ref 80.0–100.0)
Platelets: 148 10*3/uL — ABNORMAL LOW (ref 150–400)
RBC: 4.12 MIL/uL — ABNORMAL LOW (ref 4.22–5.81)
RDW: 14.3 % (ref 11.5–15.5)
WBC: 9.6 10*3/uL (ref 4.0–10.5)
nRBC: 0 % (ref 0.0–0.2)

## 2022-10-09 MED ORDER — ORAL CARE MOUTH RINSE: 15.0000 mL | OROMUCOSAL | Status: DC | PRN

## 2022-10-09 MED ORDER — OSMOLITE 1.5 CAL PO LIQD
356.0000 mL | Freq: Two times a day (BID) | ORAL | Status: DC
  Administered 2022-10-10 – 2022-10-11 (×3): 356 mL
  Filled 2022-10-09: qty 1000
  Filled 2022-10-09: qty 474
  Filled 2022-10-09 (×2): qty 1000
  Filled 2022-10-09: qty 474
  Filled 2022-10-09 (×2): qty 1000

## 2022-10-09 MED ORDER — JEVITY 1.5 CAL/FIBER PO LIQD
237.0000 mL | Freq: Two times a day (BID) | ORAL | Status: DC
  Filled 2022-10-09: qty 237

## 2022-10-09 MED ORDER — LABETALOL HCL 5 MG/ML IV SOLN
10.0000 mg | Freq: Four times a day (QID) | INTRAVENOUS | Status: DC | PRN
  Administered 2022-10-09: 10 mg via INTRAVENOUS
  Filled 2022-10-09 (×2): qty 4

## 2022-10-09 MED ORDER — ORAL CARE MOUTH RINSE
15.0000 mL | OROMUCOSAL | Status: DC
  Administered 2022-10-09 – 2022-10-15 (×21): 15 mL via OROMUCOSAL

## 2022-10-09 MED ORDER — JEVITY 1.5 CAL/FIBER PO LIQD
356.0000 mL | Freq: Two times a day (BID) | ORAL | Status: DC
  Administered 2022-10-09: 250 mL
  Administered 2022-10-10 – 2022-10-11 (×3): 356 mL
  Filled 2022-10-09 (×4): qty 474

## 2022-10-09 MED ORDER — LOPERAMIDE HCL 1 MG/7.5ML PO SUSP
1.0000 mg | Freq: Once | ORAL | Status: AC
  Administered 2022-10-09: 1 mg

## 2022-10-09 MED ORDER — LOPERAMIDE HCL 1 MG/7.5ML PO SUSP
1.0000 mg | Freq: Once | ORAL | Status: DC
  Filled 2022-10-09: qty 7.5

## 2022-10-09 NOTE — Plan of Care (Addendum)

## 2022-10-09 NOTE — Progress Notes (Signed)
PT Cancellation Note  Patient Details Name: Lee Barnes MRN: [id number] DOB: 1950-12-20   Cancelled Treatment:    Reason Eval/Treat Not Completed: Patient not medically ready; BP 196/113 (higher than night shift check 187/111 and after he had hydralazine this am). Continue efforts to complete PT eval   [facility] 10/09/2022, 10:59 AM

## 2022-10-09 NOTE — Progress Notes (Signed)
Nutrition Follow-up  DOCUMENTATION CODES:   Severe malnutrition in context of chronic illness  INTERVENTION:  Tube feeding via PEG: 1.5 cartons Jevity 1.5 BID - at 0600 and 1400  1080 kcal, 46g protein, 536m  1.5 cartons Osmolite 1.5 BID - at 1000 and 1800  1080 kcals, 45g, 5482m Prosource TF20 60 ml daily In total, regimen provides 2240 kcal, 111 gm protein, 1096 ml free water daily   - 5066mefore and after each bolus feed to meet fluid goals. Total free water with tube feed and free water flushes = 1496m59my.  - Monitor sodium. 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 - Continue home Pedialyte per tube as medically appropriate. - Monitor bowel movements.   NUTRITION DIAGNOSIS:   Severe Malnutrition related to chronic illness (oropharyngeal cancer with inability to eat PO s/p PEG) as evidenced by severe fat depletion, severe muscle depletion.  GOAL:   Patient will meet greater than or equal to 90% of their needs *not being met, to be met with tube feeds  MONITOR:   Weight trends, TF tolerance  REASON FOR ASSESSMENT:   Consult Enteral/tube feeding initiation and management  ASSESSMENT:   70 y66. 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  History of multiple episodes of aspiration pneumonia who was brought to the emergency due to hypoxia after his wife found him in the bathroom "gagging and short of breath". He has also had increased GI upset due to increased phlegm from PEG tube.  The patient stated that his feeding tube got dislodged and was reinserted at home. He saw MD that same day to check for PEG positioning.  The tube has been working without issue since then.   On admission, patient was found to have evidence of aspiration pneumonia with a right lung field  infiltrate.  Empiric Unasyn started on admission.  Patient with physiology signs consistent with sepsis.  Met with patient and wife at bedside to follow up. Wife reports patient did not receive tube feeds yesterday due to feeling poorly. It is documented that patient did receive a carton yesterday evening. Patient had not received morning tube feed by visit this AM. RN providing Pedialyte and meds at time of visit and plan to try to provide tube feeding bolus later this afternoon.  Discussed tube feed plan with wife - 1.5 cans of Osmolite 1.5 BID and 1.5 cans of Jevity 1.5 BID for total of 6 cans/day + PS x1. Discussed times of boluses, starting earlier at 6am so patient should get last can at 6pm which will hopefully provide enough time to prevent reflux once going to bed. Wife would like to start with 50mL67m before and after tube feeding to assess tolerance (previously received 100mL 56mre and after). Will put free water flushes in tube feed order now that off IV fluids.  Wife endorsed understanding and in agreement with plan. Will closely monitor tolerance.   Medications reviewed and include: Pedialyte, Jevity 1.5 TID + Isosource 1.5 TID, Protonix, Imodium prn   Labs reviewed:  Na 131  NUTRITION - FOCUSED PHYSICAL EXAM:  Flowsheet Row Most Recent Value  Orbital Region Severe depletion  Upper Arm Region Severe depletion  Thoracic and Lumbar Region Severe depletion  Buccal Region Severe depletion  Temple Region Severe depletion  Clavicle Bone Region Severe depletion  Clavicle and Acromion Bone Region  Moderate depletion  Scapular Bone Region Unable to assess  Dorsal Hand Moderate depletion  Patellar Region Moderate depletion  Anterior Thigh Region Moderate depletion  Posterior Calf Region Moderate depletion  Edema (RD Assessment) None  Hair Reviewed  Eyes Reviewed  Mouth Reviewed  Skin Reviewed  Nails Reviewed       Diet Order:   Diet Order             Diet NPO time  specified  Diet effective now                   EDUCATION NEEDS:  No education needs have been identified at this time  Skin:  Skin Assessment: Reviewed RN Assessment  Last BM:  11/19  Height:  Ht Readings from Last 1 Encounters:  10/06/22 6' (1.829 m)   Weight:  Wt Readings from Last 1 Encounters:  10/06/22 74.4 kg    BMI:  Body mass index is 22.24 kg/m.  Estimated Nutritional Needs:  Kcal:  2200-2450 kcal Protein:  110-135 grams Fluid:  >/= 2.2L    Samson Frederic RD, LDN For contact information, refer to [facility].

## 2022-10-09 NOTE — Progress Notes (Signed)
Patient's BP this morning was 187/111. Gave scheduled hydralazine. Will continue to monitor and pass along to dayshift RN.

## 2022-10-09 NOTE — Progress Notes (Signed)
Patient at rest in chair oxygen of 2 liters removed and patient experienced desaturation at rest 86%. 2 liters reapplied saturation increased 95%.

## 2022-10-09 NOTE — Progress Notes (Signed)
OT Cancellation Note  Patient Details Name: Lee Barnes MRN: [id number] DOB: 07-Jul-1951   Cancelled Treatment:    Reason Eval/Treat Not Completed: Medical issues which prohibited therapy Patient with elevated BP today with medications already onboard. OT to continue to follow and check back as schedule will allow. Rennie Plowman, MS Acute Rehabilitation Department Office# [phone] 10/09/2022, 12:18 PM

## 2022-10-09 NOTE — Care Management Important Message (Signed)
Important Message  Patient Details IM Letter given Name: Lee Barnes MRN: [id number] Date of Birth: 10/01/1951   Medicare Important Message Given:  Yes     Kerin Salen 10/09/2022, 9:19 AM

## 2022-10-09 NOTE — Progress Notes (Signed)
Progress Note   Patient: Lee Barnes ZOX:[id number] DOB: 10-17-1951 DOA: 10/06/2022     3 DOS: the patient was seen and examined on 10/09/2022 at 9:42AM      Brief hospital course: ABUBAKAR RENSEL is a 71 y.o. 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  Patient presented secondary to fatigue and dyspnea after being found in the bathroom short of breath.  On admission, patient was found to have evidence of aspiration pneumonia with a right lung field infiltrate.  Empiric Unasyn started on admission.  Patient with physiology signs consistent with sepsis. Supplemental oxygen applied for acute hypoxia.     Assessment and Plan: Sepsis due to aspiration pneumonia Still fever overnight, still tachycardic still requiring oxygen -Continue Unasyn -Aggressive pulmonary toilet   Acute respiratory failure with hypoxia due to aspiration pneumonia - Wean oxygen as able  Oropharyngeal cancer Status post PEG tube. - Consult dietitian - Continue tube feeds  Chronic orthostatic hypotension -Hold midodrine  Hyponatremia Stable relative to baseline, asymptomatic  Hypertensive urgency -Continue as needed hydralazine, add as needed labetalol        Subjective: Feeling gradually better, no chest pain, slightly out of breath but improved from previous, no confusion, vomiting     Physical Exam: BP (!) 195/114 (BP Location: Right Arm)   Pulse (!) 106   Temp 98.9 F (37.2 C) (Oral)   Resp 20   Ht 6' (1.829 m)   Wt 74.4 kg   SpO2 96%   BMI 22.24 kg/m   Thin adult male, sitting up in bed, no acute distress Tachycardic, regular, no murmurs, no peripheral edema Respiratory rate normal, lungs clear without rales or wheezes Abdomen soft without tenderness palpation or guarding,  no ascites or distention Attention normal, affect appropriate, judgment and insight appear normal, face symmetric, speech hoarse     Data Reviewed: Urine cultures growing staph hemolyticus CBC normal CMP shows chronic hyponatremia, otherwise normal  Family Communication: Wife at the bedside    Disposition: Status is: Inpatient Patient requires ongoing IV antibiotics in the setting of persistent fever, persistent tachycardia, persistent hypoxia requiring supplemental oxygen        Author: Alberteen Sam, MD 10/09/2022 4:03 PM  For on call review [email or web address].

## 2022-10-09 NOTE — TOC Initial Note (Signed)
Transition of Care [facility]) - Initial/Assessment Note    Patient Details  Name: Lee Barnes MRN: [id number] Date of Birth: 1951/02/01  Transition of Care [facility]) CM/SW Contact:    Golda Acre, RN Phone Number: 10/09/2022, 7:41 AM  Clinical Narrative:                  Transition of Care [facility]) Screening Note   Patient Details  Name: Lee Barnes Date of Birth: 1951-07-31   Transition of Care [facility]) CM/SW Contact:    Golda Acre, RN Phone Number: 10/09/2022, 7:41 AM    Transition of Care Department [facility]) has reviewed patient and no TOC needs have been identified at this time. We will continue to monitor patient advancement through interdisciplinary progression rounds. If new patient transition needs arise, please place a TOC consult.    Expected Discharge Plan: Home/Self Care Barriers to Discharge: Continued Medical Work up   Patient Goals and CMS Choice Patient states their goals for this hospitalization and ongoing recovery are:: to go home for the holidays CMS Medicare.gov Compare Post Acute Care list provided to:: Patient    Expected Discharge Plan and Services Expected Discharge Plan: Home/Self Care   Discharge Planning Services: CM Consult   Living arrangements for the past 2 months: Single Family Home                                      Prior Living Arrangements/Services Living arrangements for the past 2 months: Single Family Home Lives with:: Spouse Patient language and need for interpreter reviewed:: Yes Do you feel safe going back to the place where you live?: Yes            Criminal Activity/Legal Involvement Pertinent to Current Situation/Hospitalization: No - Comment as needed  Activities of Daily Living Home Assistive Devices/Equipment: Hearing aid, Eyeglasses, Blood pressure cuff ADL Screening (condition at time of admission) Patient's cognitive ability adequate to safely complete daily activities?: Yes Is the  patient deaf or have difficulty hearing?: Yes Does the patient have difficulty seeing, even when wearing glasses/contacts?: No Does the patient have difficulty concentrating, remembering, or making decisions?: No Patient able to express need for assistance with ADLs?: Yes Does the patient have difficulty dressing or bathing?: Yes Independently performs ADLs?: Yes (appropriate for developmental age) Does the patient have difficulty walking or climbing stairs?: No Weakness of Legs: None Weakness of Arms/Hands: Right  Permission Sought/Granted                  Emotional Assessment Appearance:: Appears stated age Attitude/Demeanor/Rapport: Engaged Affect (typically observed): Calm Orientation: : Oriented to Self, Oriented to Place, Oriented to  Time, Oriented to Situation Alcohol / Substance Use: Alcohol Use (occasional) Psych Involvement: No (comment)  Admission diagnosis:  Aspiration pneumonia (HCC) [J69.0] Acute respiratory failure with hypoxia (HCC) [J96.01] Aspiration pneumonia of right upper lobe, unspecified aspiration pneumonia type [facility]) [J69.0] Patient Active Problem List   Diagnosis Date Noted   Basal cell carcinoma of left cheek 07/17/2022   Malnutrition of moderate degree 06/27/2022   GERD (gastroesophageal reflux disease) 06/27/2022   On tube feeding diet 06/27/2022   Aspiration pneumonia (HCC) 06/26/2022   Chronic hyponatremia 02/20/2021   Sepsis (HCC) 02/20/2021   Dysphagia 12/12/2020   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   ACE-inhibitor cough 05/25/2015   Essential hypertension 03/21/2015  Allergic rhinitis 12/14/2013   ED (erectile dysfunction) 12/14/2013   Diverticulosis    History of oropharyngeal cancer 09/18/2010   PCP:  Ronnald Nian, MD Pharmacy:   [facility] [id number] - [place], [place] - [address] [place] [address] Phone: [phone] Fax:  [phone]     Social Determinants of Health (SDOH) Interventions    Readmission Risk Interventions   No data to display

## 2022-10-10 ENCOUNTER — Inpatient Hospital Stay (HOSPITAL_COMMUNITY): Payer: Medicare Other

## 2022-10-10 DIAGNOSIS — D649 Anemia, unspecified: Secondary | ICD-10-CM | POA: Insufficient documentation

## 2022-10-10 DIAGNOSIS — J690 Pneumonitis due to inhalation of food and vomit: Secondary | ICD-10-CM | POA: Diagnosis not present

## 2022-10-10 DIAGNOSIS — E43 Unspecified severe protein-calorie malnutrition: Secondary | ICD-10-CM | POA: Diagnosis not present

## 2022-10-10 DIAGNOSIS — I160 Hypertensive urgency: Secondary | ICD-10-CM | POA: Insufficient documentation

## 2022-10-10 DIAGNOSIS — G903 Multi-system degeneration of the autonomic nervous system: Secondary | ICD-10-CM | POA: Diagnosis not present

## 2022-10-10 DIAGNOSIS — E871 Hypo-osmolality and hyponatremia: Secondary | ICD-10-CM | POA: Diagnosis not present

## 2022-10-10 LAB — COMPREHENSIVE METABOLIC PANEL
ALT: 22 U/L (ref 0–44)
AST: 25 U/L (ref 15–41)
Albumin: 2.8 g/dL — ABNORMAL LOW (ref 3.5–5.0)
Alkaline Phosphatase: 60 U/L (ref 38–126)
Anion gap: 9 (ref 5–15)
BUN: 19 mg/dL (ref 8–23)
CO2: 25 mmol/L (ref 22–32)
Calcium: 8.3 mg/dL — ABNORMAL LOW (ref 8.9–10.3)
Chloride: 98 mmol/L (ref 98–111)
Creatinine, Ser: 0.74 mg/dL (ref 0.61–1.24)
GFR, Estimated: >60 mL/min (ref >60)
Glucose, Bld: 84 mg/dL (ref 70–99)
Potassium: 3.8 mmol/L (ref 3.5–5.1)
Sodium: 132 mmol/L — ABNORMAL LOW (ref 135–145)
Total Bilirubin: 0.8 mg/dL (ref 0.3–1.2)
Total Protein: 6.1 g/dL — ABNORMAL LOW (ref 6.5–8.1)

## 2022-10-10 LAB — CBC
HCT: 30.5 % — ABNORMAL LOW (ref 39.0–52.0)
Hemoglobin: 10 g/dL — ABNORMAL LOW (ref 13.0–17.0)
MCH: 29.3 pg (ref 26.0–34.0)
MCHC: 32.8 g/dL (ref 30.0–36.0)
MCV: 89.4 fL (ref 80.0–100.0)
Platelets: 152 10*3/uL (ref 150–400)
RBC: 3.41 MIL/uL — ABNORMAL LOW (ref 4.22–5.81)
RDW: 14.3 % (ref 11.5–15.5)
WBC: 7.3 10*3/uL (ref 4.0–10.5)
nRBC: 0 % (ref 0.0–0.2)

## 2022-10-10 NOTE — Assessment & Plan Note (Addendum)
Reflux Dysphagia Status post PEG tube Has had recurrent and difficult problems with reflux here. Patient had tolerated bolus feedings without issue for almost a yaer until this past August Wife relates that reflux started back in August and shortly after started to have episodic wheezing, gagging, and has had now 3 episodes of aspiration pneumonia. During this hospital stay, several times, after bolus tube feeds, the patient has had a sensation that he could taste tube feed or medicine (like cough medicine recently administered via PEG) in his mouth, or that he blew his nose and tube feed liquid came out.   We tried taking a break.  We tried using smaller feeds, but nothing helped.  I consulted GI to evaluate whether this was related to a hiatal hernia, gastroparesis, or some other treatable cause of new reflux, but they could offer no new insights.  11/24 we started continuous tube feeds - Consult dietitian, continue tube feeds cautiously - TOC consult for tube feed pump, supplies and HH - Also will need suction at home - Also will need nebulizer for albuterol at home

## 2022-10-10 NOTE — Progress Notes (Signed)
Progress Note   Patient: Lee Barnes [id number] DOB: Nov 24, 1950 DOA: 10/06/2022     4 DOS: the patient was seen and examined on 10/10/2022 at 10:05AM      Brief hospital course: Lee Barnes is a 71 y.o. M iwht history of oropharyngeal cancer status postradiation, PEG tube placement, repeated aspiration pneumonia, and chronic orthostasis and chronic hyponatremia who presented with dyspnea and fatigue.  In the ER he had sepsis and extensive right lung infiltrates.        Assessment and Plan: * Sepsis due to aspiration pneumonia Presented with fever to 101, tachycardia to 137, Blood pressure as low as 80/40 in the ER in the setting of an aspiration pneumonia.  Also found to have respiratory failure due to pneumonia.  Febrile yesterday, and still on supplemental oxygen now, but overall improving - Continue Unasyn - Aggressive pulmonary toilet     Normocytic anemia Hemoglobin has trended down to 10 in the setting of his critical illness  Hypertensive urgency - Hold midodrine - Check orthostatics - If orthostatics are okay I might start amlodipine  Protein-calorie malnutrition, severe As evidenced by severe loss of subcutaneous muscle mass and fat, inability to fully tolerate his PEG tube feeds, and n.p.o. status for over 1 year  GERD (gastroesophageal reflux disease) - Continue PPI  Acute respiratory failure with hypoxia (HCC) Presented with gagging and shortness of breath, tachypnea, arterial PO2 in the 40s requiring up to 5 L supplemental oxygen.  Weaning down now, but still on oxygen.  Chronic hyponatremia Sodium 133, stable, asymptomatic  Neurogenic orthostatic hypotension (HCC) Blood pressure extremely high here, has chronic orthostasis - Check orthostatics - Hold midodrine  History of oropharyngeal cancer Status post PEG tube - Consult dietitian, continue tube feeds cautiously          Subjective: Patient had a lot of reflux, taste of cough  syrup in his mouth after tube feeds yesterday afternoon, increased cough and wheezing yesterday, but this resolved well overnight, he slept well, today he feels good but has been weaned down to 1 L of oxygen.  No fever last 24 hours     Physical Exam: BP (!) 174/96   Pulse (!) 102   Temp 99 F (37.2 C) (Oral)   Resp 16   Ht 6' (1.829 m)   Wt 74.4 kg   SpO2 93%   BMI 22.24 kg/m   Thin adult male, sitting up in recliner, interactive and appropriate, voice hoarse Heart tachycardic but regular, no murmurs, no peripheral edema, no JVD Respiratory effort appears normal, does not appear out of breath with speaking, somewhat diminished on the right, coarse bilaterally but no real rales or wheezing Abdomen without distention Attention normal, affect appropriate, judgment insight appear normal, face symmetric    Data Reviewed: Sodium 132, no change, otherwise basic metabolic panel shows normal creatinine and potassium Hemoglobin down to 10 Lactates no change Chest x-ray, personally reviewed, shows improved right lung opacity from previous   Family Communication: Wife at the bedside    Disposition: Status is: Inpatient Patient requires ongoing IV antibiotics, hopefully stable for discharge tomorrow         Author: Alberteen Sam, MD 10/10/2022 3:39 PM  For on call review [email or web address].

## 2022-10-10 NOTE — Assessment & Plan Note (Signed)
As evidenced by severe loss of subcutaneous muscle mass and fat, inability to fully tolerate his PEG tube feeds, and n.p.o. status for over 1 year

## 2022-10-10 NOTE — Assessment & Plan Note (Signed)
Sodium 133, stable, asymptomatic

## 2022-10-10 NOTE — Assessment & Plan Note (Addendum)
Blood pressure extremely high here, has chronic orthostasis - Check orthostatics - Hold midodrine

## 2022-10-10 NOTE — Assessment & Plan Note (Addendum)
Presented with fever to 101, tachycardia to 137, Blood pressure as low as 80/40 in the ER in the setting of an aspiration pneumonia.  Also found to have respiratory failure due to pneumonia.  Fever resolved.  Fever curve improved yesterday and O2 resolved to baseline today.  Given several ongoing aspiration events during treatment and slow recovery, will continue Unasyn 7 days - Continue Unasyn day 6 of 7 - Aggressive pulmonary toilet

## 2022-10-10 NOTE — Evaluation (Signed)
Physical Therapy Evaluation Patient Details Name: Lee Barnes MRN: [id number] DOB: 1951/06/29 Today's Date: 10/10/2022  History of Present Illness  Pt is a 71 y/o male admitted for aspiration PNA. PMH: basal cell carcinoma of L cheek, diverticulosis, childhood heart murmur, HTN, oropharyngeal cancer, tinnitis, hx of chemo and radiation, PEG tube placement.  Clinical Impression  Pt admitted with/for asp PNA.  Pt generally supervision to mod I for mobility, but deconditioned and having some trouble maintaining SpO2 on RA.Marland Kitchen  Pt currently limited functionally due to the problems listed below.  (see problems list.)  Pt will benefit from PT to maximize function and safety to be able to get home safely with available assist .        Recommendations for follow up therapy are one component of a multi-disciplinary discharge planning process, led by the attending physician.  Recommendations may be updated based on patient status, additional functional criteria and insurance authorization.  Follow Up Recommendations No PT follow up      Assistance Recommended at Discharge PRN  Patient can return home with the following  A little help with bathing/dressing/bathroom;A little help with walking and/or transfers;Help with stairs or ramp for entrance    Equipment Recommendations None recommended by PT  Recommendations for Other Services       Functional Status Assessment Patient has had a recent decline in their functional status and demonstrates the ability to make significant improvements in function in a reasonable and predictable amount of time.     Precautions / Restrictions Precautions Precautions: Fall;Other (comment) Precaution Comments: monitor O2, PEG tube Restrictions Weight Bearing Restrictions: No      Mobility  Bed Mobility Overal bed mobility: Modified Independent                  Transfers Overall transfer level: Independent Equipment used: None                     Ambulation/Gait Ambulation/Gait assistance: Supervision Gait Distance (Feet): 400 Feet Assistive device: None Gait Pattern/deviations: Step-through pattern   Gait velocity interpretation: >2.62 ft/sec, indicative of community ambulatory   General Gait Details: generally steady, able to attain a burst of speed at age-appropriate levels.  SpO2 generally 90-91 % on RA during activity, but after 400 feet or so, sats finally dropped to 87% with good PLETH.  After 10 sec rest, sats back up to 91/92% on RA.  At rest post ambulation, sats at 93%.  Stairs            Wheelchair Mobility    Modified Rankin (Stroke Patients Only)       Balance Overall balance assessment: Needs assistance Sitting-balance support: No upper extremity supported, Feet supported Sitting balance-Leahy Scale: Normal     Standing balance support: No upper extremity supported, During functional activity Standing balance-Leahy Scale: Good                               Pertinent Vitals/Pain Pain Assessment Pain Assessment: No/denies pain    Home Living Family/patient expects to be discharged to:: Private residence Living Arrangements: Spouse/significant other Available Help at Discharge: Family;Available 24 hours/day Type of Home: House Home Access: Stairs to enter   [employer] of Steps: 2   Home Layout: Two level;Able to live on main level with bedroom/bathroom Home Equipment: Shower seat - built in      Prior Function Prior Level of Function : Independent/Modified  Independent             Mobility Comments: no use of AD ADLs Comments: Independent with ADLs, wife completes iADLs. pt enjoys spending time with grandchildren. wife assisting with tube feeds     Hand Dominance   Dominant Hand: Right    Extremity/Trunk Assessment   Upper Extremity Assessment Upper Extremity Assessment: Overall WFL for tasks assessed    Lower Extremity  Assessment Lower Extremity Assessment: Overall WFL for tasks assessed    Cervical / Trunk Assessment Cervical / Trunk Assessment: Normal  Communication   Communication: HOH  Cognition Arousal/Alertness: Awake/alert Behavior During Therapy: WFL for tasks assessed/performed Overall Cognitive Status: Within Functional Limits for tasks assessed                                          General Comments General comments (skin integrity, edema, etc.): Wife at bedside and then 3 daughters entering.    Exercises     Assessment/Plan    PT Assessment Patient needs continued PT services  PT Problem List Decreased activity tolerance;Cardiopulmonary status limiting activity       PT Treatment Interventions Gait training;Functional mobility training;Therapeutic activities;Stair training;Patient/family education    PT Goals (Current goals can be found in the Care Plan section)  Acute Rehab PT Goals Patient Stated Goal: home soon PT Goal Formulation: With patient Time For Goal Achievement: 10/17/22 Potential to Achieve Goals: Good    Frequency Other (Comment) (1 more rx to check SpO2 with activity and complete stairs.)     Co-evaluation               AM-PAC PT "6 Clicks" Mobility  Outcome Measure Help needed turning from your back to your side while in a flat bed without using bedrails?: None Help needed moving from lying on your back to sitting on the side of a flat bed without using bedrails?: None Help needed moving to and from a bed to a chair (including a wheelchair)?: A Little Help needed standing up from a chair using your arms (e.g., wheelchair or bedside chair)?: None Help needed to walk in hospital room?: A Little Help needed climbing 3-5 steps with a railing? : A Little 6 Click Score: 21    End of Session   Activity Tolerance: Patient tolerated treatment well Patient left: in bed;with call bell/phone within reach;with family/visitor  present Nurse Communication: Mobility status PT Visit Diagnosis: Difficulty in walking, not elsewhere classified (R26.2)    Time: 5643-3295 PT Time Calculation (min) (ACUTE ONLY): 21 min   Charges:   PT Evaluation $PT Eval Moderate Complexity: 1 Mod          10/10/2022  Jacinto Halim., PT Acute Rehabilitation Services [phone]  (office)  Eliseo Gum Annarose Ouellet 10/10/2022, 11:30 AM

## 2022-10-10 NOTE — Assessment & Plan Note (Signed)
Presented with gagging and shortness of breath, tachypnea, arterial PO2 in the 40s requiring up to 5 L supplemental oxygen.  Weaning down now, but still on oxygen.

## 2022-10-10 NOTE — Progress Notes (Signed)
Mobility Specialist - Progress Note  Pre-mobility: 91% SpO2 During mobility: 86% SpO2 Post-mobility: 91% SPO2   10/10/22 1439  Mobility  Activity Ambulated independently in hallway  Level of Assistance Independent  Assistive Device None  Distance Ambulated (ft) 700 ft  Range of Motion/Exercises Active  Activity Response Tolerated well  Mobility Referral Yes  $Mobility charge 1 Mobility   Pt was found on recliner chair and agreeable to ambulate. Although SPO2 would drop is able to recover to 89% during ambulation within seconds and at EOS returned to recliner chair with family in room. RN notified of session.  Ferd Hibbs Mobility Specialist

## 2022-10-10 NOTE — Plan of Care (Signed)

## 2022-10-10 NOTE — Evaluation (Signed)
Occupational Therapy Evaluation/Discharge Patient Details Name: Lee Barnes MRN: [id number] DOB: 09/26/1951 Today's Date: 10/10/2022   History of Present Illness Pt is a 71 y/o male admitted for aspiration PNA. PMH: basal cell carcinoma of L cheek, diverticulosis, childhood heart murmur, HTN, oropharyngeal cancer, tinnitis, hx of chemo and radiation, PEG tube placement.   Clinical Impression   PTA, pt lives with spouse, typically Independent with ADLs and mobility while wife assists with tube feedings and IADLs. Pt presents now at baseline for ADLs and in-room mobility without physical assistance needed. Pt denies any concerns with mgmt at home and has wife to assist as needed. Anticipate with continued mobility that pt cardiopulmonary endurance will also improve. OT to sign off at acute level. Pt functionally appropriate for DC home once medically cleared.  SpO2  95% on RA at rest, 91% with in-room mobility on RA. See pending PT evaluation for further ambulatory sats.     Recommendations for follow up therapy are one component of a multi-disciplinary discharge planning process, led by the attending physician.  Recommendations may be updated based on patient status, additional functional criteria and insurance authorization.   Follow Up Recommendations  No OT follow up     Assistance Recommended at Discharge PRN  Patient can return home with the following      Functional Status Assessment  Patient has not had a recent decline in their functional status  Equipment Recommendations  None recommended by OT    Recommendations for Other Services       Precautions / Restrictions Precautions Precautions: Fall;Other (comment) Precaution Comments: monitor O2, PEG tube Restrictions Weight Bearing Restrictions: No      Mobility Bed Mobility Overal bed mobility: Modified Independent                  Transfers Overall transfer level: Independent Equipment used: None                       Balance Overall balance assessment: Needs assistance Sitting-balance support: No upper extremity supported, Feet supported Sitting balance-Leahy Scale: Normal     Standing balance support: No upper extremity supported, During functional activity Standing balance-Leahy Scale: Good                             ADL either performed or assessed with clinical judgement   ADL Overall ADL's : At baseline;Modified independent                                       General ADL Comments: able to reach B feet easily for simulated LB dressing,     Vision Baseline Vision/History: 1 Wears glasses Ability to See in Adequate Light: 0 Adequate Patient Visual Report: No change from baseline Vision Assessment?: No apparent visual deficits     Perception     Praxis      Pertinent Vitals/Pain Pain Assessment Pain Assessment: No/denies pain     Hand Dominance Right   Extremity/Trunk Assessment Upper Extremity Assessment Upper Extremity Assessment: Overall WFL for tasks assessed   Lower Extremity Assessment Lower Extremity Assessment: Defer to PT evaluation   Cervical / Trunk Assessment Cervical / Trunk Assessment: Normal   Communication Communication Communication: HOH   Cognition Arousal/Alertness: Awake/alert Behavior During Therapy: WFL for tasks assessed/performed Overall Cognitive Status: Within Functional Limits for tasks assessed  General Comments  Wife at bedside and then 3 daughters entering.    Exercises     Shoulder Instructions      Home Living Family/patient expects to be discharged to:: Private residence Living Arrangements: Spouse/significant other Available Help at Discharge: Family;Available 24 hours/day Type of Home: House Home Access: Stairs to enter [employer] of Steps: 2   Home Layout: Two level;Able to live on main level with  bedroom/bathroom     Bathroom Shower/Tub: Tub/shower unit;Walk-in Human resources officer: Standard     Home Equipment: Shower seat - built in          Prior Functioning/Environment Prior Level of Function : Independent/Modified Independent             Mobility Comments: no use of AD ADLs Comments: Independent with ADLs, wife completes iADLs. pt enjoys spending time with grandchildren. wife assisting with tube feeds        OT Problem List:        OT Treatment/Interventions:      OT Goals(Current goals can be found in the care plan section) Acute Rehab OT Goals Patient Stated Goal: home tomorrow, get off of O2 OT Goal Formulation: All assessment and education complete, DC therapy  OT Frequency:      Co-evaluation              AM-PAC OT "6 Clicks" Daily Activity     Outcome Measure Help from another person eating meals?: None Help from another person taking care of personal grooming?: None Help from another person toileting, which includes using toliet, bedpan, or urinal?: None Help from another person bathing (including washing, rinsing, drying)?: None Help from another person to put on and taking off regular upper body clothing?: None Help from another person to put on and taking off regular lower body clothing?: None 6 Click Score: 24   End of Session Nurse Communication: Mobility status  Activity Tolerance: Patient tolerated treatment well Patient left: in bed;with call bell/phone within reach;with bed alarm set;with family/visitor present;Other (comment) (PT entering)  OT Visit Diagnosis: Muscle weakness (generalized) (M62.81)                Time: 6962-9528 OT Time Calculation (min): 14 min Charges:  OT General Charges $OT Visit: 1 Visit OT Evaluation $OT Eval Low Complexity: 1 Low  [name], OTR/L Acute Rehab Services Office: [phone]   [name] 10/10/2022, 11:08 AM

## 2022-10-10 NOTE — Assessment & Plan Note (Signed)
Hemoglobin has trended down to 10 in the setting of his critical illness

## 2022-10-10 NOTE — Assessment & Plan Note (Signed)
Continue PPI ?

## 2022-10-10 NOTE — Assessment & Plan Note (Signed)
-   Hold midodrine - Check orthostatics - If orthostatics are okay I might start amlodipine

## 2022-10-11 DIAGNOSIS — J9601 Acute respiratory failure with hypoxia: Secondary | ICD-10-CM | POA: Diagnosis not present

## 2022-10-11 DIAGNOSIS — Z85819 Personal history of malignant neoplasm of unspecified site of lip, oral cavity, and pharynx: Secondary | ICD-10-CM | POA: Diagnosis not present

## 2022-10-11 LAB — CULTURE, BLOOD (ROUTINE X 2)
Culture: NO GROWTH
Culture: NO GROWTH
Special Requests: ADEQUATE

## 2022-10-11 LAB — CBC
HCT: 31.5 % — ABNORMAL LOW (ref 39.0–52.0)
Hemoglobin: 10.1 g/dL — ABNORMAL LOW (ref 13.0–17.0)
MCH: 29 pg (ref 26.0–34.0)
MCHC: 32.1 g/dL (ref 30.0–36.0)
MCV: 90.5 fL (ref 80.0–100.0)
Platelets: 153 10*3/uL (ref 150–400)
RBC: 3.48 MIL/uL — ABNORMAL LOW (ref 4.22–5.81)
RDW: 14.4 % (ref 11.5–15.5)
WBC: 6.5 10*3/uL (ref 4.0–10.5)
nRBC: 0 % (ref 0.0–0.2)

## 2022-10-11 LAB — BASIC METABOLIC PANEL
Anion gap: 10 (ref 5–15)
BUN: 20 mg/dL (ref 8–23)
CO2: 25 mmol/L (ref 22–32)
Calcium: 8.3 mg/dL — ABNORMAL LOW (ref 8.9–10.3)
Chloride: 97 mmol/L — ABNORMAL LOW (ref 98–111)
Creatinine, Ser: 0.74 mg/dL (ref 0.61–1.24)
GFR, Estimated: >60 mL/min (ref >60)
Glucose, Bld: 92 mg/dL (ref 70–99)
Potassium: 3.7 mmol/L (ref 3.5–5.1)
Sodium: 132 mmol/L — ABNORMAL LOW (ref 135–145)

## 2022-10-11 MED ORDER — OSMOLITE 1.2 CAL PO LIQD
1470.0000 mL | ORAL | Status: DC
  Administered 2022-10-12 (×2): 1470 mL
  Administered 2022-10-13: 1000 mL
  Administered 2022-10-14: 1470 mL
  Filled 2022-10-11 (×3): qty 2000

## 2022-10-11 NOTE — Progress Notes (Signed)
Progress Note   Patient: Lee Barnes VWU:[id number] DOB: Feb 19, 1951 DOA: 10/06/2022     5 DOS: the patient was seen and examined on 10/11/2022        Brief hospital course: Mr. Howle is a 71 y.o. M with history of oropharyngeal cancer status postradiation, PEG tube placement, repeated aspiration pneumonia, and chronic orthostasis and chronic hyponatremia who presented with dyspnea and fatigue.  In the ER he had sepsis and extensive right lung infiltrates.        Assessment and Plan: * Sepsis due to aspiration pneumonia  Improving - Continue Unasyn - Aggressive pulmonary toilet       Hypertensive urgency BP better today, still labile.  Orthostatic yesterday. - Hold midodrine  Protein-calorie malnutrition, severe Needs attention  GERD (gastroesophageal reflux disease) - Continue PPI  Acute respiratory failure with hypoxia (HCC) Resolving  Chronic hyponatremia Stable  Neurogenic orthostatic hypotension (HCC) - Hold midodrine  History of oropharyngeal cancer Status post PEG tube - Consult GI - Consult dietitian, continue tube feeds cautiously          Subjective: Doing better, still with some coughing after some tube feed episodes.     Physical Exam: BP (!) 185/104   Pulse 96   Temp 98.4 F (36.9 C) (Oral)   Resp 18   Ht 6' (1.829 m)   Wt 74.4 kg   SpO2 90%   BMI 22.24 kg/m   Thin adult male, sitting up in recliner, interactive and appropriate, voice hoarse Heart tachycardic but regular, no murmurs, no peripheral edema, no JVD Respiratory effort appears normal, does not appear out of breath with speaking, somewhat diminished on the right, coarse bilaterally but no real rales or wheezing Abdomen without distention Attention normal, affect appropriate, judgment insight appear normal, face symmetric    Data Reviewed: CBC and BMP unchanged.   Family Communication: Wife at the bedside    Disposition: Status is: Inpatient            Author: Alberteen Sam, MD 10/11/2022 6:26 PM  For on call review [email or web address].

## 2022-10-11 NOTE — Progress Notes (Addendum)
Nutrition Follow-up  DOCUMENTATION CODES:   Severe malnutrition in context of chronic illness  INTERVENTION:  - Plan to trial continuous tube feeds as assess tolerance. As concerned patient will have reflux at night while laying down, will plan to run tube feeds during the day. Initiate tube feeding via PEG: Osmolite 1.5 at 105 ml/h x14 hours (0600-2000) *Start at 47m/hr and advance by 175mevery 2 hours to goal as tolerated Prosource TF20 60 ml daily Provides 2285 kcal, 112 gm protein, 1120 ml free water daily  - 5033mree water flushes Q4H as able. Total free water with tube feed and free water flushes = 1420m19my.             - Monitor sodium. 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 - Continue home Pedialyte per tube as medically appropriate. - Monitor bowel movements.  NUTRITION DIAGNOSIS:   Severe Malnutrition related to chronic illness (oropharyngeal cancer with inability to eat PO s/p PEG) as evidenced by severe fat depletion, severe muscle depletion. *ongoing  GOAL:   Patient will meet greater than or equal to 90% of their needs *not being met with TF due to intolerance issues  MONITOR:   Weight trends, TF tolerance  REASON FOR ASSESSMENT:   Consult Enteral/tube feeding initiation and management  ASSESSMENT:   70 y85. 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  History of multiple episodes of aspiration pneumonia who was brought to the emergency due to hypoxia after his wife found him in the bathroom "gagging and short of breath". He has also had increased GI upset due to increased phlegm from PEG tube.  The patient stated that his feeding tube got dislodged and was reinserted at home. He saw MD that same day to check for PEG positioning.  The tube has been  working without issue since then.   On admission, patient was found to have evidence of aspiration pneumonia with a right lung field infiltrate.  Empiric Unasyn started on admission.  Patient with physiology signs consistent with sepsis.  Patient continues to have TF tolerance issues. Wife reports they have been able to give small boluses (3-4oz) every 4-5 hours but wife knows this will not meet patients calorie and protein needs. Main issues seem to be continued reflux as well as volume intolerance. Patient also continues to experience diarrhea (controlled with Imodium). Wife notes patient tolerated tube feeds for over a year up until August of this year when reflux issues began. She feels Osmolite 1.5 is better tolerated than Jevity 1.5 but worries she will not be able to get it after discharge due to national shortages of a variety of tube feed formulas.   Patient discussed with MD and wife this morning. Per attending Dr. DanfLoleta Booksan to get patient's GI physician Dr. HungBenson Norwayolved in care to assess options.  For now, this RD discussed trailing continuous Tfs to aid in decreased reflux symptoms and aiding in meeting calorie and protein needs so as not to get too far behind on patients nutritional status. Will trial Osmolite 1.5 as it is lower in fat and lower in fiber which should aid in faster digestion as well as decreased reflux. Wife also feels this formula is better tolerated.  Wife and MD on board for plan. Will continue to monitor tolerance closely. Discussed plan with RN - starting at 40mL52mand  increasing slowly as pt tolerates to goal. To be restarted at last rate tolerated each morning.    Diet Order:   Diet Order             Diet NPO time specified  Diet effective now                   EDUCATION NEEDS:  No education needs have been identified at this time  Skin:  Skin Assessment: Reviewed RN Assessment  Last BM:  11/19  Height:  Ht Readings from Last 1 Encounters:   10/06/22 6' (1.829 m)   Weight:  Wt Readings from Last 1 Encounters:  10/06/22 74.4 kg    BMI:  Body mass index is 22.24 kg/m.  Estimated Nutritional Needs:  Kcal:  2200-2450 kcal Protein:  110-135 grams Fluid:  >/= 2.2L   Samson Frederic RD, LDN For contact information, refer to [facility].

## 2022-10-11 NOTE — Consult Note (Signed)
Reason for Consult: Aspiration pneumonia Referring Physician: [facility]  Teena Irani HPI: This is a 71 year old male with a PMH of oropharyngeal cancer s/p XRT, s/p PEG tube, and recurrent aspiration pneumonia.  Last Saturday he woke up at 1-2 AM with some diarrhea.  Later this was followed by a "gagging" event and SOB.  His wife was concerned and EMS was summoned.  She recalls that EMS reported his pulse ox in the 70% range.  Upon arrival at the ER his BP was 85/51, HR 109, and T 101.12F.  The CXR showed a chronic right hemidiaphragm elevation and a right sided infiltrate.  He was felt to have another episode of aspiration pneumonia.  Prior to this time he had three other events of aspiration pneumonia and all the episodes occurred in the early hours of the morning.  There was the thought that he was experiencing reflux of his tube feedings.  His last feeding was at 8 PM and earlier in the summer, after his third episode of aspiration pneumonia, the decision was made to disperse the 8 PM bolus tube feeding to the other 3 boluses.  The new regimen would have his last bolus feeding at 4 PM.  Also, he was to stay upright as much as possible by avoiding excessive naps during the day.  This appeared to be working for him and he was still able to maintain his weight.  He reports that he has a significant amount of oral secretions that are thick.  His wife feels that he is having more secretions this year.  Also, she notices that he does cough more during the daytime hours.  His last MBS was 10/2021 and he failed the test.  He was to remain on his tube feedings.  Past Medical History:  Diagnosis Date   Allergy to environmental factors    Basal cell carcinoma of left cheek 9/79/8921   Complication of anesthesia    "vageled" after surgery -overnite stay   Diverticulosis 2008   Heart murmur    hx of in childhood    History of chemotherapy    History of radiation therapy 11/05/10-12/26/10   r base  tongue, 7000 cGy 35 sessions   Hypertension    Medication started in May 2016.   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   Tinnitus     Past Surgical History:  Procedure Laterality Date   APPENDECTOMY     COLONOSCOPY     INGUINAL HERNIA REPAIR Bilateral 03/30/2015   Procedure: LAPAROSCOPIC BILATERAL INGUINAL HERNIA REPAIR WITH MESH;  Surgeon: Coralie Keens, MD;  Location: [facility];  Service: General;  Laterality: Bilateral;   INGUINAL HERNIA REPAIR Right 11/18/1958   INGUINAL HERNIA REPAIR Bilateral 03/30/2015   INGUINAL HERNIA REPAIR Left 07/11/2015   INGUINAL HERNIA REPAIR N/A 07/11/2015   Procedure: REPAIR OF LEFT INGUINAL HERNIA WITH MESH;  Surgeon: Coralie Keens, MD;  Location: [place];  Service: General;  Laterality: N/A;   INSERTION OF MESH N/A 07/11/2015   Procedure: INSERTION OF MESH;  Surgeon: Coralie Keens, MD;  Location: [place];  Service: General;  Laterality: N/A;   IR [place] W/FLUORO  12/03/2021   LUMBAR [place] SURGERY     PEG PLACEMENT N/A 03/22/2022   Procedure: PERCUTANEOUS ENDOSCOPIC GASTROSTOMY (PEG) REPLACEMENT;  Surgeon: Carol Ada, MD;  Location: [facility];  Service: Gastroenterology;  Laterality:  N/A;   SAVORY DILATION N/A 03/22/2022   Procedure: SAVORY DILATION;  Surgeon: Carol Ada, MD;  Location: [facility];  Service: Gastroenterology;  Laterality: N/A;   SKIN BIOPSY Left 06/24/2022   basal cell carcinoma face cheek   TONSILLECTOMY      Family History  Problem Relation Age of Onset   Heart disease Father    Cancer Father        throat ca   Heart disease Mother    Colon cancer Neg Hx     Social History:  reports that he has never smoked. He has never used smokeless tobacco. He reports current alcohol use. He reports that he does not use drugs.  Allergies:  Allergies  Allergen Reactions   Codeine Nausea And Vomiting    Medications:  Scheduled:  albuterol  2.5 mg Nebulization TID   enoxaparin (LOVENOX) injection  40 mg Subcutaneous Q24H   [START ON 10/12/2022] feeding supplement (OSMOLITE 1.2 CAL)  1,470 mL Per Tube Q24H   feeding supplement (PROSource TF20)  60 mL Per Tube Daily   fluticasone  2 spray Each Nare QHS   guaiFENesin  15 mL Per Tube QID   hydrALAZINE  10 mg Per Tube Q6H   mouth rinse  15 mL Mouth Rinse 4 times per day   pantoprazole (PROTONIX) IV  40 mg Intravenous Daily   Pedialyte  355 mL Per Tube Daily   Continuous:  Results for orders placed or performed during the hospital encounter of 10/06/22 (from the past 24 hour(s))  CBC     Status: Abnormal   Collection Time: 10/11/22  3:51 AM  Result Value Ref Range   WBC 6.5 4.0 - 10.5 K/uL   RBC 3.48 (L) 4.22 - 5.81 MIL/uL   Hemoglobin 10.1 (L) 13.0 - 17.0 g/dL   HCT 31.5 (L) 39.0 - 52.0 %   MCV 90.5 80.0 - 100.0 fL   MCH 29.0 26.0 - 34.0 pg   MCHC 32.1 30.0 - 36.0 g/dL   RDW 14.4 11.5 - 15.5 %   Platelets 153 150 - 400 K/uL   nRBC 0.0 0.0 - 0.2 %  Basic metabolic panel     Status: Abnormal   Collection Time: 10/11/22  3:51 AM  Result Value Ref Range   Sodium 132 (L) 135 - 145 mmol/L   Potassium 3.7 3.5 - 5.1 mmol/L   Chloride 97 (L) 98 - 111 mmol/L   CO2 25 22 - 32 mmol/L   Glucose, Bld 92 70 - 99 mg/dL   BUN 20 8 - 23 mg/dL   Creatinine, Ser 0.74 0.61 - 1.24 mg/dL   Calcium 8.3 (L) 8.9 - 10.3 mg/dL   GFR, Estimated >60 >60 mL/min   Anion gap 10 5 - 15     DG Chest 2 View  Result Date: 10/10/2022 CLINICAL DATA:  Acid reflux. EXAM: CHEST - 2 VIEW COMPARISON:  10/06/2022 FINDINGS: Stable asymmetric elevation of the right hemidiaphragm with diffuse patchy airspace disease in the right lung. Left lung clear. The cardio pericardial silhouette is enlarged. The visualized bony structures of the thorax are unremarkable. IMPRESSION: Asymmetric elevation of the right hemidiaphragm with diffuse patchy airspace disease in the right lung. The dense  parahilar consolidative opacity seen in the right lung previously has decreased in the interval. Imaging features remain suspicious for pneumonia. Electronically Signed   By: Misty Stanley M.D.   On: 10/10/2022 09:34    ROS:  As stated above in the HPI otherwise negative.  Blood pressure (!) 185/104, pulse 96, temperature 98.4 F (36.9 C), temperature source Oral, resp. rate 18, height 6' (1.829 m), weight 74.4 kg, SpO2 90 %.    PE: Gen: NAD, Alert and Oriented HEENT:  [place]/AT, EOMI Neck: Supple, no LAD Lungs: CTA Bilaterally CV: RRR without M/G/R ABD: Soft, NTND, +BS Ext: No C/C/E  Assessment/Plan: 1) Aspiration pneumonia. 2) Sinus and oral secretions. 3) Chronic dysphagia requiring a PEG tube. 4) Diarrhea.   Clinically the patient is progressing, but he is struggling with cough, secretions, and diarrhea.  From the history obtained with this last aspiration event, it appears that he is aspirating from his secretions.  The change in his tube feedings, and the volume in his tube feedings, passed through his stomach and was absorbed in his small bowel, many hours before his aspiration event.  Also, his prior aspiration events all had the same presentation, ie, early morning waking with gagging of his secretions.  He does have an ENT and he was recommended to see the ENT upon discharge.  The goal will be to see if there can be any medications to help minimize the secretions.  Additionally, he was recommended to have a bedside humidifier to help moisten his secretions as he feels that his secretions are thick.  Finally, for his secretions, he is to purchase a beside suctioning unit and to orally suction during his many frequent nocturnal wakings.  As for his diarrhea, he did have it at the outset, but he was not certain if this was from the change in his tube feedings.  Any changes with his tube feeding will cause a change in his bowel habits.  Also, he is receiving antibiotics.  It does not appear  to be infectious at this time and Imodium is helping.  If it continues stool studies are recommended.  Plan: 1) Continue with antibiotics. 2) Continue with tube feedings for now at the current rate. 3) ENT follow up upon discharge. 4) No GI intervention is recommended. 5) Follow diarrhea.  Chelisa Hennen D 10/11/2022, 5:53 PM

## 2022-10-11 NOTE — Progress Notes (Signed)
Patient was found ambulating with spouse with no AD prior to session. PTA student monitored RA 91-92% and HR with highest at 111 while ambulating. Will see patient another day as schedule permits.  Allyson Sabal, [place].

## 2022-10-11 NOTE — Progress Notes (Signed)
Pt was started on Osmolyte 1.5 40cc/hr as instructed by dietician to increase 10cc q 2hrs with the goal of 105cc/hr. Pt will only get the tube feeding for few hours which will stop at 8pm and will be started back from 6am-8pm tom onwards.

## 2022-10-12 DIAGNOSIS — E871 Hypo-osmolality and hyponatremia: Secondary | ICD-10-CM | POA: Diagnosis not present

## 2022-10-12 DIAGNOSIS — E43 Unspecified severe protein-calorie malnutrition: Secondary | ICD-10-CM | POA: Diagnosis not present

## 2022-10-12 DIAGNOSIS — J690 Pneumonitis due to inhalation of food and vomit: Secondary | ICD-10-CM | POA: Diagnosis not present

## 2022-10-12 DIAGNOSIS — G903 Multi-system degeneration of the autonomic nervous system: Secondary | ICD-10-CM | POA: Diagnosis not present

## 2022-10-12 DIAGNOSIS — R197 Diarrhea, unspecified: Secondary | ICD-10-CM

## 2022-10-12 LAB — COMPREHENSIVE METABOLIC PANEL
ALT: 25 U/L (ref 0–44)
AST: 24 U/L (ref 15–41)
Albumin: 2.8 g/dL — ABNORMAL LOW (ref 3.5–5.0)
Alkaline Phosphatase: 61 U/L (ref 38–126)
Anion gap: 10 (ref 5–15)
BUN: 22 mg/dL (ref 8–23)
CO2: 25 mmol/L (ref 22–32)
Calcium: 8.4 mg/dL — ABNORMAL LOW (ref 8.9–10.3)
Chloride: 98 mmol/L (ref 98–111)
Creatinine, Ser: 0.76 mg/dL (ref 0.61–1.24)
GFR, Estimated: >60 mL/min (ref >60)
Glucose, Bld: 85 mg/dL (ref 70–99)
Potassium: 3.7 mmol/L (ref 3.5–5.1)
Sodium: 133 mmol/L — ABNORMAL LOW (ref 135–145)
Total Bilirubin: 0.9 mg/dL (ref 0.3–1.2)
Total Protein: 6.4 g/dL — ABNORMAL LOW (ref 6.5–8.1)

## 2022-10-12 LAB — CBC
HCT: 32.9 % — ABNORMAL LOW (ref 39.0–52.0)
Hemoglobin: 10.5 g/dL — ABNORMAL LOW (ref 13.0–17.0)
MCH: 29 pg (ref 26.0–34.0)
MCHC: 31.9 g/dL (ref 30.0–36.0)
MCV: 90.9 fL (ref 80.0–100.0)
Platelets: 186 10*3/uL (ref 150–400)
RBC: 3.62 MIL/uL — ABNORMAL LOW (ref 4.22–5.81)
RDW: 14.1 % (ref 11.5–15.5)
WBC: 4.8 10*3/uL (ref 4.0–10.5)
nRBC: 0 % (ref 0.0–0.2)

## 2022-10-12 MED ORDER — SODIUM CHLORIDE 0.9 % IV SOLN
3.0000 g | Freq: Four times a day (QID) | INTRAVENOUS | Status: DC
  Administered 2022-10-12 – 2022-10-13 (×4): 3 g via INTRAVENOUS
  Filled 2022-10-12 (×5): qty 8

## 2022-10-12 NOTE — Social Work (Signed)
Per provider notes the patient will need continuous tube feeds when he goes home [facility] order is in.Equipment: Tube feed pump, tube feed supplies and tubing, and also suction if he could get a nebulizer that would also be good   TOC will continue to follow for patient to get the supplies he needs.

## 2022-10-12 NOTE — Social Work (Signed)
TOC will need to set up [facility]. Please see Providers Notes.

## 2022-10-12 NOTE — Progress Notes (Addendum)
Progress Note   Patient: Lee Barnes FAO:[id number] DOB: [birth year] DOA: 10/06/2022     6 DOS: the patient was seen and examined on 10/12/2022 at 12:35PM      Brief hospital course: Lee Barnes is a 71 y.o. M with history of oropharyngeal cancer status postradiation, PEG tube placement, repeated aspiration pneumonia, and chronic orthostasis and chronic hyponatremia who presented with dyspnea and fatigue.  In the ER he had sepsis and extensive right lung infiltrates.     11/19: Admitted on O2 11/21: Still fevering, aspirated in afternoon, tube feedings held 11/23: Fever resolved, resumed tube feeds but still with "tasting medicine in back of throat" with bolus feeds 11/24: GI consulted; transitioned in evening to continuous tube feeds 11/25: Tolerating continuous tube feeds well     Assessment and Plan: * Sepsis due to aspiration pneumonia Presented with fever to 101, tachycardia to 137, Blood pressure as low as 80/40 in the ER in the setting of an aspiration pneumonia.  Also found to have respiratory failure due to pneumonia.  Fever resolved.  Fever curve improved yesterday and O2 resolved to baseline today.  Given several ongoing aspiration events during treatment and slow recovery, will continue Unasyn 7 days - Continue Unasyn day 6 of 7 - Aggressive pulmonary toilet    History of oropharyngeal cancer Reflux Dysphagia Status post PEG tube Has had recurrent and difficult problems with reflux here. Patient had tolerated bolus feedings without issue for almost a yaer until this past August Lee Barnes relates that reflux started back in August and shortly after started to have episodic wheezing, gagging, and has had now 3 episodes of aspiration pneumonia. During this hospital stay, several times, after bolus tube feeds, the patient has had a sensation that he could taste tube feed or medicine (like cough medicine recently administered via PEG) in his mouth, or that he blew his  nose and tube feed liquid came out.   We tried taking a break.  We tried using smaller feeds, but nothing helped.  I consulted GI to evaluate whether this was related to a hiatal hernia, gastroparesis, or some other treatable cause of new reflux, but they could offer no new insights.  11/24 we started continuous tube feeds - Consult dietitian, continue tube feeds cautiously - TOC consult for tube feed pump, supplies and HH - Also will need suction at home - Also will need nebulizer for albuterol at home  - Continue PPI    Hypertensive urgency This is resolving without treatment. - Hold midodrine  Protein-calorie malnutrition, severe As evidenced by severe loss of subcutaneous muscle mass and fat, inability to fully tolerate his PEG tube feeds, and n.p.o. status for over 1 year   Acute respiratory failure with hypoxia (HCC) Presented with gagging and shortness of breath, tachypnea, arterial PO2 in the 40s requiring up to 5 L supplemental oxygen.  Weaning down now, but still on oxygen.  Normocytic anemia Hemoglobin has trended down to 10 in the setting of his critical illness  Chronic hyponatremia Sodium 133, stable, asymptomatic  Neurogenic orthostatic hypotension (HCC) Blood pressure extremely high here, has chronic orthostasis - Check orthostatics - Hold midodrine          Subjective: No complaints     Physical Exam: BP 108/70 (BP Location: Left Arm)   Pulse 84   Temp 99.6 F (37.6 C) (Oral)   Resp 14   Ht 6' (1.829 m)   Wt 74.4 kg   SpO2 94%   BMI 22.24  kg/m   Very thin adult male, sitting up in bed, no acute distress RRR, no murmurs, no peripheral edema Respiratory rate normal, lungs clear without rales or wheezes Abdomen soft no tenderness palpation or guarding   Data Reviewed: Basic metabolic panel and hemoglobin are unchanged  Family Communication: Lee Barnes at the bedside    Disposition: Status is: Inpatient The patient was admitted for  sepsis from aspiration pneumonia  From standpoint of his pneumonia he is essentially treated, will do 1 more day of antibiotics today and tomorrow  However he has had repeated aspirations, which GI have been unable to identify reversible causes  We will need to switch to continuous tube feeds.  Dietitian did this yesterday, TOC are working towards obtaining DME supplies for continuous tube feeds at home, as well as suctioning and nebulizer  Once the supplies are in place, and the patient's family are comfortable with the continuous tube feeds, he will likely discharge Sunday or Monday.        Author: Alberteen Sam, MD 10/12/2022 4:30 PM  For on call review [email or web address].

## 2022-10-12 NOTE — Progress Notes (Addendum)
Coverage for Drs Benson Norway and Collene Mares   Daily Progress Note  Hospital Day: 7  Chief Complaint: diarrhea  Brief History 71 y.o. male with a pmh not limited to oropharyngeal cancer status postradiation, PEG tube placement, repeated aspiration pneumonia, and chronic orthostasis and chronic hyponatremia   Assessment:   # 71 yo male with history of oropharyngeal cancer s/p radiation and PEG tube placement  # Chronic, intermittent diarrhea. Suspect non C-diff antibiotic related diarrhea.  Stool studies not obtained but infectious etiology not suspected.  Tends to get diarrhea anytime there are changes in enteral feeding regimen and /or antibiotic use for repeated bouts of aspiration PNA. WBC normal, abdomen not significantly tender on exam Diarrhea resolving. He is getting imodium which he takes at home as needed  # Sepsis due to aspiration pneumonia. Got last dose of Unasyn this am.   # Chronic hyponatremia.  Na+ stable at 133  # GERD. He was having reflux symptoms laying in bed. Feels better now up in chair.  Continue pantoprazole.   Plan:   Continue imodium as needed   Subjective  Diarrhea improving. Taking imodium. Was having reflux symptoms lying in bed   Objective  Imaging:  DG Chest 2 View  Result Date: 10/10/2022 CLINICAL DATA:  Acid reflux. EXAM: CHEST - 2 VIEW COMPARISON:  10/06/2022 FINDINGS: Stable asymmetric elevation of the right hemidiaphragm with diffuse patchy airspace disease in the right lung. Left lung clear. The cardio pericardial silhouette is enlarged. The visualized bony structures of the thorax are unremarkable. IMPRESSION: Asymmetric elevation of the right hemidiaphragm with diffuse patchy airspace disease in the right lung. The dense parahilar consolidative opacity seen in the right lung previously has decreased in the interval. Imaging features remain suspicious for pneumonia. Electronically Signed   By: Misty Stanley M.D.   On: 10/10/2022 09:34    Lab  Results: Recent Labs    10/10/22 0409 10/11/22 0351 10/12/22 0409  WBC 7.3 6.5 4.8  HGB 10.0* 10.1* 10.5*  HCT 30.5* 31.5* 32.9*  PLT 152 153 186   BMET Recent Labs    10/10/22 0409 10/11/22 0351 10/12/22 0409  NA 132* 132* 133*  K 3.8 3.7 3.7  CL 98 97* 98  CO2 '25 25 25  '$ GLUCOSE 84 92 85  BUN '19 20 22  '$ CREATININE 0.74 0.74 0.76  CALCIUM 8.3* 8.3* 8.4*   LFT Recent Labs    10/12/22 0409  PROT 6.4*  ALBUMIN 2.8*  AST 24  ALT 25  ALKPHOS 61  BILITOT 0.9   PT/INR No results for input(s): "LABPROT", "INR" in the last 72 hours.   Scheduled inpatient medications:   albuterol  2.5 mg Nebulization TID   enoxaparin (LOVENOX) injection  40 mg Subcutaneous Q24H   feeding supplement (OSMOLITE 1.2 CAL)  1,470 mL Per Tube Q24H   feeding supplement (PROSource TF20)  60 mL Per Tube Daily   fluticasone  2 spray Each Nare QHS   guaiFENesin  15 mL Per Tube QID   hydrALAZINE  10 mg Per Tube Q6H   mouth rinse  15 mL Mouth Rinse 4 times per day   pantoprazole (PROTONIX) IV  40 mg Intravenous Daily   Pedialyte  355 mL Per Tube Daily   Continuous inpatient infusions:  PRN inpatient medications: acetaminophen **OR** acetaminophen, albuterol, hydrALAZINE, hydrOXYzine, labetalol, loperamide HCl, ondansetron **OR** ondansetron (ZOFRAN) IV, mouth rinse, polyvinyl alcohol, prochlorperazine, sodium chloride  Vital signs in last 24 hours: Temp:  [98.4 F (36.9 C)-98.8 F (37.1 C)]  98.8 F (37.1 C) (11/25 0436) Pulse Rate:  [90-96] 92 (11/25 0436) Resp:  [18-20] 18 (11/25 0436) BP: (101-185)/(59-104) 132/88 (11/25 0436) SpO2:  [90 %-94 %] 94 % (11/25 0717) FiO2 (%):  [21 %] 21 % (11/24 1956) Last BM Date : 10/11/22  Intake/Output Summary (Last 24 hours) at 10/12/2022 1004 Last data filed at 10/12/2022 0600 Gross per 24 hour  Intake 0 ml  Output --  Net 0 ml    Intake/Output from previous day: No intake/output data recorded. Intake/Output this shift: No intake/output data  recorded.   Physical Exam:  General: Alert male in NAD Heart:  Regular rate and rhythm. No lower extremity edema Pulmonary: Normal respiratory effort Abdomen: Soft, nondistended, nontender. Normal bowel sounds.  Neurologic: Alert and oriented Psych: Pleasant. Cooperative.    Principal Problem:   Sepsis due to aspiration pneumonia Active Problems:   History of oropharyngeal cancer   Neurogenic orthostatic hypotension (HCC)   Chronic hyponatremia   Acute respiratory failure with hypoxia (HCC)   GERD (gastroesophageal reflux disease)   Protein-calorie malnutrition, severe   Hypertensive urgency   Normocytic anemia  Additional diagnosis is acute diarrhea   LOS: 6 days   Tye Savoy ,NP 10/12/2022, 10:04 AM   I have taken an interval history, thoroughly reviewed the chart and examined the patient. I agree with the Advanced Practitioner's note, impression and recommendations, and have recorded additional findings, impressions and recommendations below. I performed a substantive portion of this encounter (>50% time spent), including a complete performance of the medical decision making.  (NP and I saw this patient together)  My additional thoughts are as follows:  Stable diarrhea felt likely related to antibiotics plus minus change in tube feed formula.  Not behaving like infectious diarrhea.  If significantly worsens, obtain stool studies for C. difficile PCR.  Dr. Benson Norway will return to see patient on Monday, call our service sooner if urgent issues arise.   Nelida Meuse III Office:[phone]

## 2022-10-13 DIAGNOSIS — D649 Anemia, unspecified: Secondary | ICD-10-CM

## 2022-10-13 DIAGNOSIS — A419 Sepsis, unspecified organism: Principal | ICD-10-CM

## 2022-10-13 DIAGNOSIS — K219 Gastro-esophageal reflux disease without esophagitis: Secondary | ICD-10-CM | POA: Diagnosis not present

## 2022-10-13 DIAGNOSIS — E43 Unspecified severe protein-calorie malnutrition: Secondary | ICD-10-CM

## 2022-10-13 DIAGNOSIS — G903 Multi-system degeneration of the autonomic nervous system: Secondary | ICD-10-CM | POA: Diagnosis not present

## 2022-10-13 DIAGNOSIS — E871 Hypo-osmolality and hyponatremia: Secondary | ICD-10-CM | POA: Diagnosis not present

## 2022-10-13 DIAGNOSIS — J9601 Acute respiratory failure with hypoxia: Secondary | ICD-10-CM

## 2022-10-13 DIAGNOSIS — J690 Pneumonitis due to inhalation of food and vomit: Secondary | ICD-10-CM | POA: Diagnosis not present

## 2022-10-13 LAB — CBC
HCT: 38 % — ABNORMAL LOW (ref 39.0–52.0)
Hemoglobin: 12.1 g/dL — ABNORMAL LOW (ref 13.0–17.0)
MCH: 29 pg (ref 26.0–34.0)
MCHC: 31.8 g/dL (ref 30.0–36.0)
MCV: 91.1 fL (ref 80.0–100.0)
Platelets: 245 10*3/uL (ref 150–400)
RBC: 4.17 MIL/uL — ABNORMAL LOW (ref 4.22–5.81)
RDW: 14.4 % (ref 11.5–15.5)
WBC: 5.8 10*3/uL (ref 4.0–10.5)
nRBC: 0 % (ref 0.0–0.2)

## 2022-10-13 LAB — BASIC METABOLIC PANEL
Anion gap: 11 (ref 5–15)
BUN: 19 mg/dL (ref 8–23)
CO2: 27 mmol/L (ref 22–32)
Calcium: 8.8 mg/dL — ABNORMAL LOW (ref 8.9–10.3)
Chloride: 96 mmol/L — ABNORMAL LOW (ref 98–111)
Creatinine, Ser: 0.77 mg/dL (ref 0.61–1.24)
GFR, Estimated: >60 mL/min (ref >60)
Glucose, Bld: 94 mg/dL (ref 70–99)
Potassium: 4.2 mmol/L (ref 3.5–5.1)
Sodium: 134 mmol/L — ABNORMAL LOW (ref 135–145)

## 2022-10-13 NOTE — Progress Notes (Signed)
TOC CSW spoke with [place] from [place] [phone].  Pt was accept by [facility] for [facility] RN, Feeding Tube, and Suction.  CSW spoke with Mardene Celeste from [facility] [phone].  [facility] will be supplying feeding tube and nebulizer.  Fontaine Kossman Tarpley-Carter, MSW, LCSW-A Pronouns:  She/Her/Hers [facility] Health[facility] of Care Clinical Social Worker Direct Number:  [phone] [email or web address]

## 2022-10-13 NOTE — Progress Notes (Signed)
TOC CSW notified RNCM for this patient.  RNCM will follow up with dc planning tomorrow.  Delois Silvester Tarpley-Carter, MSW, LCSW-A Pronouns:  She/Her/Hers [facility] Care Clinical Social Worker Direct Number:  [phone] [email or web address]

## 2022-10-13 NOTE — Progress Notes (Signed)
PROGRESS NOTE    Lee Barnes  ZOX:[id number] DOB: 1951/01/15 DOA: 10/06/2022 PCP: Ronnald Nian, MD   Brief Narrative: Lee Barnes is a 71 y.o. 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  Patient presented secondary to fatigue and dyspnea after being found in the bathroom short of breath.  On admission, patient was found to have evidence of aspiration pneumonia with a right lung field infiltrate.  Empiric Unasyn started on admission.  Patient with physiology signs consistent with sepsis. Supplemental oxygen applied for acute hypoxia and has been weaned to room air. Bolus tube feeds contributing to reflux. GI and dietitian consulted. Patient transitioned to continuous tube feeds.   Assessment and Plan:  Sepsis Present on admission.  Secondary to aspiration pneumonia.  Blood cultures obtained on admission. Empiric Unasyn started for treatment of aspiration pneumonia. Blood cultures with no growth.  Aspiration pneumonia In setting of known dysphagia and PEG tube feeds. Imaging with right sided infiltrate consistent with infection. Empiric Unasyn initiated. -Continue Unasyn IV  Acute respiratory failure with hypoxia Secondary to aspiration pneumonia. Patient required non-rebreather on admission and weaned down to room air. -Ambulatory pulse oximetry prior to discharge  Oropharyngeal cancer Dysphagia Patient is status post PEG tube.  Unsure if tube feed regimen is contributing to aspiration episodes. GI and dietitian consulted. Patient transitioned to continuous tube feeds. Recommendations to start at 40 mL/hr with titration by 10 mL/hr every two hours to reach goal rate of 105 mL/hr. Patient has not been titrated past 60 mL/hr. -Titrate tube feeds as previously  ordered -Dietitian recommendations (11/24): Osmolite 1.5 at 105 ml/h x14 hours (0600-2000) Start at 60mL/hr and advance by 10mL every 2 hours to goal as tolerated Prosource TF20 60 ml daily Provides 2285 kcal, 112 gm protein, 1120 ml free water daily  50mL free water flushes Q4H as able. Total free water with tube feed and free water flushes = 1439mL/day.             - Monitor sodium. 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 Continue home Pedialyte per tube as medically appropriate. Monitor bowel movements.   Neurogenic orthostatic hypotension Noted. Supine blood pressure is running extremely high. -Discontinue midodrine for now  Severe malnutrition Tube feeds as mentioned above  Chronic hyponatremia Mild. asymptomatic  GERD -Continue Protonix   DVT prophylaxis: Lovenox Code Status:   Code Status: Full Code Family Communication: Wife at bedside Disposition Plan: Discharge home likely not for 2 days as patient has not been titrated to goal rate   Consultants:  Gastroenterology  Procedures:  None  Antimicrobials: Unasyn IV   Subjective: Breathing improved. Tolerating tube feeds.  Objective: BP (!) 147/82   Pulse 85   Temp 98.3 F (36.8 C) (Oral)   Resp 20   Ht 6' (1.829 m)   Wt 74.4 kg   SpO2 90%   BMI 22.24 kg/m   Examination:  General exam: Appears calm and comfortable Respiratory system: Clear to auscultation. Respiratory effort normal. Cardiovascular system: S1 & S2 heard, RRR. No murmurs, rubs, gallops or clicks. Gastrointestinal system: Abdomen is nondistended, soft and nontender. Normal bowel sounds heard. Central nervous system: Alert and oriented. No focal neurological deficits. Musculoskeletal: No edema. No calf tenderness Skin: No cyanosis. No rashes Psychiatry: Judgement and insight appear normal. Mood & affect appropriate.    Data Reviewed: I have  personally reviewed following labs and imaging studies  CBC Lab  Results  Component Value Date   WBC 5.8 10/13/2022   RBC 4.17 (L) 10/13/2022   HGB 12.1 (L) 10/13/2022   HCT 38.0 (L) 10/13/2022   MCV 91.1 10/13/2022   MCH 29.0 10/13/2022   PLT 245 10/13/2022   MCHC 31.8 10/13/2022   RDW 14.4 10/13/2022   LYMPHSABS 0.1 (L) 10/06/2022   MONOABS 0.1 10/06/2022   EOSABS 0.0 10/06/2022   BASOSABS 0.0 10/06/2022     Last metabolic panel Lab Results  Component Value Date   NA 134 (L) 10/13/2022   K 4.2 10/13/2022   CL 96 (L) 10/13/2022   CO2 27 10/13/2022   BUN 19 10/13/2022   CREATININE 0.77 10/13/2022   GLUCOSE 94 10/13/2022   GFRNONAA >60 10/13/2022   GFRAA 73 12/18/2020   CALCIUM 8.8 (L) 10/13/2022   PHOS 2.7 12/03/2021   PROT 6.4 (L) 10/12/2022   ALBUMIN 2.8 (L) 10/12/2022   LABGLOB 3.3 04/10/2022   AGRATIO 1.4 04/10/2022   BILITOT 0.9 10/12/2022   ALKPHOS 61 10/12/2022   AST 24 10/12/2022   ALT 25 10/12/2022   ANIONGAP 11 10/13/2022    GFR: Estimated Creatinine Clearance: 90.4 mL/min (by C-G formula based on SCr of 0.77 mg/dL).  Recent Results (from the past 240 hour(s))  Blood Culture (routine x 2)     Status: None   Collection Time: 10/06/22  6:25 AM   Specimen: BLOOD  Result Value Ref Range Status   Specimen Description   Final    BLOOD LEFT ANTECUBITAL Performed at [facility], [address] [place], [place] [address]    Special Requests   Final    BOTTLES DRAWN AEROBIC AND ANAEROBIC Blood Culture results may not be optimal due to an excessive volume of blood received in culture bottles Performed at [facility], [address] [place], [place] [address]    Culture   Final    NO GROWTH 5 DAYS Performed at [facility], [address], [place], [place] [address]    Report Status 10/11/2022 FINAL  Final  Resp panel by RT-PCR (RSV, Flu A&B, Covid) Anterior Nasal Swab     Status: None   Collection Time: 10/06/22  6:57 AM   Specimen: Anterior Nasal Swab  Result Value  Ref Range Status   SARS Coronavirus 2 by RT PCR NEGATIVE NEGATIVE Final    Comment: (NOTE) SARS-CoV-2 target nucleic acids are NOT DETECTED.  The SARS-CoV-2 RNA is generally detectable in upper respiratory specimens during the acute phase of infection. The lowest concentration of SARS-CoV-2 viral copies this assay can detect is 138 copies/mL. A negative result does not preclude SARS-Cov-2 infection and should not be used as the sole basis for treatment or other patient management decisions. A negative result may occur with  improper specimen collection/handling, submission of specimen other than nasopharyngeal swab, presence of viral mutation(s) within the areas targeted by this assay, and inadequate number of viral copies(<138 copies/mL). A negative result must be combined with clinical observations, patient history, and epidemiological information. The expected result is Negative.  Fact Sheet for Patients:  BloggerCourse.com  Fact Sheet for Healthcare Providers:  SeriousBroker.it  This test is no t yet approved or cleared by the [place] FDA and  has been authorized for detection and/or diagnosis of SARS-CoV-2 by FDA under an Emergency Use Authorization (EUA). This EUA will remain  in effect (meaning this test can be  used) for the duration of the COVID-19 declaration under Section 564(b)(1) of the Act, 21 U.S.C.section 360bbb-3(b)(1), unless the authorization is terminated  or revoked sooner.       Influenza A by PCR NEGATIVE NEGATIVE Final   Influenza B by PCR NEGATIVE NEGATIVE Final    Comment: (NOTE) The Xpert Xpress SARS-CoV-2/FLU/RSV plus assay is intended as an aid in the diagnosis of influenza from Nasopharyngeal swab specimens and should not be used as a sole basis for treatment. Nasal washings and aspirates are unacceptable for Xpert Xpress SARS-CoV-2/FLU/RSV testing.  Fact Sheet for  Patients: BloggerCourse.com  Fact Sheet for Healthcare Providers: SeriousBroker.it  This test is not yet approved or cleared by the [place] FDA and has been authorized for detection and/or diagnosis of SARS-CoV-2 by FDA under an Emergency Use Authorization (EUA). This EUA will remain in effect (meaning this test can be used) for the duration of the COVID-19 declaration under Section 564(b)(1) of the Act, 21 U.S.C. section 360bbb-3(b)(1), unless the authorization is terminated or revoked.     Resp Syncytial Virus by PCR NEGATIVE NEGATIVE Final    Comment: (NOTE) Fact Sheet for Patients: BloggerCourse.com  Fact Sheet for Healthcare Providers: SeriousBroker.it  This test is not yet approved or cleared by the [place] FDA and has been authorized for detection and/or diagnosis of SARS-CoV-2 by FDA under an Emergency Use Authorization (EUA). This EUA will remain in effect (meaning this test can be used) for the duration of the COVID-19 declaration under Section 564(b)(1) of the Act, 21 U.S.C. section 360bbb-3(b)(1), unless the authorization is terminated or revoked.  Performed at [facility], [address], [place], [place] [address]   Urine Culture     Status: Abnormal   Collection Time: 10/06/22  7:09 AM   Specimen: In/Out Cath Urine  Result Value Ref Range Status   Specimen Description   Final    IN/OUT CATH URINE Performed at [facility], [address], [place], [place] [address]    Special Requests   Final    NONE Performed at [facility], [address], [place], [place] [address]    Culture 5,000 COLONIES/mL STAPHYLOCOCCUS HAEMOLYTICUS (A)  Final   Report Status 10/08/2022 FINAL  Final   Organism ID, Bacteria STAPHYLOCOCCUS HAEMOLYTICUS (A)  Final      Susceptibility   Staphylococcus haemolyticus -  MIC*    CIPROFLOXACIN <=0.5 SENSITIVE Sensitive     GENTAMICIN <=0.5 SENSITIVE Sensitive     NITROFURANTOIN <=16 SENSITIVE Sensitive     OXACILLIN <=0.25 SENSITIVE Sensitive     TETRACYCLINE <=1 SENSITIVE Sensitive     VANCOMYCIN 2 SENSITIVE Sensitive     TRIMETH/SULFA <=10 SENSITIVE Sensitive     CLINDAMYCIN <=0.25 SENSITIVE Sensitive     RIFAMPIN <=0.5 SENSITIVE Sensitive     Inducible Clindamycin NEGATIVE Sensitive     * 5,000 COLONIES/mL STAPHYLOCOCCUS HAEMOLYTICUS  Blood Culture (routine x 2)     Status: None   Collection Time: 10/06/22  8:26 AM   Specimen: BLOOD  Result Value Ref Range Status   Specimen Description   Final    BLOOD BLOOD RIGHT HAND Performed at [facility], [address], [place], [place] [address]    Special Requests   Final    BOTTLES DRAWN AEROBIC AND ANAEROBIC Blood Culture adequate volume Performed at [facility], [address], [place], [place] [address]    Culture   Final    NO  GROWTH 5 DAYS Performed at [facility], [address], [place], [place] [address]    Report Status 10/11/2022 FINAL  Final      Radiology Studies: No results found.    LOS: 7 days    Jacquelin Hawking, MD [facility] 10/13/2022, 6:47 PM   If 7PM-7AM, please contact night-coverage [email or web address]

## 2022-10-13 NOTE — Plan of Care (Signed)

## 2022-10-13 NOTE — Progress Notes (Addendum)
TOC CSW spoke with pts daughter, Wells Guiles [phone] about doctors orders for home such as, feeding tube supplies, nebulizer, [facility] RN.  CSW advised pts daughter, Wells Guiles to discuss any further supplies needed for home with dr.  Erskine Speed, MSW, LCSW-A Pronouns:  She/Her/Hers [facility] HealthTransitions of Care Clinical Social Worker Direct Number:  [phone] [email or web address]

## 2022-10-14 DIAGNOSIS — G903 Multi-system degeneration of the autonomic nervous system: Secondary | ICD-10-CM | POA: Diagnosis not present

## 2022-10-14 DIAGNOSIS — E871 Hypo-osmolality and hyponatremia: Secondary | ICD-10-CM | POA: Diagnosis not present

## 2022-10-14 DIAGNOSIS — K219 Gastro-esophageal reflux disease without esophagitis: Secondary | ICD-10-CM | POA: Diagnosis not present

## 2022-10-14 DIAGNOSIS — J690 Pneumonitis due to inhalation of food and vomit: Secondary | ICD-10-CM | POA: Diagnosis not present

## 2022-10-14 MED ORDER — AMOXICILLIN-POT CLAVULANATE 400-57 MG/5ML PO SUSR
875.0000 mg | Freq: Two times a day (BID) | ORAL | Status: AC
  Administered 2022-10-14 (×2): 875 mg
  Filled 2022-10-14 (×2): qty 10.9

## 2022-10-14 MED ORDER — OMEPRAZOLE 20 MG PO CPDR
40.0000 mg | DELAYED_RELEASE_CAPSULE | Freq: Every day | ORAL | Status: DC
  Administered 2022-10-14 – 2022-10-15 (×2): 40 mg via ORAL
  Filled 2022-10-14 (×3): qty 2

## 2022-10-14 MED ORDER — OSMOLITE 1.5 CAL PO LIQD
1425.0000 mL | ORAL | Status: DC
  Administered 2022-10-14 – 2022-10-15 (×2): 1425 mL
  Filled 2022-10-14 (×2): qty 1659

## 2022-10-14 NOTE — Progress Notes (Signed)
Brief Nutrition Support Note  Patient tolerated continuous feeds through the weekend. Running at 5m/hr this AM. Per TOC case manager, rate must be below 923mhr to be covered. New TF regimen as below:  TF regimen (inpatient and once discharged): Osmolite 1.5 at 95 ml/h x15 hours (0600-2100) Prosource TF20 60 ml daily Provides 2218 kcal, 109 gm protein, 1086 ml free water daily 12542mree water flushes every 4 hours (as tolerated) = 1835m74my   TF regimen and instructions provided to patient and wife and added to discharge instructions. Addressed all questions and concerns from both patient and wife. Patient has desire to get back to bolus feeds in the future due to confining nature of continuous feeds that infuse all day. Will order outpatient dietitian referral for tube management and adjustments. Plan for patient to reach goal rate today and discharge tomorrow if tolerating tube feed.   AspeSamson Barnes LDN For contact information, refer to [facility]

## 2022-10-14 NOTE — Progress Notes (Signed)
PROGRESS NOTE    [name]  ZOX:[id number] DOB: [birth year] DOA: 10/06/2022 PCP: Ronnald Nian, MD   Brief Narrative: Lee Barnes is a 71 y.o. 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  Patient presented secondary to fatigue and dyspnea after being found in the bathroom short of breath.  On admission, patient was found to have evidence of aspiration pneumonia with a right lung field infiltrate.  Empiric Unasyn started on admission.  Patient with physiology signs consistent with sepsis. Supplemental oxygen applied for acute hypoxia and has been weaned to room air. Bolus tube feeds contributing to reflux. GI and dietitian consulted. Patient transitioned to continuous tube feeds.   Assessment and Plan:  Sepsis Present on admission.  Secondary to aspiration pneumonia.  Blood cultures obtained on admission. Empiric Unasyn started for treatment of aspiration pneumonia. Blood cultures with no growth.  Aspiration pneumonia In setting of known dysphagia and PEG tube feeds. Imaging with right sided infiltrate consistent with infection. Empiric Unasyn initiated. IV lost. -Augmentin to complete treatment.  Acute respiratory failure with hypoxia Secondary to aspiration pneumonia. Patient required non-rebreather on admission and weaned down to room air.  Oropharyngeal cancer Dysphagia Patient is status post PEG tube.  Unsure if tube feed regimen is contributing to aspiration episodes. GI and dietitian consulted. Patient transitioned to continuous tube feeds. Initial recommendation to start at 40 mL/hr with titration by 10 mL/hr every two hours to reach goal rate of 105 mL/hr. New goal rate of 95 mL/hr. -Dietitian recommendations (11/27): Osmolite 1.5 at 95 ml/h x15 hours  (0600-2100) Prosource TF20 60 ml daily Provides 2218 kcal, 109 gm protein, 1086 ml free water daily free water flushes every 4 hours (as tolerated) = 1836mL/day   Neurogenic orthostatic hypotension Noted. Supine blood pressure is running extremely high. -Continue to hold midodrine for now  Severe malnutrition Tube feeds as mentioned above  Chronic hyponatremia Mild. asymptomatic  GERD -Continue Protonix   DVT prophylaxis: Lovenox Code Status:   Code Status: Full Code Family Communication: Wife at bedside Disposition Plan: Discharge home tomorrow if stable on current tube feeds   Consultants:  Gastroenterology  Procedures:  None  Antimicrobials: Unasyn IV   Subjective: No issues overnight. Slept well.  Objective: BP (!) 169/93 (BP Location: Left Arm)   Pulse 80   Temp 98.4 F (36.9 C) (Oral)   Resp 18   Ht 6' (1.829 m)   Wt 74.4 kg   SpO2 93%   BMI 22.24 kg/m   Examination:  General exam: Appears calm and comfortable Respiratory system: Clear to auscultation. Respiratory effort normal. Cardiovascular system: S1 & S2 heard, RRR. Gastrointestinal system: Abdomen is nondistended, soft and nontender. Normal bowel sounds heard. Central nervous system: Alert and oriented. No focal neurological deficits. Musculoskeletal: No edema. No calf tenderness Skin: No cyanosis. No rashes Psychiatry: Judgement and insight appear normal. Mood & affect appropriate.    Data Reviewed: I have personally reviewed following labs and imaging studies  CBC Lab Results  Component Value Date   WBC 5.8 10/13/2022   RBC 4.17 (L) 10/13/2022   HGB 12.1 (L) 10/13/2022   HCT 38.0 (L) 10/13/2022   MCV 91.1 10/13/2022   MCH 29.0 10/13/2022   PLT 245 10/13/2022   MCHC 31.8 10/13/2022   RDW 14.4 10/13/2022   LYMPHSABS 0.1 (L) 10/06/2022   MONOABS 0.1 10/06/2022  EOSABS 0.0 10/06/2022   BASOSABS 0.0 10/06/2022     Last metabolic panel Lab Results  Component Value  Date   NA 134 (L) 10/13/2022   K 4.2 10/13/2022   CL 96 (L) 10/13/2022   CO2 27 10/13/2022   BUN 19 10/13/2022   CREATININE 0.77 10/13/2022   GLUCOSE 94 10/13/2022   GFRNONAA >60 10/13/2022   GFRAA 73 12/18/2020   CALCIUM 8.8 (L) 10/13/2022   PHOS 2.7 12/03/2021   PROT 6.4 (L) 10/12/2022   ALBUMIN 2.8 (L) 10/12/2022   LABGLOB 3.3 04/10/2022   AGRATIO 1.4 04/10/2022   BILITOT 0.9 10/12/2022   ALKPHOS 61 10/12/2022   AST 24 10/12/2022   ALT 25 10/12/2022   ANIONGAP 11 10/13/2022    GFR: Estimated Creatinine Clearance: 90.4 mL/min (by C-G formula based on SCr of 0.77 mg/dL).  Recent Results (from the past 240 hour(s))  Blood Culture (routine x 2)     Status: None   Collection Time: 10/06/22  6:25 AM   Specimen: BLOOD  Result Value Ref Range Status   Specimen Description   Final    BLOOD LEFT ANTECUBITAL Performed at [facility], [address] [place], [place] [address]    Special Requests   Final    BOTTLES DRAWN AEROBIC AND ANAEROBIC Blood Culture results may not be optimal due to an excessive volume of blood received in culture bottles Performed at [facility], [address], [place], [place] [address]    Culture   Final    NO GROWTH 5 DAYS Performed at [facility], [address], [place], [place] [address]    Report Status 10/11/2022 FINAL  Final  Resp panel by RT-PCR (RSV, Flu A&B, Covid) Anterior Nasal Swab     Status: None   Collection Time: 10/06/22  6:57 AM   Specimen: Anterior Nasal Swab  Result Value Ref Range Status   SARS Coronavirus 2 by RT PCR NEGATIVE NEGATIVE Final    Comment: (NOTE) SARS-CoV-2 target nucleic acids are NOT DETECTED.  The SARS-CoV-2 RNA is generally detectable in upper respiratory specimens during the acute phase of infection. The lowest concentration of SARS-CoV-2 viral copies this assay can detect is 138 copies/mL. A negative result does not preclude SARS-Cov-2 infection and  should not be used as the sole basis for treatment or other patient management decisions. A negative result may occur with  improper specimen collection/handling, submission of specimen other than nasopharyngeal swab, presence of viral mutation(s) within the areas targeted by this assay, and inadequate number of viral copies(<138 copies/mL). A negative result must be combined with clinical observations, patient history, and epidemiological information. The expected result is Negative.  Fact Sheet for Patients:  BloggerCourse.com  Fact Sheet for Healthcare Providers:  SeriousBroker.it  This test is no t yet approved or cleared by the [place] FDA and  has been authorized for detection and/or diagnosis of SARS-CoV-2 by FDA under an Emergency Use Authorization (EUA). This EUA will remain  in effect (meaning this test can be used) for the duration of the COVID-19 declaration under Section 564(b)(1) of the Act, 21 U.S.C.section 360bbb-3(b)(1), unless the authorization is terminated  or revoked sooner.       Influenza A by PCR NEGATIVE NEGATIVE Final   Influenza B by PCR NEGATIVE NEGATIVE Final    Comment: (NOTE) The Xpert Xpress SARS-CoV-2/FLU/RSV plus assay is intended as an aid in the diagnosis of influenza from Nasopharyngeal swab specimens and should not  be used as a sole basis for treatment. Nasal washings and aspirates are unacceptable for Xpert Xpress SARS-CoV-2/FLU/RSV testing.  Fact Sheet for Patients: BloggerCourse.com  Fact Sheet for Healthcare Providers: SeriousBroker.it  This test is not yet approved or cleared by the [place] FDA and has been authorized for detection and/or diagnosis of SARS-CoV-2 by FDA under an Emergency Use Authorization (EUA). This EUA will remain in effect (meaning this test can be used) for the duration of the COVID-19 declaration  under Section 564(b)(1) of the Act, 21 U.S.C. section 360bbb-3(b)(1), unless the authorization is terminated or revoked.     Resp Syncytial Virus by PCR NEGATIVE NEGATIVE Final    Comment: (NOTE) Fact Sheet for Patients: BloggerCourse.com  Fact Sheet for Healthcare Providers: SeriousBroker.it  This test is not yet approved or cleared by the [place] FDA and has been authorized for detection and/or diagnosis of SARS-CoV-2 by FDA under an Emergency Use Authorization (EUA). This EUA will remain in effect (meaning this test can be used) for the duration of the COVID-19 declaration under Section 564(b)(1) of the Act, 21 U.S.C. section 360bbb-3(b)(1), unless the authorization is terminated or revoked.  Performed at [facility], [address] [place], [place] [address]   Urine Culture     Status: Abnormal   Collection Time: 10/06/22  7:09 AM   Specimen: In/Out Cath Urine  Result Value Ref Range Status   Specimen Description   Final    IN/OUT CATH URINE Performed at [facility], [address] [place], [place] [address]    Special Requests   Final    NONE Performed at [facility], [address], [place], [place] [address]    Culture 5,000 COLONIES/mL STAPHYLOCOCCUS HAEMOLYTICUS (A)  Final   Report Status 10/08/2022 FINAL  Final   Organism ID, Bacteria STAPHYLOCOCCUS HAEMOLYTICUS (A)  Final      Susceptibility   Staphylococcus haemolyticus - MIC*    CIPROFLOXACIN <=0.5 SENSITIVE Sensitive     GENTAMICIN <=0.5 SENSITIVE Sensitive     NITROFURANTOIN <=16 SENSITIVE Sensitive     OXACILLIN <=0.25 SENSITIVE Sensitive     TETRACYCLINE <=1 SENSITIVE Sensitive     VANCOMYCIN 2 SENSITIVE Sensitive     TRIMETH/SULFA <=10 SENSITIVE Sensitive     CLINDAMYCIN <=0.25 SENSITIVE Sensitive     RIFAMPIN <=0.5 SENSITIVE Sensitive     Inducible Clindamycin NEGATIVE Sensitive      * 5,000 COLONIES/mL STAPHYLOCOCCUS HAEMOLYTICUS  Blood Culture (routine x 2)     Status: None   Collection Time: 10/06/22  8:26 AM   Specimen: BLOOD  Result Value Ref Range Status   Specimen Description   Final    BLOOD BLOOD RIGHT HAND Performed at [facility], [address] [place], [place] [address]    Special Requests   Final    BOTTLES DRAWN AEROBIC AND ANAEROBIC Blood Culture adequate volume Performed at [facility], [address] [place], [place] [address]    Culture   Final    NO GROWTH 5 DAYS Performed at [facility], [address], [place], [place] [address]    Report Status 10/11/2022 FINAL  Final      Radiology Studies: No results found.    LOS: 8 days    Jacquelin Hawking, MD [facility] 10/14/2022, 6:19 PM   If 7PM-7AM, please contact night-coverage [email or web address]

## 2022-10-14 NOTE — Progress Notes (Signed)
Physical Therapy Discharge Patient Details Name: Lee Barnes MRN: [id number] DOB: 12/12/1950 Today's Date: 10/14/2022 Time: 0930-0942 PT Time Calculation (min) (ACUTE ONLY): 12 min  Patient discharged from PT services secondary to goals met and no further PT needs identified. Agree with  LPTA progress note dated 10/14/22  Please see latest therapy progress note for current level of functioning and progress toward goals.    Progress and discharge plan discussed with patient and/or caregiver: Patient/Caregiver agrees with plan  GP     PT Acute Rehabilitation Services Office [phone] Weekend pager-[phone]   ,  Lee Barnes 10/14/2022, 1:59 PM  

## 2022-10-14 NOTE — Progress Notes (Signed)
Physical Therapy Treatment Patient Details Name: Lee Barnes MRN: [id number] DOB: 1951-06-23 Today's Date: 10/14/2022   History of Present Illness Pt is a 71 y/o male admitted for aspiration PNA. PMH: basal cell carcinoma of L cheek, diverticulosis, childhood heart murmur, HTN, oropharyngeal cancer, tinnitis, hx of chemo and radiation, PEG tube placement.    PT Comments    Patient has met maximum lvl of mobility for acute care setting. Pt found in recliner prior to session. Denied any pain. Patient was independent with transfers; Required supervision for safety to ambulate 220 ft. Generally steady, able to maintain a safe gait speed throughout ambulation; SpO2 generally 92-93% during activity. Required min guard/supervision for stairs 3x2; min guard needed for first set and supervision needed for 2nd set. Cues needed to grab on R rail for safety. Patient plans on returning home with wife support.   Recommendations for follow up therapy are one component of a multi-disciplinary discharge planning process, led by the attending physician.  Recommendations may be updated based on patient status, additional functional criteria and insurance authorization.  Follow Up Recommendations  No PT follow up     Assistance Recommended at Discharge PRN  Patient can return home with the following A little help with bathing/dressing/bathroom;A little help with walking and/or transfers;Help with stairs or ramp for entrance   Equipment Recommendations  None recommended by PT    Recommendations for Other Services       Precautions / Restrictions Precautions Precautions: Fall Precaution Comments: monitor O2, PEG tube Restrictions Weight Bearing Restrictions: No     Mobility  Bed Mobility               General bed mobility comments: Patient found in recliner prior to session.    Transfers Overall transfer level: Independent Equipment used: None                     Ambulation/Gait Ambulation/Gait assistance: Supervision Gait Distance (Feet): 220 Feet Assistive device: None Gait Pattern/deviations: Step-through pattern       General Gait Details: generally steady, able to maintain a safe gait speed throughout ambulation. SpO2 generally 92-93% during activity.   Stairs Stairs: Yes Stairs assistance: Supervision, Min guard Stair Management: One rail Right Number of Stairs: 6 General stair comments: Stairs 3x2; min guard needed for first set and supervision needed for 2nd set.   Wheelchair Mobility    Modified Rankin (Stroke Patients Only)       Balance                                            Cognition Arousal/Alertness: Awake/alert Behavior During Therapy: WFL for tasks assessed/performed Overall Cognitive Status: Within Functional Limits for tasks assessed                                          Exercises      General Comments        Pertinent Vitals/Pain Pain Assessment Pain Assessment: No/denies pain    Home Living                          Prior Function            PT Goals (current goals can  now be found in the care plan section) Acute Rehab PT Goals Patient Stated Goal: home soon PT Goal Formulation: With patient Time For Goal Achievement: 10/17/22 Potential to Achieve Goals: Good Additional Goals Additional Goal #1: DGI score on >21 to correspond to low fall risk. Progress towards PT goals: Progressing toward goals    Frequency    Other (Comment)      PT Plan      Co-evaluation              AM-PAC PT "6 Clicks" Mobility   Outcome Measure  Help needed turning from your back to your side while in a flat bed without using bedrails?: None Help needed moving from lying on your back to sitting on the side of a flat bed without using bedrails?: None Help needed moving to and from a bed to a chair (including a wheelchair)?: A Little Help  needed standing up from a chair using your arms (e.g., wheelchair or bedside chair)?: None Help needed to walk in hospital room?: A Little Help needed climbing 3-5 steps with a railing? : A Little 6 Click Score: 21    End of Session Equipment Utilized During Treatment: Gait belt Activity Tolerance: Patient tolerated treatment well Patient left: with call bell/phone within reach;with family/visitor present;in chair;with chair alarm set Nurse Communication: Mobility status PT Visit Diagnosis: Difficulty in walking, not elsewhere classified (R26.2)     Time: 4098-1191 PT Time Calculation (min) (ACUTE ONLY): 12 min  Charges:  $Gait Training: 8-22 mins                       Brezlyn Manrique 10/14/2022, 1:18 PM

## 2022-10-14 NOTE — Discharge Instructions (Addendum)
Home Tube Feed Regimen - Continuous  Formula Name: Osmolite 1.5 Rate: 55m/hr Run time: 15 hours - from 6:00am to 9:00pm Goal Tube Feed regimen provides: 2218 calories, 109 grams of protein, and 10873mof free water from formula Free water flushes: as tolerated, can consider 12549mvery 4 hours to provides a total of 1836m63my   Setting Up Pump Feeding: Wash your hands thoroughly with soap and water. 2. Wipe the top of the formula container with clean, wet paper towel. 3. Mix the formula well by shaking or mixing as directed. 3. Pour formula into feeding container and close cap. To reduce the risk of bacterial contamination, fill your feeding container with just enough formula for up to 8 hours.  4. Hang the feeding container on the pole above the feeding pump or place securely inside the backpack. 5. Since every pump is different, follow the instructions provided by your healthcare professional to set up and operate your pump.  Starting the Pump Feeding: To administer the feeding, sit or lie with your head elevated at least 45 degrees and remain in this position for 30 to 60 minutes after the feeding. 2. Prime your feeding pump and set flow rate on pump as directed to the recommended mL per hour. 3. Open (uncap) your feeding tube or feeding tube extension. 4. Before starting the feeding, use the syringe to flush your feeding tube with 30 mL of lukewarm water or the amount directed by your healthcare professional. 5. Connect the end of the pump set tubing to your feeding tube or feeding tube extension. 6. If applicable, open the clamp or roller clamp on the pump set tubing. 7. Start your pump to begin infusing the formula. 8. If additional formula is needed for hang times >8 hours, rinse out the feeding bag and tubing with lukewarm water before adding fresh formula. Refill your feeding bag again with just enough formula for up to 8 hours. 9. After the feeding, disconnect the pump set  tubing from your feeding tube or feeding tube extension. Recap the end of the tubing with the plastic cap. 10. Using the syringe, flush your feeding tube again with 30 mL of lukewarm water or the amount directed by your healthcare professional. 11. Close (recap) your feeding tube or feeding tube extension. 12. Don't forget to rinse before refilling your feeding container with formula (at least every 8 hours). 13. Replace your feeding bag, tubing and syringes as directed by your home care supplier.

## 2022-10-14 NOTE — Care Management Important Message (Signed)
Important Message  Patient Details IM Letter given. Name: Lee Barnes MRN: [id number] Date of Birth: Dec 11, 1950   Medicare Important Message Given:  Yes     Kerin Salen 10/14/2022, 11:46 AM

## 2022-10-14 NOTE — TOC Progression Note (Addendum)
Transition of Care [facility]) - Progression Note    Patient Details  Name: Lee Barnes MRN: [id number] Date of Birth: [birth year]  Transition of Care [facility]) CM/SW Contact  [name], RN Phone Number: 10/14/2022, 9:16 AM  Clinical Narrative:    Suction machine and nebulizer ordered. Hhc oreered through [employer]. Tube feedings are also through [employer].  Flow rate this day is at 166m/hr for medicare to cover must be 941mhr or lower.  Message sent to Dr. NeLonny PrudeTube feeding rate is at 85cc/hr, tolerating .  May be dcd later this evening.  Will follow for dc needs.  Equipment and hhc are in place. Expected Discharge Plan: Home/Self Care Barriers to Discharge: Continued Medical Work up  Expected Discharge Plan and Services Expected Discharge Plan: Home/Self Care   Discharge Planning Services: CM Consult   Living arrangements for the past 2 months: Single Family Home                                       Social Determinants of Health (SDOH) Interventions    Readmission Risk Interventions   No data to display

## 2022-10-15 DIAGNOSIS — E43 Unspecified severe protein-calorie malnutrition: Secondary | ICD-10-CM | POA: Diagnosis not present

## 2022-10-15 DIAGNOSIS — A419 Sepsis, unspecified organism: Secondary | ICD-10-CM | POA: Diagnosis not present

## 2022-10-15 DIAGNOSIS — J9601 Acute respiratory failure with hypoxia: Secondary | ICD-10-CM | POA: Diagnosis not present

## 2022-10-15 MED ORDER — PROSOURCE TF20 ENFIT COMPATIBL EN LIQD: 60.0000 mL | Freq: Every day | ENTERAL | Status: DC

## 2022-10-15 MED ORDER — PROSOURCE TF20 ENFIT COMPATIBL EN LIQD: 60.0000 mL | Freq: Every day | ENTERAL | 0 refills | Status: AC

## 2022-10-15 MED ORDER — ALBUTEROL SULFATE (2.5 MG/3ML) 0.083% IN NEBU: 2.5000 mg | INHALATION_SOLUTION | RESPIRATORY_TRACT | 2 refills | Status: DC | PRN

## 2022-10-15 MED ORDER — OSMOLITE 1.5 CAL PO LIQD: 1425.0000 mL | ORAL | Status: AC

## 2022-10-15 MED ORDER — GUAIFENESIN 100 MG/5ML PO LIQD: 15.0000 mL | Freq: Four times a day (QID) | ORAL | 0 refills | Status: DC | PRN

## 2022-10-15 NOTE — TOC Progression Note (Signed)
Transition of Care [facility]) - Progression Note    Patient Details  Name: Lee Barnes MRN: [id number] Date of Birth: [birth year]  Transition of Care [facility]) CM/SW Contact  [name], RN Phone Number: 10/15/2022, 3:12 PM  Clinical Narrative:    Tube feeding and pump are in place for patient.  Pump is being delivered to the house.  Suction machine was delivered on [id number] and the nebulizer was delivered to the room.   Expected Discharge Plan: [place] Barriers to Discharge: Barriers Resolved  Expected Discharge Plan and Services Expected Discharge Plan: [place]   Discharge Planning Services: CM Consult   Living arrangements for the past 2 months: Single Family Home                 DME Arranged: Nebulizer machine, Suction DME Agency: [employer] Date DME Agency Contacted: 10/14/22 Time DME Agency Contacted: [phone] Representative spoke with at DME Agency: [place]: RN, PT, OT, Nurse's Aide [place] Agency: [place] ([place]) Date [place]: 10/14/22 Time [place]: [phone] Representative spoke with at [place]: [place] ([place]) Interventions    Readmission Risk Interventions   No data to display

## 2022-10-15 NOTE — Plan of Care (Signed)
Discharge instructions reviewed with patient and wife, questions answered, verbalized understanding. Patient transported via wheelchair to main entrance to be taken home by wife.  4 bottles of osmolite sent with patient, [employer] to deliver feeding pump and other supplies this evening per wife.

## 2022-10-15 NOTE — Discharge Summary (Incomplete)
Physician Discharge Summary   Patient: Lee Barnes MRN: [id number] DOB: August 04, 1951  Admit date:     10/06/2022  Discharge date: 10/15/22  Discharge Physician: Jacquelin Hawking, MD   PCP: Ronnald Nian, MD   Recommendations at discharge:  Continuous tube feeds Hospital follow-up with PCP  Discharge Diagnoses: Principal Problem:   Sepsis due to aspiration pneumonia Active Problems:   History of oropharyngeal cancer   Neurogenic orthostatic hypotension (HCC)   Chronic hyponatremia   Acute respiratory failure with hypoxia (HCC)   GERD (gastroesophageal reflux disease)   Protein-calorie malnutrition, severe   Hypertensive urgency   Normocytic anemia  Resolved Problems:   * No resolved hospital problems. *  Hospital Course: Lee Barnes is a 71 y.o. 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  Patient presented secondary to fatigue and dyspnea after being found in the bathroom short of breath.  On admission, patient was found to have evidence of aspiration pneumonia with a right lung field infiltrate.  Empiric Unasyn started on admission.  Patient with physiology signs consistent with sepsis. Supplemental oxygen applied for acute hypoxia and has been weaned to room air. Bolus tube feeds contributing to reflux. GI and dietitian consulted. Patient transitioned to continuous tube feeds.  Assessment and Plan: ***  Consultants: Gastroenterology Procedures performed: None  Disposition: Home health Diet recommendation: NPO   Tube feed regimen: Osmolite 1.5 at 95 ml/h x15 hours (0600-2100) Prosource TF20 60 ml daily Provides 2218 kcal, 109 gm protein, 1086 ml free water daily free water flushes every 4 hours (as tolerated) = 1846mL/day    DISCHARGE MEDICATION: Allergies  as of 10/15/2022       Reactions   Codeine Nausea And Vomiting        Medication List     TAKE these medications    albuterol (2.5 MG/3ML) 0.083% nebulizer solution Commonly known as: PROVENTIL Take 3 mLs (2.5 mg total) by nebulization every 4 (four) hours as needed for wheezing.   cetirizine 10 MG chewable tablet Commonly known as: ZYRTEC Place 10 mg into feeding tube daily as needed for allergies or rhinitis.   feeding supplement (OSMOLITE 1.5 CAL) Liqd Place 1,425 mLs into feeding tube daily. Start taking on: October 16, 2022 What changed:  how much to take when to take this additional instructions Another medication with the same name was removed. Continue taking this medication, and follow the directions you see here.   feeding supplement (PROSource TF20) liquid Place 60 mLs into feeding tube daily. Start taking on: October 16, 2022   fluticasone 50 MCG/ACT nasal spray Commonly known as: FLONASE SPRAY TWO SPRAYS IN EACH NOSTRIL ONCE DAILY AS NEEDED What changed:  how much to take how to take this when to take this additional instructions   guaiFENesin 100 MG/5ML liquid Commonly known as: ROBITUSSIN Place 15 mLs into feeding tube every 6 (six) hours as needed for cough or to loosen phlegm.   loperamide 2 MG capsule Commonly known as: IMODIUM 2 mg See admin instructions. Place 2 mg into feeding tube daily as needed for diarrhea. Usually given during a feeding.   midodrine 2.5 MG tablet Commonly known as: PROAMATINE Place 2.5 mg into feeding tube See admin instructions. Place 2.5 mg into feeding tube one to three times a day as needed for hypotension   omeprazole 40 MG capsule Commonly known as:  PRILOSEC Removed the granules from the capsule and place him in the G-tube daily. What changed:  how much to take when to take this additional instructions   Pedialyte Soln Place 355 mLs into feeding tube daily.   Systane Ultra PF 0.4-0.3 % Soln Generic  drug: Polyethyl Glyc-Propyl Glyc PF Place 1 drop into both eyes daily as needed (for irritation).               Durable Medical Equipment  (From admission, onward)           Start     Ordered   10/12/22 1630  For home use only DME Nebulizer machine  Once       Question Answer Comment  Patient needs a nebulizer to treat with the following condition COPD (chronic obstructive pulmonary disease) (HCC)   Length of Need Lifetime      10/12/22 1629   10/12/22 1630  For home use only DME Suction  Once       Question:  Suction  Answer:  Oral   10/12/22 1629            Follow-up Information     Ronnald Nian, MD. Schedule an appointment as soon as possible for a visit in 1 week(s).   Specialty: Family Medicine Why: For hospital follow-up Contact information: [address] [place] [address] [phone]                Discharge Exam: Ceasar Mons Weights   10/06/22 0810  Weight: 74.4 kg   ***  Condition at discharge: {DC Condition:26389}  The results of significant diagnostics from this hospitalization (including imaging, microbiology, ancillary and laboratory) are listed below for reference.   Imaging Studies: DG Chest 2 View  Result Date: 10/10/2022 CLINICAL DATA:  Acid reflux. EXAM: CHEST - 2 VIEW COMPARISON:  10/06/2022 FINDINGS: Stable asymmetric elevation of the right hemidiaphragm with diffuse patchy airspace disease in the right lung. Left lung clear. The cardio pericardial silhouette is enlarged. The visualized bony structures of the thorax are unremarkable. IMPRESSION: Asymmetric elevation of the right hemidiaphragm with diffuse patchy airspace disease in the right lung. The dense parahilar consolidative opacity seen in the right lung previously has decreased in the interval. Imaging features remain suspicious for pneumonia. Electronically Signed   By: Kennith Center M.D.   On: 10/10/2022 09:34   DG Abd Portable 1V  Result Date:  10/07/2022 CLINICAL DATA:  Nausea and vomiting. EXAM: PORTABLE ABDOMEN - 1 VIEW COMPARISON:  None Available. FINDINGS: The bowel gas pattern is normal. A stable 1.2 cm linear radiopaque density is present over the right upper quadrant and unchanged from multiple prior exams. No radio-opaque calculi or other significant radiographic abnormality are seen. IMPRESSION: Nonobstructive bowel-gas pattern. Electronically Signed   By: Thornell Sartorius M.D.   On: 10/07/2022 20:12   DG Chest Port 1 View  Result Date: 10/06/2022 CLINICAL DATA:  Questionable sepsis EXAM: PORTABLE CHEST 1 VIEW COMPARISON:  07/08/2022 FINDINGS: Extensive airspace disease on the right. Chronic elevation of the right diaphragm. Normal heart size and mediastinal contours. Artifact from EKG leads. IMPRESSION: Extensive infiltrate on the right. Followup PA and lateral chest X-ray is recommended in 3-4 weeks following trial of antibiotic therapy to ensure resolution. Chronic elevation of the right diaphragm. Electronically Signed   By: Tiburcio Pea M.D.   On: 10/06/2022 07:04    Microbiology: Results for orders placed or performed during the hospital encounter of 10/06/22  Blood Culture (routine x 2)  Status: None   Collection Time: 10/06/22  6:25 AM   Specimen: BLOOD  Result Value Ref Range Status   Specimen Description   Final    BLOOD LEFT ANTECUBITAL Performed at [facility], [address] [place], [place] [address]    Special Requests   Final    BOTTLES DRAWN AEROBIC AND ANAEROBIC Blood Culture results may not be optimal due to an excessive volume of blood received in culture bottles Performed at [facility], [address], [place], [place] [address]    Culture   Final    NO GROWTH 5 DAYS Performed at [facility], [address] [place], [place] [address]    Report Status 10/11/2022 FINAL  Final  Resp panel by RT-PCR (RSV, Flu A&B, Covid) Anterior Nasal Swab      Status: None   Collection Time: 10/06/22  6:57 AM   Specimen: Anterior Nasal Swab  Result Value Ref Range Status   SARS Coronavirus 2 by RT PCR NEGATIVE NEGATIVE Final    Comment: (NOTE) SARS-CoV-2 target nucleic acids are NOT DETECTED.  The SARS-CoV-2 RNA is generally detectable in upper respiratory specimens during the acute phase of infection. The lowest concentration of SARS-CoV-2 viral copies this assay can detect is 138 copies/mL. A negative result does not preclude SARS-Cov-2 infection and should not be used as the sole basis for treatment or other patient management decisions. A negative result may occur with  improper specimen collection/handling, submission of specimen other than nasopharyngeal swab, presence of viral mutation(s) within the areas targeted by this assay, and inadequate number of viral copies(<138 copies/mL). A negative result must be combined with clinical observations, patient history, and epidemiological information. The expected result is Negative.  Fact Sheet for Patients:  BloggerCourse.com  Fact Sheet for Healthcare Providers:  SeriousBroker.it  This test is no t yet approved or cleared by the [place] FDA and  has been authorized for detection and/or diagnosis of SARS-CoV-2 by FDA under an Emergency Use Authorization (EUA). This EUA will remain  in effect (meaning this test can be used) for the duration of the COVID-19 declaration under Section 564(b)(1) of the Act, 21 U.S.C.section 360bbb-3(b)(1), unless the authorization is terminated  or revoked sooner.       Influenza A by PCR NEGATIVE NEGATIVE Final   Influenza B by PCR NEGATIVE NEGATIVE Final    Comment: (NOTE) The Xpert Xpress SARS-CoV-2/FLU/RSV plus assay is intended as an aid in the diagnosis of influenza from Nasopharyngeal swab specimens and should not be used as a sole basis for treatment. Nasal washings and aspirates are  unacceptable for Xpert Xpress SARS-CoV-2/FLU/RSV testing.  Fact Sheet for Patients: BloggerCourse.com  Fact Sheet for Healthcare Providers: SeriousBroker.it  This test is not yet approved or cleared by the [place] FDA and has been authorized for detection and/or diagnosis of SARS-CoV-2 by FDA under an Emergency Use Authorization (EUA). This EUA will remain in effect (meaning this test can be used) for the duration of the COVID-19 declaration under Section 564(b)(1) of the Act, 21 U.S.C. section 360bbb-3(b)(1), unless the authorization is terminated or revoked.     Resp Syncytial Virus by PCR NEGATIVE NEGATIVE Final    Comment: (NOTE) Fact Sheet for Patients: BloggerCourse.com  Fact Sheet for Healthcare Providers: SeriousBroker.it  This test is not yet approved or cleared by the [place] FDA and has been authorized for detection and/or diagnosis of SARS-CoV-2 by FDA under an Emergency Use Authorization (EUA). This  EUA will remain in effect (meaning this test can be used) for the duration of the COVID-19 declaration under Section 564(b)(1) of the Act, 21 U.S.C. section 360bbb-3(b)(1), unless the authorization is terminated or revoked.  Performed at [facility], [address] [place], [place] [address]   Urine Culture     Status: Abnormal   Collection Time: 10/06/22  7:09 AM   Specimen: In/Out Cath Urine  Result Value Ref Range Status   Specimen Description   Final    IN/OUT CATH URINE Performed at [facility], [address], [place], [place] [address]    Special Requests   Final    NONE Performed at [facility], [address] [place], [place] [address]    Culture 5,000 COLONIES/mL STAPHYLOCOCCUS HAEMOLYTICUS (A)  Final   Report Status 10/08/2022 FINAL  Final   Organism ID, Bacteria STAPHYLOCOCCUS  HAEMOLYTICUS (A)  Final      Susceptibility   Staphylococcus haemolyticus - MIC*    CIPROFLOXACIN <=0.5 SENSITIVE Sensitive     GENTAMICIN <=0.5 SENSITIVE Sensitive     NITROFURANTOIN <=16 SENSITIVE Sensitive     OXACILLIN <=0.25 SENSITIVE Sensitive     TETRACYCLINE <=1 SENSITIVE Sensitive     VANCOMYCIN 2 SENSITIVE Sensitive     TRIMETH/SULFA <=10 SENSITIVE Sensitive     CLINDAMYCIN <=0.25 SENSITIVE Sensitive     RIFAMPIN <=0.5 SENSITIVE Sensitive     Inducible Clindamycin NEGATIVE Sensitive     * 5,000 COLONIES/mL STAPHYLOCOCCUS HAEMOLYTICUS  Blood Culture (routine x 2)     Status: None   Collection Time: 10/06/22  8:26 AM   Specimen: BLOOD  Result Value Ref Range Status   Specimen Description   Final    BLOOD BLOOD RIGHT HAND Performed at [facility], [address], [place], [place] [address]    Special Requests   Final    BOTTLES DRAWN AEROBIC AND ANAEROBIC Blood Culture adequate volume Performed at [facility], [address] [place], [place] [address]    Culture   Final    NO GROWTH 5 DAYS Performed at [facility], [address] [place], [place] [address]    Report Status 10/11/2022 FINAL  Final    Labs: CBC: Recent Labs  Lab 10/09/22 0950 10/10/22 0409 10/11/22 0351 10/12/22 0409 10/13/22 0646  WBC 9.6 7.3 6.5 4.8 5.8  HGB 12.0* 10.0* 10.1* 10.5* 12.1*  HCT 37.1* 30.5* 31.5* 32.9* 38.0*  MCV 90.0 89.4 90.5 90.9 91.1  PLT 148* 152 153 186 245   Basic Metabolic Panel: Recent Labs  Lab 10/09/22 0950 10/10/22 0409 10/11/22 0351 10/12/22 0409 10/13/22 0646  NA 132* 132* 132* 133* 134*  K 4.0 3.8 3.7 3.7 4.2  CL 99 98 97* 98 96*  CO2 24 25 25 25 27   GLUCOSE 93 84 92 85 94  BUN 18 19 20 22 19   CREATININE 0.81 0.74 0.74 0.76 0.77  CALCIUM 8.8* 8.3* 8.3* 8.4* 8.8*   Liver Function Tests: Recent Labs  Lab 10/09/22 0950 10/10/22 0409 10/12/22 0409  AST 31 25 24   ALT 24 22 25   ALKPHOS 75 60 61   BILITOT 1.0 0.8 0.9  PROT 7.6 6.1* 6.4*  ALBUMIN 3.5 2.8* 2.8*   CBG: No results for input(s): "GLUCAP" in the last 168 hours.  Discharge time spent: {LESS THAN/GREATER VHQI:69629} 30 minutes.  Signed: Jacquelin Hawking, MD [facility] 10/15/2022

## 2022-10-16 ENCOUNTER — Telehealth: Payer: Self-pay

## 2022-10-16 NOTE — Patient Outreach (Signed)
  Care Coordination TOC Note Transition Care Management Follow-up Telephone Call Date of discharge and from where: 10/15/22-[facility] Dx: [facility]. PNA How have you been since you were released from the hospital? Call completed with both spouse and patient. They report things very busy and hectic yesterday evening. Patient's feeding pump did not get delivered until 9:30pm. Therefore, they were not able to start his feedings until 6am this morning. Feedings are going fine so far. Patient denies any resp sxs or issues at present.  Any questions or concerns? No  Items Reviewed: Did the pt receive and understand the discharge instructions provided? Yes  Medications obtained and verified? Yes -Spouse reports she is waiting for pharmacy to call her regarding meds ready for pickup Other? Yes  Any new allergies since your discharge? No  Dietary orders reviewed? Yes Do you have support at home? Yes   Home Care and Equipment/Supplies: Were home health services ordered? yes If so, what is the name of the agency? [facility]   Has the agency set up a time to come to the patient's home? No-discussed with them to contact agency if they have not heard from them within 48-72hs post discharge Were any new equipment or medical supplies ordered?  Yes: feeding pump,nebulizer,suction machine What is the name of the medical supply agency? [facility] Were you able to get the supplies/equipment? yes Do you have any questions related to the use of the equipment or supplies? No-Spouse stets that they are awaiting additional power cords for pump but in the meantime pump remains operational and in use  Functional Questionnaire: (I = Independent and D = Dependent) ADLs: A  Bathing/Dressing- A  Meal Prep- A  Eating- N/A  Maintaining continence- I  Transferring/Ambulation- I  Managing Meds- A  Follow up appointments reviewed:  PCP Hospital f/u appt confirmed? Yes  Scheduled to see Dr. Redmond School on 10/23/22 @  2:30pm. [facility] f/u appt confirmed?  N/A  . Are transportation arrangements needed? No  If their condition worsens, is the pt aware to call PCP or go to the Emergency Dept.? Yes Was the patient provided with contact information for the PCP's office or ED? Yes Was to pt encouraged to call back with questions or concerns? Yes  SDOH assessments and interventions completed:   Yes SDOH Interventions Today    Flowsheet Row Most Recent Value  SDOH Interventions   Food Insecurity Interventions Intervention Not Indicated  Transportation Interventions Intervention Not Indicated       Care Coordination Interventions:  Education provided    Encounter Outcome:  Pt. Visit Completed    Enzo Montgomery, RN,BSN,CCM [place] Management Telephonic Care Management Coordinator Direct Phone: [phone] Toll Free: [phone] Fax: [phone]

## 2022-10-18 ENCOUNTER — Telehealth: Payer: Self-pay | Admitting: Family Medicine

## 2022-10-18 DIAGNOSIS — K5790 Diverticulosis of intestine, part unspecified, without perforation or abscess without bleeding: Secondary | ICD-10-CM | POA: Diagnosis not present

## 2022-10-18 DIAGNOSIS — Z9181 History of falling: Secondary | ICD-10-CM | POA: Diagnosis not present

## 2022-10-18 DIAGNOSIS — I10 Essential (primary) hypertension: Secondary | ICD-10-CM | POA: Diagnosis not present

## 2022-10-18 DIAGNOSIS — R1310 Dysphagia, unspecified: Secondary | ICD-10-CM | POA: Diagnosis not present

## 2022-10-18 DIAGNOSIS — Z85828 Personal history of other malignant neoplasm of skin: Secondary | ICD-10-CM | POA: Diagnosis not present

## 2022-10-18 DIAGNOSIS — G903 Multi-system degeneration of the autonomic nervous system: Secondary | ICD-10-CM | POA: Diagnosis not present

## 2022-10-18 DIAGNOSIS — J690 Pneumonitis due to inhalation of food and vomit: Secondary | ICD-10-CM | POA: Diagnosis not present

## 2022-10-18 DIAGNOSIS — Z85818 Personal history of malignant neoplasm of other sites of lip, oral cavity, and pharynx: Secondary | ICD-10-CM | POA: Diagnosis not present

## 2022-10-18 DIAGNOSIS — K219 Gastro-esophageal reflux disease without esophagitis: Secondary | ICD-10-CM | POA: Diagnosis not present

## 2022-10-18 DIAGNOSIS — E871 Hypo-osmolality and hyponatremia: Secondary | ICD-10-CM | POA: Diagnosis not present

## 2022-10-18 DIAGNOSIS — Z431 Encounter for attention to gastrostomy: Secondary | ICD-10-CM | POA: Diagnosis not present

## 2022-10-18 NOTE — Telephone Encounter (Signed)
Inez Catalina from [facility] called and asks if you can sign off on orders for home health

## 2022-10-23 ENCOUNTER — Encounter: Payer: Self-pay | Admitting: Family Medicine

## 2022-10-23 ENCOUNTER — Ambulatory Visit (INDEPENDENT_AMBULATORY_CARE_PROVIDER_SITE_OTHER): Payer: Medicare Other | Admitting: Family Medicine

## 2022-10-23 VITALS — BP 112/82 | HR 87 | Temp 98.1°F | Wt 164.2 lb

## 2022-10-23 DIAGNOSIS — Z23 Encounter for immunization: Secondary | ICD-10-CM

## 2022-10-23 DIAGNOSIS — E871 Hypo-osmolality and hyponatremia: Secondary | ICD-10-CM

## 2022-10-23 DIAGNOSIS — Z85819 Personal history of malignant neoplasm of unspecified site of lip, oral cavity, and pharynx: Secondary | ICD-10-CM

## 2022-10-23 DIAGNOSIS — Z8701 Personal history of pneumonia (recurrent): Secondary | ICD-10-CM | POA: Diagnosis not present

## 2022-10-23 MED ORDER — ONDANSETRON HCL 4 MG/5ML PO SOLN: 4.0000 mg | Freq: Three times a day (TID) | ORAL | 1 refills | Status: DC | PRN

## 2022-10-23 NOTE — Discharge Summary (Signed)
Physician Discharge Summary   Patient: Lee Barnes MRN: [id number] DOB: 06-16-1951  Admit date:     10/06/2022  Discharge date: 10/15/2022  Discharge Physician: Jacquelin Hawking, MD   PCP: Ronnald Nian, MD   Recommendations at discharge:  PCP and GI follow-up  Discharge Diagnoses: Principal Problem:   Sepsis due to aspiration pneumonia Active Problems:   History of oropharyngeal cancer   Neurogenic orthostatic hypotension (HCC)   Chronic hyponatremia   Acute respiratory failure with hypoxia (HCC)   GERD (gastroesophageal reflux disease)   Protein-calorie malnutrition, severe   Hypertensive urgency   Normocytic anemia  Resolved Problems:   * No resolved hospital problems. *  Hospital Course: Lee Barnes is a 71 y.o. 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  Patient presented secondary to fatigue and dyspnea after being found in the bathroom short of breath.  On admission, patient was found to have evidence of aspiration pneumonia with a right lung field infiltrate.  Empiric Unasyn started on admission.  Patient with physiology signs consistent with sepsis. Supplemental oxygen applied for acute hypoxia and has been weaned to room air. Bolus tube feeds contributing to reflux. GI and dietitian consulted. Patient transitioned to continuous tube feeds and reached goal rate of 95 mL/hr.  Assessment and Plan:  Sepsis Present on admission.  Secondary to aspiration pneumonia.  Blood cultures obtained on admission. Empiric Unasyn started for treatment of aspiration pneumonia. Blood cultures with no growth.   Aspiration pneumonia In setting of known dysphagia and PEG tube feeds. Imaging with right sided infiltrate consistent with infection. Empiric Unasyn initiated. IV lost and  patient transitioned to Augmentin to complete course.   Acute respiratory failure with hypoxia Secondary to aspiration pneumonia. Patient required non-rebreather on admission and weaned down to room air.   Oropharyngeal cancer Dysphagia Patient is status post PEG tube.  Unsure if tube feed regimen is contributing to aspiration episodes. GI and dietitian consulted. Patient transitioned to continuous tube feeds. Initial recommendation to start at 40 mL/hr with titration by 10 mL/hr every two hours to reach goal rate of 105 mL/hr. New goal rate of 95 mL/hr.  Dietitian recommendations (11/27): Osmolite 1.5 at 95 ml/h x15 hours (0600-2100) Prosource TF20 60 ml daily Provides 2218 kcal, 109 gm protein, 1086 ml free water daily free water flushes every 4 hours (as tolerated) = 1873mL/day     Neurogenic orthostatic hypotension Noted. Supine blood pressure high during admission. Resume midodrine PRN as an outpatient.   Severe malnutrition Tube feeds as mentioned above   Chronic hyponatremia Mild. asymptomatic   GERD Continue Prilosec    Consultants: Gastroenterology Procedures performed: None  Disposition: Home health Diet recommendation: NPO   DISCHARGE MEDICATION: Allergies as of 10/15/2022       Reactions   Codeine Nausea And Vomiting        Medication List     TAKE these medications    albuterol (2.5 MG/3ML) 0.083% nebulizer solution Commonly known as: PROVENTIL Take 3 mLs (2.5 mg total) by nebulization every 4 (four) hours as needed for wheezing.   cetirizine 10 MG chewable tablet Commonly known as: ZYRTEC Place 10 mg into feeding tube daily as needed for allergies or rhinitis.   feeding supplement (OSMOLITE 1.5 CAL) Liqd Place 1,425 mLs into feeding tube daily. What changed:  how much to take when to take this  additional instructions Another medication with the same name was removed. Continue taking this medication, and follow the directions you see  here.   feeding supplement (PROSource TF20) liquid Place 60 mLs into feeding tube daily.   fluticasone 50 MCG/ACT nasal spray Commonly known as: FLONASE SPRAY TWO SPRAYS IN EACH NOSTRIL ONCE DAILY AS NEEDED What changed:  how much to take how to take this when to take this additional instructions   guaiFENesin 100 MG/5ML liquid Commonly known as: ROBITUSSIN Place 15 mLs into feeding tube every 6 (six) hours as needed for cough or to loosen phlegm.   loperamide 2 MG capsule Commonly known as: IMODIUM 2 mg See admin instructions. Place 2 mg into feeding tube daily as needed for diarrhea. Usually given during a feeding.   midodrine 2.5 MG tablet Commonly known as: PROAMATINE Place 2.5 mg into feeding tube See admin instructions. Place 2.5 mg into feeding tube one to three times a day as needed for hypotension   omeprazole 40 MG capsule Commonly known as: PRILOSEC Removed the granules from the capsule and place him in the G-tube daily. What changed:  how much to take when to take this additional instructions Notes to patient: 10/16/2022    Pedialyte Soln Place 355 mLs into feeding tube daily.   Systane Ultra PF 0.4-0.3 % Soln Generic drug: Polyethyl Glyc-Propyl Glyc PF Place 1 drop into both eyes daily as needed (for irritation).        Follow-up Information     Ronnald Nian, MD. Schedule an appointment as soon as possible for a visit in 1 week(s).   Specialty: Family Medicine Why: For hospital follow-up Contact information: [address] [place] [address] [phone]                Discharge Exam: BP 125/85 (BP Location: Right Arm)   Pulse 85   Temp 98.6 F (37 C) (Oral)   Resp 18   Ht 6' (1.829 m)   Wt 74.4 kg   SpO2 91%   BMI 22.24 kg/m   General exam: Appears calm and comfortable Respiratory system: Clear to auscultation. Respiratory effort normal. Cardiovascular system: S1 & S2 heard, RRR. No murmurs, rubs, gallops or  clicks. Gastrointestinal system: Abdomen is nondistended, soft and nontender. Normal bowel sounds heard. Central nervous system: Alert and oriented. No focal neurological deficits. Musculoskeletal: No edema. No calf tenderness Skin: No cyanosis. No rashes Psychiatry: Judgement and insight appear normal. Mood & affect appropriate.   Condition at discharge: stable  The results of significant diagnostics from this hospitalization (including imaging, microbiology, ancillary and laboratory) are listed below for reference.   Imaging Studies: DG Chest 2 View  Result Date: 10/10/2022 CLINICAL DATA:  Acid reflux. EXAM: CHEST - 2 VIEW COMPARISON:  10/06/2022 FINDINGS: Stable asymmetric elevation of the right hemidiaphragm with diffuse patchy airspace disease in the right lung. Left lung clear. The cardio pericardial silhouette is enlarged. The visualized bony structures of the thorax are unremarkable. IMPRESSION: Asymmetric elevation of the right hemidiaphragm with diffuse patchy airspace disease in the right lung. The dense parahilar consolidative opacity seen in the right lung previously has decreased in the interval. Imaging features remain suspicious for pneumonia. Electronically Signed   By: Kennith Center M.D.   On: 10/10/2022 09:34   DG Abd Portable 1V  Result Date: 10/07/2022 CLINICAL DATA:  Nausea and vomiting. EXAM: PORTABLE ABDOMEN - 1 VIEW COMPARISON:  None Available. FINDINGS: The bowel gas pattern is normal. A stable 1.2 cm  linear radiopaque density is present over the right upper quadrant and unchanged from multiple prior exams. No radio-opaque calculi or other significant radiographic abnormality are seen. IMPRESSION: Nonobstructive bowel-gas pattern. Electronically Signed   By: Thornell Sartorius M.D.   On: 10/07/2022 20:12   DG Chest Port 1 View  Result Date: 10/06/2022 CLINICAL DATA:  Questionable sepsis EXAM: PORTABLE CHEST 1 VIEW COMPARISON:  07/08/2022 FINDINGS: Extensive airspace  disease on the right. Chronic elevation of the right diaphragm. Normal heart size and mediastinal contours. Artifact from EKG leads. IMPRESSION: Extensive infiltrate on the right. Followup PA and lateral chest X-ray is recommended in 3-4 weeks following trial of antibiotic therapy to ensure resolution. Chronic elevation of the right diaphragm. Electronically Signed   By: Tiburcio Pea M.D.   On: 10/06/2022 07:04    Microbiology: Results for orders placed or performed during the hospital encounter of 10/06/22  Blood Culture (routine x 2)     Status: None   Collection Time: 10/06/22  6:25 AM   Specimen: BLOOD  Result Value Ref Range Status   Specimen Description   Final    BLOOD LEFT ANTECUBITAL Performed at [facility], [address], [place], [place] [address]    Special Requests   Final    BOTTLES DRAWN AEROBIC AND ANAEROBIC Blood Culture results may not be optimal due to an excessive volume of blood received in culture bottles Performed at [facility], [address], [place], [place] [address]    Culture   Final    NO GROWTH 5 DAYS Performed at [facility], [address] [place], [place] [address]    Report Status 10/11/2022 FINAL  Final  Resp panel by RT-PCR (RSV, Flu A&B, Covid) Anterior Nasal Swab     Status: None   Collection Time: 10/06/22  6:57 AM   Specimen: Anterior Nasal Swab  Result Value Ref Range Status   SARS Coronavirus 2 by RT PCR NEGATIVE NEGATIVE Final    Comment: (NOTE) SARS-CoV-2 target nucleic acids are NOT DETECTED.  The SARS-CoV-2 RNA is generally detectable in upper respiratory specimens during the acute phase of infection. The lowest concentration of SARS-CoV-2 viral copies this assay can detect is 138 copies/mL. A negative result does not preclude SARS-Cov-2 infection and should not be used as the sole basis for treatment or other patient management decisions. A negative result may occur with  improper  specimen collection/handling, submission of specimen other than nasopharyngeal swab, presence of viral mutation(s) within the areas targeted by this assay, and inadequate number of viral copies(<138 copies/mL). A negative result must be combined with clinical observations, patient history, and epidemiological information. The expected result is Negative.  Fact Sheet for Patients:  BloggerCourse.com  Fact Sheet for Healthcare Providers:  SeriousBroker.it  This test is no t yet approved or cleared by the [place] FDA and  has been authorized for detection and/or diagnosis of SARS-CoV-2 by FDA under an Emergency Use Authorization (EUA). This EUA will remain  in effect (meaning this test can be used) for the duration of the COVID-19 declaration under Section 564(b)(1) of the Act, 21 U.S.C.section 360bbb-3(b)(1), unless the authorization is terminated  or revoked sooner.       Influenza A by PCR NEGATIVE NEGATIVE Final   Influenza B by PCR NEGATIVE NEGATIVE Final    Comment: (NOTE) The Xpert Xpress SARS-CoV-2/FLU/RSV plus assay is intended as an aid in the diagnosis of influenza from Nasopharyngeal swab specimens and should not  be used as a sole basis for treatment. Nasal washings and aspirates are unacceptable for Xpert Xpress SARS-CoV-2/FLU/RSV testing.  Fact Sheet for Patients: BloggerCourse.com  Fact Sheet for Healthcare Providers: SeriousBroker.it  This test is not yet approved or cleared by the [place] FDA and has been authorized for detection and/or diagnosis of SARS-CoV-2 by FDA under an Emergency Use Authorization (EUA). This EUA will remain in effect (meaning this test can be used) for the duration of the COVID-19 declaration under Section 564(b)(1) of the Act, 21 U.S.C. section 360bbb-3(b)(1), unless the authorization is terminated or revoked.     Resp  Syncytial Virus by PCR NEGATIVE NEGATIVE Final    Comment: (NOTE) Fact Sheet for Patients: BloggerCourse.com  Fact Sheet for Healthcare Providers: SeriousBroker.it  This test is not yet approved or cleared by the [place] FDA and has been authorized for detection and/or diagnosis of SARS-CoV-2 by FDA under an Emergency Use Authorization (EUA). This EUA will remain in effect (meaning this test can be used) for the duration of the COVID-19 declaration under Section 564(b)(1) of the Act, 21 U.S.C. section 360bbb-3(b)(1), unless the authorization is terminated or revoked.  Performed at [facility], [address], [place], [place] [address]   Urine Culture     Status: Abnormal   Collection Time: 10/06/22  7:09 AM   Specimen: In/Out Cath Urine  Result Value Ref Range Status   Specimen Description   Final    IN/OUT CATH URINE Performed at [facility], [address] [place], [place] [address]    Special Requests   Final    NONE Performed at [facility], [address], [place], [place] [address]    Culture 5,000 COLONIES/mL STAPHYLOCOCCUS HAEMOLYTICUS (A)  Final   Report Status 10/08/2022 FINAL  Final   Organism ID, Bacteria STAPHYLOCOCCUS HAEMOLYTICUS (A)  Final      Susceptibility   Staphylococcus haemolyticus - MIC*    CIPROFLOXACIN <=0.5 SENSITIVE Sensitive     GENTAMICIN <=0.5 SENSITIVE Sensitive     NITROFURANTOIN <=16 SENSITIVE Sensitive     OXACILLIN <=0.25 SENSITIVE Sensitive     TETRACYCLINE <=1 SENSITIVE Sensitive     VANCOMYCIN 2 SENSITIVE Sensitive     TRIMETH/SULFA <=10 SENSITIVE Sensitive     CLINDAMYCIN <=0.25 SENSITIVE Sensitive     RIFAMPIN <=0.5 SENSITIVE Sensitive     Inducible Clindamycin NEGATIVE Sensitive     * 5,000 COLONIES/mL STAPHYLOCOCCUS HAEMOLYTICUS  Blood Culture (routine x 2)     Status: None   Collection Time: 10/06/22  8:26 AM    Specimen: BLOOD  Result Value Ref Range Status   Specimen Description   Final    BLOOD BLOOD RIGHT HAND Performed at [facility], [address], [place], [place] [address]    Special Requests   Final    BOTTLES DRAWN AEROBIC AND ANAEROBIC Blood Culture adequate volume Performed at [facility], [address], [place], [place] [address]    Culture   Final    NO GROWTH 5 DAYS Performed at [facility], [address], [place], [place] [address]    Report Status 10/11/2022 FINAL  Final    Discharge time spent: 35 minutes.  Signed: Jacquelin Hawking, MD [facility] 10/23/2022

## 2022-10-23 NOTE — Progress Notes (Signed)
Subjective:    Patient ID: Lee Barnes, male    DOB: 05-Jul-1951, 71 y.o.   MRN: [id number]  HPI He is here for posthospitalization visit.  Medication reconciliation was done and reviewed.  He was in the hospital from November 19 to 28.  He had difficulty with aspiration and pneumonia.  The medical record including discharge blood work was evaluated.  He now is doing continuous feeding which seems to be going fairly well.  He is somewhat fatigued and has noted some slight voice changes.   Review of Systems     Objective:   Physical Exam Alert and in no distress.  Exam of the throat shows no changes from previous evaluation.  Cardiac exam shows regular rhythm without murmurs or gallops.  Lungs clear to auscultation.  Abdominal exam does show the G-tube to be in place with no evidence of infection.       Assessment & Plan:  History of aspiration pneumonia - Plan: CBC with Differential/Platelet, Comprehensive metabolic panel  Need for COVID-19 vaccine - Plan: Pfizer Fall 2023 Covid-19 Vaccine 83yrs and older  Need for influenza vaccination - Plan: Flu vaccine HIGH DOSE PF (Fluzone High dose)  Chronic hyponatremia - Plan: Comprehensive metabolic panel  History of oropharyngeal cancer - Plan: CBC with Differential/Platelet, Comprehensive metabolic panel Had a good discussion with him and his wife concerning the fact that this is the third episode of admission for aspiration pneumonia and with the last admission he was in for a longer period of time.  Also had discussion with him concerning intubation and DNR present to get to him with the idea that individuals can need to be dealt with. At the end of the encounter he then had an episode of vomiting.  I will call Zofran solution which should be able to be placed down the G-tube.  Cautioned that it he continues to have vomiting we will have hydration issues as well as possible aspiration issues and the vigilant to return to the  hospital. And discussed the fact that he is going to have relatives in town for Christmas and strongly encouraged to use of masking.

## 2022-10-24 ENCOUNTER — Encounter: Payer: Self-pay | Admitting: Family Medicine

## 2022-10-24 LAB — CBC WITH DIFFERENTIAL/PLATELET
Basophils Absolute: 0 10*3/uL (ref 0.0–0.2)
Basos: 1 %
EOS (ABSOLUTE): 0.2 10*3/uL (ref 0.0–0.4)
Eos: 2 %
Hematocrit: 38.3 % (ref 37.5–51.0)
Hemoglobin: 12.9 g/dL — ABNORMAL LOW (ref 13.0–17.7)
Immature Grans (Abs): 0.1 10*3/uL (ref 0.0–0.1)
Immature Granulocytes: 1 %
Lymphocytes Absolute: 0.6 10*3/uL — ABNORMAL LOW (ref 0.7–3.1)
Lymphs: 9 %
MCH: 29.5 pg (ref 26.6–33.0)
MCHC: 33.7 g/dL (ref 31.5–35.7)
MCV: 87 fL (ref 79–97)
Monocytes Absolute: 1.1 10*3/uL — ABNORMAL HIGH (ref 0.1–0.9)
Monocytes: 17 %
Neutrophils Absolute: 4.3 10*3/uL (ref 1.4–7.0)
Neutrophils: 70 %
Platelets: 481 10*3/uL — ABNORMAL HIGH (ref 150–450)
RBC: 4.38 x10E6/uL (ref 4.14–5.80)
RDW: 13.7 % (ref 11.6–15.4)
WBC: 6.2 10*3/uL (ref 3.4–10.8)

## 2022-10-24 LAB — COMPREHENSIVE METABOLIC PANEL
ALT: 28 IU/L (ref 0–44)
AST: 19 IU/L (ref 0–40)
Albumin/Globulin Ratio: 1.3 (ref 1.2–2.2)
Albumin: 4.2 g/dL (ref 3.9–4.9)
Alkaline Phosphatase: 137 IU/L — ABNORMAL HIGH (ref 44–121)
BUN/Creatinine Ratio: 32 — ABNORMAL HIGH (ref 10–24)
BUN: 31 mg/dL — ABNORMAL HIGH (ref 8–27)
Bilirubin Total: 0.3 mg/dL (ref 0.0–1.2)
CO2: 26 mmol/L (ref 20–29)
Calcium: 9.7 mg/dL (ref 8.6–10.2)
Chloride: 94 mmol/L — ABNORMAL LOW (ref 96–106)
Creatinine, Ser: 0.98 mg/dL (ref 0.76–1.27)
Globulin, Total: 3.3 g/dL (ref 1.5–4.5)
Glucose: 91 mg/dL (ref 70–99)
Potassium: 5.8 mmol/L — ABNORMAL HIGH (ref 3.5–5.2)
Sodium: 133 mmol/L — ABNORMAL LOW (ref 134–144)
Total Protein: 7.5 g/dL (ref 6.0–8.5)
eGFR: 83 mL/min/{1.73_m2} (ref >59)

## 2022-10-24 MED ORDER — ONDANSETRON HCL 4 MG PO TABS: 4.0000 mg | ORAL_TABLET | Freq: Three times a day (TID) | ORAL | 1 refills | Status: DC | PRN

## 2022-10-25 ENCOUNTER — Telehealth: Payer: Self-pay

## 2022-10-25 DIAGNOSIS — E871 Hypo-osmolality and hyponatremia: Secondary | ICD-10-CM | POA: Diagnosis not present

## 2022-10-25 DIAGNOSIS — J690 Pneumonitis due to inhalation of food and vomit: Secondary | ICD-10-CM | POA: Diagnosis not present

## 2022-10-25 DIAGNOSIS — Z431 Encounter for attention to gastrostomy: Secondary | ICD-10-CM | POA: Diagnosis not present

## 2022-10-25 DIAGNOSIS — I10 Essential (primary) hypertension: Secondary | ICD-10-CM | POA: Diagnosis not present

## 2022-10-25 DIAGNOSIS — G903 Multi-system degeneration of the autonomic nervous system: Secondary | ICD-10-CM | POA: Diagnosis not present

## 2022-10-25 DIAGNOSIS — R1310 Dysphagia, unspecified: Secondary | ICD-10-CM | POA: Diagnosis not present

## 2022-10-25 NOTE — Telephone Encounter (Signed)
Inez Catalina from [facility] called wanting to know if they could send someone out to thew pts. House for a PT evaluation.

## 2022-10-29 DIAGNOSIS — Z431 Encounter for attention to gastrostomy: Secondary | ICD-10-CM | POA: Diagnosis not present

## 2022-10-29 DIAGNOSIS — R1310 Dysphagia, unspecified: Secondary | ICD-10-CM | POA: Diagnosis not present

## 2022-10-29 DIAGNOSIS — J690 Pneumonitis due to inhalation of food and vomit: Secondary | ICD-10-CM | POA: Diagnosis not present

## 2022-10-29 DIAGNOSIS — G903 Multi-system degeneration of the autonomic nervous system: Secondary | ICD-10-CM | POA: Diagnosis not present

## 2022-10-29 DIAGNOSIS — I10 Essential (primary) hypertension: Secondary | ICD-10-CM | POA: Diagnosis not present

## 2022-10-29 DIAGNOSIS — E871 Hypo-osmolality and hyponatremia: Secondary | ICD-10-CM | POA: Diagnosis not present

## 2022-10-30 DIAGNOSIS — R1310 Dysphagia, unspecified: Secondary | ICD-10-CM | POA: Diagnosis not present

## 2022-10-30 DIAGNOSIS — Z431 Encounter for attention to gastrostomy: Secondary | ICD-10-CM | POA: Diagnosis not present

## 2022-10-30 DIAGNOSIS — J690 Pneumonitis due to inhalation of food and vomit: Secondary | ICD-10-CM | POA: Diagnosis not present

## 2022-10-30 DIAGNOSIS — G903 Multi-system degeneration of the autonomic nervous system: Secondary | ICD-10-CM | POA: Diagnosis not present

## 2022-10-30 DIAGNOSIS — E871 Hypo-osmolality and hyponatremia: Secondary | ICD-10-CM | POA: Diagnosis not present

## 2022-10-30 DIAGNOSIS — I10 Essential (primary) hypertension: Secondary | ICD-10-CM | POA: Diagnosis not present

## 2022-11-01 ENCOUNTER — Ambulatory Visit (INDEPENDENT_AMBULATORY_CARE_PROVIDER_SITE_OTHER): Payer: Medicare Other

## 2022-11-01 VITALS — Ht 72.0 in | Wt 160.0 lb

## 2022-11-01 DIAGNOSIS — J690 Pneumonitis due to inhalation of food and vomit: Secondary | ICD-10-CM | POA: Diagnosis not present

## 2022-11-01 DIAGNOSIS — G903 Multi-system degeneration of the autonomic nervous system: Secondary | ICD-10-CM | POA: Diagnosis not present

## 2022-11-01 DIAGNOSIS — I10 Essential (primary) hypertension: Secondary | ICD-10-CM | POA: Diagnosis not present

## 2022-11-01 DIAGNOSIS — Z0000 Encounter for general adult medical examination without abnormal findings: Secondary | ICD-10-CM | POA: Diagnosis not present

## 2022-11-01 DIAGNOSIS — E871 Hypo-osmolality and hyponatremia: Secondary | ICD-10-CM | POA: Diagnosis not present

## 2022-11-01 DIAGNOSIS — Z431 Encounter for attention to gastrostomy: Secondary | ICD-10-CM | POA: Diagnosis not present

## 2022-11-01 DIAGNOSIS — R1310 Dysphagia, unspecified: Secondary | ICD-10-CM | POA: Diagnosis not present

## 2022-11-01 NOTE — Progress Notes (Signed)
I connected with Cherylann Banas today by telephone and verified that I am speaking with the correct person using two identifiers. Location patient: home Location provider: work Persons participating in the virtual visit: Lee Barnes, Albion Weatherholtz (wife), Glenna Durand LPN.   I discussed the limitations, risks, security and privacy concerns of performing an evaluation and management service by telephone and the availability of in person appointments. I also discussed with the patient that there may be a patient responsible charge related to this service. The patient expressed understanding and verbally consented to this telephonic visit.    Interactive audio and video telecommunications were attempted between this provider and patient, however failed, due to patient having technical difficulties OR patient did not have access to video capability.  We continued and completed visit with audio only.     Vital signs may be patient reported or missing.  Subjective:   Lee Barnes is a 71 y.o. male who presents for Medicare Annual/Subsequent preventive examination.  Review of Systems     Cardiac Risk Factors include: advanced age (>9mn, >>52women);hypertension;male gender     Objective:    Today's Vitals   11/01/22 1126  Weight: 160 lb (72.6 kg)  Height: 6' (1.829 m)   Body mass index is 21.7 kg/m.     11/01/2022   11:32 AM 10/06/2022    4:42 PM 07/08/2022   11:56 AM 06/25/2022   10:39 PM 04/10/2022    9:26 AM 11/29/2021    8:12 PM 04/02/2021    8:31 AM  Advanced Directives  Does Patient Have a Medical Advance Directive? Yes Yes No No No No No  Type of AParamedicof AE[place] will [place]      Does patient want to make changes to medical advance directive?  No - Patient declined       Copy of [place] Chart? No - copy requested        Would patient like information on creating a medical advance  directive?   No - Patient declined No - Patient declined Yes (ED - Information included in AVS) No - Patient declined Yes (ED - Information included in AVS)    Current Medications (verified) Outpatient Encounter Medications as of 11/01/2022  Medication Sig   albuterol (PROVENTIL) (2.5 MG/3ML) 0.083% nebulizer solution Take 3 mLs (2.5 mg total) by nebulization every 4 (four) hours as needed for wheezing.   cetirizine (ZYRTEC) 10 MG chewable tablet Place 10 mg into feeding tube daily as needed for allergies or rhinitis.   fluticasone (FLONASE) 50 MCG/ACT nasal spray SPRAY TWO SPRAYS IN EACH NOSTRIL ONCE DAILY AS NEEDED (Patient taking differently: Place 2 sprays into both nostrils at bedtime.)   guaiFENesin (ROBITUSSIN) 100 MG/5ML liquid Place 15 mLs into feeding tube every 6 (six) hours as needed for cough or to loosen phlegm.   loperamide (IMODIUM) 2 MG capsule 2 mg See admin instructions. Place 2 mg into feeding tube daily as needed for diarrhea. Usually given during a feeding.   Nutritional Supplements (FEEDING SUPPLEMENT, OSMOLITE 1.5 CAL,) LIQD Place 1,425 mLs into feeding tube daily.   omeprazole (PRILOSEC) 40 MG capsule Removed the granules from the capsule and place him in the G-tube daily. (Patient taking differently: 40 mg See admin instructions. Remove the granules from the capsule (40 mg) and place in the G-tube daily)   ondansetron (ZOFRAN) 4 MG tablet Take 1 tablet (4 mg total) by mouth every 8 (eight)  hours as needed for nausea or vomiting.   Pedialyte (PEDIALYTE) SOLN Place 355 mLs into feeding tube daily.   Protein (FEEDING SUPPLEMENT, PROSOURCE TF20,) liquid Place 60 mLs into feeding tube daily.   SYSTANE ULTRA PF 0.4-0.3 % SOLN Place 1 drop into both eyes daily as needed (for irritation).   midodrine (PROAMATINE) 2.5 MG tablet Place 2.5 mg into feeding tube See admin instructions. Place 2.5 mg into feeding tube one to three times a day as needed for hypotension (Patient not taking:  Reported on 11/01/2022)   No facility-administered encounter medications on file as of 11/01/2022.    Allergies (verified) Codeine   History: Past Medical History:  Diagnosis Date   Allergy to environmental factors    Basal cell carcinoma of left cheek 1/61/0960   Complication of anesthesia    "vageled" after surgery -overnite stay   Diverticulosis 2008   Heart murmur    hx of in childhood    History of chemotherapy    History of radiation therapy 11/05/10-12/26/10   r base tongue, 7000 cGy 35 sessions   Hypertension    Medication started in May 2016.   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   Tinnitus    Past Surgical History:  Procedure Laterality Date   APPENDECTOMY     COLONOSCOPY     INGUINAL HERNIA REPAIR Bilateral 03/30/2015   Procedure: LAPAROSCOPIC BILATERAL INGUINAL HERNIA REPAIR WITH MESH;  Surgeon: Coralie Keens, MD;  Location: [facility];  Service: General;  Laterality: Bilateral;   INGUINAL HERNIA REPAIR Right 11/18/1958   INGUINAL HERNIA REPAIR Bilateral 03/30/2015   INGUINAL HERNIA REPAIR Left 07/11/2015   INGUINAL HERNIA REPAIR N/A 07/11/2015   Procedure: REPAIR OF LEFT INGUINAL HERNIA WITH MESH;  Surgeon: Coralie Keens, MD;  Location: [facility];  Service: General;  Laterality: N/A;   INSERTION OF MESH N/A 07/11/2015   Procedure: INSERTION OF MESH;  Surgeon: Coralie Keens, MD;  Location: [facility];  Service: General;  Laterality: N/A;   IR [place] W/FLUORO  12/03/2021   LUMBAR [place] SURGERY     PEG PLACEMENT N/A 03/22/2022   Procedure: PERCUTANEOUS ENDOSCOPIC GASTROSTOMY (PEG) REPLACEMENT;  Surgeon: Carol Ada, MD;  Location: [facility];  Service: Gastroenterology;  Laterality: N/A;   SAVORY DILATION N/A 03/22/2022   Procedure: SAVORY DILATION;  Surgeon: Carol Ada, MD;  Location: [facility];  Service: Gastroenterology;  Laterality: N/A;   SKIN BIOPSY  Left 06/24/2022   basal cell carcinoma face cheek   TONSILLECTOMY     Family History  Problem Relation Age of Onset   Heart disease Father    Cancer Father        throat ca   Heart disease Mother    Colon cancer Neg Hx    Social History   Socioeconomic History   Marital status: Married    Spouse name: Not on file   Number of children: 3   Years of education: Not on file   Highest education level: Not on file  Occupational History   Occupation: OWNER    Employer: [employer]  Tobacco Use   Smoking status: Never   Smokeless tobacco: Never  Vaping Use   Vaping Use: Never used  Substance and Sexual Activity   Alcohol use: Yes    Comment: occas drinks a beer every few weeks    Drug use: No  Comment:     Sexual activity: Yes  Other Topics Concern   Not on file  Social History Narrative   Not on file   Social Determinants of Health   Financial Resource Strain: Low Risk  (11/01/2022)   Overall Financial Resource Strain (CARDIA)    Difficulty of Paying Living Expenses: Not hard at all  Food Insecurity: No Food Insecurity (11/01/2022)   Hunger Vital Sign    Worried About Running Out of Food in the Last Year: Never true    Ran Out of Food in the Last Year: Never true  Transportation Needs: No Transportation Needs (11/01/2022)   PRAPARE - Hydrologist (Medical): No    Lack of Transportation (Non-Medical): No  Physical Activity: Inactive (11/01/2022)   Exercise Vital Sign    Days of Exercise per Week: 0 days    Minutes of Exercise per Session: 0 min  Stress: No Stress Concern Present (11/01/2022)   [place]    Feeling of Stress : Not at all  Social Connections: Not on file    Tobacco Counseling Counseling given: Not Answered   Clinical Intake:  Pre-visit preparation completed: Yes  Pain : No/denies pain     Nutritional Risks: Nausea/ vomitting/ diarrhea  (on a liquid diet with feeding tube) Diabetes: No  How often do you need to have someone help you when you read instructions, pamphlets, or other written materials from your doctor or pharmacy?: 1 - Never  Diabetic? no  Interpreter Needed?: No  Information entered by :: NAllen LPN   Activities of Daily Living    11/01/2022   11:33 AM 10/06/2022    4:55 PM  In your present state of health, do you have any difficulty performing the following activities:  Hearing? 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 0   Difficulty concentrating or making decisions? 0   Walking or climbing stairs? 0   Dressing or bathing? 0   Doing errands, shopping? 0 0  Preparing Food and eating ? N   Using the Toilet? N   In the past six months, have you accidently leaked urine? N   Do you have problems with loss of bowel control? Y   Managing your Medications? N   Managing your Finances? N   Housekeeping or managing your Housekeeping? N     Patient Care Team: Denita Lung, MD as PCP - General (Family Medicine) Rozetta Nunnery, MD (Inactive) Arloa Koh, MD (Inactive) as Attending Physician (Radiation Oncology) Heath Lark, MD as Consulting Physician (Hematology and Oncology)  Indicate any recent Medical Services you may have received from other than [facility] providers in the past year (date may be approximate).     Assessment:   This is a routine wellness examination for [facility].  Hearing/Vision screen Vision Screening - Comments:: Regular eye exams, Dr. Gershon Crane  Dietary issues and exercise activities discussed: Current Exercise Habits: The patient does not participate in regular exercise at present (works with PT)   Goals Addressed             This Visit's Progress    Patient Stated       11/01/2022, wants to get stronger       Depression Screen    11/01/2022   11:33 AM 04/10/2022    9:27 AM 04/02/2021    8:32 AM 03/31/2020    8:46 AM 03/30/2019  8:53 AM 11/19/2017    9:02 AM 07/27/2015    2:27 PM  PHQ 2/9 Scores  PHQ - 2 Score 0 0 0 0 0 0 0  PHQ- 9 Score 0          Fall Risk    11/01/2022   11:33 AM 04/10/2022    9:26 AM 04/02/2021    8:32 AM 03/31/2020    8:46 AM 03/30/2019    8:53 AM  Fall Risk   Falls in the past year? 0 0 0 0 0  Number falls in past yr: 0 0 0    Injury with Fall? 0 0 0    Risk for fall due to : Medication side effect No Fall Risks No Fall Risks    Follow up Falls prevention discussed;Education provided;Falls evaluation completed Falls evaluation completed Falls evaluation completed      FALL RISK PREVENTION PERTAINING TO THE HOME:  Any stairs in or around the home? Yes  If so, are there any without handrails? No  Home free of loose throw rugs in walkways, pet beds, electrical cords, etc? Yes  Adequate lighting in your home to reduce risk of falls? Yes   ASSISTIVE DEVICES UTILIZED TO PREVENT FALLS:  Life alert? No  Use of a cane, walker or w/c? No  Grab bars in the bathroom? No  Shower chair or bench in shower? Yes  Elevated toilet seat or a handicapped toilet? No   TIMED UP AND GO:  Was the test performed? No .      Cognitive Function:        11/01/2022   11:36 AM 11/19/2017    9:06 AM  6CIT Screen  What Year? 0 points 0 points  What month? 0 points 0 points  What time? 0 points 0 points  Count back from 20 0 points 0 points  Months in reverse 0 points 0 points  Repeat phrase 0 points 0 points  Total Score 0 points 0 points    Immunizations Immunization History  Administered Date(s) Administered   COVID-19, mRNA, vaccine(Comirnaty)12 years and older 10/23/2022   Fluad Quad(high Dose 65+) 09/25/2021, 10/23/2022   Influenza Split 10/07/2011, 09/23/2012   Influenza, High Dose Seasonal PF 10/13/2017, 09/22/2018   Influenza, Quadrivalent, Recombinant, Inj, Pf 08/23/2019   Influenza-Unspecified 09/19/2019   Moderna Covid-19 Vaccine Bivalent Booster 36yr & up 09/25/2021    Moderna SARS-COV2 Booster Vaccination 02/28/2021   Moderna Sars-Covid-2 Vaccination 12/07/2019, 01/04/2020   Pneumococcal Conjugate-13 11/19/2017   Pneumococcal-Unspecified 08/18/2014   Tdap 10/06/2014   Zoster, Live 12/14/2013    TDAP status: Up to date  Flu Vaccine status: Up to date  Pneumococcal vaccine status: Up to date  Covid-19 vaccine status: Completed vaccines  Qualifies for Shingles Vaccine? Yes   Zostavax completed Yes   Shingrix Completed?: No.    Education has been provided regarding the importance of this vaccine. Patient has been advised to call insurance company to determine out of pocket expense if they have not yet received this vaccine. Advised may also receive vaccine at local pharmacy or Health Dept. Verbalized acceptance and understanding.  Screening Tests Health Maintenance  Topic Date Due   Zoster Vaccines- Shingrix (1 of 2) Never done   Pneumonia Vaccine 71 Years old (2 - PPSV23 or PCV20) 11/19/2018   Medicare Annual Wellness (AWV)  04/02/2022   COVID-19 Vaccine (5 - 2023-24 season) 12/18/2022   COLONOSCOPY (Pts 45-457yrInsurance coverage will need to be confirmed)  01/21/2024   DTaP/Tdap/Td (2 -  Td or Tdap) 10/06/2024   INFLUENZA VACCINE  Completed   Hepatitis C Screening  Completed   HPV VACCINES  Aged Out    Health Maintenance  Health Maintenance Due  Topic Date Due   Zoster Vaccines- Shingrix (1 of 2) Never done   Pneumonia Vaccine 62+ Years old (2 - PPSV23 or PCV20) 11/19/2018   Medicare Annual Wellness (AWV)  04/02/2022    Colorectal cancer screening: Type of screening: Colonoscopy. Completed 01/20/2014. Repeat every 10 years  Lung Cancer Screening: (Low Dose CT Chest recommended if Age 33-80 years, 30 pack-year currently smoking OR have quit w/in 15years.) does not qualify.   Lung Cancer Screening Referral: no  Additional Screening:  Hepatitis C Screening: does qualify; Completed 02/05/2016  Vision Screening: Recommended annual  ophthalmology exams for early detection of glaucoma and other disorders of the eye. Is the patient up to date with their annual eye exam?  Yes  Who is the provider or what is the name of the office in which the patient attends annual eye exams? Dr. Gershon Crane If pt is not established with a provider, would they like to be referred to a provider to establish care? No .   Dental Screening: Recommended annual dental exams for proper oral hygiene  Community Resource Referral / Chronic Care Management: CRR required this visit?  No   CCM required this visit?  No      Plan:     I have personally reviewed and noted the following in the patient's chart:   Medical and social history Use of alcohol, tobacco or illicit drugs  Current medications and supplements including opioid prescriptions. Patient is not currently taking opioid prescriptions. Functional ability and status Nutritional status Physical activity Advanced directives List of other physicians Hospitalizations, surgeries, and ER visits in previous 12 months Vitals Screenings to include cognitive, depression, and falls Referrals and appointments  In addition, I have reviewed and discussed with patient certain preventive protocols, quality metrics, and best practice recommendations. A written personalized care plan for preventive services as well as general preventive health recommendations were provided to patient.     Kellie Simmering, LPN   [id number]   Nurse Notes: none  Due to this being a virtual visit, the after visit summary with patients personalized plan was offered to patient via mail or my-chart.  Patient would like to access on my-chart

## 2022-11-01 NOTE — Patient Instructions (Signed)
Lee Barnes , Thank you for taking time to come for your Medicare Wellness Visit. I appreciate your ongoing commitment to your health goals. Please review the following plan we discussed and let me know if I can assist you in the future.   These are the goals we discussed:  Goals      Patient Stated     11/01/2022, wants to get stronger        This is a list of the screening recommended for you and due dates:  Health Maintenance  Topic Date Due   Zoster (Shingles) Vaccine (1 of 2) Never done   Pneumonia Vaccine (2 - PPSV23 or PCV20) 11/19/2018   COVID-19 Vaccine (5 - 2023-24 season) 12/18/2022   Medicare Annual Wellness Visit  11/02/2023   Colon Cancer Screening  01/21/2024   DTaP/Tdap/Td vaccine (2 - Td or Tdap) 10/06/2024   Flu Shot  Completed   Hepatitis C Screening: USPSTF Recommendation to screen - Ages 18-79 yo.  Completed   HPV Vaccine  Aged Out    Advanced directives: Please bring a copy of your POA (Power of [place]) and/or Living Will to your next appointment.   Conditions/risks identified: none  Next appointment: Follow up in one year for your annual wellness visit.   Preventive Care 87 Years and Older, Male  Preventive care refers to lifestyle choices and visits with your health care provider that can promote health and wellness. What does preventive care include? A yearly physical exam. This is also called an annual well check. Dental exams once or twice a year. Routine eye exams. Ask your health care provider how often you should have your eyes checked. Personal lifestyle choices, including: Daily care of your teeth and gums. Regular physical activity. Eating a healthy diet. Avoiding tobacco and drug use. Limiting alcohol use. Practicing safe sex. Taking low doses of aspirin every day. Taking vitamin and mineral supplements as recommended by your health care provider. What happens during an annual well check? The services and screenings done by your  health care provider during your annual well check will depend on your age, overall health, lifestyle risk factors, and family history of disease. Counseling  Your health care provider may ask you questions about your: Alcohol use. Tobacco use. Drug use. Emotional well-being. Home and relationship well-being. Sexual activity. Eating habits. History of falls. Memory and ability to understand (cognition). Work and work Statistician. Screening  You may have the following tests or measurements: Height, weight, and BMI. Blood pressure. Lipid and cholesterol levels. These may be checked every 5 years, or more frequently if you are over 45 years old. Skin check. Lung cancer screening. You may have this screening every year starting at age 60 if you have a 30-pack-year history of smoking and currently smoke or have quit within the past 15 years. Fecal occult blood test (FOBT) of the stool. You may have this test every year starting at age 34. Flexible sigmoidoscopy or colonoscopy. You may have a sigmoidoscopy every 5 years or a colonoscopy every 10 years starting at age 57. Prostate cancer screening. Recommendations will vary depending on your family history and other risks. Hepatitis C blood test. Hepatitis B blood test. Sexually transmitted disease (STD) testing. Diabetes screening. This is done by checking your blood sugar (glucose) after you have not eaten for a while (fasting). You may have this done every 1-3 years. Abdominal aortic aneurysm (AAA) screening. You may need this if you are a current or former smoker. Osteoporosis.  You may be screened starting at age 47 if you are at high risk. Talk with your health care provider about your test results, treatment options, and if necessary, the need for more tests. Vaccines  Your health care provider may recommend certain vaccines, such as: Influenza vaccine. This is recommended every year. Tetanus, diphtheria, and acellular pertussis  (Tdap, Td) vaccine. You may need a Td booster every 10 years. Zoster vaccine. You may need this after age 25. Pneumococcal 13-valent conjugate (PCV13) vaccine. One dose is recommended after age 41. Pneumococcal polysaccharide (PPSV23) vaccine. One dose is recommended after age 28. Talk to your health care provider about which screenings and vaccines you need and how often you need them. This information is not intended to replace advice given to you by your health care provider. Make sure you discuss any questions you have with your health care provider. Document Released: 12/01/2015 Document Revised: 07/24/2016 Document Reviewed: 09/05/2015 Elsevier Interactive Patient Education  2017 [employer] Prevention in the Home Falls can cause injuries. They can happen to people of all ages. There are many things you can do to make your home safe and to help prevent falls. What can I do on the outside of my home? Regularly fix the edges of walkways and driveways and fix any cracks. Remove anything that might make you trip as you walk through a door, such as a raised step or threshold. Trim any bushes or trees on the path to your home. Use bright outdoor lighting. Clear any walking paths of anything that might make someone trip, such as rocks or tools. Regularly check to see if handrails are loose or broken. Make sure that both sides of any steps have handrails. Any raised decks and porches should have guardrails on the edges. Have any leaves, snow, or ice cleared regularly. Use sand or salt on walking paths during winter. Clean up any spills in your garage right away. This includes oil or grease spills. What can I do in the bathroom? Use night lights. Install grab bars by the toilet and in the tub and shower. Do not use towel bars as grab bars. Use non-skid mats or decals in the tub or shower. If you need to sit down in the shower, use a plastic, non-slip stool. Keep the floor dry. Clean  up any water that spills on the floor as soon as it happens. Remove soap buildup in the tub or shower regularly. Attach bath mats securely with double-sided non-slip rug tape. Do not have throw rugs and other things on the floor that can make you trip. What can I do in the bedroom? Use night lights. Make sure that you have a light by your bed that is easy to reach. Do not use any sheets or blankets that are too big for your bed. They should not hang down onto the floor. Have a firm chair that has side arms. You can use this for support while you get dressed. Do not have throw rugs and other things on the floor that can make you trip. What can I do in the kitchen? Clean up any spills right away. Avoid walking on wet floors. Keep items that you use a lot in easy-to-reach places. If you need to reach something above you, use a strong step stool that has a grab bar. Keep electrical cords out of the way. Do not use floor polish or wax that makes floors slippery. If you must use wax, use non-skid floor wax.  Do not have throw rugs and other things on the floor that can make you trip. What can I do with my stairs? Do not leave any items on the stairs. Make sure that there are handrails on both sides of the stairs and use them. Fix handrails that are broken or loose. Make sure that handrails are as long as the stairways. Check any carpeting to make sure that it is firmly attached to the stairs. Fix any carpet that is loose or worn. Avoid having throw rugs at the top or bottom of the stairs. If you do have throw rugs, attach them to the floor with carpet tape. Make sure that you have a light switch at the top of the stairs and the bottom of the stairs. If you do not have them, ask someone to add them for you. What else can I do to help prevent falls? Wear shoes that: Do not have high heels. Have rubber bottoms. Are comfortable and fit you well. Are closed at the toe. Do not wear sandals. If you  use a stepladder: Make sure that it is fully opened. Do not climb a closed stepladder. Make sure that both sides of the stepladder are locked into place. Ask someone to hold it for you, if possible. Clearly mark and make sure that you can see: Any grab bars or handrails. First and last steps. Where the edge of each step is. Use tools that help you move around (mobility aids) if they are needed. These include: Canes. Walkers. Scooters. Crutches. Turn on the lights when you go into a dark area. Replace any light bulbs as soon as they burn out. Set up your furniture so you have a clear path. Avoid moving your furniture around. If any of your floors are uneven, fix them. If there are any pets around you, be aware of where they are. Review your medicines with your doctor. Some medicines can make you feel dizzy. This can increase your chance of falling. Ask your doctor what other things that you can do to help prevent falls. This information is not intended to replace advice given to you by your health care provider. Make sure you discuss any questions you have with your health care provider. Document Released: 08/31/2009 Document Revised: 04/11/2016 Document Reviewed: 12/09/2014 Elsevier Interactive Patient Education  2017 [employer].

## 2022-11-05 DIAGNOSIS — J690 Pneumonitis due to inhalation of food and vomit: Secondary | ICD-10-CM | POA: Diagnosis not present

## 2022-11-05 DIAGNOSIS — G903 Multi-system degeneration of the autonomic nervous system: Secondary | ICD-10-CM | POA: Diagnosis not present

## 2022-11-05 DIAGNOSIS — E871 Hypo-osmolality and hyponatremia: Secondary | ICD-10-CM | POA: Diagnosis not present

## 2022-11-05 DIAGNOSIS — Z431 Encounter for attention to gastrostomy: Secondary | ICD-10-CM | POA: Diagnosis not present

## 2022-11-05 DIAGNOSIS — R1310 Dysphagia, unspecified: Secondary | ICD-10-CM | POA: Diagnosis not present

## 2022-11-05 DIAGNOSIS — I10 Essential (primary) hypertension: Secondary | ICD-10-CM | POA: Diagnosis not present

## 2022-11-06 DIAGNOSIS — E871 Hypo-osmolality and hyponatremia: Secondary | ICD-10-CM | POA: Diagnosis not present

## 2022-11-06 DIAGNOSIS — T17908D Unspecified foreign body in respiratory tract, part unspecified causing other injury, subsequent encounter: Secondary | ICD-10-CM | POA: Diagnosis not present

## 2022-11-06 DIAGNOSIS — R1310 Dysphagia, unspecified: Secondary | ICD-10-CM | POA: Diagnosis not present

## 2022-11-06 DIAGNOSIS — G903 Multi-system degeneration of the autonomic nervous system: Secondary | ICD-10-CM | POA: Diagnosis not present

## 2022-11-06 DIAGNOSIS — J386 Stenosis of larynx: Secondary | ICD-10-CM | POA: Diagnosis not present

## 2022-11-06 DIAGNOSIS — C029 Malignant neoplasm of tongue, unspecified: Secondary | ICD-10-CM | POA: Diagnosis not present

## 2022-11-06 DIAGNOSIS — Z931 Gastrostomy status: Secondary | ICD-10-CM | POA: Diagnosis not present

## 2022-11-06 DIAGNOSIS — I10 Essential (primary) hypertension: Secondary | ICD-10-CM | POA: Diagnosis not present

## 2022-11-06 DIAGNOSIS — Z431 Encounter for attention to gastrostomy: Secondary | ICD-10-CM | POA: Diagnosis not present

## 2022-11-06 DIAGNOSIS — J690 Pneumonitis due to inhalation of food and vomit: Secondary | ICD-10-CM | POA: Diagnosis not present

## 2022-11-06 DIAGNOSIS — J3801 Paralysis of vocal cords and larynx, unilateral: Secondary | ICD-10-CM | POA: Diagnosis not present

## 2022-11-08 DIAGNOSIS — R1310 Dysphagia, unspecified: Secondary | ICD-10-CM | POA: Diagnosis not present

## 2022-11-08 DIAGNOSIS — G903 Multi-system degeneration of the autonomic nervous system: Secondary | ICD-10-CM | POA: Diagnosis not present

## 2022-11-08 DIAGNOSIS — E871 Hypo-osmolality and hyponatremia: Secondary | ICD-10-CM | POA: Diagnosis not present

## 2022-11-08 DIAGNOSIS — Z431 Encounter for attention to gastrostomy: Secondary | ICD-10-CM | POA: Diagnosis not present

## 2022-11-08 DIAGNOSIS — J690 Pneumonitis due to inhalation of food and vomit: Secondary | ICD-10-CM | POA: Diagnosis not present

## 2022-11-08 DIAGNOSIS — I10 Essential (primary) hypertension: Secondary | ICD-10-CM | POA: Diagnosis not present

## 2022-11-12 ENCOUNTER — Observation Stay (HOSPITAL_COMMUNITY)
Admission: EM | Admit: 2022-11-12 | Discharge: 2022-11-14 | Disposition: A | Discharge status: Still In Hospital | Payer: Medicare Other | Attending: Internal Medicine | Admitting: Internal Medicine

## 2022-11-12 ENCOUNTER — Encounter (HOSPITAL_COMMUNITY): Payer: Self-pay | Admitting: Emergency Medicine

## 2022-11-12 ENCOUNTER — Other Ambulatory Visit: Payer: Self-pay

## 2022-11-12 ENCOUNTER — Emergency Department (HOSPITAL_COMMUNITY): Payer: Medicare Other

## 2022-11-12 DIAGNOSIS — R1111 Vomiting without nausea: Secondary | ICD-10-CM | POA: Diagnosis not present

## 2022-11-12 DIAGNOSIS — Z931 Gastrostomy status: Secondary | ICD-10-CM | POA: Insufficient documentation

## 2022-11-12 DIAGNOSIS — Z85828 Personal history of other malignant neoplasm of skin: Secondary | ICD-10-CM | POA: Insufficient documentation

## 2022-11-12 DIAGNOSIS — Z1152 Encounter for screening for COVID-19: Secondary | ICD-10-CM | POA: Diagnosis not present

## 2022-11-12 DIAGNOSIS — R2689 Other abnormalities of gait and mobility: Secondary | ICD-10-CM | POA: Insufficient documentation

## 2022-11-12 DIAGNOSIS — R059 Cough, unspecified: Secondary | ICD-10-CM | POA: Diagnosis not present

## 2022-11-12 DIAGNOSIS — R197 Diarrhea, unspecified: Secondary | ICD-10-CM | POA: Insufficient documentation

## 2022-11-12 DIAGNOSIS — E871 Hypo-osmolality and hyponatremia: Secondary | ICD-10-CM | POA: Diagnosis not present

## 2022-11-12 DIAGNOSIS — E875 Hyperkalemia: Secondary | ICD-10-CM | POA: Diagnosis present

## 2022-11-12 DIAGNOSIS — I10 Essential (primary) hypertension: Secondary | ICD-10-CM | POA: Diagnosis not present

## 2022-11-12 DIAGNOSIS — R1310 Dysphagia, unspecified: Secondary | ICD-10-CM | POA: Diagnosis not present

## 2022-11-12 DIAGNOSIS — Z743 Need for continuous supervision: Secondary | ICD-10-CM | POA: Diagnosis not present

## 2022-11-12 DIAGNOSIS — Z431 Encounter for attention to gastrostomy: Secondary | ICD-10-CM | POA: Diagnosis not present

## 2022-11-12 DIAGNOSIS — M6281 Muscle weakness (generalized): Secondary | ICD-10-CM | POA: Diagnosis not present

## 2022-11-12 DIAGNOSIS — R000 Tachycardia, unspecified: Secondary | ICD-10-CM | POA: Diagnosis not present

## 2022-11-12 DIAGNOSIS — J9600 Acute respiratory failure, unspecified whether with hypoxia or hypercapnia: Secondary | ICD-10-CM | POA: Diagnosis not present

## 2022-11-12 DIAGNOSIS — J690 Pneumonitis due to inhalation of food and vomit: Secondary | ICD-10-CM | POA: Diagnosis present

## 2022-11-12 DIAGNOSIS — R5383 Other fatigue: Secondary | ICD-10-CM | POA: Diagnosis not present

## 2022-11-12 DIAGNOSIS — K529 Noninfective gastroenteritis and colitis, unspecified: Secondary | ICD-10-CM | POA: Diagnosis present

## 2022-11-12 DIAGNOSIS — R1110 Vomiting, unspecified: Secondary | ICD-10-CM | POA: Diagnosis present

## 2022-11-12 DIAGNOSIS — R0602 Shortness of breath: Secondary | ICD-10-CM | POA: Diagnosis not present

## 2022-11-12 DIAGNOSIS — K219 Gastro-esophageal reflux disease without esophagitis: Secondary | ICD-10-CM | POA: Diagnosis present

## 2022-11-12 DIAGNOSIS — Z85818 Personal history of malignant neoplasm of other sites of lip, oral cavity, and pharynx: Secondary | ICD-10-CM | POA: Insufficient documentation

## 2022-11-12 DIAGNOSIS — J309 Allergic rhinitis, unspecified: Secondary | ICD-10-CM | POA: Diagnosis present

## 2022-11-12 DIAGNOSIS — Z79899 Other long term (current) drug therapy: Secondary | ICD-10-CM | POA: Diagnosis not present

## 2022-11-12 DIAGNOSIS — G903 Multi-system degeneration of the autonomic nervous system: Secondary | ICD-10-CM | POA: Diagnosis not present

## 2022-11-12 NOTE — ED Provider Triage Note (Signed)
Emergency Medicine Provider Triage Evaluation Note  Lee Barnes , a 71 y.o. male  was evaluated in triage.  Pt complains of vomiting.  He has a history of dysphagia and G-tube.  He has a history of recurrent episodes of aspiration and aspiration pneumonia.  Today he was out with his family and on the way home became carsick.  He got chills and then had 3 episodes of vomiting.  He is concerned that he aspirated.  He denies any fever or shortness of breath at this time.  Review of Systems  Positive: vomiting Negative: sob  Physical Exam  BP 108/85 (BP Location: Right Arm)   Pulse (!) 119   Temp 98.6 F (37 C) (Oral)   Resp 18   SpO2 93%  Gen:   Awake, no distress   Resp:  Normal effort  MSK:   Moves extremities without difficulty  Other:    Medical Decision Making  Medically screening exam initiated at 10:41 PM.  Appropriate orders placed.  CHUCKIE MCCATHERN was informed that the remainder of the evaluation will be completed by another provider, this initial triage assessment does not replace that evaluation, and the importance of remaining in the ED until their evaluation is complete.     Margarita Mail, PA-C 11/12/22 2246

## 2022-11-12 NOTE — ED Triage Notes (Signed)
BIBA Per EMS pt coming from home w/ c/o N/V, fatigue. Denies pain. Pt initial O2 sats were 89% RA, 2L placed on pt.  471G systolic BP.  527 CBG  Feeding tube due to multiple times pt has had aspiration pneumonia.

## 2022-11-13 DIAGNOSIS — K219 Gastro-esophageal reflux disease without esophagitis: Secondary | ICD-10-CM | POA: Diagnosis not present

## 2022-11-13 DIAGNOSIS — K529 Noninfective gastroenteritis and colitis, unspecified: Secondary | ICD-10-CM | POA: Diagnosis not present

## 2022-11-13 DIAGNOSIS — J690 Pneumonitis due to inhalation of food and vomit: Secondary | ICD-10-CM | POA: Diagnosis not present

## 2022-11-13 DIAGNOSIS — E875 Hyperkalemia: Secondary | ICD-10-CM | POA: Diagnosis present

## 2022-11-13 DIAGNOSIS — R1312 Dysphagia, oropharyngeal phase: Secondary | ICD-10-CM

## 2022-11-13 HISTORY — DX: Pneumonitis due to inhalation of food and vomit: J69.0

## 2022-11-13 LAB — CBC
HCT: 35.8 % — ABNORMAL LOW (ref 39.0–52.0)
Hemoglobin: 11.3 g/dL — ABNORMAL LOW (ref 13.0–17.0)
MCH: 28.5 pg (ref 26.0–34.0)
MCHC: 31.6 g/dL (ref 30.0–36.0)
MCV: 90.4 fL (ref 80.0–100.0)
Platelets: 194 10*3/uL (ref 150–400)
RBC: 3.96 MIL/uL — ABNORMAL LOW (ref 4.22–5.81)
RDW: 15.2 % (ref 11.5–15.5)
WBC: 12.9 10*3/uL — ABNORMAL HIGH (ref 4.0–10.5)
nRBC: 0 % (ref 0.0–0.2)

## 2022-11-13 LAB — COMPREHENSIVE METABOLIC PANEL
ALT: 18 U/L (ref 0–44)
AST: 25 U/L (ref 15–41)
Albumin: 3.9 g/dL (ref 3.5–5.0)
Alkaline Phosphatase: 90 U/L (ref 38–126)
Anion gap: 11 (ref 5–15)
BUN: 39 mg/dL — ABNORMAL HIGH (ref 8–23)
CO2: 25 mmol/L (ref 22–32)
Calcium: 9.3 mg/dL (ref 8.9–10.3)
Chloride: 98 mmol/L (ref 98–111)
Creatinine, Ser: 1.17 mg/dL (ref 0.61–1.24)
GFR, Estimated: >60 mL/min (ref >60)
Glucose, Bld: 115 mg/dL — ABNORMAL HIGH (ref 70–99)
Potassium: 5.4 mmol/L — ABNORMAL HIGH (ref 3.5–5.1)
Sodium: 134 mmol/L — ABNORMAL LOW (ref 135–145)
Total Bilirubin: 0.6 mg/dL (ref 0.3–1.2)
Total Protein: 7.8 g/dL (ref 6.5–8.1)

## 2022-11-13 LAB — CBC WITH DIFFERENTIAL/PLATELET
Abs Immature Granulocytes: 0.06 10*3/uL (ref 0.00–0.07)
Basophils Absolute: 0 10*3/uL (ref 0.0–0.1)
Basophils Relative: 0 %
Eosinophils Absolute: 0 10*3/uL (ref 0.0–0.5)
Eosinophils Relative: 0 %
HCT: 40.2 % (ref 39.0–52.0)
Hemoglobin: 12.9 g/dL — ABNORMAL LOW (ref 13.0–17.0)
Immature Granulocytes: 0 %
Lymphocytes Relative: 2 %
Lymphs Abs: 0.3 10*3/uL — ABNORMAL LOW (ref 0.7–4.0)
MCH: 28.8 pg (ref 26.0–34.0)
MCHC: 32.1 g/dL (ref 30.0–36.0)
MCV: 89.7 fL (ref 80.0–100.0)
Monocytes Absolute: 1.1 10*3/uL — ABNORMAL HIGH (ref 0.1–1.0)
Monocytes Relative: 7 %
Neutro Abs: 13.1 10*3/uL — ABNORMAL HIGH (ref 1.7–7.7)
Neutrophils Relative %: 91 %
Platelets: 226 10*3/uL (ref 150–400)
RBC: 4.48 MIL/uL (ref 4.22–5.81)
RDW: 14.9 % (ref 11.5–15.5)
WBC: 14.5 10*3/uL — ABNORMAL HIGH (ref 4.0–10.5)
nRBC: 0 % (ref 0.0–0.2)

## 2022-11-13 LAB — RESP PANEL BY RT-PCR (RSV, FLU A&B, COVID)  RVPGX2
Influenza A by PCR: NEGATIVE
Influenza B by PCR: NEGATIVE
Resp Syncytial Virus by PCR: NEGATIVE
SARS Coronavirus 2 by RT PCR: NEGATIVE

## 2022-11-13 LAB — LIPASE, BLOOD: Lipase: 50 U/L (ref 11–51)

## 2022-11-13 LAB — TROPONIN I (HIGH SENSITIVITY)
Troponin I (High Sensitivity): 9 ng/L (ref <18)
Troponin I (High Sensitivity): 7 ng/L (ref <18)

## 2022-11-13 LAB — CREATININE, SERUM
Creatinine, Ser: 1.24 mg/dL (ref 0.61–1.24)
GFR, Estimated: >60 mL/min (ref >60)

## 2022-11-13 MED ORDER — SODIUM ZIRCONIUM CYCLOSILICATE 10 G PO PACK
10.0000 g | PACK | ORAL | Status: AC
  Administered 2022-11-13: 10 g
  Filled 2022-11-13: qty 1

## 2022-11-13 MED ORDER — ONDANSETRON HCL 4 MG PO TABS: 4.0000 mg | ORAL_TABLET | Freq: Four times a day (QID) | ORAL | Status: DC | PRN

## 2022-11-13 MED ORDER — PROSOURCE TF20 ENFIT COMPATIBL EN LIQD
60.0000 mL | Freq: Every day | ENTERAL | Status: DC
  Administered 2022-11-14: 60 mL
  Filled 2022-11-13 (×2): qty 60

## 2022-11-13 MED ORDER — SODIUM CHLORIDE 0.9 % IV SOLN: INTRAVENOUS | Status: DC

## 2022-11-13 MED ORDER — SODIUM CHLORIDE 0.9 % IV SOLN
3.0000 g | Freq: Four times a day (QID) | INTRAVENOUS | Status: DC
  Administered 2022-11-13 – 2022-11-14 (×3): 3 g via INTRAVENOUS
  Filled 2022-11-13 (×4): qty 8

## 2022-11-13 MED ORDER — LOPERAMIDE HCL 2 MG PO CAPS: 2.0000 mg | ORAL_CAPSULE | ORAL | Status: DC | PRN

## 2022-11-13 MED ORDER — SODIUM CHLORIDE 0.9 % IV SOLN
3.0000 g | Freq: Once | INTRAVENOUS | Status: AC
  Administered 2022-11-13: 3 g via INTRAVENOUS
  Filled 2022-11-13: qty 8

## 2022-11-13 MED ORDER — OSMOLITE 1.5 CAL PO LIQD
1000.0000 mL | ORAL | Status: DC
  Filled 2022-11-13 (×2): qty 1000

## 2022-11-13 MED ORDER — FLUTICASONE PROPIONATE 50 MCG/ACT NA SUSP
2.0000 | Freq: Every day | NASAL | Status: DC
  Administered 2022-11-13: 2 via NASAL
  Filled 2022-11-13: qty 16

## 2022-11-13 MED ORDER — ACETAMINOPHEN 160 MG/5ML PO SOLN: 650.0000 mg | Freq: Four times a day (QID) | ORAL | Status: DC | PRN

## 2022-11-13 MED ORDER — OMEPRAZOLE 20 MG PO CPDR
40.0000 mg | DELAYED_RELEASE_CAPSULE | Freq: Every day | ORAL | Status: DC
  Administered 2022-11-14: 40 mg via ORAL
  Filled 2022-11-13 (×2): qty 2

## 2022-11-13 MED ORDER — POLYETHYLENE GLYCOL 3350 17 G PO PACK: 17.0000 g | PACK | Freq: Every day | ORAL | Status: DC | PRN

## 2022-11-13 MED ORDER — PIPERACILLIN-TAZOBACTAM 3.375 G IVPB 30 MIN
3.3750 g | Freq: Once | INTRAVENOUS | Status: AC
  Administered 2022-11-13: 3.375 g via INTRAVENOUS
  Filled 2022-11-13: qty 50

## 2022-11-13 MED ORDER — SODIUM CHLORIDE 0.9 % IV BOLUS
1000.0000 mL | Freq: Once | INTRAVENOUS | Status: AC
  Administered 2022-11-13: 1000 mL via INTRAVENOUS

## 2022-11-13 MED ORDER — OSMOLITE 1.5 CAL PO LIQD
1425.0000 mL | ORAL | Status: DC
  Filled 2022-11-13: qty 1659

## 2022-11-13 MED ORDER — ACETAMINOPHEN 650 MG RE SUPP: 650.0000 mg | Freq: Four times a day (QID) | RECTAL | Status: DC | PRN

## 2022-11-13 MED ORDER — ONDANSETRON HCL 4 MG/2ML IJ SOLN
4.0000 mg | Freq: Once | INTRAMUSCULAR | Status: AC
  Administered 2022-11-13: 4 mg via INTRAVENOUS
  Filled 2022-11-13: qty 2

## 2022-11-13 MED ORDER — GUAIFENESIN 100 MG/5ML PO LIQD
15.0000 mL | Freq: Four times a day (QID) | ORAL | Status: DC | PRN
  Administered 2022-11-14: 15 mL
  Filled 2022-11-13: qty 20

## 2022-11-13 MED ORDER — ENOXAPARIN SODIUM 40 MG/0.4ML IJ SOSY
40.0000 mg | PREFILLED_SYRINGE | INTRAMUSCULAR | Status: DC
  Administered 2022-11-13: 40 mg via SUBCUTANEOUS
  Filled 2022-11-13: qty 0.4

## 2022-11-13 MED ORDER — LORATADINE 10 MG PO TABS
10.0000 mg | ORAL_TABLET | Freq: Every day | ORAL | Status: DC
  Administered 2022-11-13 – 2022-11-14 (×2): 10 mg via ORAL
  Filled 2022-11-13 (×2): qty 1

## 2022-11-13 MED ORDER — ALBUTEROL SULFATE (2.5 MG/3ML) 0.083% IN NEBU: 2.5000 mg | INHALATION_SOLUTION | RESPIRATORY_TRACT | Status: DC | PRN

## 2022-11-13 MED ORDER — ONDANSETRON HCL 4 MG/2ML IJ SOLN: 4.0000 mg | Freq: Four times a day (QID) | INTRAMUSCULAR | Status: DC | PRN

## 2022-11-13 MED ORDER — MIDODRINE HCL 5 MG PO TABS
2.5000 mg | ORAL_TABLET | ORAL | Status: AC
  Administered 2022-11-13: 2.5 mg via ORAL
  Filled 2022-11-13: qty 1

## 2022-11-13 NOTE — Assessment & Plan Note (Signed)
Patient takes omeprazole via PEG tube at home.  Discussed with pharmacy and they have 20 mg omeprazole capsules in stock.  Pantoprazole cannot be given per tube. -- Continue omeprazole per tube

## 2022-11-13 NOTE — Assessment & Plan Note (Signed)
Due to tube feeds.  Takes Imodium as needed at home, typically every 2 to 3 days.  Imodium continued as needed.

## 2022-11-13 NOTE — Progress Notes (Signed)
A consult was received from an ED physician for Unasyn per pharmacy dosing.  The patient's profile has been reviewed for ht/wt/allergies/indication/available labs.   A one time order has been placed for Unasyn 3g IV x 1.  Further antibiotics/pharmacy consults should be ordered by admitting physician if indicated.                       Shylo Dillenbeck S. Alford Highland, PharmD, BCPS Clinical Staff Pharmacist [email or web address] Thank you, Wayland Salinas 11/13/2022  3:57 PM

## 2022-11-13 NOTE — Assessment & Plan Note (Signed)
K 5.4 on admission.  Given Lokelma in the ED. Repeat BMP in AM.

## 2022-11-13 NOTE — Assessment & Plan Note (Signed)
-   Continue Flonase  °

## 2022-11-13 NOTE — H&P (Addendum)
History and Physical    Patient: Lee Barnes DOB: Apr 11, 1951 DOA: 11/12/2022 DOS: the patient was seen and examined on 11/13/2022 PCP: Ronnald Nian, MD  Patient coming from: Home  Chief Complaint:  Chief Complaint  Patient presents with   Emesis   HPI: Lee Barnes is a 71 y.o. 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 status, prior episodes of aspiration pneumonia, hypertension, GERD, chronic diarrhea related to tube feeds who presented to the ED for evaluation of shortness of breath and chills.  Patient and wife at bedside report yesterday evening, after they had been driving around to see Holiday lights and patient developed nausea and vomiting, possibly carsickness, and had an aspiration event when vomiting and heaving.  Since then, he developed worsening shortness of breath and subjective chills but no measured fever.  He denies nausea vomiting since around 3 AM in the ER, has been treated with antiemetics.  No abdominal pain, does endorse chronic diarrhea attributed to tube feeds.  Otherwise no recent illness or sick contacts.  They confirm he is strict n.p.o. and is on continuous tube feeds at home.  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  ED course --- afebrile, heart rate 119, respirations 18-23, initial BP 108/85, dropped as low as 80/54 and was then given midodrine.  During my encounter, systolic BP was in the 160s subsequently.  Stable O2 sats on room air but borderline.  O2 sat noted to drop briefly at times to the upper 80s during my encounter, mostly with conversation.  Labs --- CBC with leukocytosis 14.5, hemoglobin 12.9.  CMP notable for sodium 134, potassium 5.4, glucose 115 and BUN of 39, creatinine  within normal at 1.17.  Respiratory panel negative for COVID-19, flu A/B and RSV.  Imaging --- chest x-ray showed patchy right upper lobe perihilar airspace disease consistent with pneumonia, chronic right hemidiaphragm elevation with associated atelectasis in the right base.  Patient was started on Unasyn in the ED for aspiration pneumonia and admitted for observation given ongoing shortness of breath and borderline O2 sats, warrants at least 24-hour close monitoring and IV antibiotic coverage.   Review of Systems: As mentioned in the history of present illness. All other systems reviewed and are negative.   Past Medical History:  Diagnosis Date   Allergy to environmental factors    Basal cell carcinoma of left cheek 07/17/2022   Complication of anesthesia    "vageled" after surgery -overnite stay   Diverticulosis 2008   Heart murmur    hx of in childhood    History of chemotherapy    History of radiation therapy 11/05/10-12/26/10   r base tongue, 7000 cGy 35 sessions   Hypertension    Medication started in May 2016.   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   Tinnitus    Past Surgical History:  Procedure Laterality Date   APPENDECTOMY     COLONOSCOPY     INGUINAL HERNIA REPAIR Bilateral 03/30/2015   Procedure: LAPAROSCOPIC BILATERAL INGUINAL HERNIA REPAIR WITH MESH;  Surgeon: Abigail Miyamoto, MD;  Location: [facility] ORS;  Service: General;  Laterality: Bilateral;   INGUINAL HERNIA REPAIR Right 11/18/1958   INGUINAL HERNIA  REPAIR Bilateral 03/30/2015   INGUINAL HERNIA REPAIR Left 07/11/2015   INGUINAL HERNIA REPAIR N/A 07/11/2015   Procedure: REPAIR OF LEFT INGUINAL HERNIA WITH MESH;  Surgeon: Abigail Miyamoto, MD;  Location: [facility] OR;  Service: General;  Laterality: N/A;   INSERTION OF MESH N/A 07/11/2015   Procedure: INSERTION OF MESH;  Surgeon: Abigail Miyamoto, MD;  Location: [facility] OR;  Service: General;   Laterality: N/A;   IR REPLC GASTRO/COLONIC TUBE PERCUT W/FLUORO  12/03/2021   LUMBAR DISC SURGERY     PEG PLACEMENT N/A 03/22/2022   Procedure: PERCUTANEOUS ENDOSCOPIC GASTROSTOMY (PEG) REPLACEMENT;  Surgeon: Jeani Hawking, MD;  Location: [facility] END[facility];  Service: Gastroenterology;  Laterality: N/A;   SAVORY DILATION N/A 03/22/2022   Procedure: SAVORY DILATION;  Surgeon: Jeani Hawking, MD;  Location: [facility];  Service: Gastroenterology;  Laterality: N/A;   SKIN BIOPSY Left 06/24/2022   basal cell carcinoma face cheek   TONSILLECTOMY     Social History:  reports that he has never smoked. He has never used smokeless tobacco. He reports current alcohol use. He reports that he does not use drugs.  Allergies  Allergen Reactions   Codeine Nausea And Vomiting    Family History  Problem Relation Age of Onset   Heart disease Father    Cancer Father        throat ca   Heart disease Mother    Colon cancer Neg Hx     Prior to Admission medications   Medication Sig Start Date End Date Taking? Authorizing Provider  albuterol (PROVENTIL) (2.5 MG/3ML) 0.083% nebulizer solution Take 3 mLs (2.5 mg total) by nebulization every 4 (four) hours as needed for wheezing. 10/15/22  Yes Narda Bonds, MD  cetirizine (ZYRTEC) 10 MG chewable tablet Place 10 mg into feeding tube daily as needed for allergies or rhinitis.   Yes [provider]  fluticasone (FLONASE) 50 MCG/ACT nasal spray SPRAY TWO SPRAYS IN EACH NOSTRIL ONCE DAILY AS NEEDED Patient taking differently: Place 2 sprays into both nostrils at bedtime. 06/05/22  Yes Ronnald Nian, MD  guaiFENesin (ROBITUSSIN) 100 MG/5ML liquid Place 15 mLs into feeding tube every 6 (six) hours as needed for cough or to loosen phlegm. 10/15/22  Yes Narda Bonds, MD  loperamide (IMODIUM) 2 MG capsule 2 mg See admin instructions. Place 2 mg into feeding tube daily as needed for diarrhea. Usually given during a feeding.   Yes [provider]   midodrine (PROAMATINE) 2.5 MG tablet Place 2.5 mg into feeding tube See admin instructions. Place 2.5 mg into feeding tube one to three times a day as needed for hypotension 06/18/22 06/13/23 Yes [provider]  Nutritional Supplements (FEEDING SUPPLEMENT, OSMOLITE 1.5 CAL,) LIQD Place 1,425 mLs into feeding tube daily. 10/16/22  Yes Narda Bonds, MD  omeprazole (PRILOSEC) 40 MG capsule Removed the granules from the capsule and place him in the G-tube daily. Patient taking differently: 40 mg See admin instructions. Remove the granules from the capsule (40 mg) and place in the G-tube daily 06/28/22  Yes Ronnald Nian, MD  ondansetron (ZOFRAN) 4 MG tablet Take 1 tablet (4 mg total) by mouth every 8 (eight) hours as needed for nausea or vomiting. 10/24/22  Yes Ronnald Nian, MD  Pedialyte (PEDIALYTE) SOLN Place 355 mLs into feeding tube daily.   Yes [provider]  Protein (FEEDING SUPPLEMENT, PROSOURCE TF20,) liquid Place 60 mLs into feeding tube daily. 10/16/22 11/15/22 Yes Narda Bonds, MD  Saccharomyces boulardii (  FLORASTOR PO) Take 250 mg by mouth daily as needed (GI irritation).   Yes [provider]  SYSTANE ULTRA PF 0.4-0.3 % SOLN Place 1 drop into both eyes daily as needed (for irritation).   Yes [provider]    Physical Exam: Vitals:   11/13/22 1500 11/13/22 1537 11/13/22 1545 11/13/22 1600  BP: (!) 158/89   138/88  Pulse: 81  78 73  Resp: 15  18 (!) 22  Temp:  98.2 F (36.8 C)    TempSrc:  Oral    SpO2: 91%  92% 90%   General exam: awake, alert, no acute distress HEENT: Hoarse sounding voice moist mucus membranes, hearing grossly normal  Respiratory system: Right-sided rhonchi, left side clear, no expiratory wheezes, normal respiratory effort at rest but mild conversational dyspnea, on room air with O2 sats ranging 87 to 92% during encounter Cardiovascular system: normal S1/S2, RRR, no JVD, murmurs, rubs, gallops, no pedal edema.    Gastrointestinal system: PEG tube in place , abdomen soft, nontender nondistended. Central nervous system: A&O x4. no gross focal neurologic deficits, normal speech Extremities: moves all, no edema, normal tone Skin: dry, intact, normal temperature, normal color, No rashes, lesions or ulcers Psychiatry: normal mood, congruent affect, judgement and insight appear normal   Data Reviewed:  Notable labs and imaging as outlined above in HPI  Assessment and Plan: * Acute aspiration pneumonia (HCC) Patient developed nausea vomiting after a car ride yesterday evening to look at holiday lights.  Patient had an aspiration episode apparently with vomiting.  Chest x-ray in the ED concerning for right upper lobe pneumonia. --Started on Unasyn in the ED, continue --Sputum culture  -- Continue guaifenesin, bronchodilator and other supportive care per orders  Dysphagia PEG tube dependent Chronic, secondary to history of oropharyngeal cancer status post radiation and chemotherapy. -- Osmolite 1.5 continuous tube feeds and Prosource continued --Strict n.p.o. -- Maintenance fluids given patient has gone significant amount of time off of his tube feeds while waiting in the ED  Hyperkalemia K 5.4 on admission.  Given Lokelma in the ED. Repeat BMP in AM.  Chronic diarrhea Due to tube feeds.  Takes Imodium as needed at home, typically every 2 to 3 days.  Imodium continued as needed.  GERD (gastroesophageal reflux disease) Patient takes omeprazole via PEG tube at home.  Discussed with pharmacy and they have 20 mg omeprazole capsules in stock.  Pantoprazole cannot be given per tube. -- Continue omeprazole per tube  Allergic rhinitis Continue Flonase      Advance Care Planning:   Code Status: Full Code   Consults: None  Family Communication: Wife at bedside  Severity of Illness: The appropriate patient status for this patient is OBSERVATION. Observation status is judged to be reasonable and  necessary in order to provide the required intensity of service to ensure the patient's safety. The patient's presenting symptoms, physical exam findings, and initial radiographic and laboratory data in the context of their medical condition is felt to place them at decreased risk for further clinical deterioration. Furthermore, it is anticipated that the patient will be medically stable for discharge from the hospital within 2 midnights of admission.   Author: Pennie Banter, DO 11/13/2022 5:59 PM  For on call review [email or web address].

## 2022-11-13 NOTE — Assessment & Plan Note (Addendum)
PEG tube dependent Chronic, secondary to history of oropharyngeal cancer status post radiation and chemotherapy. -- Osmolite 1.5 continuous tube feeds and Prosource continued --Strict n.p.o. -- Maintenance fluids given patient has gone significant amount of time off of his tube feeds while waiting in the ED

## 2022-11-13 NOTE — Assessment & Plan Note (Signed)
Patient developed nausea vomiting after a car ride yesterday evening to look at holiday lights.  Patient had an aspiration episode apparently with vomiting.  Chest x-ray in the ED concerning for right upper lobe pneumonia. --Started on Unasyn in the ED, continue --Sputum culture  -- Continue guaifenesin, bronchodilator and other supportive care per orders

## 2022-11-13 NOTE — ED Provider Notes (Signed)
[facility] DEPT Provider Note   CSN: [id number] Arrival date & time: 11/12/22  2118     History  Chief Complaint  Patient presents with   Emesis    Lee Barnes is a 71 y.o. male.  71 year old male with a history of oropharyngeal cancer status postchemotherapy and radiation with chronic dysphagia status post PEG tube placement and recurrent aspiration pneumonia who presents emergency department shortness of breath.  Patient was in his normal state of health until last night when he had an episode of choking.  Afterwards felt chills and was short of breath.  Wife was concerned about recurrent aspiration pneumonia so brought him into the emergency department for evaluation.  Patient denies any chest pain or abdominal pain at this time.  Does not believe he has had any fevers.  No changes in his bowel habits recently.       Home Medications Prior to Admission medications   Medication Sig Start Date End Date Taking? Authorizing Provider  albuterol (PROVENTIL) (2.5 MG/3ML) 0.083% nebulizer solution Take 3 mLs (2.5 mg total) by nebulization every 4 (four) hours as needed for wheezing. 10/15/22  Yes Mariel Aloe, MD  amoxicillin-clavulanate (AUGMENTIN) 400-57 MG/5ML suspension Place 10 mLs into feeding tube 2 (two) times daily for 5 days. 11/14/22 11/19/22 Yes Donne Hazel, MD  cetirizine (ZYRTEC) 10 MG chewable tablet Place 10 mg into feeding tube daily as needed for allergies or rhinitis.   Yes [provider]  fluticasone (FLONASE) 50 MCG/ACT nasal spray SPRAY TWO SPRAYS IN EACH NOSTRIL ONCE DAILY AS NEEDED Patient taking differently: Place 2 sprays into both nostrils at bedtime. 06/05/22  Yes Denita Lung, MD  guaiFENesin (ROBITUSSIN) 100 MG/5ML liquid Place 15 mLs into feeding tube every 6 (six) hours as needed for cough or to loosen phlegm. 10/15/22  Yes Mariel Aloe, MD  loperamide (IMODIUM) 2 MG capsule 2 mg See admin  instructions. Place 2 mg into feeding tube daily as needed for diarrhea. Usually given during a feeding.   Yes [provider]  midodrine (PROAMATINE) 2.5 MG tablet Place 2.5 mg into feeding tube See admin instructions. Place 2.5 mg into feeding tube one to three times a day as needed for hypotension 06/18/22 06/13/23 Yes [provider]  Nutritional Supplements (FEEDING SUPPLEMENT, OSMOLITE 1.5 CAL,) LIQD Place 1,425 mLs into feeding tube daily. 10/16/22  Yes Mariel Aloe, MD  omeprazole (PRILOSEC) 40 MG capsule Removed the granules from the capsule and place him in the G-tube daily. Patient taking differently: 40 mg See admin instructions. Remove the granules from the capsule (40 mg) and place in the G-tube daily 06/28/22  Yes Denita Lung, MD  ondansetron (ZOFRAN) 4 MG tablet Take 1 tablet (4 mg total) by mouth every 8 (eight) hours as needed for nausea or vomiting. 10/24/22  Yes Denita Lung, MD  Pedialyte (PEDIALYTE) SOLN Place 355 mLs into feeding tube daily.   Yes [provider]  Protein (FEEDING SUPPLEMENT, PROSOURCE TF20,) liquid Place 60 mLs into feeding tube daily. 10/16/22 11/15/22 Yes Mariel Aloe, MD  Saccharomyces boulardii (FLORASTOR PO) Take 250 mg by mouth daily as needed (GI irritation).   Yes [provider]  SYSTANE ULTRA PF 0.4-0.3 % SOLN Place 1 drop into both eyes daily as needed (for irritation).   Yes [provider]      Allergies    Codeine    Review of Systems   Review of Systems  Physical Exam Updated Vital Signs BP (!) 161/96 (BP Location: Right Arm)   Pulse 92   Temp 98.1 F (36.7 C) (Oral)   Resp 16   Ht 6' 0.01" (1.829 m)   Wt 79.3 kg   SpO2 92%   BMI 23.71 kg/m  Physical Exam Vitals and nursing note reviewed.  Constitutional:      General: He is not in acute distress.    Appearance: He is well-developed.     Comments: Not in acute distress.  Satting in the low 90s on room air.  HENT:     Head:  Normocephalic and atraumatic.     Right Ear: External ear normal.     Left Ear: External ear normal.     Nose: Nose normal.  Eyes:     Extraocular Movements: Extraocular movements intact.     Conjunctiva/sclera: Conjunctivae normal.     Pupils: Pupils are equal, round, and reactive to light.  Cardiovascular:     Rate and Rhythm: Normal rate and regular rhythm.     Heart sounds: Normal heart sounds.  Pulmonary:     Effort: Pulmonary effort is normal. No respiratory distress.     Comments: Diminished over right base Abdominal:     General: There is no distension.     Palpations: Abdomen is soft. There is no mass.     Tenderness: There is no abdominal tenderness. There is no guarding.     Comments: PEG tube in place  Musculoskeletal:     Cervical back: Normal range of motion and neck supple.     Right lower leg: No edema.     Left lower leg: No edema.  Skin:    General: Skin is warm and dry.  Neurological:     Mental Status: He is alert. Mental status is at baseline.  Psychiatric:        Mood and Affect: Mood normal.        Behavior: Behavior normal.     ED Results / Procedures / Treatments   Labs (all labs ordered are listed, but only abnormal results are displayed) Labs Reviewed  CBC WITH DIFFERENTIAL/PLATELET - Abnormal; Notable for the following components:      Result Value   WBC 14.5 (*)    Hemoglobin 12.9 (*)    Neutro Abs 13.1 (*)    Lymphs Abs 0.3 (*)    Monocytes Absolute 1.1 (*)    All other components within normal limits  COMPREHENSIVE METABOLIC PANEL - Abnormal; Notable for the following components:   Sodium 134 (*)    Potassium 5.4 (*)    Glucose, Bld 115 (*)    BUN 39 (*)    All other components within normal limits  CBC - Abnormal; Notable for the following components:   WBC 12.9 (*)    RBC 3.96 (*)    Hemoglobin 11.3 (*)    HCT 35.8 (*)    All other components within normal limits  BASIC METABOLIC PANEL - Abnormal; Notable for the following  components:   BUN 31 (*)    Calcium 8.7 (*)    All other components within normal limits  CBC - Abnormal; Notable for the following components:   RBC 3.69 (*)    Hemoglobin 10.5 (*)    HCT 33.2 (*)    All other components within normal limits  RESP PANEL BY RT-PCR (RSV, FLU A&B, COVID)  RVPGX2  EXPECTORATED SPUTUM ASSESSMENT W GRAM STAIN, RFLX TO RESP C  LIPASE, BLOOD  CREATININE, SERUM  TROPONIN I (HIGH SENSITIVITY)  TROPONIN I (HIGH SENSITIVITY)    EKG EKG Interpretation  Date/Time:  Tuesday November 12 2022 23:29:30 EST Ventricular Rate:  100 PR Interval:  159 QRS Duration: 78 QT Interval:  333 QTC Calculation: 430 R Axis:   252 Text Interpretation: Sinus tachycardia LAD, consider left anterior fascicular block Consider right ventricular hypertrophy Consider anterior infarct ST elevation, consider inferior injury Baseline wander in lead(s) I III aVL Confirmed by Gerlene Fee [phone]) on 11/12/2022 11:37:52 PM  Radiology No results found.  Procedures Procedures   Medications Ordered in ED Medications  sodium chloride 0.9 % bolus 1,000 mL (0 mLs Intravenous Stopped 11/13/22 0949)  sodium zirconium cyclosilicate (LOKELMA) packet 10 g (10 g Per Tube Given 11/13/22 0804)  piperacillin-tazobactam (ZOSYN) IVPB 3.375 g (0 g Intravenous Stopping previously hung infusion 11/13/22 0915)  ondansetron (ZOFRAN) injection 4 mg (4 mg Intravenous Given 11/13/22 0753)  midodrine (PROAMATINE) tablet 2.5 mg (2.5 mg Oral Given 11/13/22 1332)  Ampicillin-Sulbactam (UNASYN) 3 g in sodium chloride 0.9 % 100 mL IVPB (0 g Intravenous Stopped 11/13/22 1634)    ED Course/ Medical Decision Making/ A&P Clinical Course as of 11/15/22 0928  Wed Nov 13, 2022  1349 Dr Arbutus Ped to admit.  [RP]    Clinical Course User Index [RP] Fransico Meadow, MD                           Medical Decision Making Risk Prescription drug management. Decision regarding hospitalization.   Lee Barnes  is a 71 y.o. male with comorbidities that complicate the patient evaluation including oropharyngeal cancer status postchemotherapy and radiation with chronic dysphagia status post PEG tube placement and recurrent aspiration pneumonia who presents emergency department shortness of breath.    Initial Ddx:  Aspiration pneumonia, pneumonitis, MI, PE, COVID/flu  MDM:  Feel the patient likely has aspiration pneumonia or aspiration pneumonitis given his history.  Will also test for COVID and flu with the holidays being recent and he could have had sick contacts that he was exposed to.  No chest discomfort that would suggest MI.  No chest pain or leg swelling or persistent hypoxia legs would suggest pulmonary embolism.  Plan:  Labs COVID and flu Chest x-ray Zofran  ED Summary/Re-evaluation:  Patient was monitored in the emergency department and had intermittent desaturations requiring supplemental oxygen.  Had a chest x-ray that did show possible right upper lobe pneumonia.  Patient was given Zosyn for suspicion of aspiration pneumonia.  Was also given IV fluids and Lokelma for his hyperkalemia.  Patient was then admitted to medicine for observation.  This patient presents to the ED for concern of complaints listed in HPI, this involves an extensive number of treatment options, and is a complaint that carries with it a high risk of complications and morbidity. Disposition including potential need for admission considered.   Dispo: Admit to Floor  Additional history obtained from spouse Records reviewed DC Summary The following labs were independently interpreted: Chemistry and show  hyperkalemia I independently reviewed the following imaging with scope of interpretation limited to determining acute life threatening conditions related to emergency care: Chest x-ray and agree with the radiologist interpretation with the following exceptions: None I personally reviewed and interpreted cardiac  monitoring: normal sinus rhythm  I personally reviewed and interpreted the pt's EKG: see above for interpretation  I have reviewed the patients home medications and made adjustments as needed Consults:  Hospitalist  Final Clinical Impression(s) / ED Diagnoses Final diagnoses:  Aspiration pneumonia of right upper lobe, unspecified aspiration pneumonia type [facility])    Rx / DC Orders ED Discharge Orders          Ordered    amoxicillin-clavulanate (AUGMENTIN) 400-57 MG/5ML suspension  2 times daily        11/14/22 1251              Fransico Meadow, MD 11/15/22 [phone]

## 2022-11-14 ENCOUNTER — Other Ambulatory Visit (HOSPITAL_COMMUNITY): Payer: Self-pay

## 2022-11-14 DIAGNOSIS — J690 Pneumonitis due to inhalation of food and vomit: Secondary | ICD-10-CM | POA: Diagnosis not present

## 2022-11-14 LAB — BASIC METABOLIC PANEL
Anion gap: 6 (ref 5–15)
BUN: 31 mg/dL — ABNORMAL HIGH (ref 8–23)
CO2: 26 mmol/L (ref 22–32)
Calcium: 8.7 mg/dL — ABNORMAL LOW (ref 8.9–10.3)
Chloride: 105 mmol/L (ref 98–111)
Creatinine, Ser: 1 mg/dL (ref 0.61–1.24)
GFR, Estimated: >60 mL/min (ref >60)
Glucose, Bld: 82 mg/dL (ref 70–99)
Potassium: 4.6 mmol/L (ref 3.5–5.1)
Sodium: 137 mmol/L (ref 135–145)

## 2022-11-14 LAB — CBC
HCT: 33.2 % — ABNORMAL LOW (ref 39.0–52.0)
Hemoglobin: 10.5 g/dL — ABNORMAL LOW (ref 13.0–17.0)
MCH: 28.5 pg (ref 26.0–34.0)
MCHC: 31.6 g/dL (ref 30.0–36.0)
MCV: 90 fL (ref 80.0–100.0)
Platelets: 181 10*3/uL (ref 150–400)
RBC: 3.69 MIL/uL — ABNORMAL LOW (ref 4.22–5.81)
RDW: 14.9 % (ref 11.5–15.5)
WBC: 8 10*3/uL (ref 4.0–10.5)
nRBC: 0 % (ref 0.0–0.2)

## 2022-11-14 MED ORDER — AMOXICILLIN-POT CLAVULANATE 400-57 MG/5ML PO SUSR
10.0000 mL | Freq: Two times a day (BID) | ORAL | 0 refills | Status: AC
  Filled 2022-11-14: qty 100, 5d supply, fill #0

## 2022-11-14 NOTE — Progress Notes (Signed)
Transition of Care [facility]) Screening Note   Patient Details  Name: Lee Barnes Date of Birth: Jan 23, 1951   Transition of Care [facility]) CM/SW Contact:    Otelia Santee, LCSW Phone Number: 11/14/2022, 12:55 PM    Transition of Care Department [facility]) has reviewed patient and no TOC needs have been identified at this time. We will continue to monitor patient advancement through interdisciplinary progression rounds. If new patient transition needs arise, please place a TOC consult.

## 2022-11-14 NOTE — Evaluation (Signed)
Physical Therapy Evaluation Patient Details Name: Lee Barnes MRN: [id number] DOB: [birth year] Today's Date: 11/14/2022  History of Present Illness  Pt is a 71 y/o male admitted for aspiration PNA. PMH: basal cell carcinoma of L cheek, diverticulosis, childhood heart murmur, HTN, oropharyngeal cancer, tinnitis, hx of chemo and radiation, PEG tube placement.    Clinical Impression  Lee Barnes is 70 y.o. male admitted with above HPI and diagnosis. Patient is currently limited by functional impairments below (see PT problem list). Patient lives with his wife and is independent at baseline. Patient evaluated by Physical Therapy with no further acute PT needs identified. All education has been completed and the patient has no further questions. Patient is mobilizing at mod independent to supervision level. See below for any follow-up Physical Therapy or equipment needs. PT is signing off. Thank you for this referral.      Recommendations for follow up therapy are one component of a multi-disciplinary discharge planning process, led by the attending physician.  Recommendations may be updated based on patient status, additional functional criteria and insurance authorization.  Follow Up Recommendations No PT follow up      Assistance Recommended at Discharge Intermittent Supervision/Assistance  Patient can return home with the following  A little help with walking and/or transfers;A little help with bathing/dressing/bathroom;Assist for transportation;Help with stairs or ramp for entrance    Equipment Recommendations None recommended by PT  Recommendations for Other Services       Functional Status Assessment Patient has had a recent decline in their functional status and demonstrates the ability to make significant improvements in function in a reasonable and predictable amount of time.     Precautions / Restrictions Precautions Precautions: Fall Restrictions Weight Bearing  Restrictions: No      Mobility  Bed Mobility Overal bed mobility: Modified Independent Bed Mobility: Supine to Sit     Supine to sit: Modified independent (Device/Increase time)          Transfers Overall transfer level: Needs assistance Equipment used: None Transfers: Sit to/from Stand Sit to Stand: Modified independent (Device/Increase time)           General transfer comment: use of hands to rise, steady in standing    Ambulation/Gait Ambulation/Gait assistance: Supervision Gait Distance (Feet): 500 Feet Assistive device: IV Pole Gait Pattern/deviations: Step-through pattern Gait velocity: fair     General Gait Details: no overt LOB and pt steady throughout, fair pace with pt maintaining safe position of IV pole and good awareness of line management.  Stairs            Wheelchair Mobility    Modified Rankin (Stroke Patients Only)       Balance Overall balance assessment: Mild deficits observed, not formally tested                                           Pertinent Vitals/Pain Pain Assessment Pain Assessment: No/denies pain    Home Living Family/patient expects to be discharged to:: Private residence Living Arrangements: Spouse/significant other Available Help at Discharge: Family;Available 24 hours/day Type of Home: House Home Access: Stairs to enter   [employer] of Steps: 2   Home Layout: Two level;Able to live on main level with bedroom/bathroom Home Equipment: Shower seat - built in Additional Comments: pt lives with wife and is independent    Prior Function Prior Level of  Function : Independent/Modified Independent             Mobility Comments: no use of AD ADLs Comments: Independent with ADLs, wife completes iADLs. pt enjoys spending time with grandchildren. 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)  Cognition Arousal/Alertness: Awake/alert Behavior During Therapy: WFL for tasks assessed/performed Overall Cognitive Status: Within Functional Limits for tasks assessed                                          General Comments      Exercises     Assessment/Plan    PT Assessment Patient does not need any further PT services  PT Problem List Decreased activity tolerance;Decreased mobility       PT Treatment Interventions Gait training;Functional mobility training;Balance training;Therapeutic activities    PT Goals (Current goals can be found in the Care Plan section)  Acute Rehab PT Goals Patient Stated Goal: get home PT Goal Formulation: All assessment and education complete, DC therapy Time For Goal Achievement: 11/15/22 Potential to Achieve Goals: Good    Frequency  (1x eval)     Co-evaluation               AM-PAC PT "6 Clicks" Mobility  Outcome Measure Help needed turning from your back to your side while in a flat bed without using bedrails?: None Help needed moving from lying on your back to sitting on the side of a flat bed without using bedrails?: None Help needed moving to and from a bed to a chair (including a wheelchair)?: None Help needed standing up from a chair using your arms (e.g., wheelchair or bedside chair)?: None Help needed to walk in hospital room?: A Little Help needed climbing 3-5 steps with a railing? : A Little 6 Click Score: 22    End of Session   Activity Tolerance: Patient tolerated treatment well Patient left: in chair;with call bell/phone within reach Nurse Communication: Mobility status PT Visit Diagnosis:  Muscle weakness (generalized) (M62.81);Other abnormalities of gait and mobility (R26.89)    Time: 1610-9604 PT Time Calculation (min) (ACUTE ONLY): 13 min   Charges:   PT Evaluation $PT Eval Low Complexity: 1 Low          Wynn Maudlin, DPT Acute Rehabilitation Services Office [phone]  11/14/22 12:34 PM

## 2022-11-14 NOTE — Hospital Course (Signed)
71 y.o. 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 status, prior episodes of aspiration pneumonia, hypertension, GERD, chronic diarrhea related to tube feeds who presented to the ED for evaluation of shortness of breath and chills.  Patient and wife at bedside report yesterday evening, after they had been driving around to see Holiday lights and patient developed nausea and vomiting, possibly carsickness, and had an aspiration event when vomiting and heaving.  Since then, he developed worsening shortness of breath and subjective chills but no measured fever.  He denies nausea vomiting since around 3 AM in the ER, has been treated with antiemetics.  No abdominal pain, does endorse chronic diarrhea attributed to tube feeds.  Otherwise no recent illness or sick contacts.  Pt found to have RUL airspace disease concerning for aspiration PNA. Pt was admitted for further w/u

## 2022-11-14 NOTE — Progress Notes (Signed)
Dc order recd. Tele and IV dc per protocol. Pt tol well. DC instructions reviewed with pt and spouse who voice understanding. Prescription delivered recently. Pt voices no concerns or c/o, no distress noted. All belongings accompany pt. Pt travels by wheelchair to main entrance to return home via pov.

## 2022-11-14 NOTE — Discharge Summary (Signed)
Physician Discharge Summary   Patient: Lee Barnes MRN: [id number] DOB: [birth year]  Admit date:     11/12/2022  Discharge date: 11/14/22  Discharge Physician: Rickey Barbara   PCP: Ronnald Nian, MD   Recommendations at discharge:    Follow up with PCP in 1-2 weeks  Discharge Diagnoses: Principal Problem:   Acute aspiration pneumonia (HCC) Active Problems:   Dysphagia   Allergic rhinitis   GERD (gastroesophageal reflux disease)   Chronic diarrhea   Hyperkalemia  Resolved Problems:   * No resolved hospital problems. *  Hospital Course: 71 y.o. 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 status, prior episodes of aspiration pneumonia, hypertension, GERD, chronic diarrhea related to tube feeds who presented to the ED for evaluation of shortness of breath and chills.  Patient and wife at bedside report yesterday evening, after they had been driving around to see Holiday lights and patient developed nausea and vomiting, possibly carsickness, and had an aspiration event when vomiting and heaving.  Since then, he developed worsening shortness of breath and subjective chills but no measured fever.  He denies nausea vomiting since around 3 AM in the ER, has been treated with antiemetics.  No abdominal pain, does endorse chronic diarrhea attributed to tube feeds.  Otherwise no recent illness or sick contacts.  Pt found to have RUL airspace disease concerning for aspiration PNA. Pt was admitted for further w/u  Assessment and Plan: * Acute aspiration pneumonia (HCC) Patient developed nausea vomiting after a car ride yesterday evening to look at holiday lights.  Patient had an aspiration episode apparently with vomiting.  Chest x-ray in the ED concerning for right upper lobe pneumonia. --Started on Unasyn in the ED -- Clinically much improved the following  morning, on room air, ambulating without issues, eager to go home -Prescribed augmentin to complete course on d/c   Dysphagia PEG tube dependent Chronic, secondary to history of oropharyngeal cancer status post radiation and chemotherapy. -- Osmolite 1.5 continuous tube feeds and Prosource continued --Strict n.p.o.   Hyperkalemia K 5.4 on admission.  Given Lokelma in the ED. Potassium normalized   Chronic diarrhea Due to tube feeds.  Takes Imodium as needed at home, typically every 2 to 3 days.     GERD (gastroesophageal reflux disease) Cont PPI per home regimen   Allergic rhinitis Continue Flonase          Consultants:  Procedures performed:   Disposition: Home Diet recommendation:  NPO with continued tube feeding DISCHARGE MEDICATION: Allergies as of 11/14/2022       Reactions   Codeine Nausea And Vomiting        Medication List     TAKE these medications    albuterol (2.5 MG/3ML) 0.083% nebulizer solution Commonly known as: PROVENTIL Take 3 mLs (2.5 mg total) by nebulization every 4 (four) hours as needed for wheezing.   amoxicillin-clavulanate 400-57 MG/5ML suspension Commonly known as: AUGMENTIN Place 10 mLs into feeding tube 2 (two) times daily for 5 days.   cetirizine 10 MG chewable tablet Commonly known as: ZYRTEC Place 10 mg into feeding tube daily as needed for allergies or rhinitis.   feeding supplement (OSMOLITE 1.5 CAL) Liqd Place 1,425 mLs into feeding tube daily.   feeding supplement (PROSource TF20) liquid Place 60 mLs into feeding tube daily.   FLORASTOR PO Take 250 mg by mouth daily as needed (GI irritation).  fluticasone 50 MCG/ACT nasal spray Commonly known as: FLONASE SPRAY TWO SPRAYS IN EACH NOSTRIL ONCE DAILY AS NEEDED What changed:  how much to take how to take this when to take this additional instructions   guaiFENesin 100 MG/5ML liquid Commonly known as: ROBITUSSIN Place 15 mLs into feeding tube every 6 (six)  hours as needed for cough or to loosen phlegm.   loperamide 2 MG capsule Commonly known as: IMODIUM 2 mg See admin instructions. Place 2 mg into feeding tube daily as needed for diarrhea. Usually given during a feeding.   midodrine 2.5 MG tablet Commonly known as: PROAMATINE Place 2.5 mg into feeding tube See admin instructions. Place 2.5 mg into feeding tube one to three times a day as needed for hypotension   omeprazole 40 MG capsule Commonly known as: PRILOSEC Removed the granules from the capsule and place him in the G-tube daily. What changed:  how much to take when to take this additional instructions   ondansetron 4 MG tablet Commonly known as: ZOFRAN Take 1 tablet (4 mg total) by mouth every 8 (eight) hours as needed for nausea or vomiting.   Pedialyte Soln Place 355 mLs into feeding tube daily.   Systane Ultra PF 0.4-0.3 % Soln Generic drug: Polyethyl Glyc-Propyl Glyc PF Place 1 drop into both eyes daily as needed (for irritation).        Follow-up Information     Ronnald Nian, MD Follow up in 2 week(s).   Specialty: Family Medicine Why: Hospital follow up Contact information: [address] [place] [address] [phone]                Discharge Exam: Ceasar Mons Weights   11/13/22 2053 11/14/22 0451  Weight: 73.8 kg 79.3 kg   General exam: Awake, laying in bed, in nad Respiratory system: Normal respiratory effort, no wheezing Cardiovascular system: regular rate, s1, s2 Gastrointestinal system: Soft, nondistended, positive BS Central nervous system: CN2-12 grossly intact, strength intact Extremities: Perfused, no clubbing Skin: Normal skin turgor, no notable skin lesions seen Psychiatry: Mood normal // no visual hallucinations   Condition at discharge: fair  The results of significant diagnostics from this hospitalization (including imaging, microbiology, ancillary and laboratory) are listed below for reference.   Imaging  Studies: DG Chest 2 View  Result Date: 11/12/2022 CLINICAL DATA:  Cough and shortness of breath. EXAM: CHEST - 2 VIEW COMPARISON:  AP and lateral views 10/10/2022. FINDINGS: Moderate chronic elevation right hemidiaphragm is again noted consistent with eventration, volume loss or paresis. Heart size and vasculature are normal with stable mediastinum, mild calcification aortic arch. There is patchy airspace disease in the right upper lobe perihilar area most likely due to pneumonia. There are atelectatic bands in the hypoinflated right base. Remaining lung fields are clear, as visualized. Previously there was more widespread right lung airspace disease. There is no visible pleural collection.  Thoracic cage intact. IMPRESSION: 1. Patchy right upper lobe perihilar airspace disease most likely pneumonia. 2. Chronic elevation right hemidiaphragm with atelectatic bands in the right base. 3. Aortic atherosclerosis. Electronically Signed   By: Almira Bar M.D.   On: 11/12/2022 23:29    Microbiology: Results for orders placed or performed during the hospital encounter of 11/12/22  Resp panel by RT-PCR (RSV, Flu A&B, Covid) Anterior Nasal Swab     Status: None   Collection Time: 11/13/22  1:11 AM   Specimen: Anterior Nasal Swab  Result Value Ref Range Status   SARS Coronavirus 2 by  RT PCR NEGATIVE NEGATIVE Final    Comment: (NOTE) SARS-CoV-2 target nucleic acids are NOT DETECTED.  The SARS-CoV-2 RNA is generally detectable in upper respiratory specimens during the acute phase of infection. The lowest concentration of SARS-CoV-2 viral copies this assay can detect is 138 copies/mL. A negative result does not preclude SARS-Cov-2 infection and should not be used as the sole basis for treatment or other patient management decisions. A negative result may occur with  improper specimen collection/handling, submission of specimen other than nasopharyngeal swab, presence of viral mutation(s) within the areas  targeted by this assay, and inadequate number of viral copies(<138 copies/mL). A negative result must be combined with clinical observations, patient history, and epidemiological information. The expected result is Negative.  Fact Sheet for Patients:  BloggerCourse.com  Fact Sheet for Healthcare Providers:  SeriousBroker.it  This test is no t yet approved or cleared by the [place] FDA and  has been authorized for detection and/or diagnosis of SARS-CoV-2 by FDA under an Emergency Use Authorization (EUA). This EUA will remain  in effect (meaning this test can be used) for the duration of the COVID-19 declaration under Section 564(b)(1) of the Act, 21 U.S.C.section 360bbb-3(b)(1), unless the authorization is terminated  or revoked sooner.       Influenza A by PCR NEGATIVE NEGATIVE Final   Influenza B by PCR NEGATIVE NEGATIVE Final    Comment: (NOTE) The Xpert Xpress SARS-CoV-2/FLU/RSV plus assay is intended as an aid in the diagnosis of influenza from Nasopharyngeal swab specimens and should not be used as a sole basis for treatment. Nasal washings and aspirates are unacceptable for Xpert Xpress SARS-CoV-2/FLU/RSV testing.  Fact Sheet for Patients: BloggerCourse.com  Fact Sheet for Healthcare Providers: SeriousBroker.it  This test is not yet approved or cleared by the [place] FDA and has been authorized for detection and/or diagnosis of SARS-CoV-2 by FDA under an Emergency Use Authorization (EUA). This EUA will remain in effect (meaning this test can be used) for the duration of the COVID-19 declaration under Section 564(b)(1) of the Act, 21 U.S.C. section 360bbb-3(b)(1), unless the authorization is terminated or revoked.     Resp Syncytial Virus by PCR NEGATIVE NEGATIVE Final    Comment: (NOTE) Fact Sheet for  Patients: BloggerCourse.com  Fact Sheet for Healthcare Providers: SeriousBroker.it  This test is not yet approved or cleared by the [place] FDA and has been authorized for detection and/or diagnosis of SARS-CoV-2 by FDA under an Emergency Use Authorization (EUA). This EUA will remain in effect (meaning this test can be used) for the duration of the COVID-19 declaration under Section 564(b)(1) of the Act, 21 U.S.C. section 360bbb-3(b)(1), unless the authorization is terminated or revoked.  Performed at [facility], [address], [place], [place] [address]     Labs: CBC: Recent Labs  Lab 11/13/22 0020 11/13/22 1353 11/14/22 0716  WBC 14.5* 12.9* 8.0  NEUTROABS 13.1*  --   --   HGB 12.9* 11.3* 10.5*  HCT 40.2 35.8* 33.2*  MCV 89.7 90.4 90.0  PLT 226 194 181   Basic Metabolic Panel: Recent Labs  Lab 11/13/22 0021 11/13/22 1353 11/14/22 0716  NA 134*  --  137  K 5.4*  --  4.6  CL 98  --  105  CO2 25  --  26  GLUCOSE 115*  --  82  BUN 39*  --  31*  CREATININE 1.17 1.24 1.00  CALCIUM 9.3  --  8.7*   Liver Function Tests:  Recent Labs  Lab 11/13/22 0021  AST 25  ALT 18  ALKPHOS 90  BILITOT 0.6  PROT 7.8  ALBUMIN 3.9   CBG: No results for input(s): "GLUCAP" in the last 168 hours.  Discharge time spent: less than 30 minutes.  Signed: Rickey Barbara, MD [facility] 11/14/2022

## 2022-11-17 DIAGNOSIS — K219 Gastro-esophageal reflux disease without esophagitis: Secondary | ICD-10-CM | POA: Diagnosis not present

## 2022-11-17 DIAGNOSIS — E871 Hypo-osmolality and hyponatremia: Secondary | ICD-10-CM | POA: Diagnosis not present

## 2022-11-17 DIAGNOSIS — K5790 Diverticulosis of intestine, part unspecified, without perforation or abscess without bleeding: Secondary | ICD-10-CM | POA: Diagnosis not present

## 2022-11-17 DIAGNOSIS — G903 Multi-system degeneration of the autonomic nervous system: Secondary | ICD-10-CM | POA: Diagnosis not present

## 2022-11-17 DIAGNOSIS — J690 Pneumonitis due to inhalation of food and vomit: Secondary | ICD-10-CM | POA: Diagnosis not present

## 2022-11-17 DIAGNOSIS — I10 Essential (primary) hypertension: Secondary | ICD-10-CM | POA: Diagnosis not present

## 2022-11-17 DIAGNOSIS — Z85818 Personal history of malignant neoplasm of other sites of lip, oral cavity, and pharynx: Secondary | ICD-10-CM | POA: Diagnosis not present

## 2022-11-17 DIAGNOSIS — Z85828 Personal history of other malignant neoplasm of skin: Secondary | ICD-10-CM | POA: Diagnosis not present

## 2022-11-17 DIAGNOSIS — R1310 Dysphagia, unspecified: Secondary | ICD-10-CM | POA: Diagnosis not present

## 2022-11-17 DIAGNOSIS — Z431 Encounter for attention to gastrostomy: Secondary | ICD-10-CM | POA: Diagnosis not present

## 2022-11-17 DIAGNOSIS — Z9181 History of falling: Secondary | ICD-10-CM | POA: Diagnosis not present

## 2022-11-19 DIAGNOSIS — I10 Essential (primary) hypertension: Secondary | ICD-10-CM | POA: Diagnosis not present

## 2022-11-19 DIAGNOSIS — J690 Pneumonitis due to inhalation of food and vomit: Secondary | ICD-10-CM | POA: Diagnosis not present

## 2022-11-19 DIAGNOSIS — R1310 Dysphagia, unspecified: Secondary | ICD-10-CM | POA: Diagnosis not present

## 2022-11-19 DIAGNOSIS — Z431 Encounter for attention to gastrostomy: Secondary | ICD-10-CM | POA: Diagnosis not present

## 2022-11-19 DIAGNOSIS — E871 Hypo-osmolality and hyponatremia: Secondary | ICD-10-CM | POA: Diagnosis not present

## 2022-11-19 DIAGNOSIS — G903 Multi-system degeneration of the autonomic nervous system: Secondary | ICD-10-CM | POA: Diagnosis not present

## 2022-11-20 DIAGNOSIS — R490 Dysphonia: Secondary | ICD-10-CM | POA: Diagnosis not present

## 2022-11-20 DIAGNOSIS — Z85810 Personal history of malignant neoplasm of tongue: Secondary | ICD-10-CM | POA: Diagnosis not present

## 2022-11-20 DIAGNOSIS — Z9221 Personal history of antineoplastic chemotherapy: Secondary | ICD-10-CM | POA: Diagnosis not present

## 2022-11-20 DIAGNOSIS — Z923 Personal history of irradiation: Secondary | ICD-10-CM | POA: Diagnosis not present

## 2022-11-20 DIAGNOSIS — R1310 Dysphagia, unspecified: Secondary | ICD-10-CM | POA: Diagnosis not present

## 2022-11-20 DIAGNOSIS — J386 Stenosis of larynx: Secondary | ICD-10-CM | POA: Diagnosis not present

## 2022-11-20 DIAGNOSIS — C029 Malignant neoplasm of tongue, unspecified: Secondary | ICD-10-CM | POA: Diagnosis not present

## 2022-11-20 DIAGNOSIS — B379 Candidiasis, unspecified: Secondary | ICD-10-CM | POA: Diagnosis not present

## 2022-11-21 DIAGNOSIS — E871 Hypo-osmolality and hyponatremia: Secondary | ICD-10-CM | POA: Diagnosis not present

## 2022-11-21 DIAGNOSIS — J690 Pneumonitis due to inhalation of food and vomit: Secondary | ICD-10-CM | POA: Diagnosis not present

## 2022-11-21 DIAGNOSIS — G903 Multi-system degeneration of the autonomic nervous system: Secondary | ICD-10-CM | POA: Diagnosis not present

## 2022-11-21 DIAGNOSIS — R1310 Dysphagia, unspecified: Secondary | ICD-10-CM | POA: Diagnosis not present

## 2022-11-21 DIAGNOSIS — I10 Essential (primary) hypertension: Secondary | ICD-10-CM | POA: Diagnosis not present

## 2022-11-21 DIAGNOSIS — Z431 Encounter for attention to gastrostomy: Secondary | ICD-10-CM | POA: Diagnosis not present

## 2022-11-26 DIAGNOSIS — R1310 Dysphagia, unspecified: Secondary | ICD-10-CM | POA: Diagnosis not present

## 2022-11-26 DIAGNOSIS — Z431 Encounter for attention to gastrostomy: Secondary | ICD-10-CM | POA: Diagnosis not present

## 2022-11-26 DIAGNOSIS — J690 Pneumonitis due to inhalation of food and vomit: Secondary | ICD-10-CM | POA: Diagnosis not present

## 2022-11-26 DIAGNOSIS — I10 Essential (primary) hypertension: Secondary | ICD-10-CM | POA: Diagnosis not present

## 2022-11-26 DIAGNOSIS — E871 Hypo-osmolality and hyponatremia: Secondary | ICD-10-CM | POA: Diagnosis not present

## 2022-11-26 DIAGNOSIS — G903 Multi-system degeneration of the autonomic nervous system: Secondary | ICD-10-CM | POA: Diagnosis not present

## 2022-11-27 ENCOUNTER — Encounter: Payer: Self-pay | Admitting: Family Medicine

## 2022-11-27 ENCOUNTER — Ambulatory Visit (INDEPENDENT_AMBULATORY_CARE_PROVIDER_SITE_OTHER): Payer: Medicare Other | Admitting: Family Medicine

## 2022-11-27 VITALS — BP 132/88 | HR 93 | Temp 97.8°F | Resp 18 | Wt 165.0 lb

## 2022-11-27 DIAGNOSIS — Z789 Other specified health status: Secondary | ICD-10-CM | POA: Diagnosis not present

## 2022-11-27 DIAGNOSIS — Z931 Gastrostomy status: Secondary | ICD-10-CM | POA: Insufficient documentation

## 2022-11-27 DIAGNOSIS — J9601 Acute respiratory failure with hypoxia: Secondary | ICD-10-CM | POA: Diagnosis not present

## 2022-11-27 DIAGNOSIS — J690 Pneumonitis due to inhalation of food and vomit: Secondary | ICD-10-CM | POA: Diagnosis not present

## 2022-11-27 DIAGNOSIS — Z85819 Personal history of malignant neoplasm of unspecified site of lip, oral cavity, and pharynx: Secondary | ICD-10-CM | POA: Diagnosis not present

## 2022-11-27 DIAGNOSIS — E871 Hypo-osmolality and hyponatremia: Secondary | ICD-10-CM

## 2022-11-27 NOTE — Progress Notes (Signed)
   Subjective:    Patient ID: JAVAUN DIMPERIO, male    DOB: 05/14/1951, 72 y.o.   MRN: [id number]  HPI He is here for a posthospitalization visit.  He was admitted on December 26 and sent home on the 28th.  The evaluation was for difficulty with vomiting and malaise and previous history of aspiration pneumonia.  The evaluation did show questionable aspiration and he was treated with IV antibiotics and sent home on Augmentin.  He has finished that dosing.  He is on continuous G tube feeding.  He was seen by gastroenterologist with [facility] who then referred to pulmonary to get their input into anything that can be done to help with the pulmonary issues that are going on.  He has also been seen in the past by ENT.  Presently he is doing fine with no coughing, shortness of breath, malaise.  He states that he is felt the best recently that he has in quite some time.  He also has a previous history of hyponatremia and recent blood work which did show slightly elevated potassium levels.   Review of Systems     Objective:   Physical Exam Alert and in no distress.  Cardiac exam shows regular rhythm without murmurs or gallops.  Lungs showed questionable decreased breath sounds in the lower right quadrant.  Abdominal exam shows the G-tube to be in place with no erythema or abdominal tenderness.       Assessment & Plan:  Acute aspiration pneumonia ([place]) - Plan: Comprehensive metabolic panel, CBC with Differential/Platelet  Acute respiratory failure with hypoxia (HCC)  Chronic hyponatremia - Plan: Comprehensive metabolic panel  History of oropharyngeal cancer  On tube feeding diet  G tube feedings (HCC) Will also wait to hear if pulmonary has any words of wisdom for [place].

## 2022-11-28 LAB — CBC WITH DIFFERENTIAL/PLATELET
Basophils Absolute: 0 10*3/uL (ref 0.0–0.2)
Basos: 1 %
EOS (ABSOLUTE): 0.2 10*3/uL (ref 0.0–0.4)
Eos: 4 %
Hematocrit: 41.1 % (ref 37.5–51.0)
Hemoglobin: 13.2 g/dL (ref 13.0–17.7)
Immature Grans (Abs): 0.1 10*3/uL (ref 0.0–0.1)
Immature Granulocytes: 1 %
Lymphocytes Absolute: 0.6 10*3/uL — ABNORMAL LOW (ref 0.7–3.1)
Lymphs: 10 %
MCH: 28.6 pg (ref 26.6–33.0)
MCHC: 32.1 g/dL (ref 31.5–35.7)
MCV: 89 fL (ref 79–97)
Monocytes Absolute: 0.8 10*3/uL (ref 0.1–0.9)
Monocytes: 13 %
Neutrophils Absolute: 4.1 10*3/uL (ref 1.4–7.0)
Neutrophils: 71 %
Platelets: 266 10*3/uL (ref 150–450)
RBC: 4.62 x10E6/uL (ref 4.14–5.80)
RDW: 14.5 % (ref 11.6–15.4)
WBC: 5.7 10*3/uL (ref 3.4–10.8)

## 2022-11-28 LAB — COMPREHENSIVE METABOLIC PANEL
ALT: 17 IU/L (ref 0–44)
AST: 20 IU/L (ref 0–40)
Albumin/Globulin Ratio: 1.3 (ref 1.2–2.2)
Albumin: 4.4 g/dL (ref 3.8–4.8)
Alkaline Phosphatase: 135 IU/L — ABNORMAL HIGH (ref 44–121)
BUN/Creatinine Ratio: 32 — ABNORMAL HIGH (ref 10–24)
BUN: 30 mg/dL — ABNORMAL HIGH (ref 8–27)
Bilirubin Total: 0.2 mg/dL (ref 0.0–1.2)
CO2: 26 mmol/L (ref 20–29)
Calcium: 9.9 mg/dL (ref 8.6–10.2)
Chloride: 95 mmol/L — ABNORMAL LOW (ref 96–106)
Creatinine, Ser: 0.95 mg/dL (ref 0.76–1.27)
Globulin, Total: 3.3 g/dL (ref 1.5–4.5)
Glucose: 88 mg/dL (ref 70–99)
Potassium: 5.8 mmol/L — ABNORMAL HIGH (ref 3.5–5.2)
Sodium: 135 mmol/L (ref 134–144)
Total Protein: 7.7 g/dL (ref 6.0–8.5)
eGFR: 86 mL/min/{1.73_m2} (ref >59)

## 2022-12-02 DIAGNOSIS — E871 Hypo-osmolality and hyponatremia: Secondary | ICD-10-CM | POA: Diagnosis not present

## 2022-12-02 DIAGNOSIS — J690 Pneumonitis due to inhalation of food and vomit: Secondary | ICD-10-CM | POA: Diagnosis not present

## 2022-12-02 DIAGNOSIS — I10 Essential (primary) hypertension: Secondary | ICD-10-CM | POA: Diagnosis not present

## 2022-12-02 DIAGNOSIS — R1310 Dysphagia, unspecified: Secondary | ICD-10-CM | POA: Diagnosis not present

## 2022-12-02 DIAGNOSIS — Z431 Encounter for attention to gastrostomy: Secondary | ICD-10-CM | POA: Diagnosis not present

## 2022-12-02 DIAGNOSIS — G903 Multi-system degeneration of the autonomic nervous system: Secondary | ICD-10-CM | POA: Diagnosis not present

## 2022-12-10 DIAGNOSIS — T17908D Unspecified foreign body in respiratory tract, part unspecified causing other injury, subsequent encounter: Secondary | ICD-10-CM | POA: Diagnosis not present

## 2022-12-10 DIAGNOSIS — J984 Other disorders of lung: Secondary | ICD-10-CM | POA: Diagnosis not present

## 2022-12-11 ENCOUNTER — Inpatient Hospital Stay (HOSPITAL_COMMUNITY)
Admission: EM | Admit: 2022-12-11 | Discharge: 2022-12-13 | DRG: 871 | Disposition: A | Discharge status: Home / Self Care | Payer: Medicare Other | Attending: Internal Medicine | Admitting: Internal Medicine

## 2022-12-11 ENCOUNTER — Other Ambulatory Visit: Payer: Self-pay

## 2022-12-11 ENCOUNTER — Emergency Department (HOSPITAL_COMMUNITY): Payer: Medicare Other

## 2022-12-11 DIAGNOSIS — Z9221 Personal history of antineoplastic chemotherapy: Secondary | ICD-10-CM

## 2022-12-11 DIAGNOSIS — R0602 Shortness of breath: Secondary | ICD-10-CM | POA: Diagnosis not present

## 2022-12-11 DIAGNOSIS — J690 Pneumonitis due to inhalation of food and vomit: Secondary | ICD-10-CM | POA: Diagnosis present

## 2022-12-11 DIAGNOSIS — Z85828 Personal history of other malignant neoplasm of skin: Secondary | ICD-10-CM | POA: Diagnosis not present

## 2022-12-11 DIAGNOSIS — Z85818 Personal history of malignant neoplasm of other sites of lip, oral cavity, and pharynx: Secondary | ICD-10-CM | POA: Diagnosis not present

## 2022-12-11 DIAGNOSIS — Z923 Personal history of irradiation: Secondary | ICD-10-CM | POA: Diagnosis not present

## 2022-12-11 DIAGNOSIS — I10 Essential (primary) hypertension: Secondary | ICD-10-CM | POA: Diagnosis present

## 2022-12-11 DIAGNOSIS — A419 Sepsis, unspecified organism: Secondary | ICD-10-CM | POA: Diagnosis not present

## 2022-12-11 DIAGNOSIS — R1310 Dysphagia, unspecified: Secondary | ICD-10-CM | POA: Diagnosis present

## 2022-12-11 DIAGNOSIS — E871 Hypo-osmolality and hyponatremia: Secondary | ICD-10-CM | POA: Diagnosis present

## 2022-12-11 DIAGNOSIS — Z8249 Family history of ischemic heart disease and other diseases of the circulatory system: Secondary | ICD-10-CM | POA: Diagnosis not present

## 2022-12-11 DIAGNOSIS — R0902 Hypoxemia: Secondary | ICD-10-CM | POA: Diagnosis not present

## 2022-12-11 DIAGNOSIS — K529 Noninfective gastroenteritis and colitis, unspecified: Secondary | ICD-10-CM | POA: Diagnosis not present

## 2022-12-11 DIAGNOSIS — J80 Acute respiratory distress syndrome: Secondary | ICD-10-CM | POA: Diagnosis not present

## 2022-12-11 DIAGNOSIS — I444 Left anterior fascicular block: Secondary | ICD-10-CM | POA: Diagnosis not present

## 2022-12-11 DIAGNOSIS — Z743 Need for continuous supervision: Secondary | ICD-10-CM | POA: Diagnosis not present

## 2022-12-11 DIAGNOSIS — Z1152 Encounter for screening for COVID-19: Secondary | ICD-10-CM | POA: Diagnosis not present

## 2022-12-11 DIAGNOSIS — Z79899 Other long term (current) drug therapy: Secondary | ICD-10-CM

## 2022-12-11 DIAGNOSIS — R6520 Severe sepsis without septic shock: Secondary | ICD-10-CM | POA: Diagnosis present

## 2022-12-11 DIAGNOSIS — Z85819 Personal history of malignant neoplasm of unspecified site of lip, oral cavity, and pharynx: Secondary | ICD-10-CM | POA: Diagnosis present

## 2022-12-11 DIAGNOSIS — Z808 Family history of malignant neoplasm of other organs or systems: Secondary | ICD-10-CM | POA: Diagnosis not present

## 2022-12-11 DIAGNOSIS — Z885 Allergy status to narcotic agent status: Secondary | ICD-10-CM

## 2022-12-11 DIAGNOSIS — J9601 Acute respiratory failure with hypoxia: Secondary | ICD-10-CM | POA: Diagnosis present

## 2022-12-11 DIAGNOSIS — Z931 Gastrostomy status: Secondary | ICD-10-CM | POA: Diagnosis not present

## 2022-12-11 DIAGNOSIS — E43 Unspecified severe protein-calorie malnutrition: Secondary | ICD-10-CM

## 2022-12-11 DIAGNOSIS — R069 Unspecified abnormalities of breathing: Secondary | ICD-10-CM | POA: Diagnosis not present

## 2022-12-11 DIAGNOSIS — E872 Acidosis, unspecified: Secondary | ICD-10-CM | POA: Diagnosis present

## 2022-12-11 DIAGNOSIS — R000 Tachycardia, unspecified: Secondary | ICD-10-CM | POA: Diagnosis not present

## 2022-12-11 LAB — CBC WITH DIFFERENTIAL/PLATELET
Abs Immature Granulocytes: 0.03 10*3/uL (ref 0.00–0.07)
Basophils Absolute: 0 10*3/uL (ref 0.0–0.1)
Basophils Relative: 0 %
Eosinophils Absolute: 0.1 10*3/uL (ref 0.0–0.5)
Eosinophils Relative: 1 %
HCT: 42.1 % (ref 39.0–52.0)
Hemoglobin: 13.5 g/dL (ref 13.0–17.0)
Immature Granulocytes: 0 %
Lymphocytes Relative: 3 %
Lymphs Abs: 0.3 10*3/uL — ABNORMAL LOW (ref 0.7–4.0)
MCH: 28.5 pg (ref 26.0–34.0)
MCHC: 32.1 g/dL (ref 30.0–36.0)
MCV: 88.8 fL (ref 80.0–100.0)
Monocytes Absolute: 0.4 10*3/uL (ref 0.1–1.0)
Monocytes Relative: 3 %
Neutro Abs: 10.3 10*3/uL — ABNORMAL HIGH (ref 1.7–7.7)
Neutrophils Relative %: 93 %
Platelets: 255 10*3/uL (ref 150–400)
RBC: 4.74 MIL/uL (ref 4.22–5.81)
RDW: 15 % (ref 11.5–15.5)
WBC: 11 10*3/uL — ABNORMAL HIGH (ref 4.0–10.5)
nRBC: 0 % (ref 0.0–0.2)

## 2022-12-11 LAB — COMPREHENSIVE METABOLIC PANEL
ALT: 23 U/L (ref 0–44)
AST: 35 U/L (ref 15–41)
Albumin: 3.9 g/dL (ref 3.5–5.0)
Alkaline Phosphatase: 102 U/L (ref 38–126)
Anion gap: 9 (ref 5–15)
BUN: 47 mg/dL — ABNORMAL HIGH (ref 8–23)
CO2: 26 mmol/L (ref 22–32)
Calcium: 9.3 mg/dL (ref 8.9–10.3)
Chloride: 103 mmol/L (ref 98–111)
Creatinine, Ser: 1.13 mg/dL (ref 0.61–1.24)
GFR, Estimated: >60 mL/min (ref >60)
Glucose, Bld: 104 mg/dL — ABNORMAL HIGH (ref 70–99)
Potassium: 4.8 mmol/L (ref 3.5–5.1)
Sodium: 138 mmol/L (ref 135–145)
Total Bilirubin: 0.4 mg/dL (ref 0.3–1.2)
Total Protein: 8.5 g/dL — ABNORMAL HIGH (ref 6.5–8.1)

## 2022-12-11 LAB — URINALYSIS, ROUTINE W REFLEX MICROSCOPIC
Bilirubin Urine: NEGATIVE
Glucose, UA: NEGATIVE mg/dL
Hgb urine dipstick: NEGATIVE
Ketones, ur: NEGATIVE mg/dL
Leukocytes,Ua: NEGATIVE
Nitrite: NEGATIVE
Protein, ur: NEGATIVE mg/dL
Specific Gravity, Urine: 1.02 (ref 1.005–1.030)
pH: 7 (ref 5.0–8.0)

## 2022-12-11 LAB — APTT: aPTT: 25 seconds (ref 24–36)

## 2022-12-11 LAB — RESP PANEL BY RT-PCR (RSV, FLU A&B, COVID)  RVPGX2
Influenza A by PCR: NEGATIVE
Influenza B by PCR: NEGATIVE
Resp Syncytial Virus by PCR: NEGATIVE
SARS Coronavirus 2 by RT PCR: NEGATIVE

## 2022-12-11 LAB — LACTIC ACID, PLASMA
Lactic Acid, Venous: 2.9 mmol/L (ref 0.5–1.9)
Lactic Acid, Venous: 4.3 mmol/L (ref 0.5–1.9)
Lactic Acid, Venous: 2.6 mmol/L (ref 0.5–1.9)

## 2022-12-11 LAB — TROPONIN I (HIGH SENSITIVITY)
Troponin I (High Sensitivity): 9 ng/L (ref <18)
Troponin I (High Sensitivity): 16 ng/L (ref <18)

## 2022-12-11 LAB — PROTIME-INR
INR: 1.1 (ref 0.8–1.2)
Prothrombin Time: 13.6 seconds (ref 11.4–15.2)

## 2022-12-11 MED ORDER — LACTATED RINGERS IV BOLUS
1000.0000 mL | Freq: Once | INTRAVENOUS | Status: AC
  Administered 2022-12-11: 1000 mL via INTRAVENOUS

## 2022-12-11 MED ORDER — VANCOMYCIN HCL IN DEXTROSE 1-5 GM/200ML-% IV SOLN: 1000.0000 mg | Freq: Once | INTRAVENOUS | Status: DC

## 2022-12-11 MED ORDER — GUAIFENESIN 100 MG/5ML PO LIQD
15.0000 mL | Freq: Four times a day (QID) | ORAL | Status: DC | PRN
  Administered 2022-12-12: 15 mL
  Filled 2022-12-11: qty 20

## 2022-12-11 MED ORDER — ENOXAPARIN SODIUM 40 MG/0.4ML IJ SOSY
40.0000 mg | PREFILLED_SYRINGE | INTRAMUSCULAR | Status: DC
  Administered 2022-12-11: 40 mg via SUBCUTANEOUS
  Filled 2022-12-11: qty 0.4

## 2022-12-11 MED ORDER — SACCHAROMYCES BOULARDII 250 MG PO CAPS: 250.0000 mg | ORAL_CAPSULE | Freq: Every day | ORAL | Status: DC | PRN

## 2022-12-11 MED ORDER — LOPERAMIDE HCL 1 MG/7.5ML PO SUSP
2.0000 mg | ORAL | Status: DC | PRN
  Administered 2022-12-13: 2 mg
  Filled 2022-12-11 (×2): qty 15

## 2022-12-11 MED ORDER — POLYVINYL ALCOHOL 1.4 % OP SOLN: 1.0000 [drp] | Freq: Every day | OPHTHALMIC | Status: DC | PRN

## 2022-12-11 MED ORDER — LACTATED RINGERS IV SOLN: INTRAVENOUS | Status: AC

## 2022-12-11 MED ORDER — PANTOPRAZOLE SODIUM 40 MG PO TBEC: 40.0000 mg | DELAYED_RELEASE_TABLET | Freq: Every day | ORAL | Status: DC

## 2022-12-11 MED ORDER — LACTATED RINGERS IV BOLUS (SEPSIS)
1000.0000 mL | Freq: Once | INTRAVENOUS | Status: AC
  Administered 2022-12-11: 1000 mL via INTRAVENOUS

## 2022-12-11 MED ORDER — SODIUM CHLORIDE 0.9 % IV SOLN
2.0000 g | Freq: Once | INTRAVENOUS | Status: AC
  Administered 2022-12-11: 2 g via INTRAVENOUS
  Filled 2022-12-11: qty 12.5

## 2022-12-11 MED ORDER — SODIUM CHLORIDE 0.9 % IV SOLN: 500.0000 mg | INTRAVENOUS | Status: DC

## 2022-12-11 MED ORDER — METRONIDAZOLE 500 MG/100ML IV SOLN
500.0000 mg | Freq: Once | INTRAVENOUS | Status: AC
  Administered 2022-12-11: 500 mg via INTRAVENOUS
  Filled 2022-12-11: qty 100

## 2022-12-11 MED ORDER — FLUTICASONE PROPIONATE 50 MCG/ACT NA SUSP
2.0000 | Freq: Every day | NASAL | Status: DC | PRN
  Administered 2022-12-12: 2 via NASAL
  Filled 2022-12-11: qty 16

## 2022-12-11 MED ORDER — OSMOLITE 1.5 CAL PO LIQD: 1425.0000 mL | ORAL | Status: DC

## 2022-12-11 MED ORDER — LORATADINE 10 MG PO TABS
10.0000 mg | ORAL_TABLET | Freq: Every day | ORAL | Status: DC
  Administered 2022-12-12 – 2022-12-13 (×2): 10 mg via ORAL
  Filled 2022-12-11 (×2): qty 1

## 2022-12-11 MED ORDER — SODIUM CHLORIDE 0.9 % IV SOLN
3.0000 g | Freq: Four times a day (QID) | INTRAVENOUS | Status: DC
  Administered 2022-12-11 – 2022-12-13 (×8): 3 g via INTRAVENOUS
  Filled 2022-12-11 (×8): qty 8

## 2022-12-11 MED ORDER — FAMOTIDINE 40 MG/5ML PO SUSR
20.0000 mg | Freq: Two times a day (BID) | ORAL | Status: DC
  Administered 2022-12-12 – 2022-12-13 (×2): 20 mg
  Filled 2022-12-11 (×4): qty 2.5

## 2022-12-11 MED ORDER — VANCOMYCIN HCL 1500 MG/300ML IV SOLN
1500.0000 mg | INTRAVENOUS | Status: AC
  Administered 2022-12-11: 1500 mg via INTRAVENOUS
  Filled 2022-12-11: qty 300

## 2022-12-11 MED ORDER — ALBUTEROL SULFATE (2.5 MG/3ML) 0.083% IN NEBU: 2.5000 mg | INHALATION_SOLUTION | RESPIRATORY_TRACT | Status: DC | PRN

## 2022-12-11 MED ORDER — ONDANSETRON HCL 4 MG PO TABS
4.0000 mg | ORAL_TABLET | Freq: Three times a day (TID) | ORAL | Status: DC | PRN
  Administered 2022-12-12: 4 mg
  Filled 2022-12-11: qty 1

## 2022-12-11 MED ORDER — SODIUM CHLORIDE 0.9 % IV SOLN: 2.0000 g | INTRAVENOUS | Status: DC

## 2022-12-11 NOTE — ED Notes (Signed)
Pt notified of need for urine for UA.

## 2022-12-11 NOTE — ED Provider Notes (Addendum)
[place] EMERGENCY DEPARTMENT AT [facility] Provider Note   CSN: [id number] Arrival date & time: 12/11/22  0818     History  Chief Complaint  Patient presents with   Shortness of Breath    Lee Barnes is a 72 y.o. male.  Patient with oral squamous cell cancer status postradiation, PEG tube, episodes of recurrent aspiration here with difficulty breathing and tachycardia since last night.  Parent states he went to bed feeling well.  Was actually seen by pulmonology yesterday given his history of recurrent aspiration.  He had a bout of coughing and gagging this morning and believes he vomited and aspirated again.  Feels short of breath and was 84% on room air for EMS.  They placed him on 5 L of oxygen with crease to his O2 saturation to 92%.  Tachycardic to the 130s.  No known fever.  No chest pain.  No abdominal pain.  Tolerating feeds well.  No leg pain or leg swelling.  Denies any history of COPD, CHF or PE.  The history is provided by the patient and the EMS personnel. The history is limited by the condition of the patient.  Shortness of Breath Associated symptoms: no abdominal pain, no chest pain, no fever, no headaches, no neck pain, no rash and no vomiting        Home Medications Prior to Admission medications   Medication Sig Start Date End Date Taking? Authorizing Provider  albuterol (PROVENTIL) (2.5 MG/3ML) 0.083% nebulizer solution Take 3 mLs (2.5 mg total) by nebulization every 4 (four) hours as needed for wheezing. Patient not taking: Reported on 11/27/2022 10/15/22   Mariel Aloe, MD  cetirizine (ZYRTEC) 10 MG chewable tablet Place 10 mg into feeding tube daily as needed for allergies or rhinitis.    [provider]  fluticasone (FLONASE) 50 MCG/ACT nasal spray SPRAY TWO SPRAYS IN EACH NOSTRIL ONCE DAILY AS NEEDED Patient taking differently: Place 2 sprays into both nostrils at bedtime. 06/05/22   Denita Lung, MD  guaiFENesin (ROBITUSSIN)  100 MG/5ML liquid Place 15 mLs into feeding tube every 6 (six) hours as needed for cough or to loosen phlegm. 10/15/22   Mariel Aloe, MD  loperamide (IMODIUM) 2 MG capsule 2 mg See admin instructions. Place 2 mg into feeding tube daily as needed for diarrhea. Usually given during a feeding.    [provider]  midodrine (PROAMATINE) 2.5 MG tablet Place 2.5 mg into feeding tube See admin instructions. Place 2.5 mg into feeding tube one to three times a day as needed for hypotension Patient not taking: Reported on 11/27/2022 06/18/22 06/13/23  [provider]  Nutritional Supplements (FEEDING SUPPLEMENT, OSMOLITE 1.5 CAL,) LIQD Place 1,425 mLs into feeding tube daily. 10/16/22   Mariel Aloe, MD  omeprazole (PRILOSEC) 40 MG capsule Removed the granules from the capsule and place him in the G-tube daily. Patient taking differently: 40 mg See admin instructions. Remove the granules from the capsule (40 mg) and place in the G-tube daily 06/28/22   Denita Lung, MD  ondansetron (ZOFRAN) 4 MG tablet Take 1 tablet (4 mg total) by mouth every 8 (eight) hours as needed for nausea or vomiting. 10/24/22   Denita Lung, MD  Pedialyte (PEDIALYTE) SOLN Place 355 mLs into feeding tube daily.    [provider]  Saccharomyces boulardii (FLORASTOR PO) Take 250 mg by mouth daily as needed (GI irritation).    [provider]  Merri Brunette PF  0.4-0.3 % SOLN Place 1 drop into both eyes daily as needed (for irritation).    [provider]      Allergies    Codeine    Review of Systems   Review of Systems  Constitutional:  Negative for activity change, appetite change and fever.  HENT:  Negative for congestion and rhinorrhea.   Respiratory:  Positive for shortness of breath.   Cardiovascular:  Negative for chest pain.  Gastrointestinal:  Negative for abdominal pain, nausea and vomiting.  Genitourinary:  Negative for dysuria and hematuria.  Musculoskeletal:   Negative for arthralgias, myalgias and neck pain.  Skin:  Negative for rash.  Neurological:  Negative for weakness and headaches.   all other systems are negative except as noted in the HPI and PMH.    Physical Exam Updated Vital Signs BP 115/82   Pulse (!) 133   Temp 99.6 F (37.6 C) (Oral)   Resp (!) 21   Ht 6' (1.829 m)   Wt 74 kg   SpO2 92%   BMI 22.13 kg/m  Physical Exam Vitals and nursing note reviewed.  Constitutional:      General: He is in acute distress.     Appearance: He is well-developed. He is ill-appearing.  HENT:     Head: Normocephalic and atraumatic.     Mouth/Throat:     Pharynx: No oropharyngeal exudate.  Eyes:     Conjunctiva/sclera: Conjunctivae normal.     Pupils: Pupils are equal, round, and reactive to light.  Neck:     Comments: No meningismus. Cardiovascular:     Rate and Rhythm: Regular rhythm. Tachycardia present.     Heart sounds: Normal heart sounds. No murmur heard. Pulmonary:     Effort: Pulmonary effort is normal. No respiratory distress.     Breath sounds: Rhonchi present.     Comments: Slightly dyspneic with conversation, rhonchi throughout Abdominal:     Palpations: Abdomen is soft.     Tenderness: There is no abdominal tenderness. There is no guarding or rebound.  Musculoskeletal:        General: No tenderness. Normal range of motion.     Cervical back: Normal range of motion and neck supple.  Skin:    General: Skin is warm.  Neurological:     Mental Status: He is alert and oriented to person, place, and time.     Cranial Nerves: No cranial nerve deficit.     Motor: No abnormal muscle tone.     Coordination: Coordination normal.     Comments:  5/5 strength throughout. CN 2-12 intact.Equal grip strength.   Psychiatric:        Behavior: Behavior normal.     ED Results / Procedures / Treatments   Labs (all labs ordered are listed, but only abnormal results are displayed) Labs Reviewed  LACTIC ACID, PLASMA - Abnormal;  Notable for the following components:      Result Value   Lactic Acid, Venous 2.9 (*)    All other components within normal limits  COMPREHENSIVE METABOLIC PANEL - Abnormal; Notable for the following components:   Glucose, Bld 104 (*)    BUN 47 (*)    Total Protein 8.5 (*)    All other components within normal limits  CBC WITH DIFFERENTIAL/PLATELET - Abnormal; Notable for the following components:   WBC 11.0 (*)    Neutro Abs 10.3 (*)    Lymphs Abs 0.3 (*)    All other components within normal limits  RESP  PANEL BY RT-PCR (RSV, FLU A&B, COVID)  [id number]  CULTURE, BLOOD (ROUTINE X 2)  CULTURE, BLOOD (ROUTINE X 2)  URINE CULTURE  PROTIME-INR  APTT  LACTIC ACID, PLASMA  URINALYSIS, ROUTINE W REFLEX MICROSCOPIC  TROPONIN I (HIGH SENSITIVITY)  TROPONIN I (HIGH SENSITIVITY)    EKG EKG Interpretation  Date/Time:  Wednesday December 11 2022 09:19:04 EST Ventricular Rate:  126 PR Interval:  157 QRS Duration: 83 QT Interval:  303 QTC Calculation: 439 R Axis:   -69 Text Interpretation: Sinus tachycardia LAD, consider left anterior fascicular block Consider anterolateral infarct ST elevation, consider inferior injury No significant change was found Confirmed by Ezequiel Essex [id number]) on 12/11/2022 9:37:02 AM  Radiology DG Chest Port 1 View  Result Date: 12/11/2022 CLINICAL DATA:  Sepsis, shortness of breath EXAM: PORTABLE CHEST 1 VIEW COMPARISON:  11/12/2022 FINDINGS: Stable heart size. Chronic elevation of the right hemidiaphragm. Dense airspace consolidation within the periphery of the left mid lung. Additional airspace opacity at the right lung base. No pleural effusion or pneumothorax. IMPRESSION: Airspace consolidations within the left mid lung and right lung base, most compatible with multifocal pneumonia. Electronically Signed   By: Davina Poke D.O.   On: 12/11/2022 09:08    Procedures .Critical Care  Performed by: Ezequiel Essex, MD Authorized by: Ezequiel Essex, MD    Critical care provider statement:    Critical care time (minutes):  45   Critical care time was exclusive of:  Separately billable procedures and treating other patients   Critical care was necessary to treat or prevent imminent or life-threatening deterioration of the following conditions:  Sepsis and respiratory failure   Critical care was time spent personally by me on the following activities:  Development of treatment plan with patient or surrogate, discussions with consultants, evaluation of patient's response to treatment, examination of patient, ordering and review of laboratory studies, ordering and review of radiographic studies, ordering and performing treatments and interventions, pulse oximetry, re-evaluation of patient's condition, review of old charts and blood draw for specimens   I assumed direction of critical care for this patient from another provider in my specialty: no     Care discussed with: admitting provider       Medications Ordered in ED Medications  lactated ringers infusion (has no administration in time range)  lactated ringers bolus 1,000 mL (has no administration in time range)  vancomycin (VANCOCIN) IVPB 1000 mg/200 mL premix (has no administration in time range)  ceFEPIme (MAXIPIME) 2 g in sodium chloride 0.9 % 100 mL IVPB (has no administration in time range)  metroNIDAZOLE (FLAGYL) IVPB 500 mg (has no administration in time range)    ED Course/ Medical Decision Making/ A&P                             Medical Decision Making Amount and/or Complexity of Data Reviewed Labs: ordered. Decision-making details documented in ED Course. Radiology: ordered and independent interpretation performed. Decision-making details documented in ED Course. ECG/medicine tests: ordered and independent interpretation performed. Decision-making details documented in ED Course.  Risk Prescription drug management. Decision regarding hospitalization.   Concern for  recurrent aspiration pneumonia.  Patient hypoxic and tachycardic after coughing and gagging.  On arrival he is tachycardic and hypoxic.  Code sepsis was activated.  Patient given broad-spectrum antibiotics after cultures are obtained.  Chest x-ray does show left-sided infiltrate consistent with multifocal pneumonia concern for aspiration.  Patient does have acute hypoxic  respiratory failure requiring oxygen.  He is tachycardic and tachypneic.  He is given IV fluid and broad-spectrum antibiotics after cultures are obtained. Lactate 2.9.  White count 11  Tachycardia improving with IV fluids. Does have some subtle ST elevation on EKG inferiorly but this appears to be stable compared to previous.  He denies any chest pain. D/w Dr. Burt Knack of interventional cardiology who agrees similar to previous and does not meet STEMI criteria. Troponin is negative.   Admission d/w Dr. Zigmund Daniel       Final Clinical Impression(s) / ED Diagnoses Final diagnoses:  Sepsis with acute hypoxic respiratory failure without septic shock, due to unspecified organism [facility])  Aspiration pneumonia of left lower lobe, unspecified aspiration pneumonia type [facility])    Rx / DC Orders ED Discharge Orders     None         Ezequiel Essex, MD 12/11/22 1037    Ezequiel Essex, MD 12/11/22 1038

## 2022-12-11 NOTE — Sepsis Progress Note (Signed)
Notified provider of need to order repeat lactic acid. ° °

## 2022-12-11 NOTE — ED Triage Notes (Signed)
Pt BIB EMS from home c/o shortness of breath that started at 0500 this morning. Pt denies pain, recent pneumonia. Pt 86% on room air on arrival, placed on 4L [place]

## 2022-12-11 NOTE — ED Notes (Signed)
ED TO INPATIENT HANDOFF REPORT  ED Nurse Name and Phone #: Tonette Bihari [phone]   S Name/Age/Gender Lee Barnes 72 y.o. male Room/Bed: WA14/WA14  Code Status   Code Status: Full Code  Home/SNF/Other Home Patient oriented to: self, place, time, and situation Is this baseline? Yes   Triage Complete: Triage complete  Chief Complaint SOB (shortness of breath) [R06.02]  Triage Note Pt BIB EMS from home c/o shortness of breath that started at 0500 this morning. Pt denies pain, recent pneumonia. Pt 86% on room air on arrival, placed on 4L [place]   Allergies Allergies  Allergen Reactions   Codeine Nausea And Vomiting    Level of Care/Admitting Diagnosis ED Disposition     ED Disposition  Admit   Condition  --   [place]: [place] [[id number]]  Level of Care: Progressive [102]  Admit to Progressive based on following criteria: MULTISYSTEM THREATS such as stable sepsis, metabolic/electrolyte imbalance with or without encephalopathy that is responding to early treatment.  May admit patient to [facility] or [facility] if equivalent level of care is available:: Yes  Covid Evaluation: Confirmed COVID Negative  Diagnosis: SOB (shortness of breath) [062694]  Admitting Physician: Georgette Shell [[id number]]  Attending Physician: Georgette Shell [[id number]]  Certification:: I certify this patient will need inpatient services for at least 2 midnights          B Medical/Surgery History Past Medical History:  Diagnosis Date   Allergy to environmental factors    Basal cell carcinoma of left cheek 1/82/9937   Complication of anesthesia    "vageled" after surgery -overnite stay   Diverticulosis 2008   Heart murmur    hx of in childhood    History of chemotherapy    History of radiation therapy 11/05/10-12/26/10   r base tongue, 7000 cGy 35 sessions   Hypertension    Medication started in May 2016.   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   Tinnitus    Past Surgical History:  Procedure Laterality Date   APPENDECTOMY     COLONOSCOPY     INGUINAL HERNIA REPAIR Bilateral 03/30/2015   Procedure: LAPAROSCOPIC BILATERAL INGUINAL HERNIA REPAIR WITH MESH;  Surgeon: Coralie Keens, MD;  Location: [facility];  Service: General;  Laterality: Bilateral;   INGUINAL HERNIA REPAIR Right 11/18/1958   INGUINAL HERNIA REPAIR Bilateral 03/30/2015   INGUINAL HERNIA REPAIR Left 07/11/2015   INGUINAL HERNIA REPAIR N/A 07/11/2015   Procedure: REPAIR OF LEFT INGUINAL HERNIA WITH MESH;  Surgeon: Coralie Keens, MD;  Location: [facility];  Service: General;  Laterality: N/A;   INSERTION OF MESH N/A 07/11/2015   Procedure: INSERTION OF MESH;  Surgeon: Coralie Keens, MD;  Location: [place] [place];  Service: General;  Laterality: N/A;   IR [place] W/FLUORO  12/03/2021   LUMBAR [place] SURGERY     PEG PLACEMENT N/A 03/22/2022   Procedure: PERCUTANEOUS ENDOSCOPIC GASTROSTOMY (PEG) REPLACEMENT;  Surgeon: Carol Ada, MD;  Location: [facility];  Service: Gastroenterology;  Laterality: N/A;   SAVORY DILATION N/A 03/22/2022   Procedure: SAVORY DILATION;  Surgeon: Carol Ada, MD;  Location: [facility];  Service: Gastroenterology;  Laterality: N/A;   SKIN BIOPSY Left 06/24/2022   basal cell carcinoma face cheek   TONSILLECTOMY       A IV Location/Drains/Wounds Patient Lines/Drains/Airways Status  Active Line/Drains/Airways     Name Placement date Placement time Site Days   Peripheral IV 12/11/22 Left Antecubital 12/11/22  0849  Antecubital  less than 1   Peripheral IV 12/11/22 20 G Left;Posterior Hand 12/11/22  0850  Hand  less than 1   Gastrostomy/Enterostomy Percutaneous endoscopic gastrostomy (PEG) 22 Fr. 03/22/22  1509  --  264            Intake/Output Last 24 hours No intake or output data in the 24 hours ending 12/11/22  2219  Labs/Imaging Results for orders placed or performed during the hospital encounter of 12/11/22 (from the past 48 hour(s))  Lactic acid, plasma     Status: Abnormal   Collection Time: 12/11/22  8:37 AM  Result Value Ref Range   Lactic Acid, Venous 2.9 (HH) 0.5 - 1.9 mmol/L    Comment: CRITICAL RESULT CALLED TO, READ BACK BY AND VERIFIED WITH CORTES,C. RN AT [phone] 12/11/22 MULLINS,T Performed at [facility], [place] [address], [place], [place] [address]   Comprehensive metabolic panel     Status: Abnormal   Collection Time: 12/11/22  8:37 AM  Result Value Ref Range   Sodium 138 135 - 145 mmol/L   Potassium 4.8 3.5 - 5.1 mmol/L   Chloride 103 98 - 111 mmol/L   CO2 26 22 - 32 mmol/L   Glucose, Bld 104 (H) 70 - 99 mg/dL    Comment: Glucose reference range applies only to samples taken after fasting for at least 8 hours.   BUN 47 (H) 8 - 23 mg/dL   Creatinine, Ser 1.13 0.61 - 1.24 mg/dL   Calcium 9.3 8.9 - 10.3 mg/dL   Total Protein 8.5 (H) 6.5 - 8.1 g/dL   Albumin 3.9 3.5 - 5.0 g/dL   AST 35 15 - 41 U/L   ALT 23 0 - 44 U/L   Alkaline Phosphatase 102 38 - 126 U/L   Total Bilirubin 0.4 0.3 - 1.2 mg/dL   GFR, Estimated >60 >60 mL/min    Comment: (NOTE) Calculated using the CKD-EPI Creatinine Equation (2021)    Anion gap 9 5 - 15    Comment: Performed at [facility], [place] [address], [place],  [address]  CBC with Differential     Status: Abnormal   Collection Time: 12/11/22  8:37 AM  Result Value Ref Range   WBC 11.0 (H) 4.0 - 10.5 K/uL   RBC 4.74 4.22 - 5.81 MIL/uL   Hemoglobin 13.5 13.0 - 17.0 g/dL   HCT 42.1 39.0 - 52.0 %   MCV 88.8 80.0 - 100.0 fL   MCH 28.5 26.0 - 34.0 pg   MCHC 32.1 30.0 - 36.0 g/dL   RDW 15.0 11.5 - 15.5 %   Platelets 255 150 - 400 K/uL   nRBC 0.0 0.0 - 0.2 %   Neutrophils Relative % 93 %   Neutro Abs 10.3 (H) 1.7 - 7.7 K/uL   Lymphocytes Relative 3 %   Lymphs Abs 0.3 (L) 0.7 - 4.0 K/uL   Monocytes  Relative 3 %   Monocytes Absolute 0.4 0.1 - 1.0 K/uL   Eosinophils Relative 1 %   Eosinophils Absolute 0.1 0.0 - 0.5 K/uL   Basophils Relative 0 %   Basophils Absolute 0.0 0.0 - 0.1 K/uL   Immature Granulocytes 0 %   Abs Immature Granulocytes 0.03 0.00 - 0.07 K/uL    Comment: Performed at [facility], [place] [address] [place], [place]  [place]     Status: None   Collection Time: 12/11/22  8:37 AM  Result Value Ref Range   Prothrombin Time 13.6 11.4 - 15.2 seconds   INR 1.1 0.8 - 1.2    Comment: (NOTE) INR goal varies based on device and disease states. Performed at [facility], [place] [address], [place], [place] [address]   APTT     Status: None   Collection Time: 12/11/22  8:37 AM  Result Value Ref Range   aPTT 25 24 - 36 seconds    Comment: Performed at [facility], [place] [address] [place], [place] [address]  Troponin I (High Sensitivity)     Status: None   Collection Time: 12/11/22  8:37 AM  Result Value Ref Range   Troponin I (High Sensitivity) 9 <18 ng/L    Comment: (NOTE) Elevated high sensitivity troponin I (hsTnI) values and significant  changes across serial measurements may suggest ACS but many other  chronic and acute conditions are known to elevate hsTnI results.  Refer to the "Links" section for chest pain algorithms and additional  guidance. Performed at [facility], [place] [address] [place], [place] [address]   Resp panel by RT-PCR (RSV, Flu A&B, Covid) Anterior Nasal Swab     Status: None   Collection Time: 12/11/22  9:12 AM   Specimen: Anterior Nasal Swab  Result Value Ref Range   SARS Coronavirus 2 by RT PCR NEGATIVE NEGATIVE    Comment: (NOTE) SARS-CoV-2 target nucleic acids are NOT DETECTED.  The SARS-CoV-2 RNA is generally detectable in upper respiratory specimens during the acute phase of infection. The lowest concentration of SARS-CoV-2 viral copies this  assay can detect is 138 copies/mL. A negative result does not preclude SARS-Cov-2 infection and should not be used as the sole basis for treatment or other patient management decisions. A negative result may occur with  improper specimen collection/handling, submission of specimen other than nasopharyngeal swab, presence of viral mutation(s) within the areas targeted by this assay, and inadequate number of viral copies(<138 copies/mL). A negative result must be combined with clinical observations, patient history, and epidemiological information. The expected result is Negative.  Fact Sheet for Patients:  EntrepreneurPulse.com.au  Fact Sheet for Healthcare Providers:  IncredibleEmployment.be  This test is no t yet approved or cleared by the [place] FDA and  has been authorized for detection and/or diagnosis of SARS-CoV-2 by FDA under an Emergency Use Authorization (EUA). This EUA will remain  in effect (meaning this test can be used) for the duration of the COVID-19 declaration under Section 564(b)(1) of the Act, 21 U.S.C.section 360bbb-3(b)(1), unless the authorization is terminated  or revoked sooner.       Influenza A by PCR NEGATIVE NEGATIVE   Influenza B by PCR NEGATIVE NEGATIVE    Comment: (NOTE) The Xpert Xpress SARS-CoV-2/FLU/RSV plus assay is intended as an aid in the diagnosis of influenza from Nasopharyngeal swab specimens and should not be used as a sole basis for treatment. Nasal washings and aspirates are unacceptable for Xpert Xpress SARS-CoV-2/FLU/RSV testing.  Fact Sheet for Patients: EntrepreneurPulse.com.au  Fact Sheet for Healthcare Providers: IncredibleEmployment.be  This test is not yet approved or cleared by the [place] FDA and has been authorized for detection and/or diagnosis of SARS-CoV-2 by FDA under an Emergency Use Authorization (EUA). This EUA will remain in  effect (meaning this test can be used) for the duration of the COVID-19 declaration under Section 564(b)(1)  of the Act, 21 U.S.C. section 360bbb-3(b)(1), unless the authorization is terminated or revoked.     Resp Syncytial Virus by PCR NEGATIVE NEGATIVE    Comment: (NOTE) Fact Sheet for Patients: EntrepreneurPulse.com.au  Fact Sheet for Healthcare Providers: IncredibleEmployment.be  This test is not yet approved or cleared by the [place] FDA and has been authorized for detection and/or diagnosis of SARS-CoV-2 by FDA under an Emergency Use Authorization (EUA). This EUA will remain in effect (meaning this test can be used) for the duration of the COVID-19 declaration under Section 564(b)(1) of the Act, 21 U.S.C. section 360bbb-3(b)(1), unless the authorization is terminated or revoked.  Performed at [facility], [place] [address] [place], [place] [address]   Urinalysis, Routine w reflex microscopic -     Status: None   Collection Time: 12/11/22 10:37 AM  Result Value Ref Range   Color, Urine YELLOW YELLOW   APPearance CLEAR CLEAR   Specific Gravity, Urine 1.020 1.005 - 1.030   pH 7.0 5.0 - 8.0   Glucose, UA NEGATIVE NEGATIVE mg/dL   Hgb urine dipstick NEGATIVE NEGATIVE   Bilirubin Urine NEGATIVE NEGATIVE   Ketones, ur NEGATIVE NEGATIVE mg/dL   Protein, ur NEGATIVE NEGATIVE mg/dL   Nitrite NEGATIVE NEGATIVE   Leukocytes,Ua NEGATIVE NEGATIVE    Comment: Performed at [place] [address] [place], [place] [address]  Troponin I (High Sensitivity)     Status: None   Collection Time: 12/11/22 11:50 AM  Result Value Ref Range   Troponin I (High Sensitivity) 16 <18 ng/L    Comment: (NOTE) Elevated high sensitivity troponin I (hsTnI) values and significant  changes across serial measurements may suggest ACS but many other  chronic and acute conditions are known to elevate hsTnI results.  Refer  to the "Links" section for chest pain algorithms and additional  guidance. Performed at [facility], [place] [address], [place], [place] [address]   Lactic acid, plasma     Status: Abnormal   Collection Time: 12/11/22 11:51 AM  Result Value Ref Range   Lactic Acid, Venous 4.3 (HH) 0.5 - 1.9 mmol/L    Comment: CRITICAL VALUE NOTED. VALUE IS CONSISTENT WITH PREVIOUSLY REPORTED/CALLED VALUE Performed at [facility] [address], [place], [place] [address]   Lactic acid, plasma     Status: Abnormal   Collection Time: 12/11/22  2:25 PM  Result Value Ref Range   Lactic Acid, Venous 2.6 (HH) 0.5 - 1.9 mmol/L    Comment: CRITICAL VALUE NOTED. VALUE IS CONSISTENT WITH PREVIOUSLY REPORTED/CALLED VALUE Performed at [place] [address] [place],  [address]    DG Chest Port 1 View  Result Date: 12/11/2022 CLINICAL DATA:  Sepsis, shortness of breath EXAM: PORTABLE CHEST 1 VIEW COMPARISON:  11/12/2022 FINDINGS: Stable heart size. Chronic elevation of the right hemidiaphragm. Dense airspace consolidation within the periphery of the left mid lung. Additional airspace opacity at the right lung base. No pleural effusion or pneumothorax. IMPRESSION: Airspace consolidations within the left mid lung and right lung base, most compatible with multifocal pneumonia. Electronically Signed   By: Davina Poke D.O.   On: 12/11/2022 09:08    Pending Labs Unresulted Labs (From admission, onward)     Start     Ordered   12/18/22 0500  Creatinine, serum  (enoxaparin (LOVENOX)    CrCl >/= 30 ml/min)  Weekly,   R     Comments: while on enoxaparin therapy  12/11/22 1553   12/11/22 1552  Expectorated Sputum Assessment w Gram Stain, Rflx to Resp Cult  (COPD / Pneumonia / Cellulitis / Lower Extremity Wound)  Once,   R        12/11/22 1553   12/11/22 1552  Strep pneumoniae urinary antigen  (COPD / Pneumonia / Cellulitis / Lower Extremity  Wound)  Once,   R        12/11/22 1553   12/11/22 1552  Legionella Pneumophila Serogp 1 Ur Ag  (COPD / Pneumonia / Cellulitis / Lower Extremity Wound)  Once,   R        12/11/22 1553   12/11/22 1550  CBC  (enoxaparin (LOVENOX)    CrCl >/= 30 ml/min)  Once,   R       Comments: Baseline for enoxaparin therapy IF NOT ALREADY DRAWN.  Notify MD if PLT < 100 K.    12/11/22 1553   12/11/22 1550  Creatinine, serum  (enoxaparin (LOVENOX)    CrCl >/= 30 ml/min)  Once,   R       Comments: Baseline for enoxaparin therapy IF NOT ALREADY DRAWN.    12/11/22 1553   12/11/22 1549  MRSA Next Gen by PCR, Nasal  Once,   R        12/11/22 1548   12/11/22 1417  Lactic acid, plasma  STAT Now then every 3 hours,   R      12/11/22 1416   12/11/22 0837  Blood Culture (routine x 2)  (Septic presentation on arrival (screening labs, nursing and treatment orders for obvious sepsis))  BLOOD CULTURE X 2,   STAT      12/11/22 0837   12/11/22 0837  Urine Culture  (Septic presentation on arrival (screening labs, nursing and treatment orders for obvious sepsis))  ONCE - URGENT,   URGENT       Question:  Indication  Answer:  Dysuria   12/11/22 0837            Vitals/Pain Today's Vitals   12/11/22 1600 12/11/22 1630 12/11/22 2000 12/11/22 2016  BP: (!) 138/57 136/80 (!) 148/85   Pulse: 97 90 87   Resp: 18 (!) 22 20   Temp:    98 F (36.7 C)  TempSrc:      SpO2: 97% 95% 96%   Weight:      Height:      PainSc:        Isolation Precautions No active isolations  Medications Medications  lactated ringers infusion ( Intravenous New Bag/Given 12/11/22 0936)  albuterol (PROVENTIL) (2.5 MG/3ML) 0.083% nebulizer solution 2.5 mg (has no administration in time range)  loratadine (CLARITIN) tablet 10 mg (10 mg Oral Not Given 12/11/22 1801)  fluticasone (FLONASE) 50 MCG/ACT nasal spray 2 spray (has no administration in time range)  guaiFENesin (ROBITUSSIN) 100 MG/5ML liquid 15 mL (has no administration in time range)   loperamide HCl (IMODIUM) 1 MG/7.5ML suspension 2 mg (has no administration in time range)  feeding supplement (OSMOLITE 1.5 CAL) liquid 1,425 mL (has no administration in time range)  ondansetron (ZOFRAN) tablet 4 mg (has no administration in time range)  saccharomyces boulardii (FLORASTOR) capsule 250 mg (has no administration in time range)  polyvinyl alcohol (LIQUIFILM TEARS) 1.4 % ophthalmic solution 1 drop (has no administration in time range)  enoxaparin (LOVENOX) injection 40 mg (40 mg Subcutaneous Given 12/11/22 2214)  Ampicillin-Sulbactam (UNASYN) 3 g in sodium chloride 0.9 % 100 mL IVPB (3 g Intravenous New  Bag/Given 12/11/22 1800)  famotidine (PEPCID) 40 MG/5ML suspension 20 mg (has no administration in time range)  lactated ringers bolus 1,000 mL (0 mLs Intravenous Stopped 12/11/22 1140)  ceFEPIme (MAXIPIME) 2 g in sodium chloride 0.9 % 100 mL IVPB (0 g Intravenous Stopped 12/11/22 0933)  metroNIDAZOLE (FLAGYL) IVPB 500 mg (0 mg Intravenous Stopped 12/11/22 1002)  vancomycin (VANCOREADY) IVPB 1500 mg/300 mL (0 mg Intravenous Stopped 12/11/22 1104)  lactated ringers bolus 1,000 mL (0 mLs Intravenous Stopped 12/11/22 1140)    Mobility walks     Focused Assessments    R Recommendations: See Admitting Provider Note  Report given to:   Additional Notes:

## 2022-12-11 NOTE — H&P (Signed)
History and Physical    Lee Barnes IOM:[id number] DOB: 1951-07-26 DOA: 12/11/2022  PCP: Denita Lung, MD Patient coming from: Home  Chief Complaint: Shortness of breath  HPI: Lee Barnes is a 72 y.o. 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  Patient takes all his medications through his PEG tube he does not take anything by mouth.  However he was trying to cough up thick phlegm that did gagging and was not able to bring up the phlegm.  He denied fever at home.  He complains of cough and shortness of breath.  His saturation was 84% on room air in the ER.  He was placed on 4 L of oxygen with improvement in his oxygenation.  He does not have oxygen at home.   He was tachycardic hypotensive and tachypneic with lactic acidosis he denies any chest pain abdominal pain diarrhea.  He lives at home with his wife.   No urinary complaints of dysuria or hematuria. In the ER he received vancomycin, cefepime, Flagyl and IV fluids. Lactic acid 2.9> 4.3> 2.6 WBC count 11 creatinine 1.13 Chest x-ray showed multifocal pneumonia Patient with history of chronic recurrent aspiration pneumonia.  Has had 5 episodes of aspiration pneumonia in the last year for them requiring hospital stay.  Review of Systems: As per HPI otherwise all other systems reviewed and are negative  Ambulatory Status: Ambulatory at baseline  Past Medical History:  Diagnosis Date   Allergy to environmental factors    Basal cell carcinoma of left cheek 8/45/3646   Complication of anesthesia    "vageled" after surgery -overnite stay   Diverticulosis 2008   Heart murmur    hx of in childhood    History of chemotherapy    History of radiation therapy 11/05/10-12/26/10   r base tongue, 7000 cGy 35 sessions   Hypertension    Medication started in May 2016.   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   Pneumonia    hx of 2012    Squamous cell carcinoma    right base of tongue   Tinnitus     Past Surgical History:  Procedure Laterality Date   APPENDECTOMY     COLONOSCOPY     INGUINAL HERNIA REPAIR Bilateral 03/30/2015   Procedure: LAPAROSCOPIC BILATERAL INGUINAL HERNIA REPAIR WITH MESH;  Surgeon: Coralie Keens, MD;  Location: [facility];  Service: General;  Laterality: Bilateral;   INGUINAL HERNIA REPAIR Right 11/18/1958   INGUINAL HERNIA REPAIR Bilateral 03/30/2015   INGUINAL HERNIA REPAIR Left 07/11/2015   INGUINAL HERNIA REPAIR N/A 07/11/2015   Procedure: REPAIR OF LEFT INGUINAL HERNIA WITH MESH;  Surgeon: Coralie Keens, MD;  Location: [facility];  Service: General;  Laterality: N/A;   INSERTION OF MESH N/A 07/11/2015   Procedure: INSERTION OF MESH;  Surgeon: Coralie Keens, MD;  Location: [facility];  Service: General;  Laterality: N/A;   IR [place] W/FLUORO  12/03/2021   LUMBAR [place] SURGERY     PEG PLACEMENT N/A 03/22/2022   Procedure: PERCUTANEOUS ENDOSCOPIC GASTROSTOMY (PEG) REPLACEMENT;  Surgeon: Carol Ada, MD;  Location: [facility];  Service: Gastroenterology;  Laterality: N/A;   SAVORY DILATION N/A 03/22/2022   Procedure: SAVORY DILATION;  Surgeon: Carol Ada, MD;  Location: [facility];  Service: Gastroenterology;  Laterality: N/A;   SKIN BIOPSY Left 06/24/2022   basal cell carcinoma face cheek   TONSILLECTOMY  Social History   Socioeconomic History   Marital status: Married    Spouse name: Not on file   Number of children: 3   Years of education: Not on file   Highest education level: Not on file  Occupational History   Occupation: OWNER    Employer: [employer]  Tobacco Use   Smoking status: Never   Smokeless tobacco: Never  Vaping Use   Vaping Use: Never used  Substance and Sexual Activity   Alcohol use: Yes    Comment: occas drinks a beer every few weeks    Drug use: No    Comment:     Sexual activity: Yes  Other Topics Concern   Not on  file  Social History Narrative   Not on file   Social Determinants of Health   Financial Resource Strain: Low Risk  (11/01/2022)   Overall Financial Resource Strain (CARDIA)    Difficulty of Paying Living Expenses: Not hard at all  Food Insecurity: No Food Insecurity (11/13/2022)   Hunger Vital Sign    Worried About Running Out of Food in the Last Year: Never true    [place] in the Last Year: Never true  Transportation Needs: No Transportation Needs (11/13/2022)   PRAPARE - Hydrologist (Medical): No    Lack of Transportation (Non-Medical): No  Physical Activity: Inactive (11/01/2022)   Exercise Vital Sign    Days of Exercise per Week: 0 days    Minutes of Exercise per Session: 0 min  Stress: No Stress Concern Present (11/01/2022)   [place]    Feeling of Stress : Not at all  Social Connections: Not on file  Intimate Partner Violence: Not At Risk (11/13/2022)   Humiliation, Afraid, Rape, and Kick questionnaire    Fear of Current or Ex-Partner: No    Emotionally Abused: No    Physically Abused: No    Sexually Abused: No    Allergies  Allergen Reactions   Codeine Nausea And Vomiting    Family History  Problem Relation Age of Onset   Heart disease Father    Cancer Father        throat ca   Heart disease Mother    Colon cancer Neg Hx      Prior to Admission medications   Medication Sig Start Date End Date Taking? Authorizing Provider  albuterol (PROVENTIL) (2.5 MG/3ML) 0.083% nebulizer solution Take 3 mLs (2.5 mg total) by nebulization every 4 (four) hours as needed for wheezing. 10/15/22  Yes Mariel Aloe, MD  cetirizine (ZYRTEC) 10 MG chewable tablet Place 10 mg into feeding tube daily as needed for allergies or rhinitis.   Yes [provider]  fluticasone (FLONASE) 50 MCG/ACT nasal spray SPRAY TWO SPRAYS IN EACH NOSTRIL ONCE DAILY AS NEEDED Patient  taking differently: Place 2 sprays into both nostrils at bedtime. 06/05/22  Yes Denita Lung, MD  guaiFENesin (ROBITUSSIN) 100 MG/5ML liquid Place 15 mLs into feeding tube every 6 (six) hours as needed for cough or to loosen phlegm. 10/15/22  Yes Mariel Aloe, MD  loperamide (IMODIUM) 2 MG capsule 2 mg See admin instructions. Place 2 mg into feeding tube daily as needed for diarrhea. Usually given during a feeding.   Yes [provider]  midodrine (PROAMATINE) 2.5 MG tablet Place 2.5 mg into feeding tube See admin instructions. Place 2.5 mg into feeding tube one to three times a  day as needed for hypotension 06/18/22 06/13/23 Yes [provider]  Nutritional Supplements (FEEDING SUPPLEMENT, OSMOLITE 1.5 CAL,) LIQD Place 1,425 mLs into feeding tube daily. 10/16/22  Yes Mariel Aloe, MD  omeprazole (PRILOSEC) 40 MG capsule Removed the granules from the capsule and place him in the G-tube daily. Patient taking differently: 40 mg See admin instructions. Remove the granules from the capsule (40 mg) and place in the G-tube daily 06/28/22  Yes Denita Lung, MD  ondansetron (ZOFRAN) 4 MG tablet Take 1 tablet (4 mg total) by mouth every 8 (eight) hours as needed for nausea or vomiting. Patient taking differently: Place 4 mg into feeding tube every 8 (eight) hours as needed for nausea or vomiting. 10/24/22  Yes Denita Lung, MD  Pedialyte (PEDIALYTE) SOLN Place 355 mLs into feeding tube daily.   Yes [provider]  Saccharomyces boulardii (FLORASTOR PO) Take 250 mg by mouth daily as needed (GI irritation).   Yes [provider]  SYSTANE ULTRA PF 0.4-0.3 % SOLN Place 1 drop into both eyes daily as needed (for irritation).   Yes [provider]    Physical Exam: Vitals:   12/11/22 1130 12/11/22 1205 12/11/22 1430 12/11/22 1500  BP: (!) 82/61 132/82 92/62   Pulse: (!) 111  92   Resp: 20  (!) 22   Temp:    98.8 F (37.1 C)  TempSrc:      SpO2: 95%  94%    Weight:      Height:         General: Appears chronically ill-appearing Eyes:  PERRL, EOMI, normal lids, iris ENT:  grossly normal hearing, lips & tongue, mmm Neck:  no LAD, masses or thyromegaly Cardiovascular:  RRR, no m/r/g. No LE edema.  Respiratory: coarse bilaterally, no w/r/r. Normal respiratory effort. Abdomen:  soft, ntnd, NABS Skin:  no rash or induration seen on limited exam Musculoskeletal:  grossly normal tone BUE/BLE, good ROM, no bony abnormality Psychiatric:  grossly normal mood and affect, speech fluent and appropriate, AOx3 Neurologic:  CN 2-12 grossly intact, moves all extremities in coordinated fashion, sensation intact  Labs on Admission: I have personally reviewed following labs and imaging studies  CBC: Recent Labs  Lab 12/11/22 0837  WBC 11.0*  NEUTROABS 10.3*  HGB 13.5  HCT 42.1  MCV 88.8  PLT 242   Basic Metabolic Panel: Recent Labs  Lab 12/11/22 0837  NA 138  K 4.8  CL 103  CO2 26  GLUCOSE 104*  BUN 47*  CREATININE 1.13  CALCIUM 9.3   GFR: Estimated Creatinine Clearance: 62.8 mL/min (by C-G formula based on SCr of 1.13 mg/dL). Liver Function Tests: Recent Labs  Lab 12/11/22 0837  AST 35  ALT 23  ALKPHOS 102  BILITOT 0.4  PROT 8.5*  ALBUMIN 3.9   No results for input(s): "LIPASE", "AMYLASE" in the last 168 hours. No results for input(s): "AMMONIA" in the last 168 hours. Coagulation Profile: Recent Labs  Lab 12/11/22 0837  INR 1.1   Cardiac Enzymes: No results for input(s): "CKTOTAL", "CKMB", "CKMBINDEX", "TROPONINI" in the last 168 hours. BNP (last 3 results) No results for input(s): "PROBNP" in the last 8760 hours. HbA1C: No results for input(s): "HGBA1C" in the last 72 hours. CBG: No results for input(s): "GLUCAP" in the last 168 hours. Lipid Profile: No results for input(s): "CHOL", "HDL", "LDLCALC", "TRIG", "CHOLHDL", "LDLDIRECT" in the last 72 hours. Thyroid Function Tests: No results for input(s): "TSH",  "T4TOTAL", "FREET4", "T3FREE", "THYROIDAB"  in the last 72 hours. Anemia Panel: No results for input(s): "VITAMINB12", "FOLATE", "FERRITIN", "TIBC", "IRON", "RETICCTPCT" in the last 72 hours. Urine analysis:    Component Value Date/Time   COLORURINE YELLOW 12/11/2022 1037   APPEARANCEUR CLEAR 12/11/2022 1037   LABSPEC 1.020 12/11/2022 1037   LABSPEC 1.015 12/13/2010 1028   PHURINE 7.0 12/11/2022 1037   GLUCOSEU NEGATIVE 12/11/2022 1037   HGBUR NEGATIVE 12/11/2022 1037   BILIRUBINUR NEGATIVE 12/11/2022 1037   BILIRUBINUR n 02/19/2016 1519   BILIRUBINUR Negative 12/13/2010 1028   KETONESUR NEGATIVE 12/11/2022 1037   PROTEINUR NEGATIVE 12/11/2022 1037   UROBILINOGEN negative 02/19/2016 1519   UROBILINOGEN 0.2 04/01/2015 2316   NITRITE NEGATIVE 12/11/2022 1037   LEUKOCYTESUR NEGATIVE 12/11/2022 1037   LEUKOCYTESUR Negative 12/13/2010 1028    Creatinine Clearance: Estimated Creatinine Clearance: 62.8 mL/min (by C-G formula based on SCr of 1.13 mg/dL).  Sepsis Labs: '@LABRCNTIP'$ (procalcitonin:4,lacticidven:4) ) Recent Results (from the past 240 hour(s))  Resp panel by RT-PCR (RSV, Flu A&B, Covid) Anterior Nasal Swab     Status: None   Collection Time: 12/11/22  9:12 AM   Specimen: Anterior Nasal Swab  Result Value Ref Range Status   SARS Coronavirus 2 by RT PCR NEGATIVE NEGATIVE Final    Comment: (NOTE) SARS-CoV-2 target nucleic acids are NOT DETECTED.  The SARS-CoV-2 RNA is generally detectable in upper respiratory specimens during the acute phase of infection. The lowest concentration of SARS-CoV-2 viral copies this assay can detect is 138 copies/mL. A negative result does not preclude SARS-Cov-2 infection and should not be used as the sole basis for treatment or other patient management decisions. A negative result may occur with  improper specimen collection/handling, submission of specimen other than nasopharyngeal swab, presence of viral mutation(s) within the areas  targeted by this assay, and inadequate number of viral copies(<138 copies/mL). A negative result must be combined with clinical observations, patient history, and epidemiological information. The expected result is Negative.  Fact Sheet for Patients:  EntrepreneurPulse.com.au  Fact Sheet for Healthcare Providers:  IncredibleEmployment.be  This test is no t yet approved or cleared by the [place] FDA and  has been authorized for detection and/or diagnosis of SARS-CoV-2 by FDA under an Emergency Use Authorization (EUA). This EUA will remain  in effect (meaning this test can be used) for the duration of the COVID-19 declaration under Section 564(b)(1) of the Act, 21 U.S.C.section 360bbb-3(b)(1), unless the authorization is terminated  or revoked sooner.       Influenza A by PCR NEGATIVE NEGATIVE Final   Influenza B by PCR NEGATIVE NEGATIVE Final    Comment: (NOTE) The Xpert Xpress SARS-CoV-2/FLU/RSV plus assay is intended as an aid in the diagnosis of influenza from Nasopharyngeal swab specimens and should not be used as a sole basis for treatment. Nasal washings and aspirates are unacceptable for Xpert Xpress SARS-CoV-2/FLU/RSV testing.  Fact Sheet for Patients: EntrepreneurPulse.com.au  Fact Sheet for Healthcare Providers: IncredibleEmployment.be  This test is not yet approved or cleared by the [place] FDA and has been authorized for detection and/or diagnosis of SARS-CoV-2 by FDA under an Emergency Use Authorization (EUA). This EUA will remain in effect (meaning this test can be used) for the duration of the COVID-19 declaration under Section 564(b)(1) of the Act, 21 U.S.C. section 360bbb-3(b)(1), unless the authorization is terminated or revoked.     Resp Syncytial Virus by PCR NEGATIVE NEGATIVE Final    Comment: (NOTE) Fact Sheet for  Patients: EntrepreneurPulse.com.au  Fact Sheet for Healthcare Providers:  IncredibleEmployment.be  This test is not yet approved or cleared by the [place] and has been authorized for detection and/or diagnosis of SARS-CoV-2 by FDA under an Emergency Use Authorization (EUA). This EUA will remain in effect (meaning this test can be used) for the duration of the COVID-19 declaration under Section 564(b)(1) of the Act, 21 U.S.C. section 360bbb-3(b)(1), unless the authorization is terminated or revoked.  Performed at [facility], [place] [address] [place], [place] [address]      Radiological Exams on Admission: DG Chest Port 1 View  Result Date: 12/11/2022 CLINICAL DATA:  Sepsis, shortness of breath EXAM: PORTABLE CHEST 1 VIEW COMPARISON:  11/12/2022 FINDINGS: Stable heart size. Chronic elevation of the right hemidiaphragm. Dense airspace consolidation within the periphery of the left mid lung. Additional airspace opacity at the right lung base. No pleural effusion or pneumothorax. IMPRESSION: Airspace consolidations within the left mid lung and right lung base, most compatible with multifocal pneumonia. Electronically Signed   By: Davina Poke D.O.   On: 12/11/2022 09:08    EKG: J-point elevation inferior leads Chest x-ray multifocal pneumonia Assessment/Plan Principal Problem:   SOB (shortness of breath)   #1 sepsis patient presented with complaints of shortness of breath and cough.  He was tachypneic tachycardic hypotensive with lactic  acidosis with endorgan damage  hypoxia requiring 4 L of fluid to keep his saturation above 92%.  He was 84% on arrival to the ER on room air. Will treat him with Rocephin and azithromycin Check urine Legionella and strep pneumo MRSA Follow-up blood culture IV fluids  #2 acute hypoxic respiratory failure secondary to multifocal pneumonia in the setting of oral cancer Nebulizer  treatments IV Rocephin azithromycin O2 to keep sats above 92% COVID flu and RSV negative  #3 abnormal EKG negative.  ED physician discussed with Dr. Burt Knack no new recommendations.    Estimated body mass index is 22.13 kg/m as calculated from the following:   Height as of this encounter: 6' (1.829 m).   Weight as of this encounter: 74 kg.   DVT prophylaxis: Lovenox Code Status: Full code Family Communication: Discussed with wife at bedside Disposition Plan: Pending clinical improvement Consults called: None Admission status: Inpatient  Georgette Shell MD [facility]  If 7PM-7AM, please contact night-coverage [email or web address] Password [facility]  12/11/2022, 3:44 PM

## 2022-12-11 NOTE — Progress Notes (Signed)
PHARMACY NOTE -  Unasyn  Pharmacy has been assisting with dosing of Unasyn for aspiration pneumonia. Dosage remains stable at 3g IV q6 hr and further renal adjustments per institutional Pharmacy antibiotic protocol  Pharmacy will sign off, following peripherally for culture results, dose adjustments, and length of therapy. Please reconsult if a change in clinical status warrants re-evaluation of dosage.  Reuel Boom, PharmD, BCPS [phone] 12/11/2022, 4:40 PM

## 2022-12-11 NOTE — Sepsis Progress Note (Signed)
Code sepsis protocol being monitored by eLink. 

## 2022-12-11 NOTE — Progress Notes (Signed)
A consult was received from an ED physician for Vanco, Cefepime per pharmacy dosing.  The patient's profile has been reviewed for ht/wt/allergies/indication/available labs.   A one time order has been placed for Vanco '1500mg'$  x 1 and Cefepime 2g IV x 1.  Further antibiotics/pharmacy consults should be ordered by admitting physician if indicated.                        Luane Rochon S. Alford Highland, PharmD, BCPS Clinical Staff Pharmacist [email or web address] Thank you, [place] 12/11/2022  8:44 AM

## 2022-12-12 ENCOUNTER — Encounter (HOSPITAL_COMMUNITY): Payer: Self-pay | Admitting: Internal Medicine

## 2022-12-12 DIAGNOSIS — R0602 Shortness of breath: Secondary | ICD-10-CM | POA: Diagnosis not present

## 2022-12-12 LAB — URINE CULTURE: Culture: NO GROWTH

## 2022-12-12 LAB — COMPREHENSIVE METABOLIC PANEL
ALT: 16 U/L (ref 0–44)
AST: 20 U/L (ref 15–41)
Albumin: 3 g/dL — ABNORMAL LOW (ref 3.5–5.0)
Alkaline Phosphatase: 75 U/L (ref 38–126)
Anion gap: 8 (ref 5–15)
BUN: 29 mg/dL — ABNORMAL HIGH (ref 8–23)
CO2: 25 mmol/L (ref 22–32)
Calcium: 8.7 mg/dL — ABNORMAL LOW (ref 8.9–10.3)
Chloride: 102 mmol/L (ref 98–111)
Creatinine, Ser: 0.81 mg/dL (ref 0.61–1.24)
GFR, Estimated: >60 mL/min (ref >60)
Glucose, Bld: 90 mg/dL (ref 70–99)
Potassium: 4.3 mmol/L (ref 3.5–5.1)
Sodium: 135 mmol/L (ref 135–145)
Total Bilirubin: 1 mg/dL (ref 0.3–1.2)
Total Protein: 6.7 g/dL (ref 6.5–8.1)

## 2022-12-12 LAB — CBC
HCT: 31.9 % — ABNORMAL LOW (ref 39.0–52.0)
Hemoglobin: 10.3 g/dL — ABNORMAL LOW (ref 13.0–17.0)
MCH: 28.9 pg (ref 26.0–34.0)
MCHC: 32.3 g/dL (ref 30.0–36.0)
MCV: 89.6 fL (ref 80.0–100.0)
Platelets: 177 10*3/uL (ref 150–400)
RBC: 3.56 MIL/uL — ABNORMAL LOW (ref 4.22–5.81)
RDW: 15.1 % (ref 11.5–15.5)
WBC: 14.2 10*3/uL — ABNORMAL HIGH (ref 4.0–10.5)
nRBC: 0 % (ref 0.0–0.2)

## 2022-12-12 LAB — EXPECTORATED SPUTUM ASSESSMENT W GRAM STAIN, RFLX TO RESP C

## 2022-12-12 LAB — MRSA NEXT GEN BY PCR, NASAL: MRSA by PCR Next Gen: NOT DETECTED

## 2022-12-12 LAB — STREP PNEUMONIAE URINARY ANTIGEN: Strep Pneumo Urinary Antigen: NEGATIVE

## 2022-12-12 MED ORDER — OSMOLITE 1.5 CAL PO LIQD
1425.0000 mL | Freq: Once | ORAL | Status: AC
  Administered 2022-12-12: 1425 mL
  Filled 2022-12-12: qty 2000

## 2022-12-12 MED ORDER — OSMOLITE 1.5 CAL PO LIQD
1000.0000 mL | ORAL | Status: DC
  Filled 2022-12-12: qty 1000

## 2022-12-12 MED ORDER — HYDRALAZINE HCL 20 MG/ML IJ SOLN
5.0000 mg | Freq: Once | INTRAMUSCULAR | Status: AC
  Administered 2022-12-12: 5 mg via INTRAVENOUS
  Filled 2022-12-12: qty 1

## 2022-12-12 MED ORDER — SALINE SPRAY 0.65 % NA SOLN: 1.0000 | NASAL | Status: DC | PRN

## 2022-12-12 MED ORDER — OSMOLITE 1.5 CAL PO LIQD
1425.0000 mL | ORAL | Status: DC
  Administered 2022-12-13: 1425 mL
  Filled 2022-12-12: qty 2000

## 2022-12-12 NOTE — Plan of Care (Signed)
  Problem: Respiratory: Goal: Ability to maintain adequate ventilation will improve Outcome: Progressing Goal: Ability to maintain a clear airway will improve Outcome: Progressing   Problem: Clinical Measurements: Goal: Cardiovascular complication will be avoided Outcome: Progressing

## 2022-12-12 NOTE — TOC Initial Note (Addendum)
Transition of Care [facility]) - Initial/Assessment Note    Patient Details  Name: Lee Barnes MRN: [id number] Date of Birth: 01-14-1951  Transition of [facility]) CM/SW Contact:    Dessa Phi, RN Phone Number: 12/12/2022, 2:02 PM  Clinical Narrative:Active w/ [facility] w/rep Tommi Rumps.[employer] rep Erasmo Downer aware of active w/PEG supplies, osmolite formula. Has own transport home.                   Expected Discharge Plan: [place] Barriers to Discharge: Continued Medical Work up   Patient Goals and CMS Choice Patient states their goals for this hospitalization and ongoing recovery are:: Home CMS Medicare.gov Compare Post Acute Care list provided to:: Patient Represenative (must comment) Choice offered to / list presented to : Spouse      Expected Discharge Plan and Services                                              Prior Living Arrangements/Services                       Activities of Daily Living Home Assistive Devices/Equipment: Nebulizer, Hearing aid, Enteral Feeding Supplies, Eyeglasses, Other (Comment) (suction) ADL Screening (condition at time of admission) Patient's cognitive ability adequate to safely complete daily activities?: Yes Is the patient deaf or have difficulty hearing?: Yes Does the patient have difficulty seeing, even when wearing glasses/contacts?: No Does the patient have difficulty concentrating, remembering, or making decisions?: No Patient able to express need for assistance with ADLs?: Yes Does the patient have difficulty dressing or bathing?: No Independently performs ADLs?: Yes (appropriate for developmental age) Does the patient have difficulty walking or climbing stairs?: No Weakness of Legs: None Weakness of Arms/Hands: None  Permission Sought/Granted                  Emotional Assessment              Admission diagnosis:  SOB (shortness of breath) [R06.02] Aspiration pneumonia of  left lower lobe, unspecified aspiration pneumonia type ([place]) [J69.0] Sepsis with acute hypoxic respiratory failure without septic shock, due to unspecified organism ([place]) [A41.9, R65.20, J96.01] Patient Active Problem List   Diagnosis Date Noted   SOB (shortness of breath) 12/11/2022   G tube feedings ([place]) 11/27/2022   Acute aspiration pneumonia ([place]) 11/13/2022   Chronic diarrhea 11/13/2022   Hyperkalemia 11/13/2022   Hypertensive urgency 10/10/2022   Normocytic anemia 10/10/2022   Protein-calorie malnutrition, severe 10/09/2022   Basal cell carcinoma of left cheek 07/17/2022   GERD (gastroesophageal reflux disease) 06/27/2022   On tube feeding diet 06/27/2022   Acute respiratory failure with hypoxia ([place]) 06/26/2022   Chronic hyponatremia 02/20/2021   Dysphagia 12/12/2020   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   ACE-inhibitor cough 05/25/2015   Essential hypertension 03/21/2015   Allergic rhinitis 12/14/2013   ED (erectile dysfunction) 12/14/2013   Diverticulosis    History of oropharyngeal cancer 09/18/2010   PCP:  Denita Lung, MD Pharmacy:   [id number] - [place], [place] [address] Phone: [phone] Fax: [place] [address] Phone: [phone] Fax: [phone]     Social Determinants  of Health (SDOH) Social History: SDOH Screenings   Food Insecurity: No Food Insecurity (12/11/2022)  Housing: Low Risk  (12/11/2022)  Transportation Needs: No Transportation Needs (12/11/2022)  Utilities: Not At Risk (12/11/2022)  Depression (PHQ2-9): Low Risk  (11/01/2022)  Financial Resource Strain: Low Risk  (11/01/2022)  Physical Activity: Inactive (11/01/2022)  Stress: No Stress Concern Present (11/01/2022)  Tobacco Use: Low Risk  (12/12/2022)   SDOH Interventions:      Readmission Risk Interventions     No data to display

## 2022-12-12 NOTE — Evaluation (Signed)
Physical Therapy Evaluation Patient Details Name: Lee Barnes MRN: [id number] DOB: 1951-04-18 Today's Date: 12/12/2022  History of Present Illness  Pt is a 72 y/o male presenting to [facility] ED on 1/24 with SOB. Of note five hospitalizations within year for aspiration PNA. PMH: hx of squamous cell carcinoma of L cheek, diverticulosis, childhood heart murmur, HTN, oropharyngeal cancer, tinnitis, hx of chemo and radiation, PEG tube placement,   Clinical Impression  Pt presents with the problems above and functional impairments below, pt on 5LO2 via [place] upon entry, saturating 95% at rest, turned off O2 to complete walking saturation test (see results below). Pt has normal strength grossly in all four extremities, reports no history of falling or requiring assistance with ADLs. Pt maintained SpO2 90% or above on RA, left O2 off at exit, RN notified of results and pt off O2. Patient evaluated by Physical Therapy with no further acute PT needs identified. All education has been completed and the patient has no further questions.  See below for any follow-up Physical Therapy or equipment needs. PT is signing off. Thank you for this referral.    Patient Saturations on Room Air at Rest = 94% Patient Saturations on Room Air while Ambulating = 91%     Recommendations for follow up therapy are one component of a multi-disciplinary discharge planning process, led by the attending physician.  Recommendations may be updated based on patient status, additional functional criteria and insurance authorization.  Follow Up Recommendations No PT follow up      Assistance Recommended at Discharge Set up Supervision/Assistance  Patient can return home with the following  Assistance with cooking/housework;A little help with bathing/dressing/bathroom;A little help with walking and/or transfers    Equipment Recommendations None recommended by PT  Recommendations for Other Services       Functional Status  Assessment Patient has not had a recent decline in their functional status     Precautions / Restrictions Precautions Precautions: None Restrictions Weight Bearing Restrictions: No      Mobility  Bed Mobility Overal bed mobility: Modified Independent             General bed mobility comments: increased time    Transfers Overall transfer level: Modified independent Equipment used: None               General transfer comment: increased time    Ambulation/Gait Ambulation/Gait assistance: Supervision Gait Distance (Feet): 500 Feet Assistive device: None Gait Pattern/deviations: Decreased stride length, Decreased dorsiflexion - left, Decreased dorsiflexion - right, Narrow base of support Gait velocity: WFL     General Gait Details: Pt ambulated with supervision, no physical assist required or overt LOB noted.  Stairs            Wheelchair Mobility    Modified Rankin (Stroke Patients Only)       Balance Overall balance assessment: Independent                               Standardized Balance Assessment Standardized Balance Assessment : Dynamic Gait Index   Dynamic Gait Index Level Surface: Normal Change in Gait Speed: Normal Gait with Horizontal Head Turns: Mild Impairment Gait with Vertical Head Turns: Mild Impairment Gait and Pivot Turn: Normal Step Over Obstacle: Normal Step Around Obstacles: Normal Steps: Mild Impairment Total Score: 21       Pertinent Vitals/Pain Pain Assessment Pain Assessment: No/denies pain    Home Living Family/patient expects  to be discharged to:: Private residence Living Arrangements: Spouse/significant other Available Help at Discharge: Family;Available 24 hours/day Type of Home: House Home Access: Stairs to enter Entrance Stairs-Rails: Right Entrance Stairs-Number of Steps: 4   Home Layout: Two level;Able to live on main level with bedroom/bathroom Home Equipment: Shower seat - built  in Additional Comments: pt lives with wife and is independent    Prior Function Prior Level of Function : Independent/Modified Independent             Mobility Comments: IND ADLs Comments: IND     Hand Dominance   Dominant Hand: Right    Extremity/Trunk Assessment   Upper Extremity Assessment Upper Extremity Assessment: Overall WFL for tasks assessed;LUE deficits/detail;RUE deficits/detail RUE Deficits / Details: MMT grossly 5/5, grip strength average RUE Sensation: WNL LUE Deficits / Details: MMT grossly 5/5, grip strength average LUE Sensation: WNL    Lower Extremity Assessment Lower Extremity Assessment: Overall WFL for tasks assessed;RLE deficits/detail;LLE deficits/detail RLE Deficits / Details: MMT hip/knee/ankle 5/5, functional ROM, reduced DF RLE Sensation: WNL LLE Deficits / Details: MMT hip/knee/ankle 5/5, functional ROM, reduced DF LLE Sensation: WNL    Cervical / Trunk Assessment Cervical / Trunk Assessment: Normal  Communication   Communication: HOH  Cognition Arousal/Alertness: Awake/alert Behavior During Therapy: WFL for tasks assessed/performed Overall Cognitive Status: Within Functional Limits for tasks assessed                                          General Comments General comments (skin integrity, edema, etc.): [name] present    Exercises     Assessment/Plan    PT Assessment All further PT needs can be met in the next venue of care  PT Problem List Decreased activity tolerance;Cardiopulmonary status limiting activity       PT Treatment Interventions      PT Goals (Current goals can be found in the Care Plan section)       Frequency       Co-evaluation               AM-PAC PT "6 Clicks" Mobility  Outcome Measure Help needed turning from your back to your side while in a flat bed without using bedrails?: None Help needed moving from lying on your back to sitting on the side of a flat bed without  using bedrails?: None Help needed moving to and from a bed to a chair (including a wheelchair)?: None Help needed standing up from a chair using your arms (e.g., wheelchair or bedside chair)?: None Help needed to walk in hospital room?: A Little Help needed climbing 3-5 steps with a railing? : A Little 6 Click Score: 22    End of Session   Activity Tolerance: Patient tolerated treatment well;No increased pain Patient left: in bed;with call bell/phone within reach;with family/visitor present Nurse Communication: Mobility status PT Visit Diagnosis: Other abnormalities of gait and mobility (R26.89)    Time: 1322-1350 PT Time Calculation (min) (ACUTE ONLY): 28 min   Charges:   PT Evaluation $PT Eval Low Complexity: [place], PT, DPT [facility] Department Office: [phone] Weekend pager: [phone]  Coolidge Breeze 12/12/2022, 1:57 PM

## 2022-12-12 NOTE — Progress Notes (Signed)
PROGRESS NOTE    Lee Barnes  KYH:[id number] DOB: [birth year] DOA: 12/11/2022 PCP: Denita Lung, MD   Brief Narrative: Lee Barnes is a 72 y.o. 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  Patient takes all his medications through his PEG tube he does not take anything by mouth.  However he was trying to cough up thick phlegm that did gagging and was not able to bring up the phlegm.  He denied fever at home.  He complains of cough and shortness of breath.  His saturation was 84% on room air in the ER.  He was placed on 4 L of oxygen with improvement in his oxygenation.  He does not have oxygen at home.   He was tachycardic hypotensive and tachypneic with lactic acidosis he denies any chest pain abdominal pain diarrhea.  He lives at home with his wife.   No urinary complaints of dysuria or hematuria. In the ER he received vancomycin, cefepime, Flagyl and IV fluids. Lactic acid 2.9> 4.3> 2.6 WBC count 11 creatinine 1.13 Chest x-ray showed multifocal pneumonia Patient with history of chronic recurrent aspiration pneumonia.  Has had 5 episodes of aspiration pneumonia in the last year for them requiring hospital stay. Assessment & Plan:   Principal Problem:   SOB (shortness of breath) Active Problems:   Acute aspiration pneumonia (HCC)   Dysphagia   History of oropharyngeal cancer   Essential hypertension   Chronic hyponatremia   Chronic diarrhea   #1 sepsis patient presented with complaints of shortness of breath and cough.  He was tachypneic tachycardic hypotensive with lactic  acidosis with endorgan damage  hypoxia requiring 4 L of fluid to keep his saturation above 92%.  He was 84% on arrival to the ER on room air. Will treat him with Rocephin and azithromycin  strep pneumo negative Mrsa negative  Follow-up blood culture negative   Dc IV fluids   #2 acute hypoxic  respiratory failure secondary to multifocal pneumonia in the setting of oral cancer Nebulizer treatments IV Rocephin azithromycin O2 to keep sats above 92% COVID flu and RSV negative Taper o2  Ambulatory o2 level Did not have o2 at home  Pt eval   #3 abnormal EKG negative.  ED physician discussed with Dr. Burt Knack no new recommendations.    Estimated body mass index is 22.13 kg/m as calculated from the following:   Height as of this encounter: 6' (1.829 m).   Weight as of this encounter: 74 kg.  DVT prophylaxis: lovenox Code Status: full code Family Communication: dw wife Disposition Plan:  Status is: Inpatient Remains inpatient appropriate because: Acute hypoxia   Consultants: None  Procedures: None Antimicrobials: Rocephin and azithromycin  Subjective: Resting in bed Still with cough and shortness of breath  Objective: Vitals:   12/11/22 2350 12/12/22 0316 12/12/22 0747 12/12/22 1100  BP: (!) 148/88 (!) 158/91 (!) 173/102 (!) 163/98  Pulse: 89 86 91 88  Resp:  '19 17 15  '$ Temp:  98.7 F (37.1 C) 98.8 F (37.1 C) 99 F (37.2 C)  TempSrc:  Oral Oral Oral  SpO2:  98% 96% 99%  Weight:      Height:        Intake/Output Summary (Last 24 hours) at 12/12/2022 1135 Last data filed at 12/12/2022 0200 Gross per 24 hour  Intake 425.63 ml  Output --  Net 425.63 ml   Danley Danker  Weights   12/11/22 0827  Weight: 74 kg    Examination:  General exam: Appears calm and comfortable  Respiratory system: Coarse to auscultation. Respiratory effort normal. Cardiovascular system: S1 & S2 heard, RRR. No JVD, murmurs, rubs, gallops or clicks. No pedal edema. Gastrointestinal system: Abdomen is nondistended, soft and nontender. No organomegaly or masses felt. Normal bowel sounds heard. Central nervous system: Alert and oriented. No focal neurological deficits. Extremities: no edema   Data Reviewed: I have personally reviewed following labs and imaging studies  CBC: Recent Labs   Lab 12/11/22 0837 12/12/22 0741  WBC 11.0* 14.2*  NEUTROABS 10.3*  --   HGB 13.5 10.3*  HCT 42.1 31.9*  MCV 88.8 89.6  PLT 255 606   Basic Metabolic Panel: Recent Labs  Lab 12/11/22 0837 12/12/22 0741  NA 138 135  K 4.8 4.3  CL 103 102  CO2 26 25  GLUCOSE 104* 90  BUN 47* 29*  CREATININE 1.13 0.81  CALCIUM 9.3 8.7*   GFR: Estimated Creatinine Clearance: 87.6 mL/min (by C-G formula based on SCr of 0.81 mg/dL). Liver Function Tests: Recent Labs  Lab 12/11/22 0837 12/12/22 0741  AST 35 20  ALT 23 16  ALKPHOS 102 75  BILITOT 0.4 1.0  PROT 8.5* 6.7  ALBUMIN 3.9 3.0*   No results for input(s): "LIPASE", "AMYLASE" in the last 168 hours. No results for input(s): "AMMONIA" in the last 168 hours. Coagulation Profile: Recent Labs  Lab 12/11/22 0837  INR 1.1   Cardiac Enzymes: No results for input(s): "CKTOTAL", "CKMB", "CKMBINDEX", "TROPONINI" in the last 168 hours. BNP (last 3 results) No results for input(s): "PROBNP" in the last 8760 hours. HbA1C: No results for input(s): "HGBA1C" in the last 72 hours. CBG: No results for input(s): "GLUCAP" in the last 168 hours. Lipid Profile: No results for input(s): "CHOL", "HDL", "LDLCALC", "TRIG", "CHOLHDL", "LDLDIRECT" in the last 72 hours. Thyroid Function Tests: No results for input(s): "TSH", "T4TOTAL", "FREET4", "T3FREE", "THYROIDAB" in the last 72 hours. Anemia Panel: No results for input(s): "VITAMINB12", "FOLATE", "FERRITIN", "TIBC", "IRON", "RETICCTPCT" in the last 72 hours. Sepsis Labs: Recent Labs  Lab 12/11/22 3016 12/11/22 1151 12/11/22 1425  LATICACIDVEN 2.9* 4.3* 2.6*    Recent Results (from the past 240 hour(s))  Blood Culture (routine x 2)     Status: None (Preliminary result)   Collection Time: 12/11/22  8:37 AM   Specimen: BLOOD  Result Value Ref Range Status   Specimen Description   Final    BLOOD SITE NOT SPECIFIED Performed at [facility] [address],  [place], [place] [address]    Special Requests   Final    BOTTLES DRAWN AEROBIC AND ANAEROBIC Blood Culture results may not be optimal due to an excessive volume of blood received in culture bottles Performed at [facility] [address], [place], [place] [address]    Culture   Final    NO GROWTH < 24 HOURS Performed at [facility] [address], [place], [place] [address]    Report Status PENDING  Incomplete  Blood Culture (routine x 2)     Status: None (Preliminary result)   Collection Time: 12/11/22  8:42 AM   Specimen: BLOOD  Result Value Ref Range Status   Specimen Description   Final    BLOOD SITE NOT SPECIFIED Performed at [facility] [address] [place], [place] [address]    Special Requests   Final  BOTTLES DRAWN AEROBIC AND ANAEROBIC Blood Culture adequate volume Performed at [facility] [address] [place], [place] [address]    Culture   Final    NO GROWTH < 24 HOURS Performed at [facility] [address] [place], [place] [address]    Report Status PENDING  Incomplete  Resp panel by RT-PCR (RSV, Flu A&B, Covid) Anterior Nasal Swab     Status: None   Collection Time: 12/11/22  9:12 AM   Specimen: Anterior Nasal Swab  Result Value Ref Range Status   SARS Coronavirus 2 by RT PCR NEGATIVE NEGATIVE Final    Comment: (NOTE) SARS-CoV-2 target nucleic acids are NOT DETECTED.  The SARS-CoV-2 RNA is generally detectable in upper respiratory specimens during the acute phase of infection. The lowest concentration of SARS-CoV-2 viral copies this assay can detect is 138 copies/mL. A negative result does not preclude SARS-Cov-2 infection and should not be used as the sole basis for treatment or other patient management decisions. A negative result may occur with  improper specimen collection/handling, submission of specimen other than nasopharyngeal swab, presence of viral mutation(s) within  the areas targeted by this assay, and inadequate number of viral copies(<138 copies/mL). A negative result must be combined with clinical observations, patient history, and epidemiological information. The expected result is Negative.  Fact Sheet for Patients:  EntrepreneurPulse.com.au  Fact Sheet for Healthcare Providers:  IncredibleEmployment.be  This test is no t yet approved or cleared by the [place] FDA and  has been authorized for detection and/or diagnosis of SARS-CoV-2 by FDA under an Emergency Use Authorization (EUA). This EUA will remain  in effect (meaning this test can be used) for the duration of the COVID-19 declaration under Section 564(b)(1) of the Act, 21 U.S.C.section 360bbb-3(b)(1), unless the authorization is terminated  or revoked sooner.       Influenza A by PCR NEGATIVE NEGATIVE Final   Influenza B by PCR NEGATIVE NEGATIVE Final    Comment: (NOTE) The Xpert Xpress SARS-CoV-2/FLU/RSV plus assay is intended as an aid in the diagnosis of influenza from Nasopharyngeal swab specimens and should not be used as a sole basis for treatment. Nasal washings and aspirates are unacceptable for Xpert Xpress SARS-CoV-2/FLU/RSV testing.  Fact Sheet for Patients: EntrepreneurPulse.com.au  Fact Sheet for Healthcare Providers: IncredibleEmployment.be  This test is not yet approved or cleared by the [place] FDA and has been authorized for detection and/or diagnosis of SARS-CoV-2 by FDA under an Emergency Use Authorization (EUA). This EUA will remain in effect (meaning this test can be used) for the duration of the COVID-19 declaration under Section 564(b)(1) of the Act, 21 U.S.C. section 360bbb-3(b)(1), unless the authorization is terminated or revoked.     Resp Syncytial Virus by PCR NEGATIVE NEGATIVE Final    Comment: (NOTE) Fact Sheet for  Patients: EntrepreneurPulse.com.au  Fact Sheet for Healthcare Providers: IncredibleEmployment.be  This test is not yet approved or cleared by the [place] FDA and has been authorized for detection and/or diagnosis of SARS-CoV-2 by FDA under an Emergency Use Authorization (EUA). This EUA will remain in effect (meaning this test can be used) for the duration of the COVID-19 declaration under Section 564(b)(1) of the Act, 21 U.S.C. section 360bbb-3(b)(1), unless the authorization is terminated or revoked.  Performed at [facility], [place] [address] [place], [place] [address]   Urine Culture     Status: None   Collection Time: 12/11/22 10:37 AM   Specimen: In/Out Cath  Urine  Result Value Ref Range Status   Specimen Description   Final    IN/OUT CATH URINE Performed at [facility], [place] [address] [place], [place] [address]    Special Requests   Final    NONE Performed at [facility], [place] [address] [place], [place] [address]    Culture   Final    NO GROWTH Performed at [facility], [place] [address] [place], [place] [address]    Report Status 12/12/2022 FINAL  Final  Expectorated Sputum Assessment w Gram Stain, Rflx to Resp Cult     Status: None   Collection Time: 12/12/22 10:05 AM   Specimen: Expectorated Sputum  Result Value Ref Range Status   Specimen Description EXPECTORATED SPUTUM  Final   Special Requests NONE  Final   Sputum evaluation   Final    THIS SPECIMEN IS ACCEPTABLE FOR SPUTUM CULTURE Performed at [facility], [place] [address] [place], [place] [address]    Report Status 12/12/2022 FINAL  Final         Radiology Studies: DG Chest Port 1 View  Result Date: 12/11/2022 CLINICAL DATA:  Sepsis, shortness of breath EXAM: PORTABLE CHEST 1 VIEW COMPARISON:  11/12/2022 FINDINGS: Stable heart size. Chronic elevation of the right  hemidiaphragm. Dense airspace consolidation within the periphery of the left mid lung. Additional airspace opacity at the right lung base. No pleural effusion or pneumothorax. IMPRESSION: Airspace consolidations within the left mid lung and right lung base, most compatible with multifocal pneumonia. Electronically Signed   By: Davina Poke D.O.   On: 12/11/2022 09:08        Scheduled Meds:  enoxaparin (LOVENOX) injection  40 mg Subcutaneous Q24H   famotidine  20 mg Per Tube BID   feeding supplement (OSMOLITE 1.5 CAL)  1,425 mL Per Tube Once   [START ON 12/13/2022] feeding supplement (OSMOLITE 1.5 CAL)  1,425 mL Per Tube Q24H   loratadine  10 mg Oral Daily   Continuous Infusions:  ampicillin-sulbactam (UNASYN) IV 3 g (12/12/22 0522)     LOS: 1 day    Time spent:39 min  Georgette Shell, MD  12/12/2022, 11:35 AM

## 2022-12-13 ENCOUNTER — Other Ambulatory Visit (HOSPITAL_COMMUNITY): Payer: Self-pay

## 2022-12-13 DIAGNOSIS — R0602 Shortness of breath: Secondary | ICD-10-CM | POA: Diagnosis not present

## 2022-12-13 LAB — CBC
HCT: 33.8 % — ABNORMAL LOW (ref 39.0–52.0)
Hemoglobin: 10.6 g/dL — ABNORMAL LOW (ref 13.0–17.0)
MCH: 27.9 pg (ref 26.0–34.0)
MCHC: 31.4 g/dL (ref 30.0–36.0)
MCV: 88.9 fL (ref 80.0–100.0)
Platelets: 209 10*3/uL (ref 150–400)
RBC: 3.8 MIL/uL — ABNORMAL LOW (ref 4.22–5.81)
RDW: 15.1 % (ref 11.5–15.5)
WBC: 12.2 10*3/uL — ABNORMAL HIGH (ref 4.0–10.5)
nRBC: 0 % (ref 0.0–0.2)

## 2022-12-13 LAB — LEGIONELLA PNEUMOPHILA SEROGP 1 UR AG: L. pneumophila Serogp 1 Ur Ag: NEGATIVE

## 2022-12-13 MED ORDER — AMOXICILLIN-POT CLAVULANATE 250-62.5 MG/5ML PO SUSR
250.0000 mg | Freq: Two times a day (BID) | ORAL | 0 refills | Status: AC
  Filled 2022-12-13 (×2): qty 100, 10d supply, fill #0

## 2022-12-13 MED ORDER — ONDANSETRON HCL 4 MG PO TABS
4.0000 mg | ORAL_TABLET | Freq: Four times a day (QID) | ORAL | 1 refills | Status: DC | PRN
  Filled 2022-12-13: qty 30, 8d supply, fill #0

## 2022-12-13 NOTE — Plan of Care (Signed)
  Problem: Activity: ?Goal: Ability to tolerate increased activity will improve ?Outcome: Adequate for Discharge ?  ?Problem: Clinical Measurements: ?Goal: Ability to maintain a body temperature in the normal range will improve ?Outcome: Adequate for Discharge ?  ?Problem: Respiratory: ?Goal: Ability to maintain adequate ventilation will improve ?Outcome: Adequate for Discharge ?Goal: Ability to maintain a clear airway will improve ?Outcome: Adequate for Discharge ?  ?Problem: Education: ?Goal: Knowledge of General Education information will improve ?Description: Including pain rating scale, medication(s)/side effects and non-pharmacologic comfort measures ?Outcome: Adequate for Discharge ?  ?Problem: Health Behavior/Discharge Planning: ?Goal: Ability to manage health-related needs will improve ?Outcome: Adequate for Discharge ?  ?Problem: Clinical Measurements: ?Goal: Ability to maintain clinical measurements within normal limits will improve ?Outcome: Adequate for Discharge ?Goal: Will remain free from infection ?Outcome: Adequate for Discharge ?Goal: Diagnostic test results will improve ?Outcome: Adequate for Discharge ?Goal: Respiratory complications will improve ?Outcome: Adequate for Discharge ?Goal: Cardiovascular complication will be avoided ?Outcome: Adequate for Discharge ?  ?Problem: Activity: ?Goal: Risk for activity intolerance will decrease ?Outcome: Adequate for Discharge ?  ?Problem: Nutrition: ?Goal: Adequate nutrition will be maintained ?Outcome: Adequate for Discharge ?  ?Problem: Coping: ?Goal: Level of anxiety will decrease ?Outcome: Adequate for Discharge ?  ?Problem: Elimination: ?Goal: Will not experience complications related to bowel motility ?Outcome: Adequate for Discharge ?Goal: Will not experience complications related to urinary retention ?Outcome: Adequate for Discharge ?  ?Problem: Pain Managment: ?Goal: General experience of comfort will improve ?Outcome: Adequate for Discharge ?   ?Problem: Safety: ?Goal: Ability to remain free from injury will improve ?Outcome: Adequate for Discharge ?  ?Problem: Skin Integrity: ?Goal: Risk for impaired skin integrity will decrease ?Outcome: Adequate for Discharge ?  ?

## 2022-12-13 NOTE — TOC Transition Note (Signed)
Transition of Care [facility]) - CM/SW Discharge Note   Patient Details  Name: Lee Barnes MRN: [id number] Date of Birth: [birth year]  [facility]) CM/SW Contact:  Dessa Phi, RN Phone Number: 12/13/2022, 11:57 AM   Clinical Narrative: Noted PT no f/u. Spoke to patient/[name] dtr in rm about home 02-[employer] will contact dtr about private pay for home 02;also overnight oximetry on ra-ordered. Has own transport home. No further CM needs.      Final next level of care: Home/Self Care Barriers to Discharge: No Barriers Identified   Patient Goals and CMS Choice CMS Medicare.gov Compare Post Acute Care list provided to:: Patient Represenative (must comment) Choice offered to / list presented to : Spouse  Discharge Placement                         Discharge Plan and Services Additional resources added to the After Visit Summary for                                       Social Determinants of Health (SDOH) Interventions SDOH Screenings   Food Insecurity: No Food Insecurity (12/11/2022)  Housing: Low Risk  (12/11/2022)  Transportation Needs: No Transportation Needs (12/11/2022)  Utilities: Not At Risk (12/11/2022)  Depression (PHQ2-9): Low Risk  (11/01/2022)  Financial Resource Strain: Low Risk  (11/01/2022)  Physical Activity: Inactive (11/01/2022)  Stress: No Stress Concern Present (11/01/2022)  Tobacco Use: Low Risk  (12/12/2022)     Readmission Risk Interventions    12/12/2022    2:04 PM  Readmission Risk Prevention Plan  Transportation Screening Complete  PCP or Specialist Appt within 3-5 Days Complete  HRI or [place] Complete  Social Work Consult for [place] Planning/Counseling Complete  Palliative Care Screening Complete

## 2022-12-13 NOTE — Discharge Summary (Signed)
Physician Discharge Summary  Lee Barnes LPF:[id number] DOB: 12-23-1950 DOA: 12/11/2022  PCP: Denita Lung, MD  Admit date: 12/11/2022 Discharge date: 12/13/2022  Admitted From: Home Disposition:  home  Recommendations for Outpatient Follow-up:  Follow up with PCP in 1-2 weeks Please obtain BMP/CBC in one week  Home Health: None Equipment/Devices: Oxygen at night Discharge Condition: Stable CODE STATUS: Full code Diet recommendation: N.p.o. Brief/Interim Summary: Lee Barnes is a 72 y.o. 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  Patient takes all his medications through his PEG tube he does not take anything by mouth.  However he was trying to cough up thick phlegm that did gagging and was not able to bring up the phlegm.  He denied fever at home.  He complains of cough and shortness of breath.  His saturation was 84% on room air in the ER.  He was placed on 4 L of oxygen with improvement in his oxygenation.  He does not have oxygen at home.   He was tachycardic hypotensive and tachypneic with lactic acidosis he denies any chest pain abdominal pain diarrhea.  He lives at home with his wife.   No urinary complaints of dysuria or hematuria. In the ER he received vancomycin, cefepime, Flagyl and IV fluids. Lactic acid 2.9> 4.3> 2.6 WBC count 11 creatinine 1.13 Chest x-ray showed multifocal pneumonia Patient with history of chronic recurrent aspiration pneumonia.  Has had 5 episodes of aspiration pneumonia in the last year for them requiring hospital stay.  Discharge Diagnoses:  Principal Problem:   SOB (shortness of breath) Active Problems:   Acute aspiration pneumonia (HCC)   Dysphagia   History of oropharyngeal cancer   Essential hypertension   Chronic hyponatremia   Chronic diarrhea    #1 sepsis patient presented with complaints of shortness of breath and cough.   He was tachypneic tachycardic hypotensive with lactic  acidosis with endorgan damage  hypoxia requiring 4 L of fluid to keep his saturation above 92%.  He was 84% on arrival to the ER on room air. treated him with Rocephin and azithromycin and discharged on augmentin  strep pneumo negative Mrsa negative  blood culture negative  Leukocytosis improved prior to dc    #2 acute hypoxic respiratory failure secondary to multifocal pneumonia in the setting of oral cancer COVID flu and RSV negative Oxygen saturation on room air was above 92% on discharge. Oxygen at 2 L was ordered for night and while choking.   #3 abnormal EKG negative.  ED physician discussed with Dr. Burt Knack no new recommendations were made.   Estimated body mass index is 22.13 kg/m as calculated from the following:   Height as of this encounter: 6' (1.829 m).   Weight as of this encounter: 74 kg.  Discharge Instructions  Discharge Instructions     Diet - low sodium heart healthy   Complete by: As directed    For home use only DME oxygen   Complete by: As directed    Length of Need: Lifetime   Mode or (Route): Nasal cannula   Liters per Minute: 2   Oxygen delivery system: Gas   Increase activity slowly   Complete by: As directed       Allergies as of 12/13/2022       Reactions   Codeine Nausea And Vomiting        Medication List  STOP taking these medications    midodrine 2.5 MG tablet Commonly known as: PROAMATINE       TAKE these medications    albuterol (2.5 MG/3ML) 0.083% nebulizer solution Commonly known as: PROVENTIL Take 3 mLs (2.5 mg total) by nebulization every 4 (four) hours as needed for wheezing.   amoxicillin-clavulanate 250-62.5 MG/5ML suspension Commonly known as: AUGMENTIN Take 5 mLs (250 mg total) by mouth 2 (two) times daily for 10 days.   cetirizine 10 MG chewable tablet Commonly known as: ZYRTEC Place 10 mg into feeding tube daily as needed for allergies or  rhinitis.   feeding supplement (OSMOLITE 1.5 CAL) Liqd Place 1,425 mLs into feeding tube daily.   FLORASTOR PO Take 250 mg by mouth daily as needed (GI irritation).   fluticasone 50 MCG/ACT nasal spray Commonly known as: FLONASE SPRAY TWO SPRAYS IN EACH NOSTRIL ONCE DAILY AS NEEDED What changed:  how much to take how to take this when to take this additional instructions   guaiFENesin 100 MG/5ML liquid Commonly known as: ROBITUSSIN Place 15 mLs into feeding tube every 6 (six) hours as needed for cough or to loosen phlegm.   loperamide 2 MG capsule Commonly known as: IMODIUM 2 mg See admin instructions. Place 2 mg into feeding tube daily as needed for diarrhea. Usually given during a feeding.   omeprazole 40 MG capsule Commonly known as: PRILOSEC Removed the granules from the capsule and place him in the G-tube daily. What changed:  how much to take when to take this additional instructions   ondansetron 4 MG tablet Commonly known as: ZOFRAN Take 1 tablet (4 mg total) by mouth every 6 (six) hours as needed for nausea or vomiting. What changed: when to take this   Pedialyte Soln Place 355 mLs into feeding tube daily.   Systane Ultra PF 0.4-0.3 % Soln Generic drug: Polyethyl Glyc-Propyl Glyc PF Place 1 drop into both eyes daily as needed (for irritation).               Durable Medical Equipment  (From admission, onward)           Start     Ordered   12/13/22 1128  For home use only DME Other see comment  Once       Comments: Overnight oximetry on ra;pulse ox for night.  Question:  Length of Need  Answer:  Lifetime   12/13/22 1130   12/13/22 0000  For home use only DME oxygen       Question Answer Comment  Length of Need Lifetime   Mode or (Route) Nasal cannula   Liters per Minute 2   Oxygen delivery system Gas      12/13/22 1214            Follow-up Information     [facility] Follow up.   Why: oxygen Contact information: [address] [place] [address] [phone]                Allergies  Allergen Reactions   Codeine Nausea And Vomiting    Consultations: none   Procedures/Studies: DG Chest Port 1 View  Result Date: 12/11/2022 CLINICAL DATA:  Sepsis, shortness of breath EXAM: PORTABLE CHEST 1 VIEW COMPARISON:  11/12/2022 FINDINGS: Stable heart size. Chronic elevation of the right hemidiaphragm. Dense airspace consolidation within the periphery of the left mid lung. Additional airspace opacity at the right lung base. No pleural effusion or pneumothorax. IMPRESSION: Airspace consolidations within the left  mid lung and right lung base, most compatible with multifocal pneumonia. Electronically Signed   By: Davina Poke D.O.   On: 12/11/2022 09:08   (Echo, Carotid, EGD, Colonoscopy, ERCP)    Subjective:  Patient is resting in bed he reports he had a better night breathing is better Discharge Exam: Vitals:   12/13/22 0848 12/13/22 1057  BP:    Pulse:    Resp:    Temp:    SpO2: 96% 98%   Vitals:   12/12/22 2318 12/13/22 0655 12/13/22 0848 12/13/22 1057  BP: 108/63 (!) 158/94    Pulse: 96 97    Resp:  18    Temp:  99.1 F (37.3 C)    TempSrc:  Oral    SpO2:  96% 96% 98%  Weight:      Height:        General: Pt is alert, awake, not in acute distress Cardiovascular: RRR, S1/S2 +, no rubs, no gallops Respiratory: Rhonchi  bilaterally, no wheezing, no rhonchi Abdominal: Soft, NT, ND, bowel sounds + Extremities: no edema, no cyanosis    The results of significant diagnostics from this hospitalization (including imaging, microbiology, ancillary and laboratory) are listed below for reference.     Microbiology: Recent Results (from the past 240 hour(s))  Blood Culture (routine x 2)     Status: None (Preliminary result)   Collection Time: 12/11/22  8:37 AM   Specimen: BLOOD  Result Value Ref Range Status   Specimen Description   Final    BLOOD SITE NOT  SPECIFIED Performed at [facility] [address], [place], [place] [address]    Special Requests   Final    BOTTLES DRAWN AEROBIC AND ANAEROBIC Blood Culture results may not be optimal due to an excessive volume of blood received in culture bottles Performed at [facility] [address], [place], [place] [address]    Culture   Final    NO GROWTH 2 DAYS Performed at [facility] [address] [place], [place] [address]    Report Status PENDING  Incomplete  Blood Culture (routine x 2)     Status: None (Preliminary result)   Collection Time: 12/11/22  8:42 AM   Specimen: BLOOD  Result Value Ref Range Status   Specimen Description   Final    BLOOD SITE NOT SPECIFIED Performed at [facility] [address] [place], [place] [address]    Special Requests   Final    BOTTLES DRAWN AEROBIC AND ANAEROBIC Blood Culture adequate volume Performed at [facility] [address], [place], [place] [address]    Culture   Final    NO GROWTH 2 DAYS Performed at [facility] [address] [place],  [address]    Report Status PENDING  Incomplete  Resp panel by RT-PCR (RSV, Flu A&B, Covid) Anterior Nasal Swab     Status: None   Collection Time: 12/11/22  9:12 AM   Specimen: Anterior Nasal Swab  Result Value Ref Range Status   SARS Coronavirus 2 by RT PCR NEGATIVE NEGATIVE Final    Comment: (NOTE) SARS-CoV-2 target nucleic acids are NOT DETECTED.  The SARS-CoV-2 RNA is generally detectable in upper respiratory specimens during the acute phase of infection. The lowest concentration of SARS-CoV-2 viral copies this assay can detect is 138 copies/mL. A negative result does not preclude SARS-Cov-2 infection and should not be used as the sole basis for treatment  or other patient management decisions. A negative result may occur with  improper specimen collection/handling, submission of specimen  other than nasopharyngeal swab, presence of viral mutation(s) within the areas targeted by this assay, and inadequate number of viral copies(<138 copies/mL). A negative result must be combined with clinical observations, patient history, and epidemiological information. The expected result is Negative.  Fact Sheet for Patients:  EntrepreneurPulse.com.au  Fact Sheet for Healthcare Providers:  IncredibleEmployment.be  This test is no t yet approved or cleared by the [place] FDA and  has been authorized for detection and/or diagnosis of SARS-CoV-2 by FDA under an Emergency Use Authorization (EUA). This EUA will remain  in effect (meaning this test can be used) for the duration of the COVID-19 declaration under Section 564(b)(1) of the Act, 21 U.S.C.section 360bbb-3(b)(1), unless the authorization is terminated  or revoked sooner.       Influenza A by PCR NEGATIVE NEGATIVE Final   Influenza B by PCR NEGATIVE NEGATIVE Final    Comment: (NOTE) The Xpert Xpress SARS-CoV-2/FLU/RSV plus assay is intended as an aid in the diagnosis of influenza from Nasopharyngeal swab specimens and should not be used as a sole basis for treatment. Nasal washings and aspirates are unacceptable for Xpert Xpress SARS-CoV-2/FLU/RSV testing.  Fact Sheet for Patients: EntrepreneurPulse.com.au  Fact Sheet for Healthcare Providers: IncredibleEmployment.be  This test is not yet approved or cleared by the [place] FDA and has been authorized for detection and/or diagnosis of SARS-CoV-2 by FDA under an Emergency Use Authorization (EUA). This EUA will remain in effect (meaning this test can be used) for the duration of the COVID-19 declaration under Section 564(b)(1) of the Act, 21 U.S.C. section 360bbb-3(b)(1), unless the authorization is terminated or revoked.     Resp Syncytial Virus by PCR NEGATIVE NEGATIVE Final     Comment: (NOTE) Fact Sheet for Patients: EntrepreneurPulse.com.au  Fact Sheet for Healthcare Providers: IncredibleEmployment.be  This test is not yet approved or cleared by the [place] FDA and has been authorized for detection and/or diagnosis of SARS-CoV-2 by FDA under an Emergency Use Authorization (EUA). This EUA will remain in effect (meaning this test can be used) for the duration of the COVID-19 declaration under Section 564(b)(1) of the Act, 21 U.S.C. section 360bbb-3(b)(1), unless the authorization is terminated or revoked.  Performed at [facility], [place] [address], [place], [place] [address]   Urine Culture     Status: None   Collection Time: 12/11/22 10:37 AM   Specimen: In/Out Cath Urine  Result Value Ref Range Status   Specimen Description   Final    IN/OUT CATH URINE Performed at [facility] [address], [place], [place] [address]    Special Requests   Final    NONE Performed at [facility], [place] [address], [place],  [address]    Culture   Final    NO GROWTH Performed at [facility], [place] [address], [place],  [address]    Report Status 12/12/2022 FINAL  Final  MRSA Next Gen by PCR, Nasal     Status: None   Collection Time: 12/12/22 10:02 AM   Specimen: Nasal Mucosa; Nasal Swab  Result Value Ref Range Status   MRSA by PCR Next Gen NOT DETECTED NOT DETECTED Final    Comment: (NOTE) The GeneXpert MRSA Assay (FDA approved for NASAL specimens only), is one component of a comprehensive MRSA colonization surveillance program. It is not intended to diagnose MRSA  infection nor to guide or monitor treatment for MRSA infections. Test performance is not FDA approved in patients less than 44 years old. Performed at [facility], [place] [address] [place], [place] [address]   Expectorated Sputum Assessment w Gram Stain, Rflx to  Resp Cult     Status: None   Collection Time: 12/12/22 10:05 AM   Specimen: Expectorated Sputum  Result Value Ref Range Status   Specimen Description EXPECTORATED SPUTUM  Final   Special Requests NONE  Final   Sputum evaluation   Final    THIS SPECIMEN IS ACCEPTABLE FOR SPUTUM CULTURE Performed at [facility], [place] [address] [place], [place] [address]    Report Status 12/12/2022 FINAL  Final  Culture, Respiratory w Gram Stain     Status: None (Preliminary result)   Collection Time: 12/12/22 10:05 AM  Result Value Ref Range Status   Specimen Description   Final    EXPECTORATED SPUTUM Performed at [facility] [address], [place], [place] [address]    Special Requests   Final    NONE Reflexed from [id number] Performed at [facility] [address] [place], [place] [address]    Gram Stain   Final    ABUNDANT SQUAMOUS EPITHELIAL CELLS PRESENT NO WBC SEEN RARE BUDDING YEAST SEEN    Culture   Final    CULTURE REINCUBATED FOR BETTER GROWTH Performed at [facility], [place] [address] [place], [place] [address]    Report Status PENDING  Incomplete     Labs: BNP (last 3 results) Recent Labs    10/06/22 0620  BNP 67.5   Basic Metabolic Panel: Recent Labs  Lab 12/11/22 0837 12/12/22 0741  NA 138 135  K 4.8 4.3  CL 103 102  CO2 26 25  GLUCOSE 104* 90  BUN 47* 29*  CREATININE 1.13 0.81  CALCIUM 9.3 8.7*   Liver Function Tests: Recent Labs  Lab 12/11/22 0837 12/12/22 0741  AST 35 20  ALT 23 16  ALKPHOS 102 75  BILITOT 0.4 1.0  PROT 8.5* 6.7  ALBUMIN 3.9 3.0*   No results for input(s): "LIPASE", "AMYLASE" in the last 168 hours. No results for input(s): "AMMONIA" in the last 168 hours. CBC: Recent Labs  Lab 12/11/22 0837 12/12/22 0741 12/13/22 1037  WBC 11.0* 14.2* 12.2*  NEUTROABS 10.3*  --   --   HGB 13.5 10.3* 10.6*  HCT 42.1 31.9* 33.8*  MCV 88.8 89.6 88.9  PLT 255 177 209    Cardiac Enzymes: No results for input(s): "CKTOTAL", "CKMB", "CKMBINDEX", "TROPONINI" in the last 168 hours. BNP: Invalid input(s): "POCBNP" CBG: No results for input(s): "GLUCAP" in the last 168 hours. D-Dimer No results for input(s): "DDIMER" in the last 72 hours. Hgb A1c No results for input(s): "HGBA1C" in the last 72 hours. Lipid Profile No results for input(s): "CHOL", "HDL", "LDLCALC", "TRIG", "CHOLHDL", "LDLDIRECT" in the last 72 hours. Thyroid function studies No results for input(s): "TSH", "T4TOTAL", "T3FREE", "THYROIDAB" in the last 72 hours.  Invalid input(s): "FREET3" Anemia work up No results for input(s): "VITAMINB12", "FOLATE", "FERRITIN", "TIBC", "IRON", "RETICCTPCT" in the last 72 hours. Urinalysis    Component Value Date/Time   COLORURINE YELLOW 12/11/2022 1037   APPEARANCEUR CLEAR 12/11/2022 1037   LABSPEC 1.020 12/11/2022 1037   LABSPEC 1.015 12/13/2010 1028   PHURINE 7.0 12/11/2022 1037   GLUCOSEU NEGATIVE 12/11/2022 1037   HGBUR NEGATIVE 12/11/2022 [place] 12/11/2022  [place] 02/19/2016 1519   BILIRUBINUR Negative 12/13/2010 1028   KETONESUR NEGATIVE 12/11/2022 1037   PROTEINUR NEGATIVE 12/11/2022 1037   UROBILINOGEN negative 02/19/2016 1519   UROBILINOGEN 0.2 04/01/2015 2316   NITRITE NEGATIVE 12/11/2022 1037   LEUKOCYTESUR NEGATIVE 12/11/2022 1037   LEUKOCYTESUR Negative 12/13/2010 1028   Sepsis Labs Recent Labs  Lab 12/11/22 0837 12/12/22 0741 12/13/22 1037  WBC 11.0* 14.2* 12.2*   Microbiology Recent Results (from the past 240 hour(s))  Blood Culture (routine x 2)     Status: None (Preliminary result)   Collection Time: 12/11/22  8:37 AM   Specimen: BLOOD  Result Value Ref Range Status   Specimen Description   Final    BLOOD SITE NOT SPECIFIED Performed at [facility] [address] [place], [place] [address]    Special Requests   Final    BOTTLES DRAWN AEROBIC AND ANAEROBIC  Blood Culture results may not be optimal due to an excessive volume of blood received in culture bottles Performed at [facility] [address] [place], [place] [address]    Culture   Final    NO GROWTH 2 DAYS Performed at [facility] [address] [place], [place] [address]    Report Status PENDING  Incomplete  Blood Culture (routine x 2)     Status: None (Preliminary result)   Collection Time: 12/11/22  8:42 AM   Specimen: BLOOD  Result Value Ref Range Status   Specimen Description   Final    BLOOD SITE NOT SPECIFIED Performed at [facility] [address], [place], [place] [address]    Special Requests   Final    BOTTLES DRAWN AEROBIC AND ANAEROBIC Blood Culture adequate volume Performed at [facility] [address] [place], [place] [address]    Culture   Final    NO GROWTH 2 DAYS Performed at [facility] [address], [place], [place] [address]    Report Status PENDING  Incomplete  Resp panel by RT-PCR (RSV, Flu A&B, Covid) Anterior Nasal Swab     Status: None   Collection Time: 12/11/22  9:12 AM   Specimen: Anterior Nasal Swab  Result Value Ref Range Status   SARS Coronavirus 2 by RT PCR NEGATIVE NEGATIVE Final    Comment: (NOTE) SARS-CoV-2 target nucleic acids are NOT DETECTED.  The SARS-CoV-2 RNA is generally detectable in upper respiratory specimens during the acute phase of infection. The lowest concentration of SARS-CoV-2 viral copies this assay can detect is 138 copies/mL. A negative result does not preclude SARS-Cov-2 infection and should not be used as the sole basis for treatment or other patient management decisions. A negative result may occur with  improper specimen collection/handling, submission of specimen other than nasopharyngeal swab, presence of viral mutation(s) within the areas targeted by this assay, and inadequate number of viral copies(<138 copies/mL). A  negative result must be combined with clinical observations, patient history, and epidemiological information. The expected result is Negative.  Fact Sheet for Patients:  EntrepreneurPulse.com.au  Fact Sheet for Healthcare Providers:  IncredibleEmployment.be  This test is no t yet approved or cleared by the [place] FDA and  has been authorized for detection and/or diagnosis of SARS-CoV-2 by FDA under an Emergency Use Authorization (EUA). This EUA will remain  in effect (meaning this test can be used) for the duration of the COVID-19 declaration under Section 564(b)(1) of the Act, 21  U.S.C.section 360bbb-3(b)(1), unless the authorization is terminated  or revoked sooner.       Influenza A by PCR NEGATIVE NEGATIVE Final   Influenza B by PCR NEGATIVE NEGATIVE Final    Comment: (NOTE) The Xpert Xpress SARS-CoV-2/FLU/RSV plus assay is intended as an aid in the diagnosis of influenza from Nasopharyngeal swab specimens and should not be used as a sole basis for treatment. Nasal washings and aspirates are unacceptable for Xpert Xpress SARS-CoV-2/FLU/RSV testing.  Fact Sheet for Patients: EntrepreneurPulse.com.au  Fact Sheet for Healthcare Providers: IncredibleEmployment.be  This test is not yet approved or cleared by the [place] FDA and has been authorized for detection and/or diagnosis of SARS-CoV-2 by FDA under an Emergency Use Authorization (EUA). This EUA will remain in effect (meaning this test can be used) for the duration of the COVID-19 declaration under Section 564(b)(1) of the Act, 21 U.S.C. section 360bbb-3(b)(1), unless the authorization is terminated or revoked.     Resp Syncytial Virus by PCR NEGATIVE NEGATIVE Final    Comment: (NOTE) Fact Sheet for Patients: EntrepreneurPulse.com.au  Fact Sheet for Healthcare  Providers: IncredibleEmployment.be  This test is not yet approved or cleared by the [place] FDA and has been authorized for detection and/or diagnosis of SARS-CoV-2 by FDA under an Emergency Use Authorization (EUA). This EUA will remain in effect (meaning this test can be used) for the duration of the COVID-19 declaration under Section 564(b)(1) of the Act, 21 U.S.C. section 360bbb-3(b)(1), unless the authorization is terminated or revoked.  Performed at [facility], [place] [address] [place], [place] [address]   Urine Culture     Status: None   Collection Time: 12/11/22 10:37 AM   Specimen: In/Out Cath Urine  Result Value Ref Range Status   Specimen Description   Final    IN/OUT CATH URINE Performed at [facility] [address] [place], [place] [address]    Special Requests   Final    NONE Performed at [facility], [place] [address], [place], [place] [address]    Culture   Final    NO GROWTH Performed at [facility], [place] [address], [place], [place] [address]    Report Status 12/12/2022 FINAL  Final  MRSA Next Gen by PCR, Nasal     Status: None   Collection Time: 12/12/22 10:02 AM   Specimen: Nasal Mucosa; Nasal Swab  Result Value Ref Range Status   MRSA by PCR Next Gen NOT DETECTED NOT DETECTED Final    Comment: (NOTE) The GeneXpert MRSA Assay (FDA approved for NASAL specimens only), is one component of a comprehensive MRSA colonization surveillance program. It is not intended to diagnose MRSA infection nor to guide or monitor treatment for MRSA infections. Test performance is not FDA approved in patients less than 42 years old. Performed at [facility], [place] [address], [place], [place] [address]   Expectorated Sputum Assessment w Gram Stain, Rflx to Resp Cult     Status: None   Collection Time: 12/12/22 10:05 AM   Specimen: Expectorated Sputum  Result Value  Ref Range Status   Specimen Description EXPECTORATED SPUTUM  Final   Special Requests NONE  Final   Sputum evaluation   Final    THIS SPECIMEN IS ACCEPTABLE FOR SPUTUM CULTURE Performed at [facility], [place] [address], [place], [place] [address]    Report Status 12/12/2022 FINAL  Final  Culture, Respiratory w Gram Stain  Status: None (Preliminary result)   Collection Time: 12/12/22 10:05 AM  Result Value Ref Range Status   Specimen Description   Final    EXPECTORATED SPUTUM Performed at [facility] [address], [place], [place] [address]    Special Requests   Final    NONE Reflexed from [id number] Performed at [facility] [address] [place], [place] [address]    Gram Stain   Final    ABUNDANT SQUAMOUS EPITHELIAL CELLS PRESENT NO WBC SEEN RARE BUDDING YEAST SEEN    Culture   Final    CULTURE REINCUBATED FOR BETTER GROWTH Performed at [facility], [place] [address] [place], [place] [address]    Report Status PENDING  Incomplete     Time coordinating discharge: 39 minutes  SIGNED  Georgette Shell, MD  [facility] 12/13/2022, 3:11 PM

## 2022-12-13 NOTE — Plan of Care (Signed)
  Problem: Respiratory: Goal: Ability to maintain adequate ventilation will improve Outcome: Progressing   Problem: Clinical Measurements: Goal: Ability to maintain clinical measurements within normal limits will improve Outcome: Progressing Goal: Respiratory complications will improve Outcome: Progressing

## 2022-12-13 NOTE — Progress Notes (Signed)
Pt had elevated BP 167/98 HR 101. Provider notified. New orders received for 5 mg apresoline IV and administered. Reassessed BP 108/63 HR 96.

## 2022-12-14 LAB — CULTURE, RESPIRATORY W GRAM STAIN: Culture: NORMAL

## 2022-12-16 ENCOUNTER — Telehealth: Payer: Self-pay

## 2022-12-16 LAB — CULTURE, BLOOD (ROUTINE X 2)
Culture: NO GROWTH
Culture: NO GROWTH
Special Requests: ADEQUATE

## 2022-12-16 NOTE — Patient Outreach (Signed)
  Care Coordination TOC Note Transition Care Management Unsuccessful Follow-up Telephone Call  Date of discharge and from where:  12/13/22-[facility]   Attempts:  1st Attempt  Reason for unsuccessful TCM follow-up call:  Left voice message    [facility] [place] Management Telephonic Care Management Coordinator Direct Phone: [phone] Toll Free: [phone] Fax: [phone]

## 2022-12-17 ENCOUNTER — Telehealth: Payer: Self-pay

## 2022-12-17 NOTE — Patient Outreach (Signed)
  Care Coordination TOC Note Transition Care Management Follow-up Telephone Call Date of discharge and from where: 12/13/22-[facility]  Dx: "sepsis w/ acute hypoxic resp failure" How have you been since you were released from the hospital? Call completed with spouse. She voices that patient doing fair. He has been having some coughing spells and because of weakened muscles difficulty coughing up phlegm. Provided some suggestions to help assist. Patient getting feeding tube for nutrition and having some occasional diarrhea which she is medicating for.  Any questions or concerns? No  Items Reviewed: Did the pt receive and understand the discharge instructions provided? Yes  Medications obtained and verified?  Spouse leaving her MD appt-unable to or review meds  Other? No  Any new allergies since your discharge? No  Dietary orders reviewed? Yes Do you have support at home? Yes -Spouse voices it is just her managing his care needs-three daughters live out of town she denies needling further support/assistance at this time  [place] and Equipment/Supplies: Were home health services ordered? yes If so, what is the name of the agency? [facility]  Has the agency set up a time to come to the patient's home? Spouse thinks agency tried to call patient on his phone which he is not using/answering-She will call agency today to set up appt Were any new equipment or medical supplies ordered?  Yes: oxygen,pulse ox, PEG What is the name of the medical supply agency? [employer] Were you able to get the supplies/equipment? yes Do you have any questions related to the use of the equipment or supplies? No  Functional Questionnaire: (I = Independent and D = Dependent) ADLs: A  Bathing/Dressing- A  Meal Prep- A  Eating- A  Maintaining continence- A  Transferring/Ambulation- A  Managing Meds- A  Follow up appointments reviewed:  PCP Hospital f/u appt confirmed? Yes  Scheduled to see Dr. Redmond School on  12/18/22 @ 11:45 am. [facility] f/u appt confirmed?  N/A Are transportation arrangements needed? No  If their condition worsens, is the pt aware to call PCP or go to the Emergency Dept.? Yes Was the patient provided with contact information for the PCP's office or ED? Yes Was to pt encouraged to call back with questions or concerns? Yes  SDOH assessments and interventions completed:   Yes SDOH Interventions Today    Flowsheet Row Most Recent Value  SDOH Interventions   Food Insecurity Interventions Intervention Not Indicated  Transportation Interventions Intervention Not Indicated       Care Coordination Interventions:  Interventions Today    Flowsheet Row Most Recent Value  Education Interventions   Education Provided Provided Verbal Education  Provided Verbal Education On Nutrition, Medication, When to see the doctor  [resp sx mgmt]  Nutrition Interventions   Nutrition Discussed/Reviewed Nutrition Discussed  [feeding tube mgmt]       TOC Interventions Today    Flowsheet Row Most Recent Value  TOC Interventions   TOC Interventions Discussed/Reviewed TOC Interventions Discussed  [PEG tube mgmt, resp sx mgmt]         Encounter Outcome:  Pt. Visit Completed    Enzo Montgomery, RN,BSN,CCM [place] Management Telephonic Care Management Coordinator Direct Phone: [phone] Toll Free: [phone] Fax: [phone]

## 2022-12-18 ENCOUNTER — Ambulatory Visit (INDEPENDENT_AMBULATORY_CARE_PROVIDER_SITE_OTHER): Payer: Medicare Other | Admitting: Family Medicine

## 2022-12-18 ENCOUNTER — Encounter: Payer: Self-pay | Admitting: Family Medicine

## 2022-12-18 VITALS — BP 128/72 | HR 93 | Wt 164.0 lb

## 2022-12-18 DIAGNOSIS — Z85819 Personal history of malignant neoplasm of unspecified site of lip, oral cavity, and pharynx: Secondary | ICD-10-CM

## 2022-12-18 DIAGNOSIS — J690 Pneumonitis due to inhalation of food and vomit: Secondary | ICD-10-CM | POA: Diagnosis not present

## 2022-12-18 DIAGNOSIS — E871 Hypo-osmolality and hyponatremia: Secondary | ICD-10-CM | POA: Diagnosis not present

## 2022-12-18 NOTE — Progress Notes (Signed)
   Subjective:    Patient ID: Lee Barnes, male    DOB: 11-23-1950, 72 y.o.   MRN: [id number]  HPI He is here for another posthospitalization evaluation after another bout of aspiration pneumonia.  Usually the aspiration occurs when he coughs when he gets phlegm in the back of the throat which induces him to vomit and then he aspirates.  He has a very low threshold for vomiting and has been that way his whole entire life.  The admission H&P as well as discharge summary and lab data was reviewed.  As usual he has had no evidence of infection.   Review of Systems     Objective:   Physical Exam Alert and in no distress cardiac exam shows regular rhythm without murmurs gallops.  Lungs clear to auscultation.       Assessment & Plan:  Acute aspiration pneumonia ([place]) - Plan: Basic Metabolic Panel, CBC with Differential/Platelet  Chronic hyponatremia - Plan: Basic Metabolic Panel  History of oropharyngeal cancer After discussion with him and his wife to come up with the game plan of when he has another episode of vomiting she will help him make every effort to clear his throat.  They do have oxygen at home as well as a pulse ox.  I recommend that she try to keep his pulse ox at 90 or above and see if we can save a trip to the hospital.  It looks as if most times that he was at the hospital it was supportive care, evaluation for infection and he has not had 1 yet.  Hopefully we can eliminate the need for him coming to the emergency room.  They are both comfortable with that.

## 2022-12-18 NOTE — Patient Instructions (Signed)
If he vomits again, clean the mouth out as well as you can.  If it is pulse ox drops then start with 2 L and increase it to 4 to keep his pulse ox above 90 and if that does not work then were stuck

## 2022-12-19 LAB — BASIC METABOLIC PANEL
BUN/Creatinine Ratio: 33 — ABNORMAL HIGH (ref 10–24)
BUN: 33 mg/dL — ABNORMAL HIGH (ref 8–27)
CO2: 21 mmol/L (ref 20–29)
Calcium: 9.4 mg/dL (ref 8.6–10.2)
Chloride: 95 mmol/L — ABNORMAL LOW (ref 96–106)
Creatinine, Ser: 1.01 mg/dL (ref 0.76–1.27)
Glucose: 100 mg/dL — ABNORMAL HIGH (ref 70–99)
Potassium: 5.4 mmol/L — ABNORMAL HIGH (ref 3.5–5.2)
Sodium: 135 mmol/L (ref 134–144)
eGFR: 80 mL/min/{1.73_m2} (ref >59)

## 2022-12-19 LAB — CBC WITH DIFFERENTIAL/PLATELET
Basophils Absolute: 0 10*3/uL (ref 0.0–0.2)
Basos: 1 %
EOS (ABSOLUTE): 0.2 10*3/uL (ref 0.0–0.4)
Eos: 3 %
Hematocrit: 37.7 % (ref 37.5–51.0)
Hemoglobin: 12.2 g/dL — ABNORMAL LOW (ref 13.0–17.7)
Immature Grans (Abs): 0.1 10*3/uL (ref 0.0–0.1)
Immature Granulocytes: 1 %
Lymphocytes Absolute: 0.6 10*3/uL — ABNORMAL LOW (ref 0.7–3.1)
Lymphs: 12 %
MCH: 27.8 pg (ref 26.6–33.0)
MCHC: 32.4 g/dL (ref 31.5–35.7)
MCV: 86 fL (ref 79–97)
Monocytes Absolute: 0.6 10*3/uL (ref 0.1–0.9)
Monocytes: 11 %
Neutrophils Absolute: 3.8 10*3/uL (ref 1.4–7.0)
Neutrophils: 72 %
Platelets: 329 10*3/uL (ref 150–450)
RBC: 4.39 x10E6/uL (ref 4.14–5.80)
RDW: 14.3 % (ref 11.6–15.4)
WBC: 5.3 10*3/uL (ref 3.4–10.8)

## 2022-12-25 DIAGNOSIS — R0683 Snoring: Secondary | ICD-10-CM | POA: Diagnosis not present

## 2022-12-25 DIAGNOSIS — G4730 Sleep apnea, unspecified: Secondary | ICD-10-CM | POA: Diagnosis not present

## 2022-12-27 ENCOUNTER — Emergency Department (HOSPITAL_COMMUNITY): Payer: Medicare Other

## 2022-12-27 ENCOUNTER — Inpatient Hospital Stay (HOSPITAL_COMMUNITY)
Admission: EM | Admit: 2022-12-27 | Discharge: 2022-12-30 | DRG: 177 | Disposition: A | Discharge status: Home / Self Care | Payer: Medicare Other | Attending: Internal Medicine | Admitting: Internal Medicine

## 2022-12-27 ENCOUNTER — Observation Stay (HOSPITAL_COMMUNITY): Payer: Medicare Other

## 2022-12-27 ENCOUNTER — Encounter (HOSPITAL_COMMUNITY): Payer: Self-pay

## 2022-12-27 ENCOUNTER — Other Ambulatory Visit: Payer: Self-pay

## 2022-12-27 DIAGNOSIS — I959 Hypotension, unspecified: Secondary | ICD-10-CM | POA: Diagnosis present

## 2022-12-27 DIAGNOSIS — J690 Pneumonitis due to inhalation of food and vomit: Secondary | ICD-10-CM | POA: Diagnosis not present

## 2022-12-27 DIAGNOSIS — I10 Essential (primary) hypertension: Secondary | ICD-10-CM | POA: Diagnosis present

## 2022-12-27 DIAGNOSIS — K920 Hematemesis: Secondary | ICD-10-CM | POA: Diagnosis not present

## 2022-12-27 DIAGNOSIS — Z808 Family history of malignant neoplasm of other organs or systems: Secondary | ICD-10-CM

## 2022-12-27 DIAGNOSIS — Z85810 Personal history of malignant neoplasm of tongue: Secondary | ICD-10-CM | POA: Diagnosis not present

## 2022-12-27 DIAGNOSIS — Z8249 Family history of ischemic heart disease and other diseases of the circulatory system: Secondary | ICD-10-CM | POA: Diagnosis not present

## 2022-12-27 DIAGNOSIS — K529 Noninfective gastroenteritis and colitis, unspecified: Secondary | ICD-10-CM | POA: Diagnosis present

## 2022-12-27 DIAGNOSIS — E872 Acidosis, unspecified: Secondary | ICD-10-CM | POA: Diagnosis present

## 2022-12-27 DIAGNOSIS — F419 Anxiety disorder, unspecified: Secondary | ICD-10-CM | POA: Diagnosis present

## 2022-12-27 DIAGNOSIS — R1110 Vomiting, unspecified: Secondary | ICD-10-CM | POA: Diagnosis not present

## 2022-12-27 DIAGNOSIS — D509 Iron deficiency anemia, unspecified: Secondary | ICD-10-CM | POA: Diagnosis present

## 2022-12-27 DIAGNOSIS — Z85818 Personal history of malignant neoplasm of other sites of lip, oral cavity, and pharynx: Secondary | ICD-10-CM

## 2022-12-27 DIAGNOSIS — Z931 Gastrostomy status: Secondary | ICD-10-CM

## 2022-12-27 DIAGNOSIS — R1310 Dysphagia, unspecified: Secondary | ICD-10-CM | POA: Diagnosis present

## 2022-12-27 DIAGNOSIS — Z9221 Personal history of antineoplastic chemotherapy: Secondary | ICD-10-CM

## 2022-12-27 DIAGNOSIS — Z923 Personal history of irradiation: Secondary | ICD-10-CM | POA: Diagnosis not present

## 2022-12-27 DIAGNOSIS — E860 Dehydration: Secondary | ICD-10-CM | POA: Diagnosis present

## 2022-12-27 DIAGNOSIS — E871 Hypo-osmolality and hyponatremia: Secondary | ICD-10-CM | POA: Diagnosis present

## 2022-12-27 DIAGNOSIS — R112 Nausea with vomiting, unspecified: Secondary | ICD-10-CM | POA: Diagnosis not present

## 2022-12-27 DIAGNOSIS — T17908A Unspecified foreign body in respiratory tract, part unspecified causing other injury, initial encounter: Secondary | ICD-10-CM

## 2022-12-27 DIAGNOSIS — E43 Unspecified severe protein-calorie malnutrition: Secondary | ICD-10-CM

## 2022-12-27 DIAGNOSIS — B9620 Unspecified Escherichia coli [E. coli] as the cause of diseases classified elsewhere: Secondary | ICD-10-CM | POA: Diagnosis present

## 2022-12-27 DIAGNOSIS — K219 Gastro-esophageal reflux disease without esophagitis: Secondary | ICD-10-CM | POA: Diagnosis present

## 2022-12-27 DIAGNOSIS — Z743 Need for continuous supervision: Secondary | ICD-10-CM | POA: Diagnosis not present

## 2022-12-27 DIAGNOSIS — E861 Hypovolemia: Secondary | ICD-10-CM | POA: Diagnosis present

## 2022-12-27 DIAGNOSIS — C44319 Basal cell carcinoma of skin of other parts of face: Secondary | ICD-10-CM | POA: Diagnosis present

## 2022-12-27 DIAGNOSIS — E875 Hyperkalemia: Secondary | ICD-10-CM | POA: Diagnosis present

## 2022-12-27 DIAGNOSIS — R0902 Hypoxemia: Secondary | ICD-10-CM | POA: Diagnosis not present

## 2022-12-27 DIAGNOSIS — J9601 Acute respiratory failure with hypoxia: Secondary | ICD-10-CM | POA: Diagnosis present

## 2022-12-27 DIAGNOSIS — R000 Tachycardia, unspecified: Secondary | ICD-10-CM | POA: Diagnosis not present

## 2022-12-27 DIAGNOSIS — Z885 Allergy status to narcotic agent status: Secondary | ICD-10-CM | POA: Diagnosis not present

## 2022-12-27 DIAGNOSIS — Z79899 Other long term (current) drug therapy: Secondary | ICD-10-CM

## 2022-12-27 DIAGNOSIS — Z85828 Personal history of other malignant neoplasm of skin: Secondary | ICD-10-CM

## 2022-12-27 DIAGNOSIS — I9589 Other hypotension: Secondary | ICD-10-CM | POA: Diagnosis present

## 2022-12-27 LAB — CBC WITH DIFFERENTIAL/PLATELET
Abs Immature Granulocytes: 0.04 10*3/uL (ref 0.00–0.07)
Basophils Absolute: 0 10*3/uL (ref 0.0–0.1)
Basophils Relative: 0 %
Eosinophils Absolute: 0 10*3/uL (ref 0.0–0.5)
Eosinophils Relative: 0 %
HCT: 38.2 % — ABNORMAL LOW (ref 39.0–52.0)
Hemoglobin: 12 g/dL — ABNORMAL LOW (ref 13.0–17.0)
Immature Granulocytes: 0 %
Lymphocytes Relative: 2 %
Lymphs Abs: 0.2 10*3/uL — ABNORMAL LOW (ref 0.7–4.0)
MCH: 28 pg (ref 26.0–34.0)
MCHC: 31.4 g/dL (ref 30.0–36.0)
MCV: 89 fL (ref 80.0–100.0)
Monocytes Absolute: 1.1 10*3/uL — ABNORMAL HIGH (ref 0.1–1.0)
Monocytes Relative: 9 %
Neutro Abs: 11.1 10*3/uL — ABNORMAL HIGH (ref 1.7–7.7)
Neutrophils Relative %: 89 %
Platelets: 338 10*3/uL (ref 150–400)
RBC: 4.29 MIL/uL (ref 4.22–5.81)
RDW: 15.3 % (ref 11.5–15.5)
WBC: 12.5 10*3/uL — ABNORMAL HIGH (ref 4.0–10.5)
nRBC: 0 % (ref 0.0–0.2)

## 2022-12-27 LAB — COMPREHENSIVE METABOLIC PANEL
ALT: 19 U/L (ref 0–44)
AST: 30 U/L (ref 15–41)
Albumin: 3.6 g/dL (ref 3.5–5.0)
Alkaline Phosphatase: 80 U/L (ref 38–126)
Anion gap: 13 (ref 5–15)
BUN: 37 mg/dL — ABNORMAL HIGH (ref 8–23)
CO2: 25 mmol/L (ref 22–32)
Calcium: 9.2 mg/dL (ref 8.9–10.3)
Chloride: 95 mmol/L — ABNORMAL LOW (ref 98–111)
Creatinine, Ser: 1.13 mg/dL (ref 0.61–1.24)
GFR, Estimated: >60 mL/min (ref >60)
Glucose, Bld: 104 mg/dL — ABNORMAL HIGH (ref 70–99)
Potassium: 5.2 mmol/L — ABNORMAL HIGH (ref 3.5–5.1)
Sodium: 133 mmol/L — ABNORMAL LOW (ref 135–145)
Total Bilirubin: 0.6 mg/dL (ref 0.3–1.2)
Total Protein: 8.5 g/dL — ABNORMAL HIGH (ref 6.5–8.1)

## 2022-12-27 LAB — MRSA NEXT GEN BY PCR, NASAL: MRSA by PCR Next Gen: NOT DETECTED

## 2022-12-27 LAB — EXPECTORATED SPUTUM ASSESSMENT W GRAM STAIN, RFLX TO RESP C: Special Requests: NORMAL

## 2022-12-27 LAB — LACTIC ACID, PLASMA
Lactic Acid, Venous: 3 mmol/L (ref 0.5–1.9)
Lactic Acid, Venous: 2.5 mmol/L (ref 0.5–1.9)

## 2022-12-27 MED ORDER — MIDODRINE HCL 5 MG PO TABS
2.5000 mg | ORAL_TABLET | Freq: Two times a day (BID) | ORAL | Status: DC
  Filled 2022-12-27: qty 1

## 2022-12-27 MED ORDER — SODIUM CHLORIDE 0.9 % IV SOLN
3.0000 g | Freq: Once | INTRAVENOUS | Status: AC
  Administered 2022-12-27: 3 g via INTRAVENOUS
  Filled 2022-12-27: qty 8

## 2022-12-27 MED ORDER — ACETAMINOPHEN 325 MG PO TABS: 650.0000 mg | ORAL_TABLET | Freq: Four times a day (QID) | ORAL | Status: DC | PRN

## 2022-12-27 MED ORDER — CHLORHEXIDINE GLUCONATE CLOTH 2 % EX PADS
6.0000 | MEDICATED_PAD | Freq: Every day | CUTANEOUS | Status: DC
  Administered 2022-12-27 – 2022-12-30 (×3): 6 via TOPICAL

## 2022-12-27 MED ORDER — ACETAMINOPHEN 650 MG RE SUPP: 650.0000 mg | Freq: Four times a day (QID) | RECTAL | Status: DC | PRN

## 2022-12-27 MED ORDER — LACTATED RINGERS IV BOLUS
500.0000 mL | Freq: Once | INTRAVENOUS | Status: AC
  Administered 2022-12-27: 500 mL via INTRAVENOUS

## 2022-12-27 MED ORDER — ENOXAPARIN SODIUM 40 MG/0.4ML IJ SOSY
40.0000 mg | PREFILLED_SYRINGE | INTRAMUSCULAR | Status: DC
  Administered 2022-12-27 – 2022-12-29 (×3): 40 mg via SUBCUTANEOUS
  Filled 2022-12-27 (×3): qty 0.4

## 2022-12-27 MED ORDER — ONDANSETRON HCL 4 MG PO TABS: 4.0000 mg | ORAL_TABLET | Freq: Four times a day (QID) | ORAL | Status: DC | PRN

## 2022-12-27 MED ORDER — SALINE SPRAY 0.65 % NA SOLN
1.0000 | NASAL | Status: DC | PRN
  Filled 2022-12-27: qty 44

## 2022-12-27 MED ORDER — LOPERAMIDE HCL 2 MG PO CAPS: 2.0000 mg | ORAL_CAPSULE | ORAL | Status: DC | PRN

## 2022-12-27 MED ORDER — SODIUM CHLORIDE 0.9 % IV SOLN
500.0000 mL | Freq: Once | INTRAVENOUS | Status: AC
  Administered 2022-12-27: 500 mL via INTRAVENOUS

## 2022-12-27 MED ORDER — DEXTROSE IN LACTATED RINGERS 5 % IV SOLN: INTRAVENOUS | Status: DC

## 2022-12-27 MED ORDER — PANTOPRAZOLE SODIUM 40 MG PO TBEC: 40.0000 mg | DELAYED_RELEASE_TABLET | Freq: Every day | ORAL | Status: DC

## 2022-12-27 MED ORDER — SODIUM CHLORIDE 0.9 % IV BOLUS
500.0000 mL | Freq: Once | INTRAVENOUS | Status: AC
  Administered 2022-12-27: 500 mL via INTRAVENOUS

## 2022-12-27 MED ORDER — IPRATROPIUM-ALBUTEROL 0.5-2.5 (3) MG/3ML IN SOLN
3.0000 mL | Freq: Two times a day (BID) | RESPIRATORY_TRACT | Status: DC
  Administered 2022-12-27 – 2022-12-29 (×6): 3 mL via RESPIRATORY_TRACT
  Filled 2022-12-27 (×7): qty 3

## 2022-12-27 MED ORDER — GUAIFENESIN 100 MG/5ML PO LIQD
30.0000 mL | Freq: Two times a day (BID) | ORAL | Status: DC
  Administered 2022-12-27 – 2022-12-30 (×7): 30 mL
  Filled 2022-12-27 (×7): qty 30

## 2022-12-27 MED ORDER — SODIUM CHLORIDE 0.9 % IV SOLN: Freq: Once | INTRAVENOUS | Status: AC

## 2022-12-27 MED ORDER — PANTOPRAZOLE SODIUM 40 MG IV SOLR
40.0000 mg | INTRAVENOUS | Status: DC
  Administered 2022-12-27 – 2022-12-29 (×3): 40 mg via INTRAVENOUS
  Filled 2022-12-27 (×3): qty 10

## 2022-12-27 MED ORDER — FLUTICASONE PROPIONATE 50 MCG/ACT NA SUSP
2.0000 | Freq: Every day | NASAL | Status: DC
  Administered 2022-12-28 – 2022-12-30 (×3): 2 via NASAL
  Filled 2022-12-27: qty 16

## 2022-12-27 MED ORDER — ONDANSETRON HCL 4 MG/2ML IJ SOLN: 4.0000 mg | Freq: Four times a day (QID) | INTRAMUSCULAR | Status: DC | PRN

## 2022-12-27 MED ORDER — DIPHENHYDRAMINE HCL 25 MG PO TABS: 12.5000 mg | ORAL_TABLET | Freq: Every evening | ORAL | Status: DC | PRN

## 2022-12-27 MED ORDER — LORATADINE 10 MG PO TABS
10.0000 mg | ORAL_TABLET | Freq: Every day | ORAL | Status: DC
  Administered 2022-12-27 – 2022-12-30 (×4): 10 mg
  Filled 2022-12-27 (×4): qty 1

## 2022-12-27 MED ORDER — LACTATED RINGERS IV SOLN: INTRAVENOUS | Status: DC

## 2022-12-27 MED ORDER — SODIUM CHLORIDE 0.9 % IV SOLN
3.0000 g | Freq: Four times a day (QID) | INTRAVENOUS | Status: DC
  Administered 2022-12-27 – 2022-12-28 (×3): 3 g via INTRAVENOUS
  Filled 2022-12-27 (×3): qty 8

## 2022-12-27 MED ORDER — MIDODRINE HCL 5 MG PO TABS: 2.5000 mg | ORAL_TABLET | Freq: Two times a day (BID) | ORAL | Status: DC

## 2022-12-27 MED ORDER — DIPHENHYDRAMINE HCL 12.5 MG/5ML PO ELIX
12.5000 mg | ORAL_SOLUTION | Freq: Every evening | ORAL | Status: DC | PRN
  Administered 2022-12-27 – 2022-12-28 (×2): 12.5 mg via ORAL
  Filled 2022-12-27 (×2): qty 5

## 2022-12-27 MED ORDER — SODIUM CHLORIDE 3 % IN NEBU
4.0000 mL | INHALATION_SOLUTION | Freq: Two times a day (BID) | RESPIRATORY_TRACT | Status: DC
  Administered 2022-12-27 – 2022-12-28 (×2): 4 mL via RESPIRATORY_TRACT
  Filled 2022-12-27 (×2): qty 4

## 2022-12-27 MED ORDER — ALBUTEROL SULFATE (2.5 MG/3ML) 0.083% IN NEBU: 2.5000 mg | INHALATION_SOLUTION | RESPIRATORY_TRACT | Status: DC | PRN

## 2022-12-27 MED ORDER — MIDODRINE HCL 5 MG PO TABS
2.5000 mg | ORAL_TABLET | Freq: Two times a day (BID) | ORAL | Status: DC
  Administered 2022-12-27: 2.5 mg
  Filled 2022-12-27: qty 1

## 2022-12-27 NOTE — ED Triage Notes (Signed)
Pt coming from home via EMS with c/o vomiting since 0200 this morning. Pt denies any abdominal pain. Hx aspiration pneumonia. Patient has G-tube. Not on O2 at baseline. 97% on 2L O2 . 86% on Room air

## 2022-12-27 NOTE — ED Provider Notes (Signed)
[place] EMERGENCY DEPARTMENT AT [facility] Provider Note   CSN: [id number] Arrival date & time: 12/27/22  [id number]     History  Chief Complaint  Patient presents with   Emesis    Lee Barnes is a 72 y.o. male.  72 year old male with prior medical history as detailed below presents from home with EMS transport.  Patient with history of oropharyngeal cancer.  Patient with history of frequent aspiration events.  Patient with multiple episodes of vomiting this morning.  Patient is reporting likely aspiration event concurrent with emesis that occurred this a.m.  Patient reports desaturations down and to the mid 80s on room air.  With supplemental O2 via nasal cannula at 2 L his sats are in the mid 90s now.  Patient denies current nausea.  Patient reports feeling improved with supplemental O2.  Patient with frequent aspiration events with multiple recent hospitalizations for same.  The history is provided by the patient and medical records.       Home Medications Prior to Admission medications   Medication Sig Start Date End Date Taking? Authorizing Provider  albuterol (PROVENTIL) (2.5 MG/3ML) 0.083% nebulizer solution Take 3 mLs (2.5 mg total) by nebulization every 4 (four) hours as needed for wheezing. 10/15/22   Mariel Aloe, MD  cetirizine (ZYRTEC) 10 MG chewable tablet Place 10 mg into feeding tube daily as needed for allergies or rhinitis.    [provider]  fluticasone (FLONASE) 50 MCG/ACT nasal spray SPRAY TWO SPRAYS IN EACH NOSTRIL ONCE DAILY AS NEEDED Patient taking differently: Place 2 sprays into both nostrils at bedtime. 06/05/22   Denita Lung, MD  guaiFENesin (ROBITUSSIN) 100 MG/5ML liquid Place 15 mLs into feeding tube every 6 (six) hours as needed for cough or to loosen phlegm. 10/15/22   Mariel Aloe, MD  loperamide (IMODIUM) 2 MG capsule 2 mg See admin instructions. Place 2 mg into feeding tube daily as needed for diarrhea. Usually  given during a feeding. Patient not taking: Reported on 12/18/2022    [provider]  Nutritional Supplements (FEEDING SUPPLEMENT, OSMOLITE 1.5 CAL,) LIQD Place 1,425 mLs into feeding tube daily. 10/16/22   Mariel Aloe, MD  omeprazole (PRILOSEC) 40 MG capsule Removed the granules from the capsule and place him in the G-tube daily. Patient taking differently: 40 mg See admin instructions. Remove the granules from the capsule (40 mg) and place in the G-tube daily 06/28/22   Denita Lung, MD  ondansetron (ZOFRAN) 4 MG tablet Take 1 tablet (4 mg total) by mouth every 6 (six) hours as needed for nausea or vomiting. 12/13/22   Georgette Shell, MD  Pedialyte (PEDIALYTE) SOLN Place 355 mLs into feeding tube daily.    [provider]  Saccharomyces boulardii (FLORASTOR PO) Take 250 mg by mouth daily as needed (GI irritation).    [provider]  SYSTANE ULTRA PF 0.4-0.3 % SOLN Place 1 drop into both eyes daily as needed (for irritation). Patient not taking: Reported on 12/18/2022    [provider]      Allergies    Codeine    Review of Systems   Review of Systems  All other systems reviewed and are negative.   Physical Exam Updated Vital Signs BP (!) 147/95 (BP Location: Left Arm)   Pulse (!) 113   Temp 98.3 F (36.8 C) (Oral)   Resp (!) 23   Ht 6' (1.829 m)   Wt 73 kg   SpO2 96%  BMI 21.84 kg/m  Physical Exam Vitals and nursing note reviewed.  Constitutional:      General: He is not in acute distress.    Appearance: Normal appearance. He is well-developed.  HENT:     Head: Normocephalic and atraumatic.  Eyes:     Conjunctiva/sclera: Conjunctivae normal.     Pupils: Pupils are equal, round, and reactive to light.  Cardiovascular:     Rate and Rhythm: Regular rhythm. Tachycardia present.     Heart sounds: Normal heart sounds.  Pulmonary:     Effort: Pulmonary effort is normal. No respiratory distress.     Comments: Decreased breath  sounds at right base Abdominal:     General: There is no distension.     Palpations: Abdomen is soft.     Tenderness: There is no abdominal tenderness.  Musculoskeletal:        General: No deformity. Normal range of motion.     Cervical back: Normal range of motion and neck supple.  Skin:    General: Skin is warm and dry.  Neurological:     General: No focal deficit present.     Mental Status: He is alert and oriented to person, place, and time.     ED Results / Procedures / Treatments   Labs (all labs ordered are listed, but only abnormal results are displayed) Labs Reviewed  CBC WITH DIFFERENTIAL/PLATELET - Abnormal; Notable for the following components:      Result Value   WBC 12.5 (*)    Hemoglobin 12.0 (*)    HCT 38.2 (*)    Neutro Abs 11.1 (*)    Lymphs Abs 0.2 (*)    Monocytes Absolute 1.1 (*)    All other components within normal limits  COMPREHENSIVE METABOLIC PANEL - Abnormal; Notable for the following components:   Sodium 133 (*)    Potassium 5.2 (*)    Chloride 95 (*)    Glucose, Bld 104 (*)    BUN 37 (*)    Total Protein 8.5 (*)    All other components within normal limits  LACTIC ACID, PLASMA - Abnormal; Notable for the following components:   Lactic Acid, Venous 3.0 (*)    All other components within normal limits  CULTURE, BLOOD (ROUTINE X 2)  CULTURE, BLOOD (ROUTINE X 2)  LACTIC ACID, PLASMA    EKG EKG Interpretation  Date/Time:  Friday December 27 2022 09:32:33 EST Ventricular Rate:  115 PR Interval:  160 QRS Duration: 78 QT Interval:  318 QTC Calculation: 440 R Axis:   268 Text Interpretation: Sinus tachycardia Left anterior fascicular block Consider right ventricular hypertrophy Consider anterior infarct Confirmed by Dene Gentry [phone]) on 12/27/2022 9:42:19 AM  Radiology DG Chest Port 1 View  Result Date: 12/27/2022 CLINICAL DATA:  Vomiting EXAM: PORTABLE CHEST 1 VIEW COMPARISON:  Chest radiograph dated 12/11/2022 FINDINGS: Similar  asymmetric elevation of the right hemidiaphragm. Interval decreased conspicuity of left lung opacities with persistent right lower lung patchy and linear opacities. No pleural effusion or pneumothorax. Similar cardiomediastinal silhouette. The visualized skeletal structures are unremarkable. IMPRESSION: Interval decreased conspicuity of left lung opacities with persistent right lower lung patchy and linear opacities, which may reflect a combination of atelectasis, aspiration, and/or pneumonia. Electronically Signed   By: Darrin Nipper M.D.   On: 12/27/2022 09:55    Procedures Procedures    Medications Ordered in ED Medications  sodium chloride 0.9 % bolus 500 mL (has no administration in time range)  Ampicillin-Sulbactam (UNASYN) 3 g  in sodium chloride 0.9 % 100 mL IVPB (has no administration in time range)    ED Course/ Medical Decision Making/ A&P                             Medical Decision Making Amount and/or Complexity of Data Reviewed Labs: ordered. Radiology: ordered.  Risk Prescription drug management. Decision regarding hospitalization.    Medical Screen Complete  This patient presented to the ED with complaint of aspiration event.  This complaint involves an extensive number of treatment options. The initial differential diagnosis includes, but is not limited to, aspiration event  This presentation is: Acute, Chronic, Self-Limited, Previously Undiagnosed, Uncertain Prognosis, Complicated, Systemic Symptoms, and Threat to Life/Bodily Function  Patient is presenting after aspiration event.  Patient is requiring supplemental O2 at 2 L to maintenance in the mid 90s.  He is comfortable and same.  Patient with mildly elevated lactic acid, hyponatremia, mild leukocytosis.  Patient given Unasyn to cover possible aspiration event bed.  Patient would benefit from admission.  Hospitalist service made aware of case and will evaluate for same.  Additional history  obtained:  Additional history obtained from  External records from outside sources obtained and reviewed including prior ED visits and prior Inpatient records.    Lab Tests:  I ordered and personally interpreted labs.  The pertinent results include: CBC, CMP, lactic acid   Imaging Studies ordered:  I ordered imaging studies including chest x-ray I independently visualized and interpreted obtained imaging which showed possible aspiration and right lower lobe I agree with the radiologist interpretation.   Cardiac Monitoring:  The patient was maintained on a cardiac monitor.  I personally viewed and interpreted the cardiac monitor which showed an underlying rhythm of: Sinus tach   Medicines ordered:  I ordered medication including IV fluids, Unasyn for suspected  aspiration event Reevaluation of the patient after these medicines showed that the patient: improved    Problem List / ED Course:  Aspiration   Reevaluation:  After the interventions noted above, I reevaluated the patient and found that they have: improved  Disposition:  After consideration of the diagnostic results and the patients response to treatment, I feel that the patent would benefit from admission.          Final Clinical Impression(s) / ED Diagnoses Final diagnoses:  Aspiration into airway, initial encounter    Rx / DC Orders ED Discharge Orders     None         Valarie Merino, MD 12/27/22 1134

## 2022-12-27 NOTE — Progress Notes (Signed)
PHARMACY NOTE -  Unasyn  Pharmacy has been assisting with dosing of Unasyn for aspiration pneumonia. Dosage remains stable at 3 g IV q6 hr and further renal adjustments per institutional Pharmacy antibiotic protocol  Pharmacy will sign off, following peripherally for culture results, dose adjustments, and length of therapy. Please reconsult if a change in clinical status warrants re-evaluation of dosage.  Reuel Boom, PharmD, BCPS [phone] 12/27/2022, 1:27 PM

## 2022-12-27 NOTE — H&P (Signed)
History and Physical    Patient: [name] [id number] DOB: 06-28-1951 DOA: 12/27/2022 DOS: the patient was seen and examined on 12/27/2022 PCP: Denita Lung, MD  Patient coming from: Home  Chief Complaint:  Chief Complaint  Patient presents with   Emesis   HPI: Lee Barnes is a 72 y.o. male with medical history significant of squamous cell carcinoma oropharyngeal  S/P Peg tube placement, recently discharge 1/26 treated for aspiration PNA, HTN, GERD. Presents after multiples episode of vomiting (3 episodes at home, One with EMS), likely aspiration event and hypoxia. Oxygen drop to 80 on Room air.  He relates that he started having a lot of phlegm in his upper airway and  then he gets nauseous and have a gag reflex and to start vomiting.  He vomited 3 times today fluid content and phlegm.  At times he can vomit tube feedings.  He had some nausea.  He denies abdominal pain. Wife notice small amount of blood in the secretions.   He has chronic diarrhea on and off from tube feedings, he take Imodium as needed.  He had to take Imodium last night for 2 episode of a small sized watery diarrhea.  He has chronic low blood pressure, they were told because of neck radiation. He was on midodrine, but has not required any dose since December.   Evaluation in the ED: sodium 132, k 5.2, WBC 12.5, Hb 12, lactic acid at 3.0. Hypotension. Chest x ray: Interval decreased conspicuity of left lung opacities with persistent right lower lung patchy and linear opacities, which may reflect a combination of atelectasis, aspiration, and/or pneumonia.      Review of Systems: As mentioned in the history of present illness. All other systems reviewed and are negative. Past Medical History:  Diagnosis Date   Allergy to environmental factors    Basal cell carcinoma of left cheek 0000000   Complication of anesthesia    "vageled" after surgery -overnite stay   Diverticulosis 2008   Heart murmur     hx of in childhood    History of chemotherapy    History of radiation therapy 11/05/10-12/26/10   r base tongue, 7000 cGy 35 sessions   Hypertension    Medication started in May 2016.   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   Tinnitus    Past Surgical History:  Procedure Laterality Date   APPENDECTOMY     COLONOSCOPY     INGUINAL HERNIA REPAIR Bilateral 03/30/2015   Procedure: LAPAROSCOPIC BILATERAL INGUINAL HERNIA REPAIR WITH MESH;  Surgeon: Coralie Keens, MD;  Location: [facility];  Service: General;  Laterality: Bilateral;   INGUINAL HERNIA REPAIR Right 11/18/1958   INGUINAL HERNIA REPAIR Bilateral 03/30/2015   INGUINAL HERNIA REPAIR Left 07/11/2015   INGUINAL HERNIA REPAIR N/A 07/11/2015   Procedure: REPAIR OF LEFT INGUINAL HERNIA WITH MESH;  Surgeon: Coralie Keens, MD;  Location: [place];  Service: General;  Laterality: N/A;   INSERTION OF MESH N/A 07/11/2015   Procedure: INSERTION OF MESH;  Surgeon: Coralie Keens, MD;  Location: [place];  Service: General;  Laterality: N/A;   IR [place] W/FLUORO  12/03/2021   LUMBAR [place] SURGERY     PEG PLACEMENT N/A 03/22/2022   Procedure: PERCUTANEOUS ENDOSCOPIC GASTROSTOMY (PEG) REPLACEMENT;  Surgeon: Carol Ada, MD;  Location: [facility];  Service: Gastroenterology;  Laterality: N/A;  SAVORY DILATION N/A 03/22/2022   Procedure: SAVORY DILATION;  Surgeon: Carol Ada, MD;  Location: [facility];  Service: Gastroenterology;  Laterality: N/A;   SKIN BIOPSY Left 06/24/2022   basal cell carcinoma face cheek   TONSILLECTOMY     Social History:  reports that he has never smoked. He has never used smokeless tobacco. He reports that he does not currently use alcohol. He reports that he does not use drugs.  Allergies  Allergen Reactions   Codeine Nausea And Vomiting    Family History  Problem Relation Age of Onset   Heart disease Father     Cancer Father        throat ca   Heart disease Mother    Colon cancer Neg Hx     Prior to Admission medications   Medication Sig Start Date End Date Taking? Authorizing Provider  albuterol (PROVENTIL) (2.5 MG/3ML) 0.083% nebulizer solution Take 3 mLs (2.5 mg total) by nebulization every 4 (four) hours as needed for wheezing. 10/15/22   Mariel Aloe, MD  cetirizine (ZYRTEC) 10 MG chewable tablet Place 10 mg into feeding tube daily as needed for allergies or rhinitis.    [provider]  fluticasone (FLONASE) 50 MCG/ACT nasal spray SPRAY TWO SPRAYS IN EACH NOSTRIL ONCE DAILY AS NEEDED Patient taking differently: Place 2 sprays into both nostrils at bedtime. 06/05/22   Denita Lung, MD  guaiFENesin (ROBITUSSIN) 100 MG/5ML liquid Place 15 mLs into feeding tube every 6 (six) hours as needed for cough or to loosen phlegm. 10/15/22   Mariel Aloe, MD  loperamide (IMODIUM) 2 MG capsule 2 mg See admin instructions. Place 2 mg into feeding tube daily as needed for diarrhea. Usually given during a feeding. Patient not taking: Reported on 12/18/2022    [provider]  Nutritional Supplements (FEEDING SUPPLEMENT, OSMOLITE 1.5 CAL,) LIQD Place 1,425 mLs into feeding tube daily. 10/16/22   Mariel Aloe, MD  omeprazole (PRILOSEC) 40 MG capsule Removed the granules from the capsule and place him in the G-tube daily. Patient taking differently: 40 mg See admin instructions. Remove the granules from the capsule (40 mg) and place in the G-tube daily 06/28/22   Denita Lung, MD  ondansetron (ZOFRAN) 4 MG tablet Take 1 tablet (4 mg total) by mouth every 6 (six) hours as needed for nausea or vomiting. 12/13/22   Georgette Shell, MD  Pedialyte (PEDIALYTE) SOLN Place 355 mLs into feeding tube daily.    [provider]  Saccharomyces boulardii (FLORASTOR PO) Take 250 mg by mouth daily as needed (GI irritation).    [provider]  SYSTANE ULTRA PF 0.4-0.3 % SOLN  Place 1 drop into both eyes daily as needed (for irritation). Patient not taking: Reported on 12/18/2022    [provider]    Physical Exam: Vitals:   12/27/22 0945 12/27/22 0948 12/27/22 1052 12/27/22 1115  BP:   100/62 112/73  Pulse: (!) 113  (!) 104 (!) 102  Resp: (!) 23  (!) 22 17  Temp:  98.3 F (36.8 C)    TempSrc:  Oral    SpO2: 96%  94% 95%  Weight:      Height:       General; Alert, thin appearing, in no distress.  CVS; S 1, S 2 RRR Lungs; BL ronchus. Normal respiratory effort.  Abdomen; soft, NT, BS presents, peg tube in place.  Extremities; no edema Neuro; alert, conversant, follows command.   Data Reviewed:  Labs reviewed.   Assessment and Plan: No notes have been filed under this hospital service. Service: Hospitalist  1-Acute Hypoxic respiratory failure, in setting of Aspiration Pneumonia;  Presented with hypoxia, oxygen drop to 80 after vomiting episodes. Leukocytosis., chest x ray persistent right lower lobe opacities.  Currently requiring  3 L oxygen.  -Continue with IV Unasyn.  -Check sputum culture.  -For upper respiratory secretion, increase Guaifenesin, hypertonic saline nebulizer, PPI, Flonase to treat sinus congestion. Flutter valve, incentive spirometry.  -Duo-neb BID.  -Claritin.   2-Hypotension, lactic acidosis.  In setting of hypovolemia from vomiting. Concern for infection as well.  IV bolus, and  IV fluids.  Resume midodrine low dose.   Mild Hyperkalemia.  Monitor.  Repeat labs am.   Dysphagia, Nutrition;  Continue to hold Tube feeding today, due to nausea, vomiting.  Plan to resume tube feeding tomorrow.   Nausea, vomiting;  Suspect related to cough, increase resp secretions .  Check KUB.  PRN Zofran.   GERD; PPI.      Advance Care Planning:   Code Status: Prior full code  Consults: None  Family Communication: Care discussed with Wife who was at bedside.   Severity of Illness: The appropriate patient status  for this patient is OBSERVATION. Observation status is judged to be reasonable and necessary in order to provide the required intensity of service to ensure the patient's safety. The patient's presenting symptoms, physical exam findings, and initial radiographic and laboratory data in the context of their medical condition is felt to place them at decreased risk for further clinical deterioration. Furthermore, it is anticipated that the patient will be medically stable for discharge from the hospital within 2 midnights of admission.   Author: Elmarie Shiley, MD 12/27/2022 12:01 PM  For on call review [email or web address].

## 2022-12-28 DIAGNOSIS — B9620 Unspecified Escherichia coli [E. coli] as the cause of diseases classified elsewhere: Secondary | ICD-10-CM | POA: Diagnosis present

## 2022-12-28 DIAGNOSIS — I10 Essential (primary) hypertension: Secondary | ICD-10-CM | POA: Diagnosis present

## 2022-12-28 DIAGNOSIS — E875 Hyperkalemia: Secondary | ICD-10-CM | POA: Diagnosis present

## 2022-12-28 DIAGNOSIS — E872 Acidosis, unspecified: Secondary | ICD-10-CM | POA: Diagnosis present

## 2022-12-28 DIAGNOSIS — R1310 Dysphagia, unspecified: Secondary | ICD-10-CM | POA: Diagnosis present

## 2022-12-28 DIAGNOSIS — E860 Dehydration: Secondary | ICD-10-CM | POA: Diagnosis present

## 2022-12-28 DIAGNOSIS — Z9221 Personal history of antineoplastic chemotherapy: Secondary | ICD-10-CM | POA: Diagnosis not present

## 2022-12-28 DIAGNOSIS — Z8249 Family history of ischemic heart disease and other diseases of the circulatory system: Secondary | ICD-10-CM | POA: Diagnosis not present

## 2022-12-28 DIAGNOSIS — I959 Hypotension, unspecified: Secondary | ICD-10-CM | POA: Diagnosis present

## 2022-12-28 DIAGNOSIS — Z885 Allergy status to narcotic agent status: Secondary | ICD-10-CM | POA: Diagnosis not present

## 2022-12-28 DIAGNOSIS — K529 Noninfective gastroenteritis and colitis, unspecified: Secondary | ICD-10-CM | POA: Diagnosis present

## 2022-12-28 DIAGNOSIS — I9589 Other hypotension: Secondary | ICD-10-CM | POA: Diagnosis present

## 2022-12-28 DIAGNOSIS — J9601 Acute respiratory failure with hypoxia: Secondary | ICD-10-CM | POA: Diagnosis present

## 2022-12-28 DIAGNOSIS — F419 Anxiety disorder, unspecified: Secondary | ICD-10-CM | POA: Diagnosis present

## 2022-12-28 DIAGNOSIS — Z808 Family history of malignant neoplasm of other organs or systems: Secondary | ICD-10-CM | POA: Diagnosis not present

## 2022-12-28 DIAGNOSIS — J690 Pneumonitis due to inhalation of food and vomit: Secondary | ICD-10-CM | POA: Diagnosis present

## 2022-12-28 DIAGNOSIS — Z931 Gastrostomy status: Secondary | ICD-10-CM | POA: Diagnosis not present

## 2022-12-28 DIAGNOSIS — E861 Hypovolemia: Secondary | ICD-10-CM | POA: Diagnosis present

## 2022-12-28 DIAGNOSIS — E871 Hypo-osmolality and hyponatremia: Secondary | ICD-10-CM | POA: Diagnosis present

## 2022-12-28 DIAGNOSIS — Z85810 Personal history of malignant neoplasm of tongue: Secondary | ICD-10-CM | POA: Diagnosis not present

## 2022-12-28 DIAGNOSIS — K219 Gastro-esophageal reflux disease without esophagitis: Secondary | ICD-10-CM | POA: Diagnosis present

## 2022-12-28 DIAGNOSIS — Z85828 Personal history of other malignant neoplasm of skin: Secondary | ICD-10-CM | POA: Diagnosis not present

## 2022-12-28 DIAGNOSIS — D509 Iron deficiency anemia, unspecified: Secondary | ICD-10-CM | POA: Diagnosis present

## 2022-12-28 DIAGNOSIS — Z923 Personal history of irradiation: Secondary | ICD-10-CM | POA: Diagnosis not present

## 2022-12-28 DIAGNOSIS — T17908A Unspecified foreign body in respiratory tract, part unspecified causing other injury, initial encounter: Secondary | ICD-10-CM | POA: Diagnosis not present

## 2022-12-28 LAB — GLUCOSE, CAPILLARY
Glucose-Capillary: 108 mg/dL — ABNORMAL HIGH (ref 70–99)
Glucose-Capillary: 117 mg/dL — ABNORMAL HIGH (ref 70–99)
Glucose-Capillary: 128 mg/dL — ABNORMAL HIGH (ref 70–99)

## 2022-12-28 LAB — CBC
HCT: 30.5 % — ABNORMAL LOW (ref 39.0–52.0)
Hemoglobin: 9.7 g/dL — ABNORMAL LOW (ref 13.0–17.0)
MCH: 28.4 pg (ref 26.0–34.0)
MCHC: 31.8 g/dL (ref 30.0–36.0)
MCV: 89.2 fL (ref 80.0–100.0)
Platelets: 235 10*3/uL (ref 150–400)
RBC: 3.42 MIL/uL — ABNORMAL LOW (ref 4.22–5.81)
RDW: 15.1 % (ref 11.5–15.5)
WBC: 8.1 10*3/uL (ref 4.0–10.5)
nRBC: 0 % (ref 0.0–0.2)

## 2022-12-28 LAB — BASIC METABOLIC PANEL
Anion gap: 8 (ref 5–15)
BUN: 20 mg/dL (ref 8–23)
CO2: 26 mmol/L (ref 22–32)
Calcium: 8.7 mg/dL — ABNORMAL LOW (ref 8.9–10.3)
Chloride: 99 mmol/L (ref 98–111)
Creatinine, Ser: 0.73 mg/dL (ref 0.61–1.24)
GFR, Estimated: >60 mL/min (ref >60)
Glucose, Bld: 101 mg/dL — ABNORMAL HIGH (ref 70–99)
Potassium: 4.2 mmol/L (ref 3.5–5.1)
Sodium: 133 mmol/L — ABNORMAL LOW (ref 135–145)

## 2022-12-28 LAB — MAGNESIUM: Magnesium: 1.7 mg/dL (ref 1.7–2.4)

## 2022-12-28 LAB — PHOSPHORUS: Phosphorus: 2.8 mg/dL (ref 2.5–4.6)

## 2022-12-28 MED ORDER — OSMOLITE 1.5 CAL PO LIQD
1000.0000 mL | ORAL | Status: DC
  Administered 2022-12-28 – 2022-12-29 (×2): 1000 mL
  Filled 2022-12-28 (×5): qty 1000

## 2022-12-28 MED ORDER — SODIUM CHLORIDE 3 % IN NEBU
4.0000 mL | INHALATION_SOLUTION | Freq: Two times a day (BID) | RESPIRATORY_TRACT | Status: AC
  Administered 2022-12-28 – 2022-12-30 (×4): 4 mL via RESPIRATORY_TRACT
  Filled 2022-12-28 (×4): qty 4

## 2022-12-28 MED ORDER — SODIUM CHLORIDE 3 % IN NEBU: 4.0000 mL | INHALATION_SOLUTION | Freq: Every day | RESPIRATORY_TRACT | Status: DC

## 2022-12-28 MED ORDER — HYDRALAZINE HCL 20 MG/ML IJ SOLN: 5.0000 mg | Freq: Four times a day (QID) | INTRAMUSCULAR | Status: DC | PRN

## 2022-12-28 MED ORDER — HYDRALAZINE HCL 20 MG/ML IJ SOLN
2.0000 mg | Freq: Four times a day (QID) | INTRAMUSCULAR | Status: DC | PRN
  Administered 2022-12-28: 2 mg via INTRAVENOUS

## 2022-12-28 MED ORDER — FREE WATER
150.0000 mL | Status: DC
  Administered 2022-12-28 – 2022-12-30 (×10): 150 mL

## 2022-12-28 MED ORDER — PIPERACILLIN-TAZOBACTAM 3.375 G IVPB
3.3750 g | Freq: Three times a day (TID) | INTRAVENOUS | Status: DC
  Administered 2022-12-28 – 2022-12-30 (×7): 3.375 g via INTRAVENOUS
  Filled 2022-12-28 (×7): qty 50

## 2022-12-28 MED ORDER — HYDRALAZINE HCL 20 MG/ML IJ SOLN
INTRAMUSCULAR | Status: AC
  Filled 2022-12-28: qty 1

## 2022-12-28 MED ORDER — PROSOURCE TF20 ENFIT COMPATIBL EN LIQD
60.0000 mL | Freq: Every day | ENTERAL | Status: DC
  Administered 2022-12-28 – 2022-12-30 (×3): 60 mL
  Filled 2022-12-28 (×3): qty 60

## 2022-12-28 MED ORDER — ALPRAZOLAM 0.25 MG PO TABS: 0.2500 mg | ORAL_TABLET | Freq: Two times a day (BID) | ORAL | Status: DC | PRN

## 2022-12-28 NOTE — Progress Notes (Signed)
Initial Nutrition Assessment  DOCUMENTATION CODES:   Not applicable  INTERVENTION:  Due to recent nausea and emesis, recommend resuming continuous tube feeds via G-tube: -Initiate Osmolite 1.5 at 20 mL/hour and advance by 10 mL/hour every 8 hours to goal rate of 60 mL/hour (1440 mL goal daily volume) -Provide PROSource TF20 60 mL daily -Provides: 2240 kcal, 110 grams protein, 1094 mL H2O daily  Provide free water flush of 150 mL 5 times daily. Provides 1844 mL H2O daily including water in tube feeds.  Once tolerating goal rate of 24 hour continuous feeds, transition as tolerated to providing tube feeds over 15 hours via G-tube: -Step 1: Provide Osmolite 1.5 at 72 mL/hour x 20 hours -Step 2: Provide Osmolite 1.5 at 85 mL/hour x 17 hours -Step 3: Provide Osmolite 1.5 at 95 mL/hour x 15 hours from 0600-2100 -PROSource TF20 60 mL daily Provides: 2218 kcal, 109 grams of protein, 1086 mL H2O daily With free water flush of 150 mL 5 times daily, will provide a total of 1836 mL H2O daily.  NUTRITION DIAGNOSIS:   Inadequate oral intake related to inability to eat as evidenced by NPO status (reliance on tube feeds and water flushes via G-tube to meet nutrition and hydration needs.).  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  Unable to reach patient over the phone after multiple attempts. Able to speak with patient's wife Peyten Arnette over the phone at [phone]. She reports pt is NPO and does not eat or drink anything by mouth. Relies solely on enteral nutrition and water flushes via G-tube to meet needs. She reports pt has diarrhea with tube feeds no matter the formula and they have tried multiple different formulas in the past. She provides Imodium occasionally for diarrhea (but doesn't require daily). She reports they remain on continuous tube feeds since transitioning in November 2023. Patient gags on thick secretions and then has emesis and has had several episodes of aspiration related to this. She reports they are working on thinning secretions to help with this, but it is difficult as pt cannot eat or drink. Discharged in November on Osmolite 1.5 at 95 mL/hour x 15 hours daily (0600-2100). Wife reports they have slowly increased rate as tolerated at home up to 110 mL/hour x 13 hours, but ensures pt receives 6 cartons daily. Pt receives 30 mL of a PROSource supplement daily (wife unsure of exactly which one, possibly PROSource Plus as she reports it comes in a large container). She has been giving 150 mL free water flush 4 times daily (given 4 hours apart) and she feels he may not be getting enough water. They are trying to avoid a late night water flush. She had stopped giving Pedialyte as pt was having some hyperkalemia. Noted 125 mL 6 times daily was recommended in November 2023. Discussed that to  get the same volume could consider 150 mL water flush 5 times daily and make flushes every 3 hours so they are a little closer together. Patient last received tube feeds on 2/8. In setting of emesis and aspiration, plan is to first advance to continuous regimen over 24 hours. Can then transition to 95 mL/hour x 15 hours once pt tolerating goal continuous rate. 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  Patient's wife reports that patient has been weight-stable. She reports UBW is 164 lbs (74.5 kg) but that sometimes weight fluctuates when dehydrated. Weight on admission was 73 kg (161 lbs), but wife reports he was dehydrated. Will continue to monitor trends.  Enteral Access: G-tube present on admission  Medications reviewed and include: pantoprazole, Zosyn  Labs reviewed: Sodium 133  UOP: 1300 mL  I/O: -1180 mL since admission  Noted previously met criteria for malnutrition. Unable to complete NFPE to assess for malnutrition as RD is working remotely. Recommend assessing NFPE at follow-up as able to assess nutritional status.  Discussed with RN via secure chat.  NUTRITION - FOCUSED PHYSICAL EXAM:  Unable to complete as RD is working remotely.  Diet Order:   Diet Order             Diet NPO time specified  Diet effective now                  EDUCATION NEEDS:   No education needs have been identified at this time  Skin:  Skin Assessment: Reviewed RN Assessment  Last BM:  12/27/22 per chart  Height:   Ht Readings from Last 1 Encounters:  12/27/22 6' (1.829 m)   Weight:   Wt Readings from Last 1 Encounters:  12/27/22 73 kg   Ideal Body Weight:  80.9 kg  BMI:  Body mass index is 21.84 kg/m.  Estimated Nutritional Needs:   Kcal:  2200-2400  Protein:  110-135 grams  Fluid:  >/= 1.8 L/day  Loanne Drilling, MS, RD, LDN, CNSC Pager number available on Amion

## 2022-12-28 NOTE — Progress Notes (Signed)
PROGRESS NOTE    Lee Barnes  [id number] DOB: [birth year] DOA: 12/27/2022 PCP: Denita Lung, MD   Brief Narrative: 72 year old with past medical history significant for squamous cell carcinoma oropharynx noted status post PEG tube placement, recently discharged from the hospital 1/26 treated for aspiration pneumonia, hypertension, GERD presents with vomiting, episode of aspiration pneumonia and hypotension.     Assessment & Plan:   Principal Problem:   Hypotension Active Problems:   Acute aspiration pneumonia (HCC)   Chronic hyponatremia   Acute respiratory failure with hypoxia (HCC)   GERD (gastroesophageal reflux disease)   Basal cell carcinoma of left cheek   Chronic diarrhea   Hyperkalemia   G tube feedings (HCC)  1-Acute Hypoxic respiratory failure, in setting of Aspiration Pneumonia;  Presented with hypoxia, oxygen drop to 80 after vomiting episodes. Leukocytosis., chest x ray persistent right lower lobe opacities. Required  3 L oxygen on admission. Down to 2L -Change IV Unasyn to Zosyn, sputum culture growing few gram negative rods.  -For upper respiratory secretion, Continue with Guaifenesin, hypertonic saline nebulizer, PPI, Flonase to treat sinus congestion. Flutter valve, incentive spirometry.  -Duo-neb BID.  -Claritin.  Plan to continue with antibiotics.  WBC normalized   2-Hypotension, lactic acidosis.  Sepsis ruled out. Hypotension from hypovolemia, dehydration.  BP improved with IV fluids.  He only required one dose of midodrine yesterday. Hypotension resolved.    Hypertension;  His SBP increase today up to 180. BP was check after they draw blood. repeated BP decreased to 145. No significant difference in BP reading between arm.  No chest pain.  He has had fluctuation of BP for years per patient and wife.  Recommend BP diary-- follow up with PCP. If BP persistently elevated he might required As needed BP medications. Could use Norvasc, that wont  affect electrolytes. Would defer further evaluation of labile  BP to PCP.     Mild Hyperkalemia.  Resolved.    Dysphagia, Nutrition;  He denies Nausea, resume tube feeding today.    Nausea, vomiting;  Suspect related to cough, increase resp secretions .  KUB. negative PRN Zofran.    GERD; PPI.   Anemia;  Hb on admission was 12. Down to 9.7.  Baseline 10 Suspect hb elevated on admission secondary to hemoconcentration from dehydration.  Will check anemia panel in am.   EKG; no significant ST elevation. Similar to prior EKG Denies chest pain.   Estimated body mass index is 21.84 kg/m as calculated from the following:   Height as of this encounter: 6' (1.829 m).   Weight as of this encounter: 73 kg.   DVT prophylaxis: Lovenox Code Status: Full code Family Communication: Care discussed with patient.  Disposition Plan:  Status is: Observation The patient will require care spanning > 2 midnights and should be moved to inpatient because: management of hypoxic resp failure    Consultants:  None  Procedures:  None  Antimicrobials:    Subjective: He is still having a lot phlegm, thick secretions.  He denies chest pain.  His BP was elevated this am, he was not having any symptoms. BP subsequently decreased to 140/  Denies worsening dyspnea.   Objective: Vitals:   12/27/22 1830 12/27/22 1943 12/27/22 2126 12/28/22 0420  BP: 127/74     Pulse: 85     Resp: 19     Temp:  98 F (36.7 C)  (!) 97.3 F (36.3 C)  TempSrc:  Oral  Oral  SpO2: 100%  100%  Weight:      Height:        Intake/Output Summary (Last 24 hours) at 12/28/2022 0727 Last data filed at 12/28/2022 [id number] Gross per 24 hour  Intake 120 ml  Output 1300 ml  Net -1180 ml   Filed Weights   12/27/22 0933  Weight: 73 kg    Examination:  General exam: Appears calm and comfortable  Respiratory system: Respiratory effort normal. BL air movement.  Cardiovascular system: S1 & S2 heard, RRR.   Gastrointestinal system: Abdomen is nondistended, soft and nontender. Peg tube in place Central nervous system: Alert and oriented.  Extremities: Symmetric 5 x 5 power.  Data Reviewed: I have personally reviewed following labs and imaging studies  CBC: Recent Labs  Lab 12/27/22 1010  WBC 12.5*  NEUTROABS 11.1*  HGB 12.0*  HCT 38.2*  MCV 89.0  PLT [id number]   Basic Metabolic Panel: Recent Labs  Lab 12/27/22 1010  NA 133*  K 5.2*  CL 95*  CO2 25  GLUCOSE 104*  BUN 37*  CREATININE 1.13  CALCIUM 9.2   GFR: Estimated Creatinine Clearance: 61.9 mL/min (by C-G formula based on SCr of 1.13 mg/dL). Liver Function Tests: Recent Labs  Lab 12/27/22 1010  AST 30  ALT 19  ALKPHOS 80  BILITOT 0.6  PROT 8.5*  ALBUMIN 3.6   No results for input(s): "LIPASE", "AMYLASE" in the last 168 hours. No results for input(s): "AMMONIA" in the last 168 hours. Coagulation Profile: No results for input(s): "INR", "PROTIME" in the last 168 hours. Cardiac Enzymes: No results for input(s): "CKTOTAL", "CKMB", "CKMBINDEX", "TROPONINI" in the last 168 hours. BNP (last 3 results) No results for input(s): "PROBNP" in the last 8760 hours. HbA1C: No results for input(s): "HGBA1C" in the last 72 hours. CBG: No results for input(s): "GLUCAP" in the last 168 hours. Lipid Profile: No results for input(s): "CHOL", "HDL", "LDLCALC", "TRIG", "CHOLHDL", "LDLDIRECT" in the last 72 hours. Thyroid Function Tests: No results for input(s): "TSH", "T4TOTAL", "FREET4", "T3FREE", "THYROIDAB" in the last 72 hours. Anemia Panel: No results for input(s): "VITAMINB12", "FOLATE", "FERRITIN", "TIBC", "IRON", "RETICCTPCT" in the last 72 hours. Sepsis Labs: Recent Labs  Lab 12/27/22 1010 12/27/22 1259  LATICACIDVEN 3.0* 2.5*    Recent Results (from the past 240 hour(s))  Expectorated Sputum Assessment w Gram Stain, Rflx to Resp Cult     Status: None   Collection Time: 12/27/22  2:23 PM   Specimen: Expectorated  Sputum  Result Value Ref Range Status   Specimen Description EXPECTORATED SPUTUM  Final   Special Requests Normal  Final   Sputum evaluation   Final    THIS SPECIMEN IS ACCEPTABLE FOR SPUTUM CULTURE Performed at [facility], [place] [address] [place], [place] [address]    Report Status 12/27/2022 FINAL  Final  Culture, Respiratory w Gram Stain     Status: None (Preliminary result)   Collection Time: 12/27/22  2:23 PM  Result Value Ref Range Status   Specimen Description   Final    EXPECTORATED SPUTUM Performed at [place] [address] [place], [place] [address]    Special Requests   Final    Normal Reflexed from [phone] Performed at [facility], [place] [address] [place], [place] [address]    Gram Stain   Final    FEW WBC PRESENT, PREDOMINANTLY PMN FEW GRAM NEGATIVE RODS Performed at [facility], [place] [address] [place],  [address]    Culture PENDING  Incomplete   Report Status PENDING  Incomplete  MRSA Next Gen by PCR, Nasal     Status: None   Collection Time: 12/27/22  2:30 PM   Specimen: Nasal Mucosa; Nasal Swab  Result Value Ref Range Status   MRSA by PCR Next Gen NOT DETECTED NOT DETECTED Final    Comment: (NOTE) The GeneXpert MRSA Assay (FDA approved for NASAL specimens only), is one component of a comprehensive MRSA colonization surveillance program. It is not intended to diagnose MRSA infection nor to guide or monitor treatment for MRSA infections. Test performance is not FDA approved in patients less than 25 years old. Performed at [facility], [place] [address], [place], [place] [address]          Radiology Studies: DG Abd 1 View  Result Date: 12/27/2022 CLINICAL DATA:  Nausea and vomiting, G-tube EXAM: ABDOMEN - 1 VIEW COMPARISON:  10/07/2022 abdominal radiograph. FINDINGS: Percutaneous gastrostomy tube terminates over the body of the stomach. No dilated small bowel  loops. Mild colonic gas in stool. No evidence of pneumatosis or pneumoperitoneum. Chronic prominent elevation of the right hemidiaphragm. No radiopaque nephrolithiasis. IMPRESSION: Nonobstructive bowel gas pattern. Electronically Signed   By: Ilona Sorrel M.D.   On: 12/27/2022 14:22   DG Chest Port 1 View  Result Date: 12/27/2022 CLINICAL DATA:  Vomiting EXAM: PORTABLE CHEST 1 VIEW COMPARISON:  Chest radiograph dated 12/11/2022 FINDINGS: Similar asymmetric elevation of the right hemidiaphragm. Interval decreased conspicuity of left lung opacities with persistent right lower lung patchy and linear opacities. No pleural effusion or pneumothorax. Similar cardiomediastinal silhouette. The visualized skeletal structures are unremarkable. IMPRESSION: Interval decreased conspicuity of left lung opacities with persistent right lower lung patchy and linear opacities, which may reflect a combination of atelectasis, aspiration, and/or pneumonia. Electronically Signed   By: Darrin Nipper M.D.   On: 12/27/2022 09:55        Scheduled Meds:  Chlorhexidine Gluconate Cloth  6 each Topical Daily   enoxaparin (LOVENOX) injection  40 mg Subcutaneous Q24H   fluticasone  2 spray Each Nare Daily   guaiFENesin  30 mL Per Tube BID   ipratropium-albuterol  3 mL Nebulization BID   loratadine  10 mg Per Tube Daily   midodrine  2.5 mg Per Tube BID WC   pantoprazole (PROTONIX) IV  40 mg Intravenous Q24H   sodium chloride HYPERTONIC  4 mL Nebulization BID   Continuous Infusions:  dextrose 5% lactated ringers 100 mL/hr at 12/27/22 1651     LOS: 0 days    Time spent: 35 minutes    Avyan Livesay A Shellsea Borunda, MD [facility]   If 7PM-7AM, please contact night-coverage [email or web address]  12/28/2022, 7:27 AM

## 2022-12-28 NOTE — Progress Notes (Signed)
Pharmacy Antibiotic Note  Lee Barnes is a 72 y.o. male admitted on 12/27/2022 with aspiration pneumonia.  Pharmacy has been consulted for Zosyn dosing.  Plan: Zosyn 3.375g IV q8h (4 hour infusion). F/u sputum culture  Height: 6' (182.9 cm) Weight: 73 kg (161 lb) IBW/kg (Calculated) : 77.6  Temp (24hrs), Avg:98.4 F (36.9 C), Min:97.3 F (36.3 C), Max:100 F (37.8 C)  Recent Labs  Lab 12/27/22 1010 12/27/22 1259  WBC 12.5*  --   CREATININE 1.13  --   LATICACIDVEN 3.0* 2.5*    Estimated Creatinine Clearance: 61.9 mL/min (by C-G formula based on SCr of 1.13 mg/dL).    Allergies  Allergen Reactions   Codeine Nausea And Vomiting   2/9 Unasyn>>2/10 2/10 ZEI>>   2/9  MRSA - 2/9 sputum: few GNRs 2/9 BCx2: sent   Thank you for allowing pharmacy to be a part of this patient's care.  Eudelia Bunch, Pharm.D Use secure chat for questions 12/28/2022 7:49 AM

## 2022-12-29 DIAGNOSIS — T17908A Unspecified foreign body in respiratory tract, part unspecified causing other injury, initial encounter: Secondary | ICD-10-CM | POA: Diagnosis not present

## 2022-12-29 LAB — GLUCOSE, CAPILLARY
Glucose-Capillary: 142 mg/dL — ABNORMAL HIGH (ref 70–99)
Glucose-Capillary: 122 mg/dL — ABNORMAL HIGH (ref 70–99)
Glucose-Capillary: 106 mg/dL — ABNORMAL HIGH (ref 70–99)
Glucose-Capillary: 99 mg/dL (ref 70–99)
Glucose-Capillary: 116 mg/dL — ABNORMAL HIGH (ref 70–99)

## 2022-12-29 LAB — PHOSPHORUS
Phosphorus: 3.4 mg/dL (ref 2.5–4.6)
Phosphorus: 3.8 mg/dL (ref 2.5–4.6)

## 2022-12-29 LAB — BASIC METABOLIC PANEL
Anion gap: 10 (ref 5–15)
BUN: 18 mg/dL (ref 8–23)
CO2: 26 mmol/L (ref 22–32)
Calcium: 8.6 mg/dL — ABNORMAL LOW (ref 8.9–10.3)
Chloride: 93 mmol/L — ABNORMAL LOW (ref 98–111)
Creatinine, Ser: 0.89 mg/dL (ref 0.61–1.24)
GFR, Estimated: >60 mL/min (ref >60)
Glucose, Bld: 105 mg/dL — ABNORMAL HIGH (ref 70–99)
Potassium: 4 mmol/L (ref 3.5–5.1)
Sodium: 129 mmol/L — ABNORMAL LOW (ref 135–145)

## 2022-12-29 LAB — MAGNESIUM
Magnesium: 1.8 mg/dL (ref 1.7–2.4)
Magnesium: 2.5 mg/dL — ABNORMAL HIGH (ref 1.7–2.4)

## 2022-12-29 MED ORDER — MAGNESIUM SULFATE 2 GM/50ML IV SOLN
2.0000 g | Freq: Once | INTRAVENOUS | Status: AC
  Administered 2022-12-29: 2 g via INTRAVENOUS
  Filled 2022-12-29: qty 50

## 2022-12-29 MED ORDER — SODIUM CHLORIDE 0.9 % IV SOLN: INTRAVENOUS | Status: AC

## 2022-12-29 MED ORDER — HYDRALAZINE HCL 20 MG/ML IJ SOLN: 2.0000 mg | Freq: Four times a day (QID) | INTRAMUSCULAR | Status: DC | PRN

## 2022-12-29 NOTE — TOC Initial Note (Signed)
Transition of Care [facility]) - Initial/Assessment Note    Patient Details  Name: Lee Barnes MRN: [id number] Date of Birth: April 26, 1951  Transition of [facility]) CM/SW Contact:    Henrietta Dine, RN Phone Number: 12/29/2022, 12:57 PM  Clinical Narrative:                 [facility] consult for d/c planning; spoke w/ pt in room; he says he is from home and he plans to return at d/c; pt identified POC Kaje Faye (spouse) [phone]; he says he has transportation; pt denies IPV, food insecurity, or problems paying utilities; pt says he has reading glasses, left HA, and he is deaf in right ear; he says he does not have dentures; pt says he does not have cane, crutches, walker, wheel chair or bars in shower; he says there is a built in shower seat; pt says he has home oxygen and TF but he is not sure of company; che says his travel tank is full; contacted pt's wife and she says pt has oxygen and TF w/ [employer]; Mrs Dimarzio says pt gets Osmolyte 1.5 110 ml/hr via PEG from [facility]; LVM for Erasmo Downer at [employer] to notify her ot pt location; TOC will follow.  Expected Discharge Plan: Home/Self Care Barriers to Discharge: Continued Medical Work up   Patient Goals and CMS Choice Patient states their goals for this hospitalization and ongoing recovery are:: home CMS Medicare.gov Compare Post Acute Care list provided to:: Patient Choice offered to / list presented to : Patient      Expected Discharge Plan and Services   Discharge Planning Services: CM Consult Post Acute Care Choice: Resumption of Svcs/PTA Provider (home oxygen, home tube feedings) Living arrangements for the past 2 months: [place]                                      Prior Living Arrangements/Services Living arrangements for the past 2 months: Single Family Home Lives with:: Spouse Patient language and need for interpreter reviewed:: Yes Do you feel safe going back to the place where you live?: Yes       Need for Family Participation in Patient Care: Yes (Comment) Care giver support system in place?: Yes (comment) Current home services: DME (home oxygen, home tube feedings) Criminal Activity/Legal Involvement Pertinent to Current Situation/Hospitalization: No - Comment as needed  Activities of Daily Living      Permission Sought/Granted Permission sought to share information with : Case Manager Permission granted to share information with : Yes, Verbal Permission Granted  Share Information with NAME: Lenor Coffin, RN, CM     Permission granted to share info w Relationship: Kelani Pritchett (spouse) [phone]     Emotional Assessment Appearance:: Appears stated age Attitude/Demeanor/Rapport: Gracious Affect (typically observed): Accepting Orientation: : Oriented to Self, Oriented to Place, Oriented to  Time, Oriented to Situation Alcohol / Substance Use: Not Applicable Psych Involvement: No (comment)  Admission diagnosis:  Hypotension [I95.9] Aspiration into airway, initial encounter [T17.908A] Patient Active Problem List   Diagnosis Date Noted   Hypotension 12/27/2022   SOB (shortness of breath) 12/11/2022   G tube feedings ([place]) 11/27/2022   Acute aspiration pneumonia ([place]) 11/13/2022   Chronic diarrhea 11/13/2022   Hyperkalemia 11/13/2022   Hypertensive urgency 10/10/2022   Normocytic anemia 10/10/2022   Protein-calorie malnutrition, severe 10/09/2022   Basal cell carcinoma of left cheek  07/17/2022   GERD (gastroesophageal reflux disease) 06/27/2022   On tube feeding diet 06/27/2022   Acute respiratory failure with hypoxia (HCC) 06/26/2022   Chronic hyponatremia 02/20/2021   Dysphagia 12/12/2020   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   ACE-inhibitor cough 05/25/2015   Essential hypertension 03/21/2015   Allergic rhinitis 12/14/2013   ED (erectile dysfunction) 12/14/2013   Diverticulosis    History of  oropharyngeal cancer 09/18/2010   PCP:  Denita Lung, MD Pharmacy:   [facility] [id number] - [place], [place] [address] Phone: [phone] Fax: [place] [address] Phone: [phone] Fax: [phone]     Social Determinants of Health (SDOH) Social History: SDOH Screenings   Food Insecurity: No Food Insecurity (12/29/2022)  Housing: Low Risk  (12/11/2022)  Transportation Needs: No Transportation Needs (12/29/2022)  Utilities: Not At Risk (12/29/2022)  Depression (PHQ2-9): Low Risk  (11/01/2022)  Financial Resource Strain: Low Risk  (11/01/2022)  Physical Activity: Inactive (11/01/2022)  Stress: No Stress Concern Present (11/01/2022)  Tobacco Use: Low Risk  (12/27/2022)   SDOH Interventions: Food Insecurity Interventions: Inpatient TOC Housing Interventions: Inpatient TOC Transportation Interventions: Inpatient TOC Utilities Interventions: Inpatient TOC   Readmission Risk Interventions    12/29/2022   12:54 PM 12/12/2022    2:04 PM  Readmission Risk Prevention Plan  Transportation Screening Complete Complete  PCP or Specialist Appt within 3-5 Days Complete Complete  HRI or [place] Complete Complete  Social Work Consult for [place] Planning/Counseling Complete Complete  Palliative Care Screening Not Applicable Complete  Medication Review Press photographer) Complete

## 2022-12-29 NOTE — Progress Notes (Signed)
Pt transferred to [facility]. Report given. Pt stable at time of transfer.

## 2022-12-29 NOTE — Progress Notes (Signed)
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     Assessment & Plan:   Principal Problem:   Hypotension Active Problems:   Acute aspiration pneumonia (HCC)   Chronic hyponatremia   Acute respiratory failure with hypoxia (HCC)   GERD (gastroesophageal reflux disease)   Basal cell carcinoma of left cheek   Chronic diarrhea   Hyperkalemia   G tube feedings (HCC)  1-Acute Hypoxic respiratory failure, in setting of Aspiration Pneumonia;  Presented with hypoxia, oxygen drop to 80 after vomiting episodes. Leukocytosis., chest x ray persistent right lower lobe opacities. Required  3 L oxygen on admission. Down to RA>  -On IV Zosyn, sputum culture growing Gram negative Rods.  -For upper respiratory secretion, Continue with Guaifenesin, hypertonic saline nebulizer, PPI, Flonase to treat sinus congestion. Flutter valve, incentive spirometry.  -Duo-neb BID.  -Claritin.  Plan to continue with antibiotics.  WBC normalized  Culture growing E coli, And enterobacter Cloacae.   2-Hypotension, lactic acidosis.  Sepsis ruled out. Hypotension from hypovolemia, dehydration.  BP improved with IV fluids.  He only required one dose of midodrine this admission.  Hypotension resolved.    Hypertension; Labile BP.  His BP can drop more than 30 points suddenly per wife. We have to be careful with meds.  Suspect some component of anxiety.   His SBP increased  up to 180. BP was check after they draw blood. repeated BP decreased to 145.  No chest pain.  He has had fluctuation of BP for years per patient and wife.  Recommend BP  diary-- follow up with PCP. If BP persistently elevated he might required As needed BP medications. Could use Norvasc, that wont affect electrolytes. Would defer further evaluation of labile  BP to PCP.   BP 140   Hyponatremia; Resume IV fluids for 12 hours.   Mild Hyperkalemia.  Resolved.   Hypomagnesemia; replete IV Dysphagia, Nutrition;  Back on tube feeding.  Send message to nutritionist to see if we can resume home tube feeding regimen.    Nausea, vomiting;  Suspect related to cough, increase resp secretions .  KUB. negative PRN Zofran.    GERD; PPI.   Anemia;  Hb on admission was 12. Down to 9.7.  Baseline 10 Suspect hb elevated on admission secondary to hemoconcentration from dehydration.  Will check anemia panel in am.   EKG; no significant ST elevation. Similar to prior EKG Denies chest pain.   Estimated body mass index is 21.84 kg/m as calculated from the following:   Height as of this encounter: 6' (1.829 m).   Weight as of this encounter: 73 kg.   DVT prophylaxis: Lovenox Code Status: Full code Family Communication: Care discussed with patient. And wife over phone.  Disposition Plan:  Status is: Observation The patient will require care spanning > 2 midnights and should be moved to inpatient because: management of hypoxic resp failure    Consultants:  None  Procedures:  None  Antimicrobials:    Subjective: He had an episode of reflux , once since tube feeding resume. I advised he needs sitting up at least 45--90 degrees.  He is still coughing Phlegm, less thick, had few chunk yellowish phlegm.  No worsening dyspnea.   Objective: Vitals:   12/29/22 0900 12/29/22 0905 12/29/22 0907 12/29/22 1239  BP:    138/79  Pulse: 89 98 90 83  Resp: (!) 22 19 (!) 23 14  Temp:    98 F (36.7 C)  TempSrc:    Oral  SpO2: 92% 90% 94% 97%  Weight:      Height:        Intake/Output Summary (Last 24 hours) at 12/29/2022 1518 Last data filed at 12/29/2022  0901 Gross per 24 hour  Intake 521.61 ml  Output 450 ml  Net 71.61 ml    Filed Weights   12/27/22 0933  Weight: 73 kg    Examination:  General exam: NAD Respiratory system: Normal respiratory effort. BL air movement.   Cardiovascular system: S 1, S 2 RRR Gastrointestinal system: BS present, soft nt  Peg tube in place Central nervous system: Alert, follows command Extremities: Symmetric power.   Data Reviewed: I have personally reviewed following labs and imaging studies  CBC: Recent Labs  Lab 12/27/22 1010 12/28/22 0755  WBC 12.5* 8.1  NEUTROABS 11.1*  --   HGB 12.0* 9.7*  HCT 38.2* 30.5*  MCV 89.0 89.2  PLT 338 [id number]    Basic Metabolic Panel: Recent Labs  Lab 12/27/22 1010 12/28/22 0755 12/28/22 1634 12/29/22 0530  NA 133* 133*  --  129*  K 5.2* 4.2  --  4.0  CL 95* 99  --  93*  CO2 25 26  --  26  GLUCOSE 104* 101*  --  105*  BUN 37* 20  --  18  CREATININE 1.13 0.73  --  0.89  CALCIUM 9.2 8.7*  --  8.6*  MG  --   --  1.7 1.8  PHOS  --   --  2.8 3.4    GFR: Estimated Creatinine Clearance: 78.6 mL/min (by C-G formula based on SCr of 0.89 mg/dL). Liver Function Tests: Recent Labs  Lab 12/27/22 1010  AST 30  ALT 19  ALKPHOS 80  BILITOT 0.6  PROT 8.5*  ALBUMIN 3.6    No results for input(s): "LIPASE", "AMYLASE" in the last 168 hours. No results for input(s): "AMMONIA" in the last 168 hours. Coagulation Profile: No results for input(s): "INR", "PROTIME" in the last 168 hours. Cardiac Enzymes: No results for input(s): "CKTOTAL", "CKMB", "CKMBINDEX", "TROPONINI" in the last 168 hours. BNP (last 3 results) No results for input(s): "PROBNP" in the last 8760 hours. HbA1C: No results for input(s): "HGBA1C" in the last 72 hours. CBG: Recent Labs  Lab 12/28/22 1959 12/28/22 2350 12/29/22 0444 12/29/22 0842 12/29/22 1247  GLUCAP 128* 117* 142* 122* 106*   Lipid Profile: No results for input(s): "CHOL", "HDL", "LDLCALC", "TRIG", "CHOLHDL",  "LDLDIRECT" in the last 72 hours. Thyroid Function Tests: No results for input(s): "TSH", "T4TOTAL", "FREET4", "T3FREE", "THYROIDAB" in the last 72 hours. Anemia Panel: No results for input(s): "VITAMINB12", "FOLATE", "FERRITIN", "TIBC", "IRON", "RETICCTPCT" in the last 72 hours. Sepsis Labs: Recent Labs  Lab 12/27/22 1010 12/27/22 1259  LATICACIDVEN 3.0* 2.5*     Recent Results (from the past 240 hour(s))  Culture, blood (routine x 2)     Status: None (Preliminary result)   Collection Time: 12/27/22  9:45 AM   Specimen: BLOOD  Result Value Ref Range Status   Specimen Description   Final    BLOOD BLOOD LEFT FOREARM Performed at [facility] [address] [place], [place] [address]    Special Requests  Final    BOTTLES DRAWN AEROBIC AND ANAEROBIC Blood Culture adequate volume Performed at [facility] [address], [place], [place] [address]    Culture   Final    NO GROWTH 2 DAYS Performed at [facility] [address] [place], [place] [address]    Report Status PENDING  Incomplete  Culture, blood (routine x 2)     Status: None (Preliminary result)   Collection Time: 12/27/22 10:00 AM   Specimen: BLOOD  Result Value Ref Range Status   Specimen Description   Final    BLOOD BLOOD RIGHT FOREARM Performed at [facility] [address] [place], [place] [address]    Special Requests   Final    BOTTLES DRAWN AEROBIC AND ANAEROBIC Blood Culture results may not be optimal due to an excessive volume of blood received in culture bottles Performed at [facility] [address], [place], [place] [address]    Culture   Final    NO GROWTH 2 DAYS Performed at [facility] [address] [place], [place] [address]    Report Status PENDING  Incomplete  Expectorated Sputum Assessment w Gram Stain, Rflx to Resp Cult     Status: None   Collection Time: 12/27/22  2:23 PM   Specimen:  Expectorated Sputum  Result Value Ref Range Status   Specimen Description EXPECTORATED SPUTUM  Final   Special Requests Normal  Final   Sputum evaluation   Final    THIS SPECIMEN IS ACCEPTABLE FOR SPUTUM CULTURE Performed at [facility], [place] [address], [place], [place] [address]    Report Status 12/27/2022 FINAL  Final  Culture, Respiratory w Gram Stain     Status: None (Preliminary result)   Collection Time: 12/27/22  2:23 PM  Result Value Ref Range Status   Specimen Description   Final    EXPECTORATED SPUTUM Performed at [facility] [address], [place], [place] [address]    Special Requests   Final    Normal Reflexed from [phone] Performed at [facility], [place] [address] [place], [place] [address]    Gram Stain   Final    FEW WBC PRESENT, PREDOMINANTLY PMN FEW GRAM NEGATIVE RODS    Culture   Final    MODERATE ENTEROBACTER CLOACAE FEW ESCHERICHIA COLI SUSCEPTIBILITIES TO FOLLOW Performed at [facility], [place] [address] [place], [place] [address]    Report Status PENDING  Incomplete   Organism ID, Bacteria ENTEROBACTER CLOACAE  Final      Susceptibility   Enterobacter cloacae - MIC*    CEFAZOLIN >=64 RESISTANT Resistant     CEFEPIME <=0.12 SENSITIVE Sensitive     CEFTAZIDIME <=1 SENSITIVE Sensitive     CIPROFLOXACIN <=0.25 SENSITIVE Sensitive     GENTAMICIN <=1 SENSITIVE Sensitive     IMIPENEM 0.5 SENSITIVE Sensitive     TRIMETH/SULFA <=20 SENSITIVE Sensitive     PIP/TAZO <=4 SENSITIVE Sensitive     * MODERATE ENTEROBACTER CLOACAE  MRSA Next Gen by PCR, Nasal     Status: None   Collection Time: 12/27/22  2:30 PM   Specimen: Nasal Mucosa; Nasal Swab  Result Value Ref Range Status   MRSA by PCR Next Gen NOT DETECTED NOT DETECTED Final    Comment: (NOTE) The GeneXpert MRSA Assay (FDA approved for NASAL specimens only), is one component of a comprehensive MRSA colonization surveillance program. It  is not  intended to diagnose MRSA infection nor to guide or monitor treatment for MRSA infections. Test performance is not FDA approved in patients less than 55 years old. Performed at [facility], [place] [address] [place], [place] [address]          Radiology Studies: No results found.      Scheduled Meds:  Chlorhexidine Gluconate Cloth  6 each Topical Daily   enoxaparin (LOVENOX) injection  40 mg Subcutaneous Q24H   feeding supplement (PROSource TF20)  60 mL Per Tube Daily   fluticasone  2 spray Each Nare Daily   free water  150 mL Per Tube 5 times per day   guaiFENesin  30 mL Per Tube BID   ipratropium-albuterol  3 mL Nebulization BID   loratadine  10 mg Per Tube Daily   pantoprazole (PROTONIX) IV  40 mg Intravenous Q24H   sodium chloride HYPERTONIC  4 mL Nebulization BID   Continuous Infusions:  sodium chloride     feeding supplement (OSMOLITE 1.5 CAL) 40 mL/hr at 12/29/22 0850   piperacillin-tazobactam (ZOSYN)  IV 3.375 g (12/29/22 1023)     LOS: 1 day    Time spent: 35 minutes    Kathyleen Radice A Laylah Riga, MD [facility]   If 7PM-7AM, please contact night-coverage [email or web address]  12/29/2022, 3:18 PM

## 2022-12-30 ENCOUNTER — Other Ambulatory Visit (HOSPITAL_COMMUNITY): Payer: Self-pay

## 2022-12-30 DIAGNOSIS — T17908A Unspecified foreign body in respiratory tract, part unspecified causing other injury, initial encounter: Secondary | ICD-10-CM | POA: Diagnosis not present

## 2022-12-30 LAB — BASIC METABOLIC PANEL
Anion gap: 9 (ref 5–15)
BUN: 20 mg/dL (ref 8–23)
CO2: 25 mmol/L (ref 22–32)
Calcium: 8.5 mg/dL — ABNORMAL LOW (ref 8.9–10.3)
Chloride: 97 mmol/L — ABNORMAL LOW (ref 98–111)
Creatinine, Ser: 0.89 mg/dL (ref 0.61–1.24)
GFR, Estimated: >60 mL/min (ref >60)
Glucose, Bld: 111 mg/dL — ABNORMAL HIGH (ref 70–99)
Potassium: 4.2 mmol/L (ref 3.5–5.1)
Sodium: 131 mmol/L — ABNORMAL LOW (ref 135–145)

## 2022-12-30 LAB — IRON AND TIBC
Iron: 36 ug/dL — ABNORMAL LOW (ref 45–182)
Saturation Ratios: 13 % — ABNORMAL LOW (ref 17.9–39.5)
TIBC: 272 ug/dL (ref 250–450)
UIBC: 236 ug/dL

## 2022-12-30 LAB — GLUCOSE, CAPILLARY
Glucose-Capillary: 129 mg/dL — ABNORMAL HIGH (ref 70–99)
Glucose-Capillary: 137 mg/dL — ABNORMAL HIGH (ref 70–99)
Glucose-Capillary: 88 mg/dL (ref 70–99)
Glucose-Capillary: 98 mg/dL (ref 70–99)

## 2022-12-30 LAB — RETICULOCYTES
Immature Retic Fract: 24.7 % — ABNORMAL HIGH (ref 2.3–15.9)
RBC.: 3.22 MIL/uL — ABNORMAL LOW (ref 4.22–5.81)
Retic Count, Absolute: 65.4 10*3/uL (ref 19.0–186.0)
Retic Ct Pct: 2 % (ref 0.4–3.1)

## 2022-12-30 LAB — PHOSPHORUS: Phosphorus: 3.8 mg/dL (ref 2.5–4.6)

## 2022-12-30 LAB — MAGNESIUM: Magnesium: 2.3 mg/dL (ref 1.7–2.4)

## 2022-12-30 LAB — FERRITIN: Ferritin: 211 ng/mL (ref 24–336)

## 2022-12-30 LAB — FOLATE: Folate: 19.9 ng/mL (ref >5.9)

## 2022-12-30 LAB — VITAMIN B12: Vitamin B-12: 466 pg/mL (ref 180–914)

## 2022-12-30 MED ORDER — SULFAMETHOXAZOLE-TRIMETHOPRIM 200-40 MG/5ML PO SUSP
20.0000 mL | Freq: Two times a day (BID) | ORAL | 0 refills | Status: DC
  Filled 2022-12-30: qty 100, 3d supply, fill #0

## 2022-12-30 MED ORDER — GUAIFENESIN 100 MG/5ML PO LIQD
30.0000 mL | Freq: Two times a day (BID) | ORAL | 0 refills | Status: DC
  Filled 2022-12-30: qty 120, 2d supply, fill #0

## 2022-12-30 MED ORDER — IPRATROPIUM-ALBUTEROL 0.5-2.5 (3) MG/3ML IN SOLN
3.0000 mL | Freq: Two times a day (BID) | RESPIRATORY_TRACT | Status: DC
  Administered 2022-12-30: 3 mL via RESPIRATORY_TRACT
  Filled 2022-12-30: qty 3

## 2022-12-30 MED ORDER — IPRATROPIUM-ALBUTEROL 0.5-2.5 (3) MG/3ML IN SOLN: 3.0000 mL | Freq: Two times a day (BID) | RESPIRATORY_TRACT | Status: DC

## 2022-12-30 MED ORDER — FERROUS SULFATE 300 (60 FE) MG/5ML PO SOLN
300.0000 mg | Freq: Every day | ORAL | 0 refills | Status: DC
  Filled 2022-12-30 – 2023-01-01 (×2): qty 150, 30d supply, fill #0

## 2022-12-30 MED ORDER — ALBUTEROL SULFATE (2.5 MG/3ML) 0.083% IN NEBU
2.5000 mg | INHALATION_SOLUTION | RESPIRATORY_TRACT | 2 refills | Status: DC | PRN
  Filled 2022-12-30: qty 90, 5d supply, fill #0
  Filled 2023-01-13: qty 90, 5d supply, fill #1

## 2022-12-30 MED ORDER — SODIUM CHLORIDE 3 % IN NEBU
4.0000 mL | INHALATION_SOLUTION | Freq: Every day | RESPIRATORY_TRACT | 12 refills | Status: DC
  Filled 2022-12-30: qty 240, 60d supply, fill #0

## 2022-12-30 MED ORDER — SULFAMETHOXAZOLE-TRIMETHOPRIM 200-40 MG/5ML PO SUSP
20.0000 mL | Freq: Two times a day (BID) | ORAL | 0 refills | Status: AC
  Filled 2022-12-30: qty 280, 7d supply, fill #0

## 2022-12-30 MED ORDER — SALINE SPRAY 0.65 % NA SOLN
1.0000 | NASAL | 0 refills | Status: DC | PRN
  Filled 2022-12-30: qty 88, 30d supply, fill #0

## 2022-12-30 MED ORDER — SULFAMETHOXAZOLE-TRIMETHOPRIM 200-40 MG/5ML PO SUSP
20.0000 mL | Freq: Two times a day (BID) | ORAL | Status: DC
  Filled 2022-12-30: qty 20

## 2022-12-30 MED ORDER — SODIUM CHLORIDE 3 % IN NEBU: 4.0000 mL | INHALATION_SOLUTION | Freq: Every day | RESPIRATORY_TRACT | Status: DC

## 2022-12-30 NOTE — Discharge Instructions (Addendum)
If respiratory secretion get thicker ok to take guaifenesin 82m Three times a day.  Use Hypertonic saline daily or BID if respiratory secretions get thicker.   Use Flonase daily for sinus congestion.   Do not lying down for at least two hour after you complete tube feeding.  You have a new antibiotics to cover both bacteria.

## 2022-12-30 NOTE — Discharge Summary (Signed)
Physician Discharge Summary   Patient: Lee Barnes MRN: [id number] DOB: [birth year]  Admit date:     12/27/2022  Discharge date: 12/30/22  Discharge Physician: Elmarie Shiley   PCP: Denita Lung, MD   Recommendations at discharge:   Follow up with ENT for further evaluation sinus drainage.  Continue with respiratory toilet, flutter valve, Guaifenesin. Nebulizer.  Consider palliative care consult for psychological and diseases burden support.  Further evaluation of iron deficiency anemia by PCP/ gastroenterologist   Discharge Diagnoses: Principal Problem:   Hypotension Active Problems:   Acute aspiration pneumonia (HCC)   Chronic hyponatremia   Acute respiratory failure with hypoxia (HCC)   GERD (gastroesophageal reflux disease)   Basal cell carcinoma of left cheek   Chronic diarrhea   Hyperkalemia   G tube feedings (HCC)  Resolved Problems:   * No resolved hospital problems. 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    Assessment and Plan: 1-Acute Hypoxic respiratory failure, in setting of Aspiration Pneumonia;  Presented with hypoxia, oxygen drop to 80 after vomiting episodes. Leukocytosis., chest x ray persistent right lower lobe opacities. Required  3 L oxygen on admission. Down to RA>  -On IV Zosyn, sputum culture growing Gram negative Rods.  -For upper respiratory secretion, Continue with Guaifenesin, hypertonic saline nebulizer, PPI, Flonase to treat sinus congestion. Flutter valve, incentive spirometry.  -Duo-neb BID.  -Claritin.  Plan to continue with antibiotics.  WBC normalized  Culture growing E coli, And enterobacter Cloacae. sensitive to Bactrim. Discharge on 7 days of antibiotics  to complete 10 days course.    2-Hypotension, lactic acidosis.  Sepsis ruled out. Hypotension from hypovolemia, dehydration.  BP improved with IV fluids.  He only required one dose of midodrine this admission.  Hypotension resolved.    Hypertension; Labile BP.  His BP can drop more than 30 points suddenly per wife. We have to be careful with meds.  Suspect some component of anxiety.   His SBP increased  up to 180. BP was check after they draw blood. repeated BP decreased to 145.  No chest pain.  He has had fluctuation of BP for years per patient and wife.  Recommend BP diary-- follow up with PCP. If BP persistently elevated he might required As needed BP medications. Could use Norvasc, that wont affect electrolytes. Would defer further evaluation of labile  BP to   Hyponatremia; Resume IV fluids for 12 hours. Improved.    Mild Hyperkalemia.  Resolved.  Hypomagnesemia; Replaced.  Dysphagia, Nutrition;  Continue home tube feeding.  Nausea, vomiting;  Suspect related to cough, increase resp secretions .  KUB. negative PRN Zofran.    GERD; PPI.    Anemia;  Hb on admission was 12. Down to 9.7.  Baseline 10 Suspect hb elevated on admission secondary to hemoconcentration from dehydration.  anemia panel: iron low.  Discharge on iron supplement.    EKG; no significant ST elevation. Similar to prior EKG Denies chest pain.    Estimated body mass index is 21.84 kg/m as calculated from the following:   Height as of this encounter: 6' (1.829 m).   Weight as of this encounter: 73 kg.          Consultants: None Procedures performed: None Disposition: Home Diet recommendation:  NPO on tube feeding.  DISCHARGE MEDICATION: Allergies as of 12/30/2022       Reactions   Codeine Nausea And Vomiting        Medication List     TAKE these medications    albuterol (2.5 MG/3ML) 0.083% nebulizer solution Commonly known as: PROVENTIL Inhale 3 mLs (2.5 mg total) by nebulization  every 4 (four) hours as needed for wheezing.   cetirizine 10 MG chewable tablet Commonly known as: ZYRTEC Place 10 mg into feeding tube daily as needed for allergies or rhinitis.   diphenhydrAMINE 25 MG tablet Commonly known as: BENADRYL Take 12.5 mg by mouth at bedtime as needed for allergies (to dry secretions).   feeding supplement (OSMOLITE 1.5 CAL) Liqd Place 1,425 mLs into feeding tube daily.   ferrous sulfate 300 (60 Fe) MG/5ML syrup Take 5 mLs (300 mg total) by mouth daily.   FLORASTOR PO Take 250 mg by mouth daily.   fluticasone 50 MCG/ACT nasal spray Commonly known as: FLONASE SPRAY TWO SPRAYS IN EACH NOSTRIL ONCE DAILY AS NEEDED What changed:  how much to take how to take this when to take this additional instructions   guaiFENesin 100 MG/5ML liquid Commonly known as: ROBITUSSIN Place 30 mLs into feeding tube 2 (two) times daily. What changed:  how much to take when to take this reasons to take this   loperamide 2 MG capsule Commonly known as: IMODIUM 2 mg See admin instructions. Place 2 mg into feeding tube daily as needed for diarrhea. Usually given during a feeding.   midodrine 2.5 MG tablet Commonly known as: PROAMATINE Take 2.5 mg by mouth at bedtime as needed (low BP).   omeprazole 40 MG capsule Commonly known as: PRILOSEC Removed the granules from the capsule and place him in the G-tube daily. What changed:  how much to take when to take this additional instructions   ondansetron 4 MG tablet Commonly known as: ZOFRAN Take 1 tablet (4 mg total) by mouth every 6 (six) hours as needed for nausea or vomiting.   Pedialyte Soln Place 355 mLs into feeding tube daily.   sodium chloride 0.65 % Soln nasal spray Commonly known as: OCEAN Place 1 spray into both nostrils as needed for congestion.   sodium chloride HYPERTONIC 3 % nebulizer solution Take 4 mLs by nebulization daily. Start taking on: December 31, 2022    sulfamethoxazole-trimethoprim 200-40 MG/5ML suspension Commonly known as: BACTRIM Place 20 mLs into feeding tube every 12 (twelve) hours.   Systane Ultra PF 0.4-0.3 % Soln Generic drug: Polyethyl Glyc-Propyl Glyc PF Place 1 drop into both eyes daily as needed (for irritation).        Discharge Exam: Filed Weights   12/27/22 0933 12/30/22 0359  Weight: 73 kg 77.1 kg   General; NAD  Condition at discharge: stable  The results of significant diagnostics from this hospitalization (including imaging, microbiology, ancillary and laboratory) are listed below for reference.   Imaging Studies: DG Abd 1 View  Result Date: 12/27/2022 CLINICAL DATA:  Nausea and vomiting, G-tube EXAM: ABDOMEN - 1 VIEW COMPARISON:  10/07/2022 abdominal radiograph. FINDINGS: Percutaneous gastrostomy tube terminates over the body of the stomach. No dilated small bowel loops. Mild colonic gas in stool. No evidence of pneumatosis or pneumoperitoneum. Chronic prominent elevation of the right hemidiaphragm. No radiopaque nephrolithiasis. IMPRESSION: Nonobstructive bowel gas pattern. Electronically Signed   By: Ilona Sorrel M.D.   On: 12/27/2022 14:22   DG Chest Port 1 View  Result Date: 12/27/2022 CLINICAL DATA:  Vomiting EXAM: PORTABLE  CHEST 1 VIEW COMPARISON:  Chest radiograph dated 12/11/2022 FINDINGS: Similar asymmetric elevation of the right hemidiaphragm. Interval decreased conspicuity of left lung opacities with persistent right lower lung patchy and linear opacities. No pleural effusion or pneumothorax. Similar cardiomediastinal silhouette. The visualized skeletal structures are unremarkable. IMPRESSION: Interval decreased conspicuity of left lung opacities with persistent right lower lung patchy and linear opacities, which may reflect a combination of atelectasis, aspiration, and/or pneumonia. Electronically Signed   By: Darrin Nipper M.D.   On: 12/27/2022 09:55   DG Chest Port 1 View  Result Date:  12/11/2022 CLINICAL DATA:  Sepsis, shortness of breath EXAM: PORTABLE CHEST 1 VIEW COMPARISON:  11/12/2022 FINDINGS: Stable heart size. Chronic elevation of the right hemidiaphragm. Dense airspace consolidation within the periphery of the left mid lung. Additional airspace opacity at the right lung base. No pleural effusion or pneumothorax. IMPRESSION: Airspace consolidations within the left mid lung and right lung base, most compatible with multifocal pneumonia. Electronically Signed   By: Davina Poke D.O.   On: 12/11/2022 09:08    Microbiology: Results for orders placed or performed during the hospital encounter of 12/27/22  Culture, blood (routine x 2)     Status: None (Preliminary result)   Collection Time: 12/27/22  9:45 AM   Specimen: BLOOD  Result Value Ref Range Status   Specimen Description   Final    BLOOD BLOOD LEFT FOREARM Performed at [facility] [address] [place], [place] [address]    Special Requests   Final    BOTTLES DRAWN AEROBIC AND ANAEROBIC Blood Culture adequate volume Performed at [facility] [address] [place], [place] [address]    Culture   Final    NO GROWTH 3 DAYS Performed at [facility], [place] [address] [place], [place] [address]    Report Status PENDING  Incomplete  Culture, blood (routine x 2)     Status: None (Preliminary result)   Collection Time: 12/27/22 10:00 AM   Specimen: BLOOD  Result Value Ref Range Status   Specimen Description   Final    BLOOD BLOOD RIGHT FOREARM Performed at [facility] [address] [place], [place] [address]    Special Requests   Final    BOTTLES DRAWN AEROBIC AND ANAEROBIC Blood Culture results may not be optimal due to an excessive volume of blood received in culture bottles Performed at [facility] [address], [place], [place] [address]    Culture   Final    NO GROWTH 3 DAYS Performed at [facility], [place] [address], [place], [place] [address]    Report Status PENDING  Incomplete  Expectorated Sputum Assessment w Gram Stain, Rflx to Resp Cult     Status: None   Collection Time: 12/27/22  2:23 PM   Specimen: Expectorated Sputum  Result Value Ref Range Status   Specimen Description EXPECTORATED SPUTUM  Final   Special Requests Normal  Final   Sputum evaluation   Final    THIS SPECIMEN IS ACCEPTABLE FOR SPUTUM CULTURE Performed at [facility], [place] [address], [place], [place] [address]    Report Status 12/27/2022 FINAL  Final  Culture, Respiratory w Gram Stain     Status: None (Preliminary result)   Collection Time: 12/27/22  2:23 PM  Result Value Ref Range Status   Specimen Description   Final    EXPECTORATED SPUTUM Performed at [facility],  [place] [address], [place], [place] [address]    Special Requests   Final    Normal Reflexed from [id number] Performed at [facility], [place] [address] [place], [place] [address]    Gram Stain   Final    FEW WBC PRESENT, PREDOMINANTLY PMN FEW GRAM NEGATIVE RODS Performed at [facility], [place] [address], [place], [place] [address]    Culture   Final    MODERATE ENTEROBACTER CLOACAE FEW ESCHERICHIA COLI    Report Status PENDING  Incomplete   Organism ID, Bacteria ENTEROBACTER CLOACAE  Final   Organism ID, Bacteria ESCHERICHIA COLI  Final      Susceptibility   Enterobacter cloacae - MIC*    CEFAZOLIN >=64 RESISTANT Resistant     CEFEPIME <=0.12 SENSITIVE Sensitive     CEFTAZIDIME <=1 SENSITIVE Sensitive     CIPROFLOXACIN <=0.25 SENSITIVE Sensitive     GENTAMICIN <=1 SENSITIVE Sensitive     IMIPENEM 0.5 SENSITIVE Sensitive     TRIMETH/SULFA <=20 SENSITIVE Sensitive     PIP/TAZO <=4 SENSITIVE Sensitive     * MODERATE ENTEROBACTER CLOACAE   Escherichia coli - MIC*    AMPICILLIN >=32 RESISTANT Resistant     CEFAZOLIN 8 SENSITIVE Sensitive     CEFEPIME <=0.12 SENSITIVE Sensitive      CEFTAZIDIME <=1 SENSITIVE Sensitive     CEFTRIAXONE 1 SENSITIVE Sensitive     CIPROFLOXACIN <=0.25 SENSITIVE Sensitive     GENTAMICIN <=1 SENSITIVE Sensitive     IMIPENEM <=0.25 SENSITIVE Sensitive     TRIMETH/SULFA <=20 SENSITIVE Sensitive     AMPICILLIN/SULBACTAM >=32 RESISTANT Resistant     PIP/TAZO 8 SENSITIVE Sensitive     * FEW ESCHERICHIA COLI  MRSA Next Gen by PCR, Nasal     Status: None   Collection Time: 12/27/22  2:30 PM   Specimen: Nasal Mucosa; Nasal Swab  Result Value Ref Range Status   MRSA by PCR Next Gen NOT DETECTED NOT DETECTED Final    Comment: (NOTE) The GeneXpert MRSA Assay (FDA approved for NASAL specimens only), is one component of a comprehensive MRSA colonization surveillance program. It is not intended to diagnose MRSA infection nor to guide or monitor treatment for MRSA infections. Test performance is not FDA approved in patients less than 68 years old. Performed at [facility], [place] [address], [place],  [address]     Labs: CBC: Recent Labs  Lab 12/27/22 1010 12/28/22 0755  WBC 12.5* 8.1  NEUTROABS 11.1*  --   HGB 12.0* 9.7*  HCT 38.2* 30.5*  MCV 89.0 89.2  PLT 338 [id number]   Basic Metabolic Panel: Recent Labs  Lab 12/27/22 1010 12/28/22 0755 12/28/22 1634 12/29/22 0530 12/29/22 1645 12/30/22 0748  NA 133* 133*  --  129*  --  131*  K 5.2* 4.2  --  4.0  --  4.2  CL 95* 99  --  93*  --  97*  CO2 25 26  --  26  --  25  GLUCOSE 104* 101*  --  105*  --  111*  BUN 37* 20  --  18  --  20  CREATININE 1.13 0.73  --  0.89  --  0.89  CALCIUM 9.2 8.7*  --  8.6*  --  8.5*  MG  --   --  1.7 1.8 2.5* 2.3  PHOS  --   --  2.8 3.4 3.8 3.8   Liver Function Tests: Recent Labs  Lab 12/27/22  1010  AST 30  ALT 19  ALKPHOS 80  BILITOT 0.6  PROT 8.5*  ALBUMIN 3.6   CBG: Recent Labs  Lab 12/29/22 2114 12/30/22 0012 12/30/22 0401 12/30/22 0723 12/30/22 1206  GLUCAP 116* 129* 88 137* 98    Discharge time spent:  greater than 30 minutes.  Signed: Elmarie Shiley, MD [facility] 12/30/2022

## 2022-12-30 NOTE — Progress Notes (Signed)
Brief Nutrition Note  Patient has been tolerating 24 hour continuous feeds at 33m/hr. Per discussion with MD, patient to discharge home today. MD requesting RD discuss transitioning back to home TF regimen with patient and wife.   Met with patient and wife at bedside. Discussed below advancement schedule and provided written copy of advancements. Both endorsed understanding, felt this could work. Wife indicates patient doesn't run them at night due to concern for aspiration so may need to adjust hours as tolerated.   -Step 1: Osmolite 1.5 at 72 mL/hour x 20 hours -Step 2: Osmolite 1.5 at 85 mL/hour x 17 hours -Step 3: Osmolite 1.5 at 95 mL/hour x 15 hours from 0600-2100 (goal) -PROSource TF20 60 mL daily Provides: 2218 kcal, 109 grams of protein, 1086 mL H2O daily With free water flush of 150 mL 5 times daily, will provide a total of 1836 mL H2O daily.  Patient had been tolerating 110 mL/hour x 13 hours at home and would like to get back to that if possible. However, main concern is trying to get back to bolus TF.  Discussed ordering outpatient dietitian referral to help patient slowly transition if able. Wife and patient appreciative, no questions or concerns at end of visit.  ASamson FredericRD, LDN For contact information, refer to [facility]

## 2022-12-30 NOTE — Progress Notes (Signed)
Mobility Specialist - Progress Note   12/30/22 1113  Mobility  Activity Ambulated with assistance in hallway  Level of Assistance Independent after set-up  Assistive Device Other (Comment) (IV Pole)  Distance Ambulated (ft) 500 ft  Activity Response Tolerated well  Mobility Referral Yes  $Mobility charge 1 Mobility   Pt received in bed and agreeable to mobility. No complaints during session. Pt to bed after session with all needs met.   [facility]

## 2022-12-30 NOTE — Plan of Care (Signed)
  Problem: Education: Goal: Knowledge of General Education information will improve Description: Including pain rating scale, medication(s)/side effects and non-pharmacologic comfort measures Outcome: Progressing   Problem: Clinical Measurements: Goal: Ability to maintain clinical measurements within normal limits will improve Outcome: Progressing Goal: Diagnostic test results will improve Outcome: Progressing Goal: Respiratory complications will improve Outcome: Progressing   Problem: Activity: Goal: Risk for activity intolerance will decrease Outcome: Progressing   Problem: Nutrition: Goal: Adequate nutrition will be maintained Outcome: Progressing   Problem: Elimination: Goal: Will not experience complications related to bowel motility Outcome: Progressing   Problem: Pain Managment: Goal: General experience of comfort will improve Outcome: Progressing   Problem: Safety: Goal: Ability to remain free from injury will improve Outcome: Progressing   Problem: Skin Integrity: Goal: Risk for impaired skin integrity will decrease Outcome: Progressing

## 2022-12-30 NOTE — Progress Notes (Signed)
Pt discharging home with no needs. IV was discontinued. AVS was given and explained to patient and patient wife. Pt and wife were given time to ask questions. Medications were delivered by [facility]. All belongings were packed and sent home with patient and patient wife. Pt wife at bedside to transport pt home. RN accompanied pt via wheelchair to main entrance to be transported home.

## 2022-12-30 NOTE — Plan of Care (Signed)

## 2022-12-30 NOTE — Evaluation (Signed)
Clinical/Bedside Swallow Evaluation Patient Details  Name: Lee Barnes MRN: [id number] Date of Birth: 06-20-1951  Today's Date: 12/30/2022 Time: SLP Start Time (ACUTE ONLY): [id number] SLP Stop Time (ACUTE ONLY): 0910 SLP Time Calculation (min) (ACUTE ONLY): 15 min  Past Medical History:  Past Medical History:  Diagnosis Date   Allergy to environmental factors    Basal cell carcinoma of left cheek 0000000   Complication of anesthesia    "vageled" after surgery -overnite stay   Diverticulosis 2008   Heart murmur    hx of in childhood    History of chemotherapy    History of radiation therapy 11/05/10-12/26/10   r base tongue, 7000 cGy 35 sessions   Hypertension    Medication started in May 2016.   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   Tinnitus    Past Surgical History:  Past Surgical History:  Procedure Laterality Date   APPENDECTOMY     COLONOSCOPY     INGUINAL HERNIA REPAIR Bilateral 03/30/2015   Procedure: LAPAROSCOPIC BILATERAL INGUINAL HERNIA REPAIR WITH MESH;  Surgeon: Coralie Keens, MD;  Location: [facility];  Service: General;  Laterality: Bilateral;   INGUINAL HERNIA REPAIR Right 11/18/1958   INGUINAL HERNIA REPAIR Bilateral 03/30/2015   INGUINAL HERNIA REPAIR Left 07/11/2015   INGUINAL HERNIA REPAIR N/A 07/11/2015   Procedure: REPAIR OF LEFT INGUINAL HERNIA WITH MESH;  Surgeon: Coralie Keens, MD;  Location: [facility];  Service: General;  Laterality: N/A;   INSERTION OF MESH N/A 07/11/2015   Procedure: INSERTION OF MESH;  Surgeon: Coralie Keens, MD;  Location: [facility];  Service: General;  Laterality: N/A;   IR [place] W/FLUORO  12/03/2021   LUMBAR [place] SURGERY     PEG PLACEMENT N/A 03/22/2022   Procedure: PERCUTANEOUS ENDOSCOPIC GASTROSTOMY (PEG) REPLACEMENT;  Surgeon: Carol Ada, MD;  Location: [facility];  Service: Gastroenterology;  Laterality: N/A;   SAVORY  DILATION N/A 03/22/2022   Procedure: SAVORY DILATION;  Surgeon: Carol Ada, MD;  Location: [facility];  Service: Gastroenterology;  Laterality: N/A;   SKIN BIOPSY Left 06/24/2022   basal cell carcinoma face cheek   TONSILLECTOMY     HPI:  Patient is a 72 y.o. 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 He was recently admitted 12/13/22 for aspiration PNA. He presented to the hospital on 12/27/22 with vomiting, episode of aspiration PNA and hypotension. Vomiting episode likely from patient having coughing up phlegm which was thick, yellowish. Oxygen dropped to 80% after vomiting episodes. SLP evaluation of swallow ordered to determine if patient was safe to have ice chips.    Assessment / Plan / Recommendation  Clinical Impression  Patient not currently presenting with any significant changes in his oropharyngeal dysphagia. He reports that he does not take any PO's at home, even for pleasure but will occasionally put some water or ice chip in his mouth, swish around/move around in his mouth, then spit it out. He also reported that he stops his tube feedings (on continuous feeds during day) at 7 or 8pm and goes to bed around 1030pm. His main complaint currently is he is unable to effectively clear his secretions and he described incidents of pulling a long string of mucous from his throat. He also stated that the coughing from phlegm in throat leads to  gagging and then vomiting. SLP observed patient with a single ice chip, which he let dissolve in mouth and as it was not a signifcant amount of water, he swallowed it. Patient is fully aware and has previously been extensively educated regarding his swallow funciton and safety. SLP recommending to allow patient to have ice chips at bedside for comfort/pleasure. No f/u recommended. SLP Visit Diagnosis: Dysphagia, oropharyngeal phase  (R13.12)    Aspiration Risk  Severe aspiration risk    Diet Recommendation NPO;Other (Comment) (patient may have ice chips at bedside for comfort)   Medication Administration: Via alternative means Postural Changes: Seated upright at 90 degrees    Other  Recommendations Oral Care Recommendations: Oral care QID;Oral care prior to ice chip/H20    Recommendations for follow up therapy are one component of a multi-disciplinary discharge planning process, led by the attending physician.  Recommendations may be updated based on patient status, additional functional criteria and insurance authorization.  Follow up Recommendations No SLP follow up      Assistance Recommended at Discharge    Functional Status Assessment Patient has not had a recent decline in their functional status  Frequency and Duration   N/A         Prognosis   N/A     Swallow Study   General Date of Onset: 12/27/22 HPI: Patient is a 72 y.o. 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 He was recently admitted 12/13/22 for aspiration PNA. He presented to the hospital on 12/27/22 with vomiting, episode of aspiration PNA and hypotension. Vomiting episode likely from patient having coughing up phlegm which was thick, yellowish. Oxygen dropped to 80% after vomiting episodes. SLP evaluation of swallow ordered to determine if patient was safe to have ice chips. Type of Study: Bedside Swallow Evaluation Previous Swallow Assessment: most recent SLP intervention was OP treatment session 12/01/2021 Diet Prior to this Study: NPO Temperature Spikes Noted: No Respiratory Status: Room air History of Recent Intubation: No Behavior/Cognition: Alert;Cooperative;Pleasant mood Oral Cavity Assessment: Dry Oral Care Completed by SLP: No Oral Cavity - Dentition: Adequate natural dentition Vision: Functional for  self-feeding Self-Feeding Abilities: Able to feed self Patient Positioning: Upright in bed Baseline Vocal Quality: Hoarse;Normal Volitional Cough: Strong Volitional Swallow: Able to elicit    Oral/Motor/Sensory Function Overall Oral Motor/Sensory Function: Other (comment) (impairment from h/o chemoradiation)   Ice Chips Presentation: Self Fed;Spoon Other Comments: patient lets ice melt in mouth and typically will spit it out   Thin Liquid Thin Liquid: Not tested    Nectar Thick Nectar Thick Liquid: Not tested   Honey Thick Honey Thick Liquid: Not tested   Puree Puree: Not tested   Solid     Solid: Not tested     Sonia Baller, MA, CCC-SLP Speech Therapy

## 2022-12-31 ENCOUNTER — Other Ambulatory Visit (HOSPITAL_COMMUNITY): Payer: Self-pay

## 2022-12-31 ENCOUNTER — Telehealth: Payer: Self-pay | Admitting: Family Medicine

## 2022-12-31 LAB — CULTURE, RESPIRATORY W GRAM STAIN: Special Requests: NORMAL

## 2022-12-31 NOTE — Telephone Encounter (Signed)
Pt's made an appt thru mychart for hospital follow up for 01/02/2023. She states palliative care was mentioned when pt was in hospital and she has questions. Please advise.

## 2023-01-01 ENCOUNTER — Other Ambulatory Visit (HOSPITAL_BASED_OUTPATIENT_CLINIC_OR_DEPARTMENT_OTHER): Payer: Self-pay

## 2023-01-01 LAB — CULTURE, BLOOD (ROUTINE X 2)
Culture: NO GROWTH
Culture: NO GROWTH
Special Requests: ADEQUATE

## 2023-01-02 ENCOUNTER — Encounter: Payer: Self-pay | Admitting: Family Medicine

## 2023-01-02 ENCOUNTER — Ambulatory Visit: Payer: Medicare Other | Admitting: Family Medicine

## 2023-01-02 ENCOUNTER — Other Ambulatory Visit (HOSPITAL_COMMUNITY): Payer: Self-pay

## 2023-01-02 ENCOUNTER — Other Ambulatory Visit: Payer: Medicare Other

## 2023-01-02 DIAGNOSIS — Z515 Encounter for palliative care: Secondary | ICD-10-CM

## 2023-01-02 NOTE — Progress Notes (Signed)
COMMUNITY PALLIATIVE CARE SW NOTE  PATIENT NAME: Lee Barnes DOB: 09/12/1951 MRN: [id number]  PRIMARY CARE PROVIDER: Denita Lung, MD  RESPONSIBLE PARTY:  Acct ID - Guarantor Home Phone Work Phone Relationship Acct Type  [id number] - [name],[name] [phone]  Self P/F     [place], Lee Barnes, [place] [address]   Initial Palliative Social Work Telephonic Encounter  PC SW completed a telephonic visit with patient's wife. She was provided education regarding the palliative care program, role in patient's care and visit frequency. His wife expressed frustration and caregiver fatigue as she believe that patient is very far in his disease progression. She shared that patient is aspirating on everything and has had several bouts with pneumonia. Patient is on a feeding tube. He has a lot of anxiety around advance care and end-of-life discussions where he is vomiting. He had a cancer diagnosis 12 years ago, but does not have active cancer diagnosis. SW provided active listening and reassurance of support.   SW scheduled a follow-up visit with the clinical team for 01/08/23 @ 11am.     Social History   Tobacco Use   Smoking status: Never   Smokeless tobacco: Never  Substance Use Topics   Alcohol use: Not Currently    Comment: occas drinks a beer every few weeks     CODE STATUS: Full Code ADVANCED DIRECTIVES: No MOST FORM COMPLETE:  No HOSPICE EDUCATION PROVIDED: Yes, to wife  Duration of  telephonic visit and documentation: 30 minutes  Lee Tirth, LCSW

## 2023-01-02 NOTE — Telephone Encounter (Signed)
Called pt's wife regarding the appt that she scheduled & I explained that pt has been referred for Pallative care & when that happens they handle all of pt's care & he would no longer be seen here while under their care.  Wife didn't know this and said she doesn't have an appt with them until next Wednesday after noon & she will discuss this with [name].

## 2023-01-08 ENCOUNTER — Other Ambulatory Visit: Payer: Medicare Other

## 2023-01-08 VITALS — BP 108/72 | HR 88 | Resp 20

## 2023-01-08 DIAGNOSIS — Z515 Encounter for palliative care: Secondary | ICD-10-CM

## 2023-01-08 NOTE — Progress Notes (Signed)
[facility] Initial Encounter Note   PATIENT NAME: Lee Barnes DOB: 1951/09/23 MRN: [id number]  PRIMARY CARE PROVIDER: Denita Lung, MD  RESPONSIBLE PARTY:  Acct ID - Guarantor Home Phone Work Phone Relationship Acct Type  [id number] - Ellerbe,[name] [phone]  Self P/F     [place], [address], [place] [phone]   RN & M.Lon[place],SW completed home visit. Wife also present .   HISTORY OF PRESENT ILLNESS:  72 y.o. male with medical history significant of squamous cell carcinoma oropharyngeal  S/P Peg tube placement, recently treated for aspiration PNA Jan 2023, HTN, GERD - discharged 12/30/22.   Socially: Lives in one story home with wife. Has lived here for 15 years. Has 3 daughters and 7 grandchildren.    Cognitive: Alert and appropriately answers questions and participates in conversations. HOH bilaterally, has a hearing aid to left ear.  Appetite: Has tube feeding at 85 to 110 cc/hr for 14 hrs per day. Has worsening issues with GERD. Has had aspiration pna 11 times in last 2 years.    Mobility: Reports weakness now. Pt doesn't leave the house unless he is going to doctors visits or the ER. Does not use assistive devices to ambulate. Denies any falls.   Sleeping Pattern: Reports sleeping about 2 hrs at a time interrupted for bathroom needs. Denies any issues going back to sleep. Takes  a Benadryl for sleep. Has a humidifier in bedroom. Takes a breathing treatment at night before bed. Wife reports that pt takes many naps during the day. Bed is adjustable and pt sits   Pain: Denies any pain. "never have pain".   Palliative Care/ Hospice: RN explained role and purpose of palliative care including visit frequency. Also discussed benefits of hospice care as well as the differences between the two with patient.  108/72, 96%,  Goals of Care:  Wants to get back on bolus feeds and start to leave the house again  Next appt scheduled for 02/03/23- 11am (T/C)  CODE STATUS:  Full Code ADVANCED DIRECTIVES: N MOST FORM: No. Form with explanation and instructions given to pt and wife to review with doctor. PPS:    PHYSICAL EXAM:   VITALS: Today's Vitals   01/08/23 1140  BP: 108/72  Pulse: 88  Resp: 20  SpO2: 97%  PainSc: 0-No pain    LUNGS: clear to auscultate bilaterally. Does have some congestion in upper airway that clears with cough. CARDIAC: RRR, no JVD  EXTREMITIES: MAE x 4 , no edema SKIN: slight redness noted at bottom of peg tube insertion site.      Jacqulyn Cane, RN

## 2023-01-09 ENCOUNTER — Other Ambulatory Visit (HOSPITAL_COMMUNITY): Payer: Self-pay

## 2023-01-14 ENCOUNTER — Other Ambulatory Visit: Payer: Self-pay

## 2023-01-14 NOTE — Progress Notes (Signed)
COMMUNITY PALLIATIVE CARE SW NOTE  PATIENT NAME: Lee Barnes DOB: [birth year] MRN: [id number]  PRIMARY CARE PROVIDER: Denita Lung, MD  RESPONSIBLE PARTY:  Acct ID - Guarantor Home Phone Work Phone Relationship Acct Type  [id number] - Castilla,[name] [phone]  Self P/F     [place], [address], [place] [address]   Initial Palliative Care Visit/Clinical Social Work  Patient and his wife was present for this initial visit. They were provided education regarding palliative care services, role in patient's care, visit frequency and contact information. They were presented the consent form and signed in agreement with the services.   Patient has had four hospitalizations since November. Patient had recent hospitalization (4 days). Patient has reflux, and has thick secretions. Patient is a hard of hearing, but alert and oriented x3.  Patient started continuous tube feeding since November. Since getting the secretions thinned down, he has done better. Everything is administered through his feeding tube.  Patient and his wife have stopped going out all. Patient desires to get out more.  Patient denies pain. He report that he is sleeping well and takes a half of Benadryl at night. He also naps during the day.   Goals of Care: If for patient to get back to bolus feeding and to be able to get back outside.   SOCIAL HX: Patient was born and raised in [place]. He graduated from [employer]. He was self-employed Patient is married (66 yrs.) with three daughters. Patient has lived in their home for 15 years. Patient is Methodist by [employer]. Patient has HCPOA/POA-wife.  Social History   Tobacco Use   Smoking status: Never   Smokeless tobacco: Never  Substance Use Topics   Alcohol use: Not Currently    Comment: occas drinks a beer every few weeks     CODE STATUS: DNR ADVANCED DIRECTIVES: No MOST FORM COMPLETE:  Education provided, form left in the home HOSPICE EDUCATION PROVIDED: No  Duration  of visit and documentation: 60 minutes  Takera Rayl, LCSW

## 2023-01-15 ENCOUNTER — Other Ambulatory Visit (HOSPITAL_COMMUNITY): Payer: Self-pay

## 2023-01-16 ENCOUNTER — Ambulatory Visit: Payer: Medicare Other | Admitting: Family Medicine

## 2023-01-16 ENCOUNTER — Ambulatory Visit (INDEPENDENT_AMBULATORY_CARE_PROVIDER_SITE_OTHER): Payer: Medicare Other | Admitting: Family Medicine

## 2023-01-16 ENCOUNTER — Encounter: Payer: Self-pay | Admitting: Family Medicine

## 2023-01-16 VITALS — BP 130/76 | HR 93 | Ht 72.0 in | Wt 169.2 lb

## 2023-01-16 DIAGNOSIS — Z85819 Personal history of malignant neoplasm of unspecified site of lip, oral cavity, and pharynx: Secondary | ICD-10-CM

## 2023-01-16 DIAGNOSIS — Z931 Gastrostomy status: Secondary | ICD-10-CM | POA: Diagnosis not present

## 2023-01-16 DIAGNOSIS — Z8701 Personal history of pneumonia (recurrent): Secondary | ICD-10-CM

## 2023-01-16 MED ORDER — ALBUTEROL SULFATE (2.5 MG/3ML) 0.083% IN NEBU: 2.5000 mg | INHALATION_SOLUTION | RESPIRATORY_TRACT | 5 refills | Status: DC | PRN

## 2023-01-16 NOTE — Progress Notes (Signed)
   Subjective:    Patient ID: Lee Barnes, male    DOB: 1951-10-09, 72 y.o.   MRN: [id number]  HPI He is here with his wife for follow-up visit.  They have been using albuterol inhaler twice per day and they state that his has cut down on his secretions which makes it much easier to be taken care of.  He is also taking iron supplementation and increasing protein in his diet.  He does feel much stronger.   Review of Systems     Objective:   Physical Exam Alert and in no distress otherwise not examined       Assessment & Plan:  History of oropharyngeal cancer - Plan: albuterol (PROVENTIL) (2.5 MG/3ML) 0.083% nebulizer solution  G tube feedings (HCC)  History of aspiration pneumonia - Plan: albuterol (PROVENTIL) (2.5 MG/3ML) 0.083% nebulizer solution Since he is doing quite nicely on this albuterol I think it is appropriate to keep him on that especially if it keeps him out of the hospital.  He and his wife are both comfortable with this regimen.

## 2023-01-28 ENCOUNTER — Other Ambulatory Visit: Payer: Self-pay

## 2023-01-28 ENCOUNTER — Inpatient Hospital Stay (HOSPITAL_COMMUNITY)
Admission: EM | Admit: 2023-01-28 | Discharge: 2023-02-05 | DRG: 177 | Disposition: A | Discharge status: Home / Self Care | Payer: Medicare Other | Attending: Internal Medicine | Admitting: Internal Medicine

## 2023-01-28 ENCOUNTER — Emergency Department (HOSPITAL_COMMUNITY): Payer: Medicare Other

## 2023-01-28 ENCOUNTER — Encounter (HOSPITAL_COMMUNITY): Payer: Self-pay

## 2023-01-28 DIAGNOSIS — Z923 Personal history of irradiation: Secondary | ICD-10-CM

## 2023-01-28 DIAGNOSIS — E43 Unspecified severe protein-calorie malnutrition: Secondary | ICD-10-CM | POA: Diagnosis present

## 2023-01-28 DIAGNOSIS — I10 Essential (primary) hypertension: Secondary | ICD-10-CM | POA: Diagnosis not present

## 2023-01-28 DIAGNOSIS — Z85828 Personal history of other malignant neoplasm of skin: Secondary | ICD-10-CM | POA: Diagnosis not present

## 2023-01-28 DIAGNOSIS — Z85819 Personal history of malignant neoplasm of unspecified site of lip, oral cavity, and pharynx: Secondary | ICD-10-CM | POA: Diagnosis present

## 2023-01-28 DIAGNOSIS — R0602 Shortness of breath: Secondary | ICD-10-CM | POA: Diagnosis not present

## 2023-01-28 DIAGNOSIS — N281 Cyst of kidney, acquired: Secondary | ICD-10-CM | POA: Diagnosis not present

## 2023-01-28 DIAGNOSIS — Z789 Other specified health status: Secondary | ICD-10-CM | POA: Diagnosis present

## 2023-01-28 DIAGNOSIS — E876 Hypokalemia: Secondary | ICD-10-CM | POA: Diagnosis present

## 2023-01-28 DIAGNOSIS — Z1611 Resistance to penicillins: Secondary | ICD-10-CM | POA: Diagnosis present

## 2023-01-28 DIAGNOSIS — Z85810 Personal history of malignant neoplasm of tongue: Secondary | ICD-10-CM

## 2023-01-28 DIAGNOSIS — K219 Gastro-esophageal reflux disease without esophagitis: Secondary | ICD-10-CM | POA: Diagnosis present

## 2023-01-28 DIAGNOSIS — R112 Nausea with vomiting, unspecified: Secondary | ICD-10-CM | POA: Diagnosis present

## 2023-01-28 DIAGNOSIS — E875 Hyperkalemia: Secondary | ICD-10-CM | POA: Diagnosis present

## 2023-01-28 DIAGNOSIS — D649 Anemia, unspecified: Secondary | ICD-10-CM | POA: Diagnosis not present

## 2023-01-28 DIAGNOSIS — R1312 Dysphagia, oropharyngeal phase: Secondary | ICD-10-CM | POA: Diagnosis present

## 2023-01-28 DIAGNOSIS — Z6822 Body mass index (BMI) 22.0-22.9, adult: Secondary | ICD-10-CM

## 2023-01-28 DIAGNOSIS — Z515 Encounter for palliative care: Secondary | ICD-10-CM | POA: Diagnosis not present

## 2023-01-28 DIAGNOSIS — Z885 Allergy status to narcotic agent status: Secondary | ICD-10-CM | POA: Diagnosis not present

## 2023-01-28 DIAGNOSIS — Z9221 Personal history of antineoplastic chemotherapy: Secondary | ICD-10-CM

## 2023-01-28 DIAGNOSIS — H9190 Unspecified hearing loss, unspecified ear: Secondary | ICD-10-CM | POA: Diagnosis present

## 2023-01-28 DIAGNOSIS — J690 Pneumonitis due to inhalation of food and vomit: Principal | ICD-10-CM | POA: Diagnosis present

## 2023-01-28 DIAGNOSIS — Z79899 Other long term (current) drug therapy: Secondary | ICD-10-CM

## 2023-01-28 DIAGNOSIS — B9620 Unspecified Escherichia coli [E. coli] as the cause of diseases classified elsewhere: Secondary | ICD-10-CM | POA: Diagnosis present

## 2023-01-28 DIAGNOSIS — J9601 Acute respiratory failure with hypoxia: Principal | ICD-10-CM

## 2023-01-28 DIAGNOSIS — Z808 Family history of malignant neoplasm of other organs or systems: Secondary | ICD-10-CM | POA: Diagnosis not present

## 2023-01-28 DIAGNOSIS — Z1152 Encounter for screening for COVID-19: Secondary | ICD-10-CM | POA: Diagnosis not present

## 2023-01-28 DIAGNOSIS — R1310 Dysphagia, unspecified: Secondary | ICD-10-CM

## 2023-01-28 DIAGNOSIS — Z931 Gastrostomy status: Secondary | ICD-10-CM

## 2023-01-28 DIAGNOSIS — J189 Pneumonia, unspecified organism: Secondary | ICD-10-CM | POA: Diagnosis not present

## 2023-01-28 DIAGNOSIS — E871 Hypo-osmolality and hyponatremia: Secondary | ICD-10-CM | POA: Diagnosis present

## 2023-01-28 DIAGNOSIS — Z743 Need for continuous supervision: Secondary | ICD-10-CM | POA: Diagnosis not present

## 2023-01-28 DIAGNOSIS — Z974 Presence of external hearing-aid: Secondary | ICD-10-CM

## 2023-01-28 DIAGNOSIS — Z9049 Acquired absence of other specified parts of digestive tract: Secondary | ICD-10-CM

## 2023-01-28 DIAGNOSIS — R0902 Hypoxemia: Secondary | ICD-10-CM | POA: Diagnosis not present

## 2023-01-28 DIAGNOSIS — K5730 Diverticulosis of large intestine without perforation or abscess without bleeding: Secondary | ICD-10-CM | POA: Diagnosis not present

## 2023-01-28 DIAGNOSIS — R1110 Vomiting, unspecified: Secondary | ICD-10-CM | POA: Diagnosis not present

## 2023-01-28 DIAGNOSIS — J9811 Atelectasis: Secondary | ICD-10-CM | POA: Diagnosis not present

## 2023-01-28 DIAGNOSIS — J811 Chronic pulmonary edema: Secondary | ICD-10-CM | POA: Diagnosis not present

## 2023-01-28 LAB — CBC WITH DIFFERENTIAL/PLATELET
Abs Immature Granulocytes: 0.08 10*3/uL — ABNORMAL HIGH (ref 0.00–0.07)
Basophils Absolute: 0 10*3/uL (ref 0.0–0.1)
Basophils Relative: 0 %
Eosinophils Absolute: 0.1 10*3/uL (ref 0.0–0.5)
Eosinophils Relative: 0 %
HCT: 43.4 % (ref 39.0–52.0)
Hemoglobin: 13.5 g/dL (ref 13.0–17.0)
Immature Granulocytes: 1 %
Lymphocytes Relative: 2 %
Lymphs Abs: 0.4 10*3/uL — ABNORMAL LOW (ref 0.7–4.0)
MCH: 27.9 pg (ref 26.0–34.0)
MCHC: 31.1 g/dL (ref 30.0–36.0)
MCV: 89.7 fL (ref 80.0–100.0)
Monocytes Absolute: 0.6 10*3/uL (ref 0.1–1.0)
Monocytes Relative: 3 %
Neutro Abs: 15.8 10*3/uL — ABNORMAL HIGH (ref 1.7–7.7)
Neutrophils Relative %: 94 %
Platelets: 256 10*3/uL (ref 150–400)
RBC: 4.84 MIL/uL (ref 4.22–5.81)
RDW: 16.2 % — ABNORMAL HIGH (ref 11.5–15.5)
WBC: 16.9 10*3/uL — ABNORMAL HIGH (ref 4.0–10.5)
nRBC: 0 % (ref 0.0–0.2)

## 2023-01-28 LAB — LACTIC ACID, PLASMA: Lactic Acid, Venous: 1.9 mmol/L (ref 0.5–1.9)

## 2023-01-28 LAB — COMPREHENSIVE METABOLIC PANEL
ALT: 20 U/L (ref 0–44)
AST: 29 U/L (ref 15–41)
Albumin: 4.3 g/dL (ref 3.5–5.0)
Alkaline Phosphatase: 108 U/L (ref 38–126)
Anion gap: 7 (ref 5–15)
BUN: 30 mg/dL — ABNORMAL HIGH (ref 8–23)
CO2: 27 mmol/L (ref 22–32)
Calcium: 8.9 mg/dL (ref 8.9–10.3)
Chloride: 94 mmol/L — ABNORMAL LOW (ref 98–111)
Creatinine, Ser: 0.86 mg/dL (ref 0.61–1.24)
GFR, Estimated: >60 mL/min (ref >60)
Glucose, Bld: 114 mg/dL — ABNORMAL HIGH (ref 70–99)
Potassium: 4.6 mmol/L (ref 3.5–5.1)
Sodium: 128 mmol/L — ABNORMAL LOW (ref 135–145)
Total Bilirubin: 0.6 mg/dL (ref 0.3–1.2)
Total Protein: 8.8 g/dL — ABNORMAL HIGH (ref 6.5–8.1)

## 2023-01-28 LAB — URINALYSIS, ROUTINE W REFLEX MICROSCOPIC
Bacteria, UA: NONE SEEN
Bilirubin Urine: NEGATIVE
Glucose, UA: NEGATIVE mg/dL
Hgb urine dipstick: NEGATIVE
Ketones, ur: NEGATIVE mg/dL
Leukocytes,Ua: NEGATIVE
Nitrite: NEGATIVE
Protein, ur: NEGATIVE mg/dL
Specific Gravity, Urine: 1.015 (ref 1.005–1.030)
pH: 7.5 (ref 5.0–8.0)

## 2023-01-28 LAB — PROTIME-INR
INR: 1.1 (ref 0.8–1.2)
Prothrombin Time: 13.6 seconds (ref 11.4–15.2)

## 2023-01-28 LAB — RESP PANEL BY RT-PCR (RSV, FLU A&B, COVID)  RVPGX2
Influenza A by PCR: NEGATIVE
Influenza B by PCR: NEGATIVE
Resp Syncytial Virus by PCR: NEGATIVE
SARS Coronavirus 2 by RT PCR: NEGATIVE

## 2023-01-28 MED ORDER — SODIUM CHLORIDE 0.9 % IV SOLN
3.0000 g | Freq: Once | INTRAVENOUS | Status: AC
  Administered 2023-01-29: 3 g via INTRAVENOUS
  Filled 2023-01-28: qty 8

## 2023-01-28 MED ORDER — ALBUTEROL SULFATE HFA 108 (90 BASE) MCG/ACT IN AERS: 2.0000 | INHALATION_SPRAY | RESPIRATORY_TRACT | Status: DC | PRN

## 2023-01-28 MED ORDER — ALBUTEROL SULFATE (2.5 MG/3ML) 0.083% IN NEBU
5.0000 mg | INHALATION_SOLUTION | Freq: Once | RESPIRATORY_TRACT | Status: AC
  Administered 2023-01-28: 5 mg via RESPIRATORY_TRACT
  Filled 2023-01-28: qty 6

## 2023-01-28 MED ORDER — METHYLPREDNISOLONE SODIUM SUCC 125 MG IJ SOLR
125.0000 mg | Freq: Once | INTRAMUSCULAR | Status: AC
  Administered 2023-01-28: 125 mg via INTRAVENOUS
  Filled 2023-01-28: qty 2

## 2023-01-28 MED ORDER — SODIUM CHLORIDE 0.9 % IV BOLUS
1000.0000 mL | Freq: Once | INTRAVENOUS | Status: AC
  Administered 2023-01-28: 1000 mL via INTRAVENOUS

## 2023-01-28 MED ORDER — MAGNESIUM SULFATE 2 GM/50ML IV SOLN
2.0000 g | Freq: Once | INTRAVENOUS | Status: AC
  Administered 2023-01-28: 2 g via INTRAVENOUS
  Filled 2023-01-28: qty 50

## 2023-01-28 MED ORDER — IPRATROPIUM BROMIDE 0.02 % IN SOLN
0.5000 mg | Freq: Once | RESPIRATORY_TRACT | Status: AC
  Administered 2023-01-28: 0.5 mg via RESPIRATORY_TRACT
  Filled 2023-01-28: qty 2.5

## 2023-01-28 NOTE — ED Triage Notes (Signed)
BIBA from home hx throat cancer has episodes of sudden vomiting which can cause him to have aspiration PNA 186/104 95% NRB

## 2023-01-28 NOTE — ED Provider Notes (Signed)
[facility] Provider Note   CSN: [id number] Arrival date & time: 01/28/23  2147     History {Add pertinent medical, surgical, social history, OB history to HPI:1} Chief Complaint  Patient presents with   Shortness of Breath    KARY FRONTINO is a 72 y.o. male.  72 y/o male with hx of oral squamous cell cancer s/p radiation, PEG tube, episodes of recurrent aspiration presents to the emergency department for complaints of shortness of breath which began tonight.  States that shortness of breath has been constant and unrelieved with his home nebulizer treatment.  He subjectively feels as though his throat is tight, but he has not had any difficulty swallowing or drooling.  Denies associated fevers, vomiting, chest pain. He is not on chronic O2.  Last admitted for aspiration PNA on 12/27/22.   Shortness of Breath      Home Medications Prior to Admission medications   Medication Sig Start Date End Date Taking? Authorizing Provider  albuterol (PROVENTIL) (2.5 MG/3ML) 0.083% nebulizer solution Inhale 3 mLs (2.5 mg total) by nebulization every 4 (four) hours as needed for wheezing. 01/16/23   Denita Lung, MD  cetirizine (ZYRTEC) 10 MG chewable tablet Place 10 mg into feeding tube daily as needed for allergies or rhinitis.    [provider]  diphenhydrAMINE (BENADRYL) 25 MG tablet Take 12.5 mg by mouth at bedtime as needed for allergies (to dry secretions).    [provider]  ferrous sulfate 300 (60 Fe) MG/5ML syrup Take 5 mLs (300 mg total) by mouth daily. 12/30/22 01/29/23  Regalado, Belkys A, MD  fluticasone (FLONASE) 50 MCG/ACT nasal spray SPRAY TWO SPRAYS IN EACH NOSTRIL ONCE DAILY AS NEEDED Patient taking differently: Place 2 sprays into both nostrils at bedtime. 06/05/22   Denita Lung, MD  guaiFENesin (ROBITUSSIN) 100 MG/5ML liquid Place 30 mLs into feeding tube 2 (two) times daily. 12/30/22   Regalado, Belkys A, MD   loperamide (IMODIUM) 2 MG capsule 2 mg See admin instructions. Place 2 mg into feeding tube daily as needed for diarrhea. Usually given during a feeding. Patient not taking: Reported on 01/16/2023    [provider]  midodrine (PROAMATINE) 2.5 MG tablet Take 2.5 mg by mouth at bedtime as needed (low BP). Patient not taking: Reported on 01/16/2023    [provider]  Nutritional Supplements (FEEDING SUPPLEMENT, OSMOLITE 1.5 CAL,) LIQD Place 1,425 mLs into feeding tube daily. 10/16/22   Mariel Aloe, MD  omeprazole (PRILOSEC) 40 MG capsule Removed the granules from the capsule and place him in the G-tube daily. 06/28/22   Denita Lung, MD  ondansetron (ZOFRAN) 4 MG tablet Take 1 tablet (4 mg total) by mouth every 6 (six) hours as needed for nausea or vomiting. Patient not taking: Reported on 01/16/2023 12/13/22   Georgette Shell, MD  Pedialyte (PEDIALYTE) SOLN Place 355 mLs into feeding tube daily. Patient not taking: Reported on 01/16/2023    [provider]  Saccharomyces boulardii (FLORASTOR PO) Take 250 mg by mouth daily.    [provider]  sodium chloride (OCEAN) 0.65 % SOLN nasal spray Place 1 spray into both nostrils as needed for congestion. 12/30/22   Regalado, Belkys A, MD  sodium chloride HYPERTONIC 3 % nebulizer solution Take 4 mLs by nebulization daily. 12/31/22   Regalado, Belkys A, MD  SYSTANE ULTRA PF 0.4-0.3 % SOLN Place 1 drop into both eyes daily as needed (for irritation).  Patient not taking: Reported on 01/16/2023    [provider]      Allergies    Codeine    Review of Systems   Review of Systems  Respiratory:  Positive for shortness of breath.   Ten systems reviewed and are negative for acute change, except as noted in the HPI.    Physical Exam Updated Vital Signs BP (!) 185/126   Pulse (!) 125   Temp 99.7 F (37.6 C) (Rectal)   Resp 20   Ht 6' (1.829 m)   Wt 79.4 kg   SpO2 94%   BMI 23.73 kg/m  Physical  Exam Vitals and nursing note reviewed.  Constitutional:      General: He is not in acute distress.    Appearance: He is well-developed. He is ill-appearing. He is not toxic-appearing or diaphoretic.     Comments: Chronically ill-appearing, nontoxic.  HENT:     Head: Normocephalic and atraumatic.  Eyes:     General: No scleral icterus.    Conjunctiva/sclera: Conjunctivae normal.  Cardiovascular:     Rate and Rhythm: Regular rhythm. Tachycardia present.     Pulses: Normal pulses.  Pulmonary:     Effort: Pulmonary effort is normal. No respiratory distress.     Breath sounds: Wheezing present.     Comments: Becomes winded with prolonged speech.  Patient with mild expiratory wheezing bilaterally.  Also scant rales appreciated in the left lung base.  Sats of 93% on nonrebreather.  Sporadic dry, nonproductive cough. Abdominal:     General: There is no distension.     Palpations: Abdomen is soft.  Musculoskeletal:        General: Normal range of motion.     Cervical back: Normal range of motion.  Skin:    General: Skin is warm and dry.     Coloration: Skin is not pale.     Findings: No erythema or rash.  Neurological:     Mental Status: He is alert and oriented to person, place, and time.     Coordination: Coordination normal.     Comments: Able to transition in bed independently.  Psychiatric:        Behavior: Behavior normal.     ED Results / Procedures / Treatments   Labs (all labs ordered are listed, but only abnormal results are displayed) Labs Reviewed  CULTURE, BLOOD (ROUTINE X 2)  CULTURE, BLOOD (ROUTINE X 2)  EXPECTORATED SPUTUM ASSESSMENT W GRAM STAIN, RFLX TO RESP C  RESP PANEL BY RT-PCR (RSV, FLU A&B, COVID)  RVPGX2  LACTIC ACID, PLASMA  LACTIC ACID, PLASMA  COMPREHENSIVE METABOLIC PANEL  CBC WITH DIFFERENTIAL/PLATELET  PROTIME-INR  URINALYSIS, ROUTINE W REFLEX MICROSCOPIC    EKG None  Radiology No results found.  Procedures Procedures  {Document  cardiac monitor, telemetry assessment procedure when appropriate:1}  Medications Ordered in ED Medications  albuterol (PROVENTIL) (2.5 MG/3ML) 0.083% nebulizer solution 5 mg (has no administration in time range)  ipratropium (ATROVENT) nebulizer solution 0.5 mg (has no administration in time range)    ED Course/ Medical Decision Making/ A&P   {   Click here for ABCD2, HEART and other calculatorsREFRESH Note before signing :1}                          Medical Decision Making Amount and/or Complexity of Data Reviewed Labs: ordered. Radiology: ordered.  Risk Prescription drug management.   ***  {Document critical care time when appropriate:1} {Document  review of labs and clinical decision tools ie heart score, Chads2Vasc2 etc:1}  {Document your independent review of radiology images, and any outside records:1} {Document your discussion with family members, caretakers, and with consultants:1} {Document social determinants of health affecting pt's care:1} {Document your decision making why or why not admission, treatments were needed:1} Final Clinical Impression(s) / ED Diagnoses Final diagnoses:  None    Rx / DC Orders ED Discharge Orders     None

## 2023-01-28 NOTE — Progress Notes (Signed)
A consult was received from an ED physician for Unasyn per pharmacy dosing.  The patient's profile has been reviewed for ht/wt/allergies/indication/available labs.   A one time order has been placed for Unasyn 3gm IV x1.  Further antibiotics/pharmacy consults should be ordered by admitting physician if indicated.                       Thank you, Netta Cedars PharmD 01/28/2023  11:36 PM

## 2023-01-29 DIAGNOSIS — Z79899 Other long term (current) drug therapy: Secondary | ICD-10-CM | POA: Diagnosis not present

## 2023-01-29 DIAGNOSIS — Z931 Gastrostomy status: Secondary | ICD-10-CM | POA: Diagnosis not present

## 2023-01-29 DIAGNOSIS — H9190 Unspecified hearing loss, unspecified ear: Secondary | ICD-10-CM | POA: Diagnosis present

## 2023-01-29 DIAGNOSIS — E876 Hypokalemia: Secondary | ICD-10-CM | POA: Diagnosis present

## 2023-01-29 DIAGNOSIS — R1312 Dysphagia, oropharyngeal phase: Secondary | ICD-10-CM | POA: Diagnosis present

## 2023-01-29 DIAGNOSIS — J690 Pneumonitis due to inhalation of food and vomit: Secondary | ICD-10-CM | POA: Diagnosis present

## 2023-01-29 DIAGNOSIS — Z9221 Personal history of antineoplastic chemotherapy: Secondary | ICD-10-CM | POA: Diagnosis not present

## 2023-01-29 DIAGNOSIS — J9601 Acute respiratory failure with hypoxia: Secondary | ICD-10-CM | POA: Diagnosis present

## 2023-01-29 DIAGNOSIS — E875 Hyperkalemia: Secondary | ICD-10-CM | POA: Diagnosis present

## 2023-01-29 DIAGNOSIS — J811 Chronic pulmonary edema: Secondary | ICD-10-CM | POA: Diagnosis not present

## 2023-01-29 DIAGNOSIS — K5730 Diverticulosis of large intestine without perforation or abscess without bleeding: Secondary | ICD-10-CM | POA: Diagnosis not present

## 2023-01-29 DIAGNOSIS — I10 Essential (primary) hypertension: Secondary | ICD-10-CM | POA: Diagnosis present

## 2023-01-29 DIAGNOSIS — Z1611 Resistance to penicillins: Secondary | ICD-10-CM | POA: Diagnosis present

## 2023-01-29 DIAGNOSIS — K219 Gastro-esophageal reflux disease without esophagitis: Secondary | ICD-10-CM | POA: Diagnosis present

## 2023-01-29 DIAGNOSIS — Z515 Encounter for palliative care: Secondary | ICD-10-CM | POA: Diagnosis not present

## 2023-01-29 DIAGNOSIS — B9620 Unspecified Escherichia coli [E. coli] as the cause of diseases classified elsewhere: Secondary | ICD-10-CM | POA: Diagnosis present

## 2023-01-29 DIAGNOSIS — Z923 Personal history of irradiation: Secondary | ICD-10-CM | POA: Diagnosis not present

## 2023-01-29 DIAGNOSIS — Z85810 Personal history of malignant neoplasm of tongue: Secondary | ICD-10-CM | POA: Diagnosis not present

## 2023-01-29 DIAGNOSIS — Z85828 Personal history of other malignant neoplasm of skin: Secondary | ICD-10-CM | POA: Diagnosis not present

## 2023-01-29 DIAGNOSIS — Z1152 Encounter for screening for COVID-19: Secondary | ICD-10-CM | POA: Diagnosis not present

## 2023-01-29 DIAGNOSIS — R112 Nausea with vomiting, unspecified: Secondary | ICD-10-CM | POA: Diagnosis present

## 2023-01-29 DIAGNOSIS — N281 Cyst of kidney, acquired: Secondary | ICD-10-CM | POA: Diagnosis not present

## 2023-01-29 DIAGNOSIS — Z808 Family history of malignant neoplasm of other organs or systems: Secondary | ICD-10-CM | POA: Diagnosis not present

## 2023-01-29 DIAGNOSIS — J9811 Atelectasis: Secondary | ICD-10-CM | POA: Diagnosis not present

## 2023-01-29 DIAGNOSIS — E871 Hypo-osmolality and hyponatremia: Secondary | ICD-10-CM | POA: Diagnosis present

## 2023-01-29 DIAGNOSIS — J189 Pneumonia, unspecified organism: Secondary | ICD-10-CM | POA: Diagnosis not present

## 2023-01-29 DIAGNOSIS — E43 Unspecified severe protein-calorie malnutrition: Secondary | ICD-10-CM | POA: Diagnosis present

## 2023-01-29 DIAGNOSIS — Z885 Allergy status to narcotic agent status: Secondary | ICD-10-CM | POA: Diagnosis not present

## 2023-01-29 LAB — GLUCOSE, CAPILLARY
Glucose-Capillary: 128 mg/dL — ABNORMAL HIGH (ref 70–99)
Glucose-Capillary: 162 mg/dL — ABNORMAL HIGH (ref 70–99)

## 2023-01-29 LAB — CBC WITH DIFFERENTIAL/PLATELET
Abs Immature Granulocytes: 0.01 10*3/uL (ref 0.00–0.07)
Basophils Absolute: 0 10*3/uL (ref 0.0–0.1)
Basophils Relative: 0 %
Eosinophils Absolute: 0 10*3/uL (ref 0.0–0.5)
Eosinophils Relative: 0 %
HCT: 38.1 % — ABNORMAL LOW (ref 39.0–52.0)
Hemoglobin: 12.3 g/dL — ABNORMAL LOW (ref 13.0–17.0)
Immature Granulocytes: 0 %
Lymphocytes Relative: 2 %
Lymphs Abs: 0.2 10*3/uL — ABNORMAL LOW (ref 0.7–4.0)
MCH: 28.5 pg (ref 26.0–34.0)
MCHC: 32.3 g/dL (ref 30.0–36.0)
MCV: 88.2 fL (ref 80.0–100.0)
Monocytes Absolute: 0.3 10*3/uL (ref 0.1–1.0)
Monocytes Relative: 4 %
Neutro Abs: 7.4 10*3/uL (ref 1.7–7.7)
Neutrophils Relative %: 94 %
Platelets: 248 10*3/uL (ref 150–400)
RBC: 4.32 MIL/uL (ref 4.22–5.81)
RDW: 16 % — ABNORMAL HIGH (ref 11.5–15.5)
WBC: 7.9 10*3/uL (ref 4.0–10.5)
nRBC: 0 % (ref 0.0–0.2)

## 2023-01-29 LAB — COMPREHENSIVE METABOLIC PANEL
ALT: 17 U/L (ref 0–44)
AST: 26 U/L (ref 15–41)
Albumin: 3.6 g/dL (ref 3.5–5.0)
Alkaline Phosphatase: 79 U/L (ref 38–126)
Anion gap: 10 (ref 5–15)
BUN: 31 mg/dL — ABNORMAL HIGH (ref 8–23)
CO2: 25 mmol/L (ref 22–32)
Calcium: 8.5 mg/dL — ABNORMAL LOW (ref 8.9–10.3)
Chloride: 96 mmol/L — ABNORMAL LOW (ref 98–111)
Creatinine, Ser: 1.02 mg/dL (ref 0.61–1.24)
GFR, Estimated: >60 mL/min (ref >60)
Glucose, Bld: 133 mg/dL — ABNORMAL HIGH (ref 70–99)
Potassium: 5.2 mmol/L — ABNORMAL HIGH (ref 3.5–5.1)
Sodium: 131 mmol/L — ABNORMAL LOW (ref 135–145)
Total Bilirubin: 0.6 mg/dL (ref 0.3–1.2)
Total Protein: 7.2 g/dL (ref 6.5–8.1)

## 2023-01-29 LAB — EXPECTORATED SPUTUM ASSESSMENT W GRAM STAIN, RFLX TO RESP C

## 2023-01-29 LAB — MRSA NEXT GEN BY PCR, NASAL: MRSA by PCR Next Gen: NOT DETECTED

## 2023-01-29 LAB — MAGNESIUM: Magnesium: 2.6 mg/dL — ABNORMAL HIGH (ref 1.7–2.4)

## 2023-01-29 LAB — PHOSPHORUS: Phosphorus: 3.1 mg/dL (ref 2.5–4.6)

## 2023-01-29 MED ORDER — LACTATED RINGERS IV SOLN: INTRAVENOUS | Status: DC

## 2023-01-29 MED ORDER — SALINE SPRAY 0.65 % NA SOLN
1.0000 | NASAL | Status: DC | PRN
  Filled 2023-01-29: qty 44

## 2023-01-29 MED ORDER — ONDANSETRON HCL 4 MG/2ML IJ SOLN
4.0000 mg | Freq: Four times a day (QID) | INTRAMUSCULAR | Status: DC | PRN
  Administered 2023-01-29 – 2023-01-31 (×5): 4 mg via INTRAVENOUS
  Filled 2023-01-29 (×5): qty 2

## 2023-01-29 MED ORDER — FLUTICASONE PROPIONATE 50 MCG/ACT NA SUSP
2.0000 | Freq: Every day | NASAL | Status: DC
  Administered 2023-01-29 – 2023-02-04 (×7): 2 via NASAL
  Filled 2023-01-29: qty 16

## 2023-01-29 MED ORDER — ACETAMINOPHEN 650 MG RE SUPP
650.0000 mg | Freq: Four times a day (QID) | RECTAL | Status: DC | PRN
  Administered 2023-01-31: 650 mg via RECTAL
  Filled 2023-01-29: qty 1

## 2023-01-29 MED ORDER — CHLORHEXIDINE GLUCONATE CLOTH 2 % EX PADS
6.0000 | MEDICATED_PAD | Freq: Every day | CUTANEOUS | Status: DC
  Administered 2023-01-29 – 2023-01-30 (×2): 6 via TOPICAL

## 2023-01-29 MED ORDER — HYDRALAZINE HCL 20 MG/ML IJ SOLN
5.0000 mg | Freq: Four times a day (QID) | INTRAMUSCULAR | Status: DC | PRN
  Administered 2023-01-31: 5 mg via INTRAVENOUS
  Filled 2023-01-29: qty 1

## 2023-01-29 MED ORDER — ORAL CARE MOUTH RINSE
15.0000 mL | OROMUCOSAL | Status: DC
  Administered 2023-01-29 – 2023-02-05 (×25): 15 mL via OROMUCOSAL

## 2023-01-29 MED ORDER — PANTOPRAZOLE SODIUM 40 MG PO TBEC: 40.0000 mg | DELAYED_RELEASE_TABLET | Freq: Every day | ORAL | Status: DC

## 2023-01-29 MED ORDER — FREE WATER
150.0000 mL | Freq: Every day | Status: DC
  Administered 2023-01-29 – 2023-01-31 (×9): 150 mL

## 2023-01-29 MED ORDER — PROSOURCE TF20 ENFIT COMPATIBL EN LIQD
60.0000 mL | Freq: Every day | ENTERAL | Status: DC
  Administered 2023-01-29 – 2023-02-04 (×5): 60 mL
  Filled 2023-01-29 (×7): qty 60

## 2023-01-29 MED ORDER — ENOXAPARIN SODIUM 40 MG/0.4ML IJ SOSY
40.0000 mg | PREFILLED_SYRINGE | INTRAMUSCULAR | Status: DC
  Administered 2023-01-29 – 2023-02-05 (×8): 40 mg via SUBCUTANEOUS
  Filled 2023-01-29 (×9): qty 0.4

## 2023-01-29 MED ORDER — LACTATED RINGERS IV BOLUS: 500.0000 mL | Freq: Once | INTRAVENOUS | Status: DC

## 2023-01-29 MED ORDER — PANTOPRAZOLE SODIUM 40 MG IV SOLR
40.0000 mg | Freq: Every day | INTRAVENOUS | Status: DC
  Administered 2023-01-29 – 2023-02-05 (×8): 40 mg via INTRAVENOUS
  Filled 2023-01-29 (×8): qty 10

## 2023-01-29 MED ORDER — ALBUTEROL SULFATE (2.5 MG/3ML) 0.083% IN NEBU
2.5000 mg | INHALATION_SOLUTION | Freq: Four times a day (QID) | RESPIRATORY_TRACT | Status: DC | PRN
  Administered 2023-01-30: 2.5 mg via RESPIRATORY_TRACT
  Filled 2023-01-29: qty 3

## 2023-01-29 MED ORDER — FERROUS SULFATE 300 (60 FE) MG/5ML PO SOLN
300.0000 mg | Freq: Every day | ORAL | Status: DC
  Administered 2023-01-29 – 2023-01-30 (×2): 300 mg via ORAL
  Filled 2023-01-29 (×3): qty 5

## 2023-01-29 MED ORDER — OSMOLITE 1.5 CAL PO LIQD
1425.0000 mL | ORAL | Status: DC
  Administered 2023-01-30 – 2023-01-31 (×2): 1425 mL
  Filled 2023-01-29 (×2): qty 2000

## 2023-01-29 MED ORDER — ORAL CARE MOUTH RINSE: 15.0000 mL | OROMUCOSAL | Status: DC | PRN

## 2023-01-29 MED ORDER — ACETAMINOPHEN 325 MG PO TABS: 650.0000 mg | ORAL_TABLET | Freq: Four times a day (QID) | ORAL | Status: DC | PRN

## 2023-01-29 MED ORDER — SODIUM CHLORIDE 0.9 % IV SOLN
3.0000 g | Freq: Four times a day (QID) | INTRAVENOUS | Status: DC
  Administered 2023-01-29 – 2023-01-31 (×11): 3 g via INTRAVENOUS
  Filled 2023-01-29 (×11): qty 8

## 2023-01-29 MED ORDER — OSMOLITE 1.5 CAL PO LIQD
475.0000 mL | Freq: Once | ORAL | Status: AC
  Administered 2023-01-29: 475 mL
  Filled 2023-01-29: qty 711

## 2023-01-29 NOTE — Progress Notes (Signed)
Initial Nutrition Assessment  DOCUMENTATION CODES:   Severe malnutrition in context of chronic illness  INTERVENTION:  - Per MD can start tube feeds this afternoon.   Initiate tube feeding via PEG: Osmolite 1.5 at 95 ml/h x15 hours (from 0600-2100) Prosource TF20 60 ml daily Provides 2217 kcal, 109 gm protein, 1086 ml free water daily  - Free water flushes 162m 5x daily (every 3 hours during the day) to provide total of 18389mday  - Monitor electrolytes and replete as needed.  - Monitor weight trends.    NUTRITION DIAGNOSIS:   Severe Malnutrition related to chronic illness (BCC and tongue base cancer) as evidenced by severe muscle depletion, severe fat depletion.  GOAL:   Patient will meet greater than or equal to 90% of their needs  MONITOR:   TF tolerance, Weight trends, Labs  REASON FOR ASSESSMENT:   Consult Assessment of nutrition requirement/status, Other (Comment) (pt has PEG tube)  ASSESSMENT:   [age].o. male with medical history significant for BCC and tongue base cancer who has a history of aspiration pneumonia last discharged from this hospital for the same about 1 month ago, his feeding tube dependent and admitted to the hospital with recurrent aspiration pneumonia.  Patient well known to this RD. Last seen during recent admission 1 year ago.   Recent UBW reported to be 166#. He endorses losing 5# during last admission but being able to gain it back recently. Per EMR weight without significant changes since August.   Patient does not eat by mouth, uses PEG for tube feeds. He reported home TF regimen as below: Osmolite 1.5 9570mr during the morning (~3 hours - starting at 6am) 110m47m the rest of the day (~9 hours - starting at around 9am) Total run time of 14 hours (6am-8pm) per patient report. He endorses this provides him with 6 cartons daily.  Per RD calculation this would provide ~6.3 cans per day.  He also gets 150mL24me water flushes 5 times a  day, approximately every 3 hours. This is a total of 750mL 92m water from flushes daily.  Total regimen provides 2242 kcals, 94g protein, and 1139mL f2mwater from formula + FWF = 1889mL fr39mater   Patient endorses tube feed regimen going well.  No concerns or questions.   Spoke with MD and can restart tube feeds today. Will keep patient at 95mL/hr 7mrun for 15 hours while admitted. He will need prosource TF20 to meet protien needs.  Medications reviewed and include: '300mg'$  iron  Labs reviewed:  Na 131 K+ 5.2 Magnesium 2.6    NUTRITION - FOCUSED PHYSICAL EXAM:  Flowsheet Row Most Recent Value  Orbital Region Moderate depletion  Upper Arm Region Severe depletion  Thoracic and Lumbar Region Severe depletion  Buccal Region Severe depletion  Temple Region Severe depletion  Clavicle Bone Region Severe depletion  Clavicle and Acromion Bone Region Severe depletion  Scapular Bone Region Unable to assess  Dorsal Hand Moderate depletion  Patellar Region Severe depletion  Anterior Thigh Region Severe depletion  Posterior Calf Region Severe depletion  Edema (RD Assessment) None  Hair Reviewed  Eyes Reviewed  Mouth Reviewed  Skin Reviewed  Nails Reviewed       Diet Order:   Diet Order             Diet NPO time specified  Diet effective now                   EDUCATION NEEDS:  Education  needs have been addressed  Skin:  Skin Assessment: Reviewed RN Assessment  Last BM:  PTA  Height:  Ht Readings from Last 1 Encounters:  01/29/23 6' (1.829 m)   Weight:  Wt Readings from Last 1 Encounters:  01/29/23 75.7 kg    BMI:  Body mass index is 22.63 kg/m.  Estimated Nutritional Needs:  Kcal:  2100-2400 kcals Protein:  100-125 grams Fluid:  >/= 2L    Samson Frederic RD, LDN For contact information, refer to [facility].

## 2023-01-29 NOTE — H&P (Signed)
History and Physical  Lee Barnes [id number] DOB: 1951-08-09 DOA: 01/28/2023   PCP: Denita Lung, MD   Patient coming from: Home   Chief Complaint: Cough, SOB   HPI: Lee Barnes is a 72 y.o. male with medical history significant for BCC and tongue base cancer who has a history of aspiration pneumonia last discharged from this hospital for the same about 1 month ago, his feeding tube dependent and admitted to the hospital with recurrent aspiration pneumonia.  The patient is very hard of hearing, his hearing aid is diet, but he is able to provide a history, was also able to speak with his wife over the phone this morning.  They tell me that after he went home from the hospital about a month ago, he did well for quite some time.  However, for the last 24 hours prior to hospital admission, he has had shortness of breath.  Initial oxygen saturations in the ER were about 80% on room air, chest x-ray suggestive of new aspiration pneumonia.  He was started on Unasyn and admitted to the hospitalist service.  He was placed on 12 L high flow oxygen, but otherwise he is comfortable.  Review of Systems: Please see HPI for pertinent positives and negatives. A complete 10 system review of systems are otherwise negative.  Past Medical History:  Diagnosis Date   Allergy to environmental factors    Basal cell carcinoma of left cheek 0000000   Complication of anesthesia    "vageled" after surgery -overnite stay   Diverticulosis 2008   Heart murmur    hx of in childhood    History of chemotherapy    History of radiation therapy 11/05/10-12/26/10   r base tongue, 7000 cGy 35 sessions   Hypertension    Medication started in May 2016.   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   Pneumonia    hx of 2012   Squamous cell carcinoma    right base of tongue   Tinnitus    Past Surgical History:  Procedure Laterality Date   APPENDECTOMY     COLONOSCOPY     INGUINAL HERNIA REPAIR Bilateral  03/30/2015   Procedure: LAPAROSCOPIC BILATERAL INGUINAL HERNIA REPAIR WITH MESH;  Surgeon: Coralie Keens, MD;  Location: [facility];  Service: General;  Laterality: Bilateral;   INGUINAL HERNIA REPAIR Right 11/18/1958   INGUINAL HERNIA REPAIR Bilateral 03/30/2015   INGUINAL HERNIA REPAIR Left 07/11/2015   INGUINAL HERNIA REPAIR N/A 07/11/2015   Procedure: REPAIR OF LEFT INGUINAL HERNIA WITH MESH;  Surgeon: Coralie Keens, MD;  Location: [place];  Service: General;  Laterality: N/A;   INSERTION OF MESH N/A 07/11/2015   Procedure: INSERTION OF MESH;  Surgeon: Coralie Keens, MD;  Location: [place];  Service: General;  Laterality: N/A;   IR [place] W/FLUORO  12/03/2021   LUMBAR [place] SURGERY     PEG PLACEMENT N/A 03/22/2022   Procedure: PERCUTANEOUS ENDOSCOPIC GASTROSTOMY (PEG) REPLACEMENT;  Surgeon: Carol Ada, MD;  Location: [facility];  Service: Gastroenterology;  Laterality: N/A;   SAVORY DILATION N/A 03/22/2022   Procedure: SAVORY DILATION;  Surgeon: Carol Ada, MD;  Location: [facility];  Service: Gastroenterology;  Laterality: N/A;   SKIN BIOPSY Left 06/24/2022   basal cell carcinoma face cheek   TONSILLECTOMY      Social History:  reports that he has never smoked. He has never used smokeless tobacco. He reports that he does not currently use alcohol. He reports that he does not use  drugs.   Allergies  Allergen Reactions   Codeine Nausea And Vomiting    Family History  Problem Relation Age of Onset   Heart disease Father    Cancer Father        throat ca   Heart disease Mother    Colon cancer Neg Hx      Prior to Admission medications   Medication Sig Start Date End Date Taking? Authorizing Provider  albuterol (PROVENTIL) (2.5 MG/3ML) 0.083% nebulizer solution Inhale 3 mLs (2.5 mg total) by nebulization every 4 (four) hours as needed for wheezing. 01/16/23   Denita Lung, MD  cetirizine (ZYRTEC) 10 MG chewable tablet Place 10 mg into  feeding tube daily as needed for allergies or rhinitis.    [provider]  diphenhydrAMINE (BENADRYL) 25 MG tablet Take 12.5 mg by mouth at bedtime as needed for allergies (to dry secretions).    [provider]  ferrous sulfate 300 (60 Fe) MG/5ML syrup Take 5 mLs (300 mg total) by mouth daily. 12/30/22 01/29/23  Regalado, Belkys A, MD  fluticasone (FLONASE) 50 MCG/ACT nasal spray SPRAY TWO SPRAYS IN EACH NOSTRIL ONCE DAILY AS NEEDED Patient taking differently: Place 2 sprays into both nostrils at bedtime. 06/05/22   Denita Lung, MD  guaiFENesin (ROBITUSSIN) 100 MG/5ML liquid Place 30 mLs into feeding tube 2 (two) times daily. 12/30/22   Regalado, Belkys A, MD  loperamide (IMODIUM) 2 MG capsule 2 mg See admin instructions. Place 2 mg into feeding tube daily as needed for diarrhea. Usually given during a feeding. Patient not taking: Reported on 01/16/2023    [provider]  midodrine (PROAMATINE) 2.5 MG tablet Take 2.5 mg by mouth at bedtime as needed (low BP). Patient not taking: Reported on 01/16/2023    [provider]  Nutritional Supplements (FEEDING SUPPLEMENT, OSMOLITE 1.5 CAL,) LIQD Place 1,425 mLs into feeding tube daily. 10/16/22   Mariel Aloe, MD  omeprazole (PRILOSEC) 40 MG capsule Removed the granules from the capsule and place him in the G-tube daily. 06/28/22   Denita Lung, MD  ondansetron (ZOFRAN) 4 MG tablet Take 1 tablet (4 mg total) by mouth every 6 (six) hours as needed for nausea or vomiting. Patient not taking: Reported on 01/16/2023 12/13/22   Georgette Shell, MD  Pedialyte (PEDIALYTE) SOLN Place 355 mLs into feeding tube daily. Patient not taking: Reported on 01/16/2023    [provider]  Saccharomyces boulardii (FLORASTOR PO) Take 250 mg by mouth daily.    [provider]  sodium chloride (OCEAN) 0.65 % SOLN nasal spray Place 1 spray into both nostrils as needed for congestion. 12/30/22   Regalado, Belkys A, MD   sodium chloride HYPERTONIC 3 % nebulizer solution Take 4 mLs by nebulization daily. 12/31/22   Regalado, Belkys A, MD  SYSTANE ULTRA PF 0.4-0.3 % SOLN Place 1 drop into both eyes daily as needed (for irritation). Patient not taking: Reported on 01/16/2023    [provider]    Physical Exam: BP 111/62   Pulse 96   Temp 98 F (36.7 C) (Oral)   Resp (!) 29   Ht 6' (1.829 m)   Wt 75.7 kg   SpO2 94%   BMI 22.63 kg/m   General:  Alert, oriented, calm, in no acute distress, thin gentleman but looks comfortable, on 12 L nonrebreather Eyes: EOMI, clear conjuctivae, white sclerea Neck: supple, no masses, trachea mildline  Cardiovascular: RRR, no murmurs or rubs, no peripheral edema  Respiratory: clear to auscultation bilaterally, intermittent wet sounding cough, breath sounds are distant a bdomen: soft, nontender, nondistended, normal bowel tones heard  Skin: dry, no rashes  Musculoskeletal: no joint effusions, normal range of motion  Psychiatric: appropriate affect, normal speech  Neurologic: extraocular muscles intact, clear speech, moving all extremities with intact sensorium          Labs on Admission:  Basic Metabolic Panel: Recent Labs  Lab 01/28/23 2225 01/29/23 0310  NA 128* 131*  K 4.6 5.2*  CL 94* 96*  CO2 27 25  GLUCOSE 114* 133*  BUN 30* 31*  CREATININE 0.86 1.02  CALCIUM 8.9 8.5*  MG  --  2.6*  PHOS  --  3.1   Liver Function Tests: Recent Labs  Lab 01/28/23 2225 01/29/23 0310  AST 29 26  ALT 20 17  ALKPHOS 108 79  BILITOT 0.6 0.6  PROT 8.8* 7.2  ALBUMIN 4.3 3.6   No results for input(s): "LIPASE", "AMYLASE" in the last 168 hours. No results for input(s): "AMMONIA" in the last 168 hours. CBC: Recent Labs  Lab 01/28/23 2225 01/29/23 0310  WBC 16.9* 7.9  NEUTROABS 15.8* 7.4  HGB 13.5 12.3*  HCT 43.4 38.1*  MCV 89.7 88.2  PLT 256 248   Cardiac Enzymes: No results for input(s): "CKTOTAL", "CKMB", "CKMBINDEX", "TROPONINI" in the last 168  hours.  BNP (last 3 results) Recent Labs    10/06/22 0620  BNP 28.2    ProBNP (last 3 results) No results for input(s): "PROBNP" in the last 8760 hours.  CBG: No results for input(s): "GLUCAP" in the last 168 hours.  Radiological Exams on Admission: DG Chest 2 View  Result Date: 01/28/2023 CLINICAL DATA:  Shortness of breath. Throat cancer and episodes of vomiting which can cause aspiration pneumonia EXAM: CHEST - 2 VIEW COMPARISON:  12/27/2022 FINDINGS: Elevated right hemidiaphragm and similar right basilar opacities. New hazy opacities in the left mid and lower lung. No pleural effusion or pneumothorax. Stable cardiomediastinal silhouette. IMPRESSION: New left basilar opacities and similar right basilar opacities. Differential considerations include atelectasis, aspiration, or pneumonia. Electronically Signed   By: Placido Sou M.D.   On: 01/28/2023 23:05    Assessment/Plan Principal Problem:   Aspiration pneumonia ([place]) -patient presents with cough, shortness of breath, hypoxic respiratory failure.  Found on chest x-ray to have consolidation.  Being treated presumptively for recurrent aspiration pneumonia.  Noted leukocytosis at the time of admission. -inpatient admission -Supplemental oxygen to keep O2 saturation above 92%, remains on high flow wean as able -Empiric IV Unasyn -Note negative MRSA screen -Respiratory culture is pending Active Problems:   Dysphagia -this is a chronic problem for this patient, he is feeding tube dependent.  Continue feeds per G-tube, SLP consulted   History of oropharyngeal cancer   Essential hypertension   GERD (gastroesophageal reflux disease)   On tube feeding diet   Hyperkalemia -mild, unclear etiology, will hydrate and recheck in the morning  DVT prophylaxis: Lovenox and SCDs  Code Status: Extensive discussion with the patient's wife, patient unable to participate as he cannot hear the phone.  Decision was made for now to keep the  patient full code.  He is being followed by palliative care as an outpatient.  Family Communication: Gust over the phone this morning with his wife  Admission status: Inpatient   Time spent: 61 minutes  Carlous Olivares Neva Seat MD [facility] Pager [phone]  If 7PM-7AM, please contact night-coverage [email or web address] Password [facility]  01/29/2023, 8:54 AM

## 2023-01-29 NOTE — Progress Notes (Signed)
Patient requested to turn tube feed off so he could go to sleep. 376 mL's infused total.

## 2023-01-29 NOTE — Evaluation (Signed)
SLP Cancellation Note  Patient Details Name: DEMONE Barnes MRN: [id number] DOB: 1951-05-08   Cancelled treatment:       Reason Eval/Treat Not Completed: Other (comment);Medical issues which prohibited therapy (pt currently on high level of oxygen and has tube feeding established; will follow up next date; clarified order with MD)  Md reports pt's wife has questions about coughing and pt's vomiting.    Lee Lime, MS [facility] SLP Acute Rehab Services Office [phone]   Lee Barnes 01/29/2023, 6:13 PM

## 2023-01-29 NOTE — Progress Notes (Signed)
Pharmacy Antibiotic Note  DAJOUR BUFFONE is a 72 y.o. male admitted on 01/28/2023 with aspiration pneumonia.  Pharmacy has been consulted for Unasyn dosing.  Plan: Unasyn 3gm IV q6h No dose adjustments anticipated.  Pharmacy will sign off and monitor peripherally via electronic surveillance software for any changes in renal function or micro data.   Height: 6' (182.9 cm) Weight: 79.4 kg (175 lb) IBW/kg (Calculated) : 77.6  Temp (24hrs), Avg:99.7 F (37.6 C), Min:99.7 F (37.6 C), Max:99.7 F (37.6 C)  Recent Labs  Lab 01/28/23 2225  WBC 16.9*  CREATININE 0.86  LATICACIDVEN 1.9    Estimated Creatinine Clearance: 86.5 mL/min (by C-G formula based on SCr of 0.86 mg/dL).    Allergies  Allergen Reactions   Codeine Nausea And Vomiting   Thank you for allowing pharmacy to be a part of this patient's care.  Netta Cedars PharmD 01/29/2023 1:30 AM

## 2023-01-29 NOTE — Progress Notes (Signed)
  Carryover admission to the Day Admitter.  I discussed this case with the EDP, Antonietta Breach  Per these discussions:   This is a 72 year old male with history of throat cancer and recurrent hospitalization for aspiration pneumonia, who is being admitted with aspiration pneumonia complicated by acute hypoxic respiratory failure presenting with 1 day of shortness of breath, with today's chest x-ray suggestive of new aspiration pneumonia.  Initial oxygen saturations in the mid 80s on room air, [place] improving into the 90s on nonrebreather.  CBC notable for new leukocytosis.  He was started on Unasyn for anaerobic coverage in the setting of suspected presenting aspiration pneumonia.  I have placed an order for inpatient admission to [facility] for further evaluation management of the above  I have placed some additional preliminary admit orders via the adult multi-morbid admission order set. I have also ordered additional Unasyn, n.p.o.  Most recent blood pressure was on the soft side so I ordered a 500 cc LR bolus followed by continuous LR running at 125 cc/h.    Babs Bertin, DO Hospitalist

## 2023-01-30 DIAGNOSIS — E871 Hypo-osmolality and hyponatremia: Secondary | ICD-10-CM

## 2023-01-30 DIAGNOSIS — J9601 Acute respiratory failure with hypoxia: Secondary | ICD-10-CM | POA: Diagnosis not present

## 2023-01-30 DIAGNOSIS — J690 Pneumonitis due to inhalation of food and vomit: Secondary | ICD-10-CM | POA: Diagnosis not present

## 2023-01-30 LAB — MAGNESIUM
Magnesium: 2.2 mg/dL (ref 1.7–2.4)
Magnesium: 2.2 mg/dL (ref 1.7–2.4)

## 2023-01-30 LAB — GLUCOSE, CAPILLARY
Glucose-Capillary: 116 mg/dL — ABNORMAL HIGH (ref 70–99)
Glucose-Capillary: 146 mg/dL — ABNORMAL HIGH (ref 70–99)
Glucose-Capillary: 126 mg/dL — ABNORMAL HIGH (ref 70–99)
Glucose-Capillary: 152 mg/dL — ABNORMAL HIGH (ref 70–99)
Glucose-Capillary: 151 mg/dL — ABNORMAL HIGH (ref 70–99)

## 2023-01-30 LAB — BASIC METABOLIC PANEL WITH GFR
Anion gap: 10 (ref 5–15)
BUN: 34 mg/dL — ABNORMAL HIGH (ref 8–23)
CO2: 28 mmol/L (ref 22–32)
Calcium: 9 mg/dL (ref 8.9–10.3)
Chloride: 95 mmol/L — ABNORMAL LOW (ref 98–111)
Creatinine, Ser: 0.96 mg/dL (ref 0.61–1.24)
GFR, Estimated: >60 mL/min
Glucose, Bld: 106 mg/dL — ABNORMAL HIGH (ref 70–99)
Potassium: 4.8 mmol/L (ref 3.5–5.1)
Sodium: 133 mmol/L — ABNORMAL LOW (ref 135–145)

## 2023-01-30 LAB — CBC
HCT: 33.9 % — ABNORMAL LOW (ref 39.0–52.0)
Hemoglobin: 10.7 g/dL — ABNORMAL LOW (ref 13.0–17.0)
MCH: 28.8 pg (ref 26.0–34.0)
MCHC: 31.6 g/dL (ref 30.0–36.0)
MCV: 91.1 fL (ref 80.0–100.0)
Platelets: 214 10*3/uL (ref 150–400)
RBC: 3.72 MIL/uL — ABNORMAL LOW (ref 4.22–5.81)
RDW: 16.4 % — ABNORMAL HIGH (ref 11.5–15.5)
WBC: 15.1 10*3/uL — ABNORMAL HIGH (ref 4.0–10.5)
nRBC: 0 % (ref 0.0–0.2)

## 2023-01-30 LAB — PHOSPHORUS
Phosphorus: 2 mg/dL — ABNORMAL LOW (ref 2.5–4.6)
Phosphorus: 3.4 mg/dL (ref 2.5–4.6)

## 2023-01-30 MED ORDER — K PHOS MONO-SOD PHOS DI & MONO 155-852-130 MG PO TABS
500.0000 mg | ORAL_TABLET | Freq: Three times a day (TID) | ORAL | Status: AC
  Administered 2023-01-30 (×3): 500 mg
  Filled 2023-01-30 (×4): qty 2

## 2023-01-30 MED ORDER — LOPERAMIDE HCL 1 MG/7.5ML PO SUSP
2.0000 mg | ORAL | Status: DC | PRN
  Administered 2023-01-30 – 2023-02-01 (×2): 2 mg
  Filled 2023-01-30 (×3): qty 15

## 2023-01-30 MED ORDER — ALBUTEROL SULFATE (2.5 MG/3ML) 0.083% IN NEBU
2.5000 mg | INHALATION_SOLUTION | Freq: Two times a day (BID) | RESPIRATORY_TRACT | Status: DC
  Administered 2023-01-30 – 2023-02-05 (×12): 2.5 mg via RESPIRATORY_TRACT
  Filled 2023-01-30 (×12): qty 3

## 2023-01-30 NOTE — Progress Notes (Signed)
[facility] PROGRESS NOTE   Lee Barnes [id number] DOB: September 26, 1951 DOA: 01/28/2023  PCP: Denita Lung, MD  Brief History/Interval Summary: 72 y.o. male with medical history significant for BCC and tongue base cancer who has a history of aspiration pneumonia last discharged from this hospital for the same about 1 month ago, he is feeding tube dependent and admitted to the hospital with recurrent aspiration pneumonia.  He was placed on high flow nasal cannula at 10 L/min.  Consultants: None  Procedures: None    Subjective/Interval History: Patient mentions that he is feeling better this morning.  Not as short of breath as yesterday.  Occasional cough.  No chest pain.  No nausea or vomiting.    Assessment/Plan:  Aspiration pneumonia/acute respiratory failure with hypoxia This is secondary to his history of throat cancer. Patient currently on Unasyn. Currently on 10 L of oxygen by high flow nasal cannula.  Saturations are in the late 90s.  Oxygen to be weaned down.  Discussed with nursing staff. Follow-up on sputum cultures. Continue n.p.o. status. Speech therapy to see.  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  He has had his G-tube for the last year and a half.  Continue tube feedings.  History of oropharyngeal cancer Stable otherwise.  Hyperkalemia Resolved this morning.  Hyponatremia and hypophosphatemia Will prescribe phosphorus supplementation.  Sodium level seems to be better compared to yesterday.  Normocytic anemia Drop in hemoglobin is dilutional.  No evidence of overt blood loss.  Continue to monitor.  Severe protein calorie malnutrition Nutrition Problem: Severe Malnutrition Etiology: chronic illness (BCC and tongue base cancer)  DVT Prophylaxis: Lovenox Code Status: Full code Family Communication: Discussed with the patient Disposition Plan: Hopefully return home when improved  Status is: Inpatient Remains inpatient  appropriate because: Aspiration pneumonia.  Acute respiratory failure with hypoxia.    Medications: Scheduled:  Chlorhexidine Gluconate Cloth  6 each Topical Daily   enoxaparin (LOVENOX) injection  40 mg Subcutaneous Q24H   feeding supplement (OSMOLITE 1.5 CAL)  1,425 mL Per Tube Q24H   feeding supplement (PROSource TF20)  60 mL Per Tube Daily   ferrous sulfate  300 mg Oral Daily   fluticasone  2 spray Each Nare Daily   free water  150 mL Per Tube 5 X Daily   mouth rinse  15 mL Mouth Rinse 4 times per day   pantoprazole (PROTONIX) IV  40 mg Intravenous Daily   phosphorus  500 mg Per Tube TID   Continuous:  ampicillin-sulbactam (UNASYN) IV Stopped (01/30/23 0622)   [id number] **OR** acetaminophen, albuterol, hydrALAZINE, ondansetron (ZOFRAN) IV, mouth rinse, sodium chloride  Antibiotics: Anti-infectives (From admission, onward)    Start     Dose/Rate Route Frequency Ordered Stop   01/29/23 0600  Ampicillin-Sulbactam (UNASYN) 3 g in sodium chloride 0.9 % 100 mL IVPB        3 g 200 mL/hr over 30 Minutes Intravenous Every 6 hours 01/29/23 0130     01/28/23 2345  Ampicillin-Sulbactam (UNASYN) 3 g in sodium chloride 0.9 % 100 mL IVPB        3 g 200 mL/hr over 30 Minutes Intravenous  Once 01/28/23 2335 01/29/23 0111       Objective:  Vital Signs  Vitals:   01/30/23 0500 01/30/23 0600 01/30/23 0700 01/30/23 0800  BP: 137/80 (!) 167/106 (!) 113/53 (!) 148/75  Pulse: 69 97 73 89  Resp: 15 (!) '22 20 14  '$ Temp:    98 F (36.7 C)  TempSrc:    Oral  SpO2: 98% 96% 99% 97%  Weight: 75.6 kg     Height:        Intake/Output Summary (Last 24 hours) at 01/30/2023 0935 Last data filed at 01/30/2023 0800 Gross per 24 hour  Intake 1907.54 ml  Output 1300 ml  Net 607.54 ml   Filed Weights   01/28/23 2200 01/29/23 0400 01/30/23 0500  Weight: 79.4 kg 75.7 kg 75.6 kg    General appearance: Awake alert.  In no distress Resp: Tachypneic.  No use of accessory muscles noted.   Coarse breath sounds bilaterally with crackles at the bases.  No wheezing or rhonchi. Cardio: S1-S2 is normal regular.  No S3-S4.  No rubs murmurs or bruit GI: Abdomen is soft.  Nontender nondistended.  Bowel sounds are present normal.  No masses organomegaly.  PEG tube is noted. Extremities: No edema.  Full range of motion of lower extremities. Neurologic: Alert and oriented x3.  No focal neurological deficits.    Lab Results:  Data Reviewed: I have personally reviewed following labs and reports of the imaging studies  CBC: Recent Labs  Lab 01/28/23 2225 01/29/23 0310 01/30/23 0514  WBC 16.9* 7.9 15.1*  NEUTROABS 15.8* 7.4  --   HGB 13.5 12.3* 10.7*  HCT 43.4 38.1* 33.9*  MCV 89.7 88.2 91.1  PLT 256 248 [id number]    Basic Metabolic Panel: Recent Labs  Lab 01/28/23 2225 01/29/23 0310 01/30/23 0514  NA 128* 131* 133*  K 4.6 5.2* 4.8  CL 94* 96* 95*  CO2 '27 25 28  '$ GLUCOSE 114* 133* 106*  BUN 30* 31* 34*  CREATININE 0.86 1.02 0.96  CALCIUM 8.9 8.5* 9.0  MG  --  2.6* 2.2  PHOS  --  3.1 2.0*    GFR: Estimated Creatinine Clearance: 75.5 mL/min (by C-G formula based on SCr of 0.96 mg/dL).  Liver Function Tests: Recent Labs  Lab 01/28/23 2225 01/29/23 0310  AST 29 26  ALT 20 17  ALKPHOS 108 79  BILITOT 0.6 0.6  PROT 8.8* 7.2  ALBUMIN 4.3 3.6     Coagulation Profile: Recent Labs  Lab 01/28/23 2225  INR 1.1     CBG: Recent Labs  Lab 01/29/23 2009 01/29/23 2344 01/30/23 0743  GLUCAP 162* 128* 146*     Recent Results (from the past 240 hour(s))  Blood Culture (routine x 2)     Status: None (Preliminary result)   Collection Time: 01/28/23 10:25 PM   Specimen: BLOOD RIGHT FOREARM  Result Value Ref Range Status   Specimen Description   Final    BLOOD RIGHT FOREARM Performed at [facility] [address], [place], [place] [address]    Special Requests   Final    BOTTLES DRAWN AEROBIC AND ANAEROBIC Blood Culture adequate  volume Performed at [facility] [address] [place], [place] [address]    Culture   Final    NO GROWTH < 12 HOURS Performed at [facility] [address], [place], [place] [address]    Report Status PENDING  Incomplete  Resp panel by RT-PCR (RSV, Flu A&B, Covid)     Status: None   Collection Time: 01/28/23 10:29 PM   Specimen: Nasal Swab  Result Value Ref Range Status   SARS Coronavirus 2 by RT PCR NEGATIVE NEGATIVE Final    Comment: (NOTE) SARS-CoV-2 target nucleic acids are NOT DETECTED.  The SARS-CoV-2 RNA is generally detectable in upper respiratory specimens  during the acute phase of infection. The lowest concentration of SARS-CoV-2 viral copies this assay can detect is 138 copies/mL. A negative result does not preclude SARS-Cov-2 infection and should not be used as the sole basis for treatment or other patient management decisions. A negative result may occur with  improper specimen collection/handling, submission of specimen other than nasopharyngeal swab, presence of viral mutation(s) within the areas targeted by this assay, and inadequate number of viral copies(<138 copies/mL). A negative result must be combined with clinical observations, patient history, and epidemiological information. The expected result is Negative.  Fact Sheet for Patients:  EntrepreneurPulse.com.au  Fact Sheet for Healthcare Providers:  IncredibleEmployment.be  This test is no t yet approved or cleared by the [place] FDA and  has been authorized for detection and/or diagnosis of SARS-CoV-2 by FDA under an Emergency Use Authorization (EUA). This EUA will remain  in effect (meaning this test can be used) for the duration of the COVID-19 declaration under Section 564(b)(1) of the Act, 21 U.S.C.section 360bbb-3(b)(1), unless the authorization is terminated  or revoked sooner.       Influenza A by PCR NEGATIVE NEGATIVE  Final   Influenza B by PCR NEGATIVE NEGATIVE Final    Comment: (NOTE) The Xpert Xpress SARS-CoV-2/FLU/RSV plus assay is intended as an aid in the diagnosis of influenza from Nasopharyngeal swab specimens and should not be used as a sole basis for treatment. Nasal washings and aspirates are unacceptable for Xpert Xpress SARS-CoV-2/FLU/RSV testing.  Fact Sheet for Patients: EntrepreneurPulse.com.au  Fact Sheet for Healthcare Providers: IncredibleEmployment.be  This test is not yet approved or cleared by the [place] FDA and has been authorized for detection and/or diagnosis of SARS-CoV-2 by FDA under an Emergency Use Authorization (EUA). This EUA will remain in effect (meaning this test can be used) for the duration of the COVID-19 declaration under Section 564(b)(1) of the Act, 21 U.S.C. section 360bbb-3(b)(1), unless the authorization is terminated or revoked.     Resp Syncytial Virus by PCR NEGATIVE NEGATIVE Final    Comment: (NOTE) Fact Sheet for Patients: EntrepreneurPulse.com.au  Fact Sheet for Healthcare Providers: IncredibleEmployment.be  This test is not yet approved or cleared by the [place] FDA and has been authorized for detection and/or diagnosis of SARS-CoV-2 by FDA under an Emergency Use Authorization (EUA). This EUA will remain in effect (meaning this test can be used) for the duration of the COVID-19 declaration under Section 564(b)(1) of the Act, 21 U.S.C. section 360bbb-3(b)(1), unless the authorization is terminated or revoked.  Performed at [facility], [place] [address], [place], [place] [address]   Blood Culture (routine x 2)     Status: None (Preliminary result)   Collection Time: 01/28/23 10:45 PM   Specimen: BLOOD  Result Value Ref Range Status   Specimen Description   Final    BLOOD BLOOD RIGHT WRIST Performed at [facility] [address], [place], [place] [address]    Special Requests   Final    BOTTLES DRAWN AEROBIC AND ANAEROBIC Blood Culture results may not be optimal due to an inadequate volume of blood received in culture bottles Performed at [facility] [address], [place], [place] [address]    Culture   Final    NO GROWTH < 12 HOURS Performed at [facility] [address] [place], [place] [address]    Report Status PENDING  Incomplete  Expectorated Sputum Assessment w Gram Stain, Rflx to Resp  Cult     Status: None   Collection Time: 01/29/23  4:20 AM   Specimen: Expectorated Sputum  Result Value Ref Range Status   Specimen Description EXPECTORATED SPUTUM  Final   Special Requests NONE  Final   Sputum evaluation   Final    THIS SPECIMEN IS ACCEPTABLE FOR SPUTUM CULTURE Performed at [facility], [place] [address], [place], [place] [address]    Report Status 01/29/2023 FINAL  Final  Culture, Respiratory w Gram Stain     Status: None (Preliminary result)   Collection Time: 01/29/23  4:20 AM  Result Value Ref Range Status   Specimen Description   Final    EXPECTORATED SPUTUM Performed at [facility] [address], [place], [place] [address]    Special Requests   Final    NONE Reflexed from [id number] Performed at [facility] [address], [place], [place] [address]    Gram Stain   Final    NO WBC SEEN ABUNDANT GRAM NEGATIVE RODS FEW BUDDING YEAST SEEN RARE GRAM POSITIVE COCCI IN CLUSTERS Performed at [facility], [place] [address], [place], [place] [address]    Culture PENDING  Incomplete   Report Status PENDING  Incomplete  MRSA Next Gen by PCR, Nasal     Status: None   Collection Time: 01/29/23  4:54 AM   Specimen: Nasal Mucosa; Nasal Swab  Result Value Ref Range Status   MRSA by PCR Next Gen NOT DETECTED NOT DETECTED Final    Comment: (NOTE) The GeneXpert MRSA Assay (FDA approved for  NASAL specimens only), is one component of a comprehensive MRSA colonization surveillance program. It is not intended to diagnose MRSA infection nor to guide or monitor treatment for MRSA infections. Test performance is not FDA approved in patients less than 48 years old. Performed at [facility], [place] [address], [place],  [address]       Radiology Studies: DG Chest 2 View  Result Date: 01/28/2023 CLINICAL DATA:  Shortness of breath. Throat cancer and episodes of vomiting which can cause aspiration pneumonia EXAM: CHEST - 2 VIEW COMPARISON:  12/27/2022 FINDINGS: Elevated right hemidiaphragm and similar right basilar opacities. New hazy opacities in the left mid and lower lung. No pleural effusion or pneumothorax. Stable cardiomediastinal silhouette. IMPRESSION: New left basilar opacities and similar right basilar opacities. Differential considerations include atelectasis, aspiration, or pneumonia. Electronically Signed   By: Placido Sou M.D.   On: 01/28/2023 23:05       LOS: 1 day   [facility] Pager on [email or web address]  01/30/2023, 9:35 AM

## 2023-01-30 NOTE — Plan of Care (Signed)

## 2023-01-30 NOTE — Evaluation (Signed)
Physical Therapy Evaluation Patient Details Name: Lee Barnes MRN: [id number] DOB: 20-Dec-1950 Today's Date: 01/30/2023  History of Present Illness  Pt is a 72 y/o male presenting to [facility]/28/23 with recurrent pneumonia.PMH: aspiration PNA. , squamous cell carcinoma of L cheek, diverticulosis, childhood heart murmur, HTN, oropharyngeal cancer, tinnitis, hx of chemo and radiation, PEG tube placement,  Clinical Impression  Pt admitted with above diagnosis.   Pt currently with functional limitations due to the deficits listed below (see PT Problem List). Pt will benefit from skilled PT to increase their independence and safety with mobility to allow discharge to the venue listed below.       The patient ambulated x 400' on 6 [facility]. SPO2 remained >91%. Patient requires no support during ambulation.   Recommendations for follow up therapy are one component of a multi-disciplinary discharge planning process, led by the attending physician.  Recommendations may be updated based on patient status, additional functional criteria and insurance authorization.  Follow Up Recommendations No PT follow up      Assistance Recommended at Discharge PRN  Patient can return home with the following  Assistance with cooking/housework    Equipment Recommendations None recommended by PT  Recommendations for Other Services       Functional Status Assessment Patient has had a recent decline in their functional status and demonstrates the ability to make significant improvements in function in a reasonable and predictable amount of time.     Precautions / Restrictions Precautions Precaution Comments: monitor  SPO2 , PEG      Mobility  Bed Mobility                    Transfers   Equipment used: None                    Ambulation/Gait Ambulation/Gait assistance: Supervision Gait Distance (Feet): 440 Feet Assistive device: None Gait Pattern/deviations: WFL(Within Functional  Limits)   Gait velocity interpretation: 1.31 - 2.62 ft/sec, indicative of limited community Social research officer, government Rankin (Stroke Patients Only)       Balance Overall balance assessment: No apparent balance deficits (not formally assessed)                                           Pertinent Vitals/Pain Pain Assessment Pain Assessment: No/denies pain    Home Living Family/patient expects to be discharged to:: Private residence Living Arrangements: Spouse/significant other Available Help at Discharge: Family;Available 24 hours/day Type of Home: House Home Access: Stairs to enter Entrance Stairs-Rails: Right Entrance Stairs-Number of Steps: 4   Home Layout: Two level;Able to live on main level with bedroom/bathroom Home Equipment: Shower seat - built in Additional Comments: pt lives with wife and is independent    Prior Function Prior Level of Function : Independent/Modified Independent                     Hand Dominance   Dominant Hand: Right    Extremity/Trunk Assessment   Upper Extremity Assessment Upper Extremity Assessment: Overall WFL for tasks assessed    Lower Extremity Assessment Lower Extremity Assessment: Overall WFL for tasks assessed    Cervical / Trunk Assessment Cervical / Trunk Assessment: Normal  Communication   Communication:  HOH  Cognition Arousal/Alertness: Awake/alert Behavior During Therapy: WFL for tasks assessed/performed Overall Cognitive Status: Within Functional Limits for tasks assessed                                          General Comments      Exercises     Assessment/Plan    PT Assessment Patient needs continued PT services  PT Problem List Decreased activity tolerance       PT Treatment Interventions Gait training;Functional mobility training;Patient/family education    PT Goals (Current goals can be found in the Care  Plan section)  Acute Rehab PT Goals Patient Stated Goal: walk PT Goal Formulation: With patient Time For Goal Achievement: 02/13/23 Potential to Achieve Goals: Good    Frequency Min 3X/week     Co-evaluation               AM-PAC PT "6 Clicks" Mobility  Outcome Measure Help needed turning from your back to your side while in a flat bed without using bedrails?: None Help needed moving from lying on your back to sitting on the side of a flat bed without using bedrails?: None Help needed moving to and from a bed to a chair (including a wheelchair)?: None Help needed standing up from a chair using your arms (e.g., wheelchair or bedside chair)?: None Help needed to walk in hospital room?: A Little Help needed climbing 3-5 steps with a railing? : A Little 6 Click Score: 22    End of Session Equipment Utilized During Treatment: Oxygen Activity Tolerance: Patient tolerated treatment well Patient left: in chair;with call bell/phone within reach;with chair alarm set Nurse Communication: Mobility status PT Visit Diagnosis: Difficulty in walking, not elsewhere classified (R26.2)    Time: [id number] PT Time Calculation (min) (ACUTE ONLY): 19 min   Charges:   PT Evaluation $PT Eval Low Complexity: 1 Low          [place] Office [phone] Weekend pager-[phone]   Claretha Cooper 01/30/2023, 12:20 PM

## 2023-01-31 ENCOUNTER — Inpatient Hospital Stay (HOSPITAL_COMMUNITY): Payer: Medicare Other

## 2023-01-31 DIAGNOSIS — J9601 Acute respiratory failure with hypoxia: Secondary | ICD-10-CM | POA: Diagnosis not present

## 2023-01-31 DIAGNOSIS — J690 Pneumonitis due to inhalation of food and vomit: Secondary | ICD-10-CM | POA: Diagnosis not present

## 2023-01-31 LAB — MAGNESIUM
Magnesium: 1.8 mg/dL (ref 1.7–2.4)
Magnesium: 1.9 mg/dL (ref 1.7–2.4)

## 2023-01-31 LAB — BASIC METABOLIC PANEL
Anion gap: 11 (ref 5–15)
BUN: 30 mg/dL — ABNORMAL HIGH (ref 8–23)
CO2: 28 mmol/L (ref 22–32)
Calcium: 9.1 mg/dL (ref 8.9–10.3)
Chloride: 94 mmol/L — ABNORMAL LOW (ref 98–111)
Creatinine, Ser: 0.85 mg/dL (ref 0.61–1.24)
GFR, Estimated: >60 mL/min (ref >60)
Glucose, Bld: 88 mg/dL (ref 70–99)
Potassium: 4.6 mmol/L (ref 3.5–5.1)
Sodium: 133 mmol/L — ABNORMAL LOW (ref 135–145)

## 2023-01-31 LAB — PHOSPHORUS
Phosphorus: 3.3 mg/dL (ref 2.5–4.6)
Phosphorus: 3.5 mg/dL (ref 2.5–4.6)

## 2023-01-31 LAB — CULTURE, RESPIRATORY W GRAM STAIN: Gram Stain: NONE SEEN

## 2023-01-31 LAB — CBC
HCT: 34.8 % — ABNORMAL LOW (ref 39.0–52.0)
Hemoglobin: 11 g/dL — ABNORMAL LOW (ref 13.0–17.0)
MCH: 28.3 pg (ref 26.0–34.0)
MCHC: 31.6 g/dL (ref 30.0–36.0)
MCV: 89.5 fL (ref 80.0–100.0)
Platelets: 211 10*3/uL (ref 150–400)
RBC: 3.89 MIL/uL — ABNORMAL LOW (ref 4.22–5.81)
RDW: 16.3 % — ABNORMAL HIGH (ref 11.5–15.5)
WBC: 13.8 10*3/uL — ABNORMAL HIGH (ref 4.0–10.5)
nRBC: 0 % (ref 0.0–0.2)

## 2023-01-31 LAB — GLUCOSE, CAPILLARY
Glucose-Capillary: 117 mg/dL — ABNORMAL HIGH (ref 70–99)
Glucose-Capillary: 130 mg/dL — ABNORMAL HIGH (ref 70–99)
Glucose-Capillary: 89 mg/dL (ref 70–99)
Glucose-Capillary: 93 mg/dL (ref 70–99)

## 2023-01-31 MED ORDER — SCOPOLAMINE 1 MG/3DAYS TD PT72
1.0000 | MEDICATED_PATCH | TRANSDERMAL | Status: DC
  Administered 2023-01-31 – 2023-02-03 (×2): 1.5 mg via TRANSDERMAL
  Filled 2023-01-31 (×2): qty 1

## 2023-01-31 MED ORDER — FERROUS SULFATE 300 (60 FE) MG/5ML PO SOLN
300.0000 mg | Freq: Every day | ORAL | Status: DC
  Administered 2023-02-02 – 2023-02-05 (×4): 300 mg
  Filled 2023-01-31 (×6): qty 5

## 2023-01-31 MED ORDER — ACETAMINOPHEN 325 MG PO TABS: 650.0000 mg | ORAL_TABLET | Freq: Four times a day (QID) | ORAL | Status: DC | PRN

## 2023-01-31 MED ORDER — SODIUM CHLORIDE 0.9 % IV SOLN
12.5000 mg | Freq: Four times a day (QID) | INTRAVENOUS | Status: DC | PRN
  Administered 2023-01-31: 12.5 mg via INTRAVENOUS
  Filled 2023-01-31: qty 12.5

## 2023-01-31 MED ORDER — SODIUM CHLORIDE 0.9 % IV BOLUS
500.0000 mL | Freq: Once | INTRAVENOUS | Status: AC
  Administered 2023-01-31: 500 mL via INTRAVENOUS

## 2023-01-31 MED ORDER — SODIUM CHLORIDE 0.9 % IV SOLN
2.0000 g | Freq: Three times a day (TID) | INTRAVENOUS | Status: DC
  Administered 2023-01-31 – 2023-02-04 (×11): 2 g via INTRAVENOUS
  Filled 2023-01-31 (×11): qty 12.5

## 2023-01-31 MED ORDER — HYDRALAZINE HCL 20 MG/ML IJ SOLN
10.0000 mg | Freq: Four times a day (QID) | INTRAMUSCULAR | Status: AC | PRN
  Administered 2023-02-01: 10 mg via INTRAVENOUS
  Filled 2023-01-31 (×2): qty 1

## 2023-01-31 MED ORDER — METOCLOPRAMIDE HCL 5 MG/ML IJ SOLN
5.0000 mg | Freq: Three times a day (TID) | INTRAMUSCULAR | Status: DC
  Administered 2023-01-31 – 2023-02-03 (×9): 5 mg via INTRAVENOUS
  Filled 2023-01-31 (×9): qty 2

## 2023-01-31 MED ORDER — ACETAMINOPHEN 650 MG RE SUPP: 650.0000 mg | Freq: Four times a day (QID) | RECTAL | Status: DC | PRN

## 2023-01-31 NOTE — Progress Notes (Addendum)
Speech Language Pathology Treatment: Dysphagia  Patient Details Name: Lee Barnes MRN: [id number] DOB: 04/30/1951 Today's Date: 01/31/2023 Time: 1120-1130 SLP Time Calculation (min) (ACUTE ONLY): 10 min  Assessment / Plan / Recommendation Clinical Impression  SLP followed up with pt regarding secretion management etc.  Two daughters in pt's room upon return - and they had questions regarding care plan.    Pt reports feeling "better" now. Daughters report pt has been coughing/vomiting since November of 2023 and state the only thing that had changed was his tube feeding.      Pt has not been consuming any water or ice over the last few months and no substantial water or ice over the last  year. Guaifenesin reported to be helpful for pt to thin secretions per daughter. SLP questions if pt is obtaining adequate hydration via his tube to help with thinning secretions as well - however family reports his sodium level gets depleted if he has too much water.    SLP questions if pt would benefit from humidification eg: humidifier, warm shower, etc.   Recommend to consume thin water and ice chips after mouth care with non-alcohol based cleaner to determine if helpful to decrease secretion viscosity.    Pt reports vomiting preempts hospital stay - family reports this will happen even a week and 1/2 prior to admit.   Advised pt against gargling with water as his lack of palatal closure allows overt aspiration of water with this posture.  Pt states he does not gargle after dental brushing or vomiting.  Discussed also to consider pt taking a warm shower or leaning of steaming water, consider using warm water for intake and consider humidifier to help thin secretions- If MD agrees.  Pt may benefit from calling his PCP when excessive vomiting episodes occur - ? If he may benefit from prophylactic ABX at that time?   Prior discussion had taken place with MD regarding potential for laryngectomy per pt/family -  but pt does not desire this due to concern for being able to speak- SLP mentioned TEP **transesophageal puncture* has been used for people s/p laryngectomy - but given pt's XRT - uncertain if his esophagus would be pliable enough to allow for adequate mobility.  But recommend he inquire to MD about this option if considering laryngectomy.    All offerings of tips to help manage secretions completed - no SLP follow up needed.      HPI HPI: 72 yo male adm to [facility] with recurrent aspiration pneumonia. 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 Pt has PEG for nutrition, reports issues with chronic cough and vomiting.  He has had mulitple swallow evaluations done and has maintained npo.  He was [facility] at [facility] 12/11/2022 at dysphagia clinic for dysphagia and dysphonia.  Thick sectretions, aspiration events, tube feeding issues, recurrent pnas, and poor voicing concerning per [facility] visit note.  Voice is worse with use and better with nothing per note.  FEES study completed with thin liquids and showed secretions retained in vallecular space and pyriform sinus - no movement of right vocal fold, aspiration during and after swallow with reflexive cough. Suspect aspiration after swallow - silent in nature.  Half of the bolus was retained in the oral cavitypost-swallow - no regurgitation.  It does not appear he was given anything beyond thin liquid via cup.  Pt reports he has  not been taking in water or ice.  ENT note indicates pt's aspiration will not be prevented and he may benefit from trying thin water to help thin secretions.  Intervention for VF immobility/voice also discussed.  Pt's primary ENT had spoken to pt regarding consideration for trachestomy and medication consideration for secretion management also discussed.  Speech eval ordered to offer recommendations for cough and secretion  management.      SLP Plan  All goals met      Recommendations for follow up therapy are one component of a multi-disciplinary discharge planning process, led by the attending physician.  Recommendations may be updated based on patient status, additional functional criteria and insurance authorization.    Recommendations  Medication Administration: Via alternative means Compensations: Slow rate                Oral Care Recommendations: Oral care QID Follow Up Recommendations: No SLP follow up Assistance recommended at discharge: Set up Supervision/Assistance SLP Visit Diagnosis: Dysphagia, pharyngoesophageal phase (R13.14);Dysphagia, pharyngeal phase (R13.13) Plan: All goals met         Kathleen Lime, MS [facility] SLP Acute Rehab Services Office [phone]   Macario Golds  01/31/2023, 11:47 AM

## 2023-01-31 NOTE — Progress Notes (Signed)
   01/31/23 1357  Assess: MEWS Score  Temp 99.6 F (37.6 C)  BP (!) 211/136  MAP (mmHg) 156  Pulse Rate (!) 127  Resp 18  Level of Consciousness Alert  SpO2 (!) 88 %  O2 Device Nasal Cannula  Patient Activity (if Appropriate) In bed  O2 Flow Rate (L/min) 2 L/min  Assess: MEWS Score  MEWS Temp 0  MEWS Systolic 2  MEWS Pulse 2  MEWS RR 0  MEWS LOC 0  MEWS Score 4  MEWS Score Color Red  Assess: if the MEWS score is Yellow or Red  Were vital signs taken at a resting state? Yes  Focused Assessment Change from prior assessment (see assessment flowsheet)  Does the patient meet 2 or more of the SIRS criteria? Yes  Does the patient have a confirmed or suspected source of infection? Yes  Provider and Rapid Response Notified? Yes  MEWS guidelines implemented  Yes, red  Treat  MEWS Interventions Considered administering scheduled or prn medications/treatments as ordered  Take Vital Signs  Increase Vital Sign Frequency  Red: Q1hr x2, continue Q4hrs until patient remains green for 12hrs  Escalate  MEWS: Escalate Red: Discuss with charge nurse and notify provider. Consider notifying RRT. If remains red for 2 hours consider need for higher level of care  Notify: Charge Nurse/RN  Name of Charge Nurse/RN Notified Russ Halo, RN  Provider Notification  Provider Name/Title Bonnielee Haff, MD  Date Provider Notified 01/31/23  Time Provider Notified 1351  Method of Notification Page;Face-to-face  Notification Reason Change in status (patient's elevated BP and elevated HR)  Provider response At bedside  Date of Provider Response 01/31/23  Time of Provider Response 1400  Notify: Rapid Response  Name of Rapid Response RN Notified Donnamarie Rossetti, RN (Rapid Response, RN  Date Rapid Response Notified 01/31/23  Time Rapid Response Notified 1409  Assess: SIRS CRITERIA  SIRS Temperature  0  SIRS Pulse 1  SIRS Respirations  0  SIRS WBC 1  SIRS Score Sum  2

## 2023-01-31 NOTE — Progress Notes (Signed)
PHARMACY NOTE -  Cefepime  Pharmacy has been assisting with dosing of Cefepime for PNA. Dosage remains stable at 2g IV q8 hr and further renal adjustments per institutional Pharmacy antibiotic protocol  Pharmacy will sign off, following peripherally for culture results, dose adjustments, and length of therapy. Please reconsult if a change in clinical status warrants re-evaluation of dosage.  Reuel Boom, PharmD, BCPS [phone] 01/31/2023, 6:27 PM

## 2023-01-31 NOTE — Consult Note (Signed)
   [facility] CM Inpatient Consult   01/31/2023  Lee Barnes 08-06-1951 [id number]  [facility] [ACO] Patient: Medicare [facility] Liaison remote coverage review for patient admitted to [facility]     Primary Care Provider:  Denita Lung, MD with [facility] which is listed to provide the transition of care follow up   Patient screened for less than 30 days readmission with 5 hospitalization's in the past 6 months with noted high risk score for unplanned readmission risk and to assess for potential [facility] service needs for post hospital transition for care coordination.  Review of patient's electronic medical record reveals patient is noted to be active with [facility] per notes. Patient was working with speech therapist.  Plan:  Continue to follow progress and disposition to assess for post hospital community care coordination/management needs.  Referral request for community care coordination:  Of note, [place] does not replace or interfere with any arrangements made by the Inpatient Transition of Care team.  For questions contact:   Natividad Brood, RN BSN [place]  [phone] business mobile phone Toll free office [phone]  *[place]  [phone] Fax number: [phone] [email or web address]

## 2023-01-31 NOTE — Evaluation (Signed)
Clinical/Bedside Swallow Evaluation Patient Details  Name: Lee Barnes MRN: [id number] Date of Birth: 10/21/1951  Today's Date: 01/31/2023 Time: SLP Start Time (ACUTE ONLY): 1010 SLP Stop Time (ACUTE ONLY): 1032 SLP Time Calculation (min) (ACUTE ONLY): 22 min  Past Medical History:  Past Medical History:  Diagnosis Date   Allergy to environmental factors    Basal cell carcinoma of left cheek 0000000   Complication of anesthesia    "vageled" after surgery -overnite stay   Diverticulosis 2008   Heart murmur    hx of in childhood    History of chemotherapy    History of radiation therapy 11/05/10-12/26/10   r base tongue, 7000 cGy 35 sessions   Hypertension    Medication started in May 2016.   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   Tinnitus    Past Surgical History:  Past Surgical History:  Procedure Laterality Date   APPENDECTOMY     COLONOSCOPY     INGUINAL HERNIA REPAIR Bilateral 03/30/2015   Procedure: LAPAROSCOPIC BILATERAL INGUINAL HERNIA REPAIR WITH MESH;  Surgeon: Coralie Keens, MD;  Location: [facility];  Service: General;  Laterality: Bilateral;   INGUINAL HERNIA REPAIR Right 11/18/1958   INGUINAL HERNIA REPAIR Bilateral 03/30/2015   INGUINAL HERNIA REPAIR Left 07/11/2015   INGUINAL HERNIA REPAIR N/A 07/11/2015   Procedure: REPAIR OF LEFT INGUINAL HERNIA WITH MESH;  Surgeon: Coralie Keens, MD;  Location: [facility];  Service: General;  Laterality: N/A;   INSERTION OF MESH N/A 07/11/2015   Procedure: INSERTION OF MESH;  Surgeon: Coralie Keens, MD;  Location: [facility];  Service: General;  Laterality: N/A;   IR [place] W/FLUORO  12/03/2021   LUMBAR [place] SURGERY     PEG PLACEMENT N/A 03/22/2022   Procedure: PERCUTANEOUS ENDOSCOPIC GASTROSTOMY (PEG) REPLACEMENT;  Surgeon: Carol Ada, MD;  Location: [facility];  Service: Gastroenterology;  Laterality: N/A;   SAVORY  DILATION N/A 03/22/2022   Procedure: SAVORY DILATION;  Surgeon: Carol Ada, MD;  Location: [facility];  Service: Gastroenterology;  Laterality: N/A;   SKIN BIOPSY Left 06/24/2022   basal cell carcinoma face cheek   TONSILLECTOMY     HPI:  72 yo male adm to [facility] with recurrent aspiration pneumonia. 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 Pt has PEG for nutrition, reports issues with chronic cough and vomiting.  He has had mulitple swallow evaluations done and has maintained npo.  He was seen at [facility] 12/11/2022 at dysphagia clinic for dysphagia and dysphonia.  Thick sectretions, aspiration events, tube feeding issues, recurrent pnas, and poor voicing concerning per [facility] visit note.  Voice is worse with use and better with nothing per note.  FEES study completed with thin liquids and showed secretions retained in vallecular space and pyriform sinus - no movement of right vocal fold, aspiration during and after swallow with reflexive cough. Suspect aspiration after swallow - silent in nature.  Half of the bolus was retained in the oral cavitypost-swallow - no regurgitation.  It does not appear he was given anything beyond thin liquid via cup.  Pt reports he has not been taking in water or ice.  ENT note indicates pt's aspiration will not be prevented and he may benefit from trying thin water to help thin secretions.  Intervention for VF immobility/voice also discussed.  Pt's primary ENT had spoken to pt regarding consideration for trachestomy and medication consideration for secretion management also discussed.  Speech eval ordered to offer recommendations for cough and secretion management.    Assessment / Plan / Recommendation  Clinical Impression  Pt known to this SLP from prior evaluations -Pt standing in BR brushing his teeth and gargling upon SLP entrance. This  prompts overt coughing and expectoration of viscous yellow/beige tinged secretion and then copious amounts of thin yellow liquid.  He advised he was nauseated prior to vomiting large amounts of thin secretions.  Concerning for aspiration of this emesis.  Pt verbalized that vomiting occurs prior to each hospital admit for aspiration pna.  Did not provide pt with po ice chips due to copious vomiting. He was seen at [facility] 12/11/2022 at dysphagia clinic for dysphagia and dysphonia. Thick sectretions, aspiration events, tube feeding issues, recurrent pnas, and poor voicing concerning per [facility] visit note. Voice is worse with use and better with nothing per note. FEES study completed with thin liquids and showed secretions retained in vallecular space and pyriform sinus - no movement of right vocal fold, aspiration during and after swallow with reflexive cough. Suspect aspiration after swallow - silent in nature. Half of the bolus was retained in the oral cavitypost-swallow - no regurgitation. It does not appear he was given anything beyond thin liquid via cup. Pt reports he has not been taking in water or ice. ENT note indicates pt's aspiration will not be prevented and he may benefit from trying thin water to help thin secretions. Pt states he will consume ice if approved byMD.  Educated him to benefit of oral care, pharynegal clearance/thinning of secretions with ice/water intake.  Also question if pt is receiving adequate water via his tube to help keep secretions thin. Intervention for VF immobility/voice also discussed. Pt's primary ENT had spoken to pt regarding consideration for trachestomy/laryngectomy and medication consideration for secretion management also discussed. SLP Visit Diagnosis: Dysphagia, pharyngoesophageal phase (R13.14);Dysphagia, pharyngeal phase (R13.13)    Aspiration Risk       Diet Recommendation Ice chips PRN after oral care   Medication Administration: Via alternative  means Supervision: Patient able to self feed Compensations: Slow rate Postural Changes: Seated upright at 90 degrees;Remain upright for at least 30 minutes after po intake    Other  Recommendations Oral Care Recommendations: Oral care QID    Recommendations for follow up therapy are one component of a multi-disciplinary discharge planning process, led by the attending physician.  Recommendations may be updated based on patient status, additional functional criteria and insurance authorization.  Follow up Recommendations No SLP follow up      Assistance Recommended at Discharge  N/a  Functional Status Assessment Patient has had a recent decline in their functional status and/or demonstrates limited ability to make significant improvements in function in a reasonable and predictable amount of time  Frequency and Duration min 1 x/week  1 week       Prognosis Prognosis for improved oropharyngeal function: Fair Barriers to Reach Goals: Severity of deficits N/a     Swallow Study   General Date of Onset: 01/31/23 HPI: 72 yo male adm to [facility] with recurrent aspiration pneumonia. 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 Pt has PEG for nutrition, reports issues with chronic cough and vomiting.  He  has had mulitple swallow evaluations done and has maintained npo.  He was seen at [facility] 12/11/2022 at dysphagia clinic for dysphagia and dysphonia.  Thick sectretions, aspiration events, tube feeding issues, recurrent pnas, and poor voicing concerning per [facility] visit note.  Voice is worse with use and better with nothing per note.  FEES study completed with thin liquids and showed secretions retained in vallecular space and pyriform sinus - no movement of right vocal fold, aspiration during and after swallow with reflexive cough. Suspect aspiration after swallow - silent in  nature.  Half of the bolus was retained in the oral cavitypost-swallow - no regurgitation.  It does not appear he was given anything beyond thin liquid via cup.  Pt reports he has not been taking in water or ice.  ENT note indicates pt's aspiration will not be prevented and he may benefit from trying thin water to help thin secretions.  Intervention for VF immobility/voice also discussed.  Pt's primary ENT had spoken to pt regarding consideration for trachestomy and medication consideration for secretion management also discussed.  Speech eval ordered to offer recommendations for cough and secretion management. Type of Study: Bedside Swallow Evaluation Diet Prior to this Study: NPO Temperature Spikes Noted: No Respiratory Status: Nasal cannula History of Recent Intubation: No Behavior/Cognition: Alert;Cooperative;Pleasant mood Oral Cavity Assessment: Excessive secretions Oral Care Completed by SLP: Yes (pt completed) Oral Cavity - Dentition: Poor condition Vision: Functional for self-feeding Self-Feeding Abilities: Able to feed self Patient Positioning: Other (comment) Baseline Vocal Quality: Low vocal intensity Volitional Cough: Strong Volitional Swallow: Able to elicit    Oral/Motor/Sensory Function Overall Oral Motor/Sensory Function: Mild impairment Facial ROM: Reduced right Facial Symmetry: Abnormal symmetry right Facial Strength: Within Functional Limits Lingual ROM: Other (Comment) Lingual Symmetry: Within Functional Limits Lingual Strength: Reduced Velum: Other (comment) (absent elevation on the right)   Ice Chips Ice chips: Not tested   Thin Liquid Thin Liquid: Impaired Presentation: Cup Oral Phase Functional Implications: Other (comment) (able to seal lips) Other Comments: pt rinsing and gargling with thin water and expectorating after dental brushing- prompting cough and expectoration of viscous yellow/tan tinged secretions initially - then later thin yellow secretions     Nectar Thick Nectar Thick Liquid: Not tested   Honey Thick Honey Thick Liquid: Not tested   Puree Puree: Not tested   Solid     Solid: Not tested      Macario Golds 01/31/2023,11:40 AM   Kathleen Lime, MS [place] Office [phone]

## 2023-01-31 NOTE — Progress Notes (Addendum)
RN made MD aware during the morning part of the shift that patient had been coughing up phlegm and vomiting what looked like some of the tube feeding. RN checked PEG tube residual and pulled back 5 mL (if that). Patient had no complaints of abdominal pain, just nausea and vomiting. RN stopped patient's tube feedings. MD put in new orders.   Notified MD again that patient had another episode of vomiting up phlegm and that he coughs before and after vomiting. RN reached out to MD to see if patient could get something for secretions. MD put in new orders.  Patient had another episode of vomiting where it put patient in Red MEWS due to elevated heart rate and elevated blood pressure. RN Environmental health practitioner, MD, and Rapid Response RN about change in patient. MD put in new orders. RN gave patient PRN meds for elevated BP and nausea/vomiting, as well as a 500 mL 0.9% NaCl bolus.   RN kept MD updated with patient's BP trending down, but HR still elevated. Patient ended up having a low-grade fever of 100.0, which RN gave PRN Tylenol suppository, cooled the room, and applied ice packs. PRN Tylenol suppository helped with patient's temperature and brought the heart rate down.

## 2023-01-31 NOTE — Plan of Care (Signed)

## 2023-01-31 NOTE — Progress Notes (Addendum)
[facility] PROGRESS NOTE   Lee Barnes [id number] DOB: 05-25-1951 DOA: 01/28/2023  PCP: Denita Lung, MD  Brief History/Interval Summary: 72 y.o. male with medical history significant for BCC and tongue base cancer who has a history of aspiration pneumonia last discharged from this hospital for the same about 1 month ago, he is feeding tube dependent and admitted to the hospital with recurrent aspiration pneumonia.  He was placed on high flow nasal cannula at 10 L/min.  Consultants: None  Procedures: None    Subjective/Interval History: Patient mentions that he is feeling better this morning.  Not as short of breath as yesterday.  Occasional cough.  No chest pain.  No nausea or vomiting.    Assessment/Plan:  Aspiration pneumonia/acute respiratory failure with hypoxia This is probably related to his history of throat cancer. Patient was started on Unasyn.  He was initially requiring 10 L of oxygen by high flow nasal cannula.  Oxygen has been weaned down.  Hopefully he can be weaned off of it completely prior to discharge. WBC is better.  He is afebrile.  Symptomatically he is improving. Abundant gram-negative rods on sputum culture so far.  Final identification and sensitivities are pending. Continue n.p.o. status.  Speech therapy to see. Patient with episodes of emesis. Abdomen benign on exam. X ray without acute findings. He then became hypoxic. He likely aspirated. Sputum c/s results available now. Will change to cefepime. Discussed with wife. Plan will be to stabilize him so that he can f/u with ENT at [facility] to re-consider laryngectomy. Patient has been hesitant in the past about this. If he does not want surgery then a more palliative approach can be considered.  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  He has had his G-tube for the last year and a half.  Continue tube feedings.  History of oropharyngeal cancer Stable  otherwise.  Hyperkalemia Resolved  Hyponatremia and hypophosphatemia Sodium level is stable.  Potassium level has improved.  Normocytic anemia Drop in hemoglobin is dilutional.  No evidence of overt blood loss.  Continue to monitor.  Severe protein calorie malnutrition Nutrition Problem: Severe Malnutrition Etiology: chronic illness (BCC and tongue base cancer)  DVT Prophylaxis: Lovenox Code Status: Full code Family Communication: Discussed with the patient Disposition Plan: Hopefully return home when improved.  Mobilize.  Status is: Inpatient Remains inpatient appropriate because: Aspiration pneumonia.  Acute respiratory failure with hypoxia.    Medications: Scheduled:  albuterol  2.5 mg Nebulization BID   enoxaparin (LOVENOX) injection  40 mg Subcutaneous Q24H   feeding supplement (OSMOLITE 1.5 CAL)  1,425 mL Per Tube Q24H   feeding supplement (PROSource TF20)  60 mL Per Tube Daily   ferrous sulfate  300 mg Per Tube Daily   fluticasone  2 spray Each Nare Daily   free water  150 mL Per Tube 5 X Daily   mouth rinse  15 mL Mouth Rinse 4 times per day   pantoprazole (PROTONIX) IV  40 mg Intravenous Daily   Continuous:  ampicillin-sulbactam (UNASYN) IV 3 g (01/31/23 0606)   [id number] **OR** acetaminophen, albuterol, hydrALAZINE, loperamide HCl, ondansetron (ZOFRAN) IV, mouth rinse, sodium chloride  Antibiotics: Anti-infectives (From admission, onward)    Start     Dose/Rate Route Frequency Ordered Stop   01/29/23 0600  Ampicillin-Sulbactam (UNASYN) 3 g in sodium chloride 0.9 % 100 mL IVPB        3 g 200 mL/hr over 30 Minutes Intravenous Every 6 hours 01/29/23 0130  01/28/23 2345  Ampicillin-Sulbactam (UNASYN) 3 g in sodium chloride 0.9 % 100 mL IVPB        3 g 200 mL/hr over 30 Minutes Intravenous  Once 01/28/23 2335 01/29/23 0111       Objective:  Vital Signs  Vitals:   01/31/23 0214 01/31/23 0500 01/31/23 0555 01/31/23 0928  BP: (!) 179/112  (!)  122/90   Pulse: 94  98   Resp:   20   Temp: 98.2 F (36.8 C)  98.7 F (37.1 C)   TempSrc: Oral  Oral   SpO2: 97%  96% 97%  Weight:  74.8 kg    Height:        Intake/Output Summary (Last 24 hours) at 01/31/2023 0957 Last data filed at 01/31/2023 [id number] Gross per 24 hour  Intake 3549.39 ml  Output 1350 ml  Net 2199.39 ml    Filed Weights   01/30/23 0500 01/30/23 1750 01/31/23 0500  Weight: 75.6 kg 75 kg 74.8 kg    General appearance: Awake alert.  In no distress Resp: Improved effort.  Crackles bilateral bases.  No wheezing or rhonchi.   Cardio: S1-S2 is normal regular.  No S3-S4.  No rubs murmurs or bruit GI: Abdomen is soft.  Nontender nondistended.  Bowel sounds are present normal.  No masses organomegaly Extremities: No edema.  Full range of motion of lower extremities. Neurologic: Alert and oriented x3.  No focal neurological deficits.     Lab Results:  Data Reviewed: I have personally reviewed following labs and reports of the imaging studies  CBC: Recent Labs  Lab 01/28/23 2225 01/29/23 0310 01/30/23 0514 01/31/23 0600  WBC 16.9* 7.9 15.1* 13.8*  NEUTROABS 15.8* 7.4  --   --   HGB 13.5 12.3* 10.7* 11.0*  HCT 43.4 38.1* 33.9* 34.8*  MCV 89.7 88.2 91.1 89.5  PLT 256 248 214 211     Basic Metabolic Panel: Recent Labs  Lab 01/28/23 2225 01/29/23 0310 01/30/23 0514 01/30/23 1720 01/31/23 0600  NA 128* 131* 133*  --  133*  K 4.6 5.2* 4.8  --  4.6  CL 94* 96* 95*  --  94*  CO2 27 25 28   --  28  GLUCOSE 114* 133* 106*  --  88  BUN 30* 31* 34*  --  30*  CREATININE 0.86 1.02 0.96  --  0.85  CALCIUM 8.9 8.5* 9.0  --  9.1  MG  --  2.6* 2.2 2.2 1.8  PHOS  --  3.1 2.0* 3.4 3.3     GFR: Estimated Creatinine Clearance: 84.3 mL/min (by C-G formula based on SCr of 0.85 mg/dL).  Liver Function Tests: Recent Labs  Lab 01/28/23 2225 01/29/23 0310  AST 29 26  ALT 20 17  ALKPHOS 108 79  BILITOT 0.6 0.6  PROT 8.8* 7.2  ALBUMIN 4.3 3.6       Coagulation Profile: Recent Labs  Lab 01/28/23 2225  INR 1.1      CBG: Recent Labs  Lab 01/30/23 1528 01/30/23 2021 01/31/23 0017 01/31/23 0438 01/31/23 0905  GLUCAP 152* 151* 93 89 130*      Recent Results (from the past 240 hour(s))  Blood Culture (routine x 2)     Status: None (Preliminary result)   Collection Time: 01/28/23 10:25 PM   Specimen: BLOOD RIGHT FOREARM  Result Value Ref Range Status   Specimen Description   Final    BLOOD RIGHT FOREARM Performed at [facility] [address]  [place], [place] [address]    Special Requests   Final    BOTTLES DRAWN AEROBIC AND ANAEROBIC Blood Culture adequate volume Performed at [facility] [address], [place], [place] [address]    Culture   Final    NO GROWTH 2 DAYS Performed at [facility] [address], [place], [place] [address]    Report Status PENDING  Incomplete  Resp panel by RT-PCR (RSV, Flu A&B, Covid)     Status: None   Collection Time: 01/28/23 10:29 PM   Specimen: Nasal Swab  Result Value Ref Range Status   SARS Coronavirus 2 by RT PCR NEGATIVE NEGATIVE Final    Comment: (NOTE) SARS-CoV-2 target nucleic acids are NOT DETECTED.  The SARS-CoV-2 RNA is generally detectable in upper respiratory specimens during the acute phase of infection. The lowest concentration of SARS-CoV-2 viral copies this assay can detect is 138 copies/mL. A negative result does not preclude SARS-Cov-2 infection and should not be used as the sole basis for treatment or other patient management decisions. A negative result may occur with  improper specimen collection/handling, submission of specimen other than nasopharyngeal swab, presence of viral mutation(s) within the areas targeted by this assay, and inadequate number of viral copies(<138 copies/mL). A negative result must be combined with clinical observations, patient history, and epidemiological information.  The expected result is Negative.  Fact Sheet for Patients:  EntrepreneurPulse.com.au  Fact Sheet for Healthcare Providers:  IncredibleEmployment.be  This test is no t yet approved or cleared by the [place] FDA and  has been authorized for detection and/or diagnosis of SARS-CoV-2 by FDA under an Emergency Use Authorization (EUA). This EUA will remain  in effect (meaning this test can be used) for the duration of the COVID-19 declaration under Section 564(b)(1) of the Act, 21 U.S.C.section 360bbb-3(b)(1), unless the authorization is terminated  or revoked sooner.       Influenza A by PCR NEGATIVE NEGATIVE Final   Influenza B by PCR NEGATIVE NEGATIVE Final    Comment: (NOTE) The Xpert Xpress SARS-CoV-2/FLU/RSV plus assay is intended as an aid in the diagnosis of influenza from Nasopharyngeal swab specimens and should not be used as a sole basis for treatment. Nasal washings and aspirates are unacceptable for Xpert Xpress SARS-CoV-2/FLU/RSV testing.  Fact Sheet for Patients: EntrepreneurPulse.com.au  Fact Sheet for Healthcare Providers: IncredibleEmployment.be  This test is not yet approved or cleared by the [place] FDA and has been authorized for detection and/or diagnosis of SARS-CoV-2 by FDA under an Emergency Use Authorization (EUA). This EUA will remain in effect (meaning this test can be used) for the duration of the COVID-19 declaration under Section 564(b)(1) of the Act, 21 U.S.C. section 360bbb-3(b)(1), unless the authorization is terminated or revoked.     Resp Syncytial Virus by PCR NEGATIVE NEGATIVE Final    Comment: (NOTE) Fact Sheet for Patients: EntrepreneurPulse.com.au  Fact Sheet for Healthcare Providers: IncredibleEmployment.be  This test is not yet approved or cleared by the [place] FDA and has been authorized for detection and/or  diagnosis of SARS-CoV-2 by FDA under an Emergency Use Authorization (EUA). This EUA will remain in effect (meaning this test can be used) for the duration of the COVID-19 declaration under Section 564(b)(1) of the Act, 21 U.S.C. section 360bbb-3(b)(1), unless the authorization is terminated or revoked.  Performed at [facility], [place] [address], [place], [place] [address]   Blood Culture (routine x 2)     Status: None (Preliminary  result)   Collection Time: 01/28/23 10:45 PM   Specimen: BLOOD  Result Value Ref Range Status   Specimen Description   Final    BLOOD BLOOD RIGHT WRIST Performed at [facility] [address] [place], [place] [address]    Special Requests   Final    BOTTLES DRAWN AEROBIC AND ANAEROBIC Blood Culture results may not be optimal due to an inadequate volume of blood received in culture bottles Performed at [facility] [address] [place], [place] [address]    Culture   Final    NO GROWTH 2 DAYS Performed at [facility] [address], [place], [place] [address]    Report Status PENDING  Incomplete  Expectorated Sputum Assessment w Gram Stain, Rflx to Resp Cult     Status: None   Collection Time: 01/29/23  4:20 AM   Specimen: Expectorated Sputum  Result Value Ref Range Status   Specimen Description EXPECTORATED SPUTUM  Final   Special Requests NONE  Final   Sputum evaluation   Final    THIS SPECIMEN IS ACCEPTABLE FOR SPUTUM CULTURE Performed at [facility], [place] [address] [place], [place] [address]    Report Status 01/29/2023 FINAL  Final  Culture, Respiratory w Gram Stain     Status: None (Preliminary result)   Collection Time: 01/29/23  4:20 AM  Result Value Ref Range Status   Specimen Description   Final    EXPECTORATED SPUTUM Performed at [facility] [address], [place], [place] [address]    Special Requests   Final    NONE  Reflexed from [phone] Performed at [facility] [address], [place], [place] [address]    Gram Stain   Final    NO WBC SEEN ABUNDANT GRAM NEGATIVE RODS FEW BUDDING YEAST SEEN RARE GRAM POSITIVE COCCI IN CLUSTERS    Culture   Final    ABUNDANT GRAM NEGATIVE RODS IDENTIFICATION AND SUSCEPTIBILITIES TO FOLLOW Performed at [facility], [place] [address] [place], [place] [address]    Report Status PENDING  Incomplete  MRSA Next Gen by PCR, Nasal     Status: None   Collection Time: 01/29/23  4:54 AM   Specimen: Nasal Mucosa; Nasal Swab  Result Value Ref Range Status   MRSA by PCR Next Gen NOT DETECTED NOT DETECTED Final    Comment: (NOTE) The GeneXpert MRSA Assay (FDA approved for NASAL specimens only), is one component of a comprehensive MRSA colonization surveillance program. It is not intended to diagnose MRSA infection nor to guide or monitor treatment for MRSA infections. Test performance is not FDA approved in patients less than 41 years old. Performed at [facility], [place] [address] [place], [place] [address]       Radiology Studies: No results found.     LOS: 2 days   [facility] Pager on [email or web address]  01/31/2023, 9:57 AM

## 2023-02-01 ENCOUNTER — Inpatient Hospital Stay (HOSPITAL_COMMUNITY): Payer: Medicare Other

## 2023-02-01 DIAGNOSIS — J9601 Acute respiratory failure with hypoxia: Secondary | ICD-10-CM | POA: Diagnosis not present

## 2023-02-01 DIAGNOSIS — J690 Pneumonitis due to inhalation of food and vomit: Secondary | ICD-10-CM | POA: Diagnosis not present

## 2023-02-01 LAB — BASIC METABOLIC PANEL
Anion gap: 10 (ref 5–15)
BUN: 32 mg/dL — ABNORMAL HIGH (ref 8–23)
CO2: 29 mmol/L (ref 22–32)
Calcium: 9.2 mg/dL (ref 8.9–10.3)
Chloride: 94 mmol/L — ABNORMAL LOW (ref 98–111)
Creatinine, Ser: 1.08 mg/dL (ref 0.61–1.24)
GFR, Estimated: >60 mL/min (ref >60)
Glucose, Bld: 87 mg/dL (ref 70–99)
Potassium: 4.6 mmol/L (ref 3.5–5.1)
Sodium: 133 mmol/L — ABNORMAL LOW (ref 135–145)

## 2023-02-01 LAB — CBC
HCT: 36.8 % — ABNORMAL LOW (ref 39.0–52.0)
Hemoglobin: 11.3 g/dL — ABNORMAL LOW (ref 13.0–17.0)
MCH: 27.7 pg (ref 26.0–34.0)
MCHC: 30.7 g/dL (ref 30.0–36.0)
MCV: 90.2 fL (ref 80.0–100.0)
Platelets: 227 10*3/uL (ref 150–400)
RBC: 4.08 MIL/uL — ABNORMAL LOW (ref 4.22–5.81)
RDW: 16.2 % — ABNORMAL HIGH (ref 11.5–15.5)
WBC: 10 10*3/uL (ref 4.0–10.5)
nRBC: 0 % (ref 0.0–0.2)

## 2023-02-01 MED ORDER — ORAL CARE MOUTH RINSE: 15.0000 mL | OROMUCOSAL | Status: DC | PRN

## 2023-02-01 MED ORDER — IOHEXOL 300 MG/ML  SOLN
100.0000 mL | Freq: Once | INTRAMUSCULAR | Status: AC | PRN
  Administered 2023-02-01: 100 mL via INTRAVENOUS

## 2023-02-01 MED ORDER — SODIUM CHLORIDE 0.9 % IV SOLN: INTRAVENOUS | Status: AC

## 2023-02-01 MED ORDER — IOHEXOL 9 MG/ML PO SOLN
500.0000 mL | ORAL | Status: AC
  Administered 2023-02-01 (×3): 500 mL via ORAL

## 2023-02-01 MED ORDER — SODIUM CHLORIDE 0.9 % IV BOLUS
500.0000 mL | Freq: Once | INTRAVENOUS | Status: AC
  Administered 2023-02-01: 500 mL via INTRAVENOUS

## 2023-02-01 NOTE — Progress Notes (Signed)
[facility] PROGRESS NOTE   Lee Barnes [id number] DOB: 1951-06-01 DOA: 01/28/2023  PCP: Denita Lung, MD  Brief History/Interval Summary: 72 y.o. male with medical history significant for BCC and tongue base cancer who has a history of aspiration pneumonia last discharged from this hospital for the same about 1 month ago, he is feeding tube dependent and admitted to the hospital with recurrent aspiration pneumonia.  He was placed on high flow nasal cannula at 10 L/min.  Consultants: None  Procedures: None    Subjective/Interval History: Patient mentions that he feels better this morning.  Denies any shortness of breath or chest pain.  No nausea or vomiting overnight.  Patient mentions that every so often he has episodes of emesis for which there is been no clear reason.  This has been going on for a while.    Assessment/Plan:  Aspiration pneumonia/acute respiratory failure with hypoxia This is probably related to his history of throat cancer. Patient was started on Unasyn.  He was initially requiring 10 L of oxygen by high flow nasal cannula.  Oxygen was weaned down.  He was doing better from a symptom standpoint as well.  He was transferred out of the ICU. On 3/15 patient had episodes of emesis following which he became hypoxic and tachycardic.  Oxygen had to be bumped up to 5 L/min.  Seems to be stable this morning. Sputum cultures positive for E. coli.  Previous cultures have been positive for Enterobacter.  Resistance noted to Unasyn.  Patient was changed over to cefepime. Reason for his recurrent nausea and vomiting is not clear.  Abdominal film did not show any acute findings.  Proceed with CT scan of the abdomen and pelvis. Continue n.p.o. status for now. Patient has been seen by ENT at [facility] and was offered laryngectomy but patient was hesitant.  He may need to revisit this.  He will need to be stable prior to considering surgery.  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  He has had his G-tube for the last year and a half.  Tube feedings currently on hold.  Consider reinitiating at a lower rate if CT of the abdomen pelvis does not show any acute findings.  Elevated blood pressure No known history of hypertension.  However he was noted to be extremely hypertensive yesterday when he had his episode of aspiration.  Currently on hydralazine as needed.  Monitor blood pressure trends.  History of oropharyngeal cancer Stable otherwise.  Hyperkalemia Resolved  Hyponatremia and hypophosphatemia Sodium level is stable.  Potassium level has improved.  Normocytic anemia Drop in hemoglobin is dilutional.  No evidence of overt blood loss.  Continue to monitor.  Severe protein calorie malnutrition Nutrition Problem: Severe Malnutrition Etiology: chronic illness (BCC and tongue base cancer)  DVT Prophylaxis: Lovenox Code Status: Full code Family Communication: Discussed with the patient Disposition Plan: Hopefully return home when improved.  Mobilize.  Status is: Inpatient Remains inpatient appropriate because: Aspiration pneumonia.  Acute respiratory failure with hypoxia.    Medications: Scheduled:  albuterol  2.5 mg Nebulization BID   enoxaparin (LOVENOX) injection  40 mg Subcutaneous Q24H   feeding supplement (PROSource TF20)  60 mL Per Tube Daily   ferrous sulfate  300 mg Per Tube Daily   fluticasone  2 spray Each Nare Daily   free water  150 mL Per Tube 5 X Daily   metoCLOPramide (REGLAN) injection  5 mg Intravenous Q8H   mouth rinse  15 mL Mouth  Rinse 4 times per day   pantoprazole (PROTONIX) IV  40 mg Intravenous Daily   scopolamine  1 patch Transdermal Q72H   Continuous:  ceFEPime (MAXIPIME) IV 2 g (02/01/23 [id number])   promethazine (PHENERGAN) injection (IM or IVPB) 12.5 mg (01/31/23 1521)   [id number] **OR** acetaminophen, albuterol, hydrALAZINE, loperamide HCl, ondansetron (ZOFRAN) IV, mouth rinse,  promethazine (PHENERGAN) injection (IM or IVPB), sodium chloride  Antibiotics: Anti-infectives (From admission, onward)    Start     Dose/Rate Route Frequency Ordered Stop   01/31/23 2000  ceFEPIme (MAXIPIME) 2 g in sodium chloride 0.9 % 100 mL IVPB        2 g 200 mL/hr over 30 Minutes Intravenous Every 8 hours 01/31/23 1827     01/29/23 0600  Ampicillin-Sulbactam (UNASYN) 3 g in sodium chloride 0.9 % 100 mL IVPB  Status:  Discontinued        3 g 200 mL/hr over 30 Minutes Intravenous Every 6 hours 01/29/23 0130 01/31/23 1824   01/28/23 2345  Ampicillin-Sulbactam (UNASYN) 3 g in sodium chloride 0.9 % 100 mL IVPB        3 g 200 mL/hr over 30 Minutes Intravenous  Once 01/28/23 2335 01/29/23 0111       Objective:  Vital Signs  Vitals:   01/31/23 2354 02/01/23 0427 02/01/23 0429 02/01/23 0801  BP: (!) 166/93 (!) 163/95  (!) 153/105  Pulse: 94 82  99  Resp:  17  18  Temp: 97.8 F (36.6 C) 98.3 F (36.8 C)  98.7 F (37.1 C)  TempSrc: Oral Oral  Oral  SpO2: 96% 92%  96%  Weight:   74.4 kg   Height:        Intake/Output Summary (Last 24 hours) at 02/01/2023 0835 Last data filed at 02/01/2023 0648 Gross per 24 hour  Intake 1595.64 ml  Output 1250 ml  Net 345.64 ml    Filed Weights   01/30/23 1750 01/31/23 0500 02/01/23 0429  Weight: 75 kg 74.8 kg 74.4 kg    General appearance: Awake alert.  In no distress Resp: Mildly tachypneic.  Coarse breath sounds bilaterally with crackles at the bases.  No wheezing or rhonchi. Cardio: S1-S2 is normal regular.  No S3-S4.  No rubs murmurs or bruit GI: Abdomen is soft.  Nontender nondistended.  Bowel sounds are present normal.  No masses organomegaly.  PEG tube is noted. Extremities: No edema.  Full range of motion of lower extremities. Neurologic: Alert and oriented x3.  No focal neurological deficits.     Lab Results:  Data Reviewed: I have personally reviewed following labs and reports of the imaging studies  CBC: Recent Labs   Lab 01/28/23 2225 01/29/23 0310 01/30/23 0514 01/31/23 0600 02/01/23 0528  WBC 16.9* 7.9 15.1* 13.8* 10.0  NEUTROABS 15.8* 7.4  --   --   --   HGB 13.5 12.3* 10.7* 11.0* 11.3*  HCT 43.4 38.1* 33.9* 34.8* 36.8*  MCV 89.7 88.2 91.1 89.5 90.2  PLT 256 248 214 211 227     Basic Metabolic Panel: Recent Labs  Lab 01/28/23 2225 01/29/23 0310 01/30/23 0514 01/30/23 1720 01/31/23 0600 01/31/23 1635 02/01/23 0528  NA 128* 131* 133*  --  133*  --  133*  K 4.6 5.2* 4.8  --  4.6  --  4.6  CL 94* 96* 95*  --  94*  --  94*  CO2 27 25 28   --  28  --  29  GLUCOSE 114* 133* 106*  --  88  --  87  BUN 30* 31* 34*  --  30*  --  32*  CREATININE 0.86 1.02 0.96  --  0.85  --  1.08  CALCIUM 8.9 8.5* 9.0  --  9.1  --  9.2  MG  --  2.6* 2.2 2.2 1.8 1.9  --   PHOS  --  3.1 2.0* 3.4 3.3 3.5  --      GFR: Estimated Creatinine Clearance: 66 mL/min (by C-G formula based on SCr of 1.08 mg/dL).  Liver Function Tests: Recent Labs  Lab 01/28/23 2225 01/29/23 0310  AST 29 26  ALT 20 17  ALKPHOS 108 79  BILITOT 0.6 0.6  PROT 8.8* 7.2  ALBUMIN 4.3 3.6      Coagulation Profile: Recent Labs  Lab 01/28/23 2225  INR 1.1      CBG: Recent Labs  Lab 01/30/23 2021 01/31/23 0017 01/31/23 0438 01/31/23 0905 01/31/23 1159  GLUCAP 151* 93 89 130* 117*      Recent Results (from the past 240 hour(s))  Blood Culture (routine x 2)     Status: None (Preliminary result)   Collection Time: 01/28/23 10:25 PM   Specimen: BLOOD RIGHT FOREARM  Result Value Ref Range Status   Specimen Description   Final    BLOOD RIGHT FOREARM Performed at [facility] [address], [place], [place] [address]    Special Requests   Final    BOTTLES DRAWN AEROBIC AND ANAEROBIC Blood Culture adequate volume Performed at [facility] [address], [place], [place] [address]    Culture   Final    NO GROWTH 3 DAYS Performed at [facility], [place] [address], [place], [place] [address]    Report Status PENDING  Incomplete  Resp panel by RT-PCR (RSV, Flu A&B, Covid)     Status: None   Collection Time: 01/28/23 10:29 PM   Specimen: Nasal Swab  Result Value Ref Range Status   SARS Coronavirus 2 by RT PCR NEGATIVE NEGATIVE Final    Comment: (NOTE) SARS-CoV-2 target nucleic acids are NOT DETECTED.  The SARS-CoV-2 RNA is generally detectable in upper respiratory specimens during the acute phase of infection. The lowest concentration of SARS-CoV-2 viral copies this assay can detect is 138 copies/mL. A negative result does not preclude SARS-Cov-2 infection and should not be used as the sole basis for treatment or other patient management decisions. A negative result may occur with  improper specimen collection/handling, submission of specimen other than nasopharyngeal swab, presence of viral mutation(s) within the areas targeted by this assay, and inadequate number of viral copies(<138 copies/mL). A negative result must be combined with clinical observations, patient history, and epidemiological information. The expected result is Negative.  Fact Sheet for Patients:  EntrepreneurPulse.com.au  Fact Sheet for Healthcare Providers:  IncredibleEmployment.be  This test is no t yet approved or cleared by the [place] FDA and  has been authorized for detection and/or diagnosis of SARS-CoV-2 by FDA under an Emergency Use Authorization (EUA). This EUA will remain  in effect (meaning this test can be used) for the duration of the COVID-19 declaration under Section 564(b)(1) of the Act, 21 U.S.C.section 360bbb-3(b)(1), unless the authorization is terminated  or revoked sooner.       Influenza A by PCR NEGATIVE NEGATIVE Final   Influenza B by PCR NEGATIVE NEGATIVE Final    Comment: (NOTE) The Xpert Xpress SARS-CoV-2/FLU/RSV plus assay is intended as an aid in the  diagnosis of influenza from Nasopharyngeal  swab specimens and should not be used as a sole basis for treatment. Nasal washings and aspirates are unacceptable for Xpert Xpress SARS-CoV-2/FLU/RSV testing.  Fact Sheet for Patients: EntrepreneurPulse.com.au  Fact Sheet for Healthcare Providers: IncredibleEmployment.be  This test is not yet approved or cleared by the [place] FDA and has been authorized for detection and/or diagnosis of SARS-CoV-2 by FDA under an Emergency Use Authorization (EUA). This EUA will remain in effect (meaning this test can be used) for the duration of the COVID-19 declaration under Section 564(b)(1) of the Act, 21 U.S.C. section 360bbb-3(b)(1), unless the authorization is terminated or revoked.     Resp Syncytial Virus by PCR NEGATIVE NEGATIVE Final    Comment: (NOTE) Fact Sheet for Patients: EntrepreneurPulse.com.au  Fact Sheet for Healthcare Providers: IncredibleEmployment.be  This test is not yet approved or cleared by the [place] FDA and has been authorized for detection and/or diagnosis of SARS-CoV-2 by FDA under an Emergency Use Authorization (EUA). This EUA will remain in effect (meaning this test can be used) for the duration of the COVID-19 declaration under Section 564(b)(1) of the Act, 21 U.S.C. section 360bbb-3(b)(1), unless the authorization is terminated or revoked.  Performed at [facility], [place] [address], [place], [place] [address]   Blood Culture (routine x 2)     Status: None (Preliminary result)   Collection Time: 01/28/23 10:45 PM   Specimen: BLOOD  Result Value Ref Range Status   Specimen Description   Final    BLOOD BLOOD RIGHT WRIST Performed at [facility] [address] [place], [place] [address]    Special Requests   Final    BOTTLES DRAWN AEROBIC AND ANAEROBIC Blood Culture results may not be optimal due to an inadequate volume of blood  received in culture bottles Performed at [facility] [address] [place], [place] [address]    Culture   Final    NO GROWTH 3 DAYS Performed at [facility], [place] [address], [place], [place] [address]    Report Status PENDING  Incomplete  Expectorated Sputum Assessment w Gram Stain, Rflx to Resp Cult     Status: None   Collection Time: 01/29/23  4:20 AM   Specimen: Expectorated Sputum  Result Value Ref Range Status   Specimen Description EXPECTORATED SPUTUM  Final   Special Requests NONE  Final   Sputum evaluation   Final    THIS SPECIMEN IS ACCEPTABLE FOR SPUTUM CULTURE Performed at [facility], [place] [address], [place], [place] [address]    Report Status 01/29/2023 FINAL  Final  Culture, Respiratory w Gram Stain     Status: None   Collection Time: 01/29/23  4:20 AM  Result Value Ref Range Status   Specimen Description   Final    EXPECTORATED SPUTUM Performed at [facility] [address], [place], [place] [address]    Special Requests   Final    NONE Reflexed from [id number] Performed at [facility] [address], [place], [place] [address]    Gram Stain   Final    NO WBC SEEN ABUNDANT GRAM NEGATIVE RODS FEW BUDDING YEAST SEEN RARE GRAM POSITIVE COCCI IN CLUSTERS    Culture   Final    ABUNDANT ESCHERICHIA COLI NO STAPHYLOCOCCUS AUREUS ISOLATED No Pseudomonas species isolated Performed at [facility], [address] [place], [place] [address]    Report Status  01/31/2023 FINAL  Final   Organism ID, Bacteria ESCHERICHIA COLI  Final      Susceptibility   Escherichia coli - MIC*    AMPICILLIN >=32 RESISTANT Resistant     CEFEPIME <=0.12 SENSITIVE Sensitive     CEFTAZIDIME <=1 SENSITIVE Sensitive     CEFTRIAXONE 1 SENSITIVE Sensitive     CIPROFLOXACIN <=0.25 SENSITIVE Sensitive     GENTAMICIN <=1 SENSITIVE Sensitive     IMIPENEM <=0.25 SENSITIVE Sensitive     TRIMETH/SULFA  <=20 SENSITIVE Sensitive     AMPICILLIN/SULBACTAM >=32 RESISTANT Resistant     PIP/TAZO 8 SENSITIVE Sensitive     * ABUNDANT ESCHERICHIA COLI  MRSA Next Gen by PCR, Nasal     Status: None   Collection Time: 01/29/23  4:54 AM   Specimen: Nasal Mucosa; Nasal Swab  Result Value Ref Range Status   MRSA by PCR Next Gen NOT DETECTED NOT DETECTED Final    Comment: (NOTE) The GeneXpert MRSA Assay (FDA approved for NASAL specimens only), is one component of a comprehensive MRSA colonization surveillance program. It is not intended to diagnose MRSA infection nor to guide or monitor treatment for MRSA infections. Test performance is not FDA approved in patients less than 62 years old. Performed at [facility], [place] [address] [place], [place] [address]       Radiology Studies: DG Abd Portable 2V  Result Date: 01/31/2023 CLINICAL DATA:  Nausea and vomiting EXAM: PORTABLE ABDOMEN - 2 VIEW COMPARISON:  Chest radiograph 01/28/2023. FINDINGS: 2 supine views of the abdomen and pelvis. Marked right hemidiaphragm elevation. Bibasilar airspace disease. Motion degradation on the semi-erect view of the lower chest and upper abdomen. No gross free intraperitoneal air or significant air-fluid levels. Numerous leads and wires project over the chest and abdomen. Supine image of the lower abdomen and pelvis demonstrates no gaseous distention of bowel loops. Distal gas and stool. IMPRESSION: No acute process or explanation for patient's symptoms Electronically Signed   By: Abigail Miyamoto M.D.   On: 01/31/2023 12:49       LOS: 3 days   [facility] Pager on [email or web address]  02/01/2023, 8:35 AM

## 2023-02-01 NOTE — Plan of Care (Signed)

## 2023-02-01 NOTE — Progress Notes (Addendum)
       Overnight   NAME: Lee Barnes MRN: [id number] DOB : 25-Dec-1950    Date of Service   02/01/2023   HPI/Events of Note   Notified by RN for Completion of CT and for Patient request for some form of intake due to concern over his intake.  Physician notation : "Consider reinitiating at a lower rate if CT of the abdomen pelvis does not show any acute findings." ===================================================== Imaging in part :  IMPRESSION: 1. No acute findings in the abdomen or pelvis. Specifically, no findings to explain the patient's history of nausea and vomiting. 2. Bibasilar collapse/consolidation in the lungs. Pneumonia a concern. 3. Left colonic diverticulosis without diverticulitis. 4. Prostatomegaly. 5.  Aortic Atherosclerosis (ICD10-I70.0).     Electronically Signed   By: Misty Stanley M.D.   On: 02/01/2023 20:12    Interventions/ Plan   Half-dose Prosource after administration of Scheduled Reglan and/ or available Zofran Remainder of dose in 1 hour if no reflux, Nausea or vomiting       Update: Patient refuses Prosource as above     Gershon Cull BSN MSNA MSN [place]

## 2023-02-02 DIAGNOSIS — J690 Pneumonitis due to inhalation of food and vomit: Secondary | ICD-10-CM | POA: Diagnosis not present

## 2023-02-02 DIAGNOSIS — J9601 Acute respiratory failure with hypoxia: Secondary | ICD-10-CM | POA: Diagnosis not present

## 2023-02-02 LAB — CULTURE, BLOOD (ROUTINE X 2)
Culture: NO GROWTH
Culture: NO GROWTH
Special Requests: ADEQUATE

## 2023-02-02 LAB — BASIC METABOLIC PANEL
Anion gap: 10 (ref 5–15)
BUN: 28 mg/dL — ABNORMAL HIGH (ref 8–23)
CO2: 25 mmol/L (ref 22–32)
Calcium: 8.8 mg/dL — ABNORMAL LOW (ref 8.9–10.3)
Chloride: 96 mmol/L — ABNORMAL LOW (ref 98–111)
Creatinine, Ser: 1.05 mg/dL (ref 0.61–1.24)
GFR, Estimated: >60 mL/min (ref >60)
Glucose, Bld: 67 mg/dL — ABNORMAL LOW (ref 70–99)
Potassium: 4.2 mmol/L (ref 3.5–5.1)
Sodium: 131 mmol/L — ABNORMAL LOW (ref 135–145)

## 2023-02-02 LAB — CBC
HCT: 33.7 % — ABNORMAL LOW (ref 39.0–52.0)
Hemoglobin: 10.8 g/dL — ABNORMAL LOW (ref 13.0–17.0)
MCH: 28.4 pg (ref 26.0–34.0)
MCHC: 32 g/dL (ref 30.0–36.0)
MCV: 88.7 fL (ref 80.0–100.0)
Platelets: 213 10*3/uL (ref 150–400)
RBC: 3.8 MIL/uL — ABNORMAL LOW (ref 4.22–5.81)
RDW: 15.9 % — ABNORMAL HIGH (ref 11.5–15.5)
WBC: 8.3 10*3/uL (ref 4.0–10.5)
nRBC: 0 % (ref 0.0–0.2)

## 2023-02-02 LAB — GLUCOSE, CAPILLARY
Glucose-Capillary: 104 mg/dL — ABNORMAL HIGH (ref 70–99)
Glucose-Capillary: 122 mg/dL — ABNORMAL HIGH (ref 70–99)
Glucose-Capillary: 122 mg/dL — ABNORMAL HIGH (ref 70–99)
Glucose-Capillary: 75 mg/dL (ref 70–99)
Glucose-Capillary: 95 mg/dL (ref 70–99)

## 2023-02-02 MED ORDER — DEXTROSE 50 % IV SOLN
0.5000 | Freq: Once | INTRAVENOUS | Status: AC
  Administered 2023-02-02: 25 mL via INTRAVENOUS
  Filled 2023-02-02: qty 50

## 2023-02-02 MED ORDER — FREE WATER
100.0000 mL | Freq: Four times a day (QID) | Status: DC
  Administered 2023-02-02 – 2023-02-05 (×12): 100 mL

## 2023-02-02 MED ORDER — OSMOLITE 1.2 CAL PO LIQD
1000.0000 mL | ORAL | Status: DC
  Administered 2023-02-02: 1000 mL
  Filled 2023-02-02: qty 1000

## 2023-02-02 NOTE — Progress Notes (Signed)
[facility] PROGRESS NOTE   Lee Barnes [id number] DOB: 03-23-1951 DOA: 01/28/2023  PCP: Denita Lung, MD  Brief History/Interval Summary: 72 y.o. male with medical history significant for BCC and tongue base cancer who has a history of aspiration pneumonia last discharged from this hospital for the same about 1 month ago, he is feeding tube dependent and admitted to the hospital with recurrent aspiration pneumonia.  He was placed on high flow nasal cannula at 10 L/min.  Consultants: None  Procedures: None    Subjective/Interval History: Patient denies any complaints this morning.  Shortness of breath is improved.  No further nausea or vomiting.  No chest pain.      Assessment/Plan:  Aspiration pneumonia/acute respiratory failure with hypoxia This is probably related to his history of throat cancer. Patient was started on Unasyn.  He was initially requiring 10 L of oxygen by high flow nasal cannula.  Oxygen was weaned down.  He was doing better from a symptom standpoint as well.  He was transferred out of the ICU. On 3/15 patient had episodes of emesis following which he became hypoxic and tachycardic.  Oxygen had to be bumped up to 5 L/min.   Sputum cultures positive for E. coli.  Previous cultures have been positive for Enterobacter.  Resistance noted to Unasyn.  Patient was changed over to cefepime. Reason for his recurrent nausea and vomiting is not clear.  Abdominal film did not show any acute findings.  CT scan was done which also does not show any significant findings.  Continue with Reglan for now.  Will change to enteral route from tomorrow depending on how he tolerates his tube feedings today.   Patient has been seen by ENT at [facility] and was offered laryngectomy but patient was hesitant.  He may need to revisit this.  He will need to be stable prior to considering surgery. Respiratory status has stabilized again.  Currently on 3 L of oxygen via nasal  cannula.  Continue to wean down to maintain saturations greater than 90%.  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  He has had his G-tube for the last year and a half.   Will resume tube feedings at a low rate today.  Elevated blood pressure No known history of hypertension.  Patient mentions that ever since his radiation treatment for his throat cancer several years ago his blood pressure fluctuates quite a bit.  Continue to monitor for now.  Hold off on any scheduled antihypertensives.    History of oropharyngeal cancer Stable otherwise.  Hyperkalemia Resolved  Hyponatremia and hypophosphatemia Continue to monitor electrolytes.  Getting normal saline infusion.  Normocytic anemia Drop in hemoglobin is dilutional.  No evidence of overt blood loss.  Continue to monitor.  Severe protein calorie malnutrition Nutrition Problem: Severe Malnutrition Etiology: chronic illness (BCC and tongue base cancer)  DVT Prophylaxis: Lovenox Code Status: Full code Family Communication: Discussed with the patient.  Will update his wife Disposition Plan: Hopefully return home when improved.  Mobilize.  Status is: Inpatient Remains inpatient appropriate because: Aspiration pneumonia.  Acute respiratory failure with hypoxia.    Medications: Scheduled:  albuterol  2.5 mg Nebulization BID   enoxaparin (LOVENOX) injection  40 mg Subcutaneous Q24H   feeding supplement (PROSource TF20)  60 mL Per Tube Daily   ferrous sulfate  300 mg Per Tube Daily   fluticasone  2 spray Each Nare Daily   free water  100 mL Per Tube Q6H   metoCLOPramide (  REGLAN) injection  5 mg Intravenous Q8H   mouth rinse  15 mL Mouth Rinse 4 times per day   pantoprazole (PROTONIX) IV  40 mg Intravenous Daily   scopolamine  1 patch Transdermal Q72H   Continuous:  sodium chloride 50 mL/hr at 02/02/23 0520   ceFEPime (MAXIPIME) IV 2 g (02/02/23 0521)   feeding supplement (OSMOLITE 1.2 CAL)     promethazine  (PHENERGAN) injection (IM or IVPB) 12.5 mg (01/31/23 1521)   [id number] **OR** acetaminophen, albuterol, loperamide HCl, ondansetron (ZOFRAN) IV, mouth rinse, promethazine (PHENERGAN) injection (IM or IVPB), sodium chloride  Antibiotics: Anti-infectives (From admission, onward)    Start     Dose/Rate Route Frequency Ordered Stop   01/31/23 2000  ceFEPIme (MAXIPIME) 2 g in sodium chloride 0.9 % 100 mL IVPB        2 g 200 mL/hr over 30 Minutes Intravenous Every 8 hours 01/31/23 1827     01/29/23 0600  Ampicillin-Sulbactam (UNASYN) 3 g in sodium chloride 0.9 % 100 mL IVPB  Status:  Discontinued        3 g 200 mL/hr over 30 Minutes Intravenous Every 6 hours 01/29/23 0130 01/31/23 1824   01/28/23 2345  Ampicillin-Sulbactam (UNASYN) 3 g in sodium chloride 0.9 % 100 mL IVPB        3 g 200 mL/hr over 30 Minutes Intravenous  Once 01/28/23 2335 01/29/23 0111       Objective:  Vital Signs  Vitals:   02/02/23 0458 02/02/23 0500 02/02/23 0500 02/02/23 0513  BP: (!) 186/125  (!) 191/112 (!) 166/94  Pulse: (!) 103  99 96  Resp: 17     Temp: 97.9 F (36.6 C)     TempSrc: Oral     SpO2: 97%     Weight:  74.4 kg    Height:        Intake/Output Summary (Last 24 hours) at 02/02/2023 0852 Last data filed at 02/02/2023 0715 Gross per 24 hour  Intake 765.88 ml  Output 2710 ml  Net -1944.12 ml    Filed Weights   01/31/23 0500 02/01/23 0429 02/02/23 0500  Weight: 74.8 kg 74.4 kg 74.4 kg    General appearance: Awake alert.  In no distress Resp: Coarse breath sounds at the bases with crackles.  No wheezing or rhonchi.  Normal effort at rest. Cardio: S1-S2 is normal regular.  No S3-S4.  No rubs murmurs or bruit GI: Abdomen is soft.  Nontender nondistended.  Bowel sounds are present normal.  No masses organomegaly.  PEG tube is noted Extremities: No edema.  Full range of motion of lower extremities. Neurologic: Alert and oriented x3.  No focal neurological deficits.      Lab  Results:  Data Reviewed: I have personally reviewed following labs and reports of the imaging studies  CBC: Recent Labs  Lab 01/28/23 2225 01/29/23 0310 01/30/23 0514 01/31/23 0600 02/01/23 0528 02/02/23 0503  WBC 16.9* 7.9 15.1* 13.8* 10.0 8.3  NEUTROABS 15.8* 7.4  --   --   --   --   HGB 13.5 12.3* 10.7* 11.0* 11.3* 10.8*  HCT 43.4 38.1* 33.9* 34.8* 36.8* 33.7*  MCV 89.7 88.2 91.1 89.5 90.2 88.7  PLT 256 248 214 211 227 213     Basic Metabolic Panel: Recent Labs  Lab 01/29/23 0310 01/30/23 0514 01/30/23 1720 01/31/23 0600 01/31/23 1635 02/01/23 0528 02/02/23 0503  NA 131* 133*  --  133*  --  133* 131*  K 5.2* 4.8  --  4.6  --  4.6 4.2  CL 96* 95*  --  94*  --  94* 96*  CO2 25 28  --  28  --  29 25  GLUCOSE 133* 106*  --  88  --  87 67*  BUN 31* 34*  --  30*  --  32* 28*  CREATININE 1.02 0.96  --  0.85  --  1.08 1.05  CALCIUM 8.5* 9.0  --  9.1  --  9.2 8.8*  MG 2.6* 2.2 2.2 1.8 1.9  --   --   PHOS 3.1 2.0* 3.4 3.3 3.5  --   --      GFR: Estimated Creatinine Clearance: 67.9 mL/min (by C-G formula based on SCr of 1.05 mg/dL).  Liver Function Tests: Recent Labs  Lab 01/28/23 2225 01/29/23 0310  AST 29 26  ALT 20 17  ALKPHOS 108 79  BILITOT 0.6 0.6  PROT 8.8* 7.2  ALBUMIN 4.3 3.6      Coagulation Profile: Recent Labs  Lab 01/28/23 2225  INR 1.1      CBG: Recent Labs  Lab 01/30/23 2021 01/31/23 0017 01/31/23 0438 01/31/23 0905 01/31/23 1159  GLUCAP 151* 93 89 130* 117*      Recent Results (from the past 240 hour(s))  Blood Culture (routine x 2)     Status: None   Collection Time: 01/28/23 10:25 PM   Specimen: BLOOD RIGHT FOREARM  Result Value Ref Range Status   Specimen Description   Final    BLOOD RIGHT FOREARM Performed at [facility] [address] [place], [place] [address]    Special Requests   Final    BOTTLES DRAWN AEROBIC AND ANAEROBIC Blood Culture adequate volume Performed at [facility] [address], [place], [place] [address]    Culture   Final    NO GROWTH 5 DAYS Performed at [facility], [place] [address], [place], [place] [address]    Report Status 02/02/2023 FINAL  Final  Resp panel by RT-PCR (RSV, Flu A&B, Covid)     Status: None   Collection Time: 01/28/23 10:29 PM   Specimen: Nasal Swab  Result Value Ref Range Status   SARS Coronavirus 2 by RT PCR NEGATIVE NEGATIVE Final    Comment: (NOTE) SARS-CoV-2 target nucleic acids are NOT DETECTED.  The SARS-CoV-2 RNA is generally detectable in upper respiratory specimens during the acute phase of infection. The lowest concentration of SARS-CoV-2 viral copies this assay can detect is 138 copies/mL. A negative result does not preclude SARS-Cov-2 infection and should not be used as the sole basis for treatment or other patient management decisions. A negative result may occur with  improper specimen collection/handling, submission of specimen other than nasopharyngeal swab, presence of viral mutation(s) within the areas targeted by this assay, and inadequate number of viral copies(<138 copies/mL). A negative result must be combined with clinical observations, patient history, and epidemiological information. The expected result is Negative.  Fact Sheet for Patients:  EntrepreneurPulse.com.au  Fact Sheet for Healthcare Providers:  IncredibleEmployment.be  This test is no t yet approved or cleared by the [place] FDA and  has been authorized for detection and/or diagnosis of SARS-CoV-2 by FDA under an Emergency Use Authorization (EUA). This EUA will remain  in effect (meaning this test can be used) for the duration of the COVID-19 declaration under Section 564(b)(1) of the Act, 21 U.S.C.section 360bbb-3(b)(1), unless the authorization is terminated  or revoked sooner.  Influenza A by PCR NEGATIVE NEGATIVE Final   Influenza B by PCR  NEGATIVE NEGATIVE Final    Comment: (NOTE) The Xpert Xpress SARS-CoV-2/FLU/RSV plus assay is intended as an aid in the diagnosis of influenza from Nasopharyngeal swab specimens and should not be used as a sole basis for treatment. Nasal washings and aspirates are unacceptable for Xpert Xpress SARS-CoV-2/FLU/RSV testing.  Fact Sheet for Patients: EntrepreneurPulse.com.au  Fact Sheet for Healthcare Providers: IncredibleEmployment.be  This test is not yet approved or cleared by the [place] FDA and has been authorized for detection and/or diagnosis of SARS-CoV-2 by FDA under an Emergency Use Authorization (EUA). This EUA will remain in effect (meaning this test can be used) for the duration of the COVID-19 declaration under Section 564(b)(1) of the Act, 21 U.S.C. section 360bbb-3(b)(1), unless the authorization is terminated or revoked.     Resp Syncytial Virus by PCR NEGATIVE NEGATIVE Final    Comment: (NOTE) Fact Sheet for Patients: EntrepreneurPulse.com.au  Fact Sheet for Healthcare Providers: IncredibleEmployment.be  This test is not yet approved or cleared by the [place] FDA and has been authorized for detection and/or diagnosis of SARS-CoV-2 by FDA under an Emergency Use Authorization (EUA). This EUA will remain in effect (meaning this test can be used) for the duration of the COVID-19 declaration under Section 564(b)(1) of the Act, 21 U.S.C. section 360bbb-3(b)(1), unless the authorization is terminated or revoked.  Performed at [facility], [place] [address] [place], [place] [address]   Blood Culture (routine x 2)     Status: None   Collection Time: 01/28/23 10:45 PM   Specimen: BLOOD  Result Value Ref Range Status   Specimen Description   Final    BLOOD BLOOD RIGHT WRIST Performed at [facility] [address], [place], [place] [address]     Special Requests   Final    BOTTLES DRAWN AEROBIC AND ANAEROBIC Blood Culture results may not be optimal due to an inadequate volume of blood received in culture bottles Performed at [facility] [address] [place], [place] [address]    Culture   Final    NO GROWTH 5 DAYS Performed at [facility], [place] [address] [place], [place] [address]    Report Status 02/02/2023 FINAL  Final  Expectorated Sputum Assessment w Gram Stain, Rflx to Resp Cult     Status: None   Collection Time: 01/29/23  4:20 AM   Specimen: Expectorated Sputum  Result Value Ref Range Status   Specimen Description EXPECTORATED SPUTUM  Final   Special Requests NONE  Final   Sputum evaluation   Final    THIS SPECIMEN IS ACCEPTABLE FOR SPUTUM CULTURE Performed at [facility], [place] [address] [place], [place] [address]    Report Status 01/29/2023 FINAL  Final  Culture, Respiratory w Gram Stain     Status: None   Collection Time: 01/29/23  4:20 AM  Result Value Ref Range Status   Specimen Description   Final    EXPECTORATED SPUTUM Performed at [facility] [address] [place], [place] [address]    Special Requests   Final    NONE Reflexed from [id number] Performed at [facility] [address], [place], [place] [address]    Gram Stain   Final    NO WBC SEEN ABUNDANT GRAM NEGATIVE RODS FEW BUDDING YEAST SEEN RARE GRAM POSITIVE COCCI IN CLUSTERS    Culture  Final    ABUNDANT ESCHERICHIA COLI NO STAPHYLOCOCCUS AUREUS ISOLATED No Pseudomonas species isolated Performed at [facility] [address] [place], [place] [address]    Report Status 01/31/2023 FINAL  Final   Organism ID, Bacteria ESCHERICHIA COLI  Final      Susceptibility   Escherichia coli - MIC*    AMPICILLIN >=32 RESISTANT Resistant     CEFEPIME <=0.12 SENSITIVE Sensitive     CEFTAZIDIME <=1 SENSITIVE Sensitive     CEFTRIAXONE 1 SENSITIVE  Sensitive     CIPROFLOXACIN <=0.25 SENSITIVE Sensitive     GENTAMICIN <=1 SENSITIVE Sensitive     IMIPENEM <=0.25 SENSITIVE Sensitive     TRIMETH/SULFA <=20 SENSITIVE Sensitive     AMPICILLIN/SULBACTAM >=32 RESISTANT Resistant     PIP/TAZO 8 SENSITIVE Sensitive     * ABUNDANT ESCHERICHIA COLI  MRSA Next Gen by PCR, Nasal     Status: None   Collection Time: 01/29/23  4:54 AM   Specimen: Nasal Mucosa; Nasal Swab  Result Value Ref Range Status   MRSA by PCR Next Gen NOT DETECTED NOT DETECTED Final    Comment: (NOTE) The GeneXpert MRSA Assay (FDA approved for NASAL specimens only), is one component of a comprehensive MRSA colonization surveillance program. It is not intended to diagnose MRSA infection nor to guide or monitor treatment for MRSA infections. Test performance is not FDA approved in patients less than 57 years old. Performed at [facility], [place] [address] [place], [place] [address]       Radiology Studies: CT ABDOMEN PELVIS W CONTRAST  Result Date: 02/01/2023 CLINICAL DATA:  Recurrent nausea and vomiting. EXAM: CT ABDOMEN AND PELVIS WITH CONTRAST TECHNIQUE: Multidetector CT imaging of the abdomen and pelvis was performed using the standard protocol following bolus administration of intravenous contrast. RADIATION DOSE REDUCTION: This exam was performed according to the departmental dose-optimization program which includes automated exposure control, adjustment of the mA and/or kV according to patient size and/or use of iterative reconstruction technique. CONTRAST:  162mL OMNIPAQUE IOHEXOL 300 MG/ML  SOLN COMPARISON:  05/23/2015 FINDINGS: Lower chest: Patchy airspace disease noted left lower lobe potentially atelectasis although pneumonia a concern. Collapse/consolidation in the right lower lobe is only seen on renal delay imaging as this portion of the chest was not included on the initial study. Hepatobiliary: No suspicious focal abnormality within the liver  parenchyma. There is no evidence for gallstones, gallbladder wall thickening, or pericholecystic fluid. No intrahepatic or extrahepatic biliary dilation. Pancreas: No focal mass lesion. No dilatation of the main duct. No intraparenchymal cyst. No peripancreatic edema. Spleen: No splenomegaly. No focal mass lesion. Adrenals/Urinary Tract: No adrenal nodule or mass. Cortical scarring noted lower pole right kidney. 2.3 cm simple cyst in the interpolar right kidney was present previously. No followup imaging is recommended. Tiny hypoattenuating lesions in the left kidney are too small to characterize but likely benign. No followup imaging is recommended. No evidence for hydroureter. The urinary bladder appears normal for the degree of distention. Stomach/Bowel: Stomach is unremarkable. No gastric wall thickening. No evidence of outlet obstruction. Gastrostomy tube noted with tip in the gastric lumen. Duodenum is normally positioned as is the ligament of Treitz. Duodenal diverticulum noted. No small bowel wall thickening. No small bowel dilatation. The terminal ileum is normal. The appendix is not well visualized, but there is no edema or inflammation in the region of the cecum. No gross colonic mass. No colonic wall thickening. Diverticular changes are noted in the left  colon without evidence of diverticulitis. Vascular/Lymphatic: There is moderate atherosclerotic calcification of the abdominal aorta without aneurysm. Small penetrating ulcer or ulcerated plaque noted infrarenal abdominal aorta. There is no gastrohepatic or hepatoduodenal ligament lymphadenopathy. No retroperitoneal or mesenteric lymphadenopathy. No pelvic sidewall lymphadenopathy. Reproductive: Prostate gland is enlarged. Other: No intraperitoneal free fluid. Musculoskeletal: No worrisome lytic or sclerotic osseous abnormality. Mesh in the low anterior pelvis suggest bilateral groin hernia repair. IMPRESSION: 1. No acute findings in the abdomen or  pelvis. Specifically, no findings to explain the patient's history of nausea and vomiting. 2. Bibasilar collapse/consolidation in the lungs. Pneumonia a concern. 3. Left colonic diverticulosis without diverticulitis. 4. Prostatomegaly. 5.  Aortic Atherosclerosis (ICD10-I70.0). Electronically Signed   By: Misty Stanley M.D.   On: 02/01/2023 20:12   DG Abd Portable 2V  Result Date: 01/31/2023 CLINICAL DATA:  Nausea and vomiting EXAM: PORTABLE ABDOMEN - 2 VIEW COMPARISON:  Chest radiograph 01/28/2023. FINDINGS: 2 supine views of the abdomen and pelvis. Marked right hemidiaphragm elevation. Bibasilar airspace disease. Motion degradation on the semi-erect view of the lower chest and upper abdomen. No gross free intraperitoneal air or significant air-fluid levels. Numerous leads and wires project over the chest and abdomen. Supine image of the lower abdomen and pelvis demonstrates no gaseous distention of bowel loops. Distal gas and stool. IMPRESSION: No acute process or explanation for patient's symptoms Electronically Signed   By: Abigail Miyamoto M.D.   On: 01/31/2023 12:49       LOS: 4 days   [facility] Pager on [email or web address]  02/02/2023, 8:52 AM

## 2023-02-02 NOTE — Progress Notes (Addendum)
Dr. [place] Pink rounding at bedside. Reviewed AM labs - BG of 67 with 0500 labs. Verbal orders for 1/2 amp of D50. Chest CT negative - plan to restart tube feedings today  Orders have been placed.  BG 107 after 1/2 amp D50

## 2023-02-02 NOTE — Progress Notes (Signed)
Physical Therapy Treatment and [place] Patient Details Name: Lee Barnes MRN: [id number] DOB: 02-16-1951 Today's Date: 02/02/2023   History of Present Illness Pt is a 72 y/o male presenting to [facility] 01/28/23 with recurrent pneumonia.PMH: aspiration PNA. , squamous cell carcinoma of L cheek, diverticulosis, childhood heart murmur, HTN, oropharyngeal cancer, tinnitis, hx of chemo and radiation, PEG tube placement,    PT Comments    Pt  progressing well, maintaining sats 89-94% on 2L. Gait steady and velocity WFL. No further PT needs at this time; mobility team to follow. Supervision only for line management   Recommendations for follow up therapy are one component of a multi-disciplinary discharge planning process, led by the attending physician.  Recommendations may be updated based on patient status, additional functional criteria and insurance authorization.  Follow Up Recommendations  No PT follow up     Assistance Recommended at Discharge PRN  Patient can return home with the following Assistance with cooking/housework   Equipment Recommendations  None recommended by PT    Recommendations for Other Services       Precautions / Restrictions Precautions Precautions: Other (comment) Precaution Comments: monitor  SPO2 , PEG Restrictions Weight Bearing Restrictions: No     Mobility  Bed Mobility Overal bed mobility: Independent             General bed mobility comments: HOB at 30 degrees    Transfers Overall transfer level: Needs assistance   Transfers: Sit to/from Stand Sit to Stand: Supervision           General transfer comment: for safety and line management    Ambulation/Gait Ambulation/Gait assistance: Supervision Gait Distance (Feet): 400 Feet Assistive device: None Gait Pattern/deviations: WFL(Within Functional Limits)       General Gait Details: no LOB; no device to IV pole push. SpO2=89-94% on 2L   Stairs             Wheelchair  Mobility    Modified Rankin (Stroke Patients Only)       Balance                                            Cognition Arousal/Alertness: Awake/alert Behavior During Therapy: WFL for tasks assessed/performed Overall Cognitive Status: Within Functional Limits for tasks assessed                                          Exercises      General Comments        Pertinent Vitals/Pain Pain Assessment Pain Assessment: No/denies pain Pain Intervention(s): Limited activity within patient's tolerance, Monitored during session, Premedicated before session, Repositioned    Home Living                          Prior Function            PT Goals (current goals can now be found in the care plan section) Acute Rehab PT Goals Patient Stated Goal: walk PT Goal Formulation: With patient Time For Goal Achievement: 02/13/23 Potential to Achieve Goals: Good Progress towards PT goals: Progressing toward goals    Frequency    Min 3X/week      PT Plan Current plan remains appropriate    Co-evaluation  AM-PAC PT "6 Clicks" Mobility   Outcome Measure  Help needed turning from your back to your side while in a flat bed without using bedrails?: None Help needed moving from lying on your back to sitting on the side of a flat bed without using bedrails?: None Help needed moving to and from a bed to a chair (including a wheelchair)?: None Help needed standing up from a chair using your arms (e.g., wheelchair or bedside chair)?: None Help needed to walk in hospital room?: None Help needed climbing 3-5 steps with a railing? : A Little 6 Click Score: 23    End of Session Equipment Utilized During Treatment: Oxygen Activity Tolerance: Patient tolerated treatment well Patient left: in chair;with call bell/phone within reach;with chair alarm set;with family/visitor present Nurse Communication: Mobility status PT Visit  Diagnosis: Difficulty in walking, not elsewhere classified (R26.2)     Time: 1541-1600 PT Time Calculation (min) (ACUTE ONLY): 19 min  Charges:  $Gait Training: 8-22 mins                     Baxter Flattery, PT  Acute Rehab Dept [facility]) [phone]  [facility] Weekend Pager ([place] only)  [phone]  02/02/2023    [facility] 02/02/2023, 4:06 PM

## 2023-02-03 ENCOUNTER — Other Ambulatory Visit: Payer: Medicare Other

## 2023-02-03 ENCOUNTER — Telehealth: Payer: Self-pay

## 2023-02-03 DIAGNOSIS — J690 Pneumonitis due to inhalation of food and vomit: Secondary | ICD-10-CM | POA: Diagnosis not present

## 2023-02-03 DIAGNOSIS — J9601 Acute respiratory failure with hypoxia: Secondary | ICD-10-CM | POA: Diagnosis not present

## 2023-02-03 LAB — GLUCOSE, CAPILLARY
Glucose-Capillary: 117 mg/dL — ABNORMAL HIGH (ref 70–99)
Glucose-Capillary: 123 mg/dL — ABNORMAL HIGH (ref 70–99)
Glucose-Capillary: 122 mg/dL — ABNORMAL HIGH (ref 70–99)
Glucose-Capillary: 133 mg/dL — ABNORMAL HIGH (ref 70–99)
Glucose-Capillary: 123 mg/dL — ABNORMAL HIGH (ref 70–99)

## 2023-02-03 LAB — BASIC METABOLIC PANEL
Anion gap: 10 (ref 5–15)
BUN: 26 mg/dL — ABNORMAL HIGH (ref 8–23)
CO2: 26 mmol/L (ref 22–32)
Calcium: 8.7 mg/dL — ABNORMAL LOW (ref 8.9–10.3)
Chloride: 95 mmol/L — ABNORMAL LOW (ref 98–111)
Creatinine, Ser: 0.85 mg/dL (ref 0.61–1.24)
GFR, Estimated: >60 mL/min (ref >60)
Glucose, Bld: 121 mg/dL — ABNORMAL HIGH (ref 70–99)
Potassium: 3.4 mmol/L — ABNORMAL LOW (ref 3.5–5.1)
Sodium: 131 mmol/L — ABNORMAL LOW (ref 135–145)

## 2023-02-03 LAB — PHOSPHORUS: Phosphorus: 2.9 mg/dL (ref 2.5–4.6)

## 2023-02-03 LAB — CBC
HCT: 32.4 % — ABNORMAL LOW (ref 39.0–52.0)
Hemoglobin: 10.4 g/dL — ABNORMAL LOW (ref 13.0–17.0)
MCH: 28.5 pg (ref 26.0–34.0)
MCHC: 32.1 g/dL (ref 30.0–36.0)
MCV: 88.8 fL (ref 80.0–100.0)
Platelets: 229 10*3/uL (ref 150–400)
RBC: 3.65 MIL/uL — ABNORMAL LOW (ref 4.22–5.81)
RDW: 15.6 % — ABNORMAL HIGH (ref 11.5–15.5)
WBC: 5.9 10*3/uL (ref 4.0–10.5)
nRBC: 0 % (ref 0.0–0.2)

## 2023-02-03 LAB — MAGNESIUM: Magnesium: 1.9 mg/dL (ref 1.7–2.4)

## 2023-02-03 MED ORDER — HYDRALAZINE HCL 25 MG PO TABS
25.0000 mg | ORAL_TABLET | Freq: Once | ORAL | Status: AC
  Administered 2023-02-03: 25 mg
  Filled 2023-02-03: qty 1

## 2023-02-03 MED ORDER — HYDRALAZINE HCL 25 MG PO TABS: 25.0000 mg | ORAL_TABLET | Freq: Once | ORAL | Status: DC

## 2023-02-03 MED ORDER — OSMOLITE 1.2 CAL PO LIQD
1000.0000 mL | ORAL | Status: DC
  Administered 2023-02-03 – 2023-02-04 (×2): 1000 mL
  Filled 2023-02-03 (×2): qty 1000

## 2023-02-03 MED ORDER — METOCLOPRAMIDE HCL 10 MG PO TABS
5.0000 mg | ORAL_TABLET | Freq: Three times a day (TID) | ORAL | Status: DC
  Administered 2023-02-03 – 2023-02-05 (×6): 5 mg
  Filled 2023-02-03 (×7): qty 1

## 2023-02-03 MED ORDER — POTASSIUM CHLORIDE 20 MEQ PO PACK
40.0000 meq | PACK | Freq: Once | ORAL | Status: AC
  Administered 2023-02-03: 40 meq
  Filled 2023-02-03: qty 2

## 2023-02-03 NOTE — Progress Notes (Signed)
[facility] PROGRESS NOTE   Lee Barnes [id number] DOB: 1950/12/05 DOA: 01/28/2023  PCP: Denita Lung, MD  Brief History/Interval Summary: 72 y.o. male with medical history significant for BCC and tongue base cancer who has a history of aspiration pneumonia last discharged from this hospital for the same about 1 month ago, he is feeding tube dependent and admitted to the hospital with recurrent aspiration pneumonia.  He was placed on high flow nasal cannula at 10 L/min.  Consultants: None  Procedures: None    Subjective/Interval History: Had loose stool this morning but not having frequent loose stools.  Denies any abdominal pain.  No further nausea and vomiting.  No chest pain.'s of breath has improved.     Assessment/Plan:  Aspiration pneumonia/acute respiratory failure with hypoxia This is probably related to his history of throat cancer. Patient was started on Unasyn.  He was initially requiring 10 L of oxygen by high flow nasal cannula.  Oxygen was weaned down.  He was doing better from a symptom standpoint as well.  He was transferred out of the ICU. On 3/15 patient had episodes of emesis following which he became hypoxic and tachycardic.  Oxygen had to be bumped up to 5 L/min.   Sputum cultures positive for E. coli.  Previous cultures have been positive for Enterobacter.  Resistance noted to Unasyn.  Patient was changed over to cefepime. Patient has been seen by ENT at [facility] and was offered laryngectomy but patient was hesitant.  He may need to revisit this.  He will need to be stable prior to considering surgery. Respiratory status has been stable.  Continue to wean down oxygen to maintain saturations greater than 90%.  He does not use oxygen at home. Continue cefepime for now.  Sputum culture results reviewed again.  Will discuss with infectious disease tomorrow.    Recurrent episodes of nausea and vomiting Reason for his recurrent nausea and vomiting  is not clear.  Abdominal film did not show any acute findings.  CT scan was done which also does not show any significant findings.   Has been tolerating his tube feedings.  Will change Reglan to be given through the G-tube.  Increase the rate of tube feedings.     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  He has had his G-tube for the last year and a half.   Tube feedings were resumed yesterday.  Will increase the rate today.  Elevated blood pressure No known history of hypertension.  Patient mentions that ever since his radiation treatment for his throat cancer several years ago his blood pressure fluctuates quite a bit.  Continue to monitor for now.  Hold off on any scheduled antihypertensives.    History of oropharyngeal cancer Stable.  Potassium abnormalities Initially presented with hyperkalemia.  Noted to have hypokalemia today.  This will be supplemented.  Check magnesium and phosphorus levels as well.    Hyponatremia and hypophosphatemia Continue to monitor electrolytes.    Normocytic anemia Drop in hemoglobin is dilutional.  No evidence of overt blood loss.  Continue to monitor.  Severe protein calorie malnutrition Nutrition Problem: Severe Malnutrition Etiology: chronic illness (BCC and tongue base cancer)  DVT Prophylaxis: Lovenox Code Status: Full code Family Communication: Discussed with the patient.  Wife was updated yesterday. Disposition Plan: Hopefully return home when improved.  Mobilize.  Status is: Inpatient Remains inpatient appropriate because: Aspiration pneumonia.  Acute respiratory failure with hypoxia.    Medications: Scheduled:  albuterol  2.5 mg Nebulization BID   enoxaparin (LOVENOX) injection  40 mg Subcutaneous Q24H   feeding supplement (PROSource TF20)  60 mL Per Tube Daily   ferrous sulfate  300 mg Per Tube Daily   fluticasone  2 spray Each Nare Daily   free water  100 mL Per Tube Q6H   metoCLOPramide  5 mg Per Tube TID    mouth rinse  15 mL Mouth Rinse 4 times per day   pantoprazole (PROTONIX) IV  40 mg Intravenous Daily   scopolamine  1 patch Transdermal Q72H   Continuous:  ceFEPime (MAXIPIME) IV 2 g (02/03/23 0623)   feeding supplement (OSMOLITE 1.2 CAL)     promethazine (PHENERGAN) injection (IM or IVPB) 12.5 mg (01/31/23 1521)   [id number] **OR** acetaminophen, albuterol, loperamide HCl, ondansetron (ZOFRAN) IV, mouth rinse, promethazine (PHENERGAN) injection (IM or IVPB), sodium chloride  Antibiotics: Anti-infectives (From admission, onward)    Start     Dose/Rate Route Frequency Ordered Stop   01/31/23 2000  ceFEPIme (MAXIPIME) 2 g in sodium chloride 0.9 % 100 mL IVPB        2 g 200 mL/hr over 30 Minutes Intravenous Every 8 hours 01/31/23 1827     01/29/23 0600  Ampicillin-Sulbactam (UNASYN) 3 g in sodium chloride 0.9 % 100 mL IVPB  Status:  Discontinued        3 g 200 mL/hr over 30 Minutes Intravenous Every 6 hours 01/29/23 0130 01/31/23 1824   01/28/23 2345  Ampicillin-Sulbactam (UNASYN) 3 g in sodium chloride 0.9 % 100 mL IVPB        3 g 200 mL/hr over 30 Minutes Intravenous  Once 01/28/23 2335 01/29/23 0111       Objective:  Vital Signs  Vitals:   02/02/23 2025 02/02/23 2215 02/03/23 0400 02/03/23 0412  BP: (!) 183/101 (!) 146/95    Pulse: (!) 101 98 87   Resp: 20 20 20    Temp: 98.7 F (37.1 C)  97.8 F (36.6 C)   TempSrc: Oral  Oral   SpO2: 95% 95% 97%   Weight:    74.3 kg  Height:        Intake/Output Summary (Last 24 hours) at 02/03/2023 0904 Last data filed at 02/03/2023 [id number] Gross per 24 hour  Intake 1320.43 ml  Output 1360 ml  Net -39.57 ml    Filed Weights   02/01/23 0429 02/02/23 0500 02/03/23 0412  Weight: 74.4 kg 74.4 kg 74.3 kg    General appearance: Awake alert.  In no distress Resp: Improved aeration bilaterally with few crackles bilateral bases.  No wheezing or rhonchi. Cardio: S1-S2 is normal regular.  No S3-S4.  No rubs murmurs or bruit GI:  Abdomen is soft.  Nontender nondistended.  Bowel sounds are present normal.  No masses organomegaly.  PEG tube is noted Extremities: No edema.  Full range of motion of lower extremities. Neurologic: Alert and oriented x3.  No focal neurological deficits.      Lab Results:  Data Reviewed: I have personally reviewed following labs and reports of the imaging studies  CBC: Recent Labs  Lab 01/28/23 2225 01/29/23 0310 01/30/23 0514 01/31/23 0600 02/01/23 0528 02/02/23 0503 02/03/23 0448  WBC 16.9* 7.9 15.1* 13.8* 10.0 8.3 5.9  NEUTROABS 15.8* 7.4  --   --   --   --   --   HGB 13.5 12.3* 10.7* 11.0* 11.3* 10.8* 10.4*  HCT 43.4 38.1* 33.9* 34.8* 36.8* 33.7* 32.4*  MCV 89.7 88.2 91.1 89.5 90.2  88.7 88.8  PLT 256 248 214 211 227 213 229     Basic Metabolic Panel: Recent Labs  Lab 01/29/23 0310 01/30/23 0514 01/30/23 1720 01/31/23 0600 01/31/23 1635 02/01/23 0528 02/02/23 0503 02/03/23 0448  NA 131* 133*  --  133*  --  133* 131* 131*  K 5.2* 4.8  --  4.6  --  4.6 4.2 3.4*  CL 96* 95*  --  94*  --  94* 96* 95*  CO2 25 28  --  28  --  29 25 26   GLUCOSE 133* 106*  --  88  --  87 67* 121*  BUN 31* 34*  --  30*  --  32* 28* 26*  CREATININE 1.02 0.96  --  0.85  --  1.08 1.05 0.85  CALCIUM 8.5* 9.0  --  9.1  --  9.2 8.8* 8.7*  MG 2.6* 2.2 2.2 1.8 1.9  --   --   --   PHOS 3.1 2.0* 3.4 3.3 3.5  --   --   --      GFR: Estimated Creatinine Clearance: 83.8 mL/min (by C-G formula based on SCr of 0.85 mg/dL).  Liver Function Tests: Recent Labs  Lab 01/28/23 2225 01/29/23 0310  AST 29 26  ALT 20 17  ALKPHOS 108 79  BILITOT 0.6 0.6  PROT 8.8* 7.2  ALBUMIN 4.3 3.6      Coagulation Profile: Recent Labs  Lab 01/28/23 2225  INR 1.1      CBG: Recent Labs  Lab 02/02/23 1616 02/02/23 2026 02/02/23 2344 02/03/23 0409 02/03/23 0739  GLUCAP 95 122* 122* 117* 123*      Recent Results (from the past 240 hour(s))  Blood Culture (routine x 2)     Status: None    Collection Time: 01/28/23 10:25 PM   Specimen: BLOOD RIGHT FOREARM  Result Value Ref Range Status   Specimen Description   Final    BLOOD RIGHT FOREARM Performed at [facility] [address] [place], [place] [address]    Special Requests   Final    BOTTLES DRAWN AEROBIC AND ANAEROBIC Blood Culture adequate volume Performed at [facility] [address] [place], [place] [address]    Culture   Final    NO GROWTH 5 DAYS Performed at [facility], [place] [address] [place], [place] [address]    Report Status 02/02/2023 FINAL  Final  Resp panel by RT-PCR (RSV, Flu A&B, Covid)     Status: None   Collection Time: 01/28/23 10:29 PM   Specimen: Nasal Swab  Result Value Ref Range Status   SARS Coronavirus 2 by RT PCR NEGATIVE NEGATIVE Final    Comment: (NOTE) SARS-CoV-2 target nucleic acids are NOT DETECTED.  The SARS-CoV-2 RNA is generally detectable in upper respiratory specimens during the acute phase of infection. The lowest concentration of SARS-CoV-2 viral copies this assay can detect is 138 copies/mL. A negative result does not preclude SARS-Cov-2 infection and should not be used as the sole basis for treatment or other patient management decisions. A negative result may occur with  improper specimen collection/handling, submission of specimen other than nasopharyngeal swab, presence of viral mutation(s) within the areas targeted by this assay, and inadequate number of viral copies(<138 copies/mL). A negative result must be combined with clinical observations, patient history, and epidemiological information. The expected result is Negative.  Fact Sheet for Patients:  EntrepreneurPulse.com.au  Fact Sheet for Healthcare Providers:  IncredibleEmployment.be  This test is no t yet approved or cleared by the [place] and  has been authorized for detection and/or diagnosis of SARS-CoV-2  by FDA under an Emergency Use Authorization (EUA). This EUA will remain  in effect (meaning this test can be used) for the duration of the COVID-19 declaration under Section 564(b)(1) of the Act, 21 U.S.C.section 360bbb-3(b)(1), unless the authorization is terminated  or revoked sooner.       Influenza A by PCR NEGATIVE NEGATIVE Final   Influenza B by PCR NEGATIVE NEGATIVE Final    Comment: (NOTE) The Xpert Xpress SARS-CoV-2/FLU/RSV plus assay is intended as an aid in the diagnosis of influenza from Nasopharyngeal swab specimens and should not be used as a sole basis for treatment. Nasal washings and aspirates are unacceptable for Xpert Xpress SARS-CoV-2/FLU/RSV testing.  Fact Sheet for Patients: EntrepreneurPulse.com.au  Fact Sheet for Healthcare Providers: IncredibleEmployment.be  This test is not yet approved or cleared by the [place] FDA and has been authorized for detection and/or diagnosis of SARS-CoV-2 by FDA under an Emergency Use Authorization (EUA). This EUA will remain in effect (meaning this test can be used) for the duration of the COVID-19 declaration under Section 564(b)(1) of the Act, 21 U.S.C. section 360bbb-3(b)(1), unless the authorization is terminated or revoked.     Resp Syncytial Virus by PCR NEGATIVE NEGATIVE Final    Comment: (NOTE) Fact Sheet for Patients: EntrepreneurPulse.com.au  Fact Sheet for Healthcare Providers: IncredibleEmployment.be  This test is not yet approved or cleared by the [place] FDA and has been authorized for detection and/or diagnosis of SARS-CoV-2 by FDA under an Emergency Use Authorization (EUA). This EUA will remain in effect (meaning this test can be used) for the duration of the COVID-19 declaration under Section 564(b)(1) of the Act, 21 U.S.C. section 360bbb-3(b)(1), unless the authorization is terminated or revoked.  Performed at  [facility], [place] [address], [place], [place] [address]   Blood Culture (routine x 2)     Status: None   Collection Time: 01/28/23 10:45 PM   Specimen: BLOOD  Result Value Ref Range Status   Specimen Description   Final    BLOOD BLOOD RIGHT WRIST Performed at [facility] [address], [place], [place] [address]    Special Requests   Final    BOTTLES DRAWN AEROBIC AND ANAEROBIC Blood Culture results may not be optimal due to an inadequate volume of blood received in culture bottles Performed at [facility] [address] [place], [place] [address]    Culture   Final    NO GROWTH 5 DAYS Performed at [facility], [place] [address] [place], [place] [address]    Report Status 02/02/2023 FINAL  Final  Expectorated Sputum Assessment w Gram Stain, Rflx to Resp Cult     Status: None   Collection Time: 01/29/23  4:20 AM   Specimen: Expectorated Sputum  Result Value Ref Range Status   Specimen Description EXPECTORATED SPUTUM  Final   Special Requests NONE  Final   Sputum evaluation   Final    THIS SPECIMEN IS ACCEPTABLE FOR SPUTUM CULTURE Performed at [facility], [place] [address], [place], [place] [address]    Report Status 01/29/2023 FINAL  Final  Culture, Respiratory w Gram Stain     Status: None   Collection Time: 01/29/23  4:20 AM  Result Value Ref Range Status   Specimen Description   Final  EXPECTORATED SPUTUM Performed at [facility], [place] [address], [place], [place] [address]    Special Requests   Final    NONE Reflexed from [id number] Performed at [facility] [address], [place], [place] [address]    Gram Stain   Final    NO WBC SEEN ABUNDANT GRAM NEGATIVE RODS FEW BUDDING YEAST SEEN RARE GRAM POSITIVE COCCI IN CLUSTERS    Culture   Final    ABUNDANT ESCHERICHIA COLI NO STAPHYLOCOCCUS AUREUS ISOLATED No Pseudomonas species  isolated Performed at [facility], [address] [place], [place] [address]    Report Status 01/31/2023 FINAL  Final   Organism ID, Bacteria ESCHERICHIA COLI  Final      Susceptibility   Escherichia coli - MIC*    AMPICILLIN >=32 RESISTANT Resistant     CEFEPIME <=0.12 SENSITIVE Sensitive     CEFTAZIDIME <=1 SENSITIVE Sensitive     CEFTRIAXONE 1 SENSITIVE Sensitive     CIPROFLOXACIN <=0.25 SENSITIVE Sensitive     GENTAMICIN <=1 SENSITIVE Sensitive     IMIPENEM <=0.25 SENSITIVE Sensitive     TRIMETH/SULFA <=20 SENSITIVE Sensitive     AMPICILLIN/SULBACTAM >=32 RESISTANT Resistant     PIP/TAZO 8 SENSITIVE Sensitive     * ABUNDANT ESCHERICHIA COLI  MRSA Next Gen by PCR, Nasal     Status: None   Collection Time: 01/29/23  4:54 AM   Specimen: Nasal Mucosa; Nasal Swab  Result Value Ref Range Status   MRSA by PCR Next Gen NOT DETECTED NOT DETECTED Final    Comment: (NOTE) The GeneXpert MRSA Assay (FDA approved for NASAL specimens only), is one component of a comprehensive MRSA colonization surveillance program. It is not intended to diagnose MRSA infection nor to guide or monitor treatment for MRSA infections. Test performance is not FDA approved in patients less than 52 years old. Performed at [facility], [place] [address] [place], [place] [address]       Radiology Studies: CT ABDOMEN PELVIS W CONTRAST  Result Date: 02/01/2023 CLINICAL DATA:  Recurrent nausea and vomiting. EXAM: CT ABDOMEN AND PELVIS WITH CONTRAST TECHNIQUE: Multidetector CT imaging of the abdomen and pelvis was performed using the standard protocol following bolus administration of intravenous contrast. RADIATION DOSE REDUCTION: This exam was performed according to the departmental dose-optimization program which includes automated exposure control, adjustment of the mA and/or kV according to patient size and/or use of iterative reconstruction technique. CONTRAST:  187mL OMNIPAQUE IOHEXOL  300 MG/ML  SOLN COMPARISON:  05/23/2015 FINDINGS: Lower chest: Patchy airspace disease noted left lower lobe potentially atelectasis although pneumonia a concern. Collapse/consolidation in the right lower lobe is only seen on renal delay imaging as this portion of the chest was not included on the initial study. Hepatobiliary: No suspicious focal abnormality within the liver parenchyma. There is no evidence for gallstones, gallbladder wall thickening, or pericholecystic fluid. No intrahepatic or extrahepatic biliary dilation. Pancreas: No focal mass lesion. No dilatation of the main duct. No intraparenchymal cyst. No peripancreatic edema. Spleen: No splenomegaly. No focal mass lesion. Adrenals/Urinary Tract: No adrenal nodule or mass. Cortical scarring noted lower pole right kidney. 2.3 cm simple cyst in the interpolar right kidney was present previously. No followup imaging is recommended. Tiny hypoattenuating lesions in the left kidney are too small to characterize but likely benign. No followup imaging is recommended. No evidence for hydroureter. The urinary bladder appears normal for the degree of distention. Stomach/Bowel: Stomach is unremarkable. No gastric  wall thickening. No evidence of outlet obstruction. Gastrostomy tube noted with tip in the gastric lumen. Duodenum is normally positioned as is the ligament of Treitz. Duodenal diverticulum noted. No small bowel wall thickening. No small bowel dilatation. The terminal ileum is normal. The appendix is not well visualized, but there is no edema or inflammation in the region of the cecum. No gross colonic mass. No colonic wall thickening. Diverticular changes are noted in the left colon without evidence of diverticulitis. Vascular/Lymphatic: There is moderate atherosclerotic calcification of the abdominal aorta without aneurysm. Small penetrating ulcer or ulcerated plaque noted infrarenal abdominal aorta. There is no gastrohepatic or hepatoduodenal ligament  lymphadenopathy. No retroperitoneal or mesenteric lymphadenopathy. No pelvic sidewall lymphadenopathy. Reproductive: Prostate gland is enlarged. Other: No intraperitoneal free fluid. Musculoskeletal: No worrisome lytic or sclerotic osseous abnormality. Mesh in the low anterior pelvis suggest bilateral groin hernia repair. IMPRESSION: 1. No acute findings in the abdomen or pelvis. Specifically, no findings to explain the patient's history of nausea and vomiting. 2. Bibasilar collapse/consolidation in the lungs. Pneumonia a concern. 3. Left colonic diverticulosis without diverticulitis. 4. Prostatomegaly. 5.  Aortic Atherosclerosis (ICD10-I70.0). Electronically Signed   By: Misty Stanley M.D.   On: 02/01/2023 20:12       LOS: 5 days   [facility] Pager on [email or web address]  02/03/2023, 9:04 AM

## 2023-02-03 NOTE — Progress Notes (Signed)
Received report from off going RN. Agree with previous RN assessment. Pt comfortable in room with no needs at this time.

## 2023-02-03 NOTE — Progress Notes (Signed)
Picked up patient at 1500. Received report from Peggyann Shoals, RN at 1500. Agree with patient's most current assessment. RN will resume care of patient until 0300 on 02/04/23. Will continue to monitor and care for patient until 0300 on 02/04/23. Patient has no needs at this time.

## 2023-02-03 NOTE — Telephone Encounter (Signed)
Palliative Care Telephone Encounter Note   12:14pm - return TC to Caynen Manahan (spouse). Explained to [name] that when pt is discharged from the hospital she will call to let [place] know he is at home. LPN will call her to make an appt for a home visit. [name] voiced understanding and agreement.  Lorelee Market, LPN Palliative Care Nurse [phone]

## 2023-02-04 ENCOUNTER — Inpatient Hospital Stay (HOSPITAL_COMMUNITY): Payer: Medicare Other

## 2023-02-04 DIAGNOSIS — J690 Pneumonitis due to inhalation of food and vomit: Secondary | ICD-10-CM | POA: Diagnosis not present

## 2023-02-04 DIAGNOSIS — J9601 Acute respiratory failure with hypoxia: Secondary | ICD-10-CM | POA: Diagnosis not present

## 2023-02-04 LAB — CBC
HCT: 33.3 % — ABNORMAL LOW (ref 39.0–52.0)
Hemoglobin: 10.7 g/dL — ABNORMAL LOW (ref 13.0–17.0)
MCH: 28.2 pg (ref 26.0–34.0)
MCHC: 32.1 g/dL (ref 30.0–36.0)
MCV: 87.9 fL (ref 80.0–100.0)
Platelets: 223 10*3/uL (ref 150–400)
RBC: 3.79 MIL/uL — ABNORMAL LOW (ref 4.22–5.81)
RDW: 15.7 % — ABNORMAL HIGH (ref 11.5–15.5)
WBC: 4.4 10*3/uL (ref 4.0–10.5)
nRBC: 0 % (ref 0.0–0.2)

## 2023-02-04 LAB — BASIC METABOLIC PANEL
Anion gap: 10 (ref 5–15)
BUN: 23 mg/dL (ref 8–23)
CO2: 25 mmol/L (ref 22–32)
Calcium: 8.9 mg/dL (ref 8.9–10.3)
Chloride: 95 mmol/L — ABNORMAL LOW (ref 98–111)
Creatinine, Ser: 0.83 mg/dL (ref 0.61–1.24)
GFR, Estimated: >60 mL/min (ref >60)
Glucose, Bld: 118 mg/dL — ABNORMAL HIGH (ref 70–99)
Potassium: 4.1 mmol/L (ref 3.5–5.1)
Sodium: 130 mmol/L — ABNORMAL LOW (ref 135–145)

## 2023-02-04 LAB — GLUCOSE, CAPILLARY
Glucose-Capillary: 103 mg/dL — ABNORMAL HIGH (ref 70–99)
Glucose-Capillary: 117 mg/dL — ABNORMAL HIGH (ref 70–99)
Glucose-Capillary: 117 mg/dL — ABNORMAL HIGH (ref 70–99)
Glucose-Capillary: 124 mg/dL — ABNORMAL HIGH (ref 70–99)
Glucose-Capillary: 124 mg/dL — ABNORMAL HIGH (ref 70–99)
Glucose-Capillary: 133 mg/dL — ABNORMAL HIGH (ref 70–99)
Glucose-Capillary: 92 mg/dL (ref 70–99)

## 2023-02-04 MED ORDER — SULFAMETHOXAZOLE-TRIMETHOPRIM 200-40 MG/5ML PO SUSP
20.0000 mL | Freq: Two times a day (BID) | ORAL | Status: DC
  Administered 2023-02-04 – 2023-02-05 (×3): 20 mL
  Filled 2023-02-04 (×3): qty 20

## 2023-02-04 MED ORDER — HYDRALAZINE HCL 25 MG PO TABS
25.0000 mg | ORAL_TABLET | Freq: Three times a day (TID) | ORAL | Status: DC | PRN
  Administered 2023-02-04: 25 mg
  Filled 2023-02-04: qty 1

## 2023-02-04 MED ORDER — SODIUM CHLORIDE 3 % IN NEBU
4.0000 mL | INHALATION_SOLUTION | Freq: Every day | RESPIRATORY_TRACT | Status: DC
  Administered 2023-02-05: 4 mL via RESPIRATORY_TRACT
  Filled 2023-02-04 (×2): qty 4

## 2023-02-04 MED ORDER — OSMOLITE 1.5 CAL PO LIQD
1275.0000 mL | ORAL | Status: DC
  Administered 2023-02-04: 1275 mL
  Administered 2023-02-05: 1000 mL
  Filled 2023-02-04 (×2): qty 1422

## 2023-02-04 MED ORDER — PROSOURCE TF20 ENFIT COMPATIBL EN LIQD
60.0000 mL | Freq: Two times a day (BID) | ENTERAL | Status: DC
  Administered 2023-02-04 – 2023-02-05 (×2): 60 mL
  Filled 2023-02-04 (×2): qty 60

## 2023-02-04 NOTE — Progress Notes (Signed)
[facility] PROGRESS NOTE   Lee Barnes [id number] DOB: 01-Apr-1951 DOA: 01/28/2023  PCP: Denita Lung, MD  Brief History/Interval Summary: 72 y.o. male with medical history significant for BCC and tongue base cancer who has a history of aspiration pneumonia last discharged from this hospital for the same about 1 month ago, he is feeding tube dependent and admitted to the hospital with recurrent aspiration pneumonia.  He was placed on high flow nasal cannula at 10 L/min.  Consultants: None  Procedures: None    Subjective/Interval History: Had about 4-5 loose bowel movements yesterday.  Denies any abdominal pain nausea or vomiting.  Shortness of breath is improved.      Assessment/Plan:  Aspiration pneumonia/acute respiratory failure with hypoxia This is probably related to his history of throat cancer. Patient was started on Unasyn.  He was initially requiring 10 L of oxygen by high flow nasal cannula.  Oxygen was weaned down.  He was doing better from a symptom standpoint as well.  He was transferred out of the ICU. On 3/15 patient had episodes of emesis following which he became hypoxic and tachycardic.  Oxygen had to be bumped up to 5 L/min.   Sputum cultures positive for E. coli.  Previous cultures have been positive for Enterobacter.  Resistance noted to Unasyn.  Patient was changed over to cefepime. Patient has been seen by ENT at [facility] and was offered laryngectomy but patient was hesitant.  He may need to revisit this.  He will need to be stable prior to considering surgery. Respiratory status has been stable. His oxygen saturation dropped into the 80s when he was taken off of oxygen this morning.  Currently on 2 L.   May need to go home with oxygen.  Check ambulatory pulse oximetry. Patient's respiratory status has been stable for the last 3 days.  WBC is normal.  He is afebrile.  Will change him to Bactrim based on sensitivities noted during this admission  as well as previous admission.   Chest x-ray from this morning shows improvement in aeration.  Elevated right diaphragm noted which was seen previously as well.  Recurrent episodes of nausea and vomiting Reason for his recurrent nausea and vomiting is not clear.  Abdominal film did not show any acute findings.  CT scan was done which also does not show any significant findings.   Patient was started on Reglan.  Has not had any further nausea vomiting.  However he is experiencing loose bowel movements.  Will continue to monitor for now without any changes.  Avoid Imodium as much as possible.  If if he continues to have frequent bowel movements then may need to cut back on the dose of the Reglan to twice a day.  He was told that the goal is to avoid further episodes of nausea vomiting which can cause a severe aspiration pneumonia with respiratory distress which is life-threatening as opposed to having diarrhea few times a day.    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  He has had his G-tube for the last year and a half.   Feedings were resumed.  Will request dietitian to try and replicate his tube feeding regimen that he uses at home to see how he does over the next 24 hours on it.  Elevated blood pressure No known history of hypertension.  Patient mentions that ever since his radiation treatment for his throat cancer several years ago his blood pressure fluctuates quite a bit.  Continue  to monitor.  Use hydralazine orally as needed.  History of oropharyngeal cancer Stable.  Potassium abnormalities Initially presented with hyperkalemia.  Subsequently had a hypokalemia.  Improved this morning.  Continue to monitor.  Magnesium and phosphorus levels are normal.    Hyponatremia and hypophosphatemia Continue to monitor electrolytes.    Normocytic anemia Drop in hemoglobin is dilutional.  No evidence of overt blood loss.  Continue to monitor.  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 Disposition Plan: Anticipate discharge in 24 to 48 hours.  May need home oxygen.  Status is: Inpatient Remains inpatient appropriate because: Aspiration pneumonia.  Acute respiratory failure with hypoxia.    Medications: Scheduled:  albuterol  2.5 mg Nebulization BID   enoxaparin (LOVENOX) injection  40 mg Subcutaneous Q24H   feeding supplement (PROSource TF20)  60 mL Per Tube Daily   ferrous sulfate  300 mg Per Tube Daily   fluticasone  2 spray Each Nare Daily   free water  100 mL Per Tube Q6H   metoCLOPramide  5 mg Per Tube TID   mouth rinse  15 mL Mouth Rinse 4 times per day   pantoprazole (PROTONIX) IV  40 mg Intravenous Daily   scopolamine  1 patch Transdermal Q72H   sulfamethoxazole-trimethoprim  1 tablet Per Tube Q12H   Continuous:  feeding supplement (OSMOLITE 1.2 CAL) 1,000 mL (02/04/23 0600)   promethazine (PHENERGAN) injection (IM or IVPB) 12.5 mg (01/31/23 1521)   [id number] **OR** acetaminophen, albuterol, loperamide HCl, ondansetron (ZOFRAN) IV, mouth rinse, promethazine (PHENERGAN) injection (IM or IVPB), sodium chloride  Antibiotics: Anti-infectives (From admission, onward)    Start     Dose/Rate Route Frequency Ordered Stop   02/04/23 1015  sulfamethoxazole-trimethoprim (BACTRIM DS) 800-160 MG per tablet 1 tablet        1 tablet Per Tube Every 12 hours 02/04/23 0921     01/31/23 2000  ceFEPIme (MAXIPIME) 2 g in sodium chloride 0.9 % 100 mL IVPB  Status:  Discontinued        2 g 200 mL/hr over 30 Minutes Intravenous Every 8 hours 01/31/23 1827 02/04/23 0921   01/29/23 0600  Ampicillin-Sulbactam (UNASYN) 3 g in sodium chloride 0.9 % 100 mL IVPB  Status:  Discontinued        3 g 200 mL/hr over 30 Minutes Intravenous Every 6 hours 01/29/23  0130 01/31/23 1824   01/28/23 2345  Ampicillin-Sulbactam (UNASYN) 3 g in sodium chloride 0.9 % 100 mL IVPB        3 g 200 mL/hr over 30 Minutes Intravenous  Once 01/28/23 2335 01/29/23 0111       Objective:  Vital Signs  Vitals:   02/03/23 2240 02/04/23 0423 02/04/23 0600 02/04/23 0747  BP: (!) 169/99 (!) 170/99    Pulse: 91 86    Resp:  20    Temp:  97.8 F (36.6 C)    TempSrc:  Oral    SpO2:  97%  92%  Weight:   73.4 kg   Height:        Intake/Output Summary (Last 24 hours) at 02/04/2023 0921 Last data filed at 02/04/2023 0836 Gross per 24 hour  Intake 1730.57 ml  Output 400 ml  Net 1330.57 ml    Filed Weights   02/02/23 0500 02/03/23 0412 02/04/23 0600  Weight: 74.4 kg 74.3 kg 73.4 kg  General appearance: Awake alert.  In no distress Resp: Coarse breath sounds bilaterally with few crackles at the bases. Cardio: S1-S2 is normal regular.  No S3-S4.  No rubs murmurs or bruit GI: Abdomen is soft.  Nontender nondistended.  Bowel sounds are present normal.  No masses organomegaly Extremities: No edema.  Full range of motion of lower extremities. Neurologic: Alert and oriented x3.  No focal neurological deficits.     Lab Results:  Data Reviewed: I have personally reviewed following labs and reports of the imaging studies  CBC: Recent Labs  Lab 01/28/23 2225 01/29/23 0310 01/30/23 0514 01/31/23 0600 02/01/23 0528 02/02/23 0503 02/03/23 0448 02/04/23 0546  WBC 16.9* 7.9   < > 13.8* 10.0 8.3 5.9 4.4  NEUTROABS 15.8* 7.4  --   --   --   --   --   --   HGB 13.5 12.3*   < > 11.0* 11.3* 10.8* 10.4* 10.7*  HCT 43.4 38.1*   < > 34.8* 36.8* 33.7* 32.4* 33.3*  MCV 89.7 88.2   < > 89.5 90.2 88.7 88.8 87.9  PLT 256 248   < > 211 227 213 229 223   < > = values in this interval not displayed.     Basic Metabolic Panel: Recent Labs  Lab 01/30/23 0514 01/30/23 1720 01/31/23 0600 01/31/23 1635 02/01/23 0528 02/02/23 0503 02/03/23 0448 02/04/23 0546  NA  133*  --  133*  --  133* 131* 131* 130*  K 4.8  --  4.6  --  4.6 4.2 3.4* 4.1  CL 95*  --  94*  --  94* 96* 95* 95*  CO2 28  --  28  --  29 25 26 25   GLUCOSE 106*  --  88  --  87 67* 121* 118*  BUN 34*  --  30*  --  32* 28* 26* 23  CREATININE 0.96  --  0.85  --  1.08 1.05 0.85 0.83  CALCIUM 9.0  --  9.1  --  9.2 8.8* 8.7* 8.9  MG 2.2 2.2 1.8 1.9  --   --  1.9  --   PHOS 2.0* 3.4 3.3 3.5  --   --  2.9  --      GFR: Estimated Creatinine Clearance: 84.7 mL/min (by C-G formula based on SCr of 0.83 mg/dL).  Liver Function Tests: Recent Labs  Lab 01/28/23 2225 01/29/23 0310  AST 29 26  ALT 20 17  ALKPHOS 108 79  BILITOT 0.6 0.6  PROT 8.8* 7.2  ALBUMIN 4.3 3.6      Coagulation Profile: Recent Labs  Lab 01/28/23 2225  INR 1.1      CBG: Recent Labs  Lab 02/03/23 1555 02/03/23 2033 02/04/23 0009 02/04/23 0420 02/04/23 0733  GLUCAP 133* 123* 124* 124* 117*      Recent Results (from the past 240 hour(s))  Blood Culture (routine x 2)     Status: None   Collection Time: 01/28/23 10:25 PM   Specimen: BLOOD RIGHT FOREARM  Result Value Ref Range Status   Specimen Description   Final    BLOOD RIGHT FOREARM Performed at [facility] [address], [place], [place] [address]    Special Requests   Final    BOTTLES DRAWN AEROBIC AND ANAEROBIC Blood Culture adequate volume Performed at [facility] [address] [place], [place] [address]    Culture   Final    NO GROWTH 5 DAYS Performed at  [facility], [place] [address] [place], [place] [address]    Report Status 02/02/2023 FINAL  Final  Resp panel by RT-PCR (RSV, Flu A&B, Covid)     Status: None   Collection Time: 01/28/23 10:29 PM   Specimen: Nasal Swab  Result Value Ref Range Status   SARS Coronavirus 2 by RT PCR NEGATIVE NEGATIVE Final    Comment: (NOTE) SARS-CoV-2 target nucleic acids are NOT DETECTED.  The SARS-CoV-2 RNA is generally detectable in upper  respiratory specimens during the acute phase of infection. The lowest concentration of SARS-CoV-2 viral copies this assay can detect is 138 copies/mL. A negative result does not preclude SARS-Cov-2 infection and should not be used as the sole basis for treatment or other patient management decisions. A negative result may occur with  improper specimen collection/handling, submission of specimen other than nasopharyngeal swab, presence of viral mutation(s) within the areas targeted by this assay, and inadequate number of viral copies(<138 copies/mL). A negative result must be combined with clinical observations, patient history, and epidemiological information. The expected result is Negative.  Fact Sheet for Patients:  EntrepreneurPulse.com.au  Fact Sheet for Healthcare Providers:  IncredibleEmployment.be  This test is no t yet approved or cleared by the [place] FDA and  has been authorized for detection and/or diagnosis of SARS-CoV-2 by FDA under an Emergency Use Authorization (EUA). This EUA will remain  in effect (meaning this test can be used) for the duration of the COVID-19 declaration under Section 564(b)(1) of the Act, 21 U.S.C.section 360bbb-3(b)(1), unless the authorization is terminated  or revoked sooner.       Influenza A by PCR NEGATIVE NEGATIVE Final   Influenza B by PCR NEGATIVE NEGATIVE Final    Comment: (NOTE) The Xpert Xpress SARS-CoV-2/FLU/RSV plus assay is intended as an aid in the diagnosis of influenza from Nasopharyngeal swab specimens and should not be used as a sole basis for treatment. Nasal washings and aspirates are unacceptable for Xpert Xpress SARS-CoV-2/FLU/RSV testing.  Fact Sheet for Patients: EntrepreneurPulse.com.au  Fact Sheet for Healthcare Providers: IncredibleEmployment.be  This test is not yet approved or cleared by the [place] FDA and has been  authorized for detection and/or diagnosis of SARS-CoV-2 by FDA under an Emergency Use Authorization (EUA). This EUA will remain in effect (meaning this test can be used) for the duration of the COVID-19 declaration under Section 564(b)(1) of the Act, 21 U.S.C. section 360bbb-3(b)(1), unless the authorization is terminated or revoked.     Resp Syncytial Virus by PCR NEGATIVE NEGATIVE Final    Comment: (NOTE) Fact Sheet for Patients: EntrepreneurPulse.com.au  Fact Sheet for Healthcare Providers: IncredibleEmployment.be  This test is not yet approved or cleared by the [place] FDA and has been authorized for detection and/or diagnosis of SARS-CoV-2 by FDA under an Emergency Use Authorization (EUA). This EUA will remain in effect (meaning this test can be used) for the duration of the COVID-19 declaration under Section 564(b)(1) of the Act, 21 U.S.C. section 360bbb-3(b)(1), unless the authorization is terminated or revoked.  Performed at [facility], [place] [address] [place], [place] [address]   Blood Culture (routine x 2)     Status: None   Collection Time: 01/28/23 10:45 PM   Specimen: BLOOD  Result Value Ref Range Status   Specimen Description   Final    BLOOD BLOOD RIGHT WRIST Performed at [facility] [address], [place], [place] [address]    Special Requests   Final  BOTTLES DRAWN AEROBIC AND ANAEROBIC Blood Culture results may not be optimal due to an inadequate volume of blood received in culture bottles Performed at [facility], [place] [address] [place], [place] [address]    Culture   Final    NO GROWTH 5 DAYS Performed at [facility], [place] [address], [place], [place] [address]    Report Status 02/02/2023 FINAL  Final  Expectorated Sputum Assessment w Gram Stain, Rflx to Resp Cult     Status: None   Collection Time: 01/29/23  4:20 AM   Specimen:  Expectorated Sputum  Result Value Ref Range Status   Specimen Description EXPECTORATED SPUTUM  Final   Special Requests NONE  Final   Sputum evaluation   Final    THIS SPECIMEN IS ACCEPTABLE FOR SPUTUM CULTURE Performed at [facility], [place] [address] [place], [place] [address]    Report Status 01/29/2023 FINAL  Final  Culture, Respiratory w Gram Stain     Status: None   Collection Time: 01/29/23  4:20 AM  Result Value Ref Range Status   Specimen Description   Final    EXPECTORATED SPUTUM Performed at [facility] [address] [place], [place] [address]    Special Requests   Final    NONE Reflexed from [id number] Performed at [facility] [address], [place], [place] [address]    Gram Stain   Final    NO WBC SEEN ABUNDANT GRAM NEGATIVE RODS FEW BUDDING YEAST SEEN RARE GRAM POSITIVE COCCI IN CLUSTERS    Culture   Final    ABUNDANT ESCHERICHIA COLI NO STAPHYLOCOCCUS AUREUS ISOLATED No Pseudomonas species isolated Performed at [facility], [address], [place], [place] [address]    Report Status 01/31/2023 FINAL  Final   Organism ID, Bacteria ESCHERICHIA COLI  Final      Susceptibility   Escherichia coli - MIC*    AMPICILLIN >=32 RESISTANT Resistant     CEFEPIME <=0.12 SENSITIVE Sensitive     CEFTAZIDIME <=1 SENSITIVE Sensitive     CEFTRIAXONE 1 SENSITIVE Sensitive     CIPROFLOXACIN <=0.25 SENSITIVE Sensitive     GENTAMICIN <=1 SENSITIVE Sensitive     IMIPENEM <=0.25 SENSITIVE Sensitive     TRIMETH/SULFA <=20 SENSITIVE Sensitive     AMPICILLIN/SULBACTAM >=32 RESISTANT Resistant     PIP/TAZO 8 SENSITIVE Sensitive     * ABUNDANT ESCHERICHIA COLI  MRSA Next Gen by PCR, Nasal     Status: None   Collection Time: 01/29/23  4:54 AM   Specimen: Nasal Mucosa; Nasal Swab  Result Value Ref Range Status   MRSA by PCR Next Gen NOT DETECTED NOT DETECTED Final    Comment: (NOTE) The GeneXpert MRSA Assay (FDA  approved for NASAL specimens only), is one component of a comprehensive MRSA colonization surveillance program. It is not intended to diagnose MRSA infection nor to guide or monitor treatment for MRSA infections. Test performance is not FDA approved in patients less than 43 years old. Performed at [facility], [place] [address], [place], [place] [address]       Radiology Studies: DG CHEST PORT 1 VIEW  Result Date: 02/04/2023 CLINICAL DATA:  Pneumonia EXAM: PORTABLE CHEST 1 VIEW COMPARISON:  01/28/2023 FINDINGS: Elevated right hemidiaphragm. Linear subsegmental atelectasis right mid lung. No pneumothorax. Unremarkable cardiac silhouette, partially obscured. IMPRESSION: Pulmonary vascular congestion. Elevated right hemidiaphragm and right perihilar subsegmental atelectasis. Electronically Signed   By: Vonna Kotyk  Pleasure M.D.   On: 02/04/2023 06:53       LOS: 6 days   [facility] Pager on [email or web address]  02/04/2023, 9:21 AM

## 2023-02-04 NOTE — Care Management Important Message (Signed)
Important Message  Patient Details IM Letter given. Name: Lee Barnes MRN: [id number] Date of Birth: 1951/10/12   Medicare Important Message Given:  Yes     Kerin Salen 02/04/2023, 11:43 AM

## 2023-02-04 NOTE — Progress Notes (Signed)
Notified on call provider that patient's BP was was 186/117 at 2036. RN had given patient a one time dose of hydralazine earlier at 1628, which had helped. On call provider did not give any new orders. On call provider told RN to monitor for now. Patient's BP was rechecked later at 2240 and it was 169/99.

## 2023-02-04 NOTE — Progress Notes (Addendum)
Made MD aware that patient's BP is 175/116. Prior to BP was 173/103. Patient was not in any pain or distress. MD put in an order for one time dose of hydralazine 25 mg.

## 2023-02-04 NOTE — Progress Notes (Signed)
Nutrition Follow-up  DOCUMENTATION CODES:   Severe malnutrition in context of chronic illness  INTERVENTION:   Transition to home regimen today: -Initiate Osmolite 1.5 @ 60 ml/hr, advance by 10 ml every 4 hours to goal rate of 85 ml/hr x 15 hours. Stop at 2100 today.  -Provide 60 ml Prosource TF 20 BID -Continue free water: 100 ml every 6 hours (400 ml)  3/20: Run Osmolite 1.5 @ 85 ml/hr x 15 hours (0600-2100) -60 ml Prosource TF 20 BID -Free water recommendation: 150 ml every 6 hours (600 ml) -Provides at goal: 2072 kcals, 119g protein and 1571 ml H2O  -Elevated bed during visit. Recommend pt HOB remain elevated above 30 degrees.  -If symptoms persist, consider PEG-J conversion.  NUTRITION DIAGNOSIS:   Severe Malnutrition related to chronic illness (BCC and tongue base cancer) as evidenced by severe muscle depletion, severe fat depletion.  Ongoing.  GOAL:   Patient will meet greater than or equal to 90% of their needs  Progressing with TF  MONITOR:   TF tolerance, Weight trends, Labs  REASON FOR ASSESSMENT:   Consult Enteral/tube feeding initiation and management  ASSESSMENT:   72 y.o. male with medical history significant for BCC and tongue base cancer who has a history of aspiration pneumonia last discharged from this hospital for the same about 1 month ago, his feeding tube dependent and admitted to the hospital with recurrent aspiration pneumonia.  Patient's tube feeding was restarted 3/17. In room was receiving Osmolite 1.2 @ 50 ml/hr. Per RN, pt had just reached 50 ml/hr. Tolerating. Noted patient's HOB was at 27 degrees, increased to 31 degrees. Encouraged pt to remain upright when receiving feeds.   Pt reported home regimen of Osmolite 1.1 (meant 1.5) at 85 ml/hr from 6am to 7:30-8am. Wife then provides breathing treatment, meds, free water and Prosource. Feeds then begin at 110 ml/hr until 8pm.   Would like to keep rate below 100 ml/hr given risk of  aspiration. Recommend continue at 85 ml/hr for 15 hours and add additional Prosource at night to meet protein and kcal needs.  Admission weight: 166 lbs Current weight: 161 lbs  Medications: Ferrous sulfate, Reglan  Labs reviewed:  CBGs: 117-124 Low Na   Diet Order:   Diet Order     None       EDUCATION NEEDS:   Education needs have been addressed  Skin:  Skin Assessment: Reviewed RN Assessment  Last BM:  3/18 -type 7  Height:   Ht Readings from Last 1 Encounters:  01/30/23 6' (1.829 m)    Weight:   Wt Readings from Last 1 Encounters:  02/04/23 73.4 kg    BMI:  Body mass index is 21.95 kg/m.  Estimated Nutritional Needs:   Kcal:  2100-2400 kcals  Protein:  100-125 grams  Fluid:  >/= 2L   Clayton Bibles, MS, RD, LDN Inpatient Clinical Dietitian Contact information available via Amion

## 2023-02-04 NOTE — Progress Notes (Signed)
SATURATION QUALIFICATIONS: (This note is used to comply with regulatory documentation for home oxygen)  Patient Saturations on Room Air at Rest = 92%  Patient Saturations on Room Air while Ambulating = 93%   

## 2023-02-04 NOTE — Progress Notes (Signed)
Patient ambulated in the hallway. Ambulation length of hallway. Oxygen Saturations on room Air held 92-93%. Tolerated activity well

## 2023-02-04 NOTE — Plan of Care (Signed)

## 2023-02-05 ENCOUNTER — Other Ambulatory Visit: Payer: Self-pay | Admitting: *Deleted

## 2023-02-05 DIAGNOSIS — J690 Pneumonitis due to inhalation of food and vomit: Secondary | ICD-10-CM

## 2023-02-05 DIAGNOSIS — J9601 Acute respiratory failure with hypoxia: Secondary | ICD-10-CM | POA: Diagnosis not present

## 2023-02-05 LAB — BASIC METABOLIC PANEL
Anion gap: 10 (ref 5–15)
BUN: 26 mg/dL — ABNORMAL HIGH (ref 8–23)
CO2: 26 mmol/L (ref 22–32)
Calcium: 9.4 mg/dL (ref 8.9–10.3)
Chloride: 93 mmol/L — ABNORMAL LOW (ref 98–111)
Creatinine, Ser: 0.93 mg/dL (ref 0.61–1.24)
GFR, Estimated: >60 mL/min (ref >60)
Glucose, Bld: 88 mg/dL (ref 70–99)
Potassium: 4.6 mmol/L (ref 3.5–5.1)
Sodium: 129 mmol/L — ABNORMAL LOW (ref 135–145)

## 2023-02-05 LAB — MAGNESIUM: Magnesium: 1.8 mg/dL (ref 1.7–2.4)

## 2023-02-05 LAB — CBC
HCT: 35.4 % — ABNORMAL LOW (ref 39.0–52.0)
Hemoglobin: 11.2 g/dL — ABNORMAL LOW (ref 13.0–17.0)
MCH: 28 pg (ref 26.0–34.0)
MCHC: 31.6 g/dL (ref 30.0–36.0)
MCV: 88.5 fL (ref 80.0–100.0)
Platelets: 244 10*3/uL (ref 150–400)
RBC: 4 MIL/uL — ABNORMAL LOW (ref 4.22–5.81)
RDW: 15.6 % — ABNORMAL HIGH (ref 11.5–15.5)
WBC: 5.6 10*3/uL (ref 4.0–10.5)
nRBC: 0 % (ref 0.0–0.2)

## 2023-02-05 LAB — GLUCOSE, CAPILLARY
Glucose-Capillary: 90 mg/dL (ref 70–99)
Glucose-Capillary: 131 mg/dL — ABNORMAL HIGH (ref 70–99)

## 2023-02-05 LAB — PHOSPHORUS: Phosphorus: 3.3 mg/dL (ref 2.5–4.6)

## 2023-02-05 MED ORDER — SCOPOLAMINE 1 MG/3DAYS TD PT72: 1.0000 | MEDICATED_PATCH | TRANSDERMAL | 12 refills | Status: DC

## 2023-02-05 MED ORDER — HYDRALAZINE HCL 25 MG PO TABS: 25.0000 mg | ORAL_TABLET | Freq: Three times a day (TID) | ORAL | 1 refills | Status: AC | PRN

## 2023-02-05 MED ORDER — ACETAMINOPHEN 325 MG PO TABS: 650.0000 mg | ORAL_TABLET | Freq: Four times a day (QID) | ORAL | Status: AC | PRN

## 2023-02-05 MED ORDER — METOCLOPRAMIDE HCL 5 MG PO TABS: 5.0000 mg | ORAL_TABLET | Freq: Three times a day (TID) | ORAL | 1 refills | Status: DC | PRN

## 2023-02-05 MED ORDER — SULFAMETHOXAZOLE-TRIMETHOPRIM 200-40 MG/5ML PO SUSP: 20.0000 mL | Freq: Two times a day (BID) | ORAL | 0 refills | Status: DC

## 2023-02-05 NOTE — TOC Transition Note (Signed)
Transition of Care [facility]) - CM/SW Discharge Note   Patient Details  Name: Lee Barnes MRN: [id number] Date of Birth: 01/12/1951  [facility]) CM/SW Contact:  Dessa Phi, RN Phone Number: 02/05/2023, 10:51 AM   Clinical Narrative:   d/c home to continue w/[facility] services of Palliative Care Services. Active w/[employer] dme-TF supplies. Has own transport home. No further CM needs.    Final next level of care: Home/Self Care Barriers to Discharge: No Barriers Identified   Patient Goals and CMS Choice CMS Medicare.gov Compare Post Acute Care list provided to:: Patient Represenative (must comment) ([name](spouse)) Choice offered to / list presented to : Spouse  Discharge Placement                         Discharge Plan and Services Additional resources added to the After Visit Summary for     Discharge Planning Services: CM Consult Post Acute Care Choice: Resumption of Svcs/PTA Provider                               Social Determinants of Health (SDOH) Interventions SDOH Screenings   Food Insecurity: No Food Insecurity (01/29/2023)  Housing: Low Risk  (01/29/2023)  Transportation Needs: No Transportation Needs (01/29/2023)  Utilities: Not At Risk (01/29/2023)  Depression (PHQ2-9): Low Risk  (11/01/2022)  Financial Resource Strain: Low Risk  (11/01/2022)  Physical Activity: Inactive (11/01/2022)  Stress: No Stress Concern Present (11/01/2022)  Tobacco Use: Low Risk  (01/28/2023)     Readmission Risk Interventions    01/29/2023   10:56 AM 12/29/2022   12:54 PM 12/12/2022    2:04 PM  Readmission Risk Prevention Plan  Transportation Screening Complete Complete Complete  PCP or Specialist Appt within 3-5 Days  Complete Complete  HRI or Home Care Consult Complete Complete Complete  Social Work Consult for [place] Planning/Counseling Complete Complete Complete  Palliative Care Screening Not Applicable Not Applicable Complete   Medication Review Press photographer) Complete Complete

## 2023-02-05 NOTE — Discharge Summary (Signed)
Physician Discharge Summary  Lee Barnes [id number] DOB: 30-Sep-1951 DOA: 01/28/2023  PCP: Denita Lung, MD  Admit date: 01/28/2023 Discharge date: 02/05/2023  Admitted From: Home Disposition: Home Recommendations for Outpatient Follow-up:  Follow up with PCP in 1-2 weeks Please obtain BMP/CBC in one week Please follow up with ENT  Home Health: Yes Equipment/Devices: None  Discharge Condition: Stable CODE STATUS: Full code Diet recommendation: Tube feed   brief/Interim Summary: 72 y.o. male with medical history significant for BCC and tongue base cancer who has a history of aspiration pneumonia last discharged from this hospital for the same about 1 month ago, he is feeding tube dependent and admitted to the hospital with recurrent aspiration pneumonia.  He was placed on high flow nasal cannula at 10 L/min initially.     Discharge Diagnoses:  Principal Problem:   Aspiration pneumonia ([place]) Active Problems:   Dysphagia   History of oropharyngeal cancer   Essential hypertension   GERD (gastroesophageal reflux disease)   On tube feeding diet   Hyperkalemia  Aspiration pneumonia/acute respiratory failure with hypoxia-he has had recurrent hospital admissions for the same.  This is his third admission in 2024 in March 2024.  He is trying everything he can to prevent this at home by suctioning keeping his head elevated.  Initially he was placed on 10 L of oxygen by nasal cannula which was then tapered down.  He was started on Unasyn. On 3/15 patient had episodes of emesis following which he became hypoxic and tachycardic.  Oxygen had to be bumped up to 5 L/min.   Sputum cultures positive for E. coli.  Previous cultures have been positive for Enterobacter.  Resistance noted to Unasyn.  Patient was changed over to cefepime. Patient has been seen by ENT at [facility] and was offered laryngectomy but patient was hesitant.  He may need to revisit this.  He will need to be stable prior  to considering surgery. Patient's respiratory status has been stable for the last 3 days.  WBC is normal.  He is afebrile. changed him to Bactrim based on sensitivities noted during this admission as well as previous admission.   Chest x-ray from this morning shows improvement in aeration.  Elevated right diaphragm noted which was seen previously as well.   Recurrent episodes of nausea and vomiting Reason for his recurrent nausea and vomiting is not clear.  Abdominal film did not show any acute findings.  CT scan was done which also does not show any significant findings.   Patient was started on Reglan 3 times a day as needed.  Has not had any further nausea vomiting.  However he is experiencing loose bowel movements.  He has chronically loose BMs at home.  Advised wife to avoid Imodium as much as possible.  He has appointment with the GI April 1 week. If he continues to have frequent bowel movements then may need to cut back on the dose of the Reglan to twice a day.  He was told that the goal is to avoid further episodes of nausea vomiting which can cause a severe aspiration pneumonia with respiratory distress which is life-threatening as opposed to having diarrhea few times a day.     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  He has had his G-tube for the last year and a half.      History of oropharyngeal cancer Stable.   Potassium abnormalities stable on discharge potassium 4.6.   Hyponatremia and hypophosphatemia-sodium 129 phosphorus 3.3  on discharge.  Follow-up with PCP in 1 week for blood work CBC CMP.   Normocytic anemia-hemoglobin 11.2 on discharge. Drop in hemoglobin is dilutional.  No evidence of overt blood loss.    Severe protein calorie malnutrition Nutrition Problem: Severe Malnutrition Etiology: chronic illness (BCC and tongue base cancer)  Nutrition Problem: Severe Malnutrition Etiology: chronic illness (BCC and tongue base cancer)    Signs/Symptoms:  severe muscle depletion, severe fat depletion     Interventions: Refer to RD note for recommendations, Tube feeding  Estimated body mass index is 21.96 kg/m as calculated from the following:   Height as of this encounter: 6' (1.829 m).   Weight as of this encounter: 73.4 kg.  Discharge Instructions  Discharge Instructions     Call MD for:  difficulty breathing, headache or visual disturbances   Complete by: As directed    Call MD for:  persistant dizziness or light-headedness   Complete by: As directed    Call MD for:  persistant nausea and vomiting   Complete by: As directed    Diet - low sodium heart healthy   Complete by: As directed    Increase activity slowly   Complete by: As directed       Allergies as of 02/05/2023       Reactions   Codeine Nausea And Vomiting        Medication List     STOP taking these medications    FLORASTOR PO       TAKE these medications    acetaminophen 325 MG tablet Commonly known as: TYLENOL Place 2 tablets (650 mg total) into feeding tube every 6 (six) hours as needed for mild pain (or Fever >/= 101).   albuterol (2.5 MG/3ML) 0.083% nebulizer solution Commonly known as: PROVENTIL Inhale 3 mLs (2.5 mg total) by nebulization every 4 (four) hours as needed for wheezing.   cetirizine 10 MG chewable tablet Commonly known as: ZYRTEC Place 10 mg into feeding tube daily as needed for allergies or rhinitis.   diphenhydrAMINE 25 MG tablet Commonly known as: BENADRYL Take 12.5 mg by mouth at bedtime as needed for allergies (to dry secretions).   feeding supplement (OSMOLITE 1.5 CAL) Liqd Place 1,425 mLs into feeding tube daily.   ferrous sulfate 300 (60 Fe) MG/5ML syrup Take 5 mLs (300 mg total) by mouth daily.   fluticasone 50 MCG/ACT nasal spray Commonly known as: FLONASE SPRAY TWO SPRAYS IN EACH NOSTRIL ONCE DAILY AS NEEDED What changed:  how much to take how to take this when to take this additional  instructions   guaiFENesin 100 MG/5ML liquid Commonly known as: ROBITUSSIN Place 30 mLs into feeding tube 2 (two) times daily.   hydrALAZINE 25 MG tablet Commonly known as: APRESOLINE Place 1 tablet (25 mg total) into feeding tube every 8 (eight) hours as needed (for SBP greater than 175).   metoCLOPramide 5 MG tablet Commonly known as: REGLAN Place 1 tablet (5 mg total) into feeding tube every 8 (eight) hours as needed for nausea.   omeprazole 40 MG capsule Commonly known as: PRILOSEC Removed the granules from the capsule and place him in the G-tube daily.   scopolamine 1 MG/3DAYS Commonly known as: TRANSDERM-SCOP Place 1 patch (1.5 mg total) onto the skin every 3 (three) days. Start taking on: February 06, 2023   sodium chloride 0.65 % Soln nasal spray Commonly known as: OCEAN Place 1 spray into both nostrils as needed for congestion.   sodium chloride HYPERTONIC 3 %  nebulizer solution Take 4 mLs by nebulization daily.   sulfamethoxazole-trimethoprim 200-40 MG/5ML suspension Commonly known as: BACTRIM Place 20 mLs into feeding tube 2 (two) times daily for 12 days.        Allergies  Allergen Reactions   Codeine Nausea And Vomiting    Consultations: NONE   Procedures/Studies: DG CHEST PORT 1 VIEW  Result Date: 02/04/2023 CLINICAL DATA:  Pneumonia EXAM: PORTABLE CHEST 1 VIEW COMPARISON:  01/28/2023 FINDINGS: Elevated right hemidiaphragm. Linear subsegmental atelectasis right mid lung. No pneumothorax. Unremarkable cardiac silhouette, partially obscured. IMPRESSION: Pulmonary vascular congestion. Elevated right hemidiaphragm and right perihilar subsegmental atelectasis. Electronically Signed   By: Sammie Bench M.D.   On: 02/04/2023 06:53   CT ABDOMEN PELVIS W CONTRAST  Result Date: 02/01/2023 CLINICAL DATA:  Recurrent nausea and vomiting. EXAM: CT ABDOMEN AND PELVIS WITH CONTRAST TECHNIQUE: Multidetector CT imaging of the abdomen and pelvis was performed using the  standard protocol following bolus administration of intravenous contrast. RADIATION DOSE REDUCTION: This exam was performed according to the departmental dose-optimization program which includes automated exposure control, adjustment of the mA and/or kV according to patient size and/or use of iterative reconstruction technique. CONTRAST:  135mL OMNIPAQUE IOHEXOL 300 MG/ML  SOLN COMPARISON:  05/23/2015 FINDINGS: Lower chest: Patchy airspace disease noted left lower lobe potentially atelectasis although pneumonia a concern. Collapse/consolidation in the right lower lobe is only seen on renal delay imaging as this portion of the chest was not included on the initial study. Hepatobiliary: No suspicious focal abnormality within the liver parenchyma. There is no evidence for gallstones, gallbladder wall thickening, or pericholecystic fluid. No intrahepatic or extrahepatic biliary dilation. Pancreas: No focal mass lesion. No dilatation of the main duct. No intraparenchymal cyst. No peripancreatic edema. Spleen: No splenomegaly. No focal mass lesion. Adrenals/Urinary Tract: No adrenal nodule or mass. Cortical scarring noted lower pole right kidney. 2.3 cm simple cyst in the interpolar right kidney was present previously. No followup imaging is recommended. Tiny hypoattenuating lesions in the left kidney are too small to characterize but likely benign. No followup imaging is recommended. No evidence for hydroureter. The urinary bladder appears normal for the degree of distention. Stomach/Bowel: Stomach is unremarkable. No gastric wall thickening. No evidence of outlet obstruction. Gastrostomy tube noted with tip in the gastric lumen. Duodenum is normally positioned as is the ligament of Treitz. Duodenal diverticulum noted. No small bowel wall thickening. No small bowel dilatation. The terminal ileum is normal. The appendix is not well visualized, but there is no edema or inflammation in the region of the cecum. No gross  colonic mass. No colonic wall thickening. Diverticular changes are noted in the left colon without evidence of diverticulitis. Vascular/Lymphatic: There is moderate atherosclerotic calcification of the abdominal aorta without aneurysm. Small penetrating ulcer or ulcerated plaque noted infrarenal abdominal aorta. There is no gastrohepatic or hepatoduodenal ligament lymphadenopathy. No retroperitoneal or mesenteric lymphadenopathy. No pelvic sidewall lymphadenopathy. Reproductive: Prostate gland is enlarged. Other: No intraperitoneal free fluid. Musculoskeletal: No worrisome lytic or sclerotic osseous abnormality. Mesh in the low anterior pelvis suggest bilateral groin hernia repair. IMPRESSION: 1. No acute findings in the abdomen or pelvis. Specifically, no findings to explain the patient's history of nausea and vomiting. 2. Bibasilar collapse/consolidation in the lungs. Pneumonia a concern. 3. Left colonic diverticulosis without diverticulitis. 4. Prostatomegaly. 5.  Aortic Atherosclerosis (ICD10-I70.0). Electronically Signed   By: Misty Stanley M.D.   On: 02/01/2023 20:12   DG Abd Portable 2V  Result Date: 01/31/2023 CLINICAL DATA:  Nausea  and vomiting EXAM: PORTABLE ABDOMEN - 2 VIEW COMPARISON:  Chest radiograph 01/28/2023. FINDINGS: 2 supine views of the abdomen and pelvis. Marked right hemidiaphragm elevation. Bibasilar airspace disease. Motion degradation on the semi-erect view of the lower chest and upper abdomen. No gross free intraperitoneal air or significant air-fluid levels. Numerous leads and wires project over the chest and abdomen. Supine image of the lower abdomen and pelvis demonstrates no gaseous distention of bowel loops. Distal gas and stool. IMPRESSION: No acute process or explanation for patient's symptoms Electronically Signed   By: Abigail Miyamoto M.D.   On: 01/31/2023 12:49   DG Chest 2 View  Result Date: 01/28/2023 CLINICAL DATA:  Shortness of breath. Throat cancer and episodes of  vomiting which can cause aspiration pneumonia EXAM: CHEST - 2 VIEW COMPARISON:  12/27/2022 FINDINGS: Elevated right hemidiaphragm and similar right basilar opacities. New hazy opacities in the left mid and lower lung. No pleural effusion or pneumothorax. Stable cardiomediastinal silhouette. IMPRESSION: New left basilar opacities and similar right basilar opacities. Differential considerations include atelectasis, aspiration, or pneumonia. Electronically Signed   By: Placido Sou M.D.   On: 01/28/2023 23:05   (Echo, Carotid, EGD, Colonoscopy, ERCP)    Subjective: He feels better on room air saturating above 94% anxious to go home  Discharge Exam: Vitals:   02/05/23 0818 02/05/23 1110  BP:    Pulse:    Resp:    Temp:    SpO2: 98% 92%   Vitals:   02/05/23 0351 02/05/23 0510 02/05/23 0818 02/05/23 1110  BP:  123/81    Pulse:  86    Resp:  20    Temp:  98.5 F (36.9 C)    TempSrc:  Oral    SpO2:  100% 98% 92%  Weight: 73.4 kg     Height:        General: Pt is alert, awake, not in acute distress Cardiovascular: RRR, S1/S2 +, no rubs, no gallops Respiratory: Diminished breath sounds bilaterally, no wheezing, no rhonchi Abdominal: Soft, NT, ND, bowel sounds + Extremities: no edema, no cyanosis    The results of significant diagnostics from this hospitalization (including imaging, microbiology, ancillary and laboratory) are listed below for reference.     Microbiology: Recent Results (from the past 240 hour(s))  Blood Culture (routine x 2)     Status: None   Collection Time: 01/28/23 10:25 PM   Specimen: BLOOD RIGHT FOREARM  Result Value Ref Range Status   Specimen Description   Final    BLOOD RIGHT FOREARM Performed at [facility] [address] [place], [place] [address]    Special Requests   Final    BOTTLES DRAWN AEROBIC AND ANAEROBIC Blood Culture adequate volume Performed at [facility] [address], [place], [place]  [address]    Culture   Final    NO GROWTH 5 DAYS Performed at [facility], [place] [address], [place], [place] [address]    Report Status 02/02/2023 FINAL  Final  Resp panel by RT-PCR (RSV, Flu A&B, Covid)     Status: None   Collection Time: 01/28/23 10:29 PM   Specimen: Nasal Swab  Result Value Ref Range Status   SARS Coronavirus 2 by RT PCR NEGATIVE NEGATIVE Final    Comment: (NOTE) SARS-CoV-2 target nucleic acids are NOT DETECTED.  The SARS-CoV-2 RNA is generally detectable in upper respiratory specimens during the acute phase of infection. The lowest concentration of SARS-CoV-2 viral copies this assay  can detect is 138 copies/mL. A negative result does not preclude SARS-Cov-2 infection and should not be used as the sole basis for treatment or other patient management decisions. A negative result may occur with  improper specimen collection/handling, submission of specimen other than nasopharyngeal swab, presence of viral mutation(s) within the areas targeted by this assay, and inadequate number of viral copies(<138 copies/mL). A negative result must be combined with clinical observations, patient history, and epidemiological information. The expected result is Negative.  Fact Sheet for Patients:  EntrepreneurPulse.com.au  Fact Sheet for Healthcare Providers:  IncredibleEmployment.be  This test is no t yet approved or cleared by the [place] FDA and  has been authorized for detection and/or diagnosis of SARS-CoV-2 by FDA under an Emergency Use Authorization (EUA). This EUA will remain  in effect (meaning this test can be used) for the duration of the COVID-19 declaration under Section 564(b)(1) of the Act, 21 U.S.C.section 360bbb-3(b)(1), unless the authorization is terminated  or revoked sooner.       Influenza A by PCR NEGATIVE NEGATIVE Final   Influenza B by PCR NEGATIVE NEGATIVE Final    Comment: (NOTE) The Xpert Xpress  SARS-CoV-2/FLU/RSV plus assay is intended as an aid in the diagnosis of influenza from Nasopharyngeal swab specimens and should not be used as a sole basis for treatment. Nasal washings and aspirates are unacceptable for Xpert Xpress SARS-CoV-2/FLU/RSV testing.  Fact Sheet for Patients: EntrepreneurPulse.com.au  Fact Sheet for Healthcare Providers: IncredibleEmployment.be  This test is not yet approved or cleared by the [place] FDA and has been authorized for detection and/or diagnosis of SARS-CoV-2 by FDA under an Emergency Use Authorization (EUA). This EUA will remain in effect (meaning this test can be used) for the duration of the COVID-19 declaration under Section 564(b)(1) of the Act, 21 U.S.C. section 360bbb-3(b)(1), unless the authorization is terminated or revoked.     Resp Syncytial Virus by PCR NEGATIVE NEGATIVE Final    Comment: (NOTE) Fact Sheet for Patients: EntrepreneurPulse.com.au  Fact Sheet for Healthcare Providers: IncredibleEmployment.be  This test is not yet approved or cleared by the [place] FDA and has been authorized for detection and/or diagnosis of SARS-CoV-2 by FDA under an Emergency Use Authorization (EUA). This EUA will remain in effect (meaning this test can be used) for the duration of the COVID-19 declaration under Section 564(b)(1) of the Act, 21 U.S.C. section 360bbb-3(b)(1), unless the authorization is terminated or revoked.  Performed at [facility], [place] [address], [place], [place] [address]   Blood Culture (routine x 2)     Status: None   Collection Time: 01/28/23 10:45 PM   Specimen: BLOOD  Result Value Ref Range Status   Specimen Description   Final    BLOOD BLOOD RIGHT WRIST Performed at [facility] [address], [place], [place] [address]    Special Requests   Final    BOTTLES DRAWN AEROBIC AND ANAEROBIC  Blood Culture results may not be optimal due to an inadequate volume of blood received in culture bottles Performed at [facility] [address], [place], [place] [address]    Culture   Final    NO GROWTH 5 DAYS Performed at [facility], [place] [address], [place], [place] [address]    Report Status 02/02/2023 FINAL  Final  Expectorated Sputum Assessment w Gram Stain, Rflx to Resp Cult     Status: None   Collection Time: 01/29/23  4:20 AM  Specimen: Expectorated Sputum  Result Value Ref Range Status   Specimen Description EXPECTORATED SPUTUM  Final   Special Requests NONE  Final   Sputum evaluation   Final    THIS SPECIMEN IS ACCEPTABLE FOR SPUTUM CULTURE Performed at [facility], [place] [address] [place], [place] [address]    Report Status 01/29/2023 FINAL  Final  Culture, Respiratory w Gram Stain     Status: None   Collection Time: 01/29/23  4:20 AM  Result Value Ref Range Status   Specimen Description   Final    EXPECTORATED SPUTUM Performed at [facility] [address] [place], [place] [address]    Special Requests   Final    NONE Reflexed from [id number] Performed at [facility] [address], [place], [place] [address]    Gram Stain   Final    NO WBC SEEN ABUNDANT GRAM NEGATIVE RODS FEW BUDDING YEAST SEEN RARE GRAM POSITIVE COCCI IN CLUSTERS    Culture   Final    ABUNDANT ESCHERICHIA COLI NO STAPHYLOCOCCUS AUREUS ISOLATED No Pseudomonas species isolated Performed at [facility], [address], [place], [place] [address]    Report Status 01/31/2023 FINAL  Final   Organism ID, Bacteria ESCHERICHIA COLI  Final      Susceptibility   Escherichia coli - MIC*    AMPICILLIN >=32 RESISTANT Resistant     CEFEPIME <=0.12 SENSITIVE Sensitive     CEFTAZIDIME <=1 SENSITIVE Sensitive     CEFTRIAXONE 1 SENSITIVE Sensitive     CIPROFLOXACIN <=0.25 SENSITIVE Sensitive     GENTAMICIN  <=1 SENSITIVE Sensitive     IMIPENEM <=0.25 SENSITIVE Sensitive     TRIMETH/SULFA <=20 SENSITIVE Sensitive     AMPICILLIN/SULBACTAM >=32 RESISTANT Resistant     PIP/TAZO 8 SENSITIVE Sensitive     * ABUNDANT ESCHERICHIA COLI  MRSA Next Gen by PCR, Nasal     Status: None   Collection Time: 01/29/23  4:54 AM   Specimen: Nasal Mucosa; Nasal Swab  Result Value Ref Range Status   MRSA by PCR Next Gen NOT DETECTED NOT DETECTED Final    Comment: (NOTE) The GeneXpert MRSA Assay (FDA approved for NASAL specimens only), is one component of a comprehensive MRSA colonization surveillance program. It is not intended to diagnose MRSA infection nor to guide or monitor treatment for MRSA infections. Test performance is not FDA approved in patients less than 43 years old. Performed at [facility], [place] [address] [place], [place] [address]      Labs: BNP (last 3 results) Recent Labs    10/06/22 0620  BNP [id number]   Basic Metabolic Panel: Recent Labs  Lab 01/30/23 1720 01/31/23 0600 01/31/23 1635 02/01/23 0528 02/02/23 0503 02/03/23 0448 02/04/23 0546 02/05/23 0514  NA  --  133*  --  133* 131* 131* 130* 129*  K  --  4.6  --  4.6 4.2 3.4* 4.1 4.6  CL  --  94*  --  94* 96* 95* 95* 93*  CO2  --  28  --  29 25 26 25 26   GLUCOSE  --  88  --  87 67* 121* 118* 88  BUN  --  30*  --  32* 28* 26* 23 26*  CREATININE  --  0.85  --  1.08 1.05 0.85 0.83 0.93  CALCIUM  --  9.1  --  9.2 8.8* 8.7* 8.9 9.4  MG 2.2 1.8 1.9  --   --  1.9  --  1.8  PHOS 3.4 3.3 3.5  --   --  2.9  --  3.3   Liver Function Tests: No results for input(s): "AST", "ALT", "ALKPHOS", "BILITOT", "PROT", "ALBUMIN" in the last 168 hours. No results for input(s): "LIPASE", "AMYLASE" in the last 168 hours. No results for input(s): "AMMONIA" in the last 168 hours. CBC: Recent Labs  Lab 02/01/23 0528 02/02/23 0503 02/03/23 0448 02/04/23 0546 02/05/23 0514  WBC 10.0 8.3 5.9 4.4 5.6  HGB 11.3* 10.8* 10.4*  10.7* 11.2*  HCT 36.8* 33.7* 32.4* 33.3* 35.4*  MCV 90.2 88.7 88.8 87.9 88.5  PLT 227 213 229 223 244   Cardiac Enzymes: No results for input(s): "CKTOTAL", "CKMB", "CKMBINDEX", "TROPONINI" in the last 168 hours. BNP: Invalid input(s): "POCBNP" CBG: Recent Labs  Lab 02/04/23 1624 02/04/23 1957 02/04/23 2346 02/05/23 0340 02/05/23 0732  GLUCAP 133* 103* 92 90 131*   D-Dimer No results for input(s): "DDIMER" in the last 72 hours. Hgb A1c No results for input(s): "HGBA1C" in the last 72 hours. Lipid Profile No results for input(s): "CHOL", "HDL", "LDLCALC", "TRIG", "CHOLHDL", "LDLDIRECT" in the last 72 hours. Thyroid function studies No results for input(s): "TSH", "T4TOTAL", "T3FREE", "THYROIDAB" in the last 72 hours.  Invalid input(s): "FREET3" Anemia work up No results for input(s): "VITAMINB12", "FOLATE", "FERRITIN", "TIBC", "IRON", "RETICCTPCT" in the last 72 hours. Urinalysis    Component Value Date/Time   COLORURINE YELLOW 01/28/2023 [place] 01/28/2023 2225   LABSPEC 1.015 01/28/2023 2225   LABSPEC 1.015 12/13/2010 1028   PHURINE 7.5 01/28/2023 2225   GLUCOSEU NEGATIVE 01/28/2023 2225   HGBUR NEGATIVE 01/28/2023 2225   BILIRUBINUR NEGATIVE 01/28/2023 2225   BILIRUBINUR n 02/19/2016 1519   BILIRUBINUR Negative 12/13/2010 [place] 01/28/2023 2225   PROTEINUR NEGATIVE 01/28/2023 2225   UROBILINOGEN negative 02/19/2016 1519   UROBILINOGEN 0.2 04/01/2015 2316   NITRITE NEGATIVE 01/28/2023 2225   LEUKOCYTESUR NEGATIVE 01/28/2023 2225   LEUKOCYTESUR Negative 12/13/2010 1028   Sepsis Labs Recent Labs  Lab 02/02/23 0503 02/03/23 0448 02/04/23 0546 02/05/23 0514  WBC 8.3 5.9 4.4 5.6   Microbiology Recent Results (from the past 240 hour(s))  Blood Culture (routine x 2)     Status: None   Collection Time: 01/28/23 10:25 PM   Specimen: BLOOD RIGHT FOREARM  Result Value Ref Range Status   Specimen Description   Final     BLOOD RIGHT FOREARM Performed at [facility], [place] [address], [place], [place] [address]    Special Requests   Final    BOTTLES DRAWN AEROBIC AND ANAEROBIC Blood Culture adequate volume Performed at [facility] [address] [place], [place] [address]    Culture   Final    NO GROWTH 5 DAYS Performed at [facility], [place] [address], [place], [place] [address]    Report Status 02/02/2023 FINAL  Final  Resp panel by RT-PCR (RSV, Flu A&B, Covid)     Status: None   Collection Time: 01/28/23 10:29 PM   Specimen: Nasal Swab  Result Value Ref Range Status   SARS Coronavirus 2 by RT PCR NEGATIVE NEGATIVE Final    Comment: (NOTE) SARS-CoV-2 target nucleic acids are NOT DETECTED.  The SARS-CoV-2 RNA is generally detectable in upper respiratory specimens during the acute phase of infection. The lowest concentration of SARS-CoV-2 viral copies this assay can detect is 138 copies/mL. A negative result does not preclude SARS-Cov-2 infection and  should not be used as the sole basis for treatment or other patient management decisions. A negative result may occur with  improper specimen collection/handling, submission of specimen other than nasopharyngeal swab, presence of viral mutation(s) within the areas targeted by this assay, and inadequate number of viral copies(<138 copies/mL). A negative result must be combined with clinical observations, patient history, and epidemiological information. The expected result is Negative.  Fact Sheet for Patients:  EntrepreneurPulse.com.au  Fact Sheet for Healthcare Providers:  IncredibleEmployment.be  This test is no t yet approved or cleared by the [place] FDA and  has been authorized for detection and/or diagnosis of SARS-CoV-2 by FDA under an Emergency Use Authorization (EUA). This EUA will remain  in effect (meaning this test can be used) for the duration of  the COVID-19 declaration under Section 564(b)(1) of the Act, 21 U.S.C.section 360bbb-3(b)(1), unless the authorization is terminated  or revoked sooner.       Influenza A by PCR NEGATIVE NEGATIVE Final   Influenza B by PCR NEGATIVE NEGATIVE Final    Comment: (NOTE) The Xpert Xpress SARS-CoV-2/FLU/RSV plus assay is intended as an aid in the diagnosis of influenza from Nasopharyngeal swab specimens and should not be used as a sole basis for treatment. Nasal washings and aspirates are unacceptable for Xpert Xpress SARS-CoV-2/FLU/RSV testing.  Fact Sheet for Patients: EntrepreneurPulse.com.au  Fact Sheet for Healthcare Providers: IncredibleEmployment.be  This test is not yet approved or cleared by the [place] FDA and has been authorized for detection and/or diagnosis of SARS-CoV-2 by FDA under an Emergency Use Authorization (EUA). This EUA will remain in effect (meaning this test can be used) for the duration of the COVID-19 declaration under Section 564(b)(1) of the Act, 21 U.S.C. section 360bbb-3(b)(1), unless the authorization is terminated or revoked.     Resp Syncytial Virus by PCR NEGATIVE NEGATIVE Final    Comment: (NOTE) Fact Sheet for Patients: EntrepreneurPulse.com.au  Fact Sheet for Healthcare Providers: IncredibleEmployment.be  This test is not yet approved or cleared by the [place] FDA and has been authorized for detection and/or diagnosis of SARS-CoV-2 by FDA under an Emergency Use Authorization (EUA). This EUA will remain in effect (meaning this test can be used) for the duration of the COVID-19 declaration under Section 564(b)(1) of the Act, 21 U.S.C. section 360bbb-3(b)(1), unless the authorization is terminated or revoked.  Performed at [facility], [place] [address], [place], [place] [address]   Blood Culture (routine x 2)     Status: None   Collection  Time: 01/28/23 10:45 PM   Specimen: BLOOD  Result Value Ref Range Status   Specimen Description   Final    BLOOD BLOOD RIGHT WRIST Performed at [facility] [address] [place], [place] [address]    Special Requests   Final    BOTTLES DRAWN AEROBIC AND ANAEROBIC Blood Culture results may not be optimal due to an inadequate volume of blood received in culture bottles Performed at [facility] [address], [place], [place] [address]    Culture   Final    NO GROWTH 5 DAYS Performed at [facility], [place] [address] [place], [place] [address]    Report Status 02/02/2023 FINAL  Final  Expectorated Sputum Assessment w Gram Stain, Rflx to Resp Cult     Status: None   Collection Time: 01/29/23  4:20 AM   Specimen: Expectorated Sputum  Result Value Ref Range Status   Specimen Description EXPECTORATED  SPUTUM  Final   Special Requests NONE  Final   Sputum evaluation   Final    THIS SPECIMEN IS ACCEPTABLE FOR SPUTUM CULTURE Performed at [facility], [place] [address], [place], [place] [address]    Report Status 01/29/2023 FINAL  Final  Culture, Respiratory w Gram Stain     Status: None   Collection Time: 01/29/23  4:20 AM  Result Value Ref Range Status   Specimen Description   Final    EXPECTORATED SPUTUM Performed at [facility] [address], [place], [place] [address]    Special Requests   Final    NONE Reflexed from [id number] Performed at [facility] [address] [place], [place] [address]    Gram Stain   Final    NO WBC SEEN ABUNDANT GRAM NEGATIVE RODS FEW BUDDING YEAST SEEN RARE GRAM POSITIVE COCCI IN CLUSTERS    Culture   Final    ABUNDANT ESCHERICHIA COLI NO STAPHYLOCOCCUS AUREUS ISOLATED No Pseudomonas species isolated Performed at [name], [address], [place], [place] [address]    Report Status 01/31/2023 FINAL  Final   Organism ID, Bacteria  ESCHERICHIA COLI  Final      Susceptibility   Escherichia coli - MIC*    AMPICILLIN >=32 RESISTANT Resistant     CEFEPIME <=0.12 SENSITIVE Sensitive     CEFTAZIDIME <=1 SENSITIVE Sensitive     CEFTRIAXONE 1 SENSITIVE Sensitive     CIPROFLOXACIN <=0.25 SENSITIVE Sensitive     GENTAMICIN <=1 SENSITIVE Sensitive     IMIPENEM <=0.25 SENSITIVE Sensitive     TRIMETH/SULFA <=20 SENSITIVE Sensitive     AMPICILLIN/SULBACTAM >=32 RESISTANT Resistant     PIP/TAZO 8 SENSITIVE Sensitive     * ABUNDANT ESCHERICHIA COLI  MRSA Next Gen by PCR, Nasal     Status: None   Collection Time: 01/29/23  4:54 AM   Specimen: Nasal Mucosa; Nasal Swab  Result Value Ref Range Status   MRSA by PCR Next Gen NOT DETECTED NOT DETECTED Final    Comment: (NOTE) The GeneXpert MRSA Assay (FDA approved for NASAL specimens only), is one component of a comprehensive MRSA colonization surveillance program. It is not intended to diagnose MRSA infection nor to guide or monitor treatment for MRSA infections. Test performance is not FDA approved in patients less than 6 years old. Performed at [facility], [place] [address] [place], [place] [address]      Time coordinating discharge: 39 minutes  SIGNED:   Georgette Shell, MD  [facility] 02/05/2023, 4:55 PM

## 2023-02-05 NOTE — Progress Notes (Addendum)
[facility] Liaison Note  Current palliative patient discharged from hospital.   [facility] outpatient palliative will follow up with patient.  Please call with any questions.  Margaretmary Eddy, BSN, RN [facility] Liaison [phone]

## 2023-02-06 ENCOUNTER — Telehealth: Payer: Self-pay | Admitting: Family Medicine

## 2023-02-06 ENCOUNTER — Ambulatory Visit: Payer: Self-pay

## 2023-02-06 ENCOUNTER — Telehealth: Payer: Self-pay

## 2023-02-06 ENCOUNTER — Other Ambulatory Visit (HOSPITAL_COMMUNITY): Payer: Self-pay

## 2023-02-06 MED ORDER — SULFAMETHOXAZOLE-TRIMETHOPRIM 200-40 MG/5ML PO SUSP
20.0000 mL | Freq: Two times a day (BID) | ORAL | 0 refills | Status: AC
  Filled 2023-02-06: qty 473, 12d supply, fill #0

## 2023-02-06 NOTE — Transitions of Care (Post Inpatient/ED Visit) (Signed)
   02/06/2023  Name: Lee Barnes MRN: [id number] DOB: [birth year]  Today's TOC FU Call Status: Today's TOC FU Call Status:: Successful TOC FU Call Competed TOC FU Call Complete Date: 02/06/23  Transition Care Management Follow-up Telephone Call Date of Discharge: 02/05/23 Discharge Facility: [facility]) Type of Discharge: Inpatient Admission Primary Inpatient Discharge Diagnosis:: "acute resp failure w/ hypoxia" How have you been since you were released from the hospital?: Better (Spoke w/ spouse-currently out running errands-states pt doing well-no acute sxs at present) Any questions or concerns?: Yes Patient Questions/Concerns:: spouse voices Lee Barnes pharmacy will not have abx-Bactrim in stock until 3/25-spouse needing assistance getting med sooner Patient Questions/Concerns Addressed: Other: (RN CM contacted [facility] and verified med available and able to be filled today. Lee Barnes, at pharmacy will contact Lee Barnes to get script transferred over.)  Items Reviewed: Did you receive and understand the discharge instructions provided?: Yes Medications obtained and verified?: No (spouse out running errands-unable to do med reconciliation at this time) Any new allergies since your discharge?: No Dietary orders reviewed?: Yes Type of Diet Ordered:: tube feedings Do you have support at home?: Yes People in Home: spouse Name of Support/Comfort Primary Source: [facility] and Equipment/Supplies: [place] Ordered?: No Any new equipment or medical supplies ordered?: Yes Name of Medical supply agency?: [employer]-tube feedings Were you able to get the equipment/medical supplies?: Yes Do you have any questions related to the use of the equipment/supplies?: No  Functional Questionnaire: Do you need assistance with bathing/showering or dressing?: Yes Do you need assistance with meal preparation?: Yes Do you need assistance with eating?: Yes Do you have  difficulty maintaining continence: No Do you need assistance with getting out of bed/getting out of a chair/moving?: No Do you have difficulty managing or taking your medications?: Yes  Follow up appointments reviewed: PCP Follow-up appointment confirmed?: Yes Date of PCP follow-up appointment?: 02/13/23 Follow-up Provider: Dr. Redmond School Specialist [facility] Follow-up appointment confirmed?: No Reason Specialist Follow-Up Not Confirmed: Patient has Specialist Provider Number and will Call for Appointment Do you need transportation to your follow-up appointment?: No (spouse takes pt to appts) Do you understand care options if your condition(s) worsen?: Yes-patient verbalized understanding  SDOH Interventions Today    Flowsheet Row Most Recent Value  SDOH Interventions   Food Insecurity Interventions Intervention Not Indicated  Transportation Interventions Intervention Not Indicated      TOC Interventions Today    Flowsheet Row Most Recent Value  TOC Interventions   TOC Interventions Discussed/Reviewed TOC Interventions Discussed      Interventions Today    Flowsheet Row Most Recent Value  General Interventions   General Interventions Discussed/Reviewed Communication with  Communication with Pharmacists  [facility] [phone] w/ [place]  Education Interventions   Education Provided Provided Education  Provided Verbal Education On Nutrition, Medication, When to see the doctor  Nutrition Interventions   Nutrition Discussed/Reviewed Nutrition Discussed  [tube feedings]  Pharmacy Interventions   Pharmacy Dicussed/Reviewed Pharmacy Topics Discussed, Medications and their functions        Hetty Blend [facility] Care Management Care Management Community Coordinator Direct Phone: [phone] Toll Free: [phone] Fax: [phone]

## 2023-02-06 NOTE — Patient Outreach (Signed)
  Care Coordination   Chart Review/Case Collaboration   Visit Note   02/06/2023 Name: Lee Barnes MRN: [id number] DOB: 1951/09/09  Lee Barnes is a 72 y.o. year old male who sees Lee Lung, MD for primary care. I reviewed patient's chart today after receiving an urgent RN CC referral.   What matters to the patients health and wellness today?  N/a    Goals Addressed             This Visit's Progress    RN Care Coordination Activities: further follow up needed       Care Coordination Interventions: Received urgent referral for Nurse Care Coordination outreach  Noted the following referral per [place] Management RN Lee Barnes, indicating;  Please refer [facility] RN care coordinator  Extreme high risk rating with 5 IP in 6 months Admitting dx of PNA.  PCP: Dr. Redmond Barnes with [facility],  Insur: [facility] Info: Pt active with [place] will schedule initial telephonic nurse outreach with patient/wife           SDOH assessments and interventions completed:  No     Care Coordination Interventions:  Yes, provided   Follow up plan:  [place] will schedule a telephonic nurse outreach with patient/wife    Encounter Outcome:  Pt. Visit Completed

## 2023-02-06 NOTE — Telephone Encounter (Signed)
Pt wife called and stated she spoke with you yesterday evening about the bactrim that was prescribed for him from the hospital would only last him 2 days and he needs it for 12. She stated you told her you could fix this. Prefers [facility] [id number] - [place], [place]

## 2023-02-07 ENCOUNTER — Telehealth: Payer: Self-pay | Admitting: *Deleted

## 2023-02-07 NOTE — Progress Notes (Signed)
  Care Coordination   Note   02/07/2023 Name: KAIZER THOME MRN: [id number] DOB: July 29, 1951  DEYLAN ECHELBARGER is a 72 y.o. year old male who sees Denita Lung, MD for primary care. I reached out to Teena Irani by phone today to offer care coordination services.  Mr. Yeley was given information about Care Coordination services today including:   The Care Coordination services include support from the care team which includes your Nurse Coordinator, Clinical Social Worker, or Pharmacist.  The Care Coordination team is here to help remove barriers to the health concerns and goals most important to you. Care Coordination services are voluntary, and the patient may decline or stop services at any time by request to their care team member.   Care Coordination Consent Status: Patient agreed to services and verbal consent obtained.   Follow up plan:  Telephone appointment with care coordination team member scheduled for:  02/10/23  Encounter Outcome:  Pt. Scheduled  [place]  Direct Dial: [phone]

## 2023-02-10 ENCOUNTER — Ambulatory Visit: Payer: Self-pay

## 2023-02-11 NOTE — Patient Instructions (Addendum)
Visit Information  Thank you for taking time to visit with me today. Please don't hesitate to contact me if I can be of assistance to you.   Following are the goals we discussed today:   Goals Addressed             This Visit's Progress    Avoid Nutritional Imbalance       Care Coordination Interventions: Evaluation of current treatment plan related to nutritional imbalance  and patient's adherence to plan as established by provider Determined patient has been experiencing nausea/vomiting with unknown etiology, contributing to his aspirational pneumonia  Assessed for nutritional imbalance, determined patient has a G-tube and receives continuous feedings to provide a slower rate Educated wife on ways to avoid nausea/vomiting with tube feeds  Instructed wife to contact patient's doctor to report new or worsening symptoms  Reviewed medications with patient and discussed importance of medication adherence Reviewed and discussed upcoming scheduled appointment with Dr. 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  Discussed most recent IP admission including review of post discharge plan of care Review of patient status, including review of consultant's reports, relevant laboratory and other test results, and medications completed  Educated wife about the basic disease process related to aspirational pneumonia including the risk for death associated with this condition Reviewed and discussed patient's past follow up with [facility] who recommended patient undergo a laryngectomy Determined patient has Palliative Care in place at this time  Determined patient does not wish to  undergo this procedure due to his fear of not being able to speak Educated wife on early signs/symptoms suggestive of aspirational pneumonia and when to call the doctor Reviewed and discussed upcoming scheduled appointment for post discharge follow up with Dr. Redmond School scheduled for 02/13/23 @3 :30 PM Assessed for SDOH barriers, SW referral sent to Margarette Asal LCSW to provide emotional support due to caregiver burden to wife [name] Active listening / Reflection utilized  Emotional Support Provided       COMPLETED: RN Care Coordination Activities: further follow up needed       Care Coordination Interventions: Received urgent referral for Nurse Care Coordination outreach  Noted the following referral per [place] Management RN Rhys Martini, indicating;  Please refer [facility] RN care coordinator  Extreme high risk rating with 5 IP in 6 months Admitting dx of PNA.  PCP: Dr. Redmond School with [facility],  Insur: [facility] Info: Pt active with [place] will schedule initial telephonic nurse outreach with patient/wife           Our next appointment is by telephone on 02/24/23 at 11:00 AM  Please call the care guide team at [phone] if you need to cancel or reschedule your appointment.   If you are experiencing a Mental Health or [place] or need someone to talk to, please call 1-800-273-TALK (toll free, 24 hour hotline) go to [facility] [address] [place] [phone])  Patient verbalizes understanding of instructions and care plan provided today and agrees to view in [place]. Active MyChart status and patient understanding of how to access instructions and care plan via MyChart confirmed with  patient.     Barb Merino, RN, BSN, CCM Care Management Coordinator [facility] Care Management  Direct Phone: [phone]

## 2023-02-11 NOTE — Patient Outreach (Signed)
Care Coordination   Initial Visit Note   02/10/2023 Name: Lee Barnes MRN: [id number] DOB: 1951-05-27  Lee Barnes is a 72 y.o. year old male who sees Lee Lung, MD for primary care. I spoke with [name] by phone today.  What matters to the patients health and wellness today?  Patient will keep all scheduled appointments as directed. [name] will report symptoms suggestive of aspiration to patient's doctor promptly.     Goals Addressed             This Visit's Progress    Avoid Nutritional Imbalance       Care Coordination Interventions: Completed successful outbound call with [name] Evaluation of current treatment plan related to nutritional imbalance  and patient's adherence to plan as established by provider Determined patient has been experiencing nausea/vomiting with unknown etiology, contributing to his aspirational pneumonia  Assessed for nutritional imbalance, determined patient has a G-tube and receives continuous feedings to provide a slower rate Educated wife on ways to avoid nausea/vomiting with tube feeds  Instructed wife to contact patient's doctor to report new or worsening symptoms  Reviewed medications with patient and discussed importance of medication adherence Reviewed and discussed upcoming scheduled appointment with Lee Barnes, GI MD         No complications from aspirational pnemonia       Care Coordination Interventions: Completed successful outbound call with [name] Evaluation of current treatment plan related to aspirational pneumonia  and patient's adherence to plan as established by provider 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  Discussed most recent IP admission including review of post discharge plan of care Review of patient status, including review of consultant's reports, relevant laboratory and other test results, and medications completed  Educated wife about the basic disease  process related to aspirational pneumonia including the risk for death associated with this condition Reviewed and discussed patient's past follow up with [facility] who recommended patient undergo a laryngectomy Determined patient has Palliative Care in place at this time  Determined patient does not wish to undergo this procedure due to his fear of not being able to speak Educated wife on early signs/symptoms suggestive of aspirational pneumonia and when to call the doctor Reviewed and discussed upcoming scheduled appointment for post discharge follow up with Dr. Redmond Barnes scheduled for 02/13/23 @3 :58 PM Assessed for SDOH barriers, SW referral sent to Lee Asal LCSW to provide emotional support due to caregiver burden to [name] Active listening / Reflection utilized  Emotional Support Provided        COMPLETED: RN Care Coordination Activities: further follow up needed       Care Coordination Interventions: Received urgent referral for Nurse Care Coordination outreach  Noted the following referral per [place] Management RN Lee Barnes, indicating;  Please refer [facility] RN care coordinator  Extreme high risk rating with 5 IP in 6 months Admitting dx of PNA.  PCP: Dr. Redmond [facility] with [facility],  Insur: [facility] Info: Pt active with [facility] will schedule initial telephonic nurse outreach with patient/wife       Interventions Today    Flowsheet Row Most Recent Value  Chronic Disease   Chronic disease during today's visit Other  [Aspirational pneumonia,  nausea/vomiting]  General Interventions   General Interventions Discussed/Reviewed General Interventions Discussed, General Interventions Reviewed, Doctor Visits, Durable Medical Equipment (DME)  Doctor Visits Discussed/Reviewed PCP, Specialist  Durable Medical Equipment (  DME) Other  [tube feeding pump]  Communication with Social Work  Lee See LCSW]  Education  Interventions   Education Provided Provided Education  Provided Verbal Education On Nutrition, When to Barnes the doctor, Labs, Medication  Mental Health Interventions   Mental Health Discussed/Reviewed Mental Health Discussed, Coping Strategies  Nutrition Interventions   Nutrition Discussed/Reviewed Nutrition Discussed, Supplmental nutrition  [G tube]  Pharmacy Interventions   Pharmacy Dicussed/Reviewed Pharmacy Topics Discussed, Pharmacy Topics Reviewed, Medications and their functions  Safety Interventions   Safety Discussed/Reviewed Safety Discussed          SDOH assessments and interventions completed:  No     Care Coordination Interventions:  Yes, provided   Follow up plan: Referral made to [place] to provide emotional support related to caregiver stress Follow up call scheduled for 02/24/23 @11 :00 AM    Encounter Outcome:  Pt. Visit Completed

## 2023-02-12 ENCOUNTER — Other Ambulatory Visit: Payer: Medicare Other

## 2023-02-12 DIAGNOSIS — Z515 Encounter for palliative care: Secondary | ICD-10-CM

## 2023-02-12 NOTE — Progress Notes (Signed)
PATIENT NAME: Lee Barnes DOB: Jun 09, 1951 MRN: [id number]  PRIMARY CARE PROVIDER: Denita Lung, MD  RESPONSIBLE PARTY:  Acct ID - Guarantor Home Phone Work Phone Relationship Acct Type  [id number] - [place],[name] [phone]  Self P/F     [place], [place], [place] [phone]   Palliative Care Follow Up Encounter Note   I connected with Burnett Corrente regarding  Teena Irani on 02/12/23 by telephone and verified that I am speaking with the correct person using two identifiers.     Respiratory: no SOB  Cognitive: alert and oriented; at times pt was difficult to understand    Appetite: uses a pump for tube feed; 100 ml/hr; pt reports he I snot hungry at this time  GI/GU: will see GI doctor next week; sometimes loose BM; no difficulties with bowel or bladder    Mobility: independent without assistive devices  ADLs: independent with grooming but needs assist from wife with all other ADLs    Sleeping Pattern: pt reports he doesn't sleep very well  Pain: denies pain at this time    Goals of Care: To stay in the home    CODE STATUS: Full Code ADVANCED DIRECTIVES: N MOST FORM: N    PHYSICAL EXAM:   VITALS:There were no vitals filed for this visit.    Next scheduled appt is 03/13/23 @ 1pm    Sylus Stgermain Georgann Housekeeper, LPN

## 2023-02-13 ENCOUNTER — Ambulatory Visit (INDEPENDENT_AMBULATORY_CARE_PROVIDER_SITE_OTHER): Payer: Medicare Other | Admitting: Family Medicine

## 2023-02-13 VITALS — BP 110/64 | HR 88 | Wt 163.2 lb

## 2023-02-13 DIAGNOSIS — Z8701 Personal history of pneumonia (recurrent): Secondary | ICD-10-CM

## 2023-02-13 DIAGNOSIS — E871 Hypo-osmolality and hyponatremia: Secondary | ICD-10-CM

## 2023-02-13 DIAGNOSIS — Z931 Gastrostomy status: Secondary | ICD-10-CM

## 2023-02-13 DIAGNOSIS — Z85819 Personal history of malignant neoplasm of unspecified site of lip, oral cavity, and pharynx: Secondary | ICD-10-CM | POA: Diagnosis not present

## 2023-02-13 NOTE — Progress Notes (Signed)
   Subjective:    Patient ID: Lee Barnes, male    DOB: June 06, 1951, 72 y.o.   MRN: [id number]  HPI He is here for a post hospitalization follow-up.  He was admitted again for aspiration pneumonia.  He does have a G-tube in place and has a previous history of oropharyngeal cancer.  He does try to keep his pulse ox above 92.  His wife is his main caregiver and she is very good at taking good care of him.  Since leaving the hospital he has done fairly well feeding his secretions are less thick and he is having slightly less coughing.  Occasionally he does see some blood tinged sputum but this does not stay constant.  He and his wife would like to have some physical therapy to help build up the strength.  He is already hooked up with hospice palliative care.  He has no other complaints.   Review of Systems     Objective:   Physical Exam Most recent hospital exam, discharge summary was reviewed. His voice is even more difficult to understand due to very hoarse nature.  Cardiac exam shows regular rhythm without murmurs gallop.  Lungs clear to auscultation.  Neck is supple without adenopathy.  Throat appears clear.      Assessment & Plan:  History of oropharyngeal cancer - Plan: CBC with Differential/Platelet, Comprehensive metabolic panel  G tube feedings (HCC)  History of aspiration pneumonia - Plan: CBC with Differential/Platelet, Comprehensive metabolic panel  Chronic hyponatremia - Plan: Comprehensive metabolic panel We will call hospice to see if they can send physical therapy out to help build up his strength.

## 2023-02-14 ENCOUNTER — Encounter: Payer: Self-pay | Admitting: Family Medicine

## 2023-02-14 LAB — CBC WITH DIFFERENTIAL/PLATELET
Basophils Absolute: 0.1 10*3/uL (ref 0.0–0.2)
Basos: 1 %
EOS (ABSOLUTE): 0.2 10*3/uL (ref 0.0–0.4)
Eos: 4 %
Hematocrit: 38 % (ref 37.5–51.0)
Hemoglobin: 12.1 g/dL — ABNORMAL LOW (ref 13.0–17.7)
Immature Grans (Abs): 0.1 10*3/uL (ref 0.0–0.1)
Immature Granulocytes: 1 %
Lymphocytes Absolute: 0.7 10*3/uL (ref 0.7–3.1)
Lymphs: 12 %
MCH: 27.8 pg (ref 26.6–33.0)
MCHC: 31.8 g/dL (ref 31.5–35.7)
MCV: 87 fL (ref 79–97)
Monocytes Absolute: 0.9 10*3/uL (ref 0.1–0.9)
Monocytes: 15 %
Neutrophils Absolute: 3.9 10*3/uL (ref 1.4–7.0)
Neutrophils: 67 %
Platelets: 430 10*3/uL (ref 150–450)
RBC: 4.35 x10E6/uL (ref 4.14–5.80)
RDW: 15.8 % — ABNORMAL HIGH (ref 11.6–15.4)
WBC: 5.8 10*3/uL (ref 3.4–10.8)

## 2023-02-14 LAB — COMPREHENSIVE METABOLIC PANEL
ALT: 22 IU/L (ref 0–44)
AST: 18 IU/L (ref 0–40)
Albumin/Globulin Ratio: 1.3 (ref 1.2–2.2)
Albumin: 4.3 g/dL (ref 3.8–4.8)
Alkaline Phosphatase: 138 IU/L — ABNORMAL HIGH (ref 44–121)
BUN/Creatinine Ratio: 26 — ABNORMAL HIGH (ref 10–24)
BUN: 38 mg/dL — ABNORMAL HIGH (ref 8–27)
Bilirubin Total: <0.2 mg/dL (ref 0.0–1.2)
CO2: 24 mmol/L (ref 20–29)
Calcium: 9.4 mg/dL (ref 8.6–10.2)
Chloride: 91 mmol/L — ABNORMAL LOW (ref 96–106)
Creatinine, Ser: 1.44 mg/dL — ABNORMAL HIGH (ref 0.76–1.27)
Globulin, Total: 3.3 g/dL (ref 1.5–4.5)
Glucose: 119 mg/dL — ABNORMAL HIGH (ref 70–99)
Potassium: 6.1 mmol/L — ABNORMAL HIGH (ref 3.5–5.2)
Sodium: 132 mmol/L — ABNORMAL LOW (ref 134–144)
Total Protein: 7.6 g/dL (ref 6.0–8.5)
eGFR: 52 mL/min/{1.73_m2} — ABNORMAL LOW (ref >59)

## 2023-02-17 ENCOUNTER — Ambulatory Visit: Payer: Self-pay | Admitting: Licensed Clinical Social Worker

## 2023-02-17 NOTE — Patient Instructions (Signed)
Visit Information  Thank you for taking time to visit with me today. Please don't hesitate to contact me if I can be of assistance to you.   Following are the goals we discussed today:   Goals Addressed             This Visit's Progress    Obtain Supportive Resources-Caregiver Fatigue   On track    Activities and task to complete in order to accomplish goals.   Keep all upcoming appointments discussed today Implement healthy coping skills discussed to assist with management of symptoms Continue working with [facility] care team to assist with goals identified Call father's PA to follow up on medical appt to discuss symptom management Follow up with Physical Therapy referral         Our next appointment is by telephone on 4/15 at 10 AM  Please call the care guide team at [phone] if you need to cancel or reschedule your appointment.   If you are experiencing a Mental Health or [place] or need someone to talk to, please call the Suicide and Crisis Lifeline: 988 call 911   Patient verbalizes understanding of instructions and care plan provided today and agrees to view in [place]. Active MyChart status and patient understanding of how to access instructions and care plan via MyChart confirmed with patient.     Christa See, MSW, [email or web address] Phone [phone] 4:42 PM

## 2023-02-17 NOTE — Patient Outreach (Signed)
  Care Coordination   Initial Visit Note   02/17/2023 Name: Lee Barnes MRN: [id number] DOB: [birth year]  Lee Barnes is a 72 y.o. year old male who sees Lee Lung, MD for primary care. I spoke with  Lee Barnes's spouse by phone today.  What matters to the patients health and wellness today?  Caregiver Fatigue    Goals Addressed             This Visit's Progress    Obtain Supportive Resources-Caregiver Fatigue   On track    Activities and task to complete in order to accomplish goals.   Keep all upcoming appointments discussed today Implement healthy coping skills discussed to assist with management of symptoms Continue working with [facility] care team to assist with goals identified Call father's PA to follow up on medical appt to discuss symptom management Follow up with Physical Therapy referral         SDOH assessments and interventions completed:  No     Care Coordination Interventions:  Yes, provided   Follow up plan: Follow up call scheduled for 2-4 weeks    Encounter Outcome:  Pt. Visit Completed   Lee Barnes, MSW, [email or web address] Phone [phone] 4:41 PM

## 2023-02-19 ENCOUNTER — Other Ambulatory Visit: Payer: Medicare Other

## 2023-02-19 DIAGNOSIS — Z931 Gastrostomy status: Secondary | ICD-10-CM | POA: Diagnosis not present

## 2023-02-19 DIAGNOSIS — D630 Anemia in neoplastic disease: Secondary | ICD-10-CM | POA: Diagnosis not present

## 2023-02-19 DIAGNOSIS — C44319 Basal cell carcinoma of skin of other parts of face: Secondary | ICD-10-CM | POA: Diagnosis not present

## 2023-02-19 DIAGNOSIS — R633 Feeding difficulties, unspecified: Secondary | ICD-10-CM | POA: Diagnosis not present

## 2023-02-19 DIAGNOSIS — J309 Allergic rhinitis, unspecified: Secondary | ICD-10-CM | POA: Diagnosis not present

## 2023-02-19 DIAGNOSIS — J9601 Acute respiratory failure with hypoxia: Secondary | ICD-10-CM | POA: Diagnosis not present

## 2023-02-19 DIAGNOSIS — N529 Male erectile dysfunction, unspecified: Secondary | ICD-10-CM | POA: Diagnosis not present

## 2023-02-19 DIAGNOSIS — Z85819 Personal history of malignant neoplasm of unspecified site of lip, oral cavity, and pharynx: Secondary | ICD-10-CM | POA: Diagnosis not present

## 2023-02-19 DIAGNOSIS — K5790 Diverticulosis of intestine, part unspecified, without perforation or abscess without bleeding: Secondary | ICD-10-CM | POA: Diagnosis not present

## 2023-02-19 DIAGNOSIS — R1310 Dysphagia, unspecified: Secondary | ICD-10-CM | POA: Diagnosis not present

## 2023-02-19 DIAGNOSIS — J690 Pneumonitis due to inhalation of food and vomit: Secondary | ICD-10-CM | POA: Diagnosis not present

## 2023-02-19 DIAGNOSIS — I959 Hypotension, unspecified: Secondary | ICD-10-CM | POA: Diagnosis not present

## 2023-02-19 DIAGNOSIS — I10 Essential (primary) hypertension: Secondary | ICD-10-CM | POA: Diagnosis not present

## 2023-02-19 DIAGNOSIS — K219 Gastro-esophageal reflux disease without esophagitis: Secondary | ICD-10-CM | POA: Diagnosis not present

## 2023-02-19 DIAGNOSIS — E871 Hypo-osmolality and hyponatremia: Secondary | ICD-10-CM | POA: Diagnosis not present

## 2023-02-19 DIAGNOSIS — E43 Unspecified severe protein-calorie malnutrition: Secondary | ICD-10-CM | POA: Diagnosis not present

## 2023-02-19 DIAGNOSIS — H9113 Presbycusis, bilateral: Secondary | ICD-10-CM | POA: Diagnosis not present

## 2023-02-19 DIAGNOSIS — G903 Multi-system degeneration of the autonomic nervous system: Secondary | ICD-10-CM | POA: Diagnosis not present

## 2023-02-19 LAB — COMPREHENSIVE METABOLIC PANEL
ALT: 17 IU/L (ref 0–44)
AST: 17 IU/L (ref 0–40)
Albumin/Globulin Ratio: 1.2 (ref 1.2–2.2)
Albumin: 4.2 g/dL (ref 3.8–4.8)
Alkaline Phosphatase: 143 IU/L — ABNORMAL HIGH (ref 44–121)
BUN/Creatinine Ratio: 21 (ref 10–24)
BUN: 27 mg/dL (ref 8–27)
Bilirubin Total: 0.2 mg/dL (ref 0.0–1.2)
CO2: 22 mmol/L (ref 20–29)
Calcium: 9.5 mg/dL (ref 8.6–10.2)
Chloride: 91 mmol/L — ABNORMAL LOW (ref 96–106)
Creatinine, Ser: 1.29 mg/dL — ABNORMAL HIGH (ref 0.76–1.27)
Globulin, Total: 3.4 g/dL (ref 1.5–4.5)
Glucose: 110 mg/dL — ABNORMAL HIGH (ref 70–99)
Potassium: 5 mmol/L (ref 3.5–5.2)
Sodium: 130 mmol/L — ABNORMAL LOW (ref 134–144)
Total Protein: 7.6 g/dL (ref 6.0–8.5)
eGFR: 59 mL/min/{1.73_m2} — ABNORMAL LOW (ref >59)

## 2023-02-21 DIAGNOSIS — E43 Unspecified severe protein-calorie malnutrition: Secondary | ICD-10-CM | POA: Diagnosis not present

## 2023-02-21 DIAGNOSIS — G903 Multi-system degeneration of the autonomic nervous system: Secondary | ICD-10-CM | POA: Diagnosis not present

## 2023-02-21 DIAGNOSIS — Z85819 Personal history of malignant neoplasm of unspecified site of lip, oral cavity, and pharynx: Secondary | ICD-10-CM | POA: Diagnosis not present

## 2023-02-21 DIAGNOSIS — J690 Pneumonitis due to inhalation of food and vomit: Secondary | ICD-10-CM | POA: Diagnosis not present

## 2023-02-21 DIAGNOSIS — Z931 Gastrostomy status: Secondary | ICD-10-CM | POA: Diagnosis not present

## 2023-02-21 DIAGNOSIS — J9601 Acute respiratory failure with hypoxia: Secondary | ICD-10-CM | POA: Diagnosis not present

## 2023-02-26 DIAGNOSIS — J9601 Acute respiratory failure with hypoxia: Secondary | ICD-10-CM | POA: Diagnosis not present

## 2023-02-26 DIAGNOSIS — E43 Unspecified severe protein-calorie malnutrition: Secondary | ICD-10-CM | POA: Diagnosis not present

## 2023-02-26 DIAGNOSIS — Z931 Gastrostomy status: Secondary | ICD-10-CM | POA: Diagnosis not present

## 2023-02-26 DIAGNOSIS — Z85819 Personal history of malignant neoplasm of unspecified site of lip, oral cavity, and pharynx: Secondary | ICD-10-CM | POA: Diagnosis not present

## 2023-02-26 DIAGNOSIS — J690 Pneumonitis due to inhalation of food and vomit: Secondary | ICD-10-CM | POA: Diagnosis not present

## 2023-02-26 DIAGNOSIS — G903 Multi-system degeneration of the autonomic nervous system: Secondary | ICD-10-CM | POA: Diagnosis not present

## 2023-02-28 ENCOUNTER — Encounter: Payer: Self-pay | Admitting: Family Medicine

## 2023-02-28 DIAGNOSIS — Z85819 Personal history of malignant neoplasm of unspecified site of lip, oral cavity, and pharynx: Secondary | ICD-10-CM | POA: Diagnosis not present

## 2023-02-28 DIAGNOSIS — J9601 Acute respiratory failure with hypoxia: Secondary | ICD-10-CM | POA: Diagnosis not present

## 2023-02-28 DIAGNOSIS — J690 Pneumonitis due to inhalation of food and vomit: Secondary | ICD-10-CM | POA: Diagnosis not present

## 2023-02-28 DIAGNOSIS — Z931 Gastrostomy status: Secondary | ICD-10-CM | POA: Diagnosis not present

## 2023-02-28 DIAGNOSIS — G903 Multi-system degeneration of the autonomic nervous system: Secondary | ICD-10-CM | POA: Diagnosis not present

## 2023-02-28 DIAGNOSIS — E43 Unspecified severe protein-calorie malnutrition: Secondary | ICD-10-CM | POA: Diagnosis not present

## 2023-03-03 ENCOUNTER — Ambulatory Visit: Payer: Self-pay | Admitting: Licensed Clinical Social Worker

## 2023-03-04 ENCOUNTER — Ambulatory Visit (INDEPENDENT_AMBULATORY_CARE_PROVIDER_SITE_OTHER): Payer: Medicare Other | Admitting: Family Medicine

## 2023-03-04 ENCOUNTER — Encounter: Payer: Self-pay | Admitting: Family Medicine

## 2023-03-04 VITALS — BP 136/84 | HR 88 | Temp 97.8°F | Resp 16 | Wt 167.8 lb

## 2023-03-04 DIAGNOSIS — Z85819 Personal history of malignant neoplasm of unspecified site of lip, oral cavity, and pharynx: Secondary | ICD-10-CM | POA: Diagnosis not present

## 2023-03-04 DIAGNOSIS — Z931 Gastrostomy status: Secondary | ICD-10-CM

## 2023-03-04 NOTE — Patient Outreach (Signed)
  Care Coordination   Follow Up Visit Note   03/03/2023 Name: Lee Barnes MRN: [id number] DOB: 1951-08-02  Lee Barnes is a 72 y.o. year old male who sees Ronnald Nian, MD for primary care. I spoke with  Prentice Docker Bernales's spouse by phone today.  What matters to the patients health and wellness today?  Symptom Management    Goals Addressed             This Visit's Progress    Obtain Supportive Resources-Caregiver Fatigue   On track    Activities and task to complete in order to accomplish goals.   Keep all upcoming appointments discussed today Implement healthy coping skills discussed to assist with management of symptoms Continue working with [facility] care team to assist with goals identified Call father's PA to follow up on medical appt to discuss symptom management Follow up with Physical Therapy referral         SDOH assessments and interventions completed:  No     Care Coordination Interventions:  Yes, provided  Interventions Today    Flowsheet Row Most Recent Value  Chronic Disease   Chronic disease during today's visit Hypertension (HTN), Other  [Hx of oropharyngeal cancer]  General Interventions   General Interventions Discussed/Reviewed General Interventions Reviewed  [Reviewed upcoming medical appts. No barriers to attending identified]  Doctor Visits Discussed/Reviewed Doctor Visits Reviewed  [PT visits]  Exercise Interventions   Exercise Discussed/Reviewed Exercise Reviewed  [Strategies discussed to promote motivation/memory to complete PT exercises routinely]  Mental Health Interventions   Mental Health Discussed/Reviewed Mental Health Reviewed, Coping Strategies  [Caregiver commended for continuing to encourage pt to advocate for self and communicate needs with providers. Focuses on progress and/or strengths]  Safety Interventions   Safety Discussed/Reviewed Fall Risk  [Caregiver shared strategies of utilizing night lights to reduce fall risk]        Follow up plan: Follow up call scheduled for 2-4 weeks    Encounter Outcome:  Pt. Visit Completed   Jenel Lucks, MSW, LCSW [facility] Care Management [facility]  [facility] [email or web address] Phone [phone] 10:34 AM

## 2023-03-04 NOTE — Patient Instructions (Signed)
Visit Information  Thank you for taking time to visit with me today. Please don't hesitate to contact me if I can be of assistance to you.   Following are the goals we discussed today:   Goals Addressed             This Visit's Progress    Obtain Supportive Resources-Caregiver Fatigue   On track    Activities and task to complete in order to accomplish goals.   Keep all upcoming appointments discussed today Implement healthy coping skills discussed to assist with management of symptoms Continue working with [facility] care team to assist with goals identified Call father's PA to follow up on medical appt to discuss symptom management Follow up with Physical Therapy referral         Our next appointment is by telephone on 5/13 at 10 AM  Please call the care guide team at [phone] if you need to cancel or reschedule your appointment.   If you are experiencing a Mental Health or Behavioral Health Crisis or need someone to talk to, please call the Suicide and Crisis Lifeline: 988 call 911   Patient verbalizes understanding of instructions and care plan provided today and agrees to view in MyChart. Active MyChart status and patient understanding of how to access instructions and care plan via MyChart confirmed with patient.     Jenel Lucks, MSW, LCSW [facility] Care Management [place]  [facility] [email or web address] Phone [phone] 10:35 AM

## 2023-03-04 NOTE — Progress Notes (Signed)
   Subjective:    Patient ID: Lee Barnes, male    DOB: 12-30-1950, 72 y.o.   MRN: [id number]  HPI He is here for consult.  He has had some PT but found that after being pushed by the therapist, he did have an episode of vomiting.  He does have previous history of difficulty with vomiting with relatively minimal stimuli.  In the past anything that would make him vomit would also make him more likely to aspirate.  Previous checks to the hospital indicate he seems respond very quickly to conservative care and his wife and I have worked out a deal that if he does have vomiting, she will keep track of his pulse ox and unless it stays below 88 for an extended period of time, they work on trying to keep him at home and not sending to the emergency room.  He has also been evaluated to have a new G-tube placed.  They are considering putting a different kind of tube and that would also put some of the fluids into the jejunum. He has also been seen by ENT and a referral was made to pulmonary outside the [facility] system.  They have been scheduled with pulmonary and has a PFT scheduled.  Review of Systems     Objective:   Physical Exam Alert and in no distress lungs are clear to auscultation.       Assessment & Plan:  G tube feedings  History of oropharyngeal cancer I had a good discussion with him concerning the tube feedings and think is reasonable to see if they can get a tube placed that would diminish the likelihood of aspiration of stomach contents. They will keep the appointment with pulmonary to do the PFTs but I explained it might be easier to then switch care over to our system to keep it in 1 medical system rather than spreading out over 2.  I explained that if there is a pulmonary issue we can certainly get one of our pulmonary doctors to follow-up.

## 2023-03-05 DIAGNOSIS — J9601 Acute respiratory failure with hypoxia: Secondary | ICD-10-CM | POA: Diagnosis not present

## 2023-03-05 DIAGNOSIS — G903 Multi-system degeneration of the autonomic nervous system: Secondary | ICD-10-CM | POA: Diagnosis not present

## 2023-03-05 DIAGNOSIS — Z931 Gastrostomy status: Secondary | ICD-10-CM | POA: Diagnosis not present

## 2023-03-05 DIAGNOSIS — E43 Unspecified severe protein-calorie malnutrition: Secondary | ICD-10-CM | POA: Diagnosis not present

## 2023-03-05 DIAGNOSIS — J690 Pneumonitis due to inhalation of food and vomit: Secondary | ICD-10-CM | POA: Diagnosis not present

## 2023-03-05 DIAGNOSIS — Z85819 Personal history of malignant neoplasm of unspecified site of lip, oral cavity, and pharynx: Secondary | ICD-10-CM | POA: Diagnosis not present

## 2023-03-06 ENCOUNTER — Ambulatory Visit: Payer: Self-pay

## 2023-03-06 NOTE — Patient Outreach (Signed)
  Care Coordination   03/06/2023 Name: Lee Barnes MRN: [id number] DOB: 1951-03-04   Care Coordination Outreach Attempts:  An unsuccessful telephone outreach was attempted for a scheduled appointment today.  Follow Up Plan:  Additional outreach attempts will be made to offer the patient care coordination information and services.   Encounter Outcome:  No Answer   Care Coordination Interventions:  No, not indicated    Delsa Sale, RN, BSN, CCM Care Management Coordinator [facility] Care Management  Direct Phone: [phone]

## 2023-03-07 DIAGNOSIS — Z931 Gastrostomy status: Secondary | ICD-10-CM | POA: Diagnosis not present

## 2023-03-07 DIAGNOSIS — J690 Pneumonitis due to inhalation of food and vomit: Secondary | ICD-10-CM | POA: Diagnosis not present

## 2023-03-07 DIAGNOSIS — G903 Multi-system degeneration of the autonomic nervous system: Secondary | ICD-10-CM | POA: Diagnosis not present

## 2023-03-07 DIAGNOSIS — E43 Unspecified severe protein-calorie malnutrition: Secondary | ICD-10-CM | POA: Diagnosis not present

## 2023-03-07 DIAGNOSIS — Z85819 Personal history of malignant neoplasm of unspecified site of lip, oral cavity, and pharynx: Secondary | ICD-10-CM | POA: Diagnosis not present

## 2023-03-07 DIAGNOSIS — J9601 Acute respiratory failure with hypoxia: Secondary | ICD-10-CM | POA: Diagnosis not present

## 2023-03-10 ENCOUNTER — Other Ambulatory Visit (HOSPITAL_COMMUNITY): Payer: Self-pay

## 2023-03-10 ENCOUNTER — Telehealth (INDEPENDENT_AMBULATORY_CARE_PROVIDER_SITE_OTHER): Payer: Medicare Other | Admitting: Family Medicine

## 2023-03-10 ENCOUNTER — Encounter: Payer: Self-pay | Admitting: Family Medicine

## 2023-03-10 VITALS — Ht 72.0 in | Wt 167.0 lb

## 2023-03-10 DIAGNOSIS — Z85819 Personal history of malignant neoplasm of unspecified site of lip, oral cavity, and pharynx: Secondary | ICD-10-CM

## 2023-03-10 DIAGNOSIS — J690 Pneumonitis due to inhalation of food and vomit: Secondary | ICD-10-CM

## 2023-03-10 DIAGNOSIS — Z8701 Personal history of pneumonia (recurrent): Secondary | ICD-10-CM | POA: Diagnosis not present

## 2023-03-10 DIAGNOSIS — Z931 Gastrostomy status: Secondary | ICD-10-CM

## 2023-03-10 MED ORDER — SULFAMETHOXAZOLE-TRIMETHOPRIM 200-40 MG/5ML PO SUSP
20.0000 mL | Freq: Two times a day (BID) | ORAL | 0 refills | Status: DC
  Filled 2023-03-10: qty 200, 5d supply, fill #0

## 2023-03-10 NOTE — Progress Notes (Signed)
   Subjective:    Patient ID: Lee Barnes, male    DOB: [birth year], 72 y.o.   MRN: [id number]  HPI Documentation for virtual audio and video telecommunications through Caregility encounter: The patient was located at home. 2 patient identifiers used.  The provider was located in the office. The patient did consent to this visit and is aware of possible charges through their insurance for this visit. The other persons participating in this telemedicine service was his wife Time spent on call was 10 minutes and in review of previous records >20 minutes total for counseling and coordination of care. This virtual service is not related to other E/M service within previous 7 days.  I received 2 phone calls earlier this morning concerning his symptoms below.  He continues on tube feedings.  Review of the record indicates that he has had 5 admissions within the last year for aspiration pneumonia.  He woke up this morning and did feel hot.  He had diarrhea which she has had difficulty with in the past.  Apparently last night he vomited on 3 separate occasions usually revolving around coughing.  He subsequently had some chills.  After the vomiting his wife is monitoring his pulse ox and he did drop down to 85.  She then gave him 5 L/min of oxygen which was reduced to 2 L and this kept his pulse ox above 90.  Today's temperature was 99.4.  Review of Systems     Objective:   Physical Exam Alert and he appears in no distress.  His voice is unchanged. The previous discharge summaries was reviewed.      Assessment & Plan:  Acute aspiration pneumonia - Plan: sulfamethoxazole-trimethoprim (BACTRIM) 200-40 MG/5ML suspension  G tube feedings  History of oropharyngeal cancer  History of aspiration pneumonia Under the present circumstances I think it is appropriate to keep his pulse ox above 90 and keep him on 2 L/min.  She will start to tube feed him again.  I will place him on Septra after review  of the record indicates this is a good choice for the previous culture results.  They are to keep in touch with me and call me in 3 days to let me know how he is doing.  If he gets worse he will need to go to the hospital.  They are in agreement to this.

## 2023-03-12 ENCOUNTER — Encounter: Payer: Self-pay | Admitting: Family Medicine

## 2023-03-13 ENCOUNTER — Other Ambulatory Visit: Payer: Medicare Other

## 2023-03-13 ENCOUNTER — Other Ambulatory Visit (HOSPITAL_COMMUNITY): Payer: Self-pay | Admitting: Gastroenterology

## 2023-03-13 VITALS — BP 112/66 | HR 88 | Temp 99.4°F

## 2023-03-13 DIAGNOSIS — Z515 Encounter for palliative care: Secondary | ICD-10-CM

## 2023-03-13 DIAGNOSIS — R633 Feeding difficulties, unspecified: Secondary | ICD-10-CM

## 2023-03-13 NOTE — Progress Notes (Signed)
PATIENT NAME: Lee Barnes DOB: [birth year] MRN: [id number]  PRIMARY CARE PROVIDER: Ronnald Nian, MD  RESPONSIBLE PARTY:  Acct ID - Guarantor Home Phone Work Phone Relationship Acct Type  [id number] - [name],[name] [phone]  Self P/F     [address], [place], [place] [id number]   Palliative Care Follow Up Encounter Note    Completed home visit. Wife [name] also present.    Today's Visit:  Respiratory: no SOB   Cognitive: alert and oriented; at times pt was difficult to understand    Appetite: uses a pump for tube feed; supposed to take 100 ml/hr but right now he is on 80 ml/hr; pt has been feeling bloated so they cut back on the rate which has decreased the intake   GI/GU: sometimes has loose BMs and manages it with Imodium;  no difficulties with bowel or bladder; it's time for a feeding tube change    Mobility: independent without assistive devices   ADLs: independent with bathing, dressing and grooming but needs assist from wife with all other ADLs    Sleeping Pattern: pt reports he sleeps through the night but get up frequently to go to the bathroom; he is able to go back to sleep   Pain: denies pain at this time   Fall: last fall on 02/24/23; no falls since that time   Goals of Care: To stay in the home with his wife      CODE STATUS: Full Code ADVANCED DIRECTIVES: N MOST FORM: N PPS: 70%   PHYSICAL EXAM:   VITALS: Today's Vitals   03/13/23 1315  BP: 112/66  Pulse: 88  Temp: 99.4 F (37.4 C)  TempSrc: Temporal  SpO2: 94%  PainSc: 0-No pain    LUNGS: clear to auscultation  CARDIAC: Cor RRR EXTREMITIES: AROM x4 SKIN: Skin color, texture, turgor normal. No rashes or lesions or ecchymoses - lower leg(s) left  NEURO: negative    Next scheduled visit: Apr 10, 2023 @ 12pm   Stefani Baik Clementeen [place], LPN

## 2023-03-18 DIAGNOSIS — J690 Pneumonitis due to inhalation of food and vomit: Secondary | ICD-10-CM | POA: Diagnosis not present

## 2023-03-18 DIAGNOSIS — E43 Unspecified severe protein-calorie malnutrition: Secondary | ICD-10-CM | POA: Diagnosis not present

## 2023-03-18 DIAGNOSIS — G903 Multi-system degeneration of the autonomic nervous system: Secondary | ICD-10-CM | POA: Diagnosis not present

## 2023-03-18 DIAGNOSIS — J9601 Acute respiratory failure with hypoxia: Secondary | ICD-10-CM | POA: Diagnosis not present

## 2023-03-18 DIAGNOSIS — Z85819 Personal history of malignant neoplasm of unspecified site of lip, oral cavity, and pharynx: Secondary | ICD-10-CM | POA: Diagnosis not present

## 2023-03-18 DIAGNOSIS — Z931 Gastrostomy status: Secondary | ICD-10-CM | POA: Diagnosis not present

## 2023-03-19 ENCOUNTER — Ambulatory Visit (HOSPITAL_COMMUNITY)
Admission: RE | Admit: 2023-03-19 | Discharge: 2023-03-19 | Disposition: A | Discharge status: Home / Self Care | Payer: Medicare Other | Source: Ambulatory Visit | Attending: Gastroenterology | Admitting: Gastroenterology

## 2023-03-19 DIAGNOSIS — R633 Feeding difficulties, unspecified: Secondary | ICD-10-CM | POA: Diagnosis not present

## 2023-03-19 DIAGNOSIS — Z431 Encounter for attention to gastrostomy: Secondary | ICD-10-CM | POA: Insufficient documentation

## 2023-03-19 DIAGNOSIS — J690 Pneumonitis due to inhalation of food and vomit: Secondary | ICD-10-CM | POA: Diagnosis not present

## 2023-03-19 HISTORY — PX: IR REPLC GASTRO/COLONIC TUBE PERCUT W/FLUORO: IMG2333

## 2023-03-19 MED ORDER — LIDOCAINE VISCOUS HCL 2 % MT SOLN
OROMUCOSAL | Status: AC
  Filled 2023-03-19: qty 15

## 2023-03-19 MED ORDER — IOHEXOL 300 MG/ML  SOLN
50.0000 mL | Freq: Once | INTRAMUSCULAR | Status: AC | PRN
  Administered 2023-03-19: 20 mL

## 2023-03-19 NOTE — Procedures (Signed)
Pre procedural Dx: Dysphagia, poorly functioning feeding tube. Post procedural Dx: Same  Successful fluoroscopic guided conversion of existing G tube to 22 Fr GJ tube. Both lumens of the GJ tube are ready for immediate use.  EBL: None  Complications: None immediate.  Katherina Right, MD Pager #: [phone]

## 2023-03-20 ENCOUNTER — Telehealth (INDEPENDENT_AMBULATORY_CARE_PROVIDER_SITE_OTHER): Payer: Medicare Other | Admitting: Family Medicine

## 2023-03-20 ENCOUNTER — Encounter: Payer: Self-pay | Admitting: Family Medicine

## 2023-03-20 VITALS — Temp 98.2°F

## 2023-03-20 DIAGNOSIS — Z931 Gastrostomy status: Secondary | ICD-10-CM | POA: Diagnosis not present

## 2023-03-20 NOTE — Progress Notes (Signed)
   Subjective:    Patient ID: Lee Barnes, male    DOB: [birth year], 72 y.o.   MRN: [id number]  HPI Documentation for virtual audio and video telecommunications through Caregility encounter: The patient was located at home. 2 patient identifiers used.  The provider was located in the office. The patient did consent to this visit and is aware of possible charges through their insurance for this visit. The other persons participating in this telemedicine service were none. Time spent on call was 5 minutes and in review of previous records >14 minutes total for counseling and coordination of care. This virtual service is not related to other E/M service within previous 7 days.  Yesterday he was started on a GJ tube and his feedings were held for 6 to 8 hours.  After that he did have a couple episodes of coughing and vomiting.  His wife has decreased his feedings to 40 cc and has given him Zofran today.  Right now he has not had any vomiting and states he is breathing better.  His pulse ox is 95 on 2 L.  Review of Systems     Objective:   Physical Exam Alert and in no distress otherwise not examined       Assessment & Plan:  G tube feedings (HCC) I think that he is holding his own now and I do not think he is having evidence of aspiration pneumonia.  He and his wife agree.  She will try to lower the oxygen to room air if possible but try to keep his pulse ox 90 or above.  She will slowly increase her the feedings from 40 cc up to 60-80 and see how he tolerates this.

## 2023-03-21 DIAGNOSIS — R1310 Dysphagia, unspecified: Secondary | ICD-10-CM | POA: Diagnosis not present

## 2023-03-21 DIAGNOSIS — Z85819 Personal history of malignant neoplasm of unspecified site of lip, oral cavity, and pharynx: Secondary | ICD-10-CM | POA: Diagnosis not present

## 2023-03-21 DIAGNOSIS — J309 Allergic rhinitis, unspecified: Secondary | ICD-10-CM | POA: Diagnosis not present

## 2023-03-21 DIAGNOSIS — H9113 Presbycusis, bilateral: Secondary | ICD-10-CM | POA: Diagnosis not present

## 2023-03-21 DIAGNOSIS — D630 Anemia in neoplastic disease: Secondary | ICD-10-CM | POA: Diagnosis not present

## 2023-03-21 DIAGNOSIS — J690 Pneumonitis due to inhalation of food and vomit: Secondary | ICD-10-CM | POA: Diagnosis not present

## 2023-03-21 DIAGNOSIS — J9601 Acute respiratory failure with hypoxia: Secondary | ICD-10-CM | POA: Diagnosis not present

## 2023-03-21 DIAGNOSIS — I959 Hypotension, unspecified: Secondary | ICD-10-CM | POA: Diagnosis not present

## 2023-03-21 DIAGNOSIS — I10 Essential (primary) hypertension: Secondary | ICD-10-CM | POA: Diagnosis not present

## 2023-03-21 DIAGNOSIS — C44319 Basal cell carcinoma of skin of other parts of face: Secondary | ICD-10-CM | POA: Diagnosis not present

## 2023-03-21 DIAGNOSIS — K5790 Diverticulosis of intestine, part unspecified, without perforation or abscess without bleeding: Secondary | ICD-10-CM | POA: Diagnosis not present

## 2023-03-21 DIAGNOSIS — Z931 Gastrostomy status: Secondary | ICD-10-CM | POA: Diagnosis not present

## 2023-03-21 DIAGNOSIS — G903 Multi-system degeneration of the autonomic nervous system: Secondary | ICD-10-CM | POA: Diagnosis not present

## 2023-03-21 DIAGNOSIS — N529 Male erectile dysfunction, unspecified: Secondary | ICD-10-CM | POA: Diagnosis not present

## 2023-03-21 DIAGNOSIS — E43 Unspecified severe protein-calorie malnutrition: Secondary | ICD-10-CM | POA: Diagnosis not present

## 2023-03-21 DIAGNOSIS — K219 Gastro-esophageal reflux disease without esophagitis: Secondary | ICD-10-CM | POA: Diagnosis not present

## 2023-03-26 ENCOUNTER — Ambulatory Visit (INDEPENDENT_AMBULATORY_CARE_PROVIDER_SITE_OTHER): Payer: Medicare Other | Admitting: Family Medicine

## 2023-03-26 ENCOUNTER — Ambulatory Visit
Admission: RE | Admit: 2023-03-26 | Discharge: 2023-03-26 | Disposition: A | Discharge status: Home / Self Care | Payer: Medicare Other | Source: Ambulatory Visit | Attending: Family Medicine | Admitting: Family Medicine

## 2023-03-26 ENCOUNTER — Other Ambulatory Visit (HOSPITAL_COMMUNITY): Payer: Self-pay

## 2023-03-26 ENCOUNTER — Encounter: Payer: Self-pay | Admitting: Family Medicine

## 2023-03-26 ENCOUNTER — Other Ambulatory Visit: Payer: Self-pay | Admitting: Family Medicine

## 2023-03-26 ENCOUNTER — Telehealth: Payer: Self-pay

## 2023-03-26 VITALS — BP 126/88 | HR 85 | Temp 97.7°F | Resp 18 | Wt 167.2 lb

## 2023-03-26 DIAGNOSIS — Z789 Other specified health status: Secondary | ICD-10-CM

## 2023-03-26 DIAGNOSIS — Z8701 Personal history of pneumonia (recurrent): Secondary | ICD-10-CM

## 2023-03-26 DIAGNOSIS — J690 Pneumonitis due to inhalation of food and vomit: Secondary | ICD-10-CM | POA: Diagnosis not present

## 2023-03-26 LAB — CBC WITH DIFFERENTIAL/PLATELET
Basophils Absolute: 0 10*3/uL (ref 0.0–0.2)
Basos: 0 %
EOS (ABSOLUTE): 0.2 10*3/uL (ref 0.0–0.4)
Eos: 3 %
Hematocrit: 41 % (ref 37.5–51.0)
Hemoglobin: 12.9 g/dL — ABNORMAL LOW (ref 13.0–17.7)
Immature Grans (Abs): 0 10*3/uL (ref 0.0–0.1)
Immature Granulocytes: 0 %
Lymphocytes Absolute: 0.6 10*3/uL — ABNORMAL LOW (ref 0.7–3.1)
Lymphs: 12 %
MCH: 27.9 pg (ref 26.6–33.0)
MCHC: 31.5 g/dL (ref 31.5–35.7)
MCV: 89 fL (ref 79–97)
Monocytes Absolute: 0.7 10*3/uL (ref 0.1–0.9)
Monocytes: 14 %
Neutrophils Absolute: 3.7 10*3/uL (ref 1.4–7.0)
Neutrophils: 71 %
Platelets: 296 10*3/uL (ref 150–450)
RBC: 4.63 x10E6/uL (ref 4.14–5.80)
RDW: 15.7 % — ABNORMAL HIGH (ref 11.6–15.4)
WBC: 5.2 10*3/uL (ref 3.4–10.8)

## 2023-03-26 LAB — COMPREHENSIVE METABOLIC PANEL
ALT: 19 IU/L (ref 0–44)
AST: 18 IU/L (ref 0–40)
Albumin/Globulin Ratio: 1.3 (ref 1.2–2.2)
Albumin: 4.2 g/dL (ref 3.8–4.8)
Alkaline Phosphatase: 142 IU/L — ABNORMAL HIGH (ref 44–121)
BUN/Creatinine Ratio: 28 — ABNORMAL HIGH (ref 10–24)
BUN: 29 mg/dL — ABNORMAL HIGH (ref 8–27)
Bilirubin Total: <0.2 mg/dL (ref 0.0–1.2)
CO2: 22 mmol/L (ref 20–29)
Calcium: 9.8 mg/dL (ref 8.6–10.2)
Chloride: 93 mmol/L — ABNORMAL LOW (ref 96–106)
Creatinine, Ser: 1.02 mg/dL (ref 0.76–1.27)
Globulin, Total: 3.3 g/dL (ref 1.5–4.5)
Glucose: 91 mg/dL (ref 70–99)
Potassium: 5.6 mmol/L — ABNORMAL HIGH (ref 3.5–5.2)
Sodium: 134 mmol/L (ref 134–144)
Total Protein: 7.5 g/dL (ref 6.0–8.5)
eGFR: 79 mL/min/{1.73_m2} (ref >59)

## 2023-03-26 MED ORDER — OMEPRAZOLE 2 MG/ML ORAL SUSPENSION
10.0000 mg | Freq: Every day | ORAL | 5 refills | Status: DC
  Filled 2023-03-26: qty 300, 60d supply, fill #0

## 2023-03-26 NOTE — Progress Notes (Signed)
   Subjective:    Patient ID: Lee Barnes, male    DOB: [birth year], 72 y.o.   MRN: [id number]  HPI He is now using a G- J tube to help with feedings his wife is trying to keep the fluid status in a good range.  His urine is coming out light yellow.  He still is having difficulty with malaise and occasional nausea and vomiting.  She has been using ondansetron to help with that.  Presently he is having no shortness of breath fever or chills but has had some diaphoresis. She has been trying to use Prilosec granules but they are difficult to get through the tube.  Review of Systems     Objective:   Physical Exam Alert and in no distress.  Breathing pattern is normal.  Lungs show decreased breath sounds on the right.  Cardiac exam shows a regular rhythm without murmurs or gallops.       Assessment & Plan:  History of aspiration pneumonia - Plan: DG Chest 2 View, CBC with Differential/Platelet, Comprehensive metabolic panel  On tube feeding diet - Plan: omeprazole (KONVOMEP) 2 mg/mL SUSP oral suspension So far his wife and I have been able to keep the nausea vomiting and possible aspiration to a minimum.  His pulse ox numbers at home have been fairly good.

## 2023-03-26 NOTE — Telephone Encounter (Signed)
I called and spoke with pharmacist. She informed me that none of the oral suspensions are covered. Pharmacist says, another option you could try is ordering the Omeprazole capsules. Pharmacist states that the capsules have small beads in them. Patient could open the capsule and dissolve it in water, then administer it in his feeding tube.

## 2023-03-26 NOTE — Telephone Encounter (Signed)
Spoke with wife, Luster Landsberg. She called regarding the results for [facility]. She's wanting to know if he has pneumonia and what should they do for treatment if he does?

## 2023-03-26 NOTE — Telephone Encounter (Signed)
Please see message from pharmacy:  Pharmacy comment: not covered please change to something else

## 2023-03-27 DIAGNOSIS — J690 Pneumonitis due to inhalation of food and vomit: Secondary | ICD-10-CM | POA: Diagnosis not present

## 2023-03-27 DIAGNOSIS — Z85819 Personal history of malignant neoplasm of unspecified site of lip, oral cavity, and pharynx: Secondary | ICD-10-CM | POA: Diagnosis not present

## 2023-03-27 DIAGNOSIS — Z931 Gastrostomy status: Secondary | ICD-10-CM | POA: Diagnosis not present

## 2023-03-27 DIAGNOSIS — J9601 Acute respiratory failure with hypoxia: Secondary | ICD-10-CM | POA: Diagnosis not present

## 2023-03-27 DIAGNOSIS — G903 Multi-system degeneration of the autonomic nervous system: Secondary | ICD-10-CM | POA: Diagnosis not present

## 2023-03-27 DIAGNOSIS — E43 Unspecified severe protein-calorie malnutrition: Secondary | ICD-10-CM | POA: Diagnosis not present

## 2023-03-27 NOTE — Telephone Encounter (Signed)
Spoke with wife. She advised she has tried dissolving the tablets, however, there are beads within the tabs that don't dissolve. She stated she is okay with not getting the liquid omeprazole filled since it's not covered.

## 2023-03-31 ENCOUNTER — Ambulatory Visit: Payer: Self-pay | Admitting: Licensed Clinical Social Worker

## 2023-04-01 NOTE — Patient Instructions (Signed)
Visit Information  Thank you for taking time to visit with me today. Please don't hesitate to contact me if I can be of assistance to you.   Following are the goals we discussed today:   Goals Addressed             This Visit's Progress    Obtain Supportive Resources-Caregiver Fatigue   On track    Activities and task to complete in order to accomplish goals.   Keep all upcoming appointments discussed today Implement healthy coping skills discussed to assist with management of symptoms Continue working with [facility] care team to assist with goals identified         Our next appointment is by telephone on 6/10 at 10 AM  Please call the care guide team at [phone] if you need to cancel or reschedule your appointment.   If you are experiencing a Mental Health or Behavioral Health Crisis or need someone to talk to, please call the Suicide and Crisis Lifeline: 988 call 911   Patient verbalizes understanding of instructions and care plan provided today and agrees to view in MyChart. Active MyChart status and patient understanding of how to access instructions and care plan via MyChart confirmed with patient.     Jenel Lucks, MSW, LCSW [facility] Care Management [place]  [facility] [email or web address] Phone [phone] 5:26 PM

## 2023-04-01 NOTE — Patient Outreach (Signed)
  Care Coordination   Follow Up Visit Note   03/31/2023 Name: Lee Barnes MRN: [id number] DOB: 1951-01-17  Lee Barnes is a 72 y.o. year old male who sees Lee Nian, MD for primary care. I spoke with  Lee Barnes's spouse by phone today.  What matters to the patients health and wellness today?  Medication, Caregiver Stress, DME    Goals Addressed             This Visit's Progress    Obtain Supportive Resources-Caregiver Fatigue   On track    Activities and task to complete in order to accomplish goals.   Keep all upcoming appointments discussed today Implement healthy coping skills discussed to assist with management of symptoms Continue working with [facility] care team to assist with goals identified         SDOH assessments and interventions completed:  No     Care Coordination Interventions:  Yes, provided  Interventions Today    Flowsheet Row Most Recent Value  Chronic Disease   Chronic disease during today's visit Hypertension (HTN), Other  [Hx of oropharyngeal cancer,  G tube feedings]  General Interventions   General Interventions Discussed/Reviewed General Interventions Reviewed, Doctor Visits  [LCSW provided safe environment for pt's spouse to discuss patient care barriers (medication and oxygen needs)]  Doctor Visits Discussed/Reviewed Doctor Visits Discussed  Mental Health Interventions   Mental Health Discussed/Reviewed Mental Health Reviewed, Coping Strategies, Other  [Triggers that negatively impact caregiver stress identified, strategies to assist in management of symptoms discussed. Family is interested in taking a vacation, pt's spouse discussed needs. Validation and encouragement provided]  Nutrition Interventions   Nutrition Discussed/Reviewed Nutrition Discussed  Pharmacy Interventions   Pharmacy Dicussed/Reviewed Pharmacy Topics Discussed  Safety Interventions   Safety Discussed/Reviewed Safety Discussed       Follow up plan:  Follow up call scheduled for 2-4 weeks    Encounter Outcome:  Pt. Visit Completed   Lee Barnes, MSW, LCSW [facility] Care Management [facility] Health  [facility] [email or web address] Phone [phone] 5:26 PM

## 2023-04-02 ENCOUNTER — Encounter: Payer: Self-pay | Admitting: Licensed Clinical Social Worker

## 2023-04-02 DIAGNOSIS — J9601 Acute respiratory failure with hypoxia: Secondary | ICD-10-CM | POA: Diagnosis not present

## 2023-04-02 DIAGNOSIS — Z931 Gastrostomy status: Secondary | ICD-10-CM | POA: Diagnosis not present

## 2023-04-02 DIAGNOSIS — J690 Pneumonitis due to inhalation of food and vomit: Secondary | ICD-10-CM | POA: Diagnosis not present

## 2023-04-02 DIAGNOSIS — E43 Unspecified severe protein-calorie malnutrition: Secondary | ICD-10-CM | POA: Diagnosis not present

## 2023-04-02 DIAGNOSIS — Z85819 Personal history of malignant neoplasm of unspecified site of lip, oral cavity, and pharynx: Secondary | ICD-10-CM | POA: Diagnosis not present

## 2023-04-02 DIAGNOSIS — G903 Multi-system degeneration of the autonomic nervous system: Secondary | ICD-10-CM | POA: Diagnosis not present

## 2023-04-03 NOTE — Patient Outreach (Signed)
  Care Coordination   Multidisciplinary Case Review Note    04/02/2023 Name: Lee Barnes MRN: [id number] DOB: 04-18-1951  Lee Barnes is a 72 y.o. year old male who sees Ronnald Nian, MD for primary care.  The  multidisciplinary care team met today to review patient care needs and barriers.    Care Coordination Interventions: Multidisciplinary case discussion to review patient ongoing care coordination needs  LCSW referred to [facility] for patient to be engaged by [facility] Manager Lawanna Kobus Little for disease management. LCSW will continue to provide social support needs  SDOH assessments and interventions completed:  No     Care Coordination Interventions Activated:  Yes   Care Coordination Interventions:  Yes, provided  Interventions Today    Flowsheet Row Most Recent Value  Chronic Disease   Chronic disease during today's visit Hypertension (HTN)  General Interventions   General Interventions Discussed/Reviewed General Interventions Reviewed, Communication with  Communication with RN, Social Work       Follow up plan: Follow up call scheduled for 2-4 weeks    Multidisciplinary Team Attendees:   Delsa Sale, RN Bevelyn Ngo, BSW Jenel Lucks, LCSW  Scribe for Multidisciplinary Case Review:   Jenel Lucks, MSW, LCSW [facility] Care Management [facility] Health  [facility] [email or web address]

## 2023-04-04 ENCOUNTER — Ambulatory Visit: Payer: Self-pay

## 2023-04-04 DIAGNOSIS — J690 Pneumonitis due to inhalation of food and vomit: Secondary | ICD-10-CM

## 2023-04-04 DIAGNOSIS — R1310 Dysphagia, unspecified: Secondary | ICD-10-CM

## 2023-04-04 DIAGNOSIS — I10 Essential (primary) hypertension: Secondary | ICD-10-CM

## 2023-04-04 NOTE — Patient Outreach (Signed)
Care Coordination   Follow Up Visit Note   04/04/2023 Name: Lee Barnes MRN: [id number] DOB: 03-06-1951  Lee Barnes is a 72 y.o. year old male who sees Ronnald Nian, MD for primary care. I spoke with  Lee Barnes by phone today.  What matters to the patients health and wellness today?  Patient [place] the beach if symptoms remain stable.     Goals Addressed             This Visit's Progress    Avoid Nutritional Imbalance       Care Coordination Interventions: Completed successful outbound call with [name] Evaluation of current treatment plan related to nutritional imbalance  and patient's adherence to plan as established by provider Determined patient is gaining weight since getting G tube replaced with G-J tube and adjusting rate and amount of tube feedings  Encouraged wife to continue to weight patient and report concerns or weight loss to his doctor  Sent Pharm D referral to assist with getting PA for Omeprazole oral suspension for G-J tube       No complications from aspirational pnemonia       Care Coordination Interventions: Completed successful outbound call with [name] Evaluation of current treatment plan related to aspirational pneumonia  and patient's adherence to plan as established by provider Reviewed and discussed with [name] patient underwent a G tube replacement with a G-J tube placement, he is tolerating the new tube better and after adjusting his amount of tube feeds, wife states he is not vomiting  Review of patient status, including review of consultant's reports, relevant laboratory and other test results, and medications completed Determined insurance denied Omeprazole oral suspension and the capsule granula's clog up his tube Placed pharmacy referral to [id number] requesting assistance with PA for Omeprazole oral suspension   Determined wife received patient's recent chest xray report per my chart and this  impression does not indicate pneumonia Discussed wife and patient are considering traveling to [facility] in the near future if patient remains stable  Discussed the need for portable Oxygen and discussed options with wife, determined she will contact [facility], current provider and ask if she can rent additional portable tanks and or she will contact a provider near [facility] to inquire about rental options Encouraged wife to contact Medicare member services and request to speak with a CM regarding patient's Oxygen usage and medical necessity in hopes to get guidance from the health plan on how to get Oxygen authorized through insurance     Interventions Today    Flowsheet Row Most Recent Value  Chronic Disease   Chronic disease during today's visit Other  [Nutritional Imbalance,  G-J tube]  General Interventions   General Interventions Discussed/Reviewed General Interventions Discussed, General Interventions Reviewed, Doctor Visits, Durable Medical Equipment (DME), Communication with  Doctor Visits Discussed/Reviewed Doctor Visits Discussed, Doctor Visits Reviewed, PCP, Specialist  Durable Medical Equipment (DME) Oxygen  Communication with Pharmacists  [Sent [phone] pharmacy referral to assist with PA for Omeprazole oral suspension]  Education Interventions   Education Provided Provided Education  Provided Verbal Education On When to see the doctor, Nutrition  Nutrition Interventions   Nutrition Discussed/Reviewed Nutrition Discussed, Nutrition Reviewed, Supplmental nutrition  [G-J tube]  Pharmacy Interventions   Pharmacy Dicussed/Reviewed Pharmacy Topics Discussed, Pharmacy Topics Reviewed, Medications and their functions, Medication Adherence, Referral to Pharmacist  Medication Adherence Not taking medication, Unable to refill medication  SDOH assessments and interventions completed:  No     Care Coordination Interventions:  Yes, provided   Follow up  plan: Referral made to [facility] team to assist wit h PA for prescribed medication  Follow up call scheduled for 05/05/23 @11 :15 AM    Encounter Outcome:  Pt. Visit Completed

## 2023-04-04 NOTE — Patient Instructions (Signed)
Visit Information  Thank you for taking time to visit with me today. Please don't hesitate to contact me if I can be of assistance to you.   Following are the goals we discussed today:   Goals Addressed             This Visit's Progress    Avoid Nutritional Imbalance       Care Coordination Interventions: Completed successful outbound call with [name] Evaluation of current treatment plan related to nutritional imbalance  and patient's adherence to plan as established by provider Determined patient is gaining weight since getting G tube replaced with G-J tube and adjusting rate and amount of tube feedings  Encouraged wife to continue to weight patient and report concerns or weight loss to his doctor  Sent Pharm D referral to assist with getting PA for Omeprazole oral suspension for G-J tube       No complications from aspirational pnemonia       Care Coordination Interventions: Completed successful outbound call with [name] Evaluation of current treatment plan related to aspirational pneumonia  and patient's adherence to plan as established by provider Reviewed and discussed with [name] patient underwent a G tube replacement with a G-J tube placement, he is tolerating the new tube better and after adjusting his amount of tube feeds, wife states he is not vomiting  Review of patient status, including review of consultant's reports, relevant laboratory and other test results, and medications completed Determined insurance denied Omeprazole oral suspension and the capsule granula's clog up his tube Placed pharmacy referral to [id number] requesting assistance with PA for Omeprazole oral suspension   Determined wife received patient's recent chest xray report per my chart and this impression does not indicate pneumonia Discussed wife and patient are considering traveling to [place] in the near future if patient remains stable  Discussed the need for portable Oxygen and  discussed options with wife, determined she will contact [facility], current provider and ask if she can rent additional portable tanks and or she will contact a provider near [facility] to inquire about rental options Encouraged wife to contact Medicare member services and request to speak with a CM regarding patient's Oxygen usage and medical necessity in hopes to get guidance from the health plan on how to get Oxygen authorized through insurance         Our next appointment is by telephone on 05/05/23 at 11:15 AM  Please call the care guide team at [phone] if you need to cancel or reschedule your appointment.   If you are experiencing a Mental Health or Behavioral Health Crisis or need someone to talk to, please call 1-800-273-TALK (toll free, 24 hour hotline) go to [facility] [address] [place] [phone])  Patient verbalizes understanding of instructions and care plan provided today and agrees to view in MyChart. Active MyChart status and patient understanding of how to access instructions and care plan via MyChart confirmed with patient.     Delsa Sale, RN, BSN, CCM Care Management Coordinator [facility] Care Management  Direct Phone: [phone]

## 2023-04-07 ENCOUNTER — Telehealth: Payer: Self-pay

## 2023-04-07 NOTE — Progress Notes (Unsigned)
Patient ID: AMOD LIQUORI, male   DOB: 1950/12/23, 72 y.o.   MRN: [id number]  Care Management & Coordination Services Pharmacy Team  Reason for Encounter: Chart Prep for initial encounter with Johny Drilling D on 04/10/23 at 11 via phone.  {US HC Outreach:28874}  Have you seen any other providers since your last visit? **{YES NO:22349} Any changes in your medications or health? {YES NO:22349} Any side effects from any medications? {YES NO:22349} Do you have an symptoms or problems not managed by your medications? {YES NO:22349} Any concerns about your health right now? {YES NO:22349} Has your provider asked that you check blood pressure, blood sugar, or follow special diet at home? {YES NO:22349} Do you get any type of exercise on a regular basis? {YES NO:22349} Can you think of a goal you would like to reach for your health? *** Do you have any problems getting your medications? {YES NO:22349} Is there anything that you would like to discuss during the appointment? ***  Please bring medications and supplements to appointment   Chart review:  Recent office visits:  03/26/23 Ronnald Nian, MD - Patient presented for History of aspiration pneumonia and other concerns. Decreased Omeprazole.   03/20/23 Ronnald Nian, MD - Patient presented for G Tube feedings. No medication changes.   03/10/23 Ronnald Nian, MD - Patient presented for Acute aspiration pneumonia and other concerns. Prescribed Bactrim.   02/13/23 Ronnald Nian, MD  - Patient presented for history of oropharyngeal cancer and other concerns. No medication changes.   01/16/23 Ronnald Nian, MD - Patient presented for History of oropharyngeal cancer and other concerns. Prescribed Albuterol.  12/18/22 Ronnald Nian, MD - Patient presented for Acute aspiration pneumonia and other concerns.   11/27/22 Ronnald Nian, MD - Patient presented for Acute aspiration pneumonia and other concerns. No medication changes.    11/01/22 Barb Merino, LPN - Patient presented for Medicare Annual Wellness exam. No medication changes. Patient voiced goal of gaining strength.  10/23/22 Ronnald Nian, MD - Patient presented for History of aspiration pneumonia and other concerns. Prescribed Zofran.   Recent consult visits:  04/04/23 Little, Karma Lew, RN - [facility] Care Coordination visit for Essential hypertension and other concerns. No medication changes.   12/10/22 Vincente Poli, MD  (Pulmonology) - Patient presented for Shortness of breath and other concerns. Prescribed Chlorpheniramine.  11/20/22 Eino Farber, DO  (ENT) - Patient presented for Hoarseness and other concerns. Prescribed Midodrine.   11/20/22 Marcellino, Darliss Ridgel, MD  (Speech Therapy) - Patient presented for Vocal cord paralysis and other concerns. No medication changes.   11/06/22 Marcellino, Darliss Ridgel, MD  (Speech Therapy) - Patient presented for Vocal cord paralysis and other concerns. No medication changes.   Hospital visits:  Medication Reconciliation was completed by comparing discharge summary, patient's EMR and Pharmacy list, and upon discussion with patient.  Patient presented to [facility] on 02/05/23 due to  Aspiration Pneumonia. Patient was present for 8 days.  New?Medications Started at [facility] Discharge:?? -started  acetaminophen (TYLENOL) hydrALAZINE (APRESOLINE) metoCLOPramide (REGLAN) scopolamine (TRANSDERM-SCOP)  Medication Changes at Hospital Discharge: -Changed  none  Medications Discontinued at Hospital Discharge: -Stopped  FLORASTOR PO Medications that remain the same after Hospital Discharge:??  -All other medications will remain the same.     Patient presented to [facility] on 12/27/22 due to hypotension. Patient was present for 3 days.  New?Medications Started at [facility] Discharge:?? -started  ferrous sulfate sodium  chloride (OCEAN) sodium chloride  HYPERTONIC sulfamethoxazole-trimethoprim (BACTRIM  Medication Changes at Hospital Discharge: -Changed  guaiFENesin (ROBITUSSIN)  Medications Discontinued at Hospital Discharge: -Stopped   Medications that remain the same after Hospital Discharge:??  -All other medications will remain the same.    Patient presented to [facility] on 12/11/22 due to SOB. Patient was present for 2 days.  New?Medications Started at [facility] Discharge:?? -started  amoxicillin-clavulanate (AUGMENTIN)  Medication Changes at Hospital Discharge: -Changed  ondansetron (ZOFRAN)  Medications Discontinued at Hospital Discharge: -Stopped  midodrine 2.5 MG tablet (PROAMATINE) Medications that remain the same after Hospital Discharge:??  -All other medications will remain the same.     Patient presented to [facility] on 11/12/22 due to Acute aspiration Pneumonia. Patient was present for 2 days.  New?Medications Started at [facility] Discharge:?? -started  amoxicillin-clavulanate (AUGMENTIN)  Medication Changes at Hospital Discharge: -Changed  none  Medications Discontinued at Hospital Discharge: -Stopped  None  Medications that remain the same after Hospital Discharge:??  -All other medications will remain the same.        Fill History  : albuterol (PROVENTIL) (2.5 MG/3ML) 0.083% nebulizer solution 01/15/2023 5   AMLODIPINE BESYLATE 5MG  TAB 06/23/2021 90   amoxicillin-clavulanate (AUGMENTIN) 250-62.5 MG/5ML suspension 12/13/2022 10   FLUTICASONE PROP 50 MCG SPRAY 12/07/2022 90   HYDRALAZINE 25 MG TABLET 02/05/2023 30   METOCLOPRAMIDE 5 MG TABLET 02/05/2023 10   OMEPRAZOLE DR 40 MG CAPSULE 03/21/2023 90   ONDANSETRON 4 MG/5 ML SOLUTION 02/05/2023 3   sulfamethoxazole-trimethoprim (BACTRIM) 200-40 MG/5ML suspension 03/10/2023 5    Star Rating Drugs:  None   Care Gaps: Zoster Vaccine - Overdue PNA Vaccine - Overdue COVID Booster - Overdue    Pamala Duffel  CMA Clinical Pharmacist Assistant [phone]

## 2023-04-09 NOTE — Progress Notes (Signed)
Care Management & Coordination Services Pharmacy Note  04/10/2023 Name:  Lee Barnes MRN:  [id number] DOB:  28-Sep-1951  Summary: BP at goal <140/90 Concerned about not having oxygen available of patient O2 sat drops Not taking omeprazole due to cost of suspension, capsule beads clog tubes even with proper admin   Recommendations/Changes made from today's visit: -Counseled to check BP daily -ORDER portable oxygen, with PCP approval (patient is aware it can be a process to qualify and may not be approved) -Prior auth for omeprazole suspension pending  Follow up plan: General review in 1 month   Subjective: Lee Barnes is an 72 y.o. year old male who is a primary patient of Ronnald Nian, MD.  The care coordination team was consulted for assistance with disease management and care coordination needs.    Engaged with patient by telephone for initial visit. Conducted appt with wife at patient's request while he was present, as she handles and administers his medications. Has a G-J tube, main concern is even with proper admin instructions omeprazole capsules are clogging the tube and they do not want to pay out-of-pocket for the suspension. Prior auth pending. Also requesting an order for portable oxygen if possible so that he can safely travel when having breathing concerns.  Recent office visits: 03/26/23 Ronnald Nian, MD - Patient presented for History of aspiration pneumonia and other concerns. Decreased Omeprazole.    03/20/23 Ronnald Nian, MD - Patient presented for G Tube feedings. No medication changes.    03/10/23 Ronnald Nian, MD - Patient presented for Acute aspiration pneumonia and other concerns. Prescribed Bactrim.    02/13/23 Ronnald Nian, MD  - Patient presented for history of oropharyngeal cancer and other concerns. No medication changes.    01/16/23 Ronnald Nian, MD - Patient presented for History of oropharyngeal cancer and other concerns. Prescribed  Albuterol.   12/18/22 Ronnald Nian, MD - Patient presented for Acute aspiration pneumonia and other concerns.    11/27/22 Ronnald Nian, MD - Patient presented for Acute aspiration pneumonia and other concerns. No medication changes.    11/01/22 Barb Merino, LPN - Patient presented for Medicare Annual Wellness exam. No medication changes. Patient voiced goal of gaining strength.   10/23/22 Ronnald Nian, MD - Patient presented for History of aspiration pneumonia and other concerns. Prescribed Zofran.   Recent consult visits: 04/04/23 Little, Karma Lew, RN - [facility] Care Coordination visit for Essential hypertension and other concerns. No medication changes.    12/10/22 Vincente Poli, MD  (Pulmonology) - Patient presented for Shortness of breath and other concerns. Prescribed Chlorpheniramine.   11/20/22 Eino Farber, DO  (ENT) - Patient presented for Hoarseness and other concerns. Prescribed Midodrine.    11/20/22 Marcellino, Darliss Ridgel, MD  (Speech Therapy) - Patient presented for Vocal cord paralysis and other concerns. No medication changes.    11/06/22 Marcellino, Darliss Ridgel, MD  (Speech Therapy) - Patient presented for Vocal cord paralysis and other concerns. No medication changes.   Hospital visits: 02/05/23 [facility] - For Aspiration Pneumonia, LOS 8 days. Start tylenol, hydralazine, metoclopramide, scopolamine. Stop florastor  12/27/22 [facility] - For hypotension, LOS 3 days. START ferrous sulfate, bactrim. Changed robitussin  12/11/22 [facility] - For SOB, [place] 2 days, START Augmentin, changed zofran. Stop midodrine  11/12/22 [facility] - For acute aspiration pneumonia, LOS for 2 days, START Augmentin   Objective:  Lab Results  Component Value Date   CREATININE 1.02 03/26/2023   BUN 29 (H) 03/26/2023   EGFR 79 03/26/2023   GFRNONAA >60 02/05/2023   GFRAA 73 12/18/2020   NA 134 03/26/2023   K 5.6 (H) 03/26/2023    CALCIUM 9.8 03/26/2023   CO2 22 03/26/2023   GLUCOSE 91 03/26/2023    No results found for: "HGBA1C", "FRUCTOSAMINE", "GFR", "MICROALBUR"  Last diabetic Eye exam: No results found for: "HMDIABEYEEXA"  Last diabetic Foot exam: No results found for: "HMDIABFOOTEX"   Lab Results  Component Value Date   CHOL 162 03/31/2020   HDL 55 03/31/2020   LDLCALC 95 03/31/2020   TRIG 60 03/31/2020   CHOLHDL 2.9 03/31/2020       Latest Ref Rng & Units 03/26/2023   10:33 AM 02/19/2023   10:20 AM 02/13/2023    3:52 PM  Hepatic Function  Total Protein 6.0 - 8.5 g/dL 7.5  7.6  7.6   Albumin 3.8 - 4.8 g/dL 4.2  4.2  4.3   AST 0 - 40 IU/L 18  17  18    ALT 0 - 44 IU/L 19  17  22    Alk Phosphatase 44 - 121 IU/L 142  143  138   Total Bilirubin 0.0 - 1.2 mg/dL <1.6  0.2  <1.0     Lab Results  Component Value Date/Time   TSH 1.671 11/30/2021 01:58 PM   TSH 4.493 12/12/2020 04:45 PM   TSH 3.02 05/28/2016 02:49 PM   TSH 2.62 02/05/2016 12:01 AM   FREET4 1.09 03/24/2014 08:53 AM   FREET4 0.90 03/27/2011 08:50 AM       Latest Ref Rng & Units 03/26/2023   10:33 AM 02/13/2023    3:52 PM 02/05/2023    5:14 AM  CBC  WBC 3.4 - 10.8 x10E3/uL 5.2  5.8  5.6   Hemoglobin 13.0 - 17.7 g/dL 96.0  45.4  09.8   Hematocrit 37.5 - 51.0 % 41.0  38.0  35.4   Platelets 150 - 450 x10E3/uL 296  430  244     Lab Results  Component Value Date/Time   VD25OH 44.3 06/05/2022 11:56 AM   VITAMINB12 466 12/30/2022 07:48 AM    Clinical ASCVD: No  The ASCVD Risk score (Arnett DK, et al., 2019) failed to calculate for the following reasons:   Cannot find a previous HDL lab   Cannot find a previous total cholesterol lab       11/01/2022   11:33 AM 04/10/2022    9:27 AM 04/02/2021    8:32 AM  Depression screen PHQ 2/9  Decreased Interest 0 0 0  Down, Depressed, Hopeless 0 0 0  PHQ - 2 Score 0 0 0  Altered sleeping 0    Tired, decreased energy 0    Change in appetite 0    Feeling bad or failure about yourself  0     Trouble concentrating 0    Moving slowly or fidgety/restless 0    Suicidal thoughts 0    PHQ-9 Score 0    Difficult doing work/chores Not difficult at all       Social History   Tobacco Use  Smoking Status Never  Smokeless Tobacco Never   BP Readings from Last 3 Encounters:  03/26/23 126/88  03/13/23 112/66  03/04/23 136/84   Pulse Readings from Last 3 Encounters:  03/26/23 85  03/13/23 88  03/04/23 88   Wt Readings from Last 3 Encounters:  03/26/23 167 lb 3.2 oz (  75.8 kg)  03/10/23 167 lb (75.8 kg)  03/04/23 167 lb 12.8 oz (76.1 kg)   BMI Readings from Last 3 Encounters:  03/26/23 22.68 kg/m  03/10/23 22.65 kg/m  03/04/23 22.76 kg/m    Allergies  Allergen Reactions   Codeine Nausea And Vomiting    Medications Reviewed Today     Reviewed by Riley Churches, RN (Registered Nurse) on 04/04/23 at 1125  Med List Status: <None>   Medication Order Taking? Sig Documenting Provider Last Dose Status Informant  acetaminophen (TYLENOL) 325 MG tablet [id number] No Place 2 tablets (650 mg total) into feeding tube every 6 (six) hours as needed for mild pain (or Fever >/= 101). Alwyn Ren, MD Taking Active   albuterol (PROVENTIL) (2.5 MG/3ML) 0.083% nebulizer solution [id number] No Inhale 3 mLs (2.5 mg total) by nebulization every 4 (four) hours as needed for wheezing. Ronnald Nian, MD Taking Active Self, Pharmacy Records  cetirizine (ZYRTEC) 10 MG chewable tablet [id number] No Place 10 mg into feeding tube daily as needed for allergies or rhinitis. [provider] Taking Active Self, Pharmacy Records  diphenhydrAMINE (BENADRYL) 25 MG tablet [id number] No Take 12.5 mg by mouth at bedtime as needed for allergies (to dry secretions). [provider] Taking Active Self, Pharmacy Records           Med Note Sharman Crate Jan 16, 2023 11:09 AM) 1/2 tab every night  ferrous sulfate 300 (60 Fe) MG/5ML syrup [id number] No Take 5 mLs (300 mg total)  by mouth daily. Alba Cory, MD Past Week Expired 02/01/23 2359 Self, Pharmacy Records  fluticasone (FLONASE) 50 MCG/ACT nasal spray [id number] No SPRAY TWO SPRAYS IN EACH NOSTRIL ONCE DAILY AS NEEDED  Patient taking differently: Place 2 sprays into both nostrils at bedtime.   Ronnald Nian, MD Taking Active Self, Pharmacy Records  guaiFENesin [facility]) 100 MG/5ML liquid [id number] No Place 30 mLs into feeding tube 2 (two) times daily. Alba Cory, MD Taking Active Self, Pharmacy Records  hydrALAZINE (APRESOLINE) 25 MG tablet [id number] No Place 1 tablet (25 mg total) into feeding tube every 8 (eight) hours as needed (for SBP greater than 175). Alwyn Ren, MD Taking Active   metoCLOPramide (REGLAN) 5 MG tablet [id number] No Place 1 tablet (5 mg total) into feeding tube every 8 (eight) hours as needed for nausea. Alwyn Ren, MD Taking Active            Med Note Clarene Duke, Orpah Greek Feb 10, 2023 10:14 AM) Wife reports patient is having increased urinary frequency with use of Reglan, the medication is on hold. Wife/patient will discuss with Dr. Elnoria Howard.   Nutritional Supplements (FEEDING SUPPLEMENT, OSMOLITE 1.5 CAL,) LIQD [id number] No Place 1,425 mLs into feeding tube daily. Narda Bonds, MD Taking Active Self, Pharmacy Records           Med Note Sharman Crate Jan 16, 2023 11:11 AM) And protein supplement  omeprazole (KONVOMEP) 2 mg/mL SUSP oral suspension [id number]  Take 5 mLs (10 mg total) by mouth daily. Ronnald Nian, MD  Active   scopolamine (TRANSDERM-SCOP) 1 MG/3DAYS [id number] No Place 1 patch (1.5 mg total) onto the skin every 3 (three) days. Alwyn Ren, MD Taking Active   sodium chloride (OCEAN) 0.65 % SOLN nasal spray [id number] No Place 1 spray into both nostrils as needed for congestion. Alba Cory, MD Taking Active Self, Pharmacy Records  sodium  chloride HYPERTONIC 3 % nebulizer solution [id number] No Take 4 mLs by  nebulization daily. Alba Cory, MD Taking Active Self, Pharmacy Records  sulfamethoxazole-trimethoprim (BACTRIM) 200-40 MG/5ML suspension [id number] No Take 20 mLs by mouth 2 times daily. Ronnald Nian, MD Taking Active   Med List Note Cliffton Asters, Elvin So, CPhT 06/26/22 7829): Extra fluids at 1000 - Gatorade or Pedialyte            SDOH:  (Social Determinants of Health) assessments and interventions performed: Yes SDOH Interventions    Flowsheet Row Care Coordination from 04/10/2023 in [facility] Telephone from 02/06/2023 in [facility] Care Coordination ED to Hosp-Admission (Discharged) from 12/27/2022 in [facility] Telephone from 12/17/2022 in [facility] Care Coordination Clinical Support from 11/01/2022 in [facility] Telephone from 10/16/2022 in [facility] Coordination  SDOH Interventions        Food Insecurity Interventions Intervention Not Indicated Intervention Not Indicated Inpatient TOC Intervention Not Indicated Intervention Not Indicated Intervention Not Indicated  Housing Interventions Intervention Not Indicated -- Inpatient TOC -- -- --  Transportation Interventions -- Intervention Not Indicated Inpatient TOC Intervention Not Indicated Intervention Not Indicated Intervention Not Indicated  Utilities Interventions -- -- Inpatient TOC -- -- --  Depression Interventions/Treatment  -- -- -- -- PHQ2-9 Score <4 Follow-up Not Indicated --  Financial Strain Interventions -- -- -- -- Intervention Not Indicated --  Physical Activity Interventions -- -- -- -- Patient Refused, Other (Comments) --  Stress Interventions -- -- -- -- Intervention Not Indicated --       Medication Assistance:  Omeprazole suspension not covered by insurance. Prior auth pending  Medication Access: Name and location of current pharmacy:  [facility] [id number] - [place], [place] -  [address] [place] [address] Phone: [phone] Fax: [phone]  [place] [facility] [address] [place] [address] Phone: [phone] Fax: [phone]  Within the past 30 days, how often has patient missed a dose of medication? None Is a pillbox or other method used to improve adherence? Yes  Factors that may affect medication adherence? financial need Are meds synced by current pharmacy? No  Are meds delivered by current pharmacy? No  Does patient experience delays in picking up medications due to transportation concerns? No   Compliance/Adherence/Medication fill history: Care Gaps: Zoster Vaccine - Overdue PNA Vaccine - Overdue COVID Booster - Overdue  Star-Rating Drugs: None   Assessment/Plan Hypertension (BP goal <140/90) -Controlled -Current treatment: Hydralazine 25mg  1 tab into feeding tube every 8 hours as needed (for SBP >175) -Medications previously tried: Amlodipine, Lisinopril, Losartan, HCTZ -Current home readings: 98/67 today, denies any dizziness or fatigue -Current dietary habits: feeds via feeding tube -Current exercise habits: not discussed -Denies hypotensive/hypertensive symptoms -Educated on BP goals and benefits of medications for prevention of heart attack, stroke and kidney damage; Importance of home blood pressure monitoring; Proper BP monitoring technique; -Counseled to monitor BP at home daily, document, and provide log at future appointments -Recommended to continue current medication  GERD/Diverticulosis  (Goal: Reduce abdominal discomfort and lmit diverticulosis flares) -Not ideally controlled -Current treatment  Omeprazole 2mg /ml oral suspension 5ml daily Appropriate, Effective, Safe, Query Accessible -Medications previously tried: None  -Having issues with capsule beads clogging the tube so have not been giving. Reviewed proper admin to avoid clogging and patient had been doing  so correctly, still having clog issues. Prefer  suspension but not covered by insurance, prior auth pending. Denies any GERD concerns at this time.  Allergic Rhinitis/SOB (Goal: Achieve respiratory function that still allows for ADLs -Controlled -Current treatment  Albuterol 0.083% neb solution 1 vial q 4 h prn wheezing Appropriate, Effective, Safe, Accessible Cetirizine 10mg  chewable tablet 1 tab prn daily Appropriate, Effective, Safe, Accessible Benadryl 25mg  1/2 tab daily at bedtime prn secretions Appropriate, Effective, Safe, Accessible Flonase nasal spray 2 sprays into each nostril prn Appropriate, Effective, Safe, Accessible Guaifenesin 100mg /3ml liquid 30 mL into feeding tube BID Appropriate, Effective, Safe, Accessible Ocean 0.65% nasal spray prn congestion Appropriate, Effective, Safe, Accessible -Medications previously tried: None  -Recommended to continue current medication Patient requests order for portable oxygen so that they may travel, will request from PCP  OTC -Current treatment  Feeding solution into feeding tube daily Appropriate, Effective, Safe, Accessible Ferrous sulfate 325mg  1 tab crushed via tube qd Appropriate, Effective, Safe, Accessible Acetaminophen 325mg  prn Appropriate, Effective, Safe, Accessible   Sherrill Raring Clinical Pharmacist [phone]

## 2023-04-10 ENCOUNTER — Ambulatory Visit: Payer: Medicare Other

## 2023-04-10 ENCOUNTER — Other Ambulatory Visit: Payer: Medicare Other

## 2023-04-10 VITALS — BP 134/88 | HR 85 | Temp 97.3°F

## 2023-04-10 DIAGNOSIS — Z515 Encounter for palliative care: Secondary | ICD-10-CM

## 2023-04-10 NOTE — Progress Notes (Signed)
BIN : [id number] PCN: [id number] ID: [id number] Group: [id number]    Pamala Duffel CMA Clinical Pharmacist Assistant [phone]

## 2023-04-11 ENCOUNTER — Telehealth: Payer: Self-pay | Admitting: Internal Medicine

## 2023-04-11 ENCOUNTER — Telehealth: Payer: Self-pay

## 2023-04-11 ENCOUNTER — Other Ambulatory Visit (HOSPITAL_COMMUNITY): Payer: Self-pay

## 2023-04-11 DIAGNOSIS — G903 Multi-system degeneration of the autonomic nervous system: Secondary | ICD-10-CM | POA: Diagnosis not present

## 2023-04-11 DIAGNOSIS — J9601 Acute respiratory failure with hypoxia: Secondary | ICD-10-CM | POA: Diagnosis not present

## 2023-04-11 DIAGNOSIS — Z85819 Personal history of malignant neoplasm of unspecified site of lip, oral cavity, and pharynx: Secondary | ICD-10-CM | POA: Diagnosis not present

## 2023-04-11 DIAGNOSIS — Z931 Gastrostomy status: Secondary | ICD-10-CM | POA: Diagnosis not present

## 2023-04-11 DIAGNOSIS — J690 Pneumonitis due to inhalation of food and vomit: Secondary | ICD-10-CM | POA: Diagnosis not present

## 2023-04-11 DIAGNOSIS — E43 Unspecified severe protein-calorie malnutrition: Secondary | ICD-10-CM | POA: Diagnosis not present

## 2023-04-11 NOTE — Telephone Encounter (Signed)
Pt's wife was notified.  I have faxed over order to [facility] [phone]

## 2023-04-11 NOTE — Telephone Encounter (Signed)
Spoke with Wife and he gets Aspiration Pneumonia easy. He was in the hospital Dec- March at different times for Aspiration Pneumonia. They have an oxygen tank at home that they use as needed when he has a flare in coughing fits and can't get his oxygen back up. They are having to pay out of pocket right now for the oxygen tank that they use. They are going out of town 2nd of June and would like a portable oxygen tank to take with them. She said that Dr. Susann Givens told them to use 2 liters if he needs oxygen. Having an portable oxygen tank will help keep him out to the ER when he needs oxygen. Please advise

## 2023-04-11 NOTE — Telephone Encounter (Signed)
-----   Message from Sherrill Raring, [facility] sent at 04/10/2023  1:47 PM EDT ----- Regarding: Portable Oxygen Pt would like to have access to a portable oxygen tank in case his oxygen levels drop while they travel.  Could an order to [facility] be placed?   Thank you, Sherrill Raring Clinical Pharmacist [phone]

## 2023-04-11 NOTE — Telephone Encounter (Signed)
Left message for pts wife to give us a call back

## 2023-04-11 NOTE — Telephone Encounter (Signed)
PA submitted for FIRST-Omeprazole suspension.  PA denied and request for expedited appeal has been submitted. Decision expected by 04/18/23.   Reference/Ticket number: [id number]. Appeal Line: [phone]  Sherrill Raring Clinical Pharmacist [phone]

## 2023-04-11 NOTE — Telephone Encounter (Signed)
Is this okay?

## 2023-04-15 ENCOUNTER — Encounter: Payer: Self-pay | Admitting: Family Medicine

## 2023-04-15 ENCOUNTER — Telehealth: Payer: Self-pay | Admitting: Family Medicine

## 2023-04-15 NOTE — Telephone Encounter (Signed)
Recv'd denial for Omeprazole suspension 2mg , typed appeal letter & faxed to [facility]

## 2023-04-16 DIAGNOSIS — R633 Feeding difficulties, unspecified: Secondary | ICD-10-CM | POA: Diagnosis not present

## 2023-04-16 NOTE — Progress Notes (Signed)
PATIENT NAME: Lee Barnes DOB: 1951/05/14 MRN: [id number]  PRIMARY CARE PROVIDER: Ronnald Nian, MD  RESPONSIBLE PARTY:  Acct ID - Guarantor Home Phone Work Phone Relationship Acct Type  [id number] - Gracie,[name] [phone]  Self P/F     [address], [place], [place] [address]   Palliative Care Follow Up Encounter Note    Completed home visit. [name] also present.     Today's Visit:   Respiratory: no SOB   Cognitive: alert and oriented; at times pt was difficult to understand    Appetite: uses a pump for tube feed; pt's intake is now 1185 ml of formula daily and 750 ml of water daily   GI/GU: sometimes has loose BMs and manages it with Imodium;  no difficulties with bowel or bladder   Mobility: independent without assistive devices   ADLs: still independent with bathing, dressing and grooming but needs assist from wife with all other ADLs    Sleeping Pattern: still sleeps through the night but get up frequently to go to the bathroom; he is able to go back to sleep   Pain: denies pain at this time   Fall: no falls since last visit  Skin: pt's skin is red under the bottom of the J-tube external bolster; encouraged pt to call PCP and in the meantime put a tight weave gauze under the area   Goals of Care: To stay in the home with his wife         CODE STATUS: Full Code ADVANCED DIRECTIVES: N MOST FORM: N PPS: 70%    PHYSICAL EXAM:   VITALS: Today's Vitals   04/10/23 1220  BP: 134/88  Pulse: 85  Temp: (!) 97.3 F (36.3 C)  TempSrc: Temporal  SpO2: 93%  PainSc: 0-No pain    LUNGS: clear to auscultation  CARDIAC: Cor RRR EXTREMITIES: AROM x4 SKIN: warm, dry, turgor normal NEURO: negative   Next scheduled visit: 05/28/23 @ 12pm   Jannine Abreu Clementeen Graham, LPN

## 2023-04-18 ENCOUNTER — Other Ambulatory Visit (HOSPITAL_COMMUNITY): Payer: Self-pay

## 2023-04-28 ENCOUNTER — Ambulatory Visit: Payer: Self-pay | Admitting: Licensed Clinical Social Worker

## 2023-04-30 NOTE — Patient Instructions (Signed)
Visit Information  Thank you for taking time to visit with me today. Please don't hesitate to contact me if I can be of assistance to you.   Following are the goals we discussed today:   Goals Addressed             This Visit's Progress    Obtain Supportive Resources-Caregiver Fatigue   On track    Activities and task to complete in order to accomplish goals.   Keep all upcoming appointments discussed today Implement healthy coping skills discussed to assist with management of symptoms Continue working with [facility] care team to assist with goals identified F/up with medication appeal           Our next appointment is by telephone on 7/22 at 10 AM  Please call the care guide team at [phone] if you need to cancel or reschedule your appointment.   If you are experiencing a Mental Health or Behavioral Health Crisis or need someone to talk to, please call the Suicide and Crisis Lifeline: 988 call 911   Patient verbalizes understanding of instructions and care plan provided today and agrees to view in MyChart. Active MyChart status and patient understanding of how to access instructions and care plan via MyChart confirmed with patient.     Jenel Lucks, MSW, LCSW [facility] Care Management   [facility] [email or web address] Phone [phone] 8:27 AM

## 2023-04-30 NOTE — Patient Outreach (Signed)
  Care Coordination   Follow Up Visit Note   04/28/2023 Name: MATSON WELCH MRN: [id number] DOB: 14-May-1951  LABRIAN TORREGROSSA is a 72 y.o. year old male who sees Ronnald Nian, MD for primary care. I spoke with  Prentice Docker [place]ne's spouse by phone today.  What matters to the patients health and wellness today?  Symptom Management and DME    Goals Addressed             This Visit's Progress    Obtain Supportive Resources-Caregiver Fatigue   On track    Activities and task to complete in order to accomplish goals.   Keep all upcoming appointments discussed today Implement healthy coping skills discussed to assist with management of symptoms Continue working with [facility] care team to assist with goals identified F/up with medication appeal           SDOH assessments and interventions completed:  No     Care Coordination Interventions:  Yes, provided  Interventions Today    Flowsheet Row Most Recent Value  Chronic Disease   Chronic disease during today's visit Hypertension (HTN)  General Interventions   General Interventions Discussed/Reviewed General Interventions Reviewed, Durable Medical Equipment (DME), Doctor Visits  Doctor Visits Discussed/Reviewed Doctor Visits Reviewed, Specialist  Durable Medical Equipment (DME) Oxygen  [Family will continue to pay out of pocket for oxygen due to pt not qualifying, per insurance. Spouse willl buy medical supplies via [employer], if needed noting DME supplier has supplies (bags/split gauze) on back order]  Mental Health Interventions   Mental Health Discussed/Reviewed Mental Health Reviewed, Coping Strategies  Nutrition Interventions   Nutrition Discussed/Reviewed Nutrition Reviewed  Pharmacy Interventions   Pharmacy Dicussed/Reviewed Pharmacy Topics Reviewed, Medication Adherence       Follow up plan: Follow up call scheduled for 4-6 weeks    Encounter Outcome:  Pt. Visit Completed   Jenel Lucks, MSW, LCSW [facility] Care  Management [facility]  [facility] [email or web address] Phone [phone] 8:26 AM

## 2023-05-05 ENCOUNTER — Ambulatory Visit: Payer: Self-pay

## 2023-05-05 NOTE — Patient Outreach (Signed)
  Care Coordination   Follow Up Visit Note   05/05/2023 Name: Lee Barnes MRN: [id number] DOB: Oct 28, 1951  Lee Barnes is a 72 y.o. year old male who sees Lee Nian, MD for primary care. I spoke with [name] by phone today.  What matters to the patients health and wellness today?  Patient's wife needs assistance with requesting replacement tubing for patient's nebulizer and suction canister. She is hopeful patient will continue to get stronger and become more independent.     Goals Addressed             This Visit's Progress    Avoid Nutritional Imbalance       Care Coordination Interventions: Completed successful inbound call with [name] Evaluation of current treatment plan related to nutritional imbalance  and patient's adherence to plan as established by provider Determined patient is gaining weight since getting G tube replaced with G-J tube and adjusting rate and amount of tube feedings  Reviewed and discussed with wife, patient's recent GI follow up with Dr. Elnoria Howard;  The continous tube feeding during the daytime if fine with a rest overnight. This is more with the natural circadian rhythm of the body. Bolus feeding can be retried, but any feeding pattern will be acceptable as long as his weight remains in the 160's range Determined patient is maintaining his weight in the 160's, he is having minimal diarrhea and few episodes of vomiting      No complications from aspirational pnemonia       Care Coordination Interventions: Completed successful call with [name] Evaluation of current treatment plan related to aspirational pneumonia  and patient's adherence to plan as established by provider Determined patient is in need of tube replacement for his nebulizer and suction  Discussed with [name], [employer] is the supplier for this DME  Placed outbound call to [employer], spoke with [place] CSR who confirmed she will place the order for  replacement tubing for patient's nebulizer and suction canister, she will request 2 sets of each, the expected delivery time is 3-5 days but usually only takes 1-2 days     Interventions Today    Flowsheet Row Most Recent Value  Chronic Disease   Chronic disease during today's visit Other  [GJ Tube issues]  General Interventions   General Interventions Discussed/Reviewed General Interventions Discussed, General Interventions Reviewed, Doctor Visits, Durable Medical Equipment (DME), Communication with  [[employer] for DME needs]  Doctor Visits Discussed/Reviewed Doctor Visits Discussed, Doctor Visits Reviewed, Specialist  Durable Medical Equipment (DME) Other  [nebulizer tubing, suction]  Education Interventions   Education Provided Provided Education  Provided Verbal Education On Nutrition, When to see the doctor  Nutrition Interventions   Nutrition Discussed/Reviewed Nutrition Discussed, Nutrition Reviewed  [J-tube feedings]          SDOH assessments and interventions completed:  No     Care Coordination Interventions:  Yes, provided   Follow up plan: Follow up call scheduled for 05/19/23 @09 :00 AM    Encounter Outcome:  Pt. Visit Completed

## 2023-05-05 NOTE — Patient Instructions (Signed)
Visit Information  Thank you for taking time to visit with me today. Please don't hesitate to contact me if I can be of assistance to you.   Following are the goals we discussed today:   Goals Addressed             This Visit's Progress    Avoid Nutritional Imbalance       Care Coordination Interventions: Completed successful inbound call with wife [name] Evaluation of current treatment plan related to nutritional imbalance  and patient's adherence to plan as established by provider Determined patient is gaining weight since getting G tube replaced with G-J tube and adjusting rate and amount of tube feedings  Reviewed and discussed with wife, patient's recent GI follow up with Dr. Elnoria Howard;  The continous tube feeding during the daytime if fine with a rest overnight. This is more with the natural circadian rhythm of the body. Bolus feeding can be retried, but any feeding pattern will be acceptable as long as his weight remains in the 160's range Determined patient is maintaining his weight in the 160's, he is having minimal diarrhea and few episodes of vomiting      No complications from aspirational pnemonia       Care Coordination Interventions: Completed successful call with wife [name] Evaluation of current treatment plan related to aspirational pneumonia  and patient's adherence to plan as established by provider Determined patient is in need of tube replacement for his nebulizer and suction  Discussed with wife Luster Landsberg, [employer] is the supplier for this DME  Placed outbound call to [employer], spoke with [place] CSR who confirmed she will place the order for replacement tubing for patient's nebulizer and suction canister, she will request 2 sets of each, the expected delivery time is 3-5 days but usually only takes 1-2 days         Our next appointment is by telephone on 05/19/23 at 09:00 AM  Please call the care guide team at [phone] if you need to cancel or reschedule  your appointment.   If you are experiencing a Mental Health or Behavioral Health Crisis or need someone to talk to, please call 1-800-273-TALK (toll free, 24 hour hotline)  Patient verbalizes understanding of instructions and care plan provided today and agrees to view in MyChart. Active MyChart status and patient understanding of how to access instructions and care plan via MyChart confirmed with patient.     Delsa Sale, RN, BSN, CCM Care Management Coordinator [facility] Care Management Direct Phone: [phone]

## 2023-05-13 DIAGNOSIS — E871 Hypo-osmolality and hyponatremia: Secondary | ICD-10-CM | POA: Diagnosis not present

## 2023-05-13 DIAGNOSIS — N182 Chronic kidney disease, stage 2 (mild): Secondary | ICD-10-CM | POA: Diagnosis not present

## 2023-05-13 DIAGNOSIS — R0989 Other specified symptoms and signs involving the circulatory and respiratory systems: Secondary | ICD-10-CM | POA: Diagnosis not present

## 2023-05-13 DIAGNOSIS — E875 Hyperkalemia: Secondary | ICD-10-CM | POA: Diagnosis not present

## 2023-05-19 ENCOUNTER — Ambulatory Visit: Payer: Self-pay

## 2023-05-19 NOTE — Patient Outreach (Signed)
  Care Coordination   05/19/2023 Name: Lee Barnes MRN: [id number] DOB: 27-Jan-1951   Care Coordination Outreach Attempts:  An unsuccessful telephone outreach was attempted for a scheduled appointment today.  Follow Up Plan:  Additional outreach attempts will be made to offer the patient care coordination information and services.   Encounter Outcome:  No Answer   Care Coordination Interventions:  No, not indicated    Delsa Sale, RN, BSN, CCM Care Management Coordinator [facility] Care Management  Direct Phone: [phone]

## 2023-05-19 NOTE — Patient Instructions (Signed)
Visit Information  Thank you for taking time to visit with me today. Please don't hesitate to contact me if I can be of assistance to you.   Following are the goals we discussed today:   Goals Addressed             This Visit's Progress    No complications from aspirational pnemonia       Care Coordination Interventions: Completed successful call with wife [name] Evaluation of current treatment plan related to aspirational pneumonia  and patient's adherence to plan as established by provider Discussed with wife [name] that patient received some replacement tubing for his suction, however he did not receive a Yankauer suction and or a replacement canister Discussed [name] will contact [facility] again to request these supplies Instructed [name] to contact this RN if further assistance is needed Discussed patient continues to have daytime continuous feedings and this has been restrictive as far as daily activities and being able to leave his home, however he has remained free from symptoms of aspiration and or hospital events         Our next appointment is by telephone on 06/16/23 at 09:00 AM  Please call the care guide team at [phone] if you need to cancel or reschedule your appointment.   If you are experiencing a Mental Health or Behavioral Health Crisis or need someone to talk to, please call 1-800-273-TALK (toll free, 24 hour hotline)  Patient verbalizes understanding of instructions and care plan provided today and agrees to view in MyChart. Active MyChart status and patient understanding of how to access instructions and care plan via MyChart confirmed with patient.     Delsa Sale, RN, BSN, CCM Care Management Coordinator [facility] Care Management  Direct Phone: [phone]

## 2023-05-19 NOTE — Patient Outreach (Signed)
  Care Coordination   Follow Up Visit Note   05/19/2023 Name: Lee Barnes MRN: [id number] DOB: May 29, 1951  Lee Barnes is a 72 y.o. year old male who sees Ronnald Nian, MD for primary care. I spoke with wife Romas Warlick by phone today.  What matters to the patients health and wellness today?  Wife/patient are still in need of a replacement yankauer suction and suction canister.     Goals Addressed             This Visit's Progress    No complications from aspirational pnemonia       Care Coordination Interventions: Completed successful call with wife [name] Evaluation of current treatment plan related to aspirational pneumonia  and patient's adherence to plan as established by provider Discussed with wife [name] that patient received some replacement tubing for his suction, however he did not receive a Yankauer suction and or a replacement canister Discussed [name] will contact [facility] again to request these supplies Instructed [name] to contact this RN if further assistance is needed Discussed patient continues to have daytime continuous feedings and this has been restrictive as far as daily activities and being able to leave his home, however he has remained free from symptoms of aspiration and or hospital events     Interventions Today    Flowsheet Row Most Recent Value  Chronic Disease   Chronic disease during today's visit Other  [G J tube supplies]  General Interventions   General Interventions Discussed/Reviewed General Interventions Discussed, General Interventions Reviewed, Durable Medical Equipment (DME)  Durable Medical Equipment (DME) Other  [G J tube supplies]  Education Interventions   Education Provided Provided Education  Provided Verbal Education On When to see the doctor          SDOH assessments and interventions completed:  No     Care Coordination Interventions:  Yes, provided   Follow up plan: Follow up call scheduled for  06/16/23 @09 :00 AM    Encounter Outcome:  Pt. Visit Completed

## 2023-05-21 NOTE — Telephone Encounter (Signed)
Appeal for Omeprazole suspension 2mg  was denied, states this is one of those excluded drugs because it is not listed on the NSDE file. No other reason for denial.   Dr. Susann Givens would you want to call & do a Peer to Peer review, to see if you can get this approved ? if so you call #[phone] pt's [facility] ID# [id number]

## 2023-05-28 ENCOUNTER — Other Ambulatory Visit: Payer: Medicare Other

## 2023-05-29 ENCOUNTER — Encounter: Payer: Self-pay | Admitting: Family Medicine

## 2023-05-29 DIAGNOSIS — Z85819 Personal history of malignant neoplasm of unspecified site of lip, oral cavity, and pharynx: Secondary | ICD-10-CM

## 2023-05-29 DIAGNOSIS — Z8701 Personal history of pneumonia (recurrent): Secondary | ICD-10-CM

## 2023-05-29 NOTE — Telephone Encounter (Signed)
I called pharmacy and spoke with the pharmacist. Per pharmacy, there is one refill remaining on the Albuterol nebulizer solution. This prescription was written for a 25 days supply and was last filled on 05/14/2023, so pharmacy says that patient can fill this prescription in 3 days.   I called patient's wife and advised her of this. Patient's wife says that patient has been using the medication 2 times a day everyday. She says no one told her the medication was supposed to be used as needed. Please clarify if patient is supposed to use this medication every day or as needed.

## 2023-05-30 ENCOUNTER — Other Ambulatory Visit: Payer: Self-pay | Admitting: Family Medicine

## 2023-05-30 DIAGNOSIS — Z8701 Personal history of pneumonia (recurrent): Secondary | ICD-10-CM

## 2023-05-30 DIAGNOSIS — Z85819 Personal history of malignant neoplasm of unspecified site of lip, oral cavity, and pharynx: Secondary | ICD-10-CM

## 2023-05-30 MED ORDER — ALBUTEROL SULFATE (2.5 MG/3ML) 0.083% IN NEBU: 2.5000 mg | INHALATION_SOLUTION | RESPIRATORY_TRACT | 5 refills | Status: DC | PRN

## 2023-05-30 MED ORDER — ALBUTEROL SULFATE (2.5 MG/3ML) 0.083% IN NEBU
2.5000 mg | INHALATION_SOLUTION | RESPIRATORY_TRACT | 5 refills | Status: DC | PRN
Start: 2023-05-30 — End: 2023-09-09

## 2023-05-30 MED ORDER — ALBUTEROL SULFATE (2.5 MG/3ML) 0.083% IN NEBU
2.5000 mg | INHALATION_SOLUTION | RESPIRATORY_TRACT | 5 refills | Status: DC | PRN
Start: 2023-05-30 — End: 2023-07-23

## 2023-05-30 NOTE — Addendum Note (Signed)
Addended by: Herminio Commons A on: 05/30/2023 08:05 AM   Modules accepted: Orders

## 2023-06-02 ENCOUNTER — Encounter: Payer: Self-pay | Admitting: Family Medicine

## 2023-06-04 NOTE — Telephone Encounter (Signed)
I put a copy of the denial & my letter in your folder

## 2023-06-09 ENCOUNTER — Ambulatory Visit: Payer: Self-pay | Admitting: Licensed Clinical Social Worker

## 2023-06-10 NOTE — Patient Outreach (Signed)
  Care Coordination   Follow Up Visit Note   06/09/2023 Name: NATALIO SALOIS MRN: [id number] DOB: [birth year]  DEMIR TITSWORTH is a 72 y.o. year old male who sees Ronnald Nian, MD for primary care. I spoke with  Prentice Docker Lilley's spouse by phone today.  What matters to the patients health and wellness today?  Stress management    Goals Addressed             This Visit's Progress    Obtain Supportive Resources-Caregiver Fatigue   On track    Activities and task to complete in order to accomplish goals.   Keep all upcoming appointments discussed today Implement healthy coping skills discussed to assist with management of symptoms Continue working with [facility] care team to assist with goals identified          SDOH assessments and interventions completed:  No     Care Coordination Interventions:  Yes, provided  Interventions Today    Flowsheet Row Most Recent Value  Chronic Disease   Chronic disease during today's visit Hypertension (HTN)  General Interventions   General Interventions Discussed/Reviewed General Interventions Reviewed, Doctor Visits, Durable Medical Equipment (DME)  [Pt received DME supplies on 7/20. Family discussed various barriers to which lcsw  validated and provided encouragement]  Doctor Visits Discussed/Reviewed Doctor Visits Reviewed  Mental Health Interventions   Mental Health Discussed/Reviewed Mental Health Reviewed, Coping Strategies  [Family is practicing self-care. Has upcoming trip to beach with family and friends to promote mood and well-being]  Nutrition Interventions   Nutrition Discussed/Reviewed Nutrition Reviewed  Pharmacy Interventions   Pharmacy Dicussed/Reviewed Pharmacy Topics Reviewed, Medication Adherence       Follow up plan: Follow up call scheduled for 6-8 weeks    Encounter Outcome:  Pt. Visit Completed   Jenel Lucks, MSW, LCSW [facility] Care Management [facility]  [facility] [email or web address] Phone [phone] 4:10 PM

## 2023-06-10 NOTE — Patient Instructions (Signed)
Visit Information  Thank you for taking time to visit with me today. Please don't hesitate to contact me if I can be of assistance to you.   Following are the goals we discussed today:   Goals Addressed             This Visit's Progress    Obtain Supportive Resources-Caregiver Fatigue   On track    Activities and task to complete in order to accomplish goals.   Keep all upcoming appointments discussed today Implement healthy coping skills discussed to assist with management of symptoms Continue working with [facility] care team to assist with goals identified          Our next appointment is by telephone on 09/03 at 10 AM  Please call the care guide team at [phone] if you need to cancel or reschedule your appointment.   If you are experiencing a Mental Health or Behavioral Health Crisis or need someone to talk to, please call the Suicide and Crisis Lifeline: 988 call 911   Patient verbalizes understanding of instructions and care plan provided today and agrees to view in MyChart. Active MyChart status and patient understanding of how to access instructions and care plan via MyChart confirmed with patient.     Jenel Lucks, MSW, LCSW [facility] Care Management [place]  [facility] [email or web address] Phone [phone] 4:11 PM

## 2023-06-16 ENCOUNTER — Ambulatory Visit: Payer: Self-pay

## 2023-06-16 NOTE — Patient Instructions (Addendum)
Visit Information  Thank you for taking time to visit with me today. Please don't hesitate to contact me if I can be of assistance to you.   Following are the goals we discussed today:   Goals Addressed             This Visit's Progress    Avoid Nutritional Imbalance   On track    Care Coordination Interventions: Completed successful inbound call with wife [name] Evaluation of current treatment plan related to nutritional imbalance  and patient's adherence to plan as established by provider Discussed and reviewed with [name], patient is maintaining his weight in the 160's, he is having minimal diarrhea and few episodes of vomiting  Discussed she may start introducing small boluses of tube feedings Determined [name] is aware of signs/symptoms of aspiration and when to call the doctor if symptoms occur, wife states his vomiting has subsided      No complications from aspirational pnemonia   On track    Care Coordination Interventions: Completed successful call with [name] Evaluation of current treatment plan related to aspirational pneumonia  and patient's adherence to plan as established by provider Discussed with [name] that patient received some replacement tubing for his suction, however she received the incorrect Yankauer suction  Discussed Lee Barnes contacted [facility] to request the correct non-vented [place] suctions but has yet to receive them Instructed Lee Barnes to contact this RN if further assistance is needed Per [name], patient's vomiting has subsided with no further evidence of aspiration  Instructed Lee Barnes to contact patient's doctor to report new symptoms or concerns                     *To monitor BP at home  Care Coordination Interventions: Evaluation of current treatment plan related to hypertension self management and patient's adherence to plan as established by provider Advised patient, providing education and rationale, to monitor blood pressure  daily and record, calling PCP for findings outside established parameters Advised patient to discuss BP log  with provider Discussed complications of poorly controlled blood pressure such as heart disease, stroke, circulatory complications, vision complications, kidney impairment, sexual dysfunction Mailed printed educational materials related to Hypertension Last practice recorded BP readings:  BP Readings from Last 3 Encounters:  04/10/23 134/88  03/26/23 126/88  03/13/23 112/66   Most recent eGFR/CrCl:  Lab Results  Component Value Date   EGFR 79 03/26/2023    No components found for: "CRCL"     Our next appointment is by telephone on 08/18/23 at 09:00 AM  Please call the care guide team at [phone] if you need to cancel or reschedule your appointment.   If you are experiencing a Mental Health or Behavioral Health Crisis or need someone to talk to, please call 1-800-273-TALK (toll free, 24 hour hotline)  Patient verbalizes understanding of instructions and care plan provided today and agrees to view in MyChart. Active MyChart status and patient understanding of how to access instructions and care plan via MyChart confirmed with patient.     Delsa Sale, RN, BSN, CCM Care Management Coordinator [id number] Care Management Direct Phone: [phone]

## 2023-06-16 NOTE — Patient Outreach (Addendum)
Care Coordination   Follow Up Visit Note   06/16/2023 Name: Lee Barnes MRN: [id number] DOB: 03/13/1951  Lee Barnes is a 72 y.o. year old male who sees Ronnald Nian, MD for primary care. I spoke with Vonzella Nipple by phone today.  What matters to the patients health and wellness today?  Wife would like to receive patient's non-vented Lee Barnes suctions from [employer] [facility].     Goals Addressed             This Visit's Progress    Avoid Nutritional Imbalance   On track    Care Coordination Interventions: Completed successful inbound call with wife [name] Evaluation of current treatment plan related to nutritional imbalance  and patient's adherence to plan as established by provider Discussed and reviewed with wife [name], patient is maintaining his weight in the 160's, he is having minimal diarrhea and few episodes of vomiting  Discussed she may start introducing small boluses of tube feedings Determined wife [name] is aware of signs/symptoms of aspiration and when to call the doctor if symptoms occur, wife states his vomiting has subsided      No complications from aspirational pnemonia   On track    Care Coordination Interventions: Completed successful call with wife [name] Evaluation of current treatment plan related to aspirational pneumonia  and patient's adherence to plan as established by provider Discussed with wife [name] that patient received some replacement tubing for his suction, however she received the incorrect Lee Barnes suction  Discussed [name] contacted [facility] to request the correct non-vented Lee Barnes suctions but has yet to receive them Instructed [name] to contact this RN if further assistance is needed Per wife [name], patient's vomiting has subsided with no further evidence of aspiration  Instructed [name] to contact patient's doctor to report new symptoms or concerns   To monitor BP at home Care Coordination Interventions: Evaluation of current  treatment plan related to hypertension self management and patient's adherence to plan as established by provider Advised patient, providing education and rationale, to monitor blood pressure daily and record, calling PCP for findings outside established parameters Advised patient to discuss BP log  with provider Discussed complications of poorly controlled blood pressure such as heart disease, stroke, circulatory complications, vision complications, kidney impairment, sexual dysfunction Mailed printed educational materials related to Hypertension Last practice recorded BP readings:  BP Readings from Last 3 Encounters:  04/10/23 134/88  03/26/23 126/88  03/13/23 112/66   Most recent eGFR/CrCl:  Lab Results  Component Value Date   EGFR 79 03/26/2023    No components found for: "CRCL"    Interventions Today    Flowsheet Row Most Recent Value  Chronic Disease   Chronic disease during today's visit Hypertension (HTN)  General Interventions   General Interventions Discussed/Reviewed General Interventions Discussed, General Interventions Reviewed, Durable Medical Equipment (DME), Doctor Visits  Durable Medical Equipment (DME) Other, BP Cuff  [suction equipment]  Education Interventions   Education Provided Provided Education, Provided Therapist, sports  Provided Verbal Education On Medication, Other  [blood pressure monitoring]  Nutrition Interventions   Nutrition Discussed/Reviewed Nutrition Discussed, Nutrition Reviewed  [J-G tube feedings]  Pharmacy Interventions   Pharmacy Dicussed/Reviewed Pharmacy Topics Reviewed, Pharmacy Topics Discussed, Medications and their functions          SDOH assessments and interventions completed:  No     Care Coordination Interventions:  Yes, provided   Follow up plan: Follow up call scheduled for 08/18/23 @09 :00 AM    Encounter  Outcome:  Pt. Visit Completed

## 2023-06-22 ENCOUNTER — Other Ambulatory Visit: Payer: Self-pay | Admitting: Family Medicine

## 2023-06-23 NOTE — Telephone Encounter (Signed)
Sending to pharmacist to see if she can get this approved.   Elnita Maxwell, Can you look at this & see my appeal letter & see if there is anything you could do for this patient to get this approved,  Thanks.

## 2023-06-25 ENCOUNTER — Telehealth: Payer: Self-pay

## 2023-06-25 NOTE — Progress Notes (Signed)
   06/25/2023  Patient ID: Lee Barnes, male   DOB: 12-24-1950, 72 y.o.   MRN: [id number]  Clinic routed request from Dr. Jola Babinski office to assist with affordability of patient's omeprazole prescription.  Patient receives medication via tube, and beads from inside of omeprazole capsules would not fully dissolve and clog the line.  Omeprazole suspension was prescribed, but insurance denied the claim; and even with an appeal, their decision was upheld.  Nexium capsules, which are available in a generic formulation (esomeprazole), are actually the preferred PPI to be given via feeding tube.  It is recommended to open the capsule, pour granules in a 60ml syringe and mix with 50ml of water- it does need to be shaken vigorously and given immediately.  The tube also needs to be flushed after with water.  If this is not an option or does not work well, there may still be compounding pharmacies that make PPI suspensions (omeprazole, lansoprazole, or pantoprazole).  They will not usually bill insurance, but they could always ask or see what the cost without would be.  Lenna Gilford, PharmD, DPLA

## 2023-06-27 NOTE — Telephone Encounter (Signed)
See note from pharmacist & see if you are ok switching to this?

## 2023-07-01 ENCOUNTER — Telehealth: Payer: Self-pay | Admitting: Family Medicine

## 2023-07-01 ENCOUNTER — Other Ambulatory Visit (HOSPITAL_COMMUNITY): Payer: Self-pay

## 2023-07-01 MED ORDER — ESOMEPRAZOLE MAGNESIUM 40 MG PO CPDR
DELAYED_RELEASE_CAPSULE | ORAL | 1 refills | Status: DC
  Filled 2023-07-01: qty 90, 90d supply, fill #0

## 2023-07-01 NOTE — Telephone Encounter (Signed)
Called wife & left message regarding new Rx Nexium.  Need to give info regarding the Nexium & instructions per pharmacist Sabino Niemann see 06/25/23 note

## 2023-07-02 NOTE — Telephone Encounter (Signed)
Spoke with wife [name] & went over medication change & instructions, she is to call if she has any issues

## 2023-07-07 ENCOUNTER — Other Ambulatory Visit (HOSPITAL_BASED_OUTPATIENT_CLINIC_OR_DEPARTMENT_OTHER): Payer: Self-pay

## 2023-07-09 NOTE — Telephone Encounter (Signed)
handled

## 2023-07-11 ENCOUNTER — Other Ambulatory Visit (HOSPITAL_COMMUNITY): Payer: Self-pay

## 2023-07-15 DIAGNOSIS — I10 Essential (primary) hypertension: Secondary | ICD-10-CM | POA: Diagnosis not present

## 2023-07-15 DIAGNOSIS — I951 Orthostatic hypotension: Secondary | ICD-10-CM | POA: Diagnosis not present

## 2023-07-22 ENCOUNTER — Encounter: Payer: Self-pay | Admitting: Licensed Clinical Social Worker

## 2023-07-23 ENCOUNTER — Ambulatory Visit (INDEPENDENT_AMBULATORY_CARE_PROVIDER_SITE_OTHER): Payer: Medicare Other | Admitting: Family Medicine

## 2023-07-23 VITALS — BP 128/78 | HR 88 | Ht 72.0 in | Wt 161.8 lb

## 2023-07-23 DIAGNOSIS — B379 Candidiasis, unspecified: Secondary | ICD-10-CM | POA: Diagnosis not present

## 2023-07-23 DIAGNOSIS — R319 Hematuria, unspecified: Secondary | ICD-10-CM | POA: Diagnosis not present

## 2023-07-23 DIAGNOSIS — H6123 Impacted cerumen, bilateral: Secondary | ICD-10-CM | POA: Diagnosis not present

## 2023-07-23 DIAGNOSIS — J386 Stenosis of larynx: Secondary | ICD-10-CM | POA: Diagnosis not present

## 2023-07-23 LAB — POCT URINALYSIS DIP (PROADVANTAGE DEVICE)
Bilirubin, UA: NEGATIVE
Glucose, UA: NEGATIVE mg/dL
Ketones, POC UA: NEGATIVE mg/dL
Leukocytes, UA: NEGATIVE
Nitrite, UA: NEGATIVE
Protein Ur, POC: NEGATIVE mg/dL
Specific Gravity, Urine: 1.005
Urobilinogen, Ur: 0.2
pH, UA: 7 (ref 5.0–8.0)

## 2023-07-23 NOTE — Progress Notes (Signed)
   Subjective:    Patient ID: Lee Barnes, male    DOB: 12-13-1950, 72 y.o.   MRN: [id number]  HPI He states this morning after he woke up he went into use the bathroom and noted blood in his urine.  His next several urinations show no evidence of blood.  No lower abdominal pain, discharge, urgency, hesitancy, decreased stream. No previous history of urologic issues including kidney stones.  Review of Systems     Objective:    Physical Exam Genital exam shows no evidence of blood in the urine this or other indicators of trauma.  Rectal exam showed a slightly enlarged nontender prostate       Assessment & Plan:  Hematuria, unspecified type - Plan: POCT Urinalysis DIP (Proadvantage Device), Urine Culture Discussed the fact that he has got asymptomatic hematuria and the differential is such that it could be anything from the kidney down to the urethra.  If he has any more blood in his urine, I will refer to urology

## 2023-07-25 LAB — URINE CULTURE

## 2023-07-30 ENCOUNTER — Ambulatory Visit: Payer: Self-pay | Admitting: Licensed Clinical Social Worker

## 2023-07-31 NOTE — Patient Outreach (Signed)
  Care Coordination   Follow Up Visit Note   07/30/2023 Name: Lee Barnes MRN: [id number] DOB: 1950/11/20  Lee Barnes is a 72 y.o. year old male who sees Lee Nian, MD for primary care. I spoke with  Lee Barnes's spouse, Luster Landsberg, by phone today.  What matters to the patients health and wellness today?  Caregiver Strain, Symptom Management    Goals Addressed             This Visit's Progress    Obtain Supportive Resources-Caregiver Fatigue   On track    Activities and task to complete in order to accomplish goals.   Keep all upcoming appointments discussed today Implement healthy coping skills discussed to assist with management of symptoms Continue working with [facility] care team to assist with goals identified          SDOH assessments and interventions completed:  No     Care Coordination Interventions:  Yes, provided  Interventions Today    Flowsheet Row Most Recent Value  Chronic Disease   Chronic disease during today's visit Hypertension (HTN)  General Interventions   General Interventions Discussed/Reviewed General Interventions Reviewed, Doctor Visits, Durable Medical Equipment (DME)  [Pt's urine sample was good. If concerns arise, PCP will refer to Urologist.]  Doctor Visits Discussed/Reviewed Doctor Visits Reviewed  Durable Medical Equipment (DME) Oxygen  [Pt and spouse continues to get charged for oxygen after numerous attempts to requesting agency to pick up o2. Validation and encouragement provided. Spouse will make additional attempts to return oxygen]  Mental Health Interventions   Mental Health Discussed/Reviewed Mental Health Reviewed, Coping Strategies  [Caregiver stress acknowledged. Spouse identified strategies to promote relaxation, self-care and caregiver balance]  Nutrition Interventions   Nutrition Discussed/Reviewed Nutrition Reviewed  Pharmacy Interventions   Pharmacy Dicussed/Reviewed Pharmacy Topics Reviewed, Medication  Adherence  [Pt obtained meds for thrush, symptoms are decreasing]  Safety Interventions   Safety Discussed/Reviewed Safety Reviewed       Follow up plan: Follow up call scheduled for 4-6 weeks    Encounter Outcome:  Patient Visit Completed   Jenel Lucks, MSW, LCSW [facility] Care Management [facility]  [facility] [email or web address] Phone [phone] 11:24 AM

## 2023-07-31 NOTE — Patient Instructions (Signed)
Visit Information  Thank you for taking time to visit with me today. Please don't hesitate to contact me if I can be of assistance to you.   Following are the goals we discussed today:   Goals Addressed             This Visit's Progress    Obtain Supportive Resources-Caregiver Fatigue   On track    Activities and task to complete in order to accomplish goals.   Keep all upcoming appointments discussed today Implement healthy coping skills discussed to assist with management of symptoms Continue working with [facility] care team to assist with goals identified          Our next appointment is by telephone on 11/06 at 10 AM  Please call the care guide team at [phone] if you need to cancel or reschedule your appointment.   If you are experiencing a Mental Health or Behavioral Health Crisis or need someone to talk to, please call the Suicide and Crisis Lifeline: 988 call 911   Patient verbalizes understanding of instructions and care plan provided today and agrees to view in MyChart. Active MyChart status and patient understanding of how to access instructions and care plan via MyChart confirmed with patient.     Jenel Lucks, MSW, LCSW [facility] Care Management [place]  [facility] [email or web address] Phone [phone] 11:24 AM

## 2023-08-12 DIAGNOSIS — L309 Dermatitis, unspecified: Secondary | ICD-10-CM | POA: Diagnosis not present

## 2023-08-12 DIAGNOSIS — Z85828 Personal history of other malignant neoplasm of skin: Secondary | ICD-10-CM | POA: Diagnosis not present

## 2023-08-12 DIAGNOSIS — L905 Scar conditions and fibrosis of skin: Secondary | ICD-10-CM | POA: Diagnosis not present

## 2023-08-12 DIAGNOSIS — L821 Other seborrheic keratosis: Secondary | ICD-10-CM | POA: Diagnosis not present

## 2023-08-12 DIAGNOSIS — L570 Actinic keratosis: Secondary | ICD-10-CM | POA: Diagnosis not present

## 2023-08-14 ENCOUNTER — Other Ambulatory Visit: Payer: Self-pay | Admitting: Family Medicine

## 2023-08-14 DIAGNOSIS — J3089 Other allergic rhinitis: Secondary | ICD-10-CM

## 2023-08-18 ENCOUNTER — Ambulatory Visit: Payer: Self-pay

## 2023-08-18 NOTE — Patient Instructions (Signed)
Visit Information  Thank you for taking time to visit with me today. Please don't hesitate to contact me if I can be of assistance to you.   Following are the goals we discussed today:   Goals Addressed             This Visit's Progress    To avoid getting COVID 19 from spouse       Care Coordination Interventions: Provided education to patient to enhance basic understanding of COVID-19 as a viral disease, measures to prevent exposure, signs and symptoms, recommended vaccine schedule, when to contact provider Advised patient to discuss symptoms suggestive of COVID 19 promptly with provider      To monitor BP at home   On track    Care Coordination Interventions: Evaluation of current treatment plan related to hypertension self management and patient's adherence to plan as established by provider Advised patient, providing education and rationale, to monitor blood pressure daily and record, calling PCP for findings outside established parameters Last practice recorded BP readings:  BP Readings from Last 3 Encounters:  07/23/23 128/78  04/10/23 134/88  03/26/23 126/88   Most recent eGFR/CrCl:  Lab Results  Component Value Date   EGFR 79 03/26/2023    No components found for: "CRCL"         Our next appointment is by telephone on 09/11/23 at 09:00 AM  Please call the care guide team at [phone] if you need to cancel or reschedule your appointment.   If you are experiencing a Mental Health or Behavioral Health Crisis or need someone to talk to, please call 1-800-273-TALK (toll free, 24 hour hotline)  Patient verbalizes understanding of instructions and care plan provided today and agrees to view in MyChart. Active MyChart status and patient understanding of how to access instructions and care plan via MyChart confirmed with patient.     Delsa Sale RN BSN CCM [place]  [facility], [facility] Health Nurse Care Coordinator  Direct Dial:  [phone] Website: [email or web address]

## 2023-08-18 NOTE — Patient Outreach (Signed)
Care Coordination   Follow Up Visit Note   08/18/2023 Name: Lee Barnes MRN: [id number] DOB: 1951-10-20  Lee Barnes is a 72 y.o. year old male who sees Ronnald Nian, MD for primary care. I spoke with  [name] by phone today.  What matters to the patients health and wellness today?  Patient would like to avoid getting COVID 19 from spouse.     Goals Addressed             This Visit's Progress    To avoid getting COVID 19 from spouse       Care Coordination Interventions: Provided education to patient to enhance basic understanding of COVID-19 as a viral disease, measures to prevent exposure, signs and symptoms, recommended vaccine schedule, when to contact provider Advised patient to discuss symptoms suggestive of COVID 19 promptly with provider     To monitor BP at home   On track    Care Coordination Interventions: Evaluation of current treatment plan related to hypertension self management and patient's adherence to plan as established by provider Advised patient, providing education and rationale, to monitor blood pressure daily and record, calling PCP for findings outside established parameters Last practice recorded BP readings:  BP Readings from Last 3 Encounters:  07/23/23 128/78  04/10/23 134/88  03/26/23 126/88   Most recent eGFR/CrCl:  Lab Results  Component Value Date   EGFR 79 03/26/2023    No components found for: "CRCL"    Interventions Today    Flowsheet Row Most Recent Value  Chronic Disease   Chronic disease during today's visit Hypertension (HTN), Other  [hematuria]  General Interventions   General Interventions Discussed/Reviewed General Interventions Discussed, General Interventions Reviewed, Doctor Visits  Doctor Visits Discussed/Reviewed Doctor Visits Discussed, Doctor Visits Reviewed, PCP  Education Interventions   Education Provided Provided Education  Provided Verbal Education On Nutrition, Medication, When to see the  doctor  Nutrition Interventions   Nutrition Discussed/Reviewed Nutrition Discussed, Nutrition Reviewed, Fluid intake  Pharmacy Interventions   Pharmacy Dicussed/Reviewed Pharmacy Topics Reviewed, Pharmacy Topics Discussed, Medications and their functions          SDOH assessments and interventions completed:  No     Care Coordination Interventions:  Yes, provided   Follow up plan: Follow up call scheduled for 09/11/23 @09 :00 AM     Encounter Outcome:  Patient Visit Completed

## 2023-08-20 ENCOUNTER — Encounter: Payer: Self-pay | Admitting: Family Medicine

## 2023-08-21 ENCOUNTER — Telehealth: Payer: Medicare Other | Admitting: Family Medicine

## 2023-08-21 ENCOUNTER — Encounter: Payer: Self-pay | Admitting: Family Medicine

## 2023-08-21 VITALS — BP 106/74 | Temp 98.2°F | Ht 72.0 in | Wt 158.0 lb

## 2023-08-21 DIAGNOSIS — J069 Acute upper respiratory infection, unspecified: Secondary | ICD-10-CM

## 2023-08-21 NOTE — Progress Notes (Signed)
Subjective:    Patient ID: Lee Barnes, male    DOB: 1951/07/20, 72 y.o.   MRN: [id number]  HPI Documentation for virtual audio and video telecommunications through Caregility encounter: The patient was located at home. 2 patient identifiers used.  The provider was located in the office. The patient did consent to this visit and is aware of possible charges through their insurance for this visit. The other persons participating in this telemedicine service was his wife. Time spent on call was 5 minutes and in review of previous records >20 minutes total for counseling and coordination of care. This virtual service is not related to other E/M service within previous 7 days.  His wife recently tested positive for COVID.  Yesterday evening he developed some slight worsening of postnasal drainage, nasal congestion however the cough is not changed from his normal clearing of the throat.  COVID test was negative.  No fever, chills, shortness of breath.  Review of Systems     Objective:    Physical Exam Alert and in no distress otherwise not examined.  His breathing pattern appeared normal.       Assessment & Plan:  Viral URI Recommend supportive care with Tylenol for the fever aches and pains and OTC meds for what ever symptoms he has.  Did recommend that he check again tomorrow and possibly even the next day.  I discussed the scenario if he is indeed positive his to placing him on Paxlovid.  He expressed understanding of this.

## 2023-08-22 ENCOUNTER — Telehealth: Payer: Self-pay

## 2023-08-22 ENCOUNTER — Telehealth: Payer: Self-pay | Admitting: Family Medicine

## 2023-08-22 ENCOUNTER — Other Ambulatory Visit: Payer: Self-pay | Admitting: Medical

## 2023-08-22 MED ORDER — PAXLOVID (300/100) 20 X 150 MG & 10 X 100MG PO TBPK: 3.0000 | ORAL_TABLET | Freq: Two times a day (BID) | ORAL | 0 refills | Status: AC

## 2023-08-22 NOTE — Telephone Encounter (Signed)
I sent him a mychart message back since he sent one about his positive covid

## 2023-08-22 NOTE — Telephone Encounter (Signed)
Pt. Wife called was very concerned because [name] has Covid and when he took his Paxlovid he vomited. She is worried about him getting dehydrated and wanted to now how much fluid to give him because he has a feeding tube a J tube and a G tube. She really wanted to talk to Dr. Susann Givens about this situation. I told her we would contact him and see if he could call her about [name] as soon as he could.

## 2023-08-22 NOTE — Telephone Encounter (Signed)
Pt's wife called, pt tested positive for covid today Feeling worse today, increased mucous, 1 episode of vomiting, using suction more Can't breath thru nose  Please advise

## 2023-08-25 ENCOUNTER — Ambulatory Visit: Payer: Self-pay

## 2023-08-25 NOTE — Patient Instructions (Signed)
Visit Information  Thank you for taking time to visit with me today. Please don't hesitate to contact me if I can be of assistance to you.   Following are the goals we discussed today:   Goals Addressed             This Visit's Progress    To recover from COVID without complications or hospitalization       Care Coordination Interventions: Provided education to patient to enhance basic understanding of COVID-19 as a viral disease, measures to prevent exposure, signs and symptoms, recommended vaccine schedule, when to contact provider Reviewed and discussed with wife Luster Landsberg, she will start implementing Pedialyte, via patient's j tube, 1 ounce at a time, she will increase his water to 32 ounces per day as tolerated, she will continue to keep his pump at 20 ml per hour to provide at least 1 carton of nutrition as tolerated Determined patient is afebrile and only requiring 1.5 lts of Oxygen supplementation and maintaining good oxygen sats Discussed patient has a "wet" sounding cough and continues to be very fatigued Reviewed and discussed with wife, today is #4 on Paxlovid and patient is tolerating  Educated wife on benefits of having patient use his incentive spirometer about 10 times per hour while awake or as close as possible to help keep lungs expanded, move trapped air and or mucous to help prevent pneumonia Assessed for wife's understanding of when to call the doctor if symptoms worsen or if new symptoms arise and she is able to verbalize and repeat appropriate response Discussed plans with patient for ongoing care coordination follow up and provided patient with direct contact information for nurse care coordinator Routed note to Dr. Susann Givens        Our next appointment is by telephone on 09/04/23 at 2:30 PM  Please call the care guide team at [phone] if you need to cancel or reschedule your appointment.   If you are experiencing a Mental Health or Behavioral Health Crisis or  need someone to talk to, please call 1-800-273-TALK (toll free, 24 hour hotline)  Patient verbalizes understanding of instructions and care plan provided today and agrees to view in MyChart. Active MyChart status and patient understanding of how to access instructions and care plan via MyChart confirmed with patient.     Delsa Sale RN BSN CCM [place]  [facility], [facility] Nurse Care Coordinator  Direct Dial: [phone] Website: [email or web address]

## 2023-08-25 NOTE — Patient Outreach (Signed)
Care Coordination   Follow Up Visit Note   08/25/2023 Name: Lee Barnes MRN: [id number] DOB: September 12, 1951  Lee Barnes is a 71 y.o. year old male who sees Ronnald Nian, MD for primary care. I spoke with wife Conley Deman by phone today.  What matters to the patients health and wellness today?  Patient/wife would like for patient to recover from COVID 19 without complications or hospitalization.     Goals Addressed             This Visit's Progress    To recover from COVID without complications or hospitalization       Care Coordination Interventions: Provided education to patient to enhance basic understanding of COVID-19 as a viral disease, measures to prevent exposure, signs and symptoms, recommended vaccine schedule, when to contact provider Reviewed and discussed with wife Luster Landsberg, she will start implementing Pedialyte, via patient's j tube, 1 ounce at a time, she will increase his water to 32 ounces per day as tolerated, she will continue to keep his pump at 20 ml per hour to provide at least 1 carton of nutrition as tolerated Determined patient is afebrile and only requiring 1.5 lts of Oxygen supplementation and maintaining good oxygen sats Discussed patient has a "wet" sounding cough and continues to be very fatigued Reviewed and discussed with wife, today is #4 on Paxlovid and patient is tolerating  Educated wife on benefits of having patient use his incentive spirometer about 10 times per hour while awake or as close as possible to help keep lungs expanded, move trapped air and or mucous to help prevent pneumonia Assessed for wife's understanding of when to call the doctor if symptoms worsen or if new symptoms arise and she is able to verbalize and repeat appropriate response Discussed plans with patient for ongoing care coordination follow up and provided patient with direct contact information for nurse care coordinator Routed note to Dr. Susann Givens     Interventions Today    Flowsheet Row Most Recent Value  Chronic Disease   Chronic disease during today's visit Other  [COVID 19 virus]  General Interventions   General Interventions Discussed/Reviewed General Interventions Discussed, General Interventions Reviewed, Doctor Visits, Durable Medical Equipment (DME)  Doctor Visits Discussed/Reviewed Doctor Visits Discussed, Doctor Visits Reviewed, PCP  Durable Medical Equipment (DME) Oxygen  Education Interventions   Education Provided Provided Education  Provided Verbal Education On Nutrition, When to see the doctor, Medication  Nutrition Interventions   Nutrition Discussed/Reviewed Nutrition Discussed, Nutrition Reviewed, Fluid intake  Pharmacy Interventions   Pharmacy Dicussed/Reviewed Pharmacy Topics Discussed, Pharmacy Topics Reviewed, Medications and their functions          SDOH assessments and interventions completed:  No     Care Coordination Interventions:  Yes, provided   Follow up plan: Follow up call scheduled for 09/04/23 @2 :30 PM    Encounter Outcome:  Patient Visit Completed

## 2023-08-27 ENCOUNTER — Ambulatory Visit: Payer: Self-pay

## 2023-08-27 NOTE — Patient Instructions (Signed)
Visit Information  Thank you for taking time to visit with me today. Please don't hesitate to contact me if I can be of assistance to you.   Following are the goals we discussed today:   Goals Addressed             This Visit's Progress    COMPLETED: To avoid getting COVID 19 from spouse       Care Coordination Interventions: See Duplicate Goal      To recover from COVID without complications or hospitalization   On track    Care Coordination Interventions: Completed successful call with wife [name] Determined patient is making progress towards wellness as he recovers from COVID 57 Discussed with wife patient is tolerating his fluids, water and Pedialyte via his G tube and J tube, wife is slowing reintroducing patient's formula, he has completed his Paxlovid  Determined patient is maintaining his Oxygen sats with 1 lt, although he did experience a drop in Oxygen down to 82% on RA while attempting to shower Educated wife on how to instruct patient to perform pursed lip breathing and encouraged patient perform deep breathing exercises 5-10 times per hour during wake hours Educated wife regarding where to purchase an incentive spirometer for ongoing use and help with deep breathing  Discussed plans with patient for ongoing care coordination follow up and provided patient with direct contact information for nurse care coordinator         Our next appointment is by telephone on 09/04/23 at 10:30 AM  Please call the care guide team at [phone] if you need to cancel or reschedule your appointment.   If you are experiencing a Mental Health or Behavioral Health Crisis or need someone to talk to, please call 1-800-273-TALK (toll free, 24 hour hotline)  Patient verbalizes understanding of instructions and care plan provided today and agrees to view in MyChart. Active MyChart status and patient understanding of how to access instructions and care plan via MyChart confirmed with patient.      Delsa Sale RN BSN CCM [place]  [facility], [facility] Health Nurse Care Coordinator  Direct Dial: [phone] Website: [email or web address]

## 2023-08-27 NOTE — Patient Outreach (Signed)
Care Coordination   Follow Up Visit Note   08/27/2023 Name: WISE MOTHERWAY MRN: [id number] DOB: [birth year]  KLINT KAAIHUE is a 72 y.o. year old male who sees Ronnald Nian, MD for primary care. I spoke with wife Ackley Brixius by phone today.  What matters to the patients health and wellness today?  Wife is hopefull patient will recover from COVID 19 without complications.     Goals Addressed             This Visit's Progress    COMPLETED: To avoid getting COVID 19 from spouse       Care Coordination Interventions: See Duplicate Goal      To recover from COVID without complications or hospitalization   On track    Care Coordination Interventions: Completed successful call with wife [name] Determined patient is making progress towards wellness as he recovers from COVID 61 Discussed with wife patient is tolerating his fluids, water and Pedialyte via his G tube and J tube, wife is slowing reintroducing patient's formula, he has completed his Paxlovid  Determined patient is maintaining his Oxygen sats with 1 lt, although he did experience a drop in Oxygen down to 82% on RA while attempting to shower Educated wife on how to instruct patient to perform pursed lip breathing and encouraged patient perform deep breathing exercises 5-10 times per hour during wake hours Educated wife regarding where to purchase an incentive spirometer for ongoing use and help with deep breathing  Discussed plans with patient for ongoing care coordination follow up and provided patient with direct contact information for nurse care coordinator     Interventions Today    Flowsheet Row Most Recent Value  Chronic Disease   Chronic disease during today's visit Other  [COVID 19 virus]  General Interventions   General Interventions Discussed/Reviewed General Interventions Discussed, General Interventions Reviewed, Doctor Visits  Durable Medical Equipment (DME) Oxygen  Education Interventions   Education  Provided Provided Education  Provided Verbal Education On Nutrition, When to see the doctor, Medication  Nutrition Interventions   Nutrition Discussed/Reviewed Nutrition Discussed, Nutrition Reviewed, Fluid intake  Pharmacy Interventions   Pharmacy Dicussed/Reviewed Pharmacy Topics Discussed, Pharmacy Topics Reviewed, Medications and their functions         SDOH assessments and interventions completed:  No     Care Coordination Interventions:  Yes, provided   Follow up plan: Follow up call scheduled for 09/04/23 @10 :30 AM    Encounter Outcome:  Patient Visit Completed

## 2023-09-04 ENCOUNTER — Ambulatory Visit: Payer: Self-pay

## 2023-09-04 NOTE — Patient Instructions (Signed)
Visit Information  Thank you for taking time to visit with me today. Please don't hesitate to contact me if I can be of assistance to you.   Following are the goals we discussed today:   Goals Addressed             This Visit's Progress    COMPLETED: To recover from COVID without complications or hospitalization       Care Coordination Interventions: Completed successful call with wife [name] Determined patient has made a full recovery from COVID 19 without complications or hospitalization  Discussed with wife [name] patient is now tolerating his full feeding schedule (5 cartons per day) and full hydration (48-64 oz daily intake) Determined wife will schedule an in person follow up with Dr. Susann Givens for a post COVID 19 check up, otherwise she reports having no further questions or concerns at this time         Our next appointment is by telephone on 11/04/23 at 10:30 AM  Please call the care guide team at [phone] if you need to cancel or reschedule your appointment.   If you are experiencing a Mental Health or Behavioral Health Crisis or need someone to talk to, please call 1-800-273-TALK (toll free, 24 hour hotline)  Patient verbalizes understanding of instructions and care plan provided today and agrees to view in MyChart. Active MyChart status and patient understanding of how to access instructions and care plan via MyChart confirmed with patient.     Delsa Sale RN BSN CCM   [facility], [facility] Health Nurse Care Coordinator  Direct Dial: [phone] Website: [email or web address]

## 2023-09-04 NOTE — Patient Outreach (Signed)
Care Coordination   Follow Up Visit Note   09/04/2023 Name: Lee Barnes MRN: [id number] DOB: [birth year]  Lee Barnes is a 72 y.o. year old male who sees Ronnald Nian, MD for primary care. I spoke with [name] by phone today.  What matters to the patients health and wellness today?  Wife would like to have patient complete an in person visit with his PCP for a post COVID 19 checkup.     Goals Addressed             This Visit's Progress    COMPLETED: To recover from COVID without complications or hospitalization       Care Coordination Interventions: Completed successful call with [name] Determined patient has made a full recovery from COVID 19 without complications or hospitalization  Discussed with [name] patient is now tolerating his full feeding schedule (5 cartons per day) and full hydration (48-64 oz daily intake) Determined wife will schedule an in person follow up with Dr. Susann Givens for a post COVID 19 check up, otherwise she reports having no further questions or concerns at this time     Interventions Today    Flowsheet Row Most Recent Value  Chronic Disease   Chronic disease during today's visit Other  [s/p COVID 19]  General Interventions   General Interventions Discussed/Reviewed General Interventions Discussed, General Interventions Reviewed, Doctor Visits  Doctor Visits Discussed/Reviewed PCP, Doctor Visits Reviewed, Doctor Visits Discussed  Education Interventions   Education Provided Provided Education  Provided Verbal Education On When to see the doctor, Nutrition, Medication  Nutrition Interventions   Nutrition Discussed/Reviewed Nutrition Discussed, Nutrition Reviewed, Supplemental nutrition  Pharmacy Interventions   Pharmacy Dicussed/Reviewed Pharmacy Topics Discussed, Pharmacy Topics Reviewed          SDOH assessments and interventions completed:  No     Care Coordination Interventions:  Yes, provided   Follow up  plan: Follow up call scheduled for 11/04/23 @10 :30 AM    Encounter Outcome:  Patient Visit Completed

## 2023-09-09 ENCOUNTER — Encounter: Payer: Self-pay | Admitting: Family Medicine

## 2023-09-09 DIAGNOSIS — Z8701 Personal history of pneumonia (recurrent): Secondary | ICD-10-CM

## 2023-09-09 DIAGNOSIS — Z85819 Personal history of malignant neoplasm of unspecified site of lip, oral cavity, and pharynx: Secondary | ICD-10-CM

## 2023-09-09 MED ORDER — ALBUTEROL SULFATE (2.5 MG/3ML) 0.083% IN NEBU: 2.5000 mg | INHALATION_SOLUTION | RESPIRATORY_TRACT | 5 refills | Status: DC | PRN

## 2023-09-11 ENCOUNTER — Encounter: Payer: Self-pay | Admitting: Family Medicine

## 2023-09-11 ENCOUNTER — Ambulatory Visit: Payer: Medicare Other | Admitting: Family Medicine

## 2023-09-11 VITALS — BP 122/72 | HR 94 | Ht 72.0 in | Wt 158.6 lb

## 2023-09-11 DIAGNOSIS — Z23 Encounter for immunization: Secondary | ICD-10-CM

## 2023-09-11 DIAGNOSIS — B352 Tinea manuum: Secondary | ICD-10-CM | POA: Diagnosis not present

## 2023-09-11 DIAGNOSIS — Z8616 Personal history of COVID-19: Secondary | ICD-10-CM | POA: Diagnosis not present

## 2023-09-11 NOTE — Progress Notes (Signed)
Subjective:    Patient ID: Lee Barnes, male    DOB: 06-Jun-1951, 72 y.o.   MRN: [id number]  HPI He is here for follow-up after getting COVID on September 27.  He did have difficulty with increased secretions, weakness, cough and needed oxygen at home for supplementation.  His wife worked diligently about keeping him hydrated through this timeframe because of his underlying cancer.  There have also been issues in the past with electrolyte imbalances.  Presently his essentially back to normal not having any more trouble with cough, congestion or other issues.  He does note possible fungal infection of the right ring finger.   Review of Systems     Objective:    Physical Exam Alert and in no distress.  Cardiac exam shows regular rhythm without murmurs or gallops.  Lungs are clear to auscultation.  Exam of the right third finger does show evidence of thickening but also the nail base is clear.       Assessment & Plan:   History of COVID-19 - Plan: CBC with Differential/Platelet, Comprehensive metabolic panel  Need for influenza vaccination - Plan: Flu Vaccine Trivalent High Dose (Fluad)  Tinea manus I will do routine screening to make sure his electrolytes are okay.  Flu shot given today.  I also explained that since it looks like the nail is clearing on its own no intervention needed.  Also recommend to get the RSV at the drugstore.

## 2023-09-12 LAB — COMPREHENSIVE METABOLIC PANEL
ALT: 20 [IU]/L (ref 0–44)
AST: 19 [IU]/L (ref 0–40)
Albumin: 4.2 g/dL (ref 3.8–4.8)
Alkaline Phosphatase: 133 [IU]/L — ABNORMAL HIGH (ref 44–121)
BUN/Creatinine Ratio: 27 — ABNORMAL HIGH (ref 10–24)
BUN: 29 mg/dL — ABNORMAL HIGH (ref 8–27)
Bilirubin Total: 0.2 mg/dL (ref 0.0–1.2)
CO2: 24 mmol/L (ref 20–29)
Calcium: 9.6 mg/dL (ref 8.6–10.2)
Chloride: 96 mmol/L (ref 96–106)
Creatinine, Ser: 1.09 mg/dL (ref 0.76–1.27)
Globulin, Total: 3.5 g/dL (ref 1.5–4.5)
Glucose: 89 mg/dL (ref 70–99)
Potassium: 5.6 mmol/L — ABNORMAL HIGH (ref 3.5–5.2)
Sodium: 135 mmol/L (ref 134–144)
Total Protein: 7.7 g/dL (ref 6.0–8.5)
eGFR: 73 mL/min/{1.73_m2} (ref >59)

## 2023-09-12 LAB — CBC WITH DIFFERENTIAL/PLATELET
Basophils Absolute: 0 10*3/uL (ref 0.0–0.2)
Basos: 0 %
EOS (ABSOLUTE): 0.1 10*3/uL (ref 0.0–0.4)
Eos: 2 %
Hematocrit: 37.2 % — ABNORMAL LOW (ref 37.5–51.0)
Hemoglobin: 11.8 g/dL — ABNORMAL LOW (ref 13.0–17.7)
Immature Grans (Abs): 0 10*3/uL (ref 0.0–0.1)
Immature Granulocytes: 1 %
Lymphocytes Absolute: 0.5 10*3/uL — ABNORMAL LOW (ref 0.7–3.1)
Lymphs: 10 %
MCH: 28.7 pg (ref 26.6–33.0)
MCHC: 31.7 g/dL (ref 31.5–35.7)
MCV: 91 fL (ref 79–97)
Monocytes Absolute: 0.7 10*3/uL (ref 0.1–0.9)
Monocytes: 12 %
Neutrophils Absolute: 4.1 10*3/uL (ref 1.4–7.0)
Neutrophils: 75 %
Platelets: 326 10*3/uL (ref 150–450)
RBC: 4.11 x10E6/uL — ABNORMAL LOW (ref 4.14–5.80)
RDW: 13.8 % (ref 11.6–15.4)
WBC: 5.5 10*3/uL (ref 3.4–10.8)

## 2023-09-24 ENCOUNTER — Ambulatory Visit: Payer: Self-pay | Admitting: Licensed Clinical Social Worker

## 2023-09-25 NOTE — Patient Instructions (Signed)
Visit Information  Thank you for taking time to visit with me today. Please don't hesitate to contact me if I can be of assistance to you.   Following are the goals we discussed today:   Goals Addressed             This Visit's Progress    Obtain Supportive Resources-Caregiver Fatigue   On track    Activities and task to complete in order to accomplish goals.   Keep all upcoming appointments discussed today Implement healthy coping skills discussed to assist with management of symptoms Continue working with [facility] care team to assist with goals identified          Our next appointment is by telephone on 01/15 at 10 AM  Please call the care guide team at [phone] if you need to cancel or reschedule your appointment.   If you are experiencing a Mental Health or Behavioral Health Crisis or need someone to talk to, please call the Suicide and Crisis Lifeline: 988 call 911   Patient verbalizes understanding of instructions and care plan provided today and agrees to view in MyChart. Active MyChart status and patient understanding of how to access instructions and care plan via MyChart confirmed with patient.     Jenel Lucks, MSW, LCSW [facility] Care Management [place]  [facility] [email or web address] Phone [phone] 5:19 AM

## 2023-09-25 NOTE — Patient Outreach (Signed)
  Care Coordination   Follow Up Visit Note   09/24/2023 Name: Lee Barnes MRN: [id number] DOB: 22-Apr-1951  Lee Barnes is a 72 y.o. year old male who sees Ronnald Nian, MD for primary care. I spoke with  Prentice Docker Ribaudo's spouse, Luster Landsberg, by phone today.  What matters to the patients health and wellness today?  Symptom Management and DME    Goals Addressed             This Visit's Progress    Obtain Supportive Resources-Caregiver Fatigue   On track    Activities and task to complete in order to accomplish goals.   Keep all upcoming appointments discussed today Implement healthy coping skills discussed to assist with management of symptoms Continue working with [facility] care team to assist with goals identified          SDOH assessments and interventions completed:  No     Care Coordination Interventions:  Yes, provided  Interventions Today    Flowsheet Row Most Recent Value  Chronic Disease   Chronic disease during today's visit Hypertension (HTN), Other  [Hx of Acute respiratory failure, s/p Covid]  General Interventions   General Interventions Discussed/Reviewed General Interventions Reviewed, Level of Care, Doctor Visits, Durable Medical Equipment (DME), Community Resources  [Pt's spouse shared that both she and pt was exposed to Covid a couple of months ago. Continued to coordinate with PCP about tx and hired caregivers to assist with elderly parents. Discussed strategies to assist with memory and tx planning.]  Doctor Visits Discussed/Reviewed Doctor Visits Reviewed  Durable Medical Equipment (DME) Oxygen  [Family paying out of pocket for Oxygen, discussed strategies to pay for DME]  Mental Health Interventions   Mental Health Discussed/Reviewed Mental Health Reviewed, Coping Strategies  [Stress management (managing chronic health conditions) strategies discussed. Encouraged continued self-care and prioritizing needs. Identified strengths. Going to babysit  grandchildren next week in [place]]  Nutrition Interventions   Nutrition Discussed/Reviewed Nutrition Reviewed  Pharmacy Interventions   Pharmacy Dicussed/Reviewed Pharmacy Topics Reviewed, Medication Adherence  Safety Interventions   Safety Discussed/Reviewed Safety Reviewed       Follow up plan: Follow up call scheduled for 6-8 weeks    Encounter Outcome:  Patient Visit Completed   Jenel Lucks, MSW, LCSW [facility] Care Management [facility]  [facility] [email or web address] Phone [phone] 5:18 AM

## 2023-10-08 DIAGNOSIS — R633 Feeding difficulties, unspecified: Secondary | ICD-10-CM | POA: Diagnosis not present

## 2023-11-04 ENCOUNTER — Ambulatory Visit: Payer: Self-pay

## 2023-11-04 NOTE — Patient Outreach (Signed)
  Care Coordination   11/04/2023 Name: Lee Barnes MRN: [id number] DOB: 22-Aug-1951   Care Coordination Outreach Attempts:  An unsuccessful outreach was attempted for an appointment today.  Follow Up Plan:  Additional outreach attempts will be made to offer the patient complex care management information and services.   Encounter Outcome:  No Answer   Care Coordination Interventions:  No, not indicated    Delsa Sale RN BSN CCM [place]  [facility], [facility] Health Nurse Care Coordinator  Direct Dial: [phone] Website: [email or web address]

## 2023-11-10 ENCOUNTER — Emergency Department (HOSPITAL_COMMUNITY)
Admission: EM | Admit: 2023-11-10 | Discharge: 2023-11-10 | Disposition: A | Discharge status: Home / Self Care | Payer: Medicare Other | Attending: Emergency Medicine | Admitting: Emergency Medicine

## 2023-11-10 ENCOUNTER — Encounter (HOSPITAL_COMMUNITY): Payer: Self-pay

## 2023-11-10 ENCOUNTER — Emergency Department (HOSPITAL_COMMUNITY): Payer: Medicare Other

## 2023-11-10 ENCOUNTER — Other Ambulatory Visit: Payer: Self-pay

## 2023-11-10 DIAGNOSIS — K9419 Other complications of enterostomy: Secondary | ICD-10-CM | POA: Diagnosis not present

## 2023-11-10 DIAGNOSIS — K9423 Gastrostomy malfunction: Secondary | ICD-10-CM | POA: Diagnosis not present

## 2023-11-10 DIAGNOSIS — Z931 Gastrostomy status: Secondary | ICD-10-CM

## 2023-11-10 HISTORY — PX: IR REPLC GASTRO/COLONIC TUBE PERCUT W/FLUORO: IMG2333

## 2023-11-10 MED ORDER — IOHEXOL 300 MG/ML  SOLN
50.0000 mL | Freq: Once | INTRAMUSCULAR | Status: AC | PRN
  Administered 2023-11-10: 20 mL

## 2023-11-10 MED ORDER — LIDOCAINE VISCOUS HCL 2 % MT SOLN
OROMUCOSAL | Status: AC
  Filled 2023-11-10: qty 15

## 2023-11-10 NOTE — ED Notes (Signed)
Patient is back from IR.

## 2023-11-10 NOTE — ED Notes (Signed)
Patient transported to IR 

## 2023-11-10 NOTE — Progress Notes (Signed)
Patient ID: ALIAS GRAHN, male   DOB: [birth year], 72 y.o.   MRN: [id number] Pt presented to [facility] ED today after GJ tube became dislodged when dog jumped on pt's abd region. A new 22 fr entuit G tube was inserted into tract and balloon inflated with 10 cc saline. Tube was cinced to skin. Pt will be set up for exchange of tube for new GJ tube later today.

## 2023-11-10 NOTE — ED Provider Notes (Signed)
Received signout from previous provider, please see his note for complete H&P.  This is a 72 year old male presenting with concerns of his JG tube being dislodged.  It happened earlier today.  This is an established GJ tube that he has had for a while.  He is currently awaiting for IR for GJ tube placement.  Patient without any other complaint.  6:00 PM G-tube has been successfully replaced by interventional radiology.  Patient to return to vascular interventional radiology for routine evaluation and exchange in 6 months.  Patient agrees with plan at this time he is stable for discharge.  BP (!) 143/84 (BP Location: Left Arm)   Pulse 76   Temp 98.3 F (36.8 C) (Oral)   Resp 18   Ht 6' (1.829 m)   Wt 72.6 kg   SpO2 100%   BMI 21.70 kg/m   Results for orders placed or performed in visit on 09/11/23  CBC with Differential/Platelet   Collection Time: 09/11/23 11:34 AM  Result Value Ref Range   WBC 5.5 3.4 - 10.8 x10E3/uL   RBC 4.11 (L) 4.14 - 5.80 x10E6/uL   Hemoglobin 11.8 (L) 13.0 - 17.7 g/dL   Hematocrit 13.0 (L) 86.5 - 51.0 %   MCV 91 79 - 97 fL   MCH 28.7 26.6 - 33.0 pg   MCHC 31.7 31.5 - 35.7 g/dL   RDW 78.4 69.6 - 29.5 %   Platelets 326 150 - 450 x10E3/uL   Neutrophils 75 Not Estab. %   Lymphs 10 Not Estab. %   Monocytes 12 Not Estab. %   Eos 2 Not Estab. %   Basos 0 Not Estab. %   Neutrophils Absolute 4.1 1.4 - 7.0 x10E3/uL   Lymphocytes Absolute 0.5 (L) 0.7 - 3.1 x10E3/uL   Monocytes Absolute 0.7 0.1 - 0.9 x10E3/uL   EOS (ABSOLUTE) 0.1 0.0 - 0.4 x10E3/uL   Basophils Absolute 0.0 0.0 - 0.2 x10E3/uL   Immature Granulocytes 1 Not Estab. %   Immature Grans (Abs) 0.0 0.0 - 0.1 x10E3/uL  Comprehensive metabolic panel   Collection Time: 09/11/23 11:34 AM  Result Value Ref Range   Glucose 89 70 - 99 mg/dL   BUN 29 (H) 8 - 27 mg/dL   Creatinine, Ser 2.84 0.76 - 1.27 mg/dL   eGFR 73 >13 KG/MWN/0.27   BUN/Creatinine Ratio 27 (H) 10 - 24   Sodium 135 134 - 144 mmol/L    Potassium 5.6 (H) 3.5 - 5.2 mmol/L   Chloride 96 96 - 106 mmol/L   CO2 24 20 - 29 mmol/L   Calcium 9.6 8.6 - 10.2 mg/dL   Total Protein 7.7 6.0 - 8.5 g/dL   Albumin 4.2 3.8 - 4.8 g/dL   Globulin, Total 3.5 1.5 - 4.5 g/dL   Bilirubin Total 0.2 0.0 - 1.2 mg/dL   Alkaline Phosphatase 133 (H) 44 - 121 IU/L   AST 19 0 - 40 IU/L   ALT 20 0 - 44 IU/L   IR REPLACE GASTRO/JEJUNO TUBE PERC W/FLUORO Result Date: 11/10/2023 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 EXAM: FLUOROSCOPIC GUIDED REPLACEMENT OF GASTROJEJUNOSTOMY TUBE COMPARISON:  IR fluoroscopy, 03/19/2023 MEDICATIONS: None. CONTRAST:  20mL OMNIPAQUE IOHEXOL 300 MG/ML  SOLN FLUOROSCOPY TIME:  Fluoroscopic dose; 13 mGy COMPLICATIONS: None. PROCEDURE: Informed written consent was obtained from the patient and/or patient's representative after a discussion of the risks and benefits. The upper abdomen and the external portion of the existing gastrostomy tube was prepped and draped in the usual sterile fashion,  and a sterile drape was applied covering the operative field. Maximum barrier sterile technique with sterile gowns and gloves were used for the procedure. A timeout was performed prior to the initiation of the procedure. Through the existing gastrostomy tube, a 5 Fr Kumpe catheter and glidewire were inserted via coaxial technique and used to probe the pylorus. The stiff guidewire was utilized to manipulate a Kumpe catheter into the proximal aspect of the small bowel. Contrast injection confirmed appropriate positioning. Under intermittent fluoroscopic guidance, a 22 Fr large-bore gastrojejunostomy catheter was advanced through with tip ultimately terminating within the proximal small bowel. Contrast injection via the jejunostomy and gastric lumens confirmed appropriate functioning and positioning. A dressing was placed. The patient tolerated procedure well without immediate postprocedural complication. IMPRESSION: Successful fluoroscopic guided  replacement of a 22 Fr gastrojejunostomy tube. The tip of the jejunostomy lumen lies within the proximal jejunum. Both lumens ready for immediate use. PLAN: The patient will return to Vascular Interventional Radiology (VIR) for routine feeding tube evaluation and exchange in 6 months. Roanna Banning, MD [facility] Radiology Electronically Signed   By: Roanna Banning M.D.   On: 11/10/2023 17:15      Fayrene Helper, PA-C 11/10/23 1800    Pricilla Loveless, MD 11/13/23 [phone]

## 2023-11-10 NOTE — ED Triage Notes (Signed)
Pt has JG Tube and puppy pulled tube out when jumping on pt. Pt put tube back in about 1 inch to keep hole open. Pt denies pain

## 2023-11-10 NOTE — Discharge Instructions (Signed)
Your tube was replaced by interventional radiology.  Follow-up outpatient as needed.  Return for any concerning symptoms.

## 2023-11-10 NOTE — ED Provider Notes (Signed)
[place] EMERGENCY DEPARTMENT AT [facility] Provider Note   CSN: [id number] Arrival date & time: 11/10/23  1125     History  No chief complaint on file.   Lee Barnes is a 72 y.o. male.  72 year old male presents today for concern of his GJ tube being dislodged.  His puppy jumped up and yanked out the tube.  He reached out to his gastroenterologist who recommended he come into the emergency department.  He gets all of his feedings and medications through the tube.  He previously had a G-tube in place however due to recurrent aspiration pneumonias this was changed to a GJ tube.  The history is provided by the patient. No language interpreter was used.       Home Medications Prior to Admission medications   Medication Sig Start Date End Date Taking? Authorizing Provider  acetaminophen (TYLENOL) 325 MG tablet Place 2 tablets (650 mg total) into feeding tube every 6 (six) hours as needed for mild pain (or Fever >/= 101). Patient not taking: Reported on 07/23/2023 02/05/23   Alwyn Ren, MD  albuterol (PROVENTIL) (2.5 MG/3ML) 0.083% nebulizer solution Inhale 3 mLs (2.5 mg total) by nebulization every 4 (four) hours as needed for wheezing. 09/09/23   Ronnald Nian, MD  cetirizine (ZYRTEC) 10 MG chewable tablet Place 10 mg into feeding tube daily as needed for allergies or rhinitis.    [provider]  diphenhydrAMINE (BENADRYL) 25 MG tablet Take 12.5 mg by mouth at bedtime as needed for allergies (to dry secretions).    [provider]  esomeprazole (NEXIUM) 40 MG capsule Open capsule, pour granules in a 60ml syringe & mix with 50 ml of water, shake vigorously & give immediately via feeding tube.  The tube also needs to be flushed after with water Patient not taking: Reported on 08/21/2023 07/01/23   Ronnald Nian, MD  ferrous sulfate 300 (60 Fe) MG/5ML syrup Take 5 mLs (300 mg total) by mouth daily. 12/30/22 09/11/23  Regalado, Belkys A, MD   fluticasone (FLONASE) 50 MCG/ACT nasal spray SPRAY TWO SPRAYS IN EACH NOSTRIL ONCE DAILY 08/15/23   Ronnald Nian, MD  guaiFENesin (ROBITUSSIN) 100 MG/5ML liquid Place 30 mLs into feeding tube 2 (two) times daily. 12/30/22   Regalado, Belkys A, MD  hydrALAZINE (APRESOLINE) 25 MG tablet Place 1 tablet (25 mg total) into feeding tube every 8 (eight) hours as needed (for SBP greater than 175). Patient not taking: Reported on 07/23/2023 02/05/23   Alwyn Ren, MD  midodrine (PROAMATINE) 5 MG tablet Take 5 mg by mouth 3 (three) times daily with meals as needed (low BP). Patient not taking: Reported on 09/11/2023    [provider]  Nutritional Supplements (FEEDING SUPPLEMENT, OSMOLITE 1.5 CAL,) LIQD Place 1,425 mLs into feeding tube daily. 10/16/22   Narda Bonds, MD  ondansetron [facility]) 4 MG/5ML solution Place 4 mg into feeding tube every 8 (eight) hours as needed for nausea or vomiting.    [provider]  scopolamine (TRANSDERM-SCOP) 1 MG/3DAYS Place 1 patch (1.5 mg total) onto the skin every 3 (three) days. Patient not taking: Reported on 07/23/2023 02/06/23   Alwyn Ren, MD  sodium chloride (OCEAN) 0.65 % SOLN nasal spray Place 1 spray into both nostrils as needed for congestion. 12/30/22   Regalado, Belkys A, MD  sodium chloride HYPERTONIC 3 % nebulizer solution Take 4 mLs by nebulization daily. 12/31/22   Regalado, Prentiss Bells, MD  Allergies    Codeine    Review of Systems   Review of Systems  Constitutional:  Negative for chills and fever.  Skin:  Positive for wound (GJ tube site mild erythema surrounding it.  No signs of infection though.).  All other systems reviewed and are negative.   Physical Exam Updated Vital Signs BP (!) 156/90 (BP Location: Left Arm)   Pulse 84   Temp 98.9 F (37.2 C) (Oral)   Resp 18   Ht 6' (1.829 m)   Wt 72.6 kg   SpO2 98%   BMI 21.70 kg/m  Physical Exam  ED Results / Procedures / Treatments   Labs (all labs  ordered are listed, but only abnormal results are displayed) Labs Reviewed - No data to display  EKG None  Radiology No results found.  Procedures Procedures    Medications Ordered in ED Medications - No data to display  ED Course/ Medical Decision Making/ A&P                                 Medical Decision Making  72 year old male presents today for concern of GJ tube being dislodged.  He receives all of his nutrients as well as medications through this.  Interventional radiology was consulted.  They placed a G-tube.  As a placeholder until they come up with a plan.  The interventional radiologist is currently in a procedure and the PA will discuss with him and let us know the plan.  In the meantime patient will stay in the emergency department.  Signed out to oncoming provider to follow-up on the recommendations from interventional radiologist.   Final Clinical Impression(s) / ED Diagnoses Final diagnoses:  PEG tube malfunction [facility])    Rx / DC Orders ED Discharge Orders     None         Marita [place], PA-C 11/10/23 1458    Ernie Avena, MD 11/10/23 [phone]

## 2023-11-11 ENCOUNTER — Ambulatory Visit: Payer: Medicare Other

## 2023-11-11 ENCOUNTER — Other Ambulatory Visit (HOSPITAL_COMMUNITY): Payer: Self-pay | Admitting: Interventional Radiology

## 2023-11-11 DIAGNOSIS — Z931 Gastrostomy status: Secondary | ICD-10-CM

## 2023-11-11 DIAGNOSIS — Z0000 Encounter for general adult medical examination without abnormal findings: Secondary | ICD-10-CM

## 2023-11-11 NOTE — Patient Instructions (Signed)
Lee Barnes , Thank you for taking time to come for your Medicare Wellness Visit. I appreciate your ongoing commitment to your health goals. Please review the following plan we discussed and let me know if I can assist you in the future.   Referrals/Orders/Follow-Ups/Clinician Recommendations: none  This is a list of the screening recommended for you and due dates:  Health Maintenance  Topic Date Due   Zoster (Shingles) Vaccine (1 of 2) 11/11/1970   Pneumonia Vaccine (2 of 2 - PPSV23 or PCV20) 11/19/2018   Colon Cancer Screening  01/21/2024   COVID-19 Vaccine (5 - 2024-25 season) 11/27/2023*   DTaP/Tdap/Td vaccine (2 - Td or Tdap) 10/06/2024   Medicare Annual Wellness Visit  11/10/2024   Flu Shot  Completed   Hepatitis C Screening  Completed   HPV Vaccine  Aged Out  *Topic was postponed. The date shown is not the original due date.    Advanced directives: (Copy Requested) Please bring a copy of your health care power of attorney and living will to the office to be added to your chart at your convenience.  Next Medicare Annual Wellness Visit scheduled for next year: Yes  insert Preventive Care attachment Insert FALL PREVENTION attachment if needed

## 2023-11-11 NOTE — Progress Notes (Signed)
Subjective:   Lee Barnes is a 72 y.o. male who presents for Medicare Annual/Subsequent preventive examination.  Visit Complete: Virtual I connected with  Lee Barnes on 11/11/23 by a audio enabled telemedicine application and verified that I am speaking with the correct person using two identifiers.  Patient Location: Home  Provider Location: Office/Clinic  I discussed the limitations of evaluation and management by telemedicine. The patient expressed understanding and agreed to proceed.  Vital Signs: Because this visit was a virtual/telehealth visit, some criteria may be missing or patient reported. Any vitals not documented were not able to be obtained and vitals that have been documented are patient reported.    Cardiac Risk Factors include: advanced age (>32men, >104 women);hypertension;male gender     Objective:    Today's Vitals   There is no height or weight on file to calculate BMI.     11/11/2023   10:24 AM 01/28/2023   10:01 PM 12/27/2022    9:44 AM 12/11/2022    8:28 AM 11/13/2022    1:00 PM 11/12/2022    9:26 PM 11/01/2022   11:32 AM  Advanced Directives  Does Patient Have a Medical Advance Directive? Yes No No No Yes Yes Yes  Type of Estate agent of [place];Living will    Healthcare Power of [place];Living will Healthcare Power of [place];Living will Healthcare Power of [place];Living will  Does patient want to make changes to medical advance directive?     No - Patient declined    Copy of Healthcare Power of Attorney in Chart? No - copy requested    No - copy requested  No - copy requested  Would patient like information on creating a medical advance directive?  No - Patient declined No - Patient declined No - Patient declined       Current Medications (verified) Outpatient Encounter Medications as of 11/11/2023  Medication Sig   albuterol (PROVENTIL) (2.5 MG/3ML) 0.083% nebulizer solution Inhale 3 mLs (2.5 mg total) by  nebulization every 4 (four) hours as needed for wheezing.   cetirizine (ZYRTEC) 10 MG chewable tablet Place 10 mg into feeding tube daily as needed for allergies or rhinitis.   diphenhydrAMINE (BENADRYL) 25 MG tablet Take 12.5 mg by mouth at bedtime as needed for allergies (to dry secretions).   fluticasone (FLONASE) 50 MCG/ACT nasal spray SPRAY TWO SPRAYS IN EACH NOSTRIL ONCE DAILY   guaiFENesin (ROBITUSSIN) 100 MG/5ML liquid Place 30 mLs into feeding tube 2 (two) times daily.   midodrine (PROAMATINE) 5 MG tablet Take 5 mg by mouth 3 (three) times daily with meals as needed (low BP).   Nutritional Supplements (FEEDING SUPPLEMENT, OSMOLITE 1.5 CAL,) LIQD Place 1,425 mLs into feeding tube daily.   ondansetron (ZOFRAN) 4 MG/5ML solution Place 4 mg into feeding tube every 8 (eight) hours as needed for nausea or vomiting.   sodium chloride (OCEAN) 0.65 % SOLN nasal spray Place 1 spray into both nostrils as needed for congestion.   sodium chloride HYPERTONIC 3 % nebulizer solution Take 4 mLs by nebulization daily.   acetaminophen (TYLENOL) 325 MG tablet Place 2 tablets (650 mg total) into feeding tube every 6 (six) hours as needed for mild pain (or Fever >/= 101). (Patient not taking: Reported on 11/11/2023)   esomeprazole (NEXIUM) 40 MG capsule Open capsule, pour granules in a 60ml syringe & mix with 50 ml of water, shake vigorously & give immediately via feeding tube.  The tube also needs to be flushed after  with water (Patient not taking: Reported on 11/11/2023)   ferrous sulfate 300 (60 Fe) MG/5ML syrup Take 5 mLs (300 mg total) by mouth daily.   hydrALAZINE (APRESOLINE) 25 MG tablet Place 1 tablet (25 mg total) into feeding tube every 8 (eight) hours as needed (for SBP greater than 175). (Patient not taking: Reported on 11/11/2023)   scopolamine (TRANSDERM-SCOP) 1 MG/3DAYS Place 1 patch (1.5 mg total) onto the skin every 3 (three) days. (Patient not taking: Reported on 11/11/2023)   No  facility-administered encounter medications on file as of 11/11/2023.    Allergies (verified) Codeine   History: Past Medical History:  Diagnosis Date   Allergy to environmental factors    Basal cell carcinoma of left cheek 07/17/2022   Complication of anesthesia    "vageled" after surgery -overnite stay   Diverticulosis 2008   Heart murmur    hx of in childhood    History of chemotherapy    History of radiation therapy 11/05/10-12/26/10   r base tongue, 7000 cGy 35 sessions   Hypertension    Medication started in May 2016.   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   Tinnitus    Past Surgical History:  Procedure Laterality Date   APPENDECTOMY     COLONOSCOPY     INGUINAL HERNIA REPAIR Bilateral 03/30/2015   Procedure: LAPAROSCOPIC BILATERAL INGUINAL HERNIA REPAIR WITH MESH;  Surgeon: Abigail Miyamoto, MD;  Location: [facility];  Service: General;  Laterality: Bilateral;   INGUINAL HERNIA REPAIR Right 11/18/1958   INGUINAL HERNIA REPAIR Bilateral 03/30/2015   INGUINAL HERNIA REPAIR Left 07/11/2015   INGUINAL HERNIA REPAIR N/A 07/11/2015   Procedure: REPAIR OF LEFT INGUINAL HERNIA WITH MESH;  Surgeon: Abigail Miyamoto, MD;  Location: [facility];  Service: General;  Laterality: N/A;   INSERTION OF MESH N/A 07/11/2015   Procedure: INSERTION OF MESH;  Surgeon: Abigail Miyamoto, MD;  Location: [facility];  Service: General;  Laterality: N/A;   IR REPLC GASTRO/COLONIC TUBE PERCUT W/FLUORO  12/03/2021   IR REPLC GASTRO/COLONIC TUBE PERCUT W/FLUORO  03/19/2023   IR REPLC GASTRO/COLONIC TUBE PERCUT W/FLUORO  11/10/2023   LUMBAR DISC SURGERY     PEG PLACEMENT N/A 03/22/2022   Procedure: PERCUTANEOUS ENDOSCOPIC GASTROSTOMY (PEG) REPLACEMENT;  Surgeon: Jeani Hawking, MD;  Location: [facility];  Service: Gastroenterology;  Laterality: N/A;   SAVORY DILATION N/A 03/22/2022   Procedure: SAVORY DILATION;  Surgeon: Jeani Hawking, MD;   Location: [facility];  Service: Gastroenterology;  Laterality: N/A;   SKIN BIOPSY Left 06/24/2022   basal cell carcinoma face cheek   TONSILLECTOMY     Family History  Problem Relation Age of Onset   Heart disease Father    Cancer Father        throat ca   Heart disease Mother    Colon cancer Neg Hx    Social History   Socioeconomic History   Marital status: Married    Spouse name: Not on file   Number of children: 3   Years of education: Not on file   Highest education level: Not on file  Occupational History   Occupation: OWNER    Employer: [employer]  Tobacco Use   Smoking status: Never   Smokeless tobacco: Never  Vaping Use   Vaping status: Never Used  Substance and Sexual Activity   Alcohol use: Not Currently    Comment: occas drinks  a beer every few weeks    Drug use: No    Comment:     Sexual activity: Yes  Other Topics Concern   Not on file  Social History Narrative   Not on file   Social Drivers of Health   Financial Resource Strain: Low Risk  (11/11/2023)   Overall Financial Resource Strain (CARDIA)    Difficulty of Paying Living Expenses: Not hard at all  Food Insecurity: No Food Insecurity (11/11/2023)   Hunger Vital Sign    Worried About Running Out of Food in the Last Year: Never true    Ran Out of Food in the Last Year: Never true  Transportation Needs: No Transportation Needs (11/11/2023)   PRAPARE - Administrator, Civil Service (Medical): No    Lack of Transportation (Non-Medical): No  Physical Activity: Inactive (11/11/2023)   Exercise Vital Sign    Days of Exercise per Week: 0 days    Minutes of Exercise per Session: 0 min  Stress: No Stress Concern Present (11/11/2023)   [employer] of Occupational Health - Occupational Stress Questionnaire    Feeling of Stress : Not at all  Social Connections: Unknown (11/11/2023)   Social Connection and Isolation Panel [NHANES]    Frequency of Communication with Friends and  Family: Twice a week    Frequency of Social Gatherings with Friends and Family: Not on file    Attends Religious Services: Never    Database administrator or Organizations: No    Attends Engineer, structural: Never    Marital Status: Married    Tobacco Counseling Counseling given: Not Answered   Clinical Intake:  Pre-visit preparation completed: Yes  Pain : No/denies pain     Nutritional Risks: Nausea/ vomitting/ diarrhea (diarrhea a little) Diabetes: No  How often do you need to have someone help you when you read instructions, pamphlets, or other written materials from your doctor or pharmacy?: 1 - Never  Interpreter Needed?: No  Information entered by :: NAllen LPN   Activities of Daily Living    11/11/2023   10:17 AM 01/29/2023    4:57 AM  In your present state of health, do you have any difficulty performing the following activities:  Hearing? 0 0  Vision? 0 0  Difficulty concentrating or making decisions? 0 0  Walking or climbing stairs? 0 0  Dressing or bathing? 0 0  Doing errands, shopping? 0 0  Preparing Food and eating ? N   Using the Toilet? N   In the past six months, have you accidently leaked urine? N   Do you have problems with loss of bowel control? N   Managing your Medications? N   Managing your Finances? N   Housekeeping or managing your Housekeeping? N     Patient Care Team: Ronnald Nian, MD as PCP - General (Family Medicine) Drema Halon, MD (Inactive) Chipper Herb, MD (Inactive) as Attending Physician (Radiation Oncology) Artis Delay, MD as Consulting Physician (Hematology and Oncology) Clarene Duke Karma Lew, RN as VBCI Care Management Bridgett Larsson, LCSW as Social Worker (Licensed Clinical Social Worker) [place], Milas Kocher, [facility] (Pharmacist)  Indicate any recent Medical Services you may have received from other than [facility] providers in the past year (date may be approximate).     Assessment:   This is a routine  wellness examination for [facility].  Hearing/Vision screen Hearing Screening - Comments:: Denies hearing issues Vision Screening - Comments:: Regular eye exams,  Goals Addressed             This Visit's Progress    Patient Stated       11/11/2023, wants to make sure doing a minimum of 1000 steps daily, get stronger       Depression Screen    11/11/2023   10:26 AM 11/01/2022   11:33 AM 04/10/2022    9:27 AM 04/02/2021    8:32 AM 03/31/2020    8:46 AM 03/30/2019    8:53 AM 11/19/2017    9:02 AM  PHQ 2/9 Scores  PHQ - 2 Score 0 0 0 0 0 0 0  PHQ- 9 Score  0         Fall Risk    11/11/2023   10:25 AM 11/01/2022   11:33 AM 04/10/2022    9:26 AM 04/02/2021    8:32 AM 03/31/2020    8:46 AM  Fall Risk   Falls in the past year? 0 0 0 0 0  Number falls in past yr: 0 0 0 0   Injury with Fall? 0 0 0 0   Risk for fall due to : Medication side effect Medication side effect No Fall Risks No Fall Risks   Follow up Falls prevention discussed;Falls evaluation completed Falls prevention discussed;Education provided;Falls evaluation completed Falls evaluation completed Falls evaluation completed     MEDICARE RISK AT HOME: Medicare Risk at Home Any stairs in or around the home?: Yes If so, are there any without handrails?: No Home free of loose throw rugs in walkways, pet beds, electrical cords, etc?: Yes Adequate lighting in your home to reduce risk of falls?: Yes Life alert?: No Use of a cane, walker or w/c?: No Grab bars in the bathroom?: No Shower chair or bench in shower?: Yes Elevated toilet seat or a handicapped toilet?: Yes  TIMED UP AND GO:  Was the test performed?  No    Cognitive Function:        11/11/2023   10:26 AM 11/01/2022   11:36 AM 11/19/2017    9:06 AM  6CIT Screen  What Year? 0 points 0 points 0 points  What month? 0 points 0 points 0 points  What time? 0 points 0 points 0 points  Count back from 20 0 points 0 points 0 points  Months in reverse 0  points 0 points 0 points  Repeat phrase 0 points 0 points 0 points  Total Score 0 points 0 points 0 points    Immunizations Immunization History  Administered Date(s) Administered   Fluad Quad(high Dose 65+) 09/25/2021, 10/23/2022   Fluad Trivalent(High Dose 65+) 09/11/2023   Influenza Split 10/07/2011, 09/23/2012   Influenza, High Dose Seasonal PF 10/13/2017, 09/22/2018   Influenza, Quadrivalent, Recombinant, Inj, Pf 08/23/2019   Influenza-Unspecified 09/19/2019   Moderna Covid-19 Vaccine Bivalent Booster 46yrs & up 09/25/2021   Moderna SARS-COV2 Booster Vaccination 02/28/2021   Moderna Sars-Covid-2 Vaccination 12/07/2019, 01/04/2020   Pfizer(Comirnaty)Fall Seasonal Vaccine 12 years and older 10/23/2022   Pneumococcal Conjugate-13 11/19/2017   Pneumococcal-Unspecified 08/18/2014   Tdap 10/06/2014   Zoster, Live 12/14/2013    TDAP status: Up to date  Flu Vaccine status: Up to date  Pneumococcal vaccine status: Due, Education has been provided regarding the importance of this vaccine. Advised may receive this vaccine at local pharmacy or Health Dept. Aware to provide a copy of the vaccination record if obtained from local pharmacy or Health Dept. Verbalized acceptance and understanding.  Covid-19 vaccine status: Information provided on  how to obtain vaccines.   Qualifies for Shingles Vaccine? Yes   Zostavax completed Yes   Shingrix Completed?: No.    Education has been provided regarding the importance of this vaccine. Patient has been advised to call insurance company to determine out of pocket expense if they have not yet received this vaccine. Advised may also receive vaccine at local pharmacy or Health Dept. Verbalized acceptance and understanding.  Screening Tests Health Maintenance  Topic Date Due   Zoster Vaccines- Shingrix (1 of 2) 11/11/1970   Pneumonia Vaccine 60+ Years old (2 of 2 - PPSV23 or PCV20) 11/19/2018   Colonoscopy  01/21/2024   COVID-19 Vaccine (5 -  2024-25 season) 11/27/2023 (Originally 07/20/2023)   DTaP/Tdap/Td (2 - Td or Tdap) 10/06/2024   Medicare Annual Wellness (AWV)  11/10/2024   INFLUENZA VACCINE  Completed   Hepatitis C Screening  Completed   HPV VACCINES  Aged Out    Health Maintenance  Health Maintenance Due  Topic Date Due   Zoster Vaccines- Shingrix (1 of 2) 11/11/1970   Pneumonia Vaccine 62+ Years old (2 of 2 - PPSV23 or PCV20) 11/19/2018   Colonoscopy  01/21/2024    Colorectal cancer screening: Type of screening: Colonoscopy. Completed 01/20/2014. Repeat every 10 years  Lung Cancer Screening: (Low Dose CT Chest recommended if Age 62-80 years, 20 pack-year currently smoking OR have quit w/in 15years.) does not qualify.   Lung Cancer Screening Referral: no  Additional Screening:  Hepatitis C Screening: does qualify; Completed 02/05/2016  Vision Screening: Recommended annual ophthalmology exams for early detection of glaucoma and other disorders of the eye. Is the patient up to date with their annual eye exam?  Yes  Who is the provider or what is the name of the office in which the patient attends annual eye exams? Going to new doctor If pt is not established with a provider, would they like to be referred to a provider to establish care? No .   Dental Screening: Recommended annual dental exams for proper oral hygiene  Diabetic Foot Exam: n/a  Community Resource Referral / Chronic Care Management: CRR required this visit?  No   CCM required this visit?  No     Plan:     I have personally reviewed and noted the following in the patient's chart:   Medical and social history Use of alcohol, tobacco or illicit drugs  Current medications and supplements including opioid prescriptions. Patient is not currently taking opioid prescriptions. Functional ability and status Nutritional status Physical activity Advanced directives List of other physicians Hospitalizations, surgeries, and ER visits in previous  12 months Vitals Screenings to include cognitive, depression, and falls Referrals and appointments  In addition, I have reviewed and discussed with patient certain preventive protocols, quality metrics, and best practice recommendations. A written personalized care plan for preventive services as well as general preventive health recommendations were provided to patient.     Barb Merino, LPN   16/08/9603   After Visit Summary: (MyChart) Due to this being a telephonic visit, the after visit summary with patients personalized plan was offered to patient via MyChart   Nurse Notes: none

## 2023-11-26 DIAGNOSIS — H26493 Other secondary cataract, bilateral: Secondary | ICD-10-CM | POA: Diagnosis not present

## 2023-11-26 DIAGNOSIS — Z961 Presence of intraocular lens: Secondary | ICD-10-CM | POA: Diagnosis not present

## 2023-12-03 ENCOUNTER — Ambulatory Visit: Payer: Self-pay | Admitting: Licensed Clinical Social Worker

## 2023-12-08 NOTE — Patient Outreach (Signed)
  Care Coordination   Follow Up Visit Note   12/03/2023 Name: Lee Barnes MRN: [id number] DOB: 1951-06-18  Lee Barnes is a 73 y.o. year old male who sees Ronnald Nian, MD for primary care. I spoke with  Lee Barnes's spouse by phone today.  What matters to the patients health and wellness today?  DME needs    Goals Addressed             This Visit's Progress    Obtain Supportive Resources-Caregiver Fatigue   On track    Activities and task to complete in order to accomplish goals.   Keep all upcoming appointments discussed today Implement healthy coping skills discussed to assist with management of symptoms Continue working with [facility] care team to assist with goals identified Follow up with [facility] for pt supplies. Contact PCP office if a new prescription is required          SDOH assessments and interventions completed:  No     Care Coordination Interventions:  Yes, provided  Interventions Today    Flowsheet Row Most Recent Value  General Interventions   General Interventions Discussed/Reviewed General Interventions Reviewed, Doctor Visits, Durable Medical Equipment (DME)  Doctor Visits Discussed/Reviewed Doctor Visits Reviewed  Durable Medical Equipment (DME) Other  [Spouse is calling [facility] to get more suction supplies and breathing tx supplies. Will call us if PCP needs to make another prescription.]  Mental Health Interventions   Mental Health Discussed/Reviewed Mental Health Reviewed  [No social work needs at this time. LCSW encouraged self care]  Nutrition Interventions   Nutrition Discussed/Reviewed Nutrition Reviewed  Pharmacy Interventions   Pharmacy Dicussed/Reviewed Pharmacy Topics Reviewed, Medication Adherence       Follow up plan: Follow up call scheduled for 6-8 weeks    Encounter Outcome:  Patient Visit Completed   Jenel Lucks, LCSW [place]  [facility], [facility] Clinical Social  Worker Direct Dial: [phone]  Fax: [phone] Website: [email or web address] 4:52 AM

## 2023-12-08 NOTE — Patient Instructions (Signed)
Visit Information  Thank you for taking time to visit with me today. Please don't hesitate to contact me if I can be of assistance to you before our next scheduled telephone appointment.  Following are the goals we discussed today:  (Copy and paste patient goals from clinical care plan here)  Our next appointment is by telephone on 03/5 at 10 AM  Please call the care guide team at [phone] if you need to cancel or reschedule your appointment.   If you are experiencing a Mental Health or Behavioral Health Crisis or need someone to talk to, please call the Suicide and Crisis Lifeline: 988 call 911   Patient verbalizes understanding of instructions and care plan provided today and agrees to view in MyChart. Active MyChart status and patient understanding of how to access instructions and care plan via MyChart confirmed with patient.     Windy Fast [facility] Health  [facility], [facility] Clinical Social Worker Direct Dial: [phone]  Fax: [phone] Website: [email or web address] 4:53 AM

## 2023-12-11 ENCOUNTER — Encounter: Payer: Self-pay | Admitting: Family Medicine

## 2023-12-29 ENCOUNTER — Telehealth: Payer: Self-pay | Admitting: *Deleted

## 2023-12-29 NOTE — Progress Notes (Signed)
 Complex Care Management Care Guide Note  12/29/2023 Name: Lee Barnes MRN: [id number] DOB: [birth year]  [name] is a 73 y.o. year old male who is a primary care patient of [name], [name], MD and is actively engaged with the care management team. I reached out to Ami Balboa by phone today to assist with re-scheduling  with the RN Case Manager.  Follow up plan: Telephone appointment with complex care management team member scheduled for:  01/20/24  Barnie Bora  [facility] Health  [facility], [facility] Guide  Direct Dial: [phone]  Fax [phone]

## 2023-12-29 NOTE — Progress Notes (Signed)
 Complex Care Management Care Guide Note  12/29/2023 Name: [name] MRN: [id number] DOB: February 03, 1951  Lee Barnes is a 73 y.o. year old male who is a primary care patient of [name], [name], MD and is actively engaged with the care management team. I reached out to Ami Balboa by phone today to assist with re-scheduling  with the RN Case Manager.  Follow up plan: Unsuccessful telephone outreach attempt made. A HIPAA compliant phone message was left for the patient providing contact information and requesting a return call.  Barnie Bora  [facility]  [facility], [facility] Guide  Direct Dial: [phone]  Fax [phone]

## 2024-01-13 ENCOUNTER — Encounter: Payer: Self-pay | Admitting: Internal Medicine

## 2024-01-13 ENCOUNTER — Encounter: Payer: Self-pay | Admitting: Family Medicine

## 2024-01-13 DIAGNOSIS — I951 Orthostatic hypotension: Secondary | ICD-10-CM | POA: Diagnosis not present

## 2024-01-13 DIAGNOSIS — I10 Essential (primary) hypertension: Secondary | ICD-10-CM | POA: Diagnosis not present

## 2024-01-20 ENCOUNTER — Ambulatory Visit: Payer: Medicare Other

## 2024-01-20 NOTE — Patient Outreach (Signed)
 Care Coordination   01/20/2024 Name: Lee Barnes MRN: [id number] DOB: 07/28/1951   Care Coordination Outreach Attempts:  An unsuccessful outreach was attempted for an appointment today.  Follow Up Plan:  Additional outreach attempts will be made to offer the patient complex care management information and services.   Encounter Outcome:  No Answer   Care Coordination Interventions:  No, not indicated    Delsa Sale RN BSN CCM [place]  [facility], [facility] Health Nurse Care Coordinator  Direct Dial: [phone] Website: [email or web address]

## 2024-01-21 ENCOUNTER — Ambulatory Visit: Payer: Self-pay | Admitting: Licensed Clinical Social Worker

## 2024-01-22 NOTE — Patient Instructions (Signed)
 Visit Information  Thank you for taking time to visit with me today. Please don't hesitate to contact me if I can be of assistance to you.   Following are the goals we discussed today:   Goals Addressed             This Visit's Progress    COMPLETED: Obtain Supportive Resources-Caregiver Fatigue   On track    Activities and task to complete in order to accomplish goals.   Keep all upcoming appointments discussed today Implement healthy coping skills discussed to assist with management of symptoms Continue working with [facility] care team to assist with goals identified           Please call the care guide team at [phone] if you need to cancel or reschedule your appointment.   If you are experiencing a Mental Health or Behavioral Health Crisis or need someone to talk to, please call the Suicide and Crisis Lifeline: 988 call 911   Patient verbalizes understanding of instructions and care plan provided today and agrees to view in MyChart. Active MyChart status and patient understanding of how to access instructions and care plan via MyChart confirmed with patient.     No further follow up required: Spouse agreed to contact PCP if new needs arise  Jenel Lucks, LCSW [facility] Health  [facility], [facility] Clinical Social Worker Direct Dial: [phone]  Fax: [phone] Website: [email or web address] 10:44 AM

## 2024-01-22 NOTE — Patient Outreach (Signed)
 Care Coordination   Follow Up Visit Note   01/21/2024 Name: Lee Barnes MRN: [id number] DOB: 05-29-1951  LADAINIAN THERIEN is a 73 y.o. year old male who sees Ronnald Nian, MD for primary care. I spoke with  Prentice Docker Beaumier's spouse by phone today.  What matters to the patients health and wellness today?  Symptom Management/Caregiver Stress    Goals Addressed             This Visit's Progress    COMPLETED: Obtain Supportive Resources-Caregiver Fatigue   On track    Activities and task to complete in order to accomplish goals.   Keep all upcoming appointments discussed today Implement healthy coping skills discussed to assist with management of symptoms Continue working with [facility] care team to assist with goals identified           SDOH assessments and interventions completed:  No     Care Coordination Interventions:  Yes, provided  Interventions Today    Flowsheet Row Most Recent Value  Chronic Disease   Chronic disease during today's visit Other  [Caregiver Stress]  General Interventions   General Interventions Discussed/Reviewed General Interventions Reviewed, Doctor Visits  [Pt's spouse endorsed recent stress in caring for elderly parents. Spouse reports pt is doing well and they are managing chronic health conditions]  Doctor Visits Discussed/Reviewed Doctor Visits Reviewed, Specialist  [Patieint has an appt with dentist today. Visited Cardiologist, appt went well and they will f/up in a year]  Mental Health Interventions   Mental Health Discussed/Reviewed Mental Health Reviewed, Coping Strategies  [Family are implementing self care. Planning a couple of summer vacations to assist with stress management and promote positive mood]  Nutrition Interventions   Nutrition Discussed/Reviewed Nutrition Reviewed  Pharmacy Interventions   Pharmacy Dicussed/Reviewed Pharmacy Topics Reviewed, Medication Adherence  Safety Interventions   Safety Discussed/Reviewed Safety  Reviewed       Follow up plan: No further intervention required.   Encounter Outcome:  Patient Visit Completed   Jenel Lucks, LCSW [facility]  [facility], [facility] Clinical Social Worker Direct Dial: [phone]  Fax: [phone] Website: [email or web address] 10:43 AM

## 2024-01-27 ENCOUNTER — Ambulatory Visit: Admitting: Family Medicine

## 2024-01-27 ENCOUNTER — Encounter: Payer: Self-pay | Admitting: Family Medicine

## 2024-01-27 VITALS — BP 160/80 | HR 83 | Ht 72.0 in | Wt 165.6 lb

## 2024-01-27 DIAGNOSIS — L609 Nail disorder, unspecified: Secondary | ICD-10-CM

## 2024-01-27 DIAGNOSIS — M7501 Adhesive capsulitis of right shoulder: Secondary | ICD-10-CM

## 2024-01-27 DIAGNOSIS — Z1211 Encounter for screening for malignant neoplasm of colon: Secondary | ICD-10-CM | POA: Diagnosis not present

## 2024-01-27 DIAGNOSIS — Z23 Encounter for immunization: Secondary | ICD-10-CM

## 2024-01-27 DIAGNOSIS — Z8739 Personal history of other diseases of the musculoskeletal system and connective tissue: Secondary | ICD-10-CM

## 2024-01-27 NOTE — Progress Notes (Signed)
   Subjective:    Patient ID: Lee Barnes, male    DOB: Feb 16, 1951, 73 y.o.   MRN: [id number]  HPI He is here for consult concerning difficulty with right shoulder pain.  He has a previous history of shoulder dislocation and subsequent surgery for that several decades in the past.  He has now noted increased difficulty with decreased range of motion as well as some weakness in that arm.  He has been seen at [facility] orthopedics.  He also complains of an abnormality of the right ring finger nail.  He would like that looked at.   Review of Systems     Objective:    Physical Exam Right shoulder exam does show some decreased range of motion especially with abduction and external rotation.  Negative sulcus sign.  No tenderness over the bicipital groove or A/C joint.  Drop arm test was uncomfortable. He does have an abnormality of the right ring finger nail  Immunizations were reviewed.    Assessment & Plan:  Adhesive capsulitis of right shoulder - Plan: Ambulatory referral to Orthopedic Surgery  Need for vaccination against Streptococcus pneumoniae - Plan: Pneumococcal conjugate vaccine 20-valent (Prevnar 20)  Nail lesion - Plan: Ambulatory referral to Dermatology  History of dislocation of shoulder - Plan: Ambulatory referral to Orthopedic Surgery  Screening for colon cancer - Plan: Cologuard Recommend that he get RSV, Tdap and shingles vaccine at the drugstore.

## 2024-02-09 DIAGNOSIS — Z85828 Personal history of other malignant neoplasm of skin: Secondary | ICD-10-CM | POA: Diagnosis not present

## 2024-02-09 DIAGNOSIS — B351 Tinea unguium: Secondary | ICD-10-CM | POA: Diagnosis not present

## 2024-02-11 ENCOUNTER — Ambulatory Visit: Admitting: Family Medicine

## 2024-02-17 DIAGNOSIS — Z1211 Encounter for screening for malignant neoplasm of colon: Secondary | ICD-10-CM | POA: Diagnosis not present

## 2024-02-19 DIAGNOSIS — M25511 Pain in right shoulder: Secondary | ICD-10-CM | POA: Diagnosis not present

## 2024-02-23 ENCOUNTER — Encounter: Payer: Self-pay | Admitting: Family Medicine

## 2024-02-23 LAB — COLOGUARD: COLOGUARD: NEGATIVE

## 2024-03-05 ENCOUNTER — Ambulatory Visit (HOSPITAL_COMMUNITY)
Admission: RE | Admit: 2024-03-05 | Discharge: 2024-03-05 | Disposition: A | Discharge status: Home / Self Care | Source: Ambulatory Visit | Attending: Interventional Radiology | Admitting: Interventional Radiology

## 2024-03-05 ENCOUNTER — Other Ambulatory Visit (HOSPITAL_COMMUNITY): Payer: Self-pay | Admitting: Interventional Radiology

## 2024-03-05 DIAGNOSIS — Z431 Encounter for attention to gastrostomy: Secondary | ICD-10-CM | POA: Diagnosis not present

## 2024-03-05 DIAGNOSIS — Z931 Gastrostomy status: Secondary | ICD-10-CM

## 2024-03-05 DIAGNOSIS — K9419 Other complications of enterostomy: Secondary | ICD-10-CM | POA: Diagnosis not present

## 2024-03-05 HISTORY — PX: IR REPLC GASTRO/COLONIC TUBE PERCUT W/FLUORO: IMG2333

## 2024-03-05 MED ORDER — LIDOCAINE VISCOUS HCL 2 % MT SOLN
15.0000 mL | Freq: Once | OROMUCOSAL | Status: AC
  Administered 2024-03-05: 2 mL via OROMUCOSAL

## 2024-03-05 MED ORDER — IOHEXOL 300 MG/ML  SOLN
50.0000 mL | Freq: Once | INTRAMUSCULAR | Status: AC | PRN
  Administered 2024-03-05: 20 mL

## 2024-03-05 MED ORDER — LIDOCAINE VISCOUS HCL 2 % MT SOLN
OROMUCOSAL | Status: AC
  Filled 2024-03-05: qty 15

## 2024-03-10 DIAGNOSIS — G5603 Carpal tunnel syndrome, bilateral upper limbs: Secondary | ICD-10-CM | POA: Diagnosis not present

## 2024-03-10 DIAGNOSIS — G629 Polyneuropathy, unspecified: Secondary | ICD-10-CM | POA: Diagnosis not present

## 2024-03-16 DIAGNOSIS — M25511 Pain in right shoulder: Secondary | ICD-10-CM | POA: Diagnosis not present

## 2024-03-24 ENCOUNTER — Telehealth

## 2024-03-31 DIAGNOSIS — R633 Feeding difficulties, unspecified: Secondary | ICD-10-CM | POA: Diagnosis not present

## 2024-04-01 ENCOUNTER — Other Ambulatory Visit: Payer: Self-pay | Admitting: Family Medicine

## 2024-04-01 DIAGNOSIS — Z8701 Personal history of pneumonia (recurrent): Secondary | ICD-10-CM

## 2024-04-01 DIAGNOSIS — Z85819 Personal history of malignant neoplasm of unspecified site of lip, oral cavity, and pharynx: Secondary | ICD-10-CM

## 2024-04-08 ENCOUNTER — Encounter: Payer: Self-pay | Admitting: Family Medicine

## 2024-04-09 ENCOUNTER — Ambulatory Visit: Payer: Self-pay

## 2024-04-09 ENCOUNTER — Other Ambulatory Visit: Payer: Self-pay

## 2024-04-09 ENCOUNTER — Ambulatory Visit (INDEPENDENT_AMBULATORY_CARE_PROVIDER_SITE_OTHER): Admitting: Nurse Practitioner

## 2024-04-09 ENCOUNTER — Encounter: Payer: Self-pay | Admitting: Nurse Practitioner

## 2024-04-09 ENCOUNTER — Ambulatory Visit
Admission: RE | Admit: 2024-04-09 | Discharge: 2024-04-09 | Disposition: A | Discharge status: Home / Self Care | Source: Ambulatory Visit | Attending: Nurse Practitioner | Admitting: Nurse Practitioner

## 2024-04-09 VITALS — BP 110/88 | HR 86 | Wt 164.0 lb

## 2024-04-09 DIAGNOSIS — R0989 Other specified symptoms and signs involving the circulatory and respiratory systems: Secondary | ICD-10-CM | POA: Diagnosis not present

## 2024-04-09 DIAGNOSIS — Z931 Gastrostomy status: Secondary | ICD-10-CM

## 2024-04-09 DIAGNOSIS — K219 Gastro-esophageal reflux disease without esophagitis: Secondary | ICD-10-CM

## 2024-04-09 DIAGNOSIS — R1312 Dysphagia, oropharyngeal phase: Secondary | ICD-10-CM | POA: Diagnosis not present

## 2024-04-09 DIAGNOSIS — J986 Disorders of diaphragm: Secondary | ICD-10-CM | POA: Diagnosis not present

## 2024-04-09 DIAGNOSIS — R09A2 Foreign body sensation, throat: Secondary | ICD-10-CM | POA: Diagnosis not present

## 2024-04-09 MED ORDER — PANTOPRAZOLE SODIUM 40 MG PO PACK
40.0000 mg | PACK | Freq: Every day | ORAL | 3 refills | Status: AC
Start: 2024-04-09 — End: ?

## 2024-04-09 MED ORDER — GUAIFENESIN 100 MG/5ML PO LIQD: 30.0000 mL | Freq: Two times a day (BID) | ORAL | 0 refills | Status: DC

## 2024-04-09 MED ORDER — NYSTATIN 100000 UNIT/ML MT SUSP: 5.0000 mL | Freq: Four times a day (QID) | OROMUCOSAL | 0 refills | Status: DC

## 2024-04-09 MED ORDER — FLUCONAZOLE 150 MG PO TABS: ORAL_TABLET | ORAL | 2 refills | Status: DC

## 2024-04-09 NOTE — Telephone Encounter (Signed)
 Pt has an appt today.

## 2024-04-09 NOTE — Patient Outreach (Signed)
 Complex Care Management   Visit Note  04/09/2024  Name:  Lee Barnes MRN: [id number] DOB: [birth year]  Situation: Referral received for Complex Care Management related to Hypertension; Neurogenic orthostatic hypotension; dysphagia with tube feeding; throat discomfort. I obtained verbal consent from Caregiver.  Visit completed with [name]  on the phone.  Background:   Past Medical History:  Diagnosis Date   Allergy to environmental factors    Basal cell carcinoma of left cheek 07/17/2022   Complication of anesthesia    "vageled" after surgery -overnite stay   Diverticulosis 2008   Heart murmur    hx of in childhood    History of chemotherapy    History of radiation therapy 11/05/10-12/26/10   r base tongue, 7000 cGy 35 sessions   Hypertension    Medication started in May 2016.   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   Tinnitus     Assessment: Patient Reported Symptoms:  Cognitive Cognitive Status: Alert and oriented to person, place, and time Cognitive/Intellectual Conditions Management [RPT]: None reported or documented in medical history or problem list   Health Maintenance Behaviors: Annual physical exam  Neurological Neurological Review of Symptoms: Weakness (righ arm weakness) Neurological Conditions:  (adhesive capsulitis of right shoulder) Neurological Management Strategies: Routine screening Neurological Self-Management Outcome: 3 (uncertain) Neurological Comment: 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  HEENT HEENT Symptoms Reported: Change or loss of hearing, Other: HEENT Conditions: 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, right voice box paralysis) HEENT Management Strategies: Routine screening HEENT Self-Management Outcome: 3 (uncertain) 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, right voice box paralysis)  Cardiovascular Cardiovascular Symptoms Reported: Other:,  Lightheadness Other Cardiovascular Symptoms: intermittent hypotension secondary to having radiation to his neck Does patient have uncontrolled Hypertension?: Yes Is patient checking Blood Pressure at home?: Yes Cardiovascular Conditions: Hypertension (Neurogenic orthostatic hypotension; Hypotension; Hypertensive urgency) Cardiovascular Management Strategies: Medication therapy, Routine screening Cardiovascular Self-Management Outcome: 4 (good)  Respiratory Respiratory Symptoms Reported: Productive cough Additional Respiratory Details: history of aspirational pneumonia; allergic rhinitis Respiratory Conditions: Cough, Seasonal allergies Respiratory Self-Management Outcome: 4 (good)  Endocrine Patient reports the following symptoms related to hypoglycemia or hyperglycemia : No symptoms reported Is patient diabetic?: No    Gastrointestinal Gastrointestinal Symptoms Reported: Diarrhea Additional Gastrointestinal Details: history of Diverticulosis; Dysphagia; GERD; protein caloric malnutrition Gastrointestinal Conditions: Diarrhea, Reflux/heartburn Gastrointestinal Management Strategies: Nutrition support, Medication therapy (gastrojejunostomy tube; [place]; protein supplement) Enteral Nutrition Time Frame: Continuous Gastrointestinal Self-Management Outcome: 3 (uncertain) Nutrition Risk Screen (CP): Difficulty chewing/swallowing, Tube feeding or parenteral nutrition (history of oropharyngeal cancer; basal cell carcinoma of left cheek)  Genitourinary Genitourinary Symptoms Reported: Frequency Genitourinary Conditions: Frequency Genitourinary Self-Management Outcome: 3 (uncertain)  Integumentary Integumentary Symptoms Reported: No symptoms reported    Musculoskeletal Musculoskelatal Symptoms Reviewed: Weakness Additional Musculoskeletal Details: leg weakness when walking longer distances Musculoskeletal Conditions: Other Other Musculoskeletal Conditions: deconditioning Musculoskeletal  Management Strategies: Adequate rest, Routine screening Musculoskeletal Self-Management Outcome: 3 (uncertain) Falls in the past year?: No Number of falls in past year: 1 or less Was there an injury with Fall?: No Fall Risk Category Calculator: 0 Patient Fall Risk Level: Low Fall Risk Patient at Risk for Falls Due to: Other (Comment) (leg weakness secondary to deconditioning) Fall risk Follow up: Education provided, Falls evaluation completed, Falls prevention discussed  Psychosocial Psychosocial Symptoms Reported: No symptoms reported   Major Change/Loss/Stressor/Fears (CP): Denies Quality of Family  Relationships: involved, helpful, supportive Do you feel physically threatened by others?: No      11/11/2023   10:26 AM  Depression screen PHQ 2/9  Decreased Interest 0  Down, Depressed, Hopeless 0  PHQ - 2 Score 0    There were no vitals filed for this visit.  Medications Reviewed Today     Reviewed by Kaylene Pascal, RN (Registered Nurse) on 04/09/24 at 1329  Med List Status: <None>   Medication Order Taking? Sig Documenting Provider Last Dose Status Informant  acetaminophen  (TYLENOL ) 325 MG tablet [id number] Yes Place 2 tablets (650 mg total) into feeding tube every 6 (six) hours as needed for mild pain (or Fever >/= 101). Barbee Lew, MD Taking Active            Med Note Felipe Horton, Venus Ginsberg   Tue Jan 27, 2024  2:26 PM) As needed  albuterol  (PROVENTIL ) (2.5 MG/3ML) 0.083% nebulizer solution [id number] Yes USE 3 MILLILITERS VIA NEBULIZATION BY MOUTH EVERY 4 HOURS AS NEEDED FOR WHEEZING Watson Hacking, MD Taking Active   cetirizine (ZYRTEC) 10 MG chewable tablet [id number] Yes Place 10 mg into feeding tube daily as needed for allergies or rhinitis. [provider] Taking Active Self, Pharmacy Records  diphenhydrAMINE  (BENADRYL ) 25 MG tablet [id number] Yes Take 12.5 mg by mouth at bedtime as needed for allergies (to dry secretions). [provider] Taking  Active Self, Pharmacy Records           Med Note Felipe Horton, [name]   Tue Jan 27, 2024  2:26 PM) 1/2 tab nightly  esomeprazole  (NEXIUM ) 40 MG capsule [id number] No Open capsule, pour granules in a 60ml syringe & mix with 50 ml of water , shake vigorously & give immediately via feeding tube.  The tube also needs to be flushed after with water   Patient not taking: Reported on 04/09/2024   Watson Hacking, MD Not Taking Active   ferrous sulfate  300 (60 Fe) MG/5ML syrup [id number] Yes Take 5 mLs (300 mg total) by mouth daily. Regalado, Belkys A, MD Taking Active Self, Pharmacy Records           Med Note Sharee Dates Apr 10, 2023  1:33 PM) Crushing a 325mg  tablet daily and giving via tube  fluticasone  (FLONASE ) 50 MCG/ACT nasal spray [id number] Yes SPRAY TWO SPRAYS IN EACH NOSTRIL ONCE DAILY Watson Hacking, MD Taking Active   guaiFENesin  (ROBITUSSIN) 100 MG/5ML liquid [id number] Yes Place 30 mLs into feeding tube 2 (two) times daily. Regalado, Belkys A, MD Taking Active Self, Pharmacy Records           Med Note Felipe Horton, Rod Circle Sep 11, 2023 11:06 AM) 30ml q am and 30ml qpm  hydrALAZINE  (APRESOLINE ) 25 MG tablet [id number] Yes Place 1 tablet (25 mg total) into feeding tube every 8 (eight) hours as needed (for SBP greater than 175). Barbee Lew, MD Taking Active            Med Note Nathen Balder, Skeeter Dukes   Fri Apr 09, 2024  1:28 PM) As needed if BP is elevated   midodrine  (PROAMATINE ) 5 MG tablet [id number] Yes Take 5 mg by mouth 3 (three) times daily with meals as needed (low BP). [provider] Taking Active Self           Med Note Felipe Horton, [name]   Tue Jan 27, 2024  2:28 PM) As needed  Nutritional Supplements (FEEDING SUPPLEMENT, OSMOLITE 1.5  CAL,) LIQD [id number] Yes Place 1,425 mLs into feeding tube daily. Verlyn Goad, MD Taking Active Self, Pharmacy Records           Med Note Raynaldo Call Jan 16, 2023 11:11 AM) And protein supplement  ondansetron   (ZOFRAN ) 4 MG/5ML solution [id number] Yes Place 4 mg into feeding tube every 8 (eight) hours as needed for nausea or vomiting. [provider] Taking Active Self  sodium chloride  (OCEAN) 0.65 % SOLN nasal spray [id number] No Place 1 spray into both nostrils as needed for congestion.  Patient not taking: Reported on 01/27/2024   Danette Duos, MD Not Taking Active Self, Pharmacy Records  sodium chloride  HYPERTONIC 3 % nebulizer solution [id number] Yes Take 4 mLs by nebulization daily. Danette Duos, MD Taking Active Self, Pharmacy Records  Med List Note Georgeana Kindler 06/26/22 8756): Extra fluids at 1000 - Gatorade or Pedialyte            Recommendation:   PCP Follow-up  Follow Up Plan:   Telephone follow up appointment date/time:  05/07/24 at 1:30 PM  Louanne Roussel RN BSN CCM Branch  [facility], [facility] Nurse Care Coordinator  Direct Dial: [phone] Website: [email or web address]

## 2024-04-09 NOTE — Assessment & Plan Note (Signed)
 Chronic dysphagia with sensation of something stuck in the throat, likely exacerbated by prior radiation therapy to the neck. Differential includes thrush, GERD inflammation, and increased mucous production. No known recent aspiration events. Redness in the throat observed with candidal like patches. Potential reflux contribution considered given that he has been unable to take omeprazole . We discussed options for evaluation and management, given that we are heading into the weekend. Given his complex conditions, I do feel that aggressive management is most appropriate. . - Order chest and neck x-ray to evaluate for inflammation or other abnormalities. - Initiate treatment with Mucinex  to help clear mucus. - Initiate treatment for thrush with Diflucan and nystatin  swab. - Prescribe pantoprazole  suspension if covered by insurance. - Consider using Tums as an alternative for reflux management.

## 2024-04-09 NOTE — Telephone Encounter (Signed)
 Pt is scheduled for today

## 2024-04-09 NOTE — Patient Instructions (Addendum)
 [address] for the X-ray.   I have sent in the medications we discussed to the pharmacy

## 2024-04-09 NOTE — Progress Notes (Unsigned)
 Annella Kief, DNP, AGNP-c [facility] [address] [place], [place] [address] [phone]   ACUTE VISIT- ESTABLISHED PATIENT  Blood pressure 110/88, pulse 86, weight 164 lb (74.4 kg), SpO2 96%.  Subjective:  HPI Lee Barnes is a 73 y.o. male presents to day for evaluation of acute concern(s).   History of Present Illness Lee Barnes "Lee Barnes" is a 73 year old male with a history of radiation to the neck who presents with a sensation of something stuck in his throat. He is accompanied by his wife, Lee Barnes.  He experiences a sensation of something being stuck in his throat, which is more pronounced than usual. He has a history of radiation to the neck, resulting in a lack of sensation in the throat area. He is not currently eating or drinking by mouth and uses a feeding tube with a pump due to previous issues with vomiting when using a G tube. He has a GJ tube in place to manage his nutrition and has experienced aspiration pneumonia in the past. 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 His voice is weaker than usual, and he is concerned about the worsening sensation in his throat.  He takes Zyrtec every morning and Benadryl  at night for allergies, which are administered through his feeding tube. No significant increase in allergy symptoms such as runny nose or itchy eyes. He has not experienced a significant cough or recent known aspiration, and his last vomiting episode was approximately two weeks ago. He does express concerns over increased mucous production in the throat with increased difficulty clearing this.   He has a history of reflux but has not been using omeprazole  due to issues with the medication beads getting stuck in the tube. He has been managing without it as he has not been vomiting like before. He sleeps with his head elevated and flushes his G tube with water   regularly.   He has a history of candidal infection of the throat with a similar presentation.  ROS negative except for what is listed in HPI. History, Medications, Surgery, SDOH, and Family History reviewed and updated as appropriate.  Objective:  Physical Exam Vitals and nursing note reviewed.  Constitutional:      General: He is not in acute distress. HENT:     Head: Normocephalic.     Nose: Mucosal edema present.     Mouth/Throat:     Mouth: Mucous membranes are moist.     Pharynx: Uvula midline. Posterior oropharyngeal erythema and postnasal drip present.  Eyes:     Conjunctiva/sclera: Conjunctivae normal.  Cardiovascular:     Rate and Rhythm: Normal rate and regular rhythm.     Pulses: Normal pulses.     Heart sounds: Normal heart sounds.  Pulmonary:     Effort: Pulmonary effort is normal.     Breath sounds: Rhonchi present.  Abdominal:     Palpations: Abdomen is soft.  Musculoskeletal:     Cervical back: Normal range of motion and neck supple. No rigidity or tenderness.  Lymphadenopathy:     Cervical: No cervical adenopathy.  Skin:    General: Skin is warm and dry.     Capillary Refill: Capillary refill takes less than 2 seconds.  Neurological:     General: No focal deficit present.     Mental Status: He is alert and oriented to person, place, and time.  Psychiatric:  Mood and Affect: Mood normal.         Assessment & Plan:   Problem List Items Addressed This Visit     Dysphagia   Chronic dysphagia with sensation of something stuck in the throat, likely exacerbated by prior radiation therapy to the neck. Differential includes thrush, GERD inflammation, and increased mucous production. No known recent aspiration events. Redness in the throat observed with candidal like patches. Potential reflux contribution considered given that he has been unable to take omeprazole . We discussed options for evaluation and management, given that we are heading into the  weekend. Given his complex conditions, I do feel that aggressive management is most appropriate. . - Order chest and neck x-ray to evaluate for inflammation or other abnormalities. - Initiate treatment with Mucinex  to help clear mucus. - Initiate treatment for thrush with Diflucan and nystatin  swab. - Prescribe pantoprazole  suspension if covered by insurance. - Consider using Tums as an alternative for reflux management.      Throat fullness - Primary   Relevant Medications   nystatin  (MYCOSTATIN ) 100000 UNIT/ML suspension   fluconazole (DIFLUCAN) 150 MG tablet   guaiFENesin  (ROBITUSSIN) 100 MG/5ML liquid   Other Relevant Orders   DG Neck Soft Tissue (Completed)   DG Chest 2 View (Completed)   G tube feedings (HCC)   Relevant Medications   pantoprazole  sodium (PROTONIX ) 40 mg   GERD (gastroesophageal reflux disease)   Relevant Medications   pantoprazole  sodium (PROTONIX ) 40 mg   Other Visit Diagnoses       Scattered rhonchi of right lung       Relevant Orders   DG Neck Soft Tissue (Completed)   DG Chest 2 View (Completed)      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  Thrush Possible thrush contributing to sensation of something stuck in the throat. Previous thrush without symptoms. Redness in the throat observed. Treatment for thrush considered as it poses no harm if not present. - Initiate treatment with Diflucan crushed and administered via tube. - Consider nystatin  swab or swish and spit for oral application.  Aspiration pneumonia Recurrent aspiration pneumonia with no recent episodes. No current fever, chills, or significant cough.  Feeding difficulties Feeding difficulties managed with GJ tube due to vomiting with G tube. Trickle feeds to prevent bloating and vomiting. No recent vomiting episodes.  History of radiation therapy Radiation therapy to the neck contributing to  complications including vocal cord paralysis and potential nerve damage.  Follow-up Follow-up plans to ensure appropriate management of symptoms and potential complications. - Follow up with ENT appointment moved to May 30th. - Advise to contact if symptoms do not improve by Tuesday. - Ensure x-ray results are reviewed and contact patient if further action is needed.   Ahriana Gunkel E Massa Pe, DNP, AGNP-c

## 2024-04-09 NOTE — Telephone Encounter (Signed)
  Chief Complaint: hoarseness Symptoms: "something stuck in throat", hoarseness Frequency: x 2 days Pertinent Negatives: Patient denies fever, SOB, CP Disposition: [] ED /[] Urgent Care (no appt availability in office) / [x] Appointment(In office/virtual)/ []  [place] [facility]/ [] Home Care/ [] Refused Recommended Disposition /[] [place] [facility]/ []  Follow-up with PCP Additional Notes: Pt wife, Leighton Punches, calling c/o hoarseness and the feeling of something "stuck in throat". Pt has significant PMH with neck radiation that has made him NPO. Wife reports that pt c/o hoarseness and increased sputum production. She denies any SOB, CP, fever, and has limited visibility of inside mouth. She reports that pt is acting at baseline, just with complaints of above. Of note, pt is already scheduled with earliest appt available at PCP office, but wife insistent on getting earlier appt. Triager called PCP CAL and spoke to [facility]-- she was notified of disposition and patient transferred for scheduling.     Copied from CRM [id number]. Topic: Clinical - Red Word Triage >> Apr 09, 2024  8:13 AM El Gravely T wrote: [facility] that prompted transfer to Nurse Triage: difficulty swallowing, feels like something is stuck in his throat Reason for Disposition  Weak immune system (e.g., HIV positive, cancer chemo, splenectomy, organ transplant, chronic steroids)  Answer Assessment - Initial Assessment Questions 1. DESCRIPTION: "Tell me more about this problem." "Are you  having trouble swallowing liquids, solids, or both?" "Any trouble with swallowing saliva (spit)?"     Pt baseline has feeding tube and is NPO, had lots of radiation to neck d/t cancer treatment, hx of aspiration PNA "Feels like something is stuck in his throat and he's hoarse" Endorses suction and albuterol  INH Unable to look in mouth 2. SEVERITY: "How bad is the swallowing difficulty?"  (e.g., Scale 1-10; or mild, moderate, severe)   - MILD (0-3): Occasional  swallowing difficulty; has trouble swallowing certain types of foods or liquids.   - MODERATE (4-7): Frequent swallowing difficulty; only able to swallow small amounts of foods and fluids.   - SEVERE (8-10): Unable to swallow any foods, fluids, or saliva; sensation of "lump in throat" or "something stuck in throat", and frequent drooling or spitting may be present.     N/a, pt NPO 3. ONSET: "When did the swallowing problems begin?"      Wednesday evening 4. CAUSE: "What do you think is causing the problem?"  (e.g., dry mouth, food or pill stuck in throat, mouth pain, sore throat, progression of disease process such as dementia or Parkinson's disease).      unsure 5. CHRONIC or RECURRENT: "Is this a new problem for you?"  If No, ask: "How long have you had this problem?" (e.g., days, weeks, months)      New  6. OTHER SYMPTOMS: "Do you have any other symptoms?" (e.g., chest pain, difficulty breathing, mouth sores, sore throat, swollen tongue, chest pain)     Denies sx Wife endorses increased sputum production  Protocols used: Swallowing Difficulty-A-AH

## 2024-04-09 NOTE — Patient Instructions (Signed)
 Visit Information  Thank you for taking time to visit with me today. Please don't hesitate to contact me if I can be of assistance to you before our next scheduled appointment.  Our next appointment is by telephone on 05/07/24 at 1:30 PM Please call the care guide team at [phone] if you need to cancel or reschedule your appointment.   Following is a copy of your care plan:   Goals Addressed             This Visit's Progress    COMPLETED: Avoid Nutritional Imbalance       Care Coordination Interventions: Completed successful inbound call with wife [name] Evaluation of current treatment plan related to nutritional imbalance  and patient's adherence to plan as established by provider Determined patient has maintained his weight and is staying nutritionally sound at this time Wife Mollie Anger voices having no concerns related to patient's nutrition at this time Instructed wife to keep patient's doctor informed of new symptoms or concerns  Height 72 in 03/31/2024  Weight 162 lbs 03/31/2024  BMI 21.97 kg/m2 03/31/2024         COMPLETED: No complications from aspirational pnemonia       Care Coordination Interventions: Completed successful call with wife Mollie Anger Evaluation of current treatment plan related to aspirational pneumonia  and patient's adherence to plan as established by provider Determined patient completed a recent visit with Dr. Nickey Barn with no changes made to his treatment plan Discussed patient has not experienced any recent episodes of aspirational pneumonia, patient is receiving Osmolite via continuous feeding pump Discussed with wife Mollie Anger, the possibility of locating a portable feeding pump to allow patient more freedom with travel outside of the house to help improve his socialization and quality of life  Instructed wife to keep patient's doctor well informed of new symptoms or concerns          VBCI RN Care Plan related to Hypertension and Neurogenic orthostatic hypotension    On track    Problems:  Care Coordination needs related to Social Connections Chronic Disease Management support and education needs related to HTN and Neurogenic orthostatic hypotension   Goal: Over the next 90 days the Caregiver will continue to work with Medical illustrator and/or Social Worker to address care management and care coordination needs related to HTN and Neurogenic orthostatic hypotension as evidenced by adherence to care management team scheduled appointments      Interventions:   Hypertension Interventions: Last practice recorded BP readings:  BP Readings from Last 3 Encounters:  04/09/24 110/88  01/27/24 (!) 160/80  11/10/23 139/79   Most recent eGFR/CrCl:  Lab Results  Component Value Date   EGFR 73 09/11/2023    No components found for: "CRCL"  Evaluation of current treatment plan related to hypertension and Neurogenic orthostatic hypotension self management and patient's adherence to plan as established by provider Reviewed medications with patient and discussed importance of compliance Counseled on the importance of exercise goals with target of 150 minutes per week Advised patient, providing education and rationale, to monitor blood pressure daily and record, calling PCP for findings outside established parameters Assessed social determinant of health barriers Discussed plans with patient for ongoing nurse care management follow up and provided patient with direct contact information for nurse case management   Patient Self-Care Activities:  Attend all scheduled provider appointments Call pharmacy for medication refills 3-7 days in advance of running out of medications Call provider office for new concerns or questions  Take  medications as prescribed   check blood pressure 3 times per week keep a blood pressure log take blood pressure log to all doctor appointments call doctor for signs and symptoms of high blood pressure keep all doctor appointments take  medications for blood pressure exactly as prescribed begin an exercise program report new symptoms to your doctor  Plan:  Next PCP appointment scheduled for: 04/09/24 at 3:30 PM Telephone follow up appointment with care management team member scheduled for:  05/07/24 at 1:30 PM          VBCI RN Care Plan related to throat discomfort       Problems:  Chronic Disease Management support and education needs related to throat discomfort Social Isolation  Deconditioning   Goal: Over the next 60 days the Caregiver will continue to work with Medical illustrator and/or Social Worker to address care management and care coordination needs related to throat pain/discomfort  as evidenced by adherence to care management team scheduled appointments      Interventions:   Evaluation of current treatment plan related to throat pain/discomfort , Social Isolation self-management and patient's adherence to plan as established by provider. Discussed plans with patient for ongoing care management follow up and provided patient with direct contact information for care management team Evaluation of current treatment plan related to throat pain/discomfort  and patient's adherence to plan as established by provider Discussed with wife Mollie Anger, patient is c/o feeling like something is stuck in his throat, this is a new problem, he continues to receive Osmolite by continuous feeding pump Reviewed patient's medications and determined patient has not been taking Nexium  due the consistency of the capsules granules do not enter the feeding tube very well Reviewed and discussed patient's PCP appointment scheduled for this afternoon for further evaluation of this problem  Patient Self-Care Activities:  Attend all scheduled provider appointments Call pharmacy for medication refills 3-7 days in advance of running out of medications Call provider office for new concerns or questions  Take medications as prescribed    Plan:   Follow up with provider re: throat pain/discomfort as directed  Telephone follow up appointment with care management team member scheduled for:  05/07/24 at 1:30 PM             Please call 1-800-273-TALK (toll free, 24 hour hotline) if you are experiencing a Mental Health or Behavioral Health Crisis or need someone to talk to.  Patient verbalizes understanding of instructions and care plan provided today and agrees to view in MyChart. Active MyChart status and patient understanding of how to access instructions and care plan via MyChart confirmed with patient.     Louanne Roussel RN BSN CCM   [facility], [address] [facility] Health Nurse Care Coordinator  Direct Dial: [phone] Website: [email or web address]

## 2024-04-13 ENCOUNTER — Ambulatory Visit: Payer: Self-pay | Admitting: Nurse Practitioner

## 2024-04-14 ENCOUNTER — Encounter: Payer: Self-pay | Admitting: Family Medicine

## 2024-04-14 ENCOUNTER — Other Ambulatory Visit (HOSPITAL_COMMUNITY): Payer: Self-pay

## 2024-04-14 ENCOUNTER — Telehealth: Payer: Self-pay

## 2024-04-14 NOTE — Telephone Encounter (Signed)
 Pharmacy Patient Advocate Encounter   Received notification from CoverMyMeds that prior authorization for Pantoprazole  Sodium 40MG  packets is required/requested.   Insurance verification completed.   The patient is insured through [facility] MEDICARE .   Per test claim: PA required; PA submitted to above mentioned insurance via CoverMyMeds Key/confirmation #/EOC (Key: [id number]) Status is pending

## 2024-04-14 NOTE — Telephone Encounter (Signed)
 Pharmacy Patient Advocate Encounter  Received notification from  [facility] MEDICARE that Prior Authorization for Pantoprazole  Sodium 40MG  packets has been APPROVED from 5.28.25 to 12.31.2099. Ran test claim, Copay is $189.99. This test claim was processed through [facility]- copay amounts may vary at other pharmacies due to pharmacy/plan contracts, or as the patient moves through the different stages of their insurance plan.   PA #/Case ID/Reference #: (Key: [id number])

## 2024-04-15 ENCOUNTER — Ambulatory Visit: Admitting: Medical

## 2024-04-16 DIAGNOSIS — J386 Stenosis of larynx: Secondary | ICD-10-CM | POA: Diagnosis not present

## 2024-04-22 ENCOUNTER — Other Ambulatory Visit: Payer: Self-pay

## 2024-04-22 NOTE — Patient Outreach (Signed)
 Complex Care Management   Visit Note  04/22/2024  Name:  Lee Barnes MRN: [id number] DOB: May 29, 1951  Situation: Referral received for Complex Care Management related to Hypertension; Neurogenic orthostatic hypotension; dysphagia with gastrojejunostomy tube; resolved squamous cell tongue Cancer; right vocal cord paralysis, throat discomfort; deconditioning. I obtained verbal consent from Caregiver.  Visit completed with [name] on the phone.  Background:   Past Medical History:  Diagnosis Date   Acute aspiration pneumonia (HCC) 11/13/2022   Allergy to environmental factors    Basal cell carcinoma of left cheek 07/17/2022   Complication of anesthesia    "vageled" after surgery -overnite stay   Diverticulosis 2008   Heart murmur    hx of in childhood    History of chemotherapy    History of radiation therapy 11/05/10-12/26/10   r base tongue, 7000 cGy 35 sessions   Hypertension    Medication started in May 2016.   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   Tinnitus     Assessment: Patient Reported Symptoms:  Cognitive Cognitive Status: Alert and oriented to person, place, and time Cognitive/Intellectual Conditions Management [RPT]: None reported or documented in medical history or problem list      Neurological Neurological Review of Symptoms: Not assessed    HEENT HEENT Symptoms Reported: Not assessed      Cardiovascular Cardiovascular Symptoms Reported: Not assessed    Respiratory Respiratory Symptoms Reported: Not assesed    Endocrine Patient reports the following symptoms related to hypoglycemia or hyperglycemia : Not assessed    Gastrointestinal Gastrointestinal Symptoms Reported: Diarrhea, Other Additional Gastrointestinal Details: history of Diverticulosis; Dysphagia; GERD; protein caloric malnutrition; resolved Squamous cell cancer of tongue; 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 Gastrointestinal Conditions: Diarrhea, Reflux/heartburn Gastrointestinal Management Strategies: Nutrition support (Osmolite and Ensure supplementation) Enteral Nutrition Time Frame: Continuous Gastrointestinal Self-Management Outcome: 4 (good) Nutrition Risk Screen (CP): Difficulty chewing/swallowing, Tube feeding or parenteral nutrition  Genitourinary Genitourinary Symptoms Reported: Not assessed    Integumentary Integumentary Symptoms Reported: Not assessed    Musculoskeletal Musculoskelatal Symptoms Reviewed: Not assessed        Psychosocial Psychosocial Symptoms Reported: Not assessed     Quality of Family Relationships: involved, helpful, supportive      11/11/2023   10:26 AM  Depression screen PHQ 2/9  Decreased Interest 0  Down, Depressed, Hopeless 0  PHQ - 2 Score 0    There were no vitals filed for this visit.  Medications Reviewed Today     Reviewed by Lee Pascal, RN (Registered Nurse) on 04/22/24 at 1115  Med List Status: <None>   Medication Order Taking? Sig Documenting Provider Last Dose Status Informant  acetaminophen  (TYLENOL ) 325 MG tablet [id number] No Place 2 tablets (650 mg total) into feeding tube every 6 (six) hours as needed for mild pain (or Fever >/= 101).  Patient not taking: Reported on 04/09/2024   Lee Lew, MD Not Taking Active            Med Note Lee Barnes   Tue Jan 27, 2024  2:26 PM) As needed  albuterol  (PROVENTIL ) (2.5 MG/3ML) 0.083% nebulizer solution [id number] No USE 3 MILLILITERS VIA NEBULIZATION BY MOUTH EVERY 4 HOURS AS NEEDED FOR WHEEZING Lee Hacking, MD Taking Active   cetirizine (ZYRTEC) 10 MG chewable tablet [id number] No Place 10 mg into feeding tube daily  as needed for allergies or rhinitis. [provider] Taking Active Self, Pharmacy Records  diphenhydrAMINE  (BENADRYL ) 25 MG tablet [id number] No Take 12.5 mg by mouth at bedtime as needed for allergies (to dry  secretions). [provider] Taking Active Self, Pharmacy Records           Med Note Lee Barnes, VERONICA Barnes   Tue Jan 27, 2024  2:26 PM) 1/2 tab nightly  ferrous sulfate  300 (60 Fe) MG/5ML syrup [id number] No Take 5 mLs (300 mg total) by mouth daily. Lee Duos, MD Taking Expired 04/09/24 2359 Self, Pharmacy Records           Med Note Lee Barnes   Thu Apr 10, 2023  1:33 PM) Crushing a 325mg  tablet daily and giving via tube  fluconazole  (DIFLUCAN ) 150 MG tablet [id number]  Crush and take one tablet per J tube today, repeat in 3 days. Lee Barnes  Active   fluticasone  (FLONASE ) 50 MCG/ACT nasal spray [id number] No SPRAY TWO SPRAYS IN EACH NOSTRIL ONCE DAILY Lee Hacking, MD Taking Active   guaiFENesin  (ROBITUSSIN) 100 MG/5ML liquid [id number]  Place 30 mLs into feeding tube 2 (two) times daily. Lee Barnes  Active   hydrALAZINE  (APRESOLINE ) 25 MG tablet [id number] No Place 1 tablet (25 mg total) into feeding tube every 8 (eight) hours as needed (for SBP greater than 175).  Patient not taking: Reported on 04/09/2024   Lee Lew, MD Not Taking Active            Med Note Lee Barnes, Lee Barnes   Fri Apr 09, 2024  1:28 PM) As needed if BP is elevated   midodrine  (PROAMATINE ) 5 MG tablet [id number] No Take 5 mg by mouth 3 (three) times daily with meals as needed (low BP). [provider] Taking Active Self           Med Note Lee Barnes, Lee Barnes   Tue Jan 27, 2024  2:28 PM) As needed  Nutritional Supplements (FEEDING SUPPLEMENT, OSMOLITE 1.5 CAL,) LIQD [id number] No Place 1,425 mLs into feeding tube daily. Lee Goad, MD Taking Active Self, Pharmacy Records           Med Note Lee Barnes Jan 16, 2023 11:11 AM) And protein supplement  nystatin  (MYCOSTATIN ) 100000 UNIT/ML suspension [id number]  Take 5 mLs (500,000 Units total) by mouth 4 (four) times daily. X 1 week. Swish and hold in mouth for at least 2 minutes and then spit Lee Barnes   Active   ondansetron  (ZOFRAN ) 4 MG/5ML solution [id number] No Place 4 mg into feeding tube every 8 (eight) hours as needed for nausea or vomiting. [provider] Taking Active Self  pantoprazole  sodium (PROTONIX ) 40 mg [id number]  Place 40 mg into feeding tube daily. Lee Barnes  Active   sodium chloride  (OCEAN) 0.65 % SOLN nasal spray [id number] No Place 1 spray into both nostrils as needed for congestion.  Patient not taking: Reported on 01/27/2024   Lee Duos, MD Not Taking Active Self, Pharmacy Records  sodium chloride  HYPERTONIC 3 % nebulizer solution [id number] No Take 4 mLs by nebulization daily. Regalado, Belkys A, MD Taking Active Self, Pharmacy Records  Med List Note Louvenia Roys, [place] 06/26/22 7628): Extra fluids at 1000 - Gatorade or Pedialyte            Recommendation:   DME requests:  other continuous feeding pump replacement and portable feeding  pump  Follow Up Plan:   Telephone follow up appointment date/time:  Friday, June 20 at 1:30 PM  Louanne Roussel RN BSN CCM [place]  [facility], [facility] Health Nurse Care Coordinator  Direct Dial: [phone] Website: [email or web address]

## 2024-04-23 NOTE — Patient Instructions (Signed)
 Visit Information  Thank you for taking time to visit with me today. Please don't hesitate to contact me if I can be of assistance to you before our next scheduled appointment.  Your next care management appointment is by telephone on Friday, June 20 at 1:30 PM  Please call the care guide team at [phone] if you need to cancel, schedule, or reschedule an appointment.   Please call 1-800-273-TALK (toll free, 24 hour hotline) if you are experiencing a Mental Health or Behavioral Health Crisis or need someone to talk to.  Louanne Roussel RN BSN CCM [place]  [facility], [facility] Health Nurse Care Coordinator  Direct Dial: [phone] Website: [email or web address]

## 2024-05-03 ENCOUNTER — Ambulatory Visit (HOSPITAL_COMMUNITY)
Admission: RE | Admit: 2024-05-03 | Discharge: 2024-05-03 | Disposition: A | Discharge status: Home / Self Care | Source: Ambulatory Visit | Attending: Interventional Radiology

## 2024-05-03 DIAGNOSIS — Z931 Gastrostomy status: Secondary | ICD-10-CM

## 2024-05-03 DIAGNOSIS — K9429 Other complications of gastrostomy: Secondary | ICD-10-CM | POA: Diagnosis not present

## 2024-05-03 DIAGNOSIS — Z434 Encounter for attention to other artificial openings of digestive tract: Secondary | ICD-10-CM | POA: Insufficient documentation

## 2024-05-03 HISTORY — PX: IR GJ TUBE CHANGE: IMG1440

## 2024-05-03 MED ORDER — LIDOCAINE VISCOUS HCL 2 % MT SOLN
OROMUCOSAL | Status: AC
  Filled 2024-05-03: qty 15

## 2024-05-03 MED ORDER — IOHEXOL 300 MG/ML  SOLN
50.0000 mL | Freq: Once | INTRAMUSCULAR | Status: AC | PRN
  Administered 2024-05-03: 20 mL

## 2024-05-03 MED ORDER — IOHEXOL 300 MG/ML  SOLN: 50.0000 mL | Freq: Once | INTRAMUSCULAR | Status: DC | PRN

## 2024-05-03 NOTE — Addendum Note (Signed)
 Encounter addended by: Adam Holm on: 05/03/2024 11:10 AM  Actions taken: Imaging Exam ended

## 2024-05-07 ENCOUNTER — Other Ambulatory Visit: Payer: Self-pay

## 2024-05-07 NOTE — Patient Instructions (Signed)
 Visit Information  Thank you for taking time to visit with me today. Please don't hesitate to contact me if I can be of assistance to you before our next scheduled appointment.  Your next care management appointment is by telephone on Monday, July 30 at 10:00 AM  Please call the care guide team at [phone] if you need to cancel, schedule, or reschedule an appointment.   Please call 1-800-273-TALK (toll free, 24 hour hotline) if you are experiencing a Mental Health or Behavioral Health Crisis or need someone to talk to.  Louanne Roussel RN BSN CCM [place]  [facility], [facility] Health Nurse Care Coordinator  Direct Dial: [phone] Website: [email or web address]

## 2024-05-07 NOTE — Patient Outreach (Signed)
 Complex Care Management   Visit Note  05/07/2024  Name:  Lee Barnes MRN: [id number] DOB: Nov 06, 1951  Situation: Referral received for Complex Care Management related to  Hypertension; Neurogenic orthostatic hypotension; dysphagia with tube feeding; impaired gait/limited mobility with leg and arm weakness. I obtained verbal consent from Caregiver.  Visit completed with wife [name] on the phone.  Background:   Past Medical History:  Diagnosis Date   Acute aspiration pneumonia (HCC) 11/13/2022   Allergy to environmental factors    Basal cell carcinoma of left cheek 07/17/2022   Complication of anesthesia    vageled after surgery -overnite stay   Diverticulosis 2008   Heart murmur    hx of in childhood    History of chemotherapy    History of radiation therapy 11/05/10-12/26/10   r base tongue, 7000 cGy 35 sessions   Hypertension    Medication started in May 2016.   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   Tinnitus     Assessment: Patient Reported Symptoms:  Cognitive Cognitive Status: Alert and oriented to person, place, and time (per patient's wife) Cognitive/Intellectual Conditions Management [RPT]: None reported or documented in medical history or problem list   Health Maintenance Behaviors: Annual physical exam  Neurological Neurological Review of Symptoms: Weakness (wife reports patient is experiencing right arm weakness) Neurological Management Strategies: Routine screening Neurological Self-Management Outcome: 3 (uncertain)  HEENT HEENT Symptoms Reported: Not assessed      Cardiovascular Cardiovascular Symptoms Reported: Not assessed    Respiratory Respiratory Symptoms Reported: Not assesed    Endocrine Patient reports the following symptoms related to hypoglycemia or hyperglycemia : Not assessed    Gastrointestinal Gastrointestinal Symptoms Reported: Change in appetite, Diarrhea,  Other Additional Gastrointestinal Details: history of Diverticulosis; Dysphagia; GERD; protein caloric malnutrition; s/p cancer treatment for squamous cell cancer of the tongue, 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 Gastrointestinal Conditions: Diarrhea, Reflux/heartburn Gastrointestinal Management Strategies: Nutrition support Enteral Nutrition Time Frame: Continuous Parenteral Nutrition Method of Administration: Continuous Gastrointestinal Comment: Osmolite is the formula used Nutrition Risk Screen (CP): Tube feeding or parenteral nutrition  Genitourinary Genitourinary Symptoms Reported: Not assessed    Integumentary Integumentary Symptoms Reported: Not assessed    Musculoskeletal Musculoskelatal Symptoms Reviewed: Unsteady gait, Weakness Additional Musculoskeletal Details: right arm weakness; bilateral leg weakness secondary to deconditioning Musculoskeletal Conditions: Unsteady gait, Mobility limited Musculoskeletal Management Strategies: Medical device, Routine screening, Adequate rest Musculoskeletal Self-Management Outcome: 4 (good) Falls in the past year?: No Number of falls in past year: 1 or less Was there an injury with Fall?: No Fall Risk Category Calculator: 0 Patient Fall Risk Level: Low Fall Risk Patient at Risk for Falls Due to: Impaired balance/gait, Impaired mobility  Psychosocial Psychosocial Symptoms Reported: Not assessed   Major Change/Loss/Stressor/Fears (CP): Medical condition, self Quality of Family Relationships: helpful, involved, supportive      11/11/2023   10:26 AM  Depression screen PHQ 2/9  Decreased Interest 0  Down, Depressed, Hopeless 0  PHQ - 2 Score 0    There were no vitals filed for this visit.  Medications Reviewed Today   Medications were not reviewed in this encounter Unable to review medications with patient/wife due to having an extended call with DME company    Recommendation:   Specialty  provider follow-up with [employer] to assist with feeding pump replacement   Follow Up Plan:  Telephone follow up appointment date/time:  Monday, June 30 at 10:00 AM  Louanne Roussel RN BSN CCM [place]  [facility], [facility] Health Nurse Care Coordinator  Direct Dial: [phone] Website: [email or web address]

## 2024-05-10 ENCOUNTER — Other Ambulatory Visit (HOSPITAL_COMMUNITY): Payer: Medicare Other

## 2024-05-17 ENCOUNTER — Other Ambulatory Visit: Payer: Self-pay

## 2024-05-17 DIAGNOSIS — L821 Other seborrheic keratosis: Secondary | ICD-10-CM | POA: Diagnosis not present

## 2024-05-17 DIAGNOSIS — Z85828 Personal history of other malignant neoplasm of skin: Secondary | ICD-10-CM | POA: Diagnosis not present

## 2024-05-17 DIAGNOSIS — C44319 Basal cell carcinoma of skin of other parts of face: Secondary | ICD-10-CM | POA: Diagnosis not present

## 2024-05-17 DIAGNOSIS — L570 Actinic keratosis: Secondary | ICD-10-CM | POA: Diagnosis not present

## 2024-05-17 DIAGNOSIS — D485 Neoplasm of uncertain behavior of skin: Secondary | ICD-10-CM | POA: Diagnosis not present

## 2024-05-17 DIAGNOSIS — B351 Tinea unguium: Secondary | ICD-10-CM | POA: Diagnosis not present

## 2024-05-17 NOTE — Patient Instructions (Signed)
 Visit Information  Thank you for taking time to visit with me today. Please don't hesitate to contact me if I can be of assistance to you before our next scheduled appointment.  Your next care management appointment is by telephone on Friday, July 8, at 09:30 AM  Please call the care guide team at [phone] if you need to cancel, schedule, or reschedule an appointment.   Please call 1-800-273-TALK (toll free, 24 hour hotline) if you are experiencing a Mental Health or Behavioral Health Crisis or need someone to talk to.  Clayborne Ly RN BSN CCM [place]  [facility], [facility] Health Nurse Care Coordinator  Direct Dial: [phone] Website: [email or web address]

## 2024-05-17 NOTE — Patient Outreach (Signed)
 Complex Care Management   Visit Note  05/17/2024  Name:  Lee Barnes MRN: [id number] DOB: [birth year]  Situation: Referral received for Complex Care Management related to   Hypertension; Neurogenic orthostatic hypotension; dysphagia with tube feeding; impaired gait/limited mobility with leg and arm weakness. I obtained verbal consent from Caregiver.  Visit completed with [name] on the phone.  Background:   Past Medical History:  Diagnosis Date   Acute aspiration pneumonia (HCC) 11/13/2022   Allergy to environmental factors    Basal cell carcinoma of left cheek 07/17/2022   Complication of anesthesia    vageled after surgery -overnite stay   Diverticulosis 2008   Heart murmur    hx of in childhood    History of chemotherapy    History of radiation therapy 11/05/10-12/26/10   r base tongue, 7000 cGy 35 sessions   Hypertension    Medication started in May 2016.   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   Tinnitus     Assessment: Patient Reported Symptoms:  Cognitive Cognitive Status: Alert and oriented to person, place, and time Cognitive/Intellectual Conditions Management [RPT]: None reported or documented in medical history or problem list   Health Maintenance Behaviors: Annual physical exam  Neurological Neurological Review of Symptoms: Not assessed    HEENT HEENT Symptoms Reported: Sore throat HEENT Management Strategies: Routine screening HEENT Self-Management Outcome: 4 (good) HEENT Comment: patient is established with ENT, [name] states this symptom has improved    Cardiovascular Cardiovascular Symptoms Reported: Not assessed    Respiratory Respiratory Symptoms Reported: Productive cough Additional Respiratory Details: allergic rhinitis; GERD; hx of aspirational pneumonia Respiratory Management Strategies: Medication therapy, Routine screening, Diet modification Respiratory  Self-Management Outcome: 4 (good)  Endocrine Endocrine Symptoms Reported: Not assessed    Gastrointestinal Gastrointestinal Symptoms Reported: Change in appetite, Diarrhea, Reflux/heartburn Additional Gastrointestinal Details: history of Diverticulosis; Dysphagia; GERD; protein caloric malnutrition; s/p cancer treatment for squamous cell cancer of the tongue, 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 Gastrointestinal Management Strategies: Nutrition support Enteral Nutrition Time Frame: Continuous Gastrointestinal Self-Management Outcome: 4 (good) Gastrointestinal Comment: Osmolite and other feeding supplies ordered through [facility] Nutrition Risk Screen (CP): Tube feeding or parenteral nutrition  Genitourinary Genitourinary Symptoms Reported: Frequency (routine monitoring) Genitourinary Management Strategies: Fluid modification Genitourinary Self-Management Outcome: 3 (uncertain) Genitourinary Comment: Patient is established with [facility] and has an upcoming appointment  Integumentary Integumentary Symptoms Reported: Itching Skin Management Strategies: Routine screening, Medication therapy Skin Self-Management Outcome: 3 (uncertain) Skin Comment: Patient is scheduled to Barnes/u with Dermatology this afternoon  Musculoskeletal Musculoskelatal Symptoms Reviewed: Weakness, Unsteady gait Additional Musculoskeletal Details: right arm weakness; bilateral leg weakness secondary to deconditioning Musculoskeletal Management Strategies: Adequate rest, Medical device, Routine screening Musculoskeletal Self-Management Outcome: 4 (good)      Psychosocial Psychosocial Symptoms Reported: Not assessed   Major Change/Loss/Stressor/Fears (CP): Medical condition, self Techniques to Cope with Loss/Stress/Change: Diversional activities (support system w/family) Quality of Family Relationships: involved, helpful, supportive      11/11/2023   10:26 AM   Depression screen PHQ 2/9  Decreased Interest 0  Down, Depressed, Hopeless 0  PHQ - 2 Score 0    There were no vitals filed for this visit.  Medications Reviewed Today     Reviewed by Lee Clayborne CROME, RN (Registered Nurse) on 05/17/24 at 1017  Med List Status: <None>   Medication  Order Taking? Sig Documenting Provider Last Dose Status Informant  acetaminophen  (TYLENOL ) 325 MG tablet [id number]  Place 2 tablets (650 mg total) into feeding tube every 6 (six) hours as needed for mild pain (or Fever >/= 101).  Patient not taking: Reported on 04/09/2024   Lee Lee MATSU, Barnes  Active            Med Note Lee Barnes   Tue Jan 27, 2024  2:26 PM) As needed  albuterol  (PROVENTIL ) (2.5 MG/3ML) 0.083% nebulizer solution [id number]  USE 3 MILLILITERS VIA NEBULIZATION BY MOUTH EVERY 4 HOURS AS NEEDED FOR WHEEZING [name], Lee Barnes  Active   cetirizine (ZYRTEC) 10 MG chewable tablet [id number]  Place 10 mg into feeding tube daily as needed for allergies or rhinitis. Provider, Historical, Barnes  Active Self, Pharmacy Records  diphenhydrAMINE  (BENADRYL ) 25 MG tablet [id number]  Take 12.5 mg by mouth at bedtime as needed for allergies (to dry secretions). Provider, Historical, Barnes  Active Self, Pharmacy Records           Med Note Lee Barnes   Tue Jan 27, 2024  2:26 PM) 1/2 tab nightly  ferrous sulfate  300 (60 Fe) MG/5ML syrup [id number]  Take 5 mLs (300 mg total) by mouth daily.  Patient not taking: Reported on 05/17/2024   Lee Barnes LABOR, Barnes  Expired 04/09/24 2359 Self, Pharmacy Records           Med Note Lee Barnes   Thu Apr 10, 2023  1:33 PM) Crushing a 325mg  tablet daily and giving via tube  fluconazole  (DIFLUCAN ) 150 MG tablet [id number]  Crush and take one tablet per J tube today, repeat in 3 days. Early, Lee Barnes  Active   fluticasone  (FLONASE ) 50 MCG/ACT nasal spray [id number]  SPRAY TWO SPRAYS IN EACH NOSTRIL ONCE DAILY Lee Barnes BROCKS, Barnes  Active   guaiFENesin   (ROBITUSSIN) 100 MG/5ML liquid [id number]  Place 30 mLs into feeding tube 2 (two) times daily. Early, Lee Barnes  Active   hydrALAZINE  (APRESOLINE ) 25 MG tablet [id number]  Place 1 tablet (25 mg total) into feeding tube every 8 (eight) hours as needed (for SBP greater than 175).  Patient not taking: Reported on 04/09/2024   Lee Lee MATSU, Barnes  Active            Med Note LEOPOLDO, CLAYBORNE Barnes   Fri Apr 09, 2024  1:28 PM) As needed if BP is elevated   midodrine  (PROAMATINE ) 5 MG tablet [id number]  Take 5 mg by mouth 3 (three) times daily with meals as needed (low BP). Provider, Historical, Barnes  Active Self           Med Note Lee Barnes   Tue Jan 27, 2024  2:28 PM) As needed  Nutritional Supplements (FEEDING SUPPLEMENT, OSMOLITE 1.5 CAL,) LIQD [id number]  Place 1,425 mLs into feeding tube daily. Briana Elgin LABOR, Barnes  Active Self, Pharmacy Records           Med Note Lee Barnes Schaumann Jan 16, 2023 11:11 AM) And protein supplement  nystatin  (MYCOSTATIN ) 100000 UNIT/ML suspension [id number]  Take 5 mLs (500,000 Units total) by mouth 4 (four) times daily. X 1 week. Swish and hold in mouth for at least 2 minutes and then spit  Patient not taking: Reported on 05/17/2024   Oris Camie BRAVO, Barnes  Active   ondansetron  (ZOFRAN ) 4 MG/5ML solution [id number]  Place 4 mg into feeding tube every 8 (  eight) hours as needed for nausea or vomiting. Provider, Historical, Barnes  Active Self  pantoprazole  sodium (PROTONIX ) 40 mg [id number]  Place 40 mg into feeding tube daily.  Patient not taking: Reported on 05/17/2024   Early, Lee Barnes  Active   sodium chloride  (OCEAN) 0.65 % SOLN nasal spray [id number]  Place 1 spray into both nostrils as needed for congestion.  Patient not taking: Reported on 01/27/2024   Regalado, Belkys A, Barnes  Active Self, Pharmacy Records  sodium chloride  HYPERTONIC 3 % nebulizer solution [id number]  Take 4 mLs by nebulization daily. Lee Barnes LABOR, Barnes  Active Self, Pharmacy Records  Med List  Note Isabel Doneta GORMAN Bishop 06/26/22 [id number]): Extra fluids at 1000 - Gatorade or Pedialyte            Recommendation:   PCP Follow-up Continue Current Plan of Care  Follow Up Plan:   Telephone follow up appointment date/time:  Friday, August 8 at 09:30 AM  Clayborne Ly RN BSN CCM [place]  [facility], [facility] Nurse Care Coordinator  Direct Dial: [phone] Website: [email or web address]

## 2024-06-23 DIAGNOSIS — Z85828 Personal history of other malignant neoplasm of skin: Secondary | ICD-10-CM | POA: Diagnosis not present

## 2024-06-23 DIAGNOSIS — C44319 Basal cell carcinoma of skin of other parts of face: Secondary | ICD-10-CM | POA: Diagnosis not present

## 2024-06-25 ENCOUNTER — Telehealth

## 2024-06-28 ENCOUNTER — Other Ambulatory Visit: Payer: Self-pay

## 2024-06-28 DIAGNOSIS — C4490 Unspecified malignant neoplasm of skin, unspecified: Secondary | ICD-10-CM

## 2024-06-28 DIAGNOSIS — Z85819 Personal history of malignant neoplasm of unspecified site of lip, oral cavity, and pharynx: Secondary | ICD-10-CM

## 2024-06-28 DIAGNOSIS — G903 Multi-system degeneration of the autonomic nervous system: Secondary | ICD-10-CM

## 2024-06-28 NOTE — Patient Instructions (Signed)
 Visit Information  Thank you for taking time to visit with me today. Please don't hesitate to contact me if I can be of assistance to you before our next scheduled appointment.  Your next care management appointment is by telephone on Thursday, September 11 at 09:30 AM  Please call the care guide team at [phone] if you need to cancel, schedule, or reschedule an appointment.   Please call [phone]-TALK (toll free, 24 hour hotline) if you are experiencing a Mental Health or Behavioral Health Crisis or need someone to talk to.  Clayborne Ly RN BSN CCM [place]  [facility], [facility] Nurse Care Coordinator  Direct Dial: [phone] Website: [email or web address]

## 2024-06-28 NOTE — Patient Outreach (Signed)
 Complex Care Management   Visit Note  06/28/2024  Name:  Lee Barnes MRN: [id number] DOB: 07-12-1951  Situation: Referral received for Complex Care Management related to Hypertension; Neurogenic orthostatic hypotension; dysphagia with tube feeding; impaired gait/limited mobility with leg and arm weakness. I obtained verbal consent from Caregiver.  Visit completed with [name]  on the phone.  Background:   Past Medical History:  Diagnosis Date   Acute aspiration pneumonia (HCC) 11/13/2022   Allergy to environmental factors    Basal cell carcinoma of left cheek 07/17/2022   Complication of anesthesia    vageled after surgery -overnite stay   Diverticulosis 2008   Heart murmur    hx of in childhood    History of chemotherapy    History of radiation therapy 11/05/10-12/26/10   r base tongue, 7000 cGy 35 sessions   Hypertension    Medication started in May 2016.   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   Tinnitus     Assessment: Patient Reported Symptoms:  Cognitive Cognitive Status: Able to follow simple commands, Alert and oriented to person, place, and time (spoke with patient's [name]) Cognitive/Intellectual Conditions Management [RPT]: None reported or documented in medical history or problem list   Health Maintenance Behaviors: Annual physical exam, Healthy diet Health Facilitated by: Rest, Healthy diet  Neurological Neurological Review of Symptoms: No symptoms reported    HEENT HEENT Symptoms Reported: No symptoms reported      Cardiovascular Cardiovascular Symptoms Reported: No symptoms reported Does patient have uncontrolled Hypertension?: No Cardiovascular Management Strategies: Routine screening, Medication therapy, Adequate rest Cardiovascular Self-Management Outcome: 4 (good)  Respiratory Respiratory Symptoms Reported: Productive cough Respiratory Management Strategies: Medication  therapy, Routine screening Respiratory Self-Management Outcome: 4 (good)  Endocrine Endocrine Symptoms Reported: No symptoms reported    Gastrointestinal Gastrointestinal Symptoms Reported: Change in appetite, Reflux/heartburn, Other Other Gastrointestinal Symptoms: history of Diverticulosis; Dysphagia; GERD; protein caloric malnutrition; s/p cancer treatment for squamous cell cancer of the tongue, 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 Gastrointestinal Management Strategies: Nutrition support, Medication therapy Enteral Nutrition Time Frame: Continuous Parenteral Nutrition Method of Administration: Continuous Gastrointestinal Self-Management Outcome: 3 (uncertain)    Genitourinary Genitourinary Symptoms Reported: No symptoms reported    Integumentary Integumentary Symptoms Reported: Wound Additional Integumentary Details: post surgical incision secondary to skin canceer removal over mandible Skin Management Strategies: Dressing changes, Routine screening Skin Self-Management Outcome: 4 (good)  Musculoskeletal Musculoskelatal Symptoms Reviewed: Weakness Musculoskeletal Management Strategies: Routine screening, Adequate rest Musculoskeletal Self-Management Outcome: 3 (uncertain)      Psychosocial Psychosocial Symptoms Reported: No symptoms reported Behavioral Management Strategies: Adequate rest, Support system Behavioral Health Self-Management Outcome: 4 (good) Major Change/Loss/Stressor/Fears (CP): Medical condition, self Quality of Family Relationships: supportive, helpful, involved Do you feel physically threatened by others?: No      11/11/2023   10:26 AM  Depression screen PHQ 2/9  Decreased Interest 0  Down, Depressed, Hopeless 0  PHQ - 2 Score 0    There were no vitals filed for this visit.  Medications Reviewed Today     Reviewed by Lee Lee CROME, RN (Registered Nurse) on 06/28/24 at 1100  Med List Status: <None>   Medication  Order Taking? Sig Documenting Provider Last Dose Status Informant  acetaminophen  (TYLENOL ) 325 MG tablet [id number] No Place 2 tablets (650 mg total) into feeding tube every 6 (six) hours as needed  for mild pain (or Fever >/= 101).  Patient not taking: Reported on 04/09/2024   Lee Lee Barnes MATSU, MD Not Taking Active            Med Note Lee Barnes   Tue Jan 27, 2024  2:26 PM) As needed  albuterol  (PROVENTIL ) (2.5 MG/3ML) 0.083% nebulizer solution [id number] No USE 3 MILLILITERS VIA NEBULIZATION BY MOUTH EVERY 4 HOURS AS NEEDED FOR WHEEZING Lee Norleen BROCKS, MD Taking Active   cetirizine (ZYRTEC) 10 MG chewable tablet [id number] No Place 10 mg into feeding tube daily as needed for allergies or rhinitis. [provider] Taking Active Self, Pharmacy Records  diphenhydrAMINE  (BENADRYL ) 25 MG tablet [id number] No Take 12.5 mg by mouth at bedtime as needed for allergies (to dry secretions). [provider] Taking Active Self, Pharmacy Records           Med Note Lee, Lee Barnes   Tue Jan 27, 2024  2:26 PM) 1/2 tab nightly  ferrous sulfate  300 (60 Fe) MG/5ML syrup [id number]  Take 5 mLs (300 mg total) by mouth daily.  Patient not taking: Reported on 05/17/2024   Lee Lee Barnes LABOR, MD  Expired 04/09/24 2359 Self, Pharmacy Records           Med Note Lee Barnes   Thu Apr 10, 2023  1:33 PM) Crushing a 325mg  tablet daily and giving via tube  fluconazole  (DIFLUCAN ) 150 MG tablet [id number]  Crush and take one tablet per J tube today, repeat in 3 days. Lee Barnes, Lee E, NP  Active   fluticasone  (FLONASE ) 50 MCG/ACT nasal spray [id number] No SPRAY TWO SPRAYS IN EACH NOSTRIL ONCE DAILY Lee Norleen BROCKS, MD Taking Active   guaiFENesin  (ROBITUSSIN) 100 MG/5ML liquid [id number]  Place 30 mLs into feeding tube 2 (two) times daily. Lee Barnes, Lee E, NP  Active   hydrALAZINE  (APRESOLINE ) 25 MG tablet [id number] No Place 1 tablet (25 mg total) into feeding tube every 8 (eight) hours as needed (for  SBP greater than 175).  Patient not taking: Reported on 04/09/2024   Lee Lee Barnes MATSU, MD Not Taking Active            Med Note Lee Barnes, Lee Barnes   Fri Apr 09, 2024  1:28 PM) As needed if BP is elevated   midodrine  (PROAMATINE ) 5 MG tablet [id number] No Take 5 mg by mouth 3 (three) times daily with meals as needed (low BP). [provider] Taking Active Self           Med Note Lee, LUCIENNE Lee Barnes   Tue Jan 27, 2024  2:28 PM) As needed  Nutritional Supplements (FEEDING SUPPLEMENT, OSMOLITE 1.5 CAL,) LIQD [id number] No Place 1,425 mLs into feeding tube daily. Briana Elgin LABOR, MD Taking Active Self, Pharmacy Records           Med Note Lee Barnes Charlotte Jan 16, 2023 11:11 AM) And protein supplement  nystatin  (MYCOSTATIN ) 100000 UNIT/ML suspension [id number]  Take 5 mLs (500,000 Units total) by mouth 4 (four) times daily. X 1 week. Swish and hold in mouth for at least 2 minutes and then spit  Patient not taking: Reported on 05/17/2024   Oris Camie BRAVO, NP  Active   ondansetron  (ZOFRAN ) 4 MG/5ML solution [id number] No Place 4 mg into feeding tube every 8 (eight) hours as needed for nausea or vomiting. [provider] Taking Active Self  pantoprazole  sodium (PROTONIX ) 40 mg [id number]  Place 40 mg into feeding tube daily.  Patient not taking: Reported on 05/17/2024   Lee Barnes, Lee E, NP  Active   sodium chloride  (OCEAN) 0.65 % SOLN nasal spray [id number] No Place 1 spray into both nostrils as needed for congestion.  Patient not taking: Reported on 01/27/2024   Lee Lee Barnes LABOR, MD Not Taking Active Self, Pharmacy Records  sodium chloride  HYPERTONIC 3 % nebulizer solution [id number] No Take 4 mLs by nebulization daily. Lee Lee Barnes LABOR, MD Taking Active Self, Pharmacy Records  Med List Note Isabel Doneta GORMAN Bishop 06/26/22 [id number]): Extra fluids at 1000 - Gatorade or Pedialyte            Recommendation:   Specialty provider follow-up with Dermatology as directed  Continue Current  Plan of Care  Follow Up Plan:   Telephone follow up appointment date/time:  Thursday, September 11 at 09:30 AM Referral to Licensed Clinical Social Worker  Lee Ly RN BSN CCM [employer] [facility], [facility] Nurse Care Coordinator  Direct Dial: [phone] Website: [email or web address]

## 2024-07-07 ENCOUNTER — Other Ambulatory Visit: Payer: Self-pay | Admitting: Licensed Clinical Social Worker

## 2024-07-07 NOTE — Patient Outreach (Signed)
 Complex Care Management   Visit Note  07/07/2024  Name:  GRYPHON VANDERVEEN MRN: [id number] DOB: December 31, 1950  Situation: Referral received for Complex Care Management related to Caregiver Stress I obtained verbal consent from Caregiver.  Visit completed with Caregiver  on the phone  Background:   Past Medical History:  Diagnosis Date   Acute aspiration pneumonia (HCC) 11/13/2022   Allergy to environmental factors    Basal cell carcinoma of left cheek 07/17/2022   Complication of anesthesia    vageled after surgery -overnite stay   Diverticulosis 2008   Heart murmur    hx of in childhood    History of chemotherapy    History of radiation therapy 11/05/10-12/26/10   r base tongue, 7000 cGy 35 sessions   Hypertension    Medication started in May 2016.   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   Tinnitus     Assessment: Patient Reported Symptoms:  Cognitive Cognitive Status: Alert and oriented to person, place, and time, Able to follow simple commands Cognitive/Intellectual Conditions Management [RPT]: None reported or documented in medical history or problem list   Health Maintenance Behaviors: Annual physical exam, Healthy diet  Neurological Neurological Review of Symptoms: No symptoms reported    HEENT HEENT Symptoms Reported: No symptoms reported      Cardiovascular Cardiovascular Symptoms Reported: No symptoms reported    Respiratory Respiratory Symptoms Reported: No symptoms reported    Endocrine Endocrine Symptoms Reported: No symptoms reported    Gastrointestinal Gastrointestinal Symptoms Reported: Not assessed      Genitourinary Genitourinary Symptoms Reported: No symptoms reported    Integumentary Integumentary Symptoms Reported: Not assessed    Musculoskeletal Musculoskelatal Symptoms Reviewed: Weakness Musculoskeletal Management Strategies: Routine screening, Adequate rest       Psychosocial Psychosocial Symptoms Reported: Other Other Psychosocial Conditions: Stress Behavioral Management Strategies: Adequate rest, Support system, Coping strategies, Community resources Major Change/Loss/Stressor/Fears (CP): Medical condition, self, Medical condition, family Techniques to [place] with Loss/Stress/Change: Diversional activities Quality of Family Relationships: helpful, involved, supportive    07/07/2024    PHQ2-9 Depression Screening   Little interest or pleasure in doing things    Feeling down, depressed, or hopeless    PHQ-2 - Total Score    Trouble falling or staying asleep, or sleeping too much    Feeling tired or having little energy    Poor appetite or overeating     Feeling bad about yourself - or that you are a failure or have let yourself or your family down    Trouble concentrating on things, such as reading the newspaper or watching television    Moving or speaking so slowly that other people could have noticed.  Or the opposite - being so fidgety or restless that you have been moving around a lot more than usual    Thoughts that you would be better off dead, or hurting yourself in some way    PHQ2-9 Total Score    If you checked off any problems, how difficult have these problems made it for you to do your work, take care of things at home, or get along with other people    Depression Interventions/Treatment      There were no vitals filed for this visit.  Medications Reviewed Today     Reviewed by Ezzard Rolin BIRCH, LCSW (Social Worker) on 07/07/24 at 1509  Med List Status: <None>  Medication Order Taking? Sig Documenting Provider Last Dose Status Informant  acetaminophen  (TYLENOL ) 325 MG tablet [id number] No Place 2 tablets (650 mg total) into feeding tube every 6 (six) hours as needed for mild pain (or Fever >/= 101).  Patient not taking: Reported on 04/09/2024   Will [name], MD Not Taking Active            Med Note [name] LUCIENNE FALCON    Tue Jan 27, 2024  2:26 PM) As needed  albuterol  (PROVENTIL ) (2.5 MG/3ML) 0.083% nebulizer solution [id number] No USE 3 MILLILITERS VIA NEBULIZATION BY MOUTH EVERY 4 HOURS AS NEEDED FOR WHEEZING Joyce Norleen BROCKS, MD Taking Active   cetirizine (ZYRTEC) 10 MG chewable tablet [id number] No Place 10 mg into feeding tube daily as needed for allergies or rhinitis. [provider] Taking Active Self, Pharmacy Records  diphenhydrAMINE  (BENADRYL ) 25 MG tablet [id number] No Take 12.5 mg by mouth at bedtime as needed for allergies (to dry secretions). [provider] Taking Active Self, Pharmacy Records           Med Note [name], [name]   Tue Jan 27, 2024  2:26 PM) 1/2 tab nightly  ferrous sulfate  300 (60 Fe) MG/5ML syrup [id number]  Take 5 mLs (300 mg total) by mouth daily.  Patient not taking: Reported on 05/17/2024   Madelyne Serum A, MD  Expired 04/09/24 2359 Self, Pharmacy Records           Med Note JANEAN JON DEL   Thu Apr 10, 2023  1:33 PM) Crushing a 325mg  tablet daily and giving via tube  fluconazole  (DIFLUCAN ) 150 MG tablet [id number]  Crush and take one tablet per J tube today, repeat in 3 days. Early, Sara E, NP  Active   fluticasone  (FLONASE ) 50 MCG/ACT nasal spray [id number] No SPRAY TWO SPRAYS IN EACH NOSTRIL ONCE DAILY Joyce Norleen BROCKS, MD Taking Active   guaiFENesin  [facility]) 100 MG/5ML liquid [id number]  Place 30 mLs into feeding tube 2 (two) times daily. Early, Sara E, NP  Active   hydrALAZINE  (APRESOLINE ) 25 MG tablet [id number] No Place 1 tablet (25 mg total) into feeding tube every 8 (eight) hours as needed (for SBP greater than 175).  Patient not taking: Reported on 04/09/2024   Will [name], MD Not Taking Active            Med Note LEOPOLDO, CLAYBORNE CROME   Fri Apr 09, 2024  1:28 PM) As needed if BP is elevated   midodrine  (PROAMATINE ) 5 MG tablet [id number] No Take 5 mg by mouth 3 (three) times daily with meals as needed (low BP). [provider] Taking  Active Self           Med Note [name], LUCIENNE FALCON   Tue Jan 27, 2024  2:28 PM) As needed  Nutritional Supplements (FEEDING SUPPLEMENT, OSMOLITE 1.5 CAL,) LIQD [id number] No Place 1,425 mLs into feeding tube daily. Briana Elgin LABOR, MD Taking Active Self, Pharmacy Records           Med Note [name] LUCIENNE FALCON [place] Jan 16, 2023 11:11 AM) And protein supplement  nystatin  (MYCOSTATIN ) 100000 UNIT/ML suspension [id number]  Take 5 mLs (500,000 Units total) by mouth 4 (four) times daily. X 1 week. Swish and hold in mouth for at least 2 minutes and then spit  Patient not taking: Reported on 05/17/2024   Early, Sara E, NP  Active   ondansetron  (ZOFRAN ) 4 MG/5ML solution [id number] No Place 4 mg into  feeding tube every 8 (eight) hours as needed for nausea or vomiting. [provider] Taking Active Self  pantoprazole  sodium (PROTONIX ) 40 mg [id number]  Place 40 mg into feeding tube daily.  Patient not taking: Reported on 05/17/2024   Early, Sara E, NP  Active   sodium chloride  (OCEAN) 0.65 % SOLN nasal spray [id number] No Place 1 spray into both nostrils as needed for congestion.  Patient not taking: Reported on 01/27/2024   Regalado, Belkys A, MD Not Taking Active Self, Pharmacy Records  sodium chloride  HYPERTONIC 3 % nebulizer solution [id number] No Take 4 mLs by nebulization daily. Madelyne Owen LABOR, MD Taking Active Self, Pharmacy Records  Med List Note Isabel Doneta GORMAN Bishop 06/26/22 9755): Extra fluids at 1000 - Gatorade or Pedialyte            Recommendation:   Continue Current Plan of Care  Follow Up Plan:   Telephone follow-up in 1 month  Rolin Kerns, LCSW [facility]  [facility], [facility] Clinical Social Worker Direct Dial: [phone]  Fax: [phone] Website: [email or web address] 3:20 PM

## 2024-07-07 NOTE — Patient Instructions (Signed)
 Visit Information  Thank you for taking time to visit with me today. Please don't hesitate to contact me if I can be of assistance to you before our next scheduled appointment.  Our next appointment is by telephone on 09/26 at 12:30 PM Please call the care guide team at [phone] if you need to cancel or reschedule your appointment.   Following is a copy of your care plan:   Goals Addressed             This Visit's Progress    LCSW VBCI Social Work Care Plan   On track    Problems:   Disease Management support and education needs related to Caregiver Stress  CSW Clinical Goal(s):   Over the next 90 days the Patient and Caregiver (Spouse) will attend all scheduled medical appointments as evidenced by patient report and care team review of appointment completion in electronic MEDICAL RECORD NUMBER  work with Child psychotherapist to address concerns related to stress management.  Interventions:  Mental Health:  Evaluation of current treatment plan related to Caregiver Stress Active listening / Reflection utilized Caregiver stress acknowledged :Pt and Caregiver are assisting in the care of elderly parents Discussed caregiver resources and support: LCSW discussed services through [facility], in addition to providing supportive resources on LTC facilities in [facility] Emotional Support Provided Problem Solving /Task Center strategies reviewed  Patient Goals/Self-Care Activities:  Increase coping skills and stress reduction Review supportive resources discussed  Plan:   Telephone follow up appointment with care management team member scheduled for:  4 weeks        Please call the Suicide and Crisis Lifeline: 988 go to [facility] [address] [place] [phone]) call 911 if you are experiencing a Mental Health or Behavioral Health Crisis or need someone to talk to.  Patient verbalizes understanding of instructions and  care plan provided today and agrees to view in MyChart. Active MyChart status and patient understanding of how to access instructions and care plan via MyChart confirmed with patient.     Rolin Ezzard HUGHS [facility]  [facility], [facility] Clinical Social Worker Direct Dial: [phone]  Fax: [phone] Website: [email or web address] 3:20 PM

## 2024-07-29 ENCOUNTER — Other Ambulatory Visit: Payer: Self-pay

## 2024-07-29 ENCOUNTER — Encounter: Payer: Self-pay | Admitting: Family Medicine

## 2024-07-29 NOTE — Patient Instructions (Signed)
 Visit Information  Thank you for taking time to visit with me today. Please don't hesitate to contact me if I can be of assistance to you before our next scheduled appointment.  Your next care management appointment is by telephone on Thursday, October 7 at 09:30 AM  Please call the care guide team at [phone] if you need to cancel, schedule, or reschedule an appointment.   Please call 1-800-273-TALK (toll free, 24 hour hotline) if you are experiencing a Mental Health or Behavioral Health Crisis or need someone to talk to.  Clayborne Ly RN BSN CCM [place]  [facility], [facility] Nurse Care Coordinator  Direct Dial: [phone] Website: [email or web address]

## 2024-07-29 NOTE — Patient Outreach (Signed)
 Complex Care Management   Visit Note  07/29/2024  Name:  Lee Barnes MRN: [id number] DOB: 04-06-1951  Situation: Referral received for Complex Care Management related to  Hypertension; Neurogenic orthostatic hypotension; dysphagia with tube feeding; impaired gait/limited mobility with leg and arm weakness. I obtained verbal consent from Patient.  Visit completed with Patient on the phone.  Background:   Past Medical History:  Diagnosis Date   Acute aspiration pneumonia (HCC) 11/13/2022   Allergy to environmental factors    Basal cell carcinoma of left cheek 07/17/2022   Complication of anesthesia    vageled after surgery -overnite stay   Diverticulosis 2008   Heart murmur    hx of in childhood    History of chemotherapy    History of radiation therapy 11/05/10-12/26/10   r base tongue, 7000 cGy 35 sessions   Hypertension    Medication started in May 2016.   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   Tinnitus     Assessment: Patient Reported Symptoms:  Cognitive Cognitive Status: Alert and oriented to person, place, and time, Requires Assistance Decision Making, Normal speech and language skills, Struggling with memory recall (completed call with [name]) Cognitive/Intellectual Conditions Management [RPT]: None reported or documented in medical history or problem list   Health Maintenance Behaviors: Annual physical exam  Neurological Neurological Review of Symptoms: Weakness Neurological Management Strategies: Adequate rest, Routine screening Neurological Self-Management Outcome: 4 (good) Neurological Comment: weakness in right arm secondary to past radiation therapy  HEENT HEENT Symptoms Reported: Not assessed      Cardiovascular Cardiovascular Symptoms Reported: No symptoms reported    Respiratory Respiratory Symptoms Reported: No symptoms reported    Endocrine Endocrine Symptoms Reported:  Not assessed    Gastrointestinal Gastrointestinal Symptoms Reported: Change in appetite Gastrointestinal Management Strategies: Adequate rest, Ileostomy, Nutrition support, Medication therapy Enteral Nutrition Time Frame: Continuous Enteral Nutrition Schedule: Jevity continous pump Gastrointestinal Self-Management Outcome: 4 (good)    Genitourinary Genitourinary Symptoms Reported: No symptoms reported    Integumentary Integumentary Symptoms Reported: Skin changes, Incision, Other Other Integumentary Symptoms: s/p surgical removal of basal cell skin cancer from right mandible, incision is closed and scarring is present Skin Management Strategies: Routine screening Skin Self-Management Outcome: 4 (good)  Musculoskeletal Musculoskelatal Symptoms Reviewed: Limited mobility, Weakness, Other Other Musculoskeletal Symptoms: limited mobility due to deconditioning; right arm weakness secondary to injury from past radiation treatments Musculoskeletal Management Strategies: Routine screening Musculoskeletal Self-Management Outcome: 3 (uncertain) Musculoskeletal Comment: Educated wife re: benefits of PT to help strengthen and improve ROM to right arm Falls in the past year?: No Number of falls in past year: 1 or less Was there an injury with Fall?: No Fall Risk Category Calculator: 0 Patient Fall Risk Level: Low Fall Risk    Psychosocial Psychosocial Symptoms Reported: No symptoms reported     Quality of Family Relationships: involved, supportive, helpful    07/29/2024    PHQ2-9 Depression Screening   Lee Barnes interest or pleasure in doing things    Feeling down, depressed, or hopeless    PHQ-2 - Total Score    Trouble falling or staying asleep, or sleeping too much    Feeling tired or having Lee Barnes energy    Poor appetite or overeating     Feeling bad about yourself - or that you are a failure or have let yourself or your family down    Trouble  concentrating on things, such as reading the  newspaper or watching television    Moving or speaking so slowly that other people could have noticed.  Or the opposite - being so fidgety or restless that you have been moving around a lot more than usual    Thoughts that you would be better off dead, or hurting yourself in some way    PHQ2-9 Total Score    If you checked off any problems, how difficult have these problems made it for you to do your work, take care of things at home, or get along with other people    Depression Interventions/Treatment      There were no vitals filed for this visit.  Medications Reviewed Today     Reviewed by Lee Lee CROME, RN (Registered Nurse) on 07/29/24 at [phone]  Med List Status: <None>   Medication Order Taking? Sig Documenting Provider Last Dose Status Informant  acetaminophen  (TYLENOL ) 325 MG tablet [id number] No Place 2 tablets (650 mg total) into feeding tube every 6 (six) hours as needed for mild pain (or Fever >/= 101).  Patient not taking: Reported on 04/09/2024   Lee Lee MATSU, MD Not Taking Active            Med Note [name] LUCIENNE FALCON   Tue Jan 27, 2024  2:26 PM) As needed  albuterol  (PROVENTIL ) (2.5 MG/3ML) 0.083% nebulizer solution [id number] No USE 3 MILLILITERS VIA NEBULIZATION BY MOUTH EVERY 4 HOURS AS NEEDED FOR WHEEZING Lee Norleen BROCKS, MD Taking Active   cetirizine (ZYRTEC) 10 MG chewable tablet [id number] No Place 10 mg into feeding tube daily as needed for allergies or rhinitis. [provider] Taking Active Self, Pharmacy Records  diphenhydrAMINE  (BENADRYL ) 25 MG tablet [id number] No Take 12.5 mg by mouth at bedtime as needed for allergies (to dry secretions). [provider] Taking Active Self, Pharmacy Records           Med Note [name], [name]   Tue Jan 27, 2024  2:26 PM) 1/2 tab nightly  ferrous sulfate  300 (60 Fe) MG/5ML syrup [id number]  Take 5 mLs (300 mg total) by mouth daily.  Patient not taking: Reported on 05/17/2024   Lee Owen LABOR, MD   Expired 04/09/24 2359 Self, Pharmacy Records           Med Note JANEAN JON DEL   Thu Apr 10, 2023  1:33 PM) Crushing a 325mg  tablet daily and giving via tube  fluconazole  (DIFLUCAN ) 150 MG tablet [id number]  Crush and take one tablet per J tube today, repeat in 3 days. Lee Barnes, Lee E, NP  Active   fluticasone  (FLONASE ) 50 MCG/ACT nasal spray [id number] No SPRAY TWO SPRAYS IN EACH NOSTRIL ONCE DAILY Lee Norleen BROCKS, MD Taking Active   guaiFENesin  [facility]) 100 MG/5ML liquid [id number]  Place 30 mLs into feeding tube 2 (two) times daily. Lee Barnes, Lee E, NP  Active   hydrALAZINE  (APRESOLINE ) 25 MG tablet [id number] No Place 1 tablet (25 mg total) into feeding tube every 8 (eight) hours as needed (for SBP greater than 175).  Patient not taking: Reported on 04/09/2024   Lee Lee MATSU, MD Not Taking Active            Med Note Lee Barnes, Lee Barnes   Fri Apr 09, 2024  1:28 PM) As needed if BP is elevated   midodrine  (PROAMATINE ) 5 MG tablet [id number] No Take 5 mg by mouth 3 (three) times daily with meals as needed (low BP). [provider] Taking Active Self           Med Note [name], LUCIENNE FALCON   Tue Jan 27, 2024  2:28 PM) As needed  Nutritional Supplements (FEEDING SUPPLEMENT, OSMOLITE 1.5 CAL,) LIQD [id number] No Place 1,425 mLs into feeding tube daily. Lee Elgin LABOR, MD Taking Active Self, Pharmacy Records           Med Note [name] LUCIENNE FALCON Lee Barnes 29, 2024 11:11 AM) And protein supplement  nystatin  (MYCOSTATIN ) 100000 UNIT/ML suspension [id number]  Take 5 mLs (500,000 Units total) by mouth 4 (four) times daily. X 1 week. Swish and hold in mouth for at least 2 minutes and then spit  Patient not taking: Reported on 05/17/2024   Oris Camie BRAVO, NP  Active   ondansetron  (ZOFRAN ) 4 MG/5ML solution [id number] No Place 4 mg into feeding tube every 8 (eight) hours as needed for nausea or vomiting. [provider] Taking Active Self  pantoprazole  sodium (PROTONIX ) 40 mg [id number]   Place 40 mg into feeding tube daily.  Patient not taking: Reported on 05/17/2024   Lee Barnes, Lee E, NP  Active   sodium chloride  (OCEAN) 0.65 % SOLN nasal spray [id number] No Place 1 spray into both nostrils as needed for congestion.  Patient not taking: Reported on 01/27/2024   Lee Owen LABOR, MD Not Taking Active Self, Pharmacy Records  sodium chloride  HYPERTONIC 3 % nebulizer solution [id number] No Take 4 mLs by nebulization daily. Lee Owen LABOR, MD Taking Active Self, Pharmacy Records  Med List Note Isabel Doneta GORMAN Bishop 06/26/22 9755): Extra fluids at 1000 - Gatorade or Pedialyte            Recommendation:   PCP Follow-up for evaluation of PT for right arm weakness  Continue Current Plan of Care  Follow Up Plan:   Telephone follow up appointment date/time:  Thursday, October 9 at 09:30 AM  Lee Ly RN BSN CCM [place]  [facility], [facility] Nurse Care Coordinator  Direct Dial: [phone] Website: [email or web address]

## 2024-07-29 NOTE — Telephone Encounter (Signed)
 Pt scheduled for 08-11-24

## 2024-07-31 ENCOUNTER — Other Ambulatory Visit: Payer: Self-pay | Admitting: Radiology

## 2024-07-31 ENCOUNTER — Telehealth: Payer: Self-pay | Admitting: Radiology

## 2024-07-31 ENCOUNTER — Ambulatory Visit (HOSPITAL_COMMUNITY)
Admission: RE | Admit: 2024-07-31 | Discharge: 2024-07-31 | Disposition: A | Discharge status: Home / Self Care | Source: Ambulatory Visit | Attending: Radiology | Admitting: Radiology

## 2024-07-31 ENCOUNTER — Ambulatory Visit: Payer: Self-pay | Admitting: Radiology

## 2024-07-31 DIAGNOSIS — Z431 Encounter for attention to gastrostomy: Secondary | ICD-10-CM | POA: Insufficient documentation

## 2024-07-31 DIAGNOSIS — K9419 Other complications of enterostomy: Secondary | ICD-10-CM | POA: Diagnosis not present

## 2024-07-31 DIAGNOSIS — R1312 Dysphagia, oropharyngeal phase: Secondary | ICD-10-CM

## 2024-07-31 DIAGNOSIS — J690 Pneumonitis due to inhalation of food and vomit: Secondary | ICD-10-CM

## 2024-07-31 HISTORY — PX: IR REPLACE G-TUBE SIMPLE WO FLUORO: IMG2323

## 2024-07-31 NOTE — Telephone Encounter (Signed)
 Brief Interventional Radiology Note:  Received message from [facility] stating that patient's wife had called with concerns of GJ tube being dislodged this morning. Spoke to wife and patient on the phone who reports his tube was pulled as he was getting out of the shower this morning. States that he immediately replaced the tube back through the insertion site, but given tube is supposed to be post-pyloric patient is inquiring about exchange today.   Discussed with Dr. JONETTA Faes who recommended bedside replacement with g-tube for the interim, until patient can be seen for GJ tube replacement.  Patient subsequently presented to the IR department for bedside exchange. Currently with a 22Fr tube in place. Gentle traction was applied to the current GJ tube which was easily removed. A new 22Fr balloon-retention gastrostomy tube was placed into the insertion site with immediate return of gastric contents. The balloon was inflated with ~66mL of saline and the bumper was cinched down to the skin with a thin layer of guaze placed between. Patient tolerated procedure well with no complaints.   Discussed with patient that if he does not hear from our schedulers by end of day Monday to schedule appointment for GJ tube replacement to give our office a call. Patient verbalized understanding. All questions and concerns answered at the bedside.   Electronically Signed: Camrynn Mcclintic M Keriana Sarsfield, PA-C 07/31/2024, 2:24 PM

## 2024-08-02 ENCOUNTER — Other Ambulatory Visit: Payer: Self-pay | Admitting: Radiology

## 2024-08-02 ENCOUNTER — Ambulatory Visit (HOSPITAL_COMMUNITY)
Admission: RE | Admit: 2024-08-02 | Discharge: 2024-08-02 | Disposition: A | Discharge status: Home / Self Care | Source: Ambulatory Visit | Attending: Radiology | Admitting: Radiology

## 2024-08-02 DIAGNOSIS — K9429 Other complications of gastrostomy: Secondary | ICD-10-CM | POA: Diagnosis not present

## 2024-08-02 DIAGNOSIS — Z431 Encounter for attention to gastrostomy: Secondary | ICD-10-CM | POA: Insufficient documentation

## 2024-08-02 DIAGNOSIS — J690 Pneumonitis due to inhalation of food and vomit: Secondary | ICD-10-CM

## 2024-08-02 DIAGNOSIS — R1312 Dysphagia, oropharyngeal phase: Secondary | ICD-10-CM

## 2024-08-02 HISTORY — PX: IR GASTR TUBE CONVERT GASTR-JEJ PER W/FL MOD SED: IMG2332

## 2024-08-02 MED ORDER — LIDOCAINE HCL URETHRAL/MUCOSAL 2 % EX GEL: 1.0000 | Freq: Once | CUTANEOUS | Status: DC

## 2024-08-02 MED ORDER — LIDOCAINE VISCOUS HCL 2 % MT SOLN
OROMUCOSAL | Status: AC
  Filled 2024-08-02: qty 15

## 2024-08-02 MED ORDER — IOHEXOL 300 MG/ML  SOLN
50.0000 mL | Freq: Once | INTRAMUSCULAR | Status: AC | PRN
  Administered 2024-08-02: 30 mL

## 2024-08-11 ENCOUNTER — Ambulatory Visit (INDEPENDENT_AMBULATORY_CARE_PROVIDER_SITE_OTHER): Admitting: Family Medicine

## 2024-08-11 ENCOUNTER — Encounter: Payer: Self-pay | Admitting: Family Medicine

## 2024-08-11 VITALS — BP 160/94 | HR 84 | Ht 72.0 in | Wt 164.8 lb

## 2024-08-11 DIAGNOSIS — Z23 Encounter for immunization: Secondary | ICD-10-CM | POA: Diagnosis not present

## 2024-08-11 DIAGNOSIS — M25511 Pain in right shoulder: Secondary | ICD-10-CM | POA: Diagnosis not present

## 2024-08-11 DIAGNOSIS — G8929 Other chronic pain: Secondary | ICD-10-CM | POA: Diagnosis not present

## 2024-08-11 NOTE — Progress Notes (Signed)
   Subjective:    Patient ID: Lee Barnes, male    DOB: [birth year], 73 y.o.   MRN: [id number]  Discussed the use of AI scribe software for clinical note transcription with the patient, who gave verbal consent to proceed.  History of Present Illness   Lee Barnes is a 73 year old male with a history of right shoulder dislocation and surgical repair who presents with ongoing weakness and decreased range of motion in the right shoulder.  He has experienced trouble with his right shoulder for several years, beginning at the age of 34, when he had a dislocation that required surgical intervention with the placement of medical screws.  Following the surgery, he experienced stability in the shoulder for several  years. However, after retiring, he noticed progressive weakness in his right arm, which has continued to worsen over time.  He last consulted with Dr Cristy.April 29 who referred him for a nerve conduction test. This test involved electronic stimulation and needle procedures in his arm and neck. The results of the test were not specified to the patient, and he was told that nothing further would be done at that time.  Despite this, he continues to experience a loss of strength in his arm and is seeking options for physical therapy to regain strength and improve range of motion in his shoulder.   He states that apparently they did not offer any further therapy or intervention.       Review of Systems     Objective:    Physical Exam Alert and in no distress otherwise not examined             Assessment & Plan:  Assessment and Plan    Chronic right shoulder pain and weakness, status post prior dislocation and surgery Symptoms persistent for years with increasing weakness. Previous provider did not recommend further intervention. He desires physical therapy. - Obtain and review Dr. Rodman note regarding recent evaluation and recommendations. - Consider  referral to [facility] for physical therapy pending review of Dr. Rodman note. - Discuss potential for reverse shoulder surgery if indicated after review of previous evaluations.      Flu shot given.  Also recommend he go to the drugstore to get his final Shingrix, COVID, RSV and Tdap.

## 2024-08-12 ENCOUNTER — Encounter (HOSPITAL_COMMUNITY): Payer: Self-pay

## 2024-08-12 DIAGNOSIS — D225 Melanocytic nevi of trunk: Secondary | ICD-10-CM | POA: Diagnosis not present

## 2024-08-12 DIAGNOSIS — Z85828 Personal history of other malignant neoplasm of skin: Secondary | ICD-10-CM | POA: Diagnosis not present

## 2024-08-12 DIAGNOSIS — L821 Other seborrheic keratosis: Secondary | ICD-10-CM | POA: Diagnosis not present

## 2024-08-12 DIAGNOSIS — D0462 Carcinoma in situ of skin of left upper limb, including shoulder: Secondary | ICD-10-CM | POA: Diagnosis not present

## 2024-08-12 DIAGNOSIS — D485 Neoplasm of uncertain behavior of skin: Secondary | ICD-10-CM | POA: Diagnosis not present

## 2024-08-12 DIAGNOSIS — L820 Inflamed seborrheic keratosis: Secondary | ICD-10-CM | POA: Diagnosis not present

## 2024-08-12 DIAGNOSIS — L814 Other melanin hyperpigmentation: Secondary | ICD-10-CM | POA: Diagnosis not present

## 2024-08-12 DIAGNOSIS — L570 Actinic keratosis: Secondary | ICD-10-CM | POA: Diagnosis not present

## 2024-08-13 ENCOUNTER — Other Ambulatory Visit: Payer: Self-pay | Admitting: Licensed Clinical Social Worker

## 2024-08-18 DIAGNOSIS — D099 Carcinoma in situ, unspecified: Secondary | ICD-10-CM | POA: Insufficient documentation

## 2024-08-20 NOTE — Patient Outreach (Signed)
 Complex Care Management   Visit Note  08/13/2024  Name:  Lee Barnes MRN: [id number] DOB: March 09, 1951  Situation: Referral received for Complex Care Management related to Caregiver Stress I obtained verbal consent from Caregiver.  Visit completed with Caregiver  on the phone  Background:   Past Medical History:  Diagnosis Date   Acute aspiration pneumonia (HCC) 11/13/2022   Allergy to environmental factors    Basal cell carcinoma of left cheek 07/17/2022   Complication of anesthesia    vageled after surgery -overnite stay   Diverticulosis 2008   Heart murmur    hx of in childhood    History of chemotherapy    History of radiation therapy 11/05/10-12/26/10   r base tongue, 7000 cGy 35 sessions   Hypertension    Medication started in May 2016.   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   Tinnitus     Assessment: Patient Reported Symptoms:  Cognitive Cognitive Status: No symptoms reported      Neurological Neurological Review of Symptoms: Not assessed    HEENT HEENT Symptoms Reported: Not assessed      Cardiovascular Cardiovascular Symptoms Reported: Not assessed    Respiratory Respiratory Symptoms Reported: Not assesed    Endocrine Endocrine Symptoms Reported: Not assessed    Gastrointestinal Gastrointestinal Symptoms Reported: Not assessed      Genitourinary Genitourinary Symptoms Reported: Not assessed    Integumentary Integumentary Symptoms Reported: Not assessed    Musculoskeletal Musculoskelatal Symptoms Reviewed: Not assessed        Psychosocial Psychosocial Symptoms Reported: No symptoms reported          08/20/2024    PHQ2-9 Depression Screening   Little interest or pleasure in doing things    Feeling down, depressed, or hopeless    PHQ-2 - Total Score    Trouble falling or staying asleep, or sleeping too much    Feeling tired or having little energy    Poor appetite  or overeating     Feeling bad about yourself - or that you are a failure or have let yourself or your family down    Trouble concentrating on things, such as reading the newspaper or watching television    Moving or speaking so slowly that other people could have noticed.  Or the opposite - being so fidgety or restless that you have been moving around a lot more than usual    Thoughts that you would be better off dead, or hurting yourself in some way    PHQ2-9 Total Score    If you checked off any problems, how difficult have these problems made it for you to do your work, take care of things at home, or get along with other people    Depression Interventions/Treatment      There were no vitals filed for this visit.  Medications Reviewed Today     Reviewed by Ezzard Rolin BIRCH, LCSW (Social Worker) on 08/20/24 at 1722  Med List Status: <None>   Medication Order Taking? Sig Documenting Provider Last Dose Status Informant  acetaminophen  (TYLENOL ) 325 MG tablet [id number]  Place 2 tablets (650 mg total) into feeding tube every 6 (six) hours as needed for mild pain (or Fever >/= 101). Will [name], MD  Active            Med Note [name], LUCIENNE FALCON   Tue Jan 27, 2024  2:26 PM) As needed  albuterol  (PROVENTIL ) (2.5 MG/3ML) 0.083% nebulizer solution [id number]  USE 3 MILLILITERS VIA NEBULIZATION BY MOUTH EVERY 4 HOURS AS NEEDED FOR WHEEZING [name], [name], MD  Active   cetirizine (ZYRTEC) 10 MG chewable tablet [id number]  Place 10 mg into feeding tube daily as needed for allergies or rhinitis. [provider]  Active Self, Pharmacy Records  diphenhydrAMINE  (BENADRYL ) 25 MG tablet [id number] No Take 12.5 mg by mouth at bedtime as needed for allergies (to dry secretions). [provider] Taking Active Self, Pharmacy Records           Med Note [name], [name]   Tue Jan 27, 2024  2:26 PM) 1/2 tab nightly  ferrous sulfate  300 (60 Fe) MG/5ML syrup [id number]  Take 5 mLs (300 mg  total) by mouth daily.  Patient not taking: Reported on 08/11/2024   Madelyne Owen LABOR, MD  Expired 04/09/24 2359 Self, Pharmacy Records           Med Note JANEAN JON DEL   Thu Apr 10, 2023  1:33 PM) Crushing a 325mg  tablet daily and giving via tube  fluconazole  (DIFLUCAN ) 150 MG tablet [id number]  Crush and take one tablet per J tube today, repeat in 3 days.  Patient not taking: Reported on 08/11/2024   Early, Sara E, NP  Active   fluticasone  (FLONASE ) 50 MCG/ACT nasal spray [id number]  SPRAY TWO SPRAYS IN EACH NOSTRIL ONCE DAILY Joyce Norleen BROCKS, MD  Active   guaiFENesin  (ROBITUSSIN) 100 MG/5ML liquid [id number]  Place 30 mLs into feeding tube 2 (two) times daily. Early, Sara E, NP  Active   hydrALAZINE  (APRESOLINE ) 25 MG tablet [id number]  Place 1 tablet (25 mg total) into feeding tube every 8 (eight) hours as needed (for SBP greater than 175). Will [name], MD  Active            Med Note LEOPOLDO, CLAYBORNE CROME   Fri Apr 09, 2024  1:28 PM) As needed if BP is elevated   midodrine  (PROAMATINE ) 5 MG tablet [id number]  Take 5 mg by mouth 3 (three) times daily with meals as needed (low BP). [provider]  Active Self           Med Note [name], [name]   Tue Jan 27, 2024  2:28 PM) As needed  Nutritional Supplements (FEEDING SUPPLEMENT, OSMOLITE 1.5 CAL,) LIQD [id number]  Place 1,425 mLs into feeding tube daily. Briana Elgin LABOR, MD  Active Self, Pharmacy Records           Med Note [name], LUCIENNE JULIANNA Schaumann Jan 16, 2023 11:11 AM) And protein supplement  nystatin  (MYCOSTATIN ) 100000 UNIT/ML suspension [id number]  Take 5 mLs (500,000 Units total) by mouth 4 (four) times daily. X 1 week. Swish and hold in mouth for at least 2 minutes and then spit  Patient not taking: Reported on 08/11/2024   Early, Sara E, NP  Active   ondansetron  (ZOFRAN ) 4 MG/5ML solution [id number]  Place 4 mg into feeding tube every 8 (eight) hours as needed for nausea or vomiting. [provider]  Active Self   pantoprazole  sodium (PROTONIX ) 40 mg [id number]  Place 40 mg into feeding tube daily. Early, Sara E, NP  Active   sodium chloride  (OCEAN) 0.65 % SOLN nasal spray [id number]  Place 1 spray into both nostrils as needed for congestion. Regalado, Belkys A, MD  Active Self, Pharmacy Records  sodium chloride  HYPERTONIC 3 % nebulizer solution [id number]  Take 4 mLs by nebulization daily. Regalado, Belkys A, MD  Active Self, Pharmacy Records  Med List Note Isabel Doneta RAMAN, [place] 06/26/22 9755): Extra fluids at 1000 - Gatorade or Pedialyte            Recommendation:   Continue Current Plan of Care  Follow Up Plan:   Patient has met all care management goals. Care Management case will be closed. Patient has been provided contact information should new needs arise.   Rolin Ezzard HUGHS [facility]  [facility], [facility] Clinical Social Worker Direct Dial: [phone]  Fax: [phone] Website: [email or web address] 5:26 PM

## 2024-08-20 NOTE — Patient Instructions (Signed)
 Visit Information  Thank you for taking time to visit with me today. Please don't hesitate to contact me if I can be of assistance to you before our next scheduled appointment.   Patient has met all care management goals. Care Management case will be closed. Patient has been provided contact information should new needs arise.   Please call the care guide team at [phone] if you need to cancel, schedule, or reschedule an appointment.   Please call the Suicide and Crisis Lifeline: 988 go to [facility] [address], [place] [phone]) call 911 if you are experiencing a Mental Health or Behavioral Health Crisis or need someone to talk to.  Rolin Kerns, LCSW [place]  [facility], [facility] Clinical Social Worker Direct Dial: [phone]  Fax: [phone] Website: [email or web address] 5:27 PM

## 2024-08-26 ENCOUNTER — Other Ambulatory Visit: Payer: Self-pay

## 2024-08-26 NOTE — Patient Outreach (Signed)
 Complex Care Management   Visit Note  08/26/2024  Name:  Lee Barnes MRN: [id number] DOB: [birth year]  Situation: Referral received for Complex Care Management related to  Hypertension; Neurogenic orthostatic hypotension; dysphagia with tube feeding; impaired gait/limited mobility with leg and arm weakness. I obtained verbal consent from Patient.  Visit completed with Patient on the phone.  Background:   Past Medical History:  Diagnosis Date   Acute aspiration pneumonia (HCC) 11/13/2022   Allergy to environmental factors    Basal cell carcinoma of left cheek 07/17/2022   Complication of anesthesia    vageled after surgery -overnite stay   Diverticulosis 2008   Heart murmur    hx of in childhood    History of chemotherapy    History of radiation therapy 11/05/10-12/26/10   r base tongue, 7000 cGy 35 sessions   Hypertension    Medication started in May 2016.   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   Tinnitus     Assessment: Patient Reported Symptoms:  Cognitive Cognitive Status: No symptoms reported (completed call with [name]) Cognitive/Intellectual Conditions Management [RPT]: None reported or documented in medical history or problem list   Health Maintenance Behaviors: Annual physical exam, Healthy diet, Immunizations, Sleep adequate Health Facilitated by: Rest, Healthy diet  Neurological Neurological Review of Symptoms: Weakness (weakness to right shoulder/arm) Neurological Management Strategies: Routine screening Neurological Self-Management Outcome: 3 (uncertain)  HEENT HEENT Symptoms Reported: No symptoms reported      Cardiovascular Cardiovascular Symptoms Reported: No symptoms reported Does patient have uncontrolled Hypertension?: No Cardiovascular Management Strategies: Routine screening, Medication therapy Cardiovascular Self-Management Outcome: 4 (good) Cardiovascular Comment:  discussed recent elevated BP with [name], she states patient usaully runs a low BP at home on most days  Respiratory Respiratory Symptoms Reported: No symptoms reported    Endocrine Endocrine Symptoms Reported: No symptoms reported Is patient diabetic?: No    Gastrointestinal Gastrointestinal Symptoms Reported: Change in appetite Additional Gastrointestinal Details: G-tube feedings; History of oropharyngeal cancer Gastrointestinal Management Strategies: Nutrition support Enteral Nutrition Time Frame: Continuous Enteral Nutrition Schedule: Jevity Parenteral Nutrition Method of Administration: Continuous Gastrointestinal Self-Management Outcome: 4 (good)    Genitourinary Genitourinary Symptoms Reported: No symptoms reported    Integumentary Integumentary Symptoms Reported: Itching, Skin changes Additional Integumentary Details: skin changes related to past radiation treatments Skin Management Strategies: Routine screening, Medication therapy Skin Self-Management Outcome: 3 (uncertain)  Musculoskeletal Musculoskelatal Symptoms Reviewed: Weakness Additional Musculoskeletal Details: deconditioning from inactivity Musculoskeletal Management Strategies: Adequate rest, Routine screening Musculoskeletal Self-Management Outcome: 3 (uncertain) Falls in the past year?: No Number of falls in past year: 1 or less Was there an injury with Fall?: No Fall Risk Category Calculator: 0 Patient Fall Risk Level: Low Fall Risk Patient at Risk for Falls Due to: No Fall Risks Fall risk Follow up: Falls evaluation completed  Psychosocial Psychosocial Symptoms Reported: No symptoms reported   Major Change/Loss/Stressor/Fears (CP): Medical condition, self Techniques to Cope with Loss/Stress/Change: Support group (support of family) Quality of Family Relationships: helpful, involved, supportive Do you feel physically threatened by others?: No    08/26/2024    PHQ2-9 Depression Screening   Lee Barnes  interest or pleasure in doing things    Feeling down, depressed, or hopeless    PHQ-2 - Total Score    Trouble falling or staying asleep, or sleeping too much    Feeling tired or having [name] energy  Poor appetite or overeating     Feeling bad about yourself - or that you are a failure or have let yourself or your family down    Trouble concentrating on things, such as reading the newspaper or watching television    Moving or speaking so slowly that other people could have noticed.  Or the opposite - being so fidgety or restless that you have been moving around a lot more than usual    Thoughts that you would be better off dead, or hurting yourself in some way    PHQ2-9 Total Score    If you checked off any problems, how difficult have these problems made it for you to do your work, take care of things at home, or get along with other people    Depression Interventions/Treatment      There were no vitals filed for this visit.  Medications Reviewed Today     Reviewed by Morgan Clayborne CROME, RN (Registered Nurse) on 08/26/24 at 1022  Med List Status: <None>   Medication Order Taking? Sig Documenting Provider Last Dose Status Informant  acetaminophen  (TYLENOL ) 325 MG tablet [id number]  Place 2 tablets (650 mg total) into feeding tube every 6 (six) hours as needed for mild pain (or Fever >/= 101). Will [name], MD  Active            Med Note [name], [name]   Tue Jan 27, 2024  2:26 PM) As needed  albuterol  (PROVENTIL ) (2.5 MG/3ML) 0.083% nebulizer solution [id number]  USE 3 MILLILITERS VIA NEBULIZATION BY MOUTH EVERY 4 HOURS AS NEEDED FOR WHEEZING [name], [name], MD  Active   cetirizine (ZYRTEC) 10 MG chewable tablet [id number]  Place 10 mg into feeding tube daily as needed for allergies or rhinitis. [provider]  Active Self, Pharmacy Records  diphenhydrAMINE  (BENADRYL ) 25 MG tablet [id number] No Take 12.5 mg by mouth at bedtime as needed for allergies (to dry secretions).  [provider] Taking Active Self, Pharmacy Records           Med Note [name], [name]   Tue Jan 27, 2024  2:26 PM) 1/2 tab nightly  ferrous sulfate  300 (60 Fe) MG/5ML syrup [id number]  Take 5 mLs (300 mg total) by mouth daily.  Patient not taking: Reported on 08/11/2024   Madelyne Owen LABOR, MD  Expired 04/09/24 2359 Self, Pharmacy Records           Med Note JANEAN JON DEL   Thu Apr 10, 2023  1:33 PM) Crushing a 325mg  tablet daily and giving via tube  fluconazole  (DIFLUCAN ) 150 MG tablet [id number]  Crush and take one tablet per J tube today, repeat in 3 days.  Patient not taking: Reported on 08/11/2024   Early, Sara E, NP  Active   fluticasone  (FLONASE ) 50 MCG/ACT nasal spray [id number]  SPRAY TWO SPRAYS IN EACH NOSTRIL ONCE DAILY Joyce Norleen BROCKS, MD  Active   guaiFENesin  (ROBITUSSIN) 100 MG/5ML liquid [id number]  Place 30 mLs into feeding tube 2 (two) times daily. Early, Sara E, NP  Active   hydrALAZINE  (APRESOLINE ) 25 MG tablet [id number]  Place 1 tablet (25 mg total) into feeding tube every 8 (eight) hours as needed (for SBP greater than 175). Will [name], MD  Active            Med Note (Dantre Yearwood L   Fri Apr 09, 2024  1:28 PM) As needed if BP is elevated   midodrine  (PROAMATINE ) 5 MG  tablet [id number]  Take 5 mg by mouth 3 (three) times daily with meals as needed (low BP). [provider]  Active Self           Med Note [name], [name]   Tue Jan 27, 2024  2:28 PM) As needed  Nutritional Supplements (FEEDING SUPPLEMENT, OSMOLITE 1.5 CAL,) LIQD [id number]  Place 1,425 mLs into feeding tube daily. Briana Elgin LABOR, MD  Active Self, Pharmacy Records           Med Note [name], LUCIENNE JULIANNA Schaumann Jan 16, 2023 11:11 AM) And protein supplement  nystatin  (MYCOSTATIN ) 100000 UNIT/ML suspension [id number]  Take 5 mLs (500,000 Units total) by mouth 4 (four) times daily. X 1 week. Swish and hold in mouth for at least 2 minutes and then spit  Patient not taking: Reported  on 08/11/2024   Early, Sara E, NP  Active   ondansetron  (ZOFRAN ) 4 MG/5ML solution [id number]  Place 4 mg into feeding tube every 8 (eight) hours as needed for nausea or vomiting. [provider]  Active Self  pantoprazole  sodium (PROTONIX ) 40 mg [id number]  Place 40 mg into feeding tube daily. Early, Sara E, NP  Active   sodium chloride  (OCEAN) 0.65 % SOLN nasal spray [id number]  Place 1 spray into both nostrils as needed for congestion. Regalado, Belkys A, MD  Active Self, Pharmacy Records  sodium chloride  HYPERTONIC 3 % nebulizer solution [id number]  Take 4 mLs by nebulization daily. Madelyne Owen LABOR, MD  Active Self, Pharmacy Records  Med List Note Isabel Doneta GORMAN [place] 06/26/22 9755): Extra fluids at 1000 - Gatorade or Pedialyte            Recommendation:   PCP Follow-up Continue Current Plan of Care  Follow Up Plan:   Telephone follow up appointment date/time:  Wednesday, October 29 at 09:30 AM  Clayborne Ly RN BSN CCM [place]  [facility], [facility] Health Nurse Care Coordinator  Direct Dial: [phone] Website: [email or web address]

## 2024-08-26 NOTE — Patient Instructions (Addendum)
 Visit Information  Thank you for taking time to visit with me today. Please don't hesitate to contact me if I can be of assistance to you before our next scheduled appointment.  Your next care management appointment is by telephone on Wednesday, October 29 at 09:30 AM   Please call the care guide team at [phone] if you need to cancel, schedule, or reschedule an appointment.   Please call 1-800-273-TALK (toll free, 24 hour hotline) if you are experiencing a Mental Health or Behavioral Health Crisis or need someone to talk to.  Clayborne Ly RN BSN CCM [place]  [facility], [facility] Health Nurse Care Coordinator  Direct Dial: [phone] Website: [email or web address]

## 2024-09-01 ENCOUNTER — Telehealth: Payer: Self-pay

## 2024-09-01 NOTE — Telephone Encounter (Signed)
 Faxed Referral to [phone] to [facility] PT

## 2024-09-01 NOTE — Telephone Encounter (Signed)
-----   Message from Norleen Jobs sent at 09/01/2024  8:22 AM EDT ----- Regarding: RE: Re: patient requesting a referral Fix it ----- Message ----- From: Morgan Clayborne CROME, RN Sent: 08/31/2024   3:34 PM EDT To: Norleen JAYSON Jobs, MD Subject: Re: patient requesting a referral              Hello Dr. Jobs,   Mr. Ottaviano would like to work with a PT in [facility] near his home to help with strengthening and improving the ROM of his right shoulder. Can you please fax the referral to [facility] in [place] to the following fax # [phone]?    Address:  [address] [place], [place] [address] Phone: [phone]  [place],  Clayborne Morgan RN BSN CCM [place]  [facility], [facility] Health Nurse Care Coordinator  Direct Dial: [phone] Website: [email or web address]

## 2024-09-02 ENCOUNTER — Encounter: Payer: Self-pay | Admitting: Family Medicine

## 2024-09-03 ENCOUNTER — Other Ambulatory Visit (HOSPITAL_COMMUNITY)

## 2024-09-10 DIAGNOSIS — M25511 Pain in right shoulder: Secondary | ICD-10-CM | POA: Diagnosis not present

## 2024-09-10 DIAGNOSIS — R531 Weakness: Secondary | ICD-10-CM | POA: Diagnosis not present

## 2024-09-14 DIAGNOSIS — M25511 Pain in right shoulder: Secondary | ICD-10-CM | POA: Diagnosis not present

## 2024-09-14 DIAGNOSIS — R531 Weakness: Secondary | ICD-10-CM | POA: Diagnosis not present

## 2024-09-15 ENCOUNTER — Other Ambulatory Visit: Payer: Self-pay

## 2024-09-15 NOTE — Patient Instructions (Signed)
 Visit Information  Thank you for taking time to visit with me today. Please don't hesitate to contact me if I can be of assistance to you before our next scheduled appointment.  Your next care management appointment is by telephone on Wednesday, November 26 at 09:30 AM  Please call the care guide team at [phone] if you need to cancel, schedule, or reschedule an appointment.   Please call 1-800-273-TALK (toll free, 24 hour hotline) if you are experiencing a Mental Health or Behavioral Health Crisis or need someone to talk to.  Clayborne Ly RN BSN CCM [place]  [facility], [facility] Health Nurse Care Coordinator  Direct Dial: [phone] Website: [email or web address]

## 2024-09-15 NOTE — Patient Outreach (Signed)
 Complex Care Management   Visit Note  09/15/2024  Name:  Lee Barnes MRN: [id number] DOB: 09-Oct-1951  Situation: Referral received for Complex Care Management related to Hypertension; Neurogenic orthostatic hypotension; dysphagia with tube feeding; impaired gait/limited mobility with leg and arm weakness. I obtained verbal consent from Caregiver.  Visit completed with Caregiver/wife [name] on the phone.  Background:   Past Medical History:  Diagnosis Date   Acute aspiration pneumonia (HCC) 11/13/2022   Allergy to environmental factors    Basal cell carcinoma of left cheek 07/17/2022   Complication of anesthesia    vageled after surgery -overnite stay   Diverticulosis 2008   Heart murmur    hx of in childhood    History of chemotherapy    History of radiation therapy 11/05/10-12/26/10   r base tongue, 7000 cGy 35 sessions   Hypertension    Medication started in May 2016.   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   Tinnitus     Assessment: Patient Reported Symptoms:  Cognitive Cognitive Status: No symptoms reported (completed call with wife [name])      Neurological Neurological Review of Symptoms: Weakness Neurological Management Strategies: Adequate rest, Routine screening, Exercise Neurological Self-Management Outcome: 3 (uncertain) Neurological Comment: weakness with decreased ROM to right shoulder, patient has started outpatient PT  HEENT HEENT Symptoms Reported: Not assessed      Cardiovascular Cardiovascular Symptoms Reported: Fatigue Does patient have uncontrolled Hypertension?: No Cardiovascular Management Strategies: Adequate rest, Exercise Cardiovascular Self-Management Outcome: 4 (good) Cardiovascular Comment: patient has started outpatient PT  Respiratory Respiratory Symptoms Reported: No symptoms reported    Endocrine Endocrine Symptoms Reported: No symptoms reported Is patient  diabetic?: No    Gastrointestinal Gastrointestinal Symptoms Reported: Change in appetite Additional Gastrointestinal Details: G-tube feedings; History of oropharyngeal cancer Gastrointestinal Management Strategies: Nutrition support, Diet modification, Fluid modification Enteral Nutrition Time Frame: Continuous Enteral Nutrition Schedule: Jevity/Ensure Parenteral Nutrition Method of Administration: Continuous Gastrointestinal Self-Management Outcome: 4 (good)    Genitourinary Genitourinary Symptoms Reported: No symptoms reported    Integumentary Integumentary Symptoms Reported: Not assessed    Musculoskeletal Musculoskelatal Symptoms Reviewed: Weakness, Joint pain Additional Musculoskeletal Details: weakness to right shoulder w/decreased ROM and deconditioning from inactivity and complex medical conditions Musculoskeletal Management Strategies: Adequate rest, Routine screening, Exercise Musculoskeletal Self-Management Outcome: 3 (uncertain) Musculoskeletal Comment: patient started outpatient PT for right shoulder weakness      Psychosocial Psychosocial Symptoms Reported: Other Other Psychosocial Conditions: lack of motivation due to complex medical issues Behavioral Management Strategies: Support system, Adequate rest Behavioral Health Self-Management Outcome: 3 (uncertain) Major Change/Loss/Stressor/Fears (CP): Medical condition, self Techniques to Cope with Loss/Stress/Change: Support group, Diversional activities (family support) Quality of Family Relationships: helpful, involved, supportive Do you feel physically threatened by others?: No    09/15/2024    PHQ2-9 Depression Screening   Vuong Musa interest or pleasure in doing things    Feeling down, depressed, or hopeless    PHQ-2 - Total Score    Trouble falling or staying asleep, or sleeping too much    Feeling tired or having Eutha Cude energy    Poor appetite or overeating     Feeling bad about yourself - or that you are a  failure or have let yourself or your family down    Trouble concentrating on things, such as reading the newspaper or watching television    Moving or speaking so slowly that  other people could have noticed.  Or the opposite - being so fidgety or restless that you have been moving around a lot more than usual    Thoughts that you would be better off dead, or hurting yourself in some way    PHQ2-9 Total Score    If you checked off any problems, how difficult have these problems made it for you to do your work, take care of things at home, or get along with other people    Depression Interventions/Treatment      There were no vitals filed for this visit.  Medications Reviewed Today     Reviewed by Morgan Clayborne CROME, RN (Registered Nurse) on 09/15/24 at 1005  Med List Status: <None>   Medication Order Taking? Sig Documenting Provider Last Dose Status Informant  acetaminophen  (TYLENOL ) 325 MG tablet [id number]  Place 2 tablets (650 mg total) into feeding tube every 6 (six) hours as needed for mild pain (or Fever >/= 101). Will [name], MD  Active            Med Note [name], DELAWARE F   Tue Jan 27, 2024  2:26 PM) As needed  albuterol  (PROVENTIL ) (2.5 MG/3ML) 0.083% nebulizer solution [id number]  USE 3 MILLILITERS VIA NEBULIZATION BY MOUTH EVERY 4 HOURS AS NEEDED FOR WHEEZING [name], [name], MD  Active   cetirizine (ZYRTEC) 10 MG chewable tablet [id number]  Place 10 mg into feeding tube daily as needed for allergies or rhinitis. [provider]  Active Self, Pharmacy Records  diphenhydrAMINE  (BENADRYL ) 25 MG tablet [id number] No Take 12.5 mg by mouth at bedtime as needed for allergies (to dry secretions). [provider] Taking Active Self, Pharmacy Records           Med Note [name], [name]   Tue Jan 27, 2024  2:26 PM) 1/2 tab nightly  ferrous sulfate  300 (60 Fe) MG/5ML syrup [id number]  Take 5 mLs (300 mg total) by mouth daily.  Patient not taking: Reported on 08/11/2024    Madelyne Owen LABOR, MD  Expired 04/09/24 2359 Self, Pharmacy Records           Med Note JANEAN JON DEL   Thu Apr 10, 2023  1:33 PM) Crushing a 325mg  tablet daily and giving via tube  fluconazole  (DIFLUCAN ) 150 MG tablet [id number]  Crush and take one tablet per J tube today, repeat in 3 days.  Patient not taking: Reported on 08/11/2024   Early, Sara E, NP  Active   fluticasone  (FLONASE ) 50 MCG/ACT nasal spray [id number]  SPRAY TWO SPRAYS IN EACH NOSTRIL ONCE DAILY Joyce Norleen BROCKS, MD  Active   guaiFENesin  (ROBITUSSIN) 100 MG/5ML liquid [id number]  Place 30 mLs into feeding tube 2 (two) times daily. Early, Sara E, NP  Active   hydrALAZINE  (APRESOLINE ) 25 MG tablet [id number]  Place 1 tablet (25 mg total) into feeding tube every 8 (eight) hours as needed (for SBP greater than 175). Will [name], MD  Active            Med Note LEOPOLDO, CLAYBORNE CROME   Fri Apr 09, 2024  1:28 PM) As needed if BP is elevated   midodrine  (PROAMATINE ) 5 MG tablet [id number]  Take 5 mg by mouth 3 (three) times daily with meals as needed (low BP). [provider]  Active Self           Med Note [name], LUCIENNE FALCON   Tue Jan 27, 2024  2:28 PM) As needed  Nutritional Supplements (FEEDING SUPPLEMENT, OSMOLITE 1.5 CAL,) LIQD [id number]  Place 1,425 mLs into feeding tube daily. Briana Elgin LABOR, MD  Active Self, Pharmacy Records           Med Note [name], LUCIENNE JULIANNA Schaumann Jan 16, 2023 11:11 AM) And protein supplement  nystatin  (MYCOSTATIN ) 100000 UNIT/ML suspension [id number]  Take 5 mLs (500,000 Units total) by mouth 4 (four) times daily. X 1 week. Swish and hold in mouth for at least 2 minutes and then spit  Patient not taking: Reported on 08/11/2024   Early, Sara E, NP  Active   ondansetron  (ZOFRAN ) 4 MG/5ML solution [id number]  Place 4 mg into feeding tube every 8 (eight) hours as needed for nausea or vomiting. [provider]  Active Self  pantoprazole  sodium (PROTONIX ) 40 mg [id number]  Place 40 mg into  feeding tube daily. Early, Sara E, NP  Active   sodium chloride  (OCEAN) 0.65 % SOLN nasal spray [id number]  Place 1 spray into both nostrils as needed for congestion. Regalado, Belkys A, MD  Active Self, Pharmacy Records  sodium chloride  HYPERTONIC 3 % nebulizer solution [id number]  Take 4 mLs by nebulization daily. Madelyne Owen LABOR, MD  Active Self, Pharmacy Records  Med List Note Isabel Doneta GORMAN Bishop 06/26/22 9755): Extra fluids at 1000 - Gatorade or Pedialyte            Recommendation:   Continue Current Plan of Care  Follow Up Plan:   Telephone follow up appointment date/time:  Wednesday, November 26 at 09:30 AM  Clayborne Ly RN BSN CCM [place]  [facility], [facility] Nurse Care Coordinator  Direct Dial: [phone] Website: [email or web address]

## 2024-09-17 DIAGNOSIS — R531 Weakness: Secondary | ICD-10-CM | POA: Diagnosis not present

## 2024-09-17 DIAGNOSIS — M25511 Pain in right shoulder: Secondary | ICD-10-CM | POA: Diagnosis not present

## 2024-09-20 DIAGNOSIS — R531 Weakness: Secondary | ICD-10-CM | POA: Diagnosis not present

## 2024-09-20 DIAGNOSIS — M25511 Pain in right shoulder: Secondary | ICD-10-CM | POA: Diagnosis not present

## 2024-09-23 DIAGNOSIS — M25511 Pain in right shoulder: Secondary | ICD-10-CM | POA: Diagnosis not present

## 2024-09-23 DIAGNOSIS — R531 Weakness: Secondary | ICD-10-CM | POA: Diagnosis not present

## 2024-09-28 DIAGNOSIS — M25511 Pain in right shoulder: Secondary | ICD-10-CM | POA: Diagnosis not present

## 2024-09-28 DIAGNOSIS — R531 Weakness: Secondary | ICD-10-CM | POA: Diagnosis not present

## 2024-09-29 ENCOUNTER — Other Ambulatory Visit: Payer: Self-pay | Admitting: Family Medicine

## 2024-09-29 DIAGNOSIS — Z8701 Personal history of pneumonia (recurrent): Secondary | ICD-10-CM

## 2024-09-29 DIAGNOSIS — Z85819 Personal history of malignant neoplasm of unspecified site of lip, oral cavity, and pharynx: Secondary | ICD-10-CM

## 2024-09-29 DIAGNOSIS — R633 Feeding difficulties, unspecified: Secondary | ICD-10-CM | POA: Diagnosis not present

## 2024-09-30 DIAGNOSIS — R531 Weakness: Secondary | ICD-10-CM | POA: Diagnosis not present

## 2024-09-30 DIAGNOSIS — M25511 Pain in right shoulder: Secondary | ICD-10-CM | POA: Diagnosis not present

## 2024-10-04 ENCOUNTER — Encounter: Payer: Self-pay | Admitting: Family Medicine

## 2024-10-04 NOTE — Telephone Encounter (Signed)
 We have a prior authorization team that handles those. We don't do them but I will look into it and see if they have any information.

## 2024-10-05 ENCOUNTER — Other Ambulatory Visit: Payer: Self-pay

## 2024-10-05 DIAGNOSIS — Z85819 Personal history of malignant neoplasm of unspecified site of lip, oral cavity, and pharynx: Secondary | ICD-10-CM

## 2024-10-05 DIAGNOSIS — M25511 Pain in right shoulder: Secondary | ICD-10-CM | POA: Diagnosis not present

## 2024-10-05 DIAGNOSIS — Z8701 Personal history of pneumonia (recurrent): Secondary | ICD-10-CM

## 2024-10-05 DIAGNOSIS — R531 Weakness: Secondary | ICD-10-CM | POA: Diagnosis not present

## 2024-10-05 DIAGNOSIS — J9601 Acute respiratory failure with hypoxia: Secondary | ICD-10-CM

## 2024-10-05 MED ORDER — ALBUTEROL SULFATE (2.5 MG/3ML) 0.083% IN NEBU
2.5000 mg | INHALATION_SOLUTION | RESPIRATORY_TRACT | 1 refills | Status: AC | PRN
Start: 2024-10-05 — End: ?

## 2024-10-05 NOTE — Telephone Encounter (Signed)
 I called the pharmacy to speak with them, I resent due to a incorrect DX code that was placed.

## 2024-10-05 NOTE — Patient Outreach (Signed)
 Received a voice message from wife [name] advising patient needs a refill for his Albuterol . Wife stated she notified his pharmacy but is having difficulty getting the medication refilled. Noted patient's refill was sent to his preferred pharmacy today. Sent a my chart message to patient/wife to advise the refill was sent to his pharmacy today.   Clayborne Ly RN BSN CCM   [facility], [facility] Nurse Care Coordinator  Direct Dial: [phone] Website: [email or web address]

## 2024-10-07 DIAGNOSIS — R531 Weakness: Secondary | ICD-10-CM | POA: Diagnosis not present

## 2024-10-07 DIAGNOSIS — M25511 Pain in right shoulder: Secondary | ICD-10-CM | POA: Diagnosis not present

## 2024-10-13 ENCOUNTER — Ambulatory Visit: Admitting: Family Medicine

## 2024-10-13 ENCOUNTER — Ambulatory Visit: Payer: Self-pay

## 2024-10-13 ENCOUNTER — Other Ambulatory Visit: Payer: Self-pay

## 2024-10-13 ENCOUNTER — Encounter: Payer: Self-pay | Admitting: Family Medicine

## 2024-10-13 VITALS — BP 132/82 | HR 80 | Temp 100.0°F | Ht 72.0 in | Wt 165.2 lb

## 2024-10-13 DIAGNOSIS — Z789 Other specified health status: Secondary | ICD-10-CM

## 2024-10-13 DIAGNOSIS — J301 Allergic rhinitis due to pollen: Secondary | ICD-10-CM

## 2024-10-13 DIAGNOSIS — R062 Wheezing: Secondary | ICD-10-CM

## 2024-10-13 DIAGNOSIS — K219 Gastro-esophageal reflux disease without esophagitis: Secondary | ICD-10-CM | POA: Diagnosis not present

## 2024-10-13 DIAGNOSIS — R1319 Other dysphagia: Secondary | ICD-10-CM

## 2024-10-13 DIAGNOSIS — G903 Multi-system degeneration of the autonomic nervous system: Secondary | ICD-10-CM

## 2024-10-13 LAB — POCT INFLUENZA A/B
Influenza A, POC: NEGATIVE
Influenza B, POC: NEGATIVE

## 2024-10-13 LAB — POC COVID19 BINAXNOW: SARS Coronavirus 2 Ag: NEGATIVE

## 2024-10-13 MED ORDER — AZELASTINE HCL 0.1 % NA SOLN
1.0000 | Freq: Two times a day (BID) | NASAL | 12 refills | Status: AC
Start: 2024-10-13 — End: ?

## 2024-10-13 MED ORDER — PREDNISONE 20 MG PO TABS: 40.0000 mg | ORAL_TABLET | Freq: Every day | ORAL | 0 refills | Status: AC

## 2024-10-13 MED ORDER — AMOXICILLIN-POT CLAVULANATE 875-125 MG PO TABS: 1.0000 | ORAL_TABLET | Freq: Two times a day (BID) | ORAL | 0 refills | Status: DC

## 2024-10-13 NOTE — Telephone Encounter (Signed)
 Appt scheduled

## 2024-10-13 NOTE — Progress Notes (Signed)
 Name: Lee Barnes   Date of Visit: 10/13/24   Date of last visit with me: Visit date not found   CHIEF COMPLAINT:  Chief Complaint  Patient presents with   Vomiting    Had some vomiting today, no blood. Has a feeding tube. Was at daughters house in [place]. Feels sick, weak, wheezing and slight cough.        HPI:  Discussed the use of AI scribe software for clinical note transcription with the patient, who gave verbal consent to proceed.  History of Present Illness   Lee Barnes is a 73 year old male with a feeding tube who presents with nausea, wheezing, and postnasal drip. He is accompanied by Charlies, his caregiver.  He experiences nausea and difficulty clearing reflux after taking medication in the morning, describing a sensation of 'cream in my throat' which tends to make him nauseous. He coughs up greenish material and has a history of aspiration pneumonia. Despite these symptoms, he is not currently feeling nauseous.  He has pronounced weakness and shakiness, feeling generally unwell. Wheezing is particularly noticeable when lying down, accompanied by congestion in his throat rather than his nose. He uses a nebulizer regularly but did not use it today. No nasal congestion but confirms throat congestion and wheezing. He also reports a sensation of raspiness in his throat and occasional blood when clearing his throat.  He uses Flonase  nasal spray every night and takes Zyrtec and Benadryl , which are crushed due to his inability to swallow. He has a history of reflux, which he does not always report, and has been experiencing minor reflux symptoms recently.         OBJECTIVE:       11/11/2023   10:26 AM  Depression screen PHQ 2/9  Decreased Interest 0  Down, Depressed, Hopeless 0  PHQ - 2 Score 0     BP Readings from Last 3 Encounters:  10/13/24 132/82  08/11/24 (!) 160/94  04/09/24 110/88    BP 132/82   Pulse 80   Temp 100 F (37.8 C) (Tympanic)    Ht 6' (1.829 m)   Wt 165 lb 3.2 oz (74.9 kg)   BMI 22.41 kg/m    Physical Exam   CHEST: Wheezing present on auscultation.      Physical Exam Constitutional:      Appearance: Normal appearance.  HENT:     Nose: No congestion or rhinorrhea.     Mouth/Throat:     Pharynx: Posterior oropharyngeal erythema present.     Comments: Mucus secretion with some red blood noted.  Eyes:     General:        Right eye: No discharge.        Left eye: No discharge.  Cardiovascular:     Rate and Rhythm: Normal rate and regular rhythm.  Pulmonary:     Effort: No respiratory distress.     Breath sounds: No stridor. Wheezing (Expiratory wheeze) present.  Neurological:     Mental Status: He is alert.     ASSESSMENT/PLAN:   Assessment & Plan Wheezing  Seasonal allergic rhinitis due to pollen  On tube feeding diet  Gastroesophageal reflux disease, unspecified whether esophagitis present    Assessment and Plan    Wheezing Likely viral-induced airway constriction. No bacterial infection but risk due to aspiration pneumonia history. - Prescribed prednisone . - Advised home nebulizer treatment. - Initiate prednisone  if no nebulizer improvement. - Start antibiotic if no prednisone  improvement by  Friday.  Allergic rhinitis due to pollen Postnasal drip causing throat irritation and cough. Current treatment includes Flonase  and Zyrtec. - Added Astelin  nasal spray to reduce sinus inflammation.  Gastroesophageal reflux disease Reflux symptoms causing throat irritation and nausea.  - Noted sputum with BRB, we will go ahead and re-evaluate next week.          Jeselle Hiser A. Vita MD [facility]

## 2024-10-13 NOTE — Patient Instructions (Signed)
 Visit Information  Thank you for taking time to visit with me today. Please don't hesitate to contact me if I can be of assistance to you before our next scheduled appointment.  Your next care management appointment is by telephone on Friday, December 19 at 09:30 AM  Please call the care guide team at [phone] if you need to cancel, schedule, or reschedule an appointment.   Please call [phone]-TALK (toll free, 24 hour hotline) if you are experiencing a Mental Health or Behavioral Health Crisis or need someone to talk to.  Clayborne Ly RN BSN CCM [place]  [facility], [facility] Nurse Care Coordinator  Direct Dial: [phone] Website: [email or web address]

## 2024-10-13 NOTE — Patient Outreach (Signed)
 Complex Care Management   Visit Note  10/13/2024  Name:  Lee Barnes MRN: [id number] DOB: 08-12-1951  Situation: Referral received for Complex Care Management related to Hypertension; Neurogenic orthostatic hypotension; dysphagia with tube feeding; impaired gait/limited mobility with leg and arm weakness. I obtained verbal consent from Caregiver.  Visit completed with Caregiver/wife [name] on the phone.  Background:   Past Medical History:  Diagnosis Date   Acute aspiration pneumonia (HCC) 11/13/2022   Allergy to environmental factors    Basal cell carcinoma of left cheek 07/17/2022   Complication of anesthesia    vageled after surgery -overnite stay   Diverticulosis 2008   Heart murmur    hx of in childhood    History of chemotherapy    History of radiation therapy 11/05/10-12/26/10   r base tongue, 7000 cGy 35 sessions   Hypertension    Medication started in May 2016.   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   Tinnitus     Assessment: Patient Reported Symptoms:  Cognitive Cognitive Status: No symptoms reported (completed call with wife [name]) Cognitive/Intellectual Conditions Management [RPT]: None reported or documented in medical history or problem list   Health Maintenance Behaviors: Annual physical exam, Healthy diet, Immunizations, Sleep adequate Health Facilitated by: Healthy diet, Rest  Neurological Neurological Review of Symptoms: Weakness Neurological Management Strategies: Adequate rest, Routine screening Neurological Self-Management Outcome: 4 (good) Neurological Comment: patient completed outpatient PT for his right shoulder, wife reports he regained some strength, no change in ROM  HEENT HEENT Symptoms Reported: No symptoms reported      Cardiovascular Cardiovascular Symptoms Reported: No symptoms reported Does patient have uncontrolled Hypertension?: No Is patient checking Blood  Pressure at home?: Yes Cardiovascular Management Strategies: Medication therapy, Adequate rest, Routine screening Cardiovascular Self-Management Outcome: 4 (good)  Respiratory Respiratory Symptoms Reported: No symptoms reported    Endocrine Endocrine Symptoms Reported: No symptoms reported Is patient diabetic?: No    Gastrointestinal Gastrointestinal Symptoms Reported: Change in appetite, Other Other Gastrointestinal Symptoms: s/p oropharyngeal cancer; history of Diverticulosis; Dysphagia; GERD; protein caloric malnutrition; s/p cancer treatment for squamous cell cancer of the tongue, 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 Gastrointestinal Management Strategies: Nutrition support, Diet modification, Fluid modification Enteral Nutrition Time Frame: Continuous Enteral Nutrition Schedule: Jevity/Ensure Parenteral Nutrition Method of Administration: Continuous Gastrointestinal Self-Management Outcome: 4 (good) Gastrointestinal Comment: Osmolite and other feeding supplies ordered through [employer]    Genitourinary Genitourinary Symptoms Reported: Not assessed    Integumentary Integumentary Symptoms Reported: Not assessed    Musculoskeletal Musculoskelatal Symptoms Reviewed: Joint pain, Weakness Other Musculoskeletal Symptoms: limited mobility due to deconditioning; right arm weakness secondary to injury from patst radiation treatments Musculoskeletal Management Strategies: Adequate rest, Routine screening, Exercise Musculoskeletal Self-Management Outcome: 4 (good)      Psychosocial Psychosocial Symptoms Reported: No symptoms reported (completed call with wife [name]) Behavioral Management Strategies: Adequate rest, Support system Behavioral Health Self-Management Outcome: 4 (good) Major Change/Loss/Stressor/Fears (CP): Medical condition, self Techniques to Cope with Loss/Stress/Change: Diversional activities (suppport system) Quality of Family  Relationships: involved, supportive, helpful Do you feel physically threatened by others?: No    10/13/2024    PHQ2-9 Depression Screening   Jahkeem Kurka interest or pleasure in doing things    Feeling down, depressed, or hopeless    PHQ-2 - Total Score    Trouble falling or staying asleep, or sleeping too much  Feeling tired or having Baylin Gamblin energy    Poor appetite or overeating     Feeling bad about yourself - or that you are a failure or have let yourself or your family down    Trouble concentrating on things, such as reading the newspaper or watching television    Moving or speaking so slowly that other people could have noticed.  Or the opposite - being so fidgety or restless that you have been moving around a lot more than usual    Thoughts that you would be better off dead, or hurting yourself in some way    PHQ2-9 Total Score    If you checked off any problems, how difficult have these problems made it for you to do your work, take care of things at home, or get along with other people    Depression Interventions/Treatment      There were no vitals filed for this visit. Pain Scale: Not given for pain  Medications Reviewed Today     Reviewed by Morgan Clayborne CROME, RN (Registered Nurse) on 10/13/24 at [phone]  Med List Status: <None>   Medication Order Taking? Sig Documenting Provider Last Dose Status Informant  acetaminophen  (TYLENOL ) 325 MG tablet [id number]  Place 2 tablets (650 mg total) into feeding tube every 6 (six) hours as needed for mild pain (or Fever >/= 101). Will [name], MD  Active            Med Note [name], [name]   Tue Jan 27, 2024  2:26 PM) As needed  albuterol  (PROVENTIL ) (2.5 MG/3ML) 0.083% nebulizer solution [id number]  Take 3 mLs (2.5 mg total) by nebulization every 4 (four) hours as needed for wheezing or shortness of breath. [name], [name], MD  Active   cetirizine (ZYRTEC) 10 MG chewable tablet [id number]  Place 10 mg into feeding tube daily as needed  for allergies or rhinitis. [provider]  Active Self, Pharmacy Records  diphenhydrAMINE  (BENADRYL ) 25 MG tablet [id number] No Take 12.5 mg by mouth at bedtime as needed for allergies (to dry secretions). [provider] Taking Active Self, Pharmacy Records           Med Note [name], [name]   Tue Jan 27, 2024  2:26 PM) 1/2 tab nightly  ferrous sulfate  300 (60 Fe) MG/5ML syrup [id number]  Take 5 mLs (300 mg total) by mouth daily.  Patient not taking: Reported on 08/11/2024   Madelyne Owen LABOR, MD  Expired 04/09/24 2359 Self, Pharmacy Records           Med Note JANEAN JON DEL   Thu Apr 10, 2023  1:33 PM) Crushing a 325mg  tablet daily and giving via tube  fluconazole  (DIFLUCAN ) 150 MG tablet [id number]  Crush and take one tablet per J tube today, repeat in 3 days.  Patient not taking: Reported on 08/11/2024   Early, Sara E, NP  Active   fluticasone  (FLONASE ) 50 MCG/ACT nasal spray [id number]  SPRAY TWO SPRAYS IN EACH NOSTRIL ONCE DAILY Joyce Norleen BROCKS, MD  Active   guaiFENesin  (ROBITUSSIN) 100 MG/5ML liquid [id number]  Place 30 mLs into feeding tube 2 (two) times daily. Early, Sara E, NP  Active   hydrALAZINE  (APRESOLINE ) 25 MG tablet [id number]  Place 1 tablet (25 mg total) into feeding tube every 8 (eight) hours as needed (for SBP greater than 175). Will [name], MD  Active            Med Note (Conda Wannamaker L  Fri Apr 09, 2024  1:28 PM) As needed if BP is elevated   midodrine  (PROAMATINE ) 5 MG tablet [id number]  Take 5 mg by mouth 3 (three) times daily with meals as needed (low BP). [provider]  Active Self           Med Note [name], [name]   Tue Jan 27, 2024  2:28 PM) As needed  Nutritional Supplements (FEEDING SUPPLEMENT, OSMOLITE 1.5 CAL,) LIQD [id number]  Place 1,425 mLs into feeding tube daily. Briana Elgin LABOR, MD  Active Self, Pharmacy Records           Med Note [name], LUCIENNE JULIANNA Schaumann Jan 16, 2023 11:11 AM) And protein supplement  nystatin   (MYCOSTATIN ) 100000 UNIT/ML suspension [id number]  Take 5 mLs (500,000 Units total) by mouth 4 (four) times daily. X 1 week. Swish and hold in mouth for at least 2 minutes and then spit  Patient not taking: Reported on 08/11/2024   Early, Sara E, NP  Active   ondansetron  (ZOFRAN ) 4 MG/5ML solution [id number]  Place 4 mg into feeding tube every 8 (eight) hours as needed for nausea or vomiting. [provider]  Active Self  pantoprazole  sodium (PROTONIX ) 40 mg [id number]  Place 40 mg into feeding tube daily. Early, Sara E, NP  Active   sodium chloride  (OCEAN) 0.65 % SOLN nasal spray [id number]  Place 1 spray into both nostrils as needed for congestion. Regalado, Belkys A, MD  Active Self, Pharmacy Records  sodium chloride  HYPERTONIC 3 % nebulizer solution [id number]  Take 4 mLs by nebulization daily. Madelyne Owen LABOR, MD  Active Self, Pharmacy Records  Med List Note Isabel, Doneta RAMAN, CPhT 06/26/22 9755): Extra fluids at 1000 - Gatorade or Pedialyte            Recommendation:   PCP Follow-up  11/16/2024 Status: Sch   Time: 10:10 AM Length: 40  Visit Type: MEDICARE AWV, SEQUENTIAL [1973] Copay: $0.00  Provider: [facility]-ANNUAL WELLNESS VISIT Department: [facility]   Follow Up Plan:   Telephone follow up appointment date/time:    11/05/2024 Status: Sch   Time: 9:30 AM Length: 30  Visit Type: VBCI TELEPHONE CALL 30 [2502] Copay: $0.00  Provider: Morgan Clayborne CROME, RN Department: [facility]    Clayborne Morgan RN BSN CCM [place]  [facility], [facility] Nurse Care Coordinator  Direct Dial: [phone] Website: [email or web address]

## 2024-10-13 NOTE — Addendum Note (Signed)
 Addended by: MORGAN CLAYBORNE CROME on: 10/13/2024 11:16 AM   Modules accepted: Orders

## 2024-10-13 NOTE — Telephone Encounter (Signed)
 FYI Only or Action Required?: FYI only for provider: appointment scheduled on 10/13/2024 at 3:30pm with Dr Vita at PCP office.  Patient was last seen in primary care on 08/11/2024 by Joyce Norleen BROCKS, MD.  Called Nurse Triage reporting Vomiting.  Symptoms began today.  Interventions attempted: Nothing.  Symptoms are: unchanged.  Triage Disposition: See Physician Within 24 Hours  Patient/caregiver understands and will follow disposition?: Yes                 Copied from CRM #[id number]. Topic: Clinical - Red Word Triage >> Oct 13, 2024 12:56 PM Charlet HERO wrote: Red Word that prompted transfer to Nurse Triage: Patient is calling stating that he has  vomiting and has feeding tube, weakness. [name]. Reason for Disposition  [1] MILD or MODERATE vomiting AND [2] present > 48 hours (2 days)  (Exception: Mild vomiting with associated diarrhea.)    Given the patient's medical history and the wife wanting to be proactive about the patient's symptoms---appointment is made for today so patient can be evaluated by a provider  Answer Assessment - Initial Assessment Questions Wife thinks patient might have pneumonia Patient has a feeding tube & vomited 30 minutes prior to triage. ---this happens when he gets aspiration pneumonia  Denies fevers, diarrhea, difficulty breathing, issues with feeding tube, or any other symptoms other than some weakness  Wife states they are currently in [place].  She is able to drive straight to the office for an appointment today. She states that she is trying to get ahead of any possible aspiration pneumonia before patient gets too sick and has to go to the hospital.  Given the patient's medical history and the wife wanting to be proactive about the patient's symptoms---appointment is made for today so patient can be evaluated by a provider  Patient is advised to call us  back if anything changes or with any further questions/concerns. Patient is advised that  if anything worsens to go to the Emergency Room or call 911. Patient verbalized understanding.  Protocols used: Vomiting-A-AH

## 2024-10-15 ENCOUNTER — Encounter: Payer: Self-pay | Admitting: Family Medicine

## 2024-10-16 ENCOUNTER — Encounter (HOSPITAL_COMMUNITY): Payer: Self-pay

## 2024-10-16 ENCOUNTER — Other Ambulatory Visit: Payer: Self-pay

## 2024-10-16 ENCOUNTER — Emergency Department (HOSPITAL_COMMUNITY)

## 2024-10-16 ENCOUNTER — Emergency Department (HOSPITAL_COMMUNITY)
Admission: EM | Admit: 2024-10-16 | Discharge: 2024-10-16 | Disposition: A | Discharge status: Home / Self Care | Attending: Emergency Medicine | Admitting: Emergency Medicine

## 2024-10-16 DIAGNOSIS — R9389 Abnormal findings on diagnostic imaging of other specified body structures: Secondary | ICD-10-CM | POA: Diagnosis not present

## 2024-10-16 DIAGNOSIS — R633 Feeding difficulties, unspecified: Secondary | ICD-10-CM | POA: Insufficient documentation

## 2024-10-16 DIAGNOSIS — K9423 Gastrostomy malfunction: Secondary | ICD-10-CM | POA: Insufficient documentation

## 2024-10-16 DIAGNOSIS — Z85818 Personal history of malignant neoplasm of other sites of lip, oral cavity, and pharynx: Secondary | ICD-10-CM | POA: Diagnosis not present

## 2024-10-16 DIAGNOSIS — Z931 Gastrostomy status: Secondary | ICD-10-CM | POA: Diagnosis not present

## 2024-10-16 DIAGNOSIS — Z4682 Encounter for fitting and adjustment of non-vascular catheter: Secondary | ICD-10-CM | POA: Diagnosis not present

## 2024-10-16 MED ORDER — IOHEXOL 300 MG/ML  SOLN
30.0000 mL | Freq: Once | INTRAMUSCULAR | Status: AC | PRN
  Administered 2024-10-16: 30 mL

## 2024-10-16 NOTE — Discharge Instructions (Addendum)
 Please follow-up with the interventional radiologist on Monday.  You can call the on-call number tomorrow to see if you can get in for scheduling on Monday.  Please follow the instructions given to you by nursing for feeding connectivity.  If you have any concerns, new or worsening symptoms, please return your nearest emergency department for reevaluation.

## 2024-10-16 NOTE — ED Triage Notes (Addendum)
 Patient said his feeding tube came out at 6am today. He stuck the tube back in his stomach but he said the bulb collapsed. 22 french.

## 2024-10-16 NOTE — ED Provider Notes (Signed)
 [place] EMERGENCY DEPARTMENT AT [facility] Provider Note   CSN: [id number] Arrival date & time: 10/16/24  9055     Patient presents with: Feeding Tube Dislodged   Lee Barnes is a 73 y.o. male.  {Add pertinent medical, surgical, social history, OB history to HPI:32947} HPI     Prior to Admission medications   Medication Sig Start Date End Date Taking? Authorizing Provider  acetaminophen  (TYLENOL ) 325 MG tablet Place 2 tablets (650 mg total) into feeding tube every 6 (six) hours as needed for mild pain (or Fever >/= 101). 02/05/23   Will [name], MD  albuterol  (PROVENTIL ) (2.5 MG/3ML) 0.083% nebulizer solution Take 3 mLs (2.5 mg total) by nebulization every 4 (four) hours as needed for wheezing or shortness of breath. 10/05/24   Joyce Norleen BROCKS, MD  amoxicillin -clavulanate (AUGMENTIN ) 875-125 MG tablet Take 1 tablet by mouth 2 (two) times daily. 10/13/24   Jha, Panav, MD  azelastine  (ASTELIN ) 0.1 % nasal spray Place 1 spray into both nostrils 2 (two) times daily. Use in each nostril as directed 10/13/24   Jha, Panav, MD  cetirizine (ZYRTEC) 10 MG chewable tablet Place 10 mg into feeding tube daily as needed for allergies or rhinitis.    [provider]  diphenhydrAMINE  (BENADRYL ) 25 MG tablet Take 12.5 mg by mouth at bedtime as needed for allergies (to dry secretions).    [provider]  fluticasone  (FLONASE ) 50 MCG/ACT nasal spray SPRAY TWO SPRAYS IN EACH NOSTRIL ONCE DAILY 08/15/23   Joyce Norleen BROCKS, MD  guaiFENesin  (ROBITUSSIN) 100 MG/5ML liquid Place 30 mLs into feeding tube 2 (two) times daily. 04/09/24   Early, Sara E, NP  hydrALAZINE  (APRESOLINE ) 25 MG tablet Place 1 tablet (25 mg total) into feeding tube every 8 (eight) hours as needed (for SBP greater than 175). 02/05/23   Will [name], MD  midodrine  (PROAMATINE ) 5 MG tablet Take 5 mg by mouth 3 (three) times daily with meals as needed (low BP).    [provider]   Nutritional Supplements (FEEDING SUPPLEMENT, OSMOLITE 1.5 CAL,) LIQD Place 1,425 mLs into feeding tube daily. 10/16/22   Briana Elgin DELENA, MD  nystatin  (MYCOSTATIN ) 100000 UNIT/ML suspension Take 5 mLs (500,000 Units total) by mouth 4 (four) times daily. X 1 week. Swish and hold in mouth for at least 2 minutes and then spit Patient not taking: Reported on 10/13/2024 04/09/24   Early, Camie BRAVO, NP  ondansetron  (ZOFRAN ) 4 MG/5ML solution Place 4 mg into feeding tube every 8 (eight) hours as needed for nausea or vomiting. Patient not taking: Reported on 10/13/2024    [provider]  pantoprazole  sodium (PROTONIX ) 40 mg Place 40 mg into feeding tube daily. Patient not taking: Reported on 10/13/2024 04/09/24   Early, Sara E, NP  predniSONE  (DELTASONE ) 20 MG tablet Take 2 tablets (40 mg total) by mouth daily with breakfast for 5 days. 10/13/24 10/18/24  Jha, Panav, MD  sodium chloride  (OCEAN) 0.65 % SOLN nasal spray Place 1 spray into both nostrils as needed for congestion. 12/30/22   Regalado, Belkys A, MD  sodium chloride  HYPERTONIC 3 % nebulizer solution Take 4 mLs by nebulization daily. 12/31/22   Regalado, Belkys A, MD    Allergies: Codeine    Review of Systems  Updated Vital Signs BP (!) 153/104   Pulse 83   Temp 97.9 F (36.6 C) (Oral)   Resp 17   Ht 6' (1.829 m)   Wt 75 kg  SpO2 97%   BMI 22.42 kg/m   Physical Exam  (all labs ordered are listed, but only abnormal results are displayed) Labs Reviewed - No data to display  EKG: None  Radiology: DG Abdomen 1 View Result Date: 10/16/2024 CLINICAL DATA:  Evaluation gastrostomy tube. EXAM: ABDOMEN - 1 VIEW COMPARISON:  Abdominal film earlier today. FINDINGS: Gastrostomy tube retention balloon within anterior abdominal cavity confirmed to be within the stomach by contrast injection earlier today. No contrast is identified in the peritoneal cavity. No evidence of free intraperitoneal air. IMPRESSION: No injected contrast  material in the peritoneal cavity. No free intraperitoneal air. Electronically Signed   By: Marcey Moan M.D.   On: 10/16/2024 14:43   DG Abdomen PEG Tube Location Result Date: 10/16/2024 CLINICAL DATA:  Enteric tube placement EXAM: ABDOMEN - 1 VIEW COMPARISON:  Abdominal radiograph dated 01/31/2023 FINDINGS: Percutaneous gastrostomy tube projects over the midline upper abdomen at the level of L1. The gastrostomy balloon is outlined by enteric contrast material, which outlines gastric rugae. Small volume contrast appears to track along the tubing leading to the gastrostomy balloon. Asymmetric elevation of the right hemidiaphragm. IMPRESSION: 1. Percutaneous gastrostomy tube projects over the midline upper abdomen at the level of L1 and appears intraluminal within the stomach. 2. Small volume contrast appears to track along the tubing leading to the gastrostomy balloon. Consider cross-table lateral view of the abdomen to evaluate extraluminal contrast material. Electronically Signed   By: Limin  Xu M.D.   On: 10/16/2024 13:22    {Document cardiac monitor, telemetry assessment procedure when appropriate:32947} Procedures   Medications Ordered in the ED  iohexol  (OMNIPAQUE ) 300 MG/ML solution 30 mL (30 mLs Per Tube Contrast Given 10/16/24 1256)    Clinical Course as of 10/16/24 1455  Sat Oct 16, 2024  1006 Consult placed to IR [RR]    Clinical Course User Index [RR] Bernis Ernst, PA-C   {Click here for ABCD2, HEART and other calculators REFRESH Note before signing:1}                              Medical Decision Making Amount and/or Complexity of Data Reviewed Radiology: ordered.  Risk Prescription drug management.   ***  {Document critical care time when appropriate  Document review of labs and clinical decision tools ie CHADS2VASC2, etc  Document your independent review of radiology images and any outside records  Document your discussion with family members, caretakers and  with consultants  Document social determinants of health affecting pt's care  Document your decision making why or why not admission, treatments were needed:32947:::1}   Final diagnoses:  None    ED Discharge Orders     None

## 2024-10-17 ENCOUNTER — Emergency Department (HOSPITAL_COMMUNITY)

## 2024-10-17 ENCOUNTER — Emergency Department (HOSPITAL_COMMUNITY)
Admission: EM | Admit: 2024-10-17 | Discharge: 2024-10-17 | Disposition: A | Discharge status: Home / Self Care | Attending: Emergency Medicine | Admitting: Emergency Medicine

## 2024-10-17 DIAGNOSIS — R633 Feeding difficulties, unspecified: Secondary | ICD-10-CM | POA: Diagnosis not present

## 2024-10-17 DIAGNOSIS — K9423 Gastrostomy malfunction: Secondary | ICD-10-CM | POA: Diagnosis not present

## 2024-10-17 DIAGNOSIS — Z789 Other specified health status: Secondary | ICD-10-CM

## 2024-10-17 DIAGNOSIS — Z85818 Personal history of malignant neoplasm of other sites of lip, oral cavity, and pharynx: Secondary | ICD-10-CM | POA: Insufficient documentation

## 2024-10-17 HISTORY — PX: IR GJ TUBE CHANGE: IMG1440

## 2024-10-17 MED ORDER — LIDOCAINE HCL 1 % IJ SOLN
INTRAMUSCULAR | Status: AC
  Filled 2024-10-17: qty 20

## 2024-10-17 MED ORDER — LIDOCAINE HCL URETHRAL/MUCOSAL 2 % EX GEL: 1.0000 | Freq: Once | CUTANEOUS | Status: DC

## 2024-10-17 MED ORDER — IOHEXOL 300 MG/ML  SOLN
50.0000 mL | Freq: Once | INTRAMUSCULAR | Status: AC | PRN
  Administered 2024-10-17: 20 mL

## 2024-10-17 MED ORDER — LIDOCAINE VISCOUS HCL 2 % MT SOLN
OROMUCOSAL | Status: AC
  Filled 2024-10-17: qty 15

## 2024-10-17 NOTE — Procedures (Addendum)
 Pre procedural Dx: GJ replacement  Post procedural Dx: Same  Technically successful 22Fr GJ tube placement.  OK to use GJ tube.  Previous tube had fallen out   EBL: Trace Complications: None immediate  KANDICE Banner, MD Pager #: [phone]

## 2024-10-17 NOTE — ED Provider Notes (Signed)
 [place] EMERGENCY DEPARTMENT AT [facility] Provider Note   CSN: [id number] Arrival date & time: 10/17/24  9075     Patient presents with: No chief complaint on file.   Lee Barnes is a 73 y.o. male with h/o oropharyngeal cancer and aspiration pneumonia requiring GJ tube feeds presents emerged from today for evaluation of tube displacement.  Patient was seen in the emergency department yesterday by myself.  IR recommended placing G-tube in place of GJ tube until patient to be seen outpatient for placement.  Similar event happened in September where IR placed a G-tube at bedside and placed for GJ tube to be placed.  Patient reports that he had slower feeds at this time.  Today, he reports that his tubing fell out around 0830 today.  He was able to get morning feeds and medicines today. HPI     Prior to Admission medications   Medication Sig Start Date End Date Taking? Authorizing Provider  acetaminophen  (TYLENOL ) 325 MG tablet Place 2 tablets (650 mg total) into feeding tube every 6 (six) hours as needed for mild pain (or Fever >/= 101). 02/05/23   Will [name], MD  albuterol  (PROVENTIL ) (2.5 MG/3ML) 0.083% nebulizer solution Take 3 mLs (2.5 mg total) by nebulization every 4 (four) hours as needed for wheezing or shortness of breath. 10/05/24   Joyce Norleen BROCKS, MD  amoxicillin -clavulanate (AUGMENTIN ) 875-125 MG tablet Take 1 tablet by mouth 2 (two) times daily. 10/13/24   Jha, Panav, MD  azelastine  (ASTELIN ) 0.1 % nasal spray Place 1 spray into both nostrils 2 (two) times daily. Use in each nostril as directed 10/13/24   Jha, Panav, MD  cetirizine (ZYRTEC) 10 MG chewable tablet Place 10 mg into feeding tube daily as needed for allergies or rhinitis.    [provider]  diphenhydrAMINE  (BENADRYL ) 25 MG tablet Take 12.5 mg by mouth at bedtime as needed for allergies (to dry secretions).    [provider]  fluticasone  (FLONASE ) 50 MCG/ACT nasal spray  SPRAY TWO SPRAYS IN EACH NOSTRIL ONCE DAILY 08/15/23   Joyce Norleen BROCKS, MD  guaiFENesin  (ROBITUSSIN) 100 MG/5ML liquid Place 30 mLs into feeding tube 2 (two) times daily. 04/09/24   Early, Sara E, NP  hydrALAZINE  (APRESOLINE ) 25 MG tablet Place 1 tablet (25 mg total) into feeding tube every 8 (eight) hours as needed (for SBP greater than 175). 02/05/23   Will [name], MD  midodrine  (PROAMATINE ) 5 MG tablet Take 5 mg by mouth 3 (three) times daily with meals as needed (low BP).    [provider]  Nutritional Supplements (FEEDING SUPPLEMENT, OSMOLITE 1.5 CAL,) LIQD Place 1,425 mLs into feeding tube daily. 10/16/22   Briana Elgin DELENA, MD  nystatin  (MYCOSTATIN ) 100000 UNIT/ML suspension Take 5 mLs (500,000 Units total) by mouth 4 (four) times daily. X 1 week. Swish and hold in mouth for at least 2 minutes and then spit Patient not taking: Reported on 10/13/2024 04/09/24   Early, Camie BRAVO, NP  ondansetron  (ZOFRAN ) 4 MG/5ML solution Place 4 mg into feeding tube every 8 (eight) hours as needed for nausea or vomiting. Patient not taking: Reported on 10/13/2024    [provider]  pantoprazole  sodium (PROTONIX ) 40 mg Place 40 mg into feeding tube daily. Patient not taking: Reported on 10/13/2024 04/09/24   Early, Sara E, NP  predniSONE  (DELTASONE ) 20 MG tablet Take 2 tablets (40 mg total) by mouth daily with breakfast for 5 days. 10/13/24 10/18/24  Jha, Panav,  MD  sodium chloride  (OCEAN) 0.65 % SOLN nasal spray Place 1 spray into both nostrils as needed for congestion. 12/30/22   Regalado, Belkys A, MD  sodium chloride  HYPERTONIC 3 % nebulizer solution Take 4 mLs by nebulization daily. 12/31/22   Regalado, Owen A, MD    Allergies: Codeine    Review of Systems  Constitutional:  Negative for fever.  Gastrointestinal:  Negative for abdominal pain, nausea and vomiting.    Updated Vital Signs BP (!) 131/92 (BP Location: Right Arm)   Pulse 87   Temp (!) 97.5 F (36.4 C) (Oral)   Resp  18   SpO2 96%   Physical Exam Vitals and nursing note reviewed.  Constitutional:      General: He is not in acute distress.    Appearance: He is not toxic-appearing.  Eyes:     General: No scleral icterus. Pulmonary:     Effort: Pulmonary effort is normal. No respiratory distress.  Abdominal:     Comments: Stoma with tube present in the mid abdomen. Abdomen is soft and non tender.   Skin:    General: Skin is warm and dry.  Neurological:     Mental Status: He is alert.     (all labs ordered are listed, but only abnormal results are displayed) Labs Reviewed - No data to display  EKG: None  Radiology: DG Abdomen 1 View Result Date: 10/16/2024 CLINICAL DATA:  Evaluation gastrostomy tube. EXAM: ABDOMEN - 1 VIEW COMPARISON:  Abdominal film earlier today. FINDINGS: Gastrostomy tube retention balloon within anterior abdominal cavity confirmed to be within the stomach by contrast injection earlier today. No contrast is identified in the peritoneal cavity. No evidence of free intraperitoneal air. IMPRESSION: No injected contrast material in the peritoneal cavity. No free intraperitoneal air. Electronically Signed   By: Marcey Moan M.D.   On: 10/16/2024 14:43   DG Abdomen PEG Tube Location Result Date: 10/16/2024 CLINICAL DATA:  Enteric tube placement EXAM: ABDOMEN - 1 VIEW COMPARISON:  Abdominal radiograph dated 01/31/2023 FINDINGS: Percutaneous gastrostomy tube projects over the midline upper abdomen at the level of L1. The gastrostomy balloon is outlined by enteric contrast material, which outlines gastric rugae. Small volume contrast appears to track along the tubing leading to the gastrostomy balloon. Asymmetric elevation of the right hemidiaphragm. IMPRESSION: 1. Percutaneous gastrostomy tube projects over the midline upper abdomen at the level of L1 and appears intraluminal within the stomach. 2. Small volume contrast appears to track along the tubing leading to the gastrostomy  balloon. Consider cross-table lateral view of the abdomen to evaluate extraluminal contrast material. Electronically Signed   By: Limin  Xu M.D.   On: 10/16/2024 13:22   Procedures   Medications Ordered in the ED  lidocaine  (XYLOCAINE ) 2 % jelly 1 Application (has no administration in time range)  iohexol  (OMNIPAQUE ) 300 MG/ML solution 50 mL (20 mLs Other Contrast Given 10/17/24 1325)    Clinical Course as of 10/17/24 1341  Sun Oct 17, 2024  1039 Awaiting to hear back from IR.  [RR]    Clinical Course User Index [RR] Bernis Ernst, PA-C   Medical Decision Making  73 y.o. male presents to the ER today for evaluation of G-tube dislodged. Differential diagnosis includes but is not limited to replacement versus IR intervention. Vital signs mildly elevated BP, temp 97.91F, otherwise unremarkable. Physical exam as noted above.   Given patient's recurrence in the ER after yesterday's visit and G-tube not staying in place, IR did reach out and  will schedule the patient for procedure today.  GJ tube was placed with IR and Dr. [name].  Please see their note for further information on the procedure.  Patient is well-appearing and is ready for discharge home.  Vies the patient to follow discharge instructions given to him by interventional radiology and to follow-up with his PCP as needed.  Return precautions discussed. We discussed plan at bedside. We discussed strict return precautions and red flag symptoms. The patient verbalized their understanding and agrees to the plan. The patient is stable and being discharged home in good condition.  Portions of this report may have been transcribed using voice recognition software. Every effort was made to ensure accuracy; however, inadvertent computerized transcription errors may be present.    Final diagnoses:  Encounter for gastrojejunal (GJ) tube placement  Feeding difficulties    ED Discharge Orders     None          Bernis Ernst,  [place] 10/17/24 1343    Rogelia Jerilynn RAMAN, MD 10/18/24 [phone]

## 2024-10-17 NOTE — ED Triage Notes (Addendum)
 Pt ambulatory to triage with complaints of his feeding tube being dislodged. PT states that the tube fully came out, and he pushed it back in to keep the tract patent. Pt is concerned that the tube is broken off inside.

## 2024-10-17 NOTE — Discharge Instructions (Addendum)
 You were seen in the emergency department today for evaluation.  I am glad that you are GJ tube is able to be replaced.  Please follow the instructions given to you by interventional radiology.  If you have any concerns, new or worsening symptoms, please return to your nearest Emergency Department for evaluation.

## 2024-10-18 NOTE — Telephone Encounter (Signed)
 Left voicemail for patient to call back.

## 2024-10-19 ENCOUNTER — Encounter: Payer: Self-pay | Admitting: Family Medicine

## 2024-10-22 ENCOUNTER — Telehealth: Payer: Self-pay | Admitting: Licensed Clinical Social Worker

## 2024-10-22 ENCOUNTER — Encounter: Payer: Self-pay | Admitting: Licensed Clinical Social Worker

## 2024-10-28 ENCOUNTER — Telehealth: Payer: Self-pay

## 2024-10-28 NOTE — Progress Notes (Signed)
 Complex Care Management Note Care Guide Note  10/28/2024 Name: Lee Barnes MRN: [id number] DOB: 02-21-1951   Complex Care Management Outreach Attempts: An unsuccessful telephone outreach was attempted today to offer the patient information about available complex care management services.  Follow Up Plan:  No further outreach attempts will be made at this time. We have been unable to contact the patient to offer or enroll patient in complex care management services.  Encounter Outcome:  Patient Request to Call Back-Wife will call back  Leotis Rase [facility] [place] Guide  Direct Dial: [phone]  Fax [phone]

## 2024-10-29 ENCOUNTER — Telehealth: Payer: Self-pay

## 2024-10-29 NOTE — Progress Notes (Signed)
 Complex Care Management Note Care Guide Note  10/29/2024 Name: Lee Barnes MRN: [id number] DOB: November 07, 1951   Complex Care Management Outreach Attempts: An unsuccessful telephone outreach was attempted today to offer the patient information about available complex care management services.  Follow Up Plan:  No further outreach attempts will be made at this time. We have been unable to contact the patient to offer or enroll patient in complex care management services.  Encounter Outcome:  Spoke with wife and they will call back if he wants to schedule appt with BSW.  Leotis Rase [facility], [facility] Guide  Direct Dial: [phone]  Fax [phone]

## 2024-11-05 ENCOUNTER — Telehealth

## 2024-11-05 NOTE — Patient Instructions (Signed)
 Visit Information  Thank you for taking time to visit with me today. Please don't hesitate to contact me if I can be of assistance to you before our next scheduled appointment.  Your next care management appointment is by telephone on Thursday, January 15 at 09:30 AM  Please call the care guide team at [phone] if you need to cancel, schedule, or reschedule an appointment.   Please call 1-800-273-TALK (toll free, 24 hour hotline) if you are experiencing a Mental Health or Behavioral Health Crisis or need someone to talk to.  Clayborne Ly RN BSN CCM [place]  [facility], [facility] Nurse Care Coordinator  Direct Dial: [phone] Website: [email or web address]

## 2024-11-05 NOTE — Patient Outreach (Signed)
 Complex Care Management   Visit Note  11/05/2024  Name:  Lee Barnes MRN: [id number] DOB: June 09, 1951  Situation: Referral received for Complex Care Management related to  Hypertension; Neurogenic orthostatic hypotension; dysphagia with tube feeding; impaired gait/limited mobility with leg and arm weakness. I obtained verbal consent from Patient.  Visit completed with Patient on the phone.  Background:   Past Medical History:  Diagnosis Date   Acute aspiration pneumonia (HCC) 11/13/2022   Allergy to environmental factors    Basal cell carcinoma of left cheek 07/17/2022   Complication of anesthesia    vageled after surgery -overnite stay   Diverticulosis 2008   Heart murmur    hx of in childhood    History of chemotherapy    History of radiation therapy 11/05/10-12/26/10   r base tongue, 7000 cGy 35 sessions   Hypertension    Medication started in May 2016.   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   Tinnitus     Assessment: Patient Reported Symptoms:  Cognitive Cognitive Status: No symptoms reported (completed call with [name])      Neurological Neurological Review of Symptoms: Weakness Neurological Management Strategies: Adequate rest Neurological Self-Management Outcome: 4 (good)  HEENT HEENT Symptoms Reported: No symptoms reported      Cardiovascular Cardiovascular Symptoms Reported: Other: Other Cardiovascular Symptoms: recent elevated BP x2 isolated events while in the ER Does patient have uncontrolled Hypertension?: No Cardiovascular Management Strategies: Adequate rest, Medication therapy, Routine screening Cardiovascular Self-Management Outcome: 3 (uncertain) Cardiovascular Comment: patient/wife Lee monitor BP at home at least 3 times weekly and record readings  Respiratory Respiratory Symptoms Reported: Wheezing, Productive cough, Shortness of breath Other Respiratory Symptoms:  intermittent, using breathing treatments and suction as needed Respiratory Management Strategies: Adequate rest, Medication therapy, Routine screening Respiratory Self-Management Outcome: 4 (good)  Endocrine Endocrine Symptoms Reported: No symptoms reported Is patient diabetic?: No    Gastrointestinal Gastrointestinal Symptoms Reported: Other, Change in appetite Gastrointestinal Management Strategies: Diet modification, Nutrition support Enteral Nutrition Time Frame: Continuous Enteral Nutrition Schedule: Jevity, Ensure Parenteral Nutrition Method of Administration: Continuous Gastrointestinal Self-Management Outcome: 4 (good) Gastrointestinal Comment: recent ER visit to have J tube replaced due to dislodgement, issue resolved and patient is back to baseline    Genitourinary Genitourinary Symptoms Reported: No symptoms reported    Integumentary Integumentary Symptoms Reported: No symptoms reported    Musculoskeletal Musculoskelatal Symptoms Reviewed: Weakness, Limited mobility Musculoskeletal Management Strategies: Routine screening, Adequate rest Musculoskeletal Self-Management Outcome: 4 (good) Falls in the past year?: No Number of falls in past year: 1 or less Was there an injury with Fall?: No Fall Risk Category Calculator: 0 Patient Fall Risk Level: Low Fall Risk Patient at Risk for Falls Due to: Impaired mobility Fall risk Follow up: Falls evaluation completed  Psychosocial Psychosocial Symptoms Reported: No symptoms reported   Major Change/Loss/Stressor/Fears (CP): Medical condition, self Techniques to Cope with Loss/Stress/Change: Diversional activities Quality of Family Relationships: involved, helpful, supportive Do you feel physically threatened by others?: No    11/05/2024    PHQ2-9 Depression Screening   Lee Barnes interest or pleasure in doing things    Feeling down, depressed, or hopeless    PHQ-2 - Total Score    Trouble falling or staying asleep, or sleeping too  much    Feeling tired or having Lee Barnes energy    Poor appetite or overeating     Feeling  bad about yourself - or that you are a failure or have let yourself or your family down    Trouble concentrating on things, such as reading the newspaper or watching television    Moving or speaking so slowly that other people could have noticed.  Or the opposite - being so fidgety or restless that you have been moving around a lot more than usual    Thoughts that you would be better off dead, or hurting yourself in some way    PHQ2-9 Total Score    If you checked off any problems, how difficult have these problems made it for you to do your work, take care of things at home, or get along with other people    Depression Interventions/Treatment      There were no vitals filed for this visit. Pain Scale: Not given for pain  Medications Reviewed Today     Reviewed by Lee Clayborne CROME, RN (Registered Nurse) on 11/05/24 at 0957  Med List Status: <None>   Medication Order Taking? Sig Documenting Provider Last Dose Status Informant  acetaminophen  (TYLENOL ) 325 MG tablet [id number]  Place 2 tablets (650 mg total) into feeding tube every 6 (six) hours as needed for mild pain (or Fever >/= 101). Lee Lee Barnes  Active            Med Note Lee Barnes   Wed Oct 13, 2024  3:22 PM) As needed  albuterol  (PROVENTIL ) (2.5 MG/3ML) 0.083% nebulizer solution [id number]  Take 3 mLs (2.5 mg total) by nebulization every 4 (four) hours as needed for wheezing or shortness of breath. Lee Barnes  Active            Med Note Lee Barnes Oct 13, 2024  3:22 PM) Lee Barnes last used  amoxicillin -clavulanate (AUGMENTIN ) 875-125 MG tablet [id number]  Take 1 tablet by mouth 2 (two) times daily. Lee Barnes  Active   azelastine  (ASTELIN ) 0.1 % nasal spray [id number]  Place 1 spray into both nostrils 2 (two) times daily. Use in each nostril as directed Lee Barnes  Active   cetirizine  (ZYRTEC) 10 MG chewable tablet [id number]  Place 10 mg into feeding tube daily as needed for allergies or rhinitis. Provider, Historical, Barnes  Active Self, Pharmacy Records  diphenhydrAMINE  (BENADRYL ) 25 MG tablet [id number]  Take 12.5 mg by mouth at bedtime as needed for allergies (to dry secretions). Provider, Historical, Barnes  Active Self, Pharmacy Records           Med Note [name], VERONICA Barnes   Tue Jan 27, 2024  2:26 PM) 1/2 tab nightly  fluticasone  (FLONASE ) 50 MCG/ACT nasal spray [id number]  SPRAY TWO SPRAYS IN EACH NOSTRIL ONCE DAILY Lee Barnes  Active   guaiFENesin  (ROBITUSSIN) 100 MG/5ML liquid [id number]  Place 30 mLs into feeding tube 2 (two) times daily. Early, Sara E, NP  Active   hydrALAZINE  (APRESOLINE ) 25 MG tablet [id number]  Place 1 tablet (25 mg total) into feeding tube every 8 (eight) hours as needed (for SBP greater than 175). Lee Lee Barnes  Active            Med Note Lee Barnes Oct 13, 2024  3:23 PM) As needed if BP is elevated  midodrine  (PROAMATINE ) 5 MG tablet [id number]  Take 5 mg by mouth 3 (three) times daily with meals as needed (low BP). Provider, Historical, Barnes  Active Self  Med Note TEODORA LUCIENNE Barnes Stevan Oct 13, 2024  3:23 PM) As needed  Nutritional Supplements (FEEDING SUPPLEMENT, OSMOLITE 1.5 CAL,) LIQD [id number]  Place 1,425 mLs into feeding tube daily. Briana Elgin LABOR, Barnes  Active Self, Pharmacy Records           Med Note [name], LUCIENNE Barnes Schaumann Jan 16, 2023 11:11 Barnes) And protein supplement  nystatin  (MYCOSTATIN ) 100000 UNIT/ML suspension [id number]  Take 5 mLs (500,000 Units total) by mouth 4 (four) times daily. X 1 week. Swish and hold in mouth for at least 2 minutes and then spit  Patient not taking: Reported on 10/13/2024   Oris Camie BRAVO, NP  Active   ondansetron  (ZOFRAN ) 4 MG/5ML solution [id number]  Place 4 mg into feeding tube every 8 (eight) hours as needed for nausea or vomiting.  Patient not taking: Reported on  10/13/2024   Provider, Historical, Barnes  Active Self  pantoprazole  sodium (PROTONIX ) 40 mg [id number]  Place 40 mg into feeding tube daily.  Patient not taking: Reported on 10/13/2024   Early, Sara E, NP  Active   sodium chloride  (OCEAN) 0.65 % SOLN nasal spray [id number]  Place 1 spray into both nostrils as needed for congestion. Regalado, Belkys A, Barnes  Active Self, Pharmacy Records  sodium chloride  HYPERTONIC 3 % nebulizer solution [id number]  Take 4 mLs by nebulization daily. Madelyne Owen LABOR, Barnes  Active Self, Pharmacy Records  Med List Note Isabel Doneta RAMAN, [place] 06/26/22 9755): Extra fluids at 1000 - Gatorade or Pedialyte            Recommendation:   PCP Follow-up Continue Current Plan of Care  11/16/2024 Status: Sch   Time: 10:10 Barnes Length: 40  Visit Type: MEDICARE AWV, SEQUENTIAL [1973] Copay: $0.00  Provider: [facility]-ANNUAL WELLNESS VISIT Department: [facility]   Follow Up Plan:   Telephone follow up appointment date/time    12/02/2024 Status: Sch   Time: 9:30 Barnes Length: 30  Visit Type: VBCI TELEPHONE CALL 30 [2502] Copay: $0.00  Provider: Morgan Clayborne CROME, RN Department: [facility]   Clayborne Morgan RN BSN CCM [place]  [facility] Nurse Care Coordinator  Direct Dial: [phone] Website: [email or web address]

## 2024-11-15 ENCOUNTER — Other Ambulatory Visit: Payer: Self-pay | Admitting: Family Medicine

## 2024-11-15 DIAGNOSIS — J3089 Other allergic rhinitis: Secondary | ICD-10-CM

## 2024-11-16 ENCOUNTER — Ambulatory Visit: Payer: Medicare Other | Admitting: *Deleted

## 2024-11-16 VITALS — Wt 165.0 lb

## 2024-11-16 DIAGNOSIS — Z0000 Encounter for general adult medical examination without abnormal findings: Secondary | ICD-10-CM | POA: Diagnosis not present

## 2024-11-16 NOTE — Progress Notes (Addendum)
 "  Chief Complaint  Patient presents with   Medicare Wellness     Subjective:   Lee Barnes is a 73 y.o. male who presents for a Welcome to Medicare Exam.    No voiced or noted concerns at this time   Visit info / Clinical Intake: Medicare Wellness Visit Type:: Subsequent Annual Wellness Visit Persons participating in visit and providing information:: patient Medicare Wellness Visit Mode:: Video Since this visit was completed virtually, some vitals may be partially provided or unavailable. Missing vitals are due to the limitations of the virtual format.: Unable to obtain vitals - no equipment If Telephone or Video please confirm:: I connected with patient using audio/video enable telemedicine. I verified patient identity with two identifiers, discussed telehealth limitations, and patient agreed to proceed. Patient Location:: home Provider Location:: home Interpreter Needed?: No Pre-visit prep was completed: no AWV questionnaire completed by patient prior to visit?: no Living arrangements:: lives with spouse/significant other Patient's Overall Health Status Rating: (!) fair Typical amount of pain: some Does pain affect daily life?: no Are you currently prescribed opioids?: no  Dietary Habits and Nutritional Risks How many meals a day?: other: (contious feed feeding tube) Eats fruit and vegetables daily?: (!) no Most meals are obtained by: having others provide food In the last 2 weeks, have you had any of the following?: none Diabetic:: no  Functional Status Activities of Daily Living (to include ambulation/medication): (!) Dependent Feeding: Dependent Dressing/Grooming: Independent Bathing: Independent Toileting: Independent Transfer: Independent Ambulation: Independent Medication Administration: Needs assistance (comment) Home Management (perform basic housework or laundry): Dependent Manage your own finances?: (!) no Primary transportation is: driving Concerns  about vision?: no *vision screening is required for WTM* Concerns about hearing?: (!) yes Uses hearing aids?: (!) yes Hear whispered voice?: (!) no *in-person visit only*  Fall Screening Falls in the past year?: 0 Number of falls in past year: 0 Was there an injury with Fall?: 0 Fall Risk Category Calculator: 0 Patient Fall Risk Level: Low Fall Risk  Fall Risk Patient at Risk for Falls Due to: No Fall Risks Fall risk Follow up: Falls evaluation completed; Education provided; Falls prevention discussed; Follow up appointment  Home and Transportation Safety: All rugs have non-skid backing?: yes All stairs or steps have railings?: yes Grab bars in the bathtub or shower?: yes Have non-skid surface in bathtub or shower?: yes Good home lighting?: yes Regular seat belt use?: yes Hospital stays in the last year:: no  Cognitive Assessment Difficulty concentrating, remembering, or making decisions? : no Will 6CIT or Mini Cog be Completed: yes What year is it?: 0 points What month is it?: 0 points Give patient an address phrase to remember (5 components): Its very sunny outside today in December About what time is it?: 0 points Count backwards from 20 to 1: 0 points Say the months of the year in reverse: 0 points Repeat the address phrase from earlier: 0 points 6 CIT Score: 0 points  Advance Directives (For Healthcare) Does Patient Have a Medical Advance Directive?: Yes Type of Advance Directive: Healthcare Power of [place]; Living will  Reviewed/Updated  Reviewed/Updated: Reviewed All (Medical, Surgical, Family, Medications, Allergies, Care Teams, Patient Goals); Family History; Medications; Allergies; Care Teams; Patient Goals; Medical History; Surgical History    Allergies (verified) Codeine   Current Medications (verified) Outpatient Encounter Medications as of 11/16/2024  Medication Sig   acetaminophen  (TYLENOL ) 325 MG tablet Place 2 tablets (650 mg total) into feeding  tube every 6 (six) hours  as needed for mild pain (or Fever >/= 101).   albuterol  (PROVENTIL ) (2.5 MG/3ML) 0.083% nebulizer solution Take 3 mLs (2.5 mg total) by nebulization every 4 (four) hours as needed for wheezing or shortness of breath.   azelastine  (ASTELIN ) 0.1 % nasal spray Place 1 spray into both nostrils 2 (two) times daily. Use in each nostril as directed   cetirizine (ZYRTEC) 10 MG chewable tablet Place 10 mg into feeding tube daily as needed for allergies or rhinitis.   diphenhydrAMINE  (BENADRYL ) 25 MG tablet Take 12.5 mg by mouth at bedtime as needed for allergies (to dry secretions).   fluticasone  (FLONASE ) 50 MCG/ACT nasal spray SPRAY TWO SPRAYS IN EACH NOSTRIL ONCE DAILY   guaiFENesin  (ROBITUSSIN) 100 MG/5ML liquid Place 30 mLs into feeding tube 2 (two) times daily.   hydrALAZINE  (APRESOLINE ) 25 MG tablet Place 1 tablet (25 mg total) into feeding tube every 8 (eight) hours as needed (for SBP greater than 175).   midodrine  (PROAMATINE ) 5 MG tablet Take 5 mg by mouth 3 (three) times daily with meals as needed (low BP).   Nutritional Supplements (FEEDING SUPPLEMENT, OSMOLITE 1.5 CAL,) LIQD Place 1,425 mLs into feeding tube daily.   pantoprazole  sodium (PROTONIX ) 40 mg Place 40 mg into feeding tube daily.   sodium chloride  (OCEAN) 0.65 % SOLN nasal spray Place 1 spray into both nostrils as needed for congestion.   sodium chloride  HYPERTONIC 3 % nebulizer solution Take 4 mLs by nebulization daily.   amoxicillin -clavulanate (AUGMENTIN ) 875-125 MG tablet Take 1 tablet by mouth 2 (two) times daily. (Patient not taking: Reported on 11/16/2024)   nystatin  (MYCOSTATIN ) 100000 UNIT/ML suspension Take 5 mLs (500,000 Units total) by mouth 4 (four) times daily. X 1 week. Swish and hold in mouth for at least 2 minutes and then spit (Patient not taking: Reported on 11/16/2024)   ondansetron  (ZOFRAN ) 4 MG/5ML solution Place 4 mg into feeding tube every 8 (eight) hours as needed for nausea or vomiting.  (Patient not taking: Reported on 11/16/2024)   No facility-administered encounter medications on file as of 11/16/2024.    History: Past Medical History:  Diagnosis Date   Acute aspiration pneumonia (HCC) 11/13/2022   Allergy to environmental factors    Basal cell carcinoma of left cheek 07/17/2022   Complication of anesthesia    vageled after surgery -overnite stay   Diverticulosis 2008   Heart murmur    hx of in childhood    History of chemotherapy    History of radiation therapy 11/05/10-12/26/10   r base tongue, 7000 cGy 35 sessions   Hypertension    Medication started in May 2016.   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   Tinnitus    Past Surgical History:  Procedure Laterality Date   APPENDECTOMY     COLONOSCOPY     INGUINAL HERNIA REPAIR Bilateral 03/30/2015   Procedure: LAPAROSCOPIC BILATERAL INGUINAL HERNIA REPAIR WITH MESH;  Surgeon: Vicenta Poli, MD;  Location: [facility];  Service: General;  Laterality: Bilateral;   INGUINAL HERNIA REPAIR Right 11/18/1958   INGUINAL HERNIA REPAIR Bilateral 03/30/2015   INGUINAL HERNIA REPAIR Left 07/11/2015   INGUINAL HERNIA REPAIR N/A 07/11/2015   Procedure: REPAIR OF LEFT INGUINAL HERNIA WITH MESH;  Surgeon: Vicenta Poli, MD;  Location: [facility] OR;  Service: General;  Laterality: N/A;   INSERTION OF MESH N/A 07/11/2015   Procedure: INSERTION OF MESH;  Surgeon: Vicenta Poli, MD;  Location: [facility] OR;  Service: General;  Laterality: N/A;   IR GASTR TUBE CONVERT GASTR-JEJ PER W/FL MOD SED  08/02/2024   IR GJ TUBE CHANGE  05/03/2024   IR GJ TUBE CHANGE  10/17/2024   IR REPLACE G-TUBE SIMPLE WO FLUORO  07/31/2024   IR REPLC GASTRO/COLONIC TUBE PERCUT W/FLUORO  12/03/2021   IR REPLC GASTRO/COLONIC TUBE PERCUT W/FLUORO  03/19/2023   IR REPLC GASTRO/COLONIC TUBE PERCUT W/FLUORO  11/10/2023   IR REPLC GASTRO/COLONIC TUBE PERCUT W/FLUORO  03/05/2024   LUMBAR DISC  SURGERY     PEG PLACEMENT N/A 03/22/2022   Procedure: PERCUTANEOUS ENDOSCOPIC GASTROSTOMY (PEG) REPLACEMENT;  Surgeon: Rollin Dover, MD;  Location: [facility];  Service: Gastroenterology;  Laterality: N/A;   SAVORY DILATION N/A 03/22/2022   Procedure: SAVORY DILATION;  Surgeon: Rollin Dover, MD;  Location: [facility];  Service: Gastroenterology;  Laterality: N/A;   SKIN BIOPSY Left 06/24/2022   basal cell carcinoma face cheek   TONSILLECTOMY     Family History  Problem Relation Age of Onset   Heart disease Father    Cancer Father        throat ca   Heart disease Mother    Colon cancer Neg Hx    Social History   Occupational History   Occupation: OWNER    Employer: [employer]  Tobacco Use   Smoking status: Never   Smokeless tobacco: Never  Vaping Use   Vaping status: Never Used  Substance and Sexual Activity   Alcohol  use: Not Currently    Comment: occas drinks a beer every few weeks    Drug use: No    Comment:     Sexual activity: Yes   Tobacco Counseling Counseling given: Not Answered  SDOH Screenings   Food Insecurity: No Food Insecurity (11/16/2024)  Housing: Unknown (11/16/2024)  Transportation Needs: No Transportation Needs (11/16/2024)  Utilities: Not At Risk (11/16/2024)  Alcohol  Screen: Low Risk (04/09/2024)  Depression (PHQ2-9): Low Risk (11/16/2024)  Financial Resource Strain: Low Risk (04/09/2024)  Physical Activity: Sufficiently Active (11/16/2024)  Social Connections: Socially Isolated (11/16/2024)  Stress: No Stress Concern Present (11/16/2024)  Tobacco Use: Low Risk (11/16/2024)  Health Literacy: Adequate Health Literacy (11/16/2024)   See flowsheets for full screening details  Depression Screen Depression Screening Exception Documentation Depression Screening Exception:: Medical reason (patient unable to speak clearly, not present during call)  PHQ 2 & 9 Depression Scale- Over the past 2 weeks, how often have you been bothered by any  of the following problems? Little interest or pleasure in doing things: 0 Feeling down, depressed, or hopeless (PHQ Adolescent also includes...irritable): 0 PHQ-2 Total Score: 0 Trouble falling or staying asleep, or sleeping too much: 0 Feeling tired or having little energy: 1 Poor appetite or overeating (PHQ Adolescent also includes...weight loss): 0 Feeling bad about yourself - or that you are a failure or have let yourself or your family down: 0 Trouble concentrating on things, such as reading the newspaper or watching television (PHQ Adolescent also includes...like school work): 0 Moving or speaking so slowly that other people could have noticed. Or the opposite - being so fidgety or restless that you have been moving around a lot more than usual: 0 PHQ-9 Total Score: 1 If you checked off any problems, how difficult have these problems made it for you to do your work, take care of things at home, or get along with other people?: Not difficult at all  Goals Addressed             This Visit's Progress    Patient Stated       No goals at this time             Objective:    Today's Vitals   11/16/24 1020  Weight: 165 lb (74.8 kg)   Body mass index is 22.38 kg/m.      Hearing/Vision screen Hearing Screening - Comments:: L ear hearing aid Permanent hearing loss right ear Vision Screening - Comments:: [place] Up to date Immunizations and Health Maintenance Health Maintenance  Topic Date Due   Zoster Vaccines- Shingrix (1 of 2) 11/11/1970   Colonoscopy  01/21/2024   COVID-19 Vaccine (5 - 2025-26 season) 07/19/2024   DTaP/Tdap/Td (2 - Td or Tdap) 10/06/2024   Medicare Annual Wellness (AWV)  11/16/2025   Pneumococcal Vaccine: 50+ Years  Completed   Influenza Vaccine  Completed   Hepatitis C Screening  Completed   Meningococcal B Vaccine  Aged Out      Assessment/Plan:  This is a routine wellness examination for Lee Barnes.  Patient Care Team: Joyce Norleen BROCKS,  MD as PCP - General (Family Medicine) Ethyl Lonni BRAVO, MD (Inactive) Jason Charleston, MD (Inactive) as Attending Physician (Radiation Oncology) Lonn Hicks, MD as Consulting Physician (Hematology and Oncology) Morgan, Clayborne CROME, RN as [facility] [place], Jon DEL, [facility] (Pharmacist)  I have personally reviewed and noted the following in the patients chart:   Medical and social history Use of alcohol , tobacco or illicit drugs  Current medications and supplements including opioid prescriptions. Functional ability and status Nutritional status Physical activity Advanced directives List of other physicians Hospitalizations, surgeries, and ER visits in previous 12 months Vitals Screenings to include cognitive, depression, and falls Referrals and appointments  No orders of the defined types were placed in this encounter.  In addition, I have reviewed and discussed with patient certain preventive protocols, quality metrics, and best practice recommendations. A written personalized care plan for preventive services as well as general preventive health recommendations were provided to patient.   Beuna Bolding, LPN   [id number]   Return in 1 year (on 11/16/2025).   Chief Complaint  Patient presents with   Medicare Wellness     Subjective:   Lee Barnes is a 73 y.o. male who presents for a Medicare Annual Wellness Visit.  Visit info / Clinical Intake: Medicare Wellness Visit Type:: Subsequent Annual Wellness Visit Persons participating in visit and providing information:: patient Medicare Wellness Visit Mode:: Video Since this visit was completed virtually, some vitals may be partially provided or unavailable. Missing vitals are due to the limitations of the virtual format.: Unable to obtain vitals - no equipment If Telephone or Video please confirm:: I connected with patient using audio/video enable telemedicine. I verified patient identity with two identifiers,  discussed telehealth limitations, and patient agreed to proceed. Patient Location:: home Provider Location:: home Interpreter Needed?: No Pre-visit prep was completed: no AWV questionnaire completed by patient prior to visit?: no Living arrangements:: lives with spouse/significant other Patient's Overall Health Status Rating: (!) fair Typical amount of pain: some Does pain affect daily life?: no Are you currently prescribed opioids?: no  Dietary Habits and Nutritional Risks How many meals a day?: other: (contious feed feeding tube) Eats fruit and vegetables daily?: (!) no Most meals are obtained by: having others provide food In the last 2 weeks, have you had any of the following?: none Diabetic:: no  Functional Status Activities of Daily Living (to include ambulation/medication): (!) Dependent Feeding: Dependent Dressing/Grooming: Independent Bathing: Independent Toileting: Independent Transfer: Independent Ambulation: Independent Medication Administration: Needs assistance (comment) Home Management (perform basic housework or laundry): Dependent Manage your own finances?: (!) no Primary transportation is: driving Concerns about vision?: no *vision screening is required for WTM* Concerns about hearing?: (!) yes Uses hearing aids?: (!) yes Hear whispered voice?: (!) no *in-person visit only*  Fall Screening Falls in the past year?: 0 Number of falls in past year: 0 Was there an injury with Fall?: 0 Fall Risk Category Calculator: 0 Patient Fall Risk Level: Low Fall Risk  Fall Risk Patient at Risk for Falls Due to: No Fall Risks Fall risk Follow up: Falls evaluation completed; Education provided; Falls prevention discussed; Follow up appointment  Home and Transportation Safety: All rugs have non-skid backing?: yes All stairs or steps have railings?: yes Grab bars in the bathtub or shower?: yes Have non-skid surface in bathtub or shower?: yes Good home lighting?:  yes Regular seat belt use?: yes Hospital stays in the last year:: no  Cognitive Assessment Difficulty concentrating, remembering, or making decisions? : no Will 6CIT or Mini Cog be Completed: yes What year is it?: 0 points What month is it?: 0 points Give patient an address phrase to remember (5 components): Its very sunny outside today in December About what time is it?: 0 points Count backwards from 20 to 1: 0 points Say the months of the year in reverse: 0 points Repeat the address phrase from earlier: 0 points 6 CIT Score: 0 points  Advance Directives (For Healthcare) Does Patient Have a Medical Advance Directive?: Yes Type of Advance Directive: Healthcare Power of [place]; Living will  Reviewed/Updated  Reviewed/Updated: Reviewed All (Medical, Surgical, Family, Medications, Allergies, Care Teams, Patient Goals); Family History; Medications; Allergies; Care Teams; Patient Goals; Medical History; Surgical History    Allergies (verified) Codeine   Current Medications (verified) Outpatient Encounter Medications as of 11/16/2024  Medication Sig   acetaminophen  (TYLENOL ) 325 MG tablet Place 2 tablets (650 mg total) into feeding tube every 6 (six) hours as needed for mild pain (or Fever >/= 101).   albuterol  (PROVENTIL ) (2.5 MG/3ML) 0.083% nebulizer solution Take 3 mLs (2.5 mg total) by nebulization every 4 (four) hours as needed for wheezing or shortness of breath.   azelastine  (ASTELIN ) 0.1 % nasal spray Place 1 spray into both nostrils 2 (two) times daily. Use in each nostril as directed   cetirizine (ZYRTEC) 10 MG chewable tablet Place 10 mg into feeding tube daily as needed for allergies or rhinitis.   diphenhydrAMINE  (BENADRYL ) 25 MG tablet Take 12.5 mg by mouth at bedtime as needed for allergies (to dry secretions).   fluticasone  (FLONASE ) 50 MCG/ACT nasal spray SPRAY TWO SPRAYS IN EACH NOSTRIL ONCE DAILY   guaiFENesin  (ROBITUSSIN) 100 MG/5ML liquid Place 30 mLs into  feeding tube 2 (two) times daily.   hydrALAZINE  (APRESOLINE ) 25 MG tablet Place 1 tablet (25 mg total) into feeding tube every 8 (eight) hours as needed (for SBP greater than 175).   midodrine  (PROAMATINE ) 5 MG tablet Take 5 mg by mouth 3 (three) times daily with meals as needed (low BP).   Nutritional Supplements (FEEDING SUPPLEMENT, OSMOLITE 1.5 CAL,) LIQD Place 1,425 mLs into feeding tube daily.   pantoprazole  sodium (PROTONIX ) 40 mg Place 40 mg into feeding tube daily.   sodium chloride  (OCEAN) 0.65 % SOLN nasal spray Place 1 spray into both nostrils as needed for  congestion.   sodium chloride  HYPERTONIC 3 % nebulizer solution Take 4 mLs by nebulization daily.   amoxicillin -clavulanate (AUGMENTIN ) 875-125 MG tablet Take 1 tablet by mouth 2 (two) times daily. (Patient not taking: Reported on 11/16/2024)   nystatin  (MYCOSTATIN ) 100000 UNIT/ML suspension Take 5 mLs (500,000 Units total) by mouth 4 (four) times daily. X 1 week. Swish and hold in mouth for at least 2 minutes and then spit (Patient not taking: Reported on 11/16/2024)   ondansetron  (ZOFRAN ) 4 MG/5ML solution Place 4 mg into feeding tube every 8 (eight) hours as needed for nausea or vomiting. (Patient not taking: Reported on 11/16/2024)   No facility-administered encounter medications on file as of 11/16/2024.    History: Past Medical History:  Diagnosis Date   Acute aspiration pneumonia (HCC) 11/13/2022   Allergy to environmental factors    Basal cell carcinoma of left cheek 07/17/2022   Complication of anesthesia    vageled after surgery -overnite stay   Diverticulosis 2008   Heart murmur    hx of in childhood    History of chemotherapy    History of radiation therapy 11/05/10-12/26/10   r base tongue, 7000 cGy 35 sessions   Hypertension    Medication started in May 2016.   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   Tinnitus    Past Surgical  History:  Procedure Laterality Date   APPENDECTOMY     COLONOSCOPY     INGUINAL HERNIA REPAIR Bilateral 03/30/2015   Procedure: LAPAROSCOPIC BILATERAL INGUINAL HERNIA REPAIR WITH MESH;  Surgeon: Vicenta Poli, MD;  Location: [facility];  Service: General;  Laterality: Bilateral;   INGUINAL HERNIA REPAIR Right 11/18/1958   INGUINAL HERNIA REPAIR Bilateral 03/30/2015   INGUINAL HERNIA REPAIR Left 07/11/2015   INGUINAL HERNIA REPAIR N/A 07/11/2015   Procedure: REPAIR OF LEFT INGUINAL HERNIA WITH MESH;  Surgeon: Vicenta Poli, MD;  Location: [facility] OR;  Service: General;  Laterality: N/A;   INSERTION OF MESH N/A 07/11/2015   Procedure: INSERTION OF MESH;  Surgeon: Vicenta Poli, MD;  Location: [facility] OR;  Service: General;  Laterality: N/A;   IR GASTR TUBE CONVERT GASTR-JEJ PER W/FL MOD SED  08/02/2024   IR GJ TUBE CHANGE  05/03/2024   IR GJ TUBE CHANGE  10/17/2024   IR REPLACE G-TUBE SIMPLE WO FLUORO  07/31/2024   IR REPLC GASTRO/COLONIC TUBE PERCUT W/FLUORO  12/03/2021   IR REPLC GASTRO/COLONIC TUBE PERCUT W/FLUORO  03/19/2023   IR REPLC GASTRO/COLONIC TUBE PERCUT W/FLUORO  11/10/2023   IR REPLC GASTRO/COLONIC TUBE PERCUT W/FLUORO  03/05/2024   LUMBAR DISC SURGERY     PEG PLACEMENT N/A 03/22/2022   Procedure: PERCUTANEOUS ENDOSCOPIC GASTROSTOMY (PEG) REPLACEMENT;  Surgeon: Rollin Dover, MD;  Location: [facility];  Service: Gastroenterology;  Laterality: N/A;   SAVORY DILATION N/A 03/22/2022   Procedure: SAVORY DILATION;  Surgeon: Rollin Dover, MD;  Location: [facility];  Service: Gastroenterology;  Laterality: N/A;   SKIN BIOPSY Left 06/24/2022   basal cell carcinoma face cheek   TONSILLECTOMY     Family History  Problem Relation Age of Onset   Heart disease Father    Cancer Father        throat ca   Heart disease Mother    Colon cancer Neg Hx    Social History   Occupational History   Occupation: OWNER    Employer: [employer]  Tobacco Use  Smoking status: Never    Smokeless tobacco: Never  Vaping Use   Vaping status: Never Used  Substance and Sexual Activity   Alcohol  use: Not Currently    Comment: occas drinks a beer every few weeks    Drug use: No    Comment:     Sexual activity: Yes   Tobacco Counseling Counseling given: Not Answered  SDOH Screenings   Food Insecurity: No Food Insecurity (11/16/2024)  Housing: Unknown (11/16/2024)  Transportation Needs: No Transportation Needs (11/16/2024)  Utilities: Not At Risk (11/16/2024)  Alcohol  Screen: Low Risk (04/09/2024)  Depression (PHQ2-9): Low Risk (11/16/2024)  Financial Resource Strain: Low Risk (04/09/2024)  Physical Activity: Sufficiently Active (11/16/2024)  Social Connections: Socially Isolated (11/16/2024)  Stress: No Stress Concern Present (11/16/2024)  Tobacco Use: Low Risk (11/16/2024)  Health Literacy: Adequate Health Literacy (11/16/2024)   See flowsheets for full screening details  Depression Screen Depression Screening Exception Documentation Depression Screening Exception:: Medical reason (patient unable to speak clearly, not present during call)  PHQ 2 & 9 Depression Scale- Over the past 2 weeks, how often have you been bothered by any of the following problems? Little interest or pleasure in doing things: 0 Feeling down, depressed, or hopeless (PHQ Adolescent also includes...irritable): 0 PHQ-2 Total Score: 0 Trouble falling or staying asleep, or sleeping too much: 0 Feeling tired or having little energy: 1 Poor appetite or overeating (PHQ Adolescent also includes...weight loss): 0 Feeling bad about yourself - or that you are a failure or have let yourself or your family down: 0 Trouble concentrating on things, such as reading the newspaper or watching television (PHQ Adolescent also includes...like school work): 0 Moving or speaking so slowly that other people could have noticed. Or the opposite - being so fidgety or restless that you have been moving around a lot more  than usual: 0 PHQ-9 Total Score: 1 If you checked off any problems, how difficult have these problems made it for you to do your work, take care of things at home, or get along with other people?: Not difficult at all     Goals Addressed             This Visit's Progress    Patient Stated       No goals at this time             Objective:    Today's Vitals   11/16/24 1020  Weight: 165 lb (74.8 kg)   Body mass index is 22.38 kg/m.  Hearing/Vision screen Hearing Screening - Comments:: L ear hearing aid Permanent hearing loss right ear Vision Screening - Comments:: [place] Up to date Immunizations and Health Maintenance Health Maintenance  Topic Date Due   Zoster Vaccines- Shingrix (1 of 2) 11/11/1970   Colonoscopy  01/21/2024   COVID-19 Vaccine (5 - 2025-26 season) 07/19/2024   DTaP/Tdap/Td (2 - Td or Tdap) 10/06/2024   Medicare Annual Wellness (AWV)  11/16/2025   Pneumococcal Vaccine: 50+ Years  Completed   Influenza Vaccine  Completed   Hepatitis C Screening  Completed   Meningococcal B Vaccine  Aged Out        Assessment/Plan:  This is a routine wellness examination for Lee Barnes.  Patient Care Team: Joyce Norleen BROCKS, MD as PCP - General (Family Medicine) Ethyl Lonni BRAVO, MD (Inactive) Jason Charleston, MD (Inactive) as Attending Physician (Radiation Oncology) Lonn Hicks, MD as Consulting Physician (Hematology and Oncology) Morgan, Clayborne CROME, RN as [facility] [place], Jon DEL, [facility] (Pharmacist)  I have personally reviewed and noted the following in the patients chart:   Medical and social history Use of alcohol , tobacco or illicit drugs  Current medications and supplements including opioid prescriptions. Functional ability and status Nutritional status Physical activity Advanced directives List of other physicians Hospitalizations, surgeries, and ER visits in previous 12 months Vitals Screenings to include cognitive, depression, and  falls Referrals and appointments  No orders of the defined types were placed in this encounter.  In addition, I have reviewed and discussed with patient certain preventive protocols, quality metrics, and best practice recommendations. A written personalized care plan for preventive services as well as general preventive health recommendations were provided to patient.   Kaveon Blatz, LPN   [id number]/[id number]   Return in 1 year (on 11/16/2025).  After Visit Summary:   Nurse Notes:  "

## 2024-11-16 NOTE — Patient Instructions (Signed)
 [name],  Thank you for taking the time for your Medicare Wellness Visit. I appreciate your continued commitment to your health goals. Please review the care plan we discussed, and feel free to reach out if I can assist you further.  Please note that Annual Wellness Visits do not include a physical exam. Some assessments may be limited, especially if the visit was conducted virtually. If needed, we may recommend an in-person follow-up with your provider.  Ongoing Care Seeing your primary care provider every 3 to 6 months helps us  monitor your health and provide consistent, personalized care.   Referrals If a referral was made during today's visit and you haven't received any updates within two weeks, please contact the referred provider directly to check on the status.  Recommended Screenings:  Health Maintenance  Topic Date Due   Zoster (Shingles) Vaccine (1 of 2) 11/11/1970   Colon Cancer Screening  01/21/2024   COVID-19 Vaccine (5 - 2025-26 season) 07/19/2024   DTaP/Tdap/Td vaccine (2 - Td or Tdap) 10/06/2024   Medicare Annual Wellness Visit  11/16/2025   Pneumococcal Vaccine for age over 34  Completed   Flu Shot  Completed   Hepatitis C Screening  Completed   Meningitis B Vaccine  Aged Out       10/16/2024    9:52 AM  Advanced Directives  Does Patient Have a Medical Advance Directive? Yes  Type of Estate Agent of [place];Living will    Vision: Annual vision screenings are recommended for early detection of glaucoma, cataracts, and diabetic retinopathy. These exams can also reveal signs of chronic conditions such as diabetes and high blood pressure.  Dental: Annual dental screenings help detect early signs of oral cancer, gum disease, and other conditions linked to overall health, including heart disease and diabetes.  Please see the attached documents for additional preventive care recommendations.    Lee Barnes , Thank you for taking time to  come for your Medicare Wellness Visit. I appreciate your ongoing commitment to your health goals. Please review the following plan we discussed and let me know if I can assist you in the future.   Screening recommendations/referrals: Colonoscopy:  Recommended yearly ophthalmology/optometry visit for glaucoma screening and checkup Recommended yearly dental visit for hygiene and checkup  Vaccinations: Influenza vaccine:  Pneumococcal vaccine:  Tdap vaccine:  Shingles vaccine:        Preventive Care 65 Years and Older, Male Preventive care refers to lifestyle choices and visits with your health care provider that can promote health and wellness. What does preventive care include? A yearly physical exam. This is also called an annual well check. Dental exams once or twice a year. Routine eye exams. Ask your health care provider how often you should have your eyes checked. Personal lifestyle choices, including: Daily care of your teeth and gums. Regular physical activity. Eating a healthy diet. Avoiding tobacco and drug use. Limiting alcohol  use. Practicing safe sex. Taking low doses of aspirin every day. Taking vitamin and mineral supplements as recommended by your health care provider. What happens during an annual well check? The services and screenings done by your health care provider during your annual well check will depend on your age, overall health, lifestyle risk factors, and family history of disease. Counseling  Your health care provider may ask you questions about your: Alcohol  use. Tobacco use. Drug use. Emotional well-being. Home and relationship well-being. Sexual activity. Eating habits. History of falls. Memory and ability to understand (cognition). Work  and work environment. Screening  You may have the following tests or measurements: Height, weight, and BMI. Blood pressure. Lipid and cholesterol levels. These may be checked every 5 years, or more  frequently if you are over 33 years old. Skin check. Lung cancer screening. You may have this screening every year starting at age 51 if you have a 30-pack-year history of smoking and currently smoke or have quit within the past 15 years. Fecal occult blood test (FOBT) of the stool. You may have this test every year starting at age 50. Flexible sigmoidoscopy or colonoscopy. You may have a sigmoidoscopy every 5 years or a colonoscopy every 10 years starting at age 26. Prostate cancer screening. Recommendations will vary depending on your family history and other risks. Hepatitis C blood test. Hepatitis B blood test. Sexually transmitted disease (STD) testing. Diabetes screening. This is done by checking your blood sugar (glucose) after you have not eaten for a while (fasting). You may have this done every 1-3 years. Abdominal aortic aneurysm (AAA) screening. You may need this if you are a current or former smoker. Osteoporosis. You may be screened starting at age 60 if you are at high risk. Talk with your health care provider about your test results, treatment options, and if necessary, the need for more tests. Vaccines  Your health care provider may recommend certain vaccines, such as: Influenza vaccine. This is recommended every year. Tetanus, diphtheria, and acellular pertussis (Tdap, Td) vaccine. You may need a Td booster every 10 years. Zoster vaccine. You may need this after age 22. Pneumococcal 13-valent conjugate (PCV13) vaccine. One dose is recommended after age 85. Pneumococcal polysaccharide (PPSV23) vaccine. One dose is recommended after age 60. Talk to your health care provider about which screenings and vaccines you need and how often you need them. This information is not intended to replace advice given to you by your health care provider. Make sure you discuss any questions you have with your health care provider. Document Released: 12/01/2015 Document Revised: 07/24/2016  Document Reviewed: 09/05/2015 Elsevier Interactive Patient Education  2017 [employer].  Fall Prevention in the Home Falls can cause injuries. They can happen to people of all ages. There are many things you can do to make your home safe and to help prevent falls. What can I do on the outside of my home? Regularly fix the edges of walkways and driveways and fix any cracks. Remove anything that might make you trip as you walk through a door, such as a raised step or threshold. Trim any bushes or trees on the path to your home. Use bright outdoor lighting. Clear any walking paths of anything that might make someone trip, such as rocks or tools. Regularly check to see if handrails are loose or broken. Make sure that both sides of any steps have handrails. Any raised decks and porches should have guardrails on the edges. Have any leaves, snow, or ice cleared regularly. Use sand or salt on walking paths during winter. Clean up any spills in your garage right away. This includes oil or grease spills. What can I do in the bathroom? Use night lights. Install grab bars by the toilet and in the tub and shower. Do not use towel bars as grab bars. Use non-skid mats or decals in the tub or shower. If you need to sit down in the shower, use a plastic, non-slip stool. Keep the floor dry. Clean up any water  that spills on the floor as soon as it  happens. Remove soap buildup in the tub or shower regularly. Attach bath mats securely with double-sided non-slip rug tape. Do not have throw rugs and other things on the floor that can make you trip. What can I do in the bedroom? Use night lights. Make sure that you have a light by your bed that is easy to reach. Do not use any sheets or blankets that are too big for your bed. They should not hang down onto the floor. Have a firm chair that has side arms. You can use this for support while you get dressed. Do not have throw rugs and other things on the floor  that can make you trip. What can I do in the kitchen? Clean up any spills right away. Avoid walking on wet floors. Keep items that you use a lot in easy-to-reach places. If you need to reach something above you, use a strong step stool that has a grab bar. Keep electrical cords out of the way. Do not use floor polish or wax that makes floors slippery. If you must use wax, use non-skid floor wax. Do not have throw rugs and other things on the floor that can make you trip. What can I do with my stairs? Do not leave any items on the stairs. Make sure that there are handrails on both sides of the stairs and use them. Fix handrails that are broken or loose. Make sure that handrails are as long as the stairways. Check any carpeting to make sure that it is firmly attached to the stairs. Fix any carpet that is loose or worn. Avoid having throw rugs at the top or bottom of the stairs. If you do have throw rugs, attach them to the floor with carpet tape. Make sure that you have a light switch at the top of the stairs and the bottom of the stairs. If you do not have them, ask someone to add them for you. What else can I do to help prevent falls? Wear shoes that: Do not have high heels. Have rubber bottoms. Are comfortable and fit you well. Are closed at the toe. Do not wear sandals. If you use a stepladder: Make sure that it is fully opened. Do not climb a closed stepladder. Make sure that both sides of the stepladder are locked into place. Ask someone to hold it for you, if possible. Clearly mark and make sure that you can see: Any grab bars or handrails. First and last steps. Where the edge of each step is. Use tools that help you move around (mobility aids) if they are needed. These include: Canes. Walkers. Scooters. Crutches. Turn on the lights when you go into a dark area. Replace any light bulbs as soon as they burn out. Set up your furniture so you have a clear path. Avoid moving your  furniture around. If any of your floors are uneven, fix them. If there are any pets around you, be aware of where they are. Review your medicines with your doctor. Some medicines can make you feel dizzy. This can increase your chance of falling. Ask your doctor what other things that you can do to help prevent falls. This information is not intended to replace advice given to you by your health care provider. Make sure you discuss any questions you have with your health care provider. Document Released: 08/31/2009 Document Revised: 04/11/2016 Document Reviewed: 12/09/2014 Elsevier Interactive Patient Education  2017 [employer].

## 2024-11-17 ENCOUNTER — Ambulatory Visit: Payer: Self-pay

## 2024-11-17 NOTE — Telephone Encounter (Signed)
 FYI Only or Action Required?: Action required by provider: Declines UC or mobile unit today. No appts in pt region until Friday. Asking if they can have a UA ordered/completed today and book soonest appt on Friday to review results. Please call wife [name] at [phone] .  Patient was last seen in primary care on 10/13/2024 by Vita Morrow, MD.  Called Nurse Triage reporting Back Pain.  Symptoms began several days ago.  Interventions attempted: Ice/heat application.  Symptoms are: gradually worsening.  Triage Disposition: See HCP Within 4 Hours (Or PCP Triage)  Patient/caregiver understands and will follow disposition?: No, wishes to speak with PCP    Spoke with pt and pts wife [name]. Onset left sided mid to low back pain 4 days ago, worse when lying in bed, standing up or stretching leg. Last night was 7-8/10. Currently 2/10 . No recent injury or strain. No confusion or recent dx with kidney stone. Denies inability to empty bladder. Fairly inactive, has a feeding tube on for 13 hours per day. Threw up several times around Sunday morning. Happens periodically r/t reflux, no changes from baseline. No new weakness of legs or feet.  Temp 100.5 F. Concerned new recliner may be contributing.   No appts today. Declines UC or mobile unit today, wife states pt has a complicated hx and does not feel comfortable/confident that someone other than provider as his home office could treat pt. No appts in pt region until Friday. Wife asking if they can have a UA ordered/completed today and book soonest appt on Friday to review results. Forwarding request to office. Advised UC or ED for worsening symptoms.     Copied from CRM #[id number]. Topic: Clinical - Red Word Triage >> Nov 17, 2024 10:16 AM Frederich PARAS wrote: [facility] that prompted transfer to Nurse Triage: back pain ,  phyical, having left back pain, unsue if its kidney pain, got a new reclyner and it has been going on for 4 days , may be kidney, may be  uti. Reason for Disposition  Fever > 100.4 F (38.0 C)  Answer Assessment - Initial Assessment Questions 1. LOCATION: Where does it hurt? (e.g., left, right)      Left sided mid to low back pain   2. ONSET: When did the pain start?     4 days ago  3. SEVERITY: How bad is the pain? (e.g., Scale 1-10; mild, moderate, or severe)     Currently 2/10 at rest, gets up to 7-8/10 with certain movements.  4. PATTERN: Does the pain come and go, or is it constant?      Constant, fluctuates with certain movements  5. CAUSE: What do you think is causing the pain?     Possibly r/t new recliner  6. OTHER SYMPTOMS:  Do you have any other symptoms? (e.g., fever, abdomen pain, vomiting, leg weakness, burning with urination, blood in urine)     Temp currently 100.5 F with forehead thermometer. Denies rest of above symptoms.  Protocols used: Flank Pain-A-AH

## 2024-11-17 NOTE — Telephone Encounter (Signed)
 No open appts today. Wife wants patient to have UA complete and schedule an appt for Friday. Please advise

## 2024-11-20 ENCOUNTER — Emergency Department (HOSPITAL_COMMUNITY)

## 2024-11-20 ENCOUNTER — Inpatient Hospital Stay (HOSPITAL_COMMUNITY)
Admission: EM | Admit: 2024-11-20 | Discharge: 2024-11-22 | DRG: 871 | Disposition: A | Discharge status: Home / Self Care | Attending: Internal Medicine | Admitting: Internal Medicine

## 2024-11-20 DIAGNOSIS — R062 Wheezing: Secondary | ICD-10-CM

## 2024-11-20 DIAGNOSIS — Z1152 Encounter for screening for COVID-19: Secondary | ICD-10-CM

## 2024-11-20 DIAGNOSIS — R112 Nausea with vomiting, unspecified: Secondary | ICD-10-CM | POA: Diagnosis present

## 2024-11-20 DIAGNOSIS — I10 Essential (primary) hypertension: Secondary | ICD-10-CM | POA: Diagnosis present

## 2024-11-20 DIAGNOSIS — Z808 Family history of malignant neoplasm of other organs or systems: Secondary | ICD-10-CM

## 2024-11-20 DIAGNOSIS — Z923 Personal history of irradiation: Secondary | ICD-10-CM | POA: Diagnosis not present

## 2024-11-20 DIAGNOSIS — D509 Iron deficiency anemia, unspecified: Secondary | ICD-10-CM | POA: Diagnosis present

## 2024-11-20 DIAGNOSIS — H9191 Unspecified hearing loss, right ear: Secondary | ICD-10-CM | POA: Diagnosis present

## 2024-11-20 DIAGNOSIS — D649 Anemia, unspecified: Secondary | ICD-10-CM | POA: Diagnosis not present

## 2024-11-20 DIAGNOSIS — K219 Gastro-esophageal reflux disease without esophagitis: Secondary | ICD-10-CM | POA: Diagnosis present

## 2024-11-20 DIAGNOSIS — Z931 Gastrostomy status: Secondary | ICD-10-CM | POA: Diagnosis not present

## 2024-11-20 DIAGNOSIS — Z789 Other specified health status: Secondary | ICD-10-CM | POA: Diagnosis present

## 2024-11-20 DIAGNOSIS — E875 Hyperkalemia: Secondary | ICD-10-CM | POA: Diagnosis present

## 2024-11-20 DIAGNOSIS — Z8249 Family history of ischemic heart disease and other diseases of the circulatory system: Secondary | ICD-10-CM | POA: Diagnosis not present

## 2024-11-20 DIAGNOSIS — Z66 Do not resuscitate: Secondary | ICD-10-CM | POA: Diagnosis present

## 2024-11-20 DIAGNOSIS — Z85818 Personal history of malignant neoplasm of other sites of lip, oral cavity, and pharynx: Secondary | ICD-10-CM

## 2024-11-20 DIAGNOSIS — Z85819 Personal history of malignant neoplasm of unspecified site of lip, oral cavity, and pharynx: Secondary | ICD-10-CM | POA: Diagnosis not present

## 2024-11-20 DIAGNOSIS — J9601 Acute respiratory failure with hypoxia: Secondary | ICD-10-CM | POA: Diagnosis present

## 2024-11-20 DIAGNOSIS — E871 Hypo-osmolality and hyponatremia: Secondary | ICD-10-CM | POA: Diagnosis present

## 2024-11-20 DIAGNOSIS — A419 Sepsis, unspecified organism: Principal | ICD-10-CM | POA: Diagnosis present

## 2024-11-20 DIAGNOSIS — Z85810 Personal history of malignant neoplasm of tongue: Secondary | ICD-10-CM

## 2024-11-20 DIAGNOSIS — G903 Multi-system degeneration of the autonomic nervous system: Secondary | ICD-10-CM | POA: Diagnosis present

## 2024-11-20 DIAGNOSIS — R6520 Severe sepsis without septic shock: Secondary | ICD-10-CM | POA: Diagnosis present

## 2024-11-20 DIAGNOSIS — Z79899 Other long term (current) drug therapy: Secondary | ICD-10-CM

## 2024-11-20 DIAGNOSIS — Z85828 Personal history of other malignant neoplasm of skin: Secondary | ICD-10-CM

## 2024-11-20 DIAGNOSIS — Z9221 Personal history of antineoplastic chemotherapy: Secondary | ICD-10-CM

## 2024-11-20 DIAGNOSIS — I959 Hypotension, unspecified: Secondary | ICD-10-CM

## 2024-11-20 DIAGNOSIS — J189 Pneumonia, unspecified organism: Principal | ICD-10-CM | POA: Diagnosis present

## 2024-11-20 DIAGNOSIS — J690 Pneumonitis due to inhalation of food and vomit: Secondary | ICD-10-CM | POA: Diagnosis present

## 2024-11-20 DIAGNOSIS — Z885 Allergy status to narcotic agent status: Secondary | ICD-10-CM

## 2024-11-20 LAB — CBC WITH DIFFERENTIAL/PLATELET
Abs Immature Granulocytes: 0.05 K/uL (ref 0.00–0.07)
Basophils Absolute: 0 K/uL (ref 0.0–0.1)
Basophils Relative: 0 %
Eosinophils Absolute: 0 K/uL (ref 0.0–0.5)
Eosinophils Relative: 0 %
HCT: 35.7 % — ABNORMAL LOW (ref 39.0–52.0)
Hemoglobin: 12.1 g/dL — ABNORMAL LOW (ref 13.0–17.0)
Immature Granulocytes: 0 %
Lymphocytes Relative: 2 %
Lymphs Abs: 0.2 K/uL — ABNORMAL LOW (ref 0.7–4.0)
MCH: 29.7 pg (ref 26.0–34.0)
MCHC: 33.9 g/dL (ref 30.0–36.0)
MCV: 87.5 fL (ref 80.0–100.0)
Monocytes Absolute: 0.9 K/uL (ref 0.1–1.0)
Monocytes Relative: 6 %
Neutro Abs: 13.4 K/uL — ABNORMAL HIGH (ref 1.7–7.7)
Neutrophils Relative %: 92 %
Platelets: 184 K/uL (ref 150–400)
RBC: 4.08 MIL/uL — ABNORMAL LOW (ref 4.22–5.81)
RDW: 14.1 % (ref 11.5–15.5)
WBC: 14.6 K/uL — ABNORMAL HIGH (ref 4.0–10.5)
nRBC: 0 % (ref 0.0–0.2)

## 2024-11-20 LAB — PROTIME-INR
INR: 1 (ref 0.8–1.2)
Prothrombin Time: 14.1 s (ref 11.4–15.2)

## 2024-11-20 LAB — COMPREHENSIVE METABOLIC PANEL WITH GFR
ALT: 12 U/L (ref 0–44)
AST: 23 U/L (ref 15–41)
Albumin: 4.1 g/dL (ref 3.5–5.0)
Alkaline Phosphatase: 122 U/L (ref 38–126)
Anion gap: 12 (ref 5–15)
BUN: 39 mg/dL — ABNORMAL HIGH (ref 8–23)
CO2: 25 mmol/L (ref 22–32)
Calcium: 9.4 mg/dL (ref 8.9–10.3)
Chloride: 94 mmol/L — ABNORMAL LOW (ref 98–111)
Creatinine, Ser: 1.2 mg/dL (ref 0.61–1.24)
GFR, Estimated: >60 mL/min
Glucose, Bld: 118 mg/dL — ABNORMAL HIGH (ref 70–99)
Potassium: 5.3 mmol/L — ABNORMAL HIGH (ref 3.5–5.1)
Sodium: 131 mmol/L — ABNORMAL LOW (ref 135–145)
Total Bilirubin: 0.5 mg/dL (ref 0.0–1.2)
Total Protein: 7.9 g/dL (ref 6.5–8.1)

## 2024-11-20 LAB — RESP PANEL BY RT-PCR (RSV, FLU A&B, COVID)  RVPGX2
Influenza A by PCR: NEGATIVE
Influenza B by PCR: NEGATIVE
Resp Syncytial Virus by PCR: NEGATIVE
SARS Coronavirus 2 by RT PCR: NEGATIVE

## 2024-11-20 LAB — I-STAT CG4 LACTIC ACID, ED: Lactic Acid, Venous: 1.4 mmol/L (ref 0.5–1.9)

## 2024-11-20 LAB — LACTIC ACID, PLASMA: Lactic Acid, Venous: 1.5 mmol/L (ref 0.5–1.9)

## 2024-11-20 LAB — APTT: aPTT: 28 s (ref 24–36)

## 2024-11-20 MED ORDER — SODIUM CHLORIDE 0.9 % IV SOLN
3.0000 g | Freq: Once | INTRAVENOUS | Status: AC
  Administered 2024-11-20: 3 g via INTRAVENOUS
  Filled 2024-11-20: qty 8

## 2024-11-20 MED ORDER — DM-GUAIFENESIN ER 30-600 MG PO TB12: 1.0000 | ORAL_TABLET | Freq: Two times a day (BID) | ORAL | Status: DC

## 2024-11-20 MED ORDER — ONDANSETRON HCL 4 MG PO TABS: 4.0000 mg | ORAL_TABLET | Freq: Four times a day (QID) | ORAL | Status: DC | PRN

## 2024-11-20 MED ORDER — LACTATED RINGERS IV SOLN
150.0000 mL/h | INTRAVENOUS | Status: DC
  Administered 2024-11-20 – 2024-11-21 (×2): 150 mL/h via INTRAVENOUS

## 2024-11-20 MED ORDER — ENOXAPARIN SODIUM 40 MG/0.4ML IJ SOSY
40.0000 mg | PREFILLED_SYRINGE | INTRAMUSCULAR | Status: DC
  Administered 2024-11-21 – 2024-11-22 (×2): 40 mg via SUBCUTANEOUS
  Filled 2024-11-20 (×2): qty 0.4

## 2024-11-20 MED ORDER — ACETAMINOPHEN 325 MG PO TABS: 650.0000 mg | ORAL_TABLET | Freq: Four times a day (QID) | ORAL | Status: DC | PRN

## 2024-11-20 MED ORDER — ONDANSETRON HCL 4 MG/2ML IJ SOLN
4.0000 mg | Freq: Four times a day (QID) | INTRAMUSCULAR | Status: DC | PRN
  Administered 2024-11-21 – 2024-11-22 (×2): 4 mg via INTRAVENOUS
  Filled 2024-11-20 (×3): qty 2

## 2024-11-20 MED ORDER — SODIUM CHLORIDE 0.9 % IV SOLN
500.0000 mg | INTRAVENOUS | Status: DC
  Administered 2024-11-20: 500 mg via INTRAVENOUS
  Filled 2024-11-20 (×2): qty 5

## 2024-11-20 MED ORDER — SODIUM CHLORIDE 0.9 % IV SOLN
2.0000 g | INTRAVENOUS | Status: DC
  Administered 2024-11-20: 2 g via INTRAVENOUS
  Filled 2024-11-20: qty 20

## 2024-11-20 MED ORDER — LACTATED RINGERS IV BOLUS (SEPSIS)
250.0000 mL | Freq: Once | INTRAVENOUS | Status: AC
  Administered 2024-11-20: 250 mL via INTRAVENOUS

## 2024-11-20 MED ORDER — LACTATED RINGERS IV BOLUS (SEPSIS)
1000.0000 mL | Freq: Once | INTRAVENOUS | Status: AC
  Administered 2024-11-20: 1000 mL via INTRAVENOUS

## 2024-11-20 MED ORDER — SODIUM CHLORIDE 0.9 % IV SOLN
3.0000 g | Freq: Four times a day (QID) | INTRAVENOUS | Status: DC
  Administered 2024-11-21 (×2): 3 g via INTRAVENOUS
  Filled 2024-11-20 (×3): qty 8

## 2024-11-20 MED ORDER — ACETAMINOPHEN 650 MG RE SUPP: 650.0000 mg | Freq: Four times a day (QID) | RECTAL | Status: DC | PRN

## 2024-11-20 NOTE — H&P (Incomplete)
 " History and Physical    Patient: Lee Barnes FMW:[id number] DOB: Nov 05, 1951 DOA: 11/20/2024 DOS: the patient was seen and examined on 11/20/2024 PCP: Joyce Norleen BROCKS, MD  Patient coming from: {Point_of_Origin:[address]}  Chief Complaint: No chief complaint on file.  HPI: Lee Barnes is a 74 y.o. male with medical history significant of ***  Review of Systems: {ROS_Text:26778} Past Medical History:  Diagnosis Date   Acute aspiration pneumonia (HCC) 11/13/2022   Allergy to environmental factors    Basal cell carcinoma of left cheek 07/17/2022   Complication of anesthesia    vageled after surgery -overnite stay   Diverticulosis 2008   Heart murmur    hx of in childhood    History of chemotherapy    History of radiation therapy 11/05/10-12/26/10   r base tongue, 7000 cGy 35 sessions   Hypertension    Medication started in May 2016.   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   Tinnitus    Past Surgical History:  Procedure Laterality Date   APPENDECTOMY     COLONOSCOPY     INGUINAL HERNIA REPAIR Bilateral 03/30/2015   Procedure: LAPAROSCOPIC BILATERAL INGUINAL HERNIA REPAIR WITH MESH;  Surgeon: Vicenta Poli, MD;  Location: [facility];  Service: General;  Laterality: Bilateral;   INGUINAL HERNIA REPAIR Right 11/18/1958   INGUINAL HERNIA REPAIR Bilateral 03/30/2015   INGUINAL HERNIA REPAIR Left 07/11/2015   INGUINAL HERNIA REPAIR N/A 07/11/2015   Procedure: REPAIR OF LEFT INGUINAL HERNIA WITH MESH;  Surgeon: Vicenta Poli, MD;  Location: [facility] OR;  Service: General;  Laterality: N/A;   INSERTION OF MESH N/A 07/11/2015   Procedure: INSERTION OF MESH;  Surgeon: Vicenta Poli, MD;  Location: [facility] OR;  Service: General;  Laterality: N/A;   IR GASTR TUBE CONVERT GASTR-JEJ PER W/FL MOD SED  08/02/2024   IR GJ TUBE CHANGE  05/03/2024   IR GJ TUBE CHANGE  10/17/2024   IR REPLACE G-TUBE SIMPLE WO FLUORO  07/31/2024    IR REPLC GASTRO/COLONIC TUBE PERCUT W/FLUORO  12/03/2021   IR REPLC GASTRO/COLONIC TUBE PERCUT W/FLUORO  03/19/2023   IR REPLC GASTRO/COLONIC TUBE PERCUT W/FLUORO  11/10/2023   IR REPLC GASTRO/COLONIC TUBE PERCUT W/FLUORO  03/05/2024   LUMBAR DISC SURGERY     PEG PLACEMENT N/A 03/22/2022   Procedure: PERCUTANEOUS ENDOSCOPIC GASTROSTOMY (PEG) REPLACEMENT;  Surgeon: Rollin Dover, MD;  Location: [facility];  Service: Gastroenterology;  Laterality: N/A;   SAVORY DILATION N/A 03/22/2022   Procedure: SAVORY DILATION;  Surgeon: Rollin Dover, MD;  Location: [facility];  Service: Gastroenterology;  Laterality: N/A;   SKIN BIOPSY Left 06/24/2022   basal cell carcinoma face cheek   TONSILLECTOMY     Social History:  reports that he has never smoked. He has never used smokeless tobacco. He reports that he does not currently use alcohol . He reports that he does not use drugs.  Allergies[1]  Family History  Problem Relation Age of Onset   Heart disease Father    Cancer Father        throat ca   Heart disease Mother    Colon cancer Neg Hx     Prior to Admission medications  Medication Sig Start Date End Date Taking? Authorizing Provider  acetaminophen  (TYLENOL ) 325 MG tablet Place 2 tablets (650 mg total) into feeding tube every 6 (six) hours as needed for mild pain (or Fever >/= 101).  02/05/23   Will [name], MD  albuterol  (PROVENTIL ) (2.5 MG/3ML) 0.083% nebulizer solution Take 3 mLs (2.5 mg total) by nebulization every 4 (four) hours as needed for wheezing or shortness of breath. 10/05/24   [name], [name], MD  amoxicillin -clavulanate (AUGMENTIN ) 875-125 MG tablet Take 1 tablet by mouth 2 (two) times daily. Patient not taking: Reported on 11/16/2024 10/13/24   Jha, Panav, MD  azelastine  (ASTELIN ) 0.1 % nasal spray Place 1 spray into both nostrils 2 (two) times daily. Use in each nostril as directed 10/13/24   Jha, Panav, MD  cetirizine (ZYRTEC) 10 MG chewable tablet Place 10 mg into  feeding tube daily as needed for allergies or rhinitis.    [provider]  diphenhydrAMINE  (BENADRYL ) 25 MG tablet Take 12.5 mg by mouth at bedtime as needed for allergies (to dry secretions).    [provider]  fluticasone  (FLONASE ) 50 MCG/ACT nasal spray SPRAY 2 SPRAYS IN EACH NOSTRIL ONCE DAILY 11/16/24   Joyce Norleen BROCKS, MD  guaiFENesin  (ROBITUSSIN) 100 MG/5ML liquid Place 30 mLs into feeding tube 2 (two) times daily. 04/09/24   Early, Sara E, NP  hydrALAZINE  (APRESOLINE ) 25 MG tablet Place 1 tablet (25 mg total) into feeding tube every 8 (eight) hours as needed (for SBP greater than 175). 02/05/23   Will [name], MD  midodrine  (PROAMATINE ) 5 MG tablet Take 5 mg by mouth 3 (three) times daily with meals as needed (low BP).    [provider]  Nutritional Supplements (FEEDING SUPPLEMENT, OSMOLITE 1.5 CAL,) LIQD Place 1,425 mLs into feeding tube daily. 10/16/22   Briana Elgin LABOR, MD  nystatin  (MYCOSTATIN ) 100000 UNIT/ML suspension Take 5 mLs (500,000 Units total) by mouth 4 (four) times daily. X 1 week. Swish and hold in mouth for at least 2 minutes and then spit Patient not taking: Reported on 11/16/2024 04/09/24   Early, Camie BRAVO, NP  ondansetron  (ZOFRAN ) 4 MG/5ML solution Place 4 mg into feeding tube every 8 (eight) hours as needed for nausea or vomiting. Patient not taking: Reported on 11/16/2024    [provider]  pantoprazole  sodium (PROTONIX ) 40 mg Place 40 mg into feeding tube daily. 04/09/24   Early, Sara E, NP  sodium chloride  (OCEAN) 0.65 % SOLN nasal spray Place 1 spray into both nostrils as needed for congestion. 12/30/22   Regalado, Belkys A, MD  sodium chloride  HYPERTONIC 3 % nebulizer solution Take 4 mLs by nebulization daily. 12/31/22   Madelyne Owen LABOR, MD    Physical Exam: Vitals:   11/20/24 1601 11/20/24 1739 11/20/24 1915 11/20/24 1930  BP:  (!) 146/83 (!) 81/58 (!) 83/54  Pulse:  (!) 112 96 76  Resp:  20  18  Temp:   98.8 F (37.1  C)   TempSrc:   Oral   SpO2: 96% 98% 94% 94%   *** Data Reviewed: {Tip this will not be part of the note when signed- Document your independent interpretation of telemetry tracing, EKG, lab, Radiology test or any other diagnostic tests. Add any new diagnostic test ordered today. (Optional):26781} {Results:26384}  Assessment and Plan: No notes have been filed under this hospital service. Service: Hospitalist     Advance Care Planning:   Code Status: Prior ***  Consults: ***  Family Communication: ***  Severity of Illness: {Observation/Inpatient:21159}  Author: Posey Maier, DO 11/20/2024 7:43 PM  For on call review [email or web address].     [1]  Allergies Allergen Reactions   Codeine Nausea And Vomiting   "

## 2024-11-20 NOTE — ED Notes (Signed)
Nurse notified of low blood pressure.

## 2024-11-20 NOTE — Sepsis Progress Note (Signed)
 Sepsis protocol monitored by eLink

## 2024-11-20 NOTE — H&P (Incomplete)
 " History and Physical    Patient: Lee Barnes FMW:[id number] DOB: [birth year] DOA: 11/20/2024 DOS: the patient was seen and examined on 11/20/2024 PCP: Joyce Norleen BROCKS, MD  Patient coming from: Home  Chief Complaint: No chief complaint on file.  HPI: Lee Barnes is a 74 y.o. 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  Apparently, patient has been having productive cough with some phlegm, he has home oxygen  which he uses as needed.  Patient endorsed having reflux yesterday.  Per wife at bedside, patient was noted with vomiting today and appears to have aspirated some stomach contents while vomiting as he had similar presentation before that resulted in recurrent aspiration pneumonia.  ED course In the emergency department, he was febrile, tachycardic and have leukocytosis.  Workup in the ED showed normocytic anemia and leukocytosis.  BMP showed sodium 131, potassium 5.3, chloride 94, bicarb 25, glucose 118, potassium 5.3.  Respiratory panel was negative. Chest x-ray showed mild right basilar opacity concerning for atelectasis or infiltrates He was treated with ceftriaxone  and azithromycin .  Unasyn  was added on realizing that patient may have aspirated.  IV hydration per sepsis protocol was provided.[facility]  [facility] was asked to admit patient    Review of Systems: As mentioned in the history of present illness. All other systems reviewed and are negative. Past Medical History:  Diagnosis Date   Acute aspiration pneumonia (HCC) 11/13/2022   Allergy to environmental factors    Basal cell carcinoma of left cheek 07/17/2022   Complication of anesthesia    vageled after surgery -overnite stay   Diverticulosis 2008   Heart murmur    hx of in  childhood    History of chemotherapy    History of radiation therapy 11/05/10-12/26/10   r base tongue, 7000 cGy 35 sessions   Hypertension    Medication started in May 2016.   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   Tinnitus    Past Surgical History:  Procedure Laterality Date   APPENDECTOMY     COLONOSCOPY     INGUINAL HERNIA REPAIR Bilateral 03/30/2015   Procedure: LAPAROSCOPIC BILATERAL INGUINAL HERNIA REPAIR WITH MESH;  Surgeon: Vicenta Poli, MD;  Location: [facility];  Service: General;  Laterality: Bilateral;   INGUINAL HERNIA REPAIR Right 11/18/1958   INGUINAL HERNIA REPAIR Bilateral 03/30/2015   INGUINAL HERNIA REPAIR Left 07/11/2015   INGUINAL HERNIA REPAIR N/A 07/11/2015   Procedure: REPAIR OF LEFT INGUINAL HERNIA WITH MESH;  Surgeon: Vicenta Poli, MD;  Location: [facility];  Service: General;  Laterality: N/A;   INSERTION OF MESH N/A 07/11/2015   Procedure: INSERTION OF MESH;  Surgeon: Vicenta Poli, MD;  Location: [facility];  Service: General;  Laterality: N/A;   IR GASTR TUBE CONVERT GASTR-JEJ PER W/FL MOD SED  08/02/2024   IR GJ TUBE CHANGE  05/03/2024   IR GJ TUBE CHANGE  10/17/2024   IR REPLACE G-TUBE SIMPLE WO FLUORO  07/31/2024   IR REPLC GASTRO/COLONIC TUBE PERCUT W/FLUORO  12/03/2021   IR REPLC GASTRO/COLONIC TUBE PERCUT W/FLUORO  03/19/2023   IR REPLC GASTRO/COLONIC TUBE PERCUT W/FLUORO  11/10/2023   IR REPLC GASTRO/COLONIC TUBE PERCUT W/FLUORO  03/05/2024  LUMBAR DISC SURGERY     PEG PLACEMENT N/A 03/22/2022   Procedure: PERCUTANEOUS ENDOSCOPIC GASTROSTOMY (PEG) REPLACEMENT;  Surgeon: Rollin Dover, MD;  Location: [facility];  Service: Gastroenterology;  Laterality: N/A;   SAVORY DILATION N/A 03/22/2022   Procedure: SAVORY DILATION;  Surgeon: Rollin Dover, MD;  Location: [facility];  Service: Gastroenterology;  Laterality: N/A;   SKIN BIOPSY Left 06/24/2022    basal cell carcinoma face cheek   TONSILLECTOMY     Social History:  reports that he has never smoked. He has never used smokeless tobacco. He reports that he does not currently use alcohol . He reports that he does not use drugs.  Allergies[1]  Family History  Problem Relation Age of Onset   Heart disease Father    Cancer Father        throat ca   Heart disease Mother    Colon cancer Neg Hx     Prior to Admission medications  Medication Sig Start Date End Date Taking? Authorizing Provider  acetaminophen  (TYLENOL ) 325 MG tablet Place 2 tablets (650 mg total) into feeding tube every 6 (six) hours as needed for mild pain (or Fever >/= 101). 02/05/23   Will [name], MD  albuterol  (PROVENTIL ) (2.5 MG/3ML) 0.083% nebulizer solution Take 3 mLs (2.5 mg total) by nebulization every 4 (four) hours as needed for wheezing or shortness of breath. 10/05/24   [name], [name], MD  amoxicillin -clavulanate (AUGMENTIN ) 875-125 MG tablet Take 1 tablet by mouth 2 (two) times daily. Patient not taking: Reported on 11/16/2024 10/13/24   Jha, Panav, MD  azelastine  (ASTELIN ) 0.1 % nasal spray Place 1 spray into both nostrils 2 (two) times daily. Use in each nostril as directed 10/13/24   Jha, Panav, MD  cetirizine (ZYRTEC) 10 MG chewable tablet Place 10 mg into feeding tube daily as needed for allergies or rhinitis.    [provider]  diphenhydrAMINE  (BENADRYL ) 25 MG tablet Take 12.5 mg by mouth at bedtime as needed for allergies (to dry secretions).    [provider]  fluticasone  (FLONASE ) 50 MCG/ACT nasal spray SPRAY 2 SPRAYS IN EACH NOSTRIL ONCE DAILY 11/16/24   [name], [name], MD  guaiFENesin  (ROBITUSSIN) 100 MG/5ML liquid Place 30 mLs into feeding tube 2 (two) times daily. 04/09/24   Early, Sara E, NP  hydrALAZINE  (APRESOLINE ) 25 MG tablet Place 1 tablet (25 mg total) into feeding tube every 8 (eight) hours as needed (for SBP greater than 175). 02/05/23   Will [name],  MD  midodrine  (PROAMATINE ) 5 MG tablet Take 5 mg by mouth 3 (three) times daily with meals as needed (low BP).    [provider]  Nutritional Supplements (FEEDING SUPPLEMENT, OSMOLITE 1.5 CAL,) LIQD Place 1,425 mLs into feeding tube daily. 10/16/22   Briana Elgin LABOR, MD  nystatin  (MYCOSTATIN ) 100000 UNIT/ML suspension Take 5 mLs (500,000 Units total) by mouth 4 (four) times daily. X 1 week. Swish and hold in mouth for at least 2 minutes and then spit Patient not taking: Reported on 11/16/2024 04/09/24   Early, Camie BRAVO, NP  ondansetron  (ZOFRAN ) 4 MG/5ML solution Place 4 mg into feeding tube every 8 (eight) hours as needed for nausea or vomiting. Patient not taking: Reported on 11/16/2024    [provider]  pantoprazole  sodium (PROTONIX ) 40 mg Place 40 mg into feeding tube daily. 04/09/24   Early, Sara E, NP  sodium chloride  (OCEAN) 0.65 % SOLN nasal spray Place 1 spray into both nostrils as needed for congestion.  12/30/22   Regalado, Belkys A, MD  sodium chloride  HYPERTONIC 3 % nebulizer solution Take 4 mLs by nebulization daily. 12/31/22   Madelyne Owen LABOR, MD    Physical Exam: Vitals:   11/20/24 1601 11/20/24 1739 11/20/24 1915 11/20/24 1930  BP:  (!) 146/83 (!) 81/58 (!) 83/54  Pulse:  (!) 112 96 76  Resp:  20  18  Temp:   98.8 F (37.1 C)   TempSrc:   Oral   SpO2: 96% 98% 94% 94%   General: Elderly male.  Ill appearing, awake and alert and oriented x3. Not in any acute distress.  HEENT: NCAT.  PERRLA. EOMI. Sclerae anicteric.  Moist mucosal membranes. Neck: Neck supple without lymphadenopathy. No carotid bruits. No masses palpated.  Cardiovascular: Regular rate with normal S1-S2 sounds. No murmurs, rubs or gallops auscultated. No JVD.  Respiratory: Mild rhonchi in the right lower lobe. No accessory muscle use. Abdomen: Soft, nontender G tube with nosurrounding drainage/erythema.  Nontender, nondistended. Active bowel sounds. No masses or hepatosplenomegaly  Skin: No  rashes, lesions, or ulcerations.  Dry, warm to touch. Musculoskeletal:  2+ dorsalis pedis and radial pulses. Good ROM.  No contractures  Psychiatric: Intact judgment and insight.  Mood appropriate to current condition. Neurologic: No focal neurological deficits. Strength is 5/5 x 4.  CN II - XII grossly intact.  Assessment and Plan: Sepsis due to pneumonia Patient met sepsis criteria due to being febrile, tachycardic and leukocytosis (met SIRS criteria).  EKG was suggestive of aspiration pneumonia Patient was started on Unasyn  and azithromycin , we shall continue same at this time with plan to de-escalate/discontinue based on blood culture, sputum culture, urine Legionella and strep pneumo   Continue Tylenol  as needed Continue Mucinex , incentive spirometry, flutter valve   Vomiting Continue Zofran  as needed  Hyperkalemia K+ 5.3, IV hydration was provided Repeat BMP to ensure current K+ level and manage accordingly  Feeding tube dependent Continue feeding tube per home regimen Dietitian will be consulted  GERD Continue Protonix   Hypotension Patient's BP intermittently ranged within hypotensive range Continue midodrine  per home regimen    Advance Care Planning: Full code  Consults: Dietitian  Family Communication: Wife at bedside  Severity of Illness: The appropriate patient status for this patient is INPATIENT. Inpatient status is judged to be reasonable and necessary in order to provide the required intensity of service to ensure the patient's safety. The patient's presenting symptoms, physical exam findings, and initial radiographic and laboratory data in the context of their chronic comorbidities is felt to place them at high risk for further clinical deterioration. Furthermore, it is not anticipated that the patient will be medically stable for discharge from the hospital within 2 midnights of admission.   * I certify that at the point of admission it is my clinical judgment  that the patient will require inpatient hospital care spanning beyond 2 midnights from the point of admission due to high intensity of service, high risk for further deterioration and high frequency of surveillance required.*  Author: Carrianne Hyun, DO 11/20/2024 7:43 PM  For on call review [email or web address].        [1] Allergies Allergen Reactions   Codeine Nausea And Vomiting  "

## 2024-11-20 NOTE — ED Notes (Signed)
 RM 7 BP 81/58

## 2024-11-20 NOTE — ED Triage Notes (Signed)
 Patient BIB EMS from home. Hx of Throat Cancer, has g tube. Patients pale in color, short of breath per wife. Patient has oxygen  at home PRN. Hx of frequent Aspiration Pneumonia. Patient is alert and oriented x 4. Productive cough. Patient coughs up mucus and vomits per EMS.    Per EMS: 120 210/100 94% 4 Liters CBG 127 T 97.8  22g R Hand  LR bolus 4mg  Zofran  given en route

## 2024-11-20 NOTE — ED Provider Notes (Signed)
 " [place] EMERGENCY DEPARTMENT AT [facility] Provider Note   CSN: [id number] Arrival date & time: 11/20/24  1544     Patient presents with: No chief complaint on file.   Lee Barnes is a 74 y.o. male.   Patient has a past medical history of throat cancer.  Patient has a G-tube.  Patient states that he has been experiencing some nausea he has had some phlegm that he has been coughing up.  Patient became short of breath today.  Patient has home O2 but he has not been using.  Patient has increasing shortness of breath today.  Patient has a past medical history of pneumonia.Pt has a history of glottic stenosis. Pt's wife reports she is concerned that pt may have aspirated. Pt has had a fever.   The history is provided by the patient and the spouse. No language interpreter was used.       Prior to Admission medications  Medication Sig Start Date End Date Taking? Authorizing Provider  acetaminophen  (TYLENOL ) 325 MG tablet Place 2 tablets (650 mg total) into feeding tube every 6 (six) hours as needed for mild pain (or Fever >/= 101). 02/05/23   Will [name], MD  albuterol  (PROVENTIL ) (2.5 MG/3ML) 0.083% nebulizer solution Take 3 mLs (2.5 mg total) by nebulization every 4 (four) hours as needed for wheezing or shortness of breath. 10/05/24   [name], [name], MD  amoxicillin -clavulanate (AUGMENTIN ) 875-125 MG tablet Take 1 tablet by mouth 2 (two) times daily. Patient not taking: Reported on 11/16/2024 10/13/24   Jha, Panav, MD  azelastine  (ASTELIN ) 0.1 % nasal spray Place 1 spray into both nostrils 2 (two) times daily. Use in each nostril as directed 10/13/24   Jha, Panav, MD  cetirizine (ZYRTEC) 10 MG chewable tablet Place 10 mg into feeding tube daily as needed for allergies or rhinitis.    [provider]  diphenhydrAMINE  (BENADRYL ) 25 MG tablet Take 12.5 mg by mouth at bedtime as needed for allergies (to dry secretions).    [provider]  fluticasone   (FLONASE ) 50 MCG/ACT nasal spray SPRAY 2 SPRAYS IN EACH NOSTRIL ONCE DAILY 11/16/24   [name], [name], MD  guaiFENesin  (ROBITUSSIN) 100 MG/5ML liquid Place 30 mLs into feeding tube 2 (two) times daily. 04/09/24   Early, Sara E, NP  hydrALAZINE  (APRESOLINE ) 25 MG tablet Place 1 tablet (25 mg total) into feeding tube every 8 (eight) hours as needed (for SBP greater than 175). 02/05/23   Will [name], MD  midodrine  (PROAMATINE ) 5 MG tablet Take 5 mg by mouth 3 (three) times daily with meals as needed (low BP).    [provider]  Nutritional Supplements (FEEDING SUPPLEMENT, OSMOLITE 1.5 CAL,) LIQD Place 1,425 mLs into feeding tube daily. 10/16/22   Briana Elgin DELENA, MD  nystatin  (MYCOSTATIN ) 100000 UNIT/ML suspension Take 5 mLs (500,000 Units total) by mouth 4 (four) times daily. X 1 week. Swish and hold in mouth for at least 2 minutes and then spit Patient not taking: Reported on 11/16/2024 04/09/24   Early, Camie BRAVO, NP  ondansetron  (ZOFRAN ) 4 MG/5ML solution Place 4 mg into feeding tube every 8 (eight) hours as needed for nausea or vomiting. Patient not taking: Reported on 11/16/2024    [provider]  pantoprazole  sodium (PROTONIX ) 40 mg Place 40 mg into feeding tube daily. 04/09/24   Early, Sara E, NP  sodium chloride  (OCEAN) 0.65 % SOLN nasal spray Place 1 spray into both nostrils as needed  for congestion. 12/30/22   Regalado, Belkys A, MD  sodium chloride  HYPERTONIC 3 % nebulizer solution Take 4 mLs by nebulization daily. 12/31/22   Regalado, Owen A, MD    Allergies: Codeine    Review of Systems  All other systems reviewed and are negative.   Updated Vital Signs There were no vitals taken for this visit.  Physical Exam Vitals reviewed.  Constitutional:      Appearance: He is ill-appearing.     Comments: Thin, frail appearing  HENT:     Head: Normocephalic.     Right Ear: External ear normal.     Left Ear: External ear normal.     Nose: Nose normal.  Eyes:      Extraocular Movements: Extraocular movements intact.     Pupils: Pupils are equal, round, and reactive to light.  Cardiovascular:     Rate and Rhythm: Tachycardia present.  Pulmonary:     Effort: Pulmonary effort is normal.     Breath sounds: Rhonchi present.  Abdominal:     General: Abdomen is flat.  Musculoskeletal:        General: Normal range of motion.  Skin:    General: Skin is warm.  Neurological:     General: No focal deficit present.     Mental Status: He is alert.  Psychiatric:        Mood and Affect: Mood normal.     (all labs ordered are listed, but only abnormal results are displayed) Labs Reviewed  CBC WITH DIFFERENTIAL/PLATELET - Abnormal; Notable for the following components:      Result Value   WBC 14.6 (*)    RBC 4.08 (*)    Hemoglobin 12.1 (*)    HCT 35.7 (*)    Neutro Abs 13.4 (*)    Lymphs Abs 0.2 (*)    All other components within normal limits  COMPREHENSIVE METABOLIC PANEL WITH GFR - Abnormal; Notable for the following components:   Sodium 131 (*)    Potassium 5.3 (*)    Chloride 94 (*)    Glucose, Bld 118 (*)    BUN 39 (*)    All other components within normal limits  CULTURE, BLOOD (ROUTINE X 2)  CULTURE, BLOOD (ROUTINE X 2)  RESP PANEL BY RT-PCR (RSV, FLU A&B, COVID)  RVPGX2  PROTIME-INR  APTT  LACTIC ACID, PLASMA  I-STAT CG4 LACTIC ACID, ED    EKG: None  Radiology: [facility] Chest Port 1 View Result Date: 11/20/2024 CLINICAL DATA:  Weakness, chills, productive cough EXAM: PORTABLE CHEST 1 VIEW COMPARISON:  Apr 09, 2024. FINDINGS: Stable cardiomediastinal silhouette. Left lung is clear. Stable elevated right hemidiaphragm. Mild right basilar opacity is noted concerning for atelectasis or infiltrate. Bony thorax is unremarkable. IMPRESSION: Mild right basilar opacity is noted concerning for atelectasis or infiltrate. Electronically Signed   By: Lynwood Landy Raddle M.D.   On: 11/20/2024 16:49     .Critical Care  Performed by: Flint Sonny POUR,  PA-C Authorized by: Flint Sonny POUR, PA-C   Critical care provider statement:    Critical care time (minutes):  45   Critical care start time:  11/20/2024 4:00 PM   Critical care end time:  11/20/2024 7:53 PM   Critical care time was exclusive of:  Separately billable procedures and treating other patients and teaching time   Critical care was time spent personally by me on the following activities:  Blood draw for specimens, development of treatment plan with patient or surrogate, discussions with  consultants and examination of patient   I assumed direction of critical care for this patient from another provider in my specialty: no     Care discussed with: admitting provider      Medications Ordered in the ED - No data to display                                  Medical Decision Making Patient complains of shortness of breath that started today.  Amount and/or Complexity of Data Reviewed Independent Historian: spouse    Details: Patient's wife reports patient has had a cough and has been coughing up phlegm.  She reports he sounds like he has been choking on the phlegm.  Patient has had aspiration in the past. Labs: ordered. Decision-making details documented in ED Course.    Details: Labs ordered reviewed and interpreted.  Patient has an elevated white blood cell count of 14.6.  Hemoglobin is 12.1.  Sodium is 131 potassium is 5.3.  BUN is elevated at 39 Radiology: ordered and independent interpretation performed. Decision-making details documented in ED Course.    Details: Wife chest x-ray ordered reviewed and interpreted.  Chest x-ray shows mild right basilar opacity concerning for infiltrate. Discussion of management or test interpretation with external provider(s): Dr. Manfred hospitalist will see patient for admission.  Risk Prescription drug management. Decision regarding hospitalization. Risk Details: Patient reevaluated after IV fluids and antibiotics.  He feels much improved he  states he feels like he is breathing better..  Patient has had a decreased blood pressure.  Patient's wife states patient has frequent pressure changes.  Patient takes midodrine  at home.  Patient's MAP is stable at 69.   Critical Care Total time providing critical care: 45 minutes  He was really not       Final diagnoses:  Pneumonia due to infectious organism, unspecified laterality, unspecified part of lung  Sepsis with acute hypoxic respiratory failure without septic shock, due to unspecified organism [facility])    ED Discharge Orders     None          Flint Sonny POUR, [place] 11/20/24 1954  "

## 2024-11-21 ENCOUNTER — Other Ambulatory Visit: Payer: Self-pay

## 2024-11-21 ENCOUNTER — Encounter (HOSPITAL_COMMUNITY): Payer: Self-pay | Admitting: Internal Medicine

## 2024-11-21 DIAGNOSIS — Z789 Other specified health status: Secondary | ICD-10-CM | POA: Diagnosis not present

## 2024-11-21 DIAGNOSIS — E871 Hypo-osmolality and hyponatremia: Secondary | ICD-10-CM

## 2024-11-21 DIAGNOSIS — G903 Multi-system degeneration of the autonomic nervous system: Secondary | ICD-10-CM | POA: Diagnosis not present

## 2024-11-21 DIAGNOSIS — A419 Sepsis, unspecified organism: Secondary | ICD-10-CM | POA: Diagnosis not present

## 2024-11-21 DIAGNOSIS — J189 Pneumonia, unspecified organism: Secondary | ICD-10-CM | POA: Diagnosis not present

## 2024-11-21 DIAGNOSIS — J690 Pneumonitis due to inhalation of food and vomit: Secondary | ICD-10-CM

## 2024-11-21 DIAGNOSIS — J9601 Acute respiratory failure with hypoxia: Secondary | ICD-10-CM

## 2024-11-21 DIAGNOSIS — D649 Anemia, unspecified: Secondary | ICD-10-CM

## 2024-11-21 DIAGNOSIS — E875 Hyperkalemia: Secondary | ICD-10-CM | POA: Diagnosis not present

## 2024-11-21 LAB — COMPREHENSIVE METABOLIC PANEL WITH GFR
ALT: 10 U/L (ref 0–44)
AST: 19 U/L (ref 15–41)
Albumin: 3.4 g/dL — ABNORMAL LOW (ref 3.5–5.0)
Alkaline Phosphatase: 93 U/L (ref 38–126)
Anion gap: 6 (ref 5–15)
BUN: 30 mg/dL — ABNORMAL HIGH (ref 8–23)
CO2: 30 mmol/L (ref 22–32)
Calcium: 9.2 mg/dL (ref 8.9–10.3)
Chloride: 98 mmol/L (ref 98–111)
Creatinine, Ser: 1.17 mg/dL (ref 0.61–1.24)
GFR, Estimated: >60 mL/min
Glucose, Bld: 92 mg/dL (ref 70–99)
Potassium: 5.3 mmol/L — ABNORMAL HIGH (ref 3.5–5.1)
Sodium: 134 mmol/L — ABNORMAL LOW (ref 135–145)
Total Bilirubin: 0.5 mg/dL (ref 0.0–1.2)
Total Protein: 6.6 g/dL (ref 6.5–8.1)

## 2024-11-21 LAB — STREP PNEUMONIAE URINARY ANTIGEN: Strep Pneumo Urinary Antigen: NEGATIVE

## 2024-11-21 LAB — CBC
HCT: 29.8 % — ABNORMAL LOW (ref 39.0–52.0)
Hemoglobin: 9.7 g/dL — ABNORMAL LOW (ref 13.0–17.0)
MCH: 29.2 pg (ref 26.0–34.0)
MCHC: 32.6 g/dL (ref 30.0–36.0)
MCV: 89.8 fL (ref 80.0–100.0)
Platelets: 154 K/uL (ref 150–400)
RBC: 3.32 MIL/uL — ABNORMAL LOW (ref 4.22–5.81)
RDW: 14.3 % (ref 11.5–15.5)
WBC: 11.6 K/uL — ABNORMAL HIGH (ref 4.0–10.5)
nRBC: 0 % (ref 0.0–0.2)

## 2024-11-21 LAB — IRON AND TIBC
Iron: 19 ug/dL — ABNORMAL LOW (ref 45–182)
Saturation Ratios: 6 % — ABNORMAL LOW (ref 17.9–39.5)
TIBC: 304 ug/dL (ref 250–450)
UIBC: 285 ug/dL

## 2024-11-21 LAB — EXPECTORATED SPUTUM ASSESSMENT W GRAM STAIN, RFLX TO RESP C

## 2024-11-21 LAB — MAGNESIUM: Magnesium: 2 mg/dL (ref 1.7–2.4)

## 2024-11-21 LAB — PHOSPHORUS: Phosphorus: 2.6 mg/dL (ref 2.5–4.6)

## 2024-11-21 LAB — HIV ANTIBODY (ROUTINE TESTING W REFLEX): HIV Screen 4th Generation wRfx: NONREACTIVE

## 2024-11-21 MED ORDER — FLUTICASONE PROPIONATE 50 MCG/ACT NA SUSP
2.0000 | Freq: Every day | NASAL | Status: DC
  Administered 2024-11-21 – 2024-11-22 (×2): 2 via NASAL
  Filled 2024-11-21: qty 16

## 2024-11-21 MED ORDER — OSMOLITE 1.5 CAL PO LIQD
1425.0000 mL | ORAL | Status: DC
  Filled 2024-11-21: qty 1659

## 2024-11-21 MED ORDER — METRONIDAZOLE 500 MG PO TABS
500.0000 mg | ORAL_TABLET | Freq: Two times a day (BID) | ORAL | Status: DC
  Administered 2024-11-21 – 2024-11-22 (×3): 500 mg
  Filled 2024-11-21 (×3): qty 1

## 2024-11-21 MED ORDER — PANTOPRAZOLE SODIUM 40 MG PO PACK: 40.0000 mg | PACK | Freq: Every day | ORAL | Status: DC

## 2024-11-21 MED ORDER — MIDODRINE HCL 5 MG PO TABS: 5.0000 mg | ORAL_TABLET | Freq: Three times a day (TID) | ORAL | Status: DC | PRN

## 2024-11-21 MED ORDER — HYDRALAZINE HCL 20 MG/ML IJ SOLN
10.0000 mg | Freq: Four times a day (QID) | INTRAMUSCULAR | Status: DC | PRN
  Administered 2024-11-21 – 2024-11-22 (×2): 10 mg via INTRAVENOUS
  Filled 2024-11-21 (×2): qty 1

## 2024-11-21 MED ORDER — SODIUM CHLORIDE 0.9 % IV SOLN
2.0000 g | INTRAVENOUS | Status: DC
  Administered 2024-11-21 – 2024-11-22 (×2): 2 g via INTRAVENOUS
  Filled 2024-11-21 (×2): qty 20

## 2024-11-21 MED ORDER — GUAIFENESIN-DM 100-10 MG/5ML PO SYRP
5.0000 mL | ORAL_SOLUTION | Freq: Four times a day (QID) | ORAL | Status: DC | PRN
  Administered 2024-11-21 (×2): 5 mL
  Filled 2024-11-21 (×2): qty 10

## 2024-11-21 MED ORDER — SODIUM CHLORIDE 0.9 % IV SOLN: 2.0000 g | INTRAVENOUS | Status: DC

## 2024-11-21 MED ORDER — OSMOLITE 1.5 CAL PO LIQD
1000.0000 mL | ORAL | Status: DC
  Administered 2024-11-22: 1000 mL
  Filled 2024-11-21 (×3): qty 1000

## 2024-11-21 MED ORDER — PANTOPRAZOLE SODIUM 40 MG IV SOLR
40.0000 mg | INTRAVENOUS | Status: DC
  Administered 2024-11-21: 40 mg via INTRAVENOUS
  Filled 2024-11-21: qty 10

## 2024-11-21 MED ORDER — METRONIDAZOLE 50 MG/ML ORAL SUSPENSION: 500.0000 mg | Freq: Two times a day (BID) | ORAL | Status: DC

## 2024-11-21 NOTE — Plan of Care (Signed)
" °  Problem: Fluid Volume: Goal: Hemodynamic stability will improve Outcome: Progressing   Problem: Clinical Measurements: Goal: Ability to maintain clinical measurements within normal limits will improve Outcome: Progressing   Problem: Activity: Goal: Ability to tolerate increased activity will improve Outcome: Progressing   Problem: Respiratory: Goal: Ability to maintain adequate ventilation will improve Outcome: Progressing   "

## 2024-11-21 NOTE — Evaluation (Signed)
 Physical Therapy Evaluation Patient Details Name: Lee Barnes MRN: [id number] DOB: 1951-06-24 Today's Date: 11/21/2024  History of Present Illness  74 y.o. 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 Chest x-ray showed mild right basilar opacity concerning for atelectasis or infiltrates  Clinical Impression  Patient evaluated by Physical Therapy with no further acute PT needs identified. All education has been completed and the patient has no further questions.  Pt known me from prior admission. Pt reports feeling better, hoping to be able to go home soon. Pt wife present for session. Pt is typically independent, ambulates without device. Amb ~ 260' today, Independent to mod I (holding IV pole at times), no LOB, gait velocity WFL/WNL. SpO2=93% at rest on RA, 93-94% with activity on RA. RN updated. No further PT needs.   See below for any follow-up Physical Therapy or equipment needs. PT is signing off. Thank you for this referral.         If plan is discharge home, recommend the following:     Can travel by private vehicle        Equipment Recommendations None recommended by PT  Recommendations for Other Services       Functional Status Assessment Patient has not had a recent decline in their functional status     Precautions / Restrictions Precautions Precautions: Fall Precaution/Restrictions Comments: G-tube Restrictions Weight Bearing Restrictions Per Provider Order: No      Mobility  Bed Mobility Overal bed mobility: Independent                  Transfers Overall transfer level: Independent                      Ambulation/Gait Ambulation/Gait assistance: Modified independent (Device/Increase time), Independent Gait Distance (Feet): 260  Feet Assistive device: IV Pole, None Gait Pattern/deviations: Step-through pattern Gait velocity: WFL     General Gait Details: no LOB, no physical assist. SpO2=94% on RA  Stairs            Wheelchair Mobility     Tilt Bed    Modified Rankin (Stroke Patients Only)       Balance Overall balance assessment: Needs assistance (denies falls) Sitting-balance support: Feet supported, No upper extremity supported Sitting balance-Leahy Scale: Normal     Standing balance support: During functional activity, No upper extremity supported, Single extremity supported Standing balance-Leahy Scale: Good Standing balance comment: Fair to Good, NT to moderate challenges.             High level balance activites: Direction changes, Turns, Head turns High Level Balance Comments: no LOB with above, amb over level, smooth surface             Pertinent Vitals/Pain Pain Assessment Pain Assessment: No/denies pain    Home Living Family/patient expects to be discharged to:: Private residence Living Arrangements: Spouse/significant other Available Help at Discharge: Family Type of Home: House Home Access: Stairs to enter Entrance Stairs-Rails: Right Entrance Stairs-Number of Steps: 4   Home Layout: Two level;Able to live on main level with bedroom/bathroom Home Equipment: Shower seat - built in      Prior Function Prior Level of Function : Independent/Modified Independent  Extremity/Trunk Assessment   Upper Extremity Assessment Upper Extremity Assessment: Defer to OT evaluation;Overall [facility] for tasks assessed    Lower Extremity Assessment Lower Extremity Assessment: Overall WFL for tasks assessed    Cervical / Trunk Assessment Cervical / Trunk Assessment: Normal  Communication        Cognition Arousal: Alert Behavior During Therapy: WFL for tasks assessed/performed   PT - Cognitive impairments: No apparent impairments                          Following commands: Intact       Cueing Cueing Techniques: Verbal cues     General Comments      Exercises     Assessment/Plan    PT Assessment Patient needs continued PT services  PT Problem List         PT Treatment Interventions      PT Goals (Current goals can be found in the Care Plan section)  Acute Rehab PT Goals PT Goal Formulation: All assessment and education complete, DC therapy    Frequency       Co-evaluation               AM-PAC PT 6 Clicks Mobility  Outcome Measure Help needed turning from your back to your side while in a flat bed without using bedrails?: None Help needed moving from lying on your back to sitting on the side of a flat bed without using bedrails?: None Help needed moving to and from a bed to a chair (including a wheelchair)?: None Help needed standing up from a chair using your arms (e.g., wheelchair or bedside chair)?: None Help needed to walk in hospital room?: None Help needed climbing 3-5 steps with a railing? : None 6 Click Score: 24    End of Session Equipment Utilized During Treatment: Gait belt Activity Tolerance: Patient tolerated treatment well Patient left: with call bell/phone within reach;in bed;with family/visitor present Nurse Communication: Mobility status PT Visit Diagnosis: Other abnormalities of gait and mobility (R26.89)    Time: 1337-1400 PT Time Calculation (min) (ACUTE ONLY): 23 min   Charges:   PT Evaluation $PT Eval Low Complexity: 1 Low   PT General Charges $$ ACUTE PT VISIT: 1 Visit         Hellena Pridgen, PT  Acute Rehab Dept ([facility]) [phone]  11/21/2024   [facility] 11/21/2024, 2:16 PM

## 2024-11-21 NOTE — Assessment & Plan Note (Addendum)
 11/21/2024 sepsis without acute organ dysfunction. Criteria of WBC 14.6, temp 100.8, HR 115.

## 2024-11-21 NOTE — Progress Notes (Signed)
 " PROGRESS NOTE    Lee Barnes  FMW:[id number] DOB: 1951/11/06 DOA: 11/20/2024 PCP: Joyce Norleen BROCKS, MD  Subjective: Pt seen and examined. Met with pt and wife Lee. Introduced Hospital at [facility] program. Discussed benefits and participation requirements.  Opportunity for pt and wife to asked questions. Both pt and wife want to enroll. Wife is at home full time with pt. He is strictly NPO. He has G-tube and is strictly NPO. Pt has all equipment at home including tube feed pumps, suction machine, nebulizer machine, home O2 concentrator.  PT/OT consult is pending.   Hospital Course: CC: cough, fever HPI: Lee Barnes is a 74 y.o. 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  Apparently, patient has been having productive cough with some phlegm, he has home oxygen  which he uses as needed.  Patient endorsed having reflux yesterday.  Per wife at bedside, patient was noted with vomiting today and appears to have aspirated some stomach contents while vomiting as he had similar presentation before that resulted in recurrent aspiration pneumonia.   ED course In the emergency department, he was febrile, tachycardic and have leukocytosis.  Workup in the ED showed normocytic anemia and leukocytosis.  BMP showed sodium 131, potassium 5.3, chloride 94, bicarb 25, glucose 118, potassium 5.3.  Respiratory panel was negative. Chest x-ray showed mild right basilar opacity concerning for atelectasis or infiltrates He was treated with ceftriaxone  and azithromycin .  Unasyn  was added on realizing that patient may have aspirated.  IV hydration per sepsis protocol was provided.[facility] was asked to admit patient  Significant Events: Admitted 11/20/2024 for aspiration  pneumonia. 11-22-2023 consented for Hospital at Home pending PT/OT assessment  Admission Labs: WBC 14.6, HgB 12.1, plt 184 Lactic acid 1.4 Na 131, K 5.3, CO2 of 25, BUN 39, Scr 1.20, glu 118, Ca 9.4 T. Prot 7.9, alb 4.1, AST 23, ALT 12, alk phos 122, T. Bili 0.5 Covid/flu/RSV negative  Admission Imaging Studies: CXR Mild right basilar opacity is noted concerning for atelectasis or infiltrate.  Significant Labs: Strep Pneumo antigen Negative  Significant Imaging Studies:   Antibiotic Therapy: Anti-infectives (From admission, onward)    Start     Dose/Rate Route Frequency Ordered Stop   11/21/24 0200  Ampicillin -Sulbactam (UNASYN ) 3 g in sodium chloride  0.9 % 100 mL IVPB        3 g 200 mL/hr over 30 Minutes Intravenous Every 6 hours 11/20/24 2357     11/20/24 1945  Ampicillin -Sulbactam (UNASYN ) 3 g in sodium chloride  0.9 % 100 mL IVPB        3 g 200 mL/hr over 30 Minutes Intravenous  Once 11/20/24 1939 11/20/24 2038   11/20/24 1615  cefTRIAXone  (ROCEPHIN ) 2 g in sodium chloride  0.9 % 100 mL IVPB  Status:  Discontinued        2 g 200 mL/hr over 30 Minutes Intravenous Every 24 hours 11/20/24 1607 11/21/24 0812   11/20/24 1615  azithromycin  (ZITHROMAX ) 500 mg in sodium chloride  0.9 % 250 mL IVPB        500 mg 250 mL/hr over 60 Minutes Intravenous Every 24 hours 11/20/24 1607 11/25/24 1614       Procedures:   Consultants:     Assessment and Plan: * Sepsis due to pneumonia (HCC) 11/21/2024  sepsis without acute organ dysfunction. Criteria of WBC 14.6, temp 100.8, HR 115.    Aspiration pneumonia (HCC) 11/21/2024 initially placed on Unasyn  and zithromax . Given his hx of aspiration, will consolidate treatment to IV Rocephin  and liquid flagyl (for g-tube administration). Continue with prn nebs   Hyperkalemia 11/21/2024 K 5.3 today. Will stop lovenox . Continue with free H2O via g-tube. Repeat BMP in AM.   Acute respiratory failure with hypoxia (HCC) 11/21/2024 normally on RA. Has  O2 if needed. Continue with supplemental O2. Pt has home O2 concentrator.   Normocytic anemia 11/21/2024 check TIBC, Fe, %sat. No bleeding noted. Admit HgB 12.1 g/dl. HgB of 9.7. anemia may be dilutional from IVF.    On tube feeding diet 11/21/2024 chronic. 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 Pt has tube feed pump at home. Wife states she does not need hospital supplies. She has all the tube feedings she needs for him at home.   Chronic hyponatremia 11/21/2024 chronic. Na 134 today.   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       DVT prophylaxis: enoxaparin  (LOVENOX ) injection 40 mg Start: 11/21/24 1000 SCDs Start: 11/20/24 2327    Code Status: Do not attempt resuscitation (DNR) - Comfort care Family Communication: discussed with pt and wife Lee at bedside Disposition Plan: home Reason for continuing need for hospitalization: remains on IV ABX, supplemental O2.  Objective: Vitals:   11/20/24 2115 11/20/24 2200 11/21/24 0210 11/21/24 0604  BP: 124/68 (!) 150/72 (!) 150/91 125/81  Pulse: 86 88 81 80  Resp:  18 15 17   Temp:  98.7 F (37.1 C) 98.1 F (36.7 C) 98.4 F (36.9 C)  TempSrc:  Oral Oral Oral  SpO2: 97% 98% 94% 94%  Weight:  74.4 kg    Height:  6' (1.829 m)      Intake/Output Summary (Last 24 hours) at 11/21/2024 1215 Last data filed at 11/21/2024 1119 Gross per 24 hour  Intake 2762.04 ml  Output 1576 ml  Net 1186.04 ml   Filed Weights   11/20/24 2200  Weight: 74.4 kg    Examination:  Physical Exam Vitals and nursing note reviewed.  Constitutional:      General: He is not in acute distress.    Appearance: He is not toxic-appearing.  HENT:     Head: Normocephalic and atraumatic.     Nose: Nose normal.  Eyes:     General: No scleral  icterus. Cardiovascular:     Rate and Rhythm: Normal rate and regular rhythm.  Pulmonary:     Effort: Pulmonary effort is normal. No respiratory distress.     Comments: Coarse diminished BS bilaterally Abdominal:     General: Abdomen is flat. Bowel sounds are normal.     Comments: +g-tube  Musculoskeletal:     Right lower leg: No edema.     Left lower leg: No edema.  Skin:    General: Skin is warm and dry.     Capillary Refill: Capillary refill takes less than 2 seconds.  Neurological:     Mental Status: He is alert and oriented to person, place, and time.     Comments: Dysarthria(chronic) per patient. Pt also deaf in Right ear.     Data Reviewed: I have personally reviewed following labs and imaging studies  CBC: Recent Labs  Lab  11/20/24 1635 11/21/24 0527  WBC 14.6* 11.6*  NEUTROABS 13.4*  --   HGB 12.1* 9.7*  HCT 35.7* 29.8*  MCV 87.5 89.8  PLT 184 154   Basic Metabolic Panel: Recent Labs  Lab 11/20/24 1635 11/21/24 0527  NA 131* 134*  K 5.3* 5.3*  CL 94* 98  CO2 25 30  GLUCOSE 118* 92  BUN 39* 30*  CREATININE 1.20 1.17  CALCIUM 9.4 9.2  MG  --  2.0  PHOS  --  2.6   GFR: Estimated Creatinine Clearance: 59.2 mL/min (by C-G formula based on SCr of 1.17 mg/dL). Liver Function Tests: Recent Labs  Lab 11/20/24 1635 11/21/24 0527  AST 23 19  ALT 12 10  ALKPHOS 122 93  BILITOT 0.5 0.5  PROT 7.9 6.6  ALBUMIN 4.1 3.4*   Coagulation Profile: Recent Labs  Lab 11/20/24 1635  INR 1.0   Sepsis Labs: Recent Labs  Lab 11/20/24 1645 11/20/24 1905  LATICACIDVEN 1.4 1.5    Recent Results (from the past 240 hours)  Culture, blood (x 2)     Status: None (Preliminary result)   Collection Time: 11/20/24  4:30 PM   Specimen: BLOOD RIGHT FOREARM  Result Value Ref Range Status   Specimen Description   Final    BLOOD RIGHT FOREARM Performed at [facility], [address], [place], [place] [address]    Special Requests   Final    BOTTLES DRAWN  AEROBIC AND ANAEROBIC Blood Culture results may not be optimal due to an inadequate volume of blood received in culture bottles Performed at [facility], [address] [place], [place] [address]    Culture   Final    NO GROWTH < 24 HOURS Performed at [facility], [address] [place], [place] [address]    Report Status PENDING  Incomplete  Culture, blood (x 2)     Status: None (Preliminary result)   Collection Time: 11/20/24  4:35 PM   Specimen: BLOOD RIGHT HAND  Result Value Ref Range Status   Specimen Description   Final    BLOOD RIGHT HAND Performed at [facility], [address] [place], [place] [address]    Special Requests   Final    BOTTLES DRAWN AEROBIC AND ANAEROBIC Blood Culture results may not be optimal due to an inadequate volume of blood received in culture bottles Performed at [facility], [address] [place], [place] [address]    Culture   Final    NO GROWTH < 24 HOURS Performed at [facility], [address], [place], [place] [address]    Report Status PENDING  Incomplete  Resp panel by RT-PCR (RSV, Flu A&B, Covid) Anterior Nasal Swab     Status: None   Collection Time: 11/20/24  4:39 PM   Specimen: Anterior Nasal Swab  Result Value Ref Range Status   SARS Coronavirus 2 by RT PCR NEGATIVE NEGATIVE Final    Comment: (NOTE) SARS-CoV-2 target nucleic acids are NOT DETECTED.  The SARS-CoV-2 RNA is generally detectable in upper respiratory specimens during the acute phase of infection. The lowest concentration of SARS-CoV-2 viral copies this assay can detect is 138 copies/mL. A negative result does not preclude SARS-Cov-2 infection and should not be used as the sole basis for treatment or other patient management decisions. A negative result may occur with  improper specimen collection/handling, submission of specimen other than nasopharyngeal swab, presence of viral mutation(s)  within  the areas targeted by this assay, and inadequate number of viral copies(<138 copies/mL). A negative result must be combined with clinical observations, patient history, and epidemiological information. The expected result is Negative.  Fact Sheet for Patients:  bloggercourse.com  Fact Sheet for Healthcare Providers:  seriousbroker.it  This test is no t yet approved or cleared by the [place]  FDA and  has been authorized for detection and/or diagnosis of SARS-CoV-2 by FDA under an Emergency Use Authorization (EUA). This EUA will remain  in effect (meaning this test can be used) for the duration of the COVID-19 declaration under Section 564(b)(1) of the Act, 21 U.S.C.section 360bbb-3(b)(1), unless the authorization is terminated  or revoked sooner.       Influenza A by PCR NEGATIVE NEGATIVE Final   Influenza B by PCR NEGATIVE NEGATIVE Final    Comment: (NOTE) The Xpert Xpress SARS-CoV-2/FLU/RSV plus assay is intended as an aid in the diagnosis of influenza from Nasopharyngeal swab specimens and should not be used as a sole basis for treatment. Nasal washings and aspirates are unacceptable for Xpert Xpress SARS-CoV-2/FLU/RSV testing.  Fact Sheet for Patients: bloggercourse.com  Fact Sheet for Healthcare Providers: seriousbroker.it  This test is not yet approved or cleared by the [place]  FDA and has been authorized for detection and/or diagnosis of SARS-CoV-2 by FDA under an Emergency Use Authorization (EUA). This EUA will remain in effect (meaning this test can be used) for the duration of the COVID-19 declaration under Section 564(b)(1) of the Act, 21 U.S.C. section 360bbb-3(b)(1), unless the authorization is terminated or revoked.     Resp Syncytial Virus by PCR NEGATIVE NEGATIVE Final    Comment: (NOTE) Fact Sheet for  Patients: bloggercourse.com  Fact Sheet for Healthcare Providers: seriousbroker.it  This test is not yet approved or cleared by the [place]  FDA and has been authorized for detection and/or diagnosis of SARS-CoV-2 by FDA under an Emergency Use Authorization (EUA). This EUA will remain in effect (meaning this test can be used) for the duration of the COVID-19 declaration under Section 564(b)(1) of the Act, 21 U.S.C. section 360bbb-3(b)(1), unless the authorization is terminated or revoked.  Performed at [facility], [address], [place], [place] [address]   Expectorated Sputum Assessment w Gram Stain, Rflx to Resp Cult     Status: None   Collection Time: 11/21/24 10:37 AM   Specimen: Sputum  Result Value Ref Range Status   Specimen Description SPUTUM  Final   Special Requests NONE  Final   Sputum evaluation   Final    Sputum specimen not acceptable for testing.  Please recollect.   NOTIFIED DOVIE DRAFTS RN AT 1113 ON 1.4.25. FA Performed at [facility], [address], [place], [place] [address]    Report Status 11/21/2024 FINAL  Final     Radiology Studies: DG Chest Port 1 View Result Date: 11/20/2024 CLINICAL DATA:  Weakness, chills, productive cough EXAM: PORTABLE CHEST 1 VIEW COMPARISON:  Apr 09, 2024. FINDINGS: Stable cardiomediastinal silhouette. Left lung is clear. Stable elevated right hemidiaphragm. Mild right basilar opacity is noted concerning for atelectasis or infiltrate. Bony thorax is unremarkable. IMPRESSION: Mild right basilar opacity is noted concerning for atelectasis or infiltrate. Electronically Signed   By: Lynwood Landy Raddle M.D.   On: 11/20/2024 16:49    Scheduled Meds:  enoxaparin  (LOVENOX ) injection  40 mg Subcutaneous Q24H   metroNIDAZOLE   500 mg Per Tube Q12H   Continuous Infusions:  cefTRIAXone  (ROCEPHIN )  IV     feeding supplement (OSMOLITE 1.5 CAL) 60 mL/hr  at 11/21/24 0657     LOS: 1 day   Time spent: 65 minutes  Camellia Door, DO  [facility]  11/21/2024, 12:15 PM   Hospital at Home Admission Criteria Checklist:  Formal consent explained in detail and signed at the bedside: yes Patient meets inpatient admission criteria (see below for further details) yes Is pt Medicare FFS/[employer] Medicare-Medicaid, [employer], [employer] ( required for initial launch with plan to expand)? yes Lives within 25 mil/ 30 min from [facility] within [place](pt may stay with family member during admission who lives within 25 miles or 30 min from [facility]in [facility])? yes Hemodynamically stable with relatively low risk of clinical deterioration-not requiring ICU? yes Age >55? yes Does not require frequent touch-points or complex interventions/medications (ie Titrated Infusions (IV insulin, heparin drips, vasoactive drips, use of infused or injectable controlled substances, patients on insulin)? yes Any Behavioral Health comorbidities likely to increase risk for in-home care (ie Acute delirium or experiencing a marked altered mental status and cause is not a treatable condition in the home)? no Has the patient been on BIPAP during course of ED evaluation or hospitalization? no IF YES, Has the patient been off of BIPAP for >24 hours(If NO-THEN PATIENT DOES MEET INCLUSION CRITERIA)? not applicable On Room Air or Needs oxygen  at home (<6L)? is on oxygen  at 2 L/min per nasal cannula. Prn. Pt has home O2 concentrator Active safety concerns (ie Unable to use bedside commode independently and lacks caregiver support for safety- needs SNF placement, unable to obtain IV access)? no Has skin check been performed? yes  Has Physical Therapy screened the patient? no  Common admission diagnoses including: CAP, COPD Exacerbation, Acute on chronic heart failure, Cellulitis, UTI , dehydration, acute resp failure with hypoxia (requiring <5L)   Social Screening:  - Has  the family been directly contacted about Hospital at Home program with consent obtained (if yes- please document who was spoken to with name and phone number)? yes Lee Barnes. [phone]  -Was the family approached about the use of TOC pharmacy for medications at discharge? yes Denies significant ETOH intake? yes Does not smoke and understands may not smoke in the presence of oxygen ? yes Patient states able to use iPad/phone for communication/has family who is able to use? yes Patient has agreed to be compliant with medication and treatment regimen of the program? yes Any active drug use in patient or primary caregiver including daily dosing of methadone? no Stable home environment ( access to appropriate heating in cold conditions and/or appropriate air conditioning in hot conditions and/or no running water /electricity)? yes  No aggressive pets at home? no Firearm present? no  With ability or willingness to store them unloaded in a locked case for duration of hospitalization? not applicable Ambulatory? yes  no difficulty Bed bugs present on home evaluation? no Family support system in place? yes Patient feels safe at home and does not endorse any violence? yes Any actively decompensated behavioral health issues including agitation/aggressive behavior? no  Patient requests food to be provided by hospital home program? no PT/OT eval completed and not requiring SNF, ALF, inpatient rehab? no. PT/OT evaluation pending  To be admitted to the Hospital at [facility] program, a patient generally must meet the following: 1. Requirement for Inpatient Level of Care: The patient's condition must necessitate an inpatient level of care. This is typically indicated by one or more of the following, depending on  their specific diagnosis:  Persistent tachycardia despite appropriate treatment (e.g., for Heart Failure, UTI). Persistent tachypnea (rapid breathing) or dyspnea (shortness of breath) that hasn't improved  sufficiently with observation care (e.g., for Heart Failure, Pneumonia, Viral Illness, COVID). Hypoxemia (low oxygen  levels), such as a new need for oxygen , an increased need from baseline, or specific oxygen  saturation levels (e.g., SpO2 <90-94% depending on the condition) that persist despite observation (e.g., for Heart Failure, COPD, Pneumonia, Viral Illness, COVID). Need for Intravenous (IV) hydration due to an inability to maintain oral hydration, which persists despite observation care (e.g., for Cellulitis, UTI, Viral Illness, COVID). Specific to Heart Failure: Persistent pulmonary edema, indicated by a new oxygen  need, lack of improvement with IV diuretics, and ongoing tachypnea/dyspnea. Specific to COPD: A decrease in known baseline resting oxygen  saturation (SpO2) by 4% or more, or an increase in pre-existing supplemental oxygen  requirements, which persists despite observation and requires continued close monitoring. Specific to Pneumonia: A Pneumonia Severity Index (PSI) class IV (moderate risk). Specific to Cellulitis: Failure of outpatient antibiotic therapy (indicated by progression or no improvement after a minimum of 48 hours on an adequate regimen) or a clinical presentation (like acuity or rapidity of progression) that requires the intensity of monitoring found in an inpatient setting. Specific to UTI: Persistence or worsening of clinical findings like fever, pain, or dehydration despite observation care; presence of significant uropathy; suspected infection of an indwelling prosthetic device, stent, implant, or graft; or pregnancy with suspected pyelonephritis.  2. Appropriateness for Hospital at Home Setting: The patient's overall clinical picture, including the severity of their illness, their care needs, and their medical history and comorbidities, must be suitable for management in the Hospital at Home environment. This essentially means that none of the exclusion criteria (listed  below) are met.  Unified Exclusion Criteria for Hospital at Home Admission: A patient would not be eligible for Hospital at Home if any of the following are present: Hemodynamic Instability: Hypotension (low blood pressure) is present. Respiratory Instability or Needs Beyond Program Capability: There is a new need for invasive or noninvasive ventilatory assistance (like BiPAP or a ventilator). Oxygenation is not sufficient, generally indicated if an FiO2 (fraction of inspired oxygen ) of 45% (which is about 6 Liters/minute via nasal cannula) or more is required to keep oxygen  saturation (SpO2) at 90% or greater. Monitoring or Procedural Needs Beyond Program Capability: There is a need for invasive monitoring, such as a pulmonary artery catheter or an arterial line. There is a need for immediate-response telemetry monitoring (for dangerous arrhythmia detection and subsequent immediate intervention). The required medication regimen is beyond the capabilities of Hospital at Home (e.g., dosing intervals are too frequent for home administration). There is a need for a procedure that cannot be performed by the Hospital at [facility] team (e.g., significant wound debridement or abscess drainage for cellulitis, or percutaneous nephrostomy for a complicated UTI). Significant Organ Dysfunction or Markers of Severe Illness: Mental status is not at baseline, or there is altered mental status suggestive of inadequate perfusion. Renal (kidney) function is unstable or showing an ongoing decline. There is evidence of inadequate perfusion, such as metabolic acidosis or myocardial ischemia. Uncompensated acidosis is present. Condition-Specific Severity or Complications Making Home Care Unsuitable: For Heart Failure: Known severe cardiac valvular disease (e.g., aortic stenosis, mitral regurgitation); or severe peripheral edema that impairs the ability to urinate or ambulate. For COPD: Known concurrent comorbidity or  finding that indicates a higher-risk COPD exacerbation (e.g., pulmonary fibrosis, cavitation, pleural effusion, pneumothorax, rib  fracture). For Pneumonia: Pneumonia Severity Index (PSI) class V (indicating high risk for inpatient mortality); known concurrent comorbidity or finding that indicates higher-risk pneumonia (e.g., pulmonary fibrosis, cavitation, large or loculated pleural effusion); or a concomitant serious infectious process like endocarditis or empyema. For Cellulitis: Orbital, periorbital, or necrotizing infection is suspected; or a concomitant serious infectious process like endocarditis, septic emboli, or septic joint space infection. For UTI: Urinary tract obstruction (e.g., kidney stone, bladder outlet obstruction); or a concomitant serious infectious process like endocarditis or septic emboli. For Viral Illness & COVID-19: A concomitant serious infectious process like endocarditis or empyema.  General Comorbidities or Status:  The patient is significantly immunosuppressed (this applies to Pneumonia, Cellulitis, UTI, Viral Illness, and COVID-19). The patient meets inpatient admission criteria for a second diagnosis, or has care needs beyond the capabilities of Hospital at Home due to an active clinically significant comorbidity. (This is a general exclusion across all listed conditions)  "

## 2024-11-21 NOTE — Plan of Care (Signed)
   Problem: Fluid Volume: Goal: Hemodynamic stability will improve Outcome: Progressing   Problem: Clinical Measurements: Goal: Diagnostic test results will improve Outcome: Progressing Goal: Signs and symptoms of infection will decrease Outcome: Progressing

## 2024-11-21 NOTE — Assessment & Plan Note (Addendum)
 11/21/2024 chronic. Na 134 today.

## 2024-11-21 NOTE — Assessment & Plan Note (Signed)
 11/21/2024 continue prn midodrine .

## 2024-11-21 NOTE — Assessment & Plan Note (Addendum)
 11/21/2024 normally on RA. Has O2 if needed. Continue with supplemental O2. Pt has home O2 concentrator.

## 2024-11-21 NOTE — Assessment & Plan Note (Addendum)
 11/21/2024 chronic. 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 Pt has tube feed pump at home. Wife states she does not need hospital supplies. She has all the tube feedings she needs for him at home.

## 2024-11-21 NOTE — Subjective & Objective (Signed)
 Pt seen and examined. Met with pt and [name]. Introduced Hospital at [facility] program. Discussed benefits and participation requirements.  Opportunity for pt and wife to asked questions. Both pt and wife want to enroll. Wife is at home full time with pt. He is strictly NPO. He has G-tube and is strictly NPO. Pt has all equipment at home including tube feed pumps, suction machine, nebulizer machine, home O2 concentrator.  PT/OT consult is pending.

## 2024-11-21 NOTE — Assessment & Plan Note (Addendum)
 11/21/2024 diagnosed and treated in 2011. 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

## 2024-11-21 NOTE — Assessment & Plan Note (Addendum)
 11/21/2024 K 5.3 today. Will stop lovenox . Continue with free H2O via g-tube. Repeat BMP in AM.

## 2024-11-21 NOTE — Assessment & Plan Note (Addendum)
 11/21/2024 check TIBC, Fe, %sat. No bleeding noted. Admit HgB 12.1 g/dl. HgB of 9.7. anemia may be dilutional from IVF.

## 2024-11-21 NOTE — Hospital Course (Signed)
 CC: cough, fever HPI: Lee Barnes is a 74 y.o. 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  Apparently, patient has been having productive cough with some phlegm, he has home oxygen  which he uses as needed.  Patient endorsed having reflux yesterday.  Per wife at bedside, patient was noted with vomiting today and appears to have aspirated some stomach contents while vomiting as he had similar presentation before that resulted in recurrent aspiration pneumonia.   ED course In the emergency department, he was febrile, tachycardic and have leukocytosis.  Workup in the ED showed normocytic anemia and leukocytosis.  BMP showed sodium 131, potassium 5.3, chloride 94, bicarb 25, glucose 118, potassium 5.3.  Respiratory panel was negative. Chest x-ray showed mild right basilar opacity concerning for atelectasis or infiltrates He was treated with ceftriaxone  and azithromycin .  Unasyn  was added on realizing that patient may have aspirated.  IV hydration per sepsis protocol was provided.SABRA  TRH was asked to admit patient  Significant Events: Admitted 11/20/2024 for aspiration pneumonia. 11-22-2023 consented for [facility] pending PT/OT assessment  Admission Labs: WBC 14.6, HgB 12.1, plt 184 Lactic acid 1.4 Na 131, K 5.3, CO2 of 25, BUN 39, Scr 1.20, glu 118, Ca 9.4 T. Prot 7.9, alb 4.1, AST 23, ALT 12, alk phos 122, T. Bili 0.5 Covid/flu/RSV negative  Admission Imaging Studies: CXR Mild right basilar opacity is noted concerning for atelectasis or infiltrate.  Significant Labs: Strep Pneumo antigen Negative  Significant Imaging Studies:   Antibiotic Therapy: Anti-infectives (From admission, onward)    Start     Dose/Rate Route Frequency Ordered  Stop   11/21/24 0200  Ampicillin -Sulbactam (UNASYN ) 3 g in sodium chloride  0.9 % 100 mL IVPB        3 g 200 mL/hr over 30 Minutes Intravenous Every 6 hours 11/20/24 2357     11/20/24 1945  Ampicillin -Sulbactam (UNASYN ) 3 g in sodium chloride  0.9 % 100 mL IVPB        3 g 200 mL/hr over 30 Minutes Intravenous  Once 11/20/24 1939 11/20/24 2038   11/20/24 1615  cefTRIAXone  (ROCEPHIN ) 2 g in sodium chloride  0.9 % 100 mL IVPB  Status:  Discontinued        2 g 200 mL/hr over 30 Minutes Intravenous Every 24 hours 11/20/24 1607 11/21/24 0812   11/20/24 1615  azithromycin  (ZITHROMAX ) 500 mg in sodium chloride  0.9 % 250 mL IVPB        500 mg 250 mL/hr over 60 Minutes Intravenous Every 24 hours 11/20/24 1607 11/25/24 1614       Procedures:   Consultants:

## 2024-11-21 NOTE — Assessment & Plan Note (Addendum)
 11/21/2024 initially placed on Unasyn  and zithromax . Given his hx of aspiration, will consolidate treatment to IV Rocephin  and liquid flagyl (for g-tube administration). Continue with prn nebs

## 2024-11-22 ENCOUNTER — Other Ambulatory Visit (HOSPITAL_COMMUNITY): Payer: Self-pay

## 2024-11-22 DIAGNOSIS — Z85819 Personal history of malignant neoplasm of unspecified site of lip, oral cavity, and pharynx: Secondary | ICD-10-CM

## 2024-11-22 DIAGNOSIS — A419 Sepsis, unspecified organism: Secondary | ICD-10-CM | POA: Diagnosis not present

## 2024-11-22 DIAGNOSIS — E871 Hypo-osmolality and hyponatremia: Secondary | ICD-10-CM | POA: Diagnosis not present

## 2024-11-22 DIAGNOSIS — G903 Multi-system degeneration of the autonomic nervous system: Secondary | ICD-10-CM | POA: Diagnosis not present

## 2024-11-22 DIAGNOSIS — J690 Pneumonitis due to inhalation of food and vomit: Secondary | ICD-10-CM | POA: Diagnosis not present

## 2024-11-22 DIAGNOSIS — J189 Pneumonia, unspecified organism: Secondary | ICD-10-CM | POA: Diagnosis not present

## 2024-11-22 DIAGNOSIS — D649 Anemia, unspecified: Secondary | ICD-10-CM | POA: Diagnosis not present

## 2024-11-22 DIAGNOSIS — J9601 Acute respiratory failure with hypoxia: Secondary | ICD-10-CM | POA: Diagnosis not present

## 2024-11-22 DIAGNOSIS — Z789 Other specified health status: Secondary | ICD-10-CM | POA: Diagnosis not present

## 2024-11-22 DIAGNOSIS — E875 Hyperkalemia: Secondary | ICD-10-CM | POA: Diagnosis not present

## 2024-11-22 LAB — CBC
HCT: 32.7 % — ABNORMAL LOW (ref 39.0–52.0)
Hemoglobin: 10.9 g/dL — ABNORMAL LOW (ref 13.0–17.0)
MCH: 29.5 pg (ref 26.0–34.0)
MCHC: 33.3 g/dL (ref 30.0–36.0)
MCV: 88.6 fL (ref 80.0–100.0)
Platelets: 168 K/uL (ref 150–400)
RBC: 3.69 MIL/uL — ABNORMAL LOW (ref 4.22–5.81)
RDW: 14.1 % (ref 11.5–15.5)
WBC: 8.9 K/uL (ref 4.0–10.5)
nRBC: 0 % (ref 0.0–0.2)

## 2024-11-22 LAB — BASIC METABOLIC PANEL WITH GFR
Anion gap: 12 (ref 5–15)
BUN: 18 mg/dL (ref 8–23)
CO2: 23 mmol/L (ref 22–32)
Calcium: 9.6 mg/dL (ref 8.9–10.3)
Chloride: 94 mmol/L — ABNORMAL LOW (ref 98–111)
Creatinine, Ser: 0.94 mg/dL (ref 0.61–1.24)
GFR, Estimated: >60 mL/min
Glucose, Bld: 92 mg/dL (ref 70–99)
Potassium: 4.4 mmol/L (ref 3.5–5.1)
Sodium: 130 mmol/L — ABNORMAL LOW (ref 135–145)

## 2024-11-22 LAB — VITAMIN B12: Vitamin B-12: 606 pg/mL (ref 180–914)

## 2024-11-22 LAB — FERRITIN: Ferritin: 237 ng/mL (ref 24–336)

## 2024-11-22 LAB — FOLATE: Folate: 15 ng/mL

## 2024-11-22 MED ORDER — DEXTROMETHORPHAN-GUAIFENESIN 10-100 MG/5ML PO LIQD
5.0000 mL | Freq: Four times a day (QID) | ORAL | 0 refills | Status: AC | PRN
  Filled 2024-11-22: qty 118, 6d supply, fill #0

## 2024-11-22 MED ORDER — IRON SUCROSE 200 MG IVPB - SIMPLE MED
200.0000 mg | Freq: Once | Status: AC
  Administered 2024-11-22: 200 mg via INTRAVENOUS
  Filled 2024-11-22: qty 200

## 2024-11-22 MED ORDER — FERROUS SULFATE 325 (65 FE) MG PO TBEC
325.0000 mg | DELAYED_RELEASE_TABLET | Freq: Every day | ORAL | 1 refills | Status: AC
  Filled 2024-11-22: qty 30, 30d supply, fill #0

## 2024-11-22 MED ORDER — OSMOLITE 1.5 CAL PO LIQD
1000.0000 mL | ORAL | Status: DC
  Filled 2024-11-22: qty 1000

## 2024-11-22 MED ORDER — AMOXICILLIN-POT CLAVULANATE 400-57 MG/5ML PO SUSR
400.0000 mg | Freq: Two times a day (BID) | ORAL | 0 refills | Status: DC
  Filled 2024-11-22: qty 100, 10d supply, fill #0

## 2024-11-22 NOTE — TOC CM/SW Note (Signed)
 Transition of Care [facility]) - Inpatient Brief Assessment  Patient Details  Name: Lee Barnes MRN: [id number] Date of Birth: 11/25/1950  Transition of Care [facility]) CM/SW Contact:    Duwaine GORMAN Aran, LCSW Phone Number: 11/22/2024, 10:17 AM  Clinical Narrative: PT evaluation did not recommend any follow up or DME, so there are no care management needs at this time.  Transition of Care Asessment: Insurance and Status: Insurance coverage has been reviewed Patient has primary care physician: Yes Home environment has been reviewed: Resides in single family home with spouse Prior level of function:: Independent with ADLs at baseline Prior/Current Home Services: No current home services Social Drivers of Health Review: SDOH reviewed no interventions necessary Readmission risk has been reviewed: Yes Transition of care needs: no transition of care needs at this time

## 2024-11-22 NOTE — Discharge Summary (Signed)
 " Physician Discharge Summary   Patient: Lee Barnes MRN: [id number] DOB: [birth year]  Admit date:     11/20/2024  Discharge date: 11/22/2024  Discharge Physician: Concepcion Riser   PCP: Joyce Norleen BROCKS, MD   Recommendations at discharge:    PCP follow up in 1 week.  Discharge Diagnoses: Principal Problem:   Sepsis due to pneumonia [facility]) Active Problems:   Aspiration pneumonia (HCC)   Acute respiratory failure with hypoxia (HCC)   Hyperkalemia   History of oropharyngeal cancer   Chronic hyponatremia   On tube feeding diet   Normocytic anemia   Neurogenic orthostatic hypotension (HCC)  Resolved Problems:   * No resolved hospital problems. *  Hospital Course: CC: cough, fever HPI: Lee Barnes is a 74 y.o. 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  Apparently, patient has been having productive cough with some phlegm, he has home oxygen  which he uses as needed.  Patient endorsed having reflux yesterday.  Per wife at bedside, patient was noted with vomiting today and appears to have aspirated some stomach contents while vomiting as he had similar presentation before that resulted in recurrent aspiration pneumonia.   ED course In the emergency department, he was febrile, tachycardic and have leukocytosis.  Workup in the ED showed normocytic anemia and leukocytosis.  BMP showed sodium 131, potassium 5.3, chloride 94, bicarb 25, glucose 118, potassium 5.3.  Respiratory panel was negative. Chest x-ray showed mild right basilar opacity concerning for atelectasis or infiltrates He was treated with ceftriaxone  and azithromycin .  Unasyn  was added on realizing that patient may have aspirated.  IV hydration per sepsis protocol was  provided.SABRA  TRH was asked to admit patient  Assessment and Plan: * Sepsis due to pneumonia (HCC) Aspiration pneumonia [facility]) Patient presented with fever, tachycardia, white count 14.6. Patient was started on Rocephin  and azithromycin  therapy. Patient's symptoms improved, white count normal today, afebrile, breathing better, cough improved.  Wishes to go home. Antibiotics transition to Augmentin  therapy for 5 more days. Advised to follow-up with PCP upon discharge as instructed.  Patient and family understand agree with the discharge plan.  Hyperkalemia Patient's potassium 5.3 did improve with 1 dose of Lokelma . Advised follow-up with PCP for electrolyte management.  Chronic hyponatremia: Stable, continue free water  with tube feeds.  Acute respiratory failure with hypoxia (HCC) Patient's oxygen  tapered off.  His symptoms improved. Patient does have oxygen  as needed at home per family.  Normocytic anemia No bleeding noted.  Hemoglobin stable.  Iron  deficiency noted. 1 dose of IV iron  therapy given.  Advised oral iron  supplements.  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 Status post PEG tube.  Continue PEG feeds.  Neurogenic orthostatic hypotension (HCC) Continue midodrine .      Consultants: none Procedures performed: none  Disposition: Home Diet recommendation:  NPO tube feeds DISCHARGE MEDICATION: Allergies as of 11/22/2024       Reactions   Codeine Nausea And Vomiting        Medication List     STOP taking these medications    amoxicillin -clavulanate 875-125 MG tablet Commonly known as: AUGMENTIN  Replaced by: amoxicillin -clavulanate 400-57 MG/5ML suspension   guaiFENesin  100 MG/5ML liquid Commonly known as: ROBITUSSIN  TAKE these medications    acetaminophen  325 MG tablet Commonly known as: TYLENOL  Place 2 tablets (650 mg total) into feeding tube every 6 (six) hours as  needed for mild pain (or Fever >/= 101).   albuterol  (2.5 MG/3ML) 0.083% nebulizer solution Commonly known as: PROVENTIL  Take 3 mLs (2.5 mg total) by nebulization every 4 (four) hours as needed for wheezing or shortness of breath. What changed:  when to take this additional instructions   amoxicillin -clavulanate 400-57 MG/5ML suspension Commonly known as: AUGMENTIN  Take 5 mLs (400 mg total) by mouth 2 (two) times daily for 5 days. Replaces: amoxicillin -clavulanate 875-125 MG tablet   azelastine  0.1 % nasal spray Commonly known as: ASTELIN  Place 1 spray into both nostrils 2 (two) times daily. Use in each nostril as directed   cetirizine 10 MG chewable tablet Commonly known as: ZYRTEC Place 10 mg into feeding tube daily.   diphenhydrAMINE  25 MG tablet Commonly known as: BENADRYL  Take 12.5 mg by mouth at bedtime.   ProSource Liqd Take 30 mLs by mouth daily with lunch. What changed: Another medication with the same name was changed. Make sure you understand how and when to take each.   feeding supplement (OSMOLITE 1.5 CAL) Liqd Place 1,425 mLs into feeding tube daily. What changed: when to take this   ferrous sulfate  325 (65 FE) MG EC tablet Take 1 tablet (325 mg total) by mouth daily with breakfast.   fluticasone  50 MCG/ACT nasal spray Commonly known as: FLONASE  SPRAY 2 SPRAYS IN EACH NOSTRIL ONCE DAILY What changed:  how much to take how to take this when to take this   guaiFENesin -dextromethorphan  100-10 MG/5ML syrup Commonly known as: ROBITUSSIN DM Place 5 mLs into feeding tube every 6 (six) hours as needed for cough.   hydrALAZINE  25 MG tablet Commonly known as: APRESOLINE  Place 1 tablet (25 mg total) into feeding tube every 8 (eight) hours as needed (for SBP greater than 175).   midodrine  5 MG tablet Commonly known as: PROAMATINE  Take 5 mg by mouth 3 (three) times daily with meals as needed (low BP).   ondansetron  4 MG/5ML solution Commonly known as:  ZOFRAN  Place 4 mg into feeding tube every 8 (eight) hours as needed for nausea or vomiting.   pantoprazole  sodium 40 mg Commonly known as: PROTONIX  Place 40 mg into feeding tube daily.   Saccharomyces boulardii 250 MG Pack Take 0.5 each by mouth as needed.        Discharge Exam: Filed Weights   11/20/24 2200  Weight: 74.4 kg      11/22/2024    1:14 PM 11/22/2024    5:58 AM 11/22/2024   12:31 AM  Vitals with BMI  Systolic 160 148 833  Diastolic 83 82 104  Pulse 84 93 88    General - Elderly Caucasian thin built male, no apparent distress HEENT - PERRLA, EOMI, atraumatic head, non tender sinuses. Lung - Clear, right basal rhonchi, no wheezes. Heart - S1, S2 heard, no murmurs, rubs, no pedal edema. Abdomen - Soft, non tender, bowel sounds good, PEG tube intact Neuro - Alert, awake and oriented x 3, non focal exam. Skin - Warm and dry.  Condition at discharge: stable  The results of significant diagnostics from this hospitalization (including imaging, microbiology, ancillary and laboratory) are listed below for reference.   Imaging Studies: DG Chest Port 1 View Result Date: 11/20/2024 CLINICAL DATA:  Weakness, chills, productive cough EXAM: PORTABLE CHEST 1 VIEW COMPARISON:  Apr 09, 2024. FINDINGS: Stable  cardiomediastinal silhouette. Left lung is clear. Stable elevated right hemidiaphragm. Mild right basilar opacity is noted concerning for atelectasis or infiltrate. Bony thorax is unremarkable. IMPRESSION: Mild right basilar opacity is noted concerning for atelectasis or infiltrate. Electronically Signed   By: Lynwood Landy Raddle M.D.   On: 11/20/2024 16:49    Microbiology: Results for orders placed or performed during the hospital encounter of 11/20/24  Culture, blood (x 2)     Status: None (Preliminary result)   Collection Time: 11/20/24  4:30 PM   Specimen: BLOOD RIGHT FOREARM  Result Value Ref Range Status   Specimen Description   Final    BLOOD RIGHT FOREARM Performed at  [facility], [address], [place], [place] [address]    Special Requests   Final    BOTTLES DRAWN AEROBIC AND ANAEROBIC Blood Culture results may not be optimal due to an inadequate volume of blood received in culture bottles Performed at [facility], [address] [place], [place] [address]    Culture   Final    NO GROWTH 2 DAYS Performed at [facility], [address] [place], [place] [address]    Report Status PENDING  Incomplete  Culture, blood (x 2)     Status: None (Preliminary result)   Collection Time: 11/20/24  4:35 PM   Specimen: BLOOD RIGHT HAND  Result Value Ref Range Status   Specimen Description   Final    BLOOD RIGHT HAND Performed at [facility], [address] [place], [place] [address]    Special Requests   Final    BOTTLES DRAWN AEROBIC AND ANAEROBIC Blood Culture results may not be optimal due to an inadequate volume of blood received in culture bottles Performed at [facility], [address], [place], [place] [address]    Culture   Final    NO GROWTH 2 DAYS Performed at [facility], [address], [place], [place] [address]    Report Status PENDING  Incomplete  Resp panel by RT-PCR (RSV, Flu A&B, Covid) Anterior Nasal Swab     Status: None   Collection Time: 11/20/24  4:39 PM   Specimen: Anterior Nasal Swab  Result Value Ref Range Status   SARS Coronavirus 2 by RT PCR NEGATIVE NEGATIVE Final    Comment: (NOTE) SARS-CoV-2 target nucleic acids are NOT DETECTED.  The SARS-CoV-2 RNA is generally detectable in upper respiratory specimens during the acute phase of infection. The lowest concentration of SARS-CoV-2 viral copies this assay can detect is 138 copies/mL. A negative result does not preclude SARS-Cov-2 infection and should not be used as the sole basis for treatment or other patient management decisions. A negative result may occur with  improper specimen collection/handling,  submission of specimen other than nasopharyngeal swab, presence of viral mutation(s) within the areas targeted by this assay, and inadequate number of viral copies(<138 copies/mL). A negative result must be combined with clinical observations, patient history, and epidemiological information. The expected result is Negative.  Fact Sheet for Patients:  bloggercourse.com  Fact Sheet for Healthcare Providers:  seriousbroker.it  This test is no t yet approved or cleared by the [place]  FDA and  has been authorized for detection and/or diagnosis of SARS-CoV-2 by FDA under an Emergency Use Authorization (EUA). This EUA will remain  in effect (meaning this test can be used) for the duration of the COVID-19 declaration under Section 564(b)(1) of the Act, 21 U.S.C.section 360bbb-3(b)(1),  unless the authorization is terminated  or revoked sooner.       Influenza A by PCR NEGATIVE NEGATIVE Final   Influenza B by PCR NEGATIVE NEGATIVE Final    Comment: (NOTE) The Xpert Xpress SARS-CoV-2/FLU/RSV plus assay is intended as an aid in the diagnosis of influenza from Nasopharyngeal swab specimens and should not be used as a sole basis for treatment. Nasal washings and aspirates are unacceptable for Xpert Xpress SARS-CoV-2/FLU/RSV testing.  Fact Sheet for Patients: bloggercourse.com  Fact Sheet for Healthcare Providers: seriousbroker.it  This test is not yet approved or cleared by the [place]  FDA and has been authorized for detection and/or diagnosis of SARS-CoV-2 by FDA under an Emergency Use Authorization (EUA). This EUA will remain in effect (meaning this test can be used) for the duration of the COVID-19 declaration under Section 564(b)(1) of the Act, 21 U.S.C. section 360bbb-3(b)(1), unless the authorization is terminated or revoked.     Resp Syncytial Virus by PCR NEGATIVE  NEGATIVE Final    Comment: (NOTE) Fact Sheet for Patients: bloggercourse.com  Fact Sheet for Healthcare Providers: seriousbroker.it  This test is not yet approved or cleared by the [place]  FDA and has been authorized for detection and/or diagnosis of SARS-CoV-2 by FDA under an Emergency Use Authorization (EUA). This EUA will remain in effect (meaning this test can be used) for the duration of the COVID-19 declaration under Section 564(b)(1) of the Act, 21 U.S.C. section 360bbb-3(b)(1), unless the authorization is terminated or revoked.  Performed at [facility], [address], [place], [place] [address]   Expectorated Sputum Assessment w Gram Stain, Rflx to Resp Cult     Status: None   Collection Time: 11/21/24 10:37 AM   Specimen: Sputum  Result Value Ref Range Status   Specimen Description SPUTUM  Final   Special Requests NONE  Final   Sputum evaluation   Final    Sputum specimen not acceptable for testing.  Please recollect.   NOTIFIED DOVIE DRAFTS RN AT 1113 ON 1.4.25. FA Performed at [facility], [address] [place], [place] [address]    Report Status 11/21/2024 FINAL  Final  Expectorated Sputum Assessment w Gram Stain, Rflx to Resp Cult     Status: None   Collection Time: 11/21/24 12:31 PM   Specimen: SPU  Result Value Ref Range Status   Specimen Description SPUTUM  Final   Special Requests NONE  Final   Sputum evaluation   Final    Sputum specimen not acceptable for testing.  Please recollect.   NOTIFIED DOVIE DRAFTS RN AT 1323 ON 1.4.26. FA Performed at [facility], [address], [place], [place] [address]    Report Status 11/21/2024 FINAL  Final    Labs: CBC: Recent Labs  Lab 11/20/24 1635 11/21/24 0527 11/22/24 0842  WBC 14.6* 11.6* 8.9  NEUTROABS 13.4*  --   --   HGB 12.1* 9.7* 10.9*  HCT 35.7* 29.8* 32.7*  MCV 87.5 89.8 88.6  PLT 184  154 168   Basic Metabolic Panel: Recent Labs  Lab 11/20/24 1635 11/21/24 0527 11/22/24 0842  NA 131* 134* 130*  K 5.3* 5.3* 4.4  CL 94* 98 94*  CO2 25 30 23   GLUCOSE 118* 92 92  BUN 39* 30* 18  CREATININE 1.20 1.17 0.94  CALCIUM 9.4 9.2 9.6  MG  --  2.0  --   PHOS  --  2.6  --    Liver Function Tests:  Recent Labs  Lab 11/20/24 1635 11/21/24 0527  AST 23 19  ALT 12 10  ALKPHOS 122 93  BILITOT 0.5 0.5  PROT 7.9 6.6  ALBUMIN 4.1 3.4*   CBG: No results for input(s): GLUCAP in the last 168 hours.  Discharge time spent: 36 minutes.  Signed: Concepcion Riser, MD [facility] 11/22/2024 "

## 2024-11-22 NOTE — Plan of Care (Signed)
  Problem: Fluid Volume: Goal: Hemodynamic stability will improve Outcome: Adequate for Discharge   Problem: Clinical Measurements: Goal: Diagnostic test results will improve Outcome: Adequate for Discharge Goal: Signs and symptoms of infection will decrease Outcome: Adequate for Discharge   Problem: Respiratory: Goal: Ability to maintain adequate ventilation will improve Outcome: Adequate for Discharge   Problem: Education: Goal: Knowledge of General Education information will improve Description: Including pain rating scale, medication(s)/side effects and non-pharmacologic comfort measures Outcome: Adequate for Discharge

## 2024-11-22 NOTE — Progress Notes (Signed)
 Initial Nutrition Assessment  DOCUMENTATION CODES:   Severe malnutrition in context of chronic illness  INTERVENTION:   -Increase Osmolite 1.5 to 80 ml/hr via G-tube which is home regimen. -Provides 2880 kcals, 120g protein and 1463 ml H2O -Recommend free water : 100 ml every 4 hours (600 ml)  NUTRITION DIAGNOSIS:   Severe Malnutrition related to chronic illness as evidenced by severe fat depletion, severe muscle depletion.  GOAL:   Patient will meet greater than or equal to 90% of their needs  MONITOR:   TF tolerance  REASON FOR ASSESSMENT:   Consult Enteral/tube feeding initiation and management  ASSESSMENT:   74 y.o. 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 Admitted for aspiration pneumonia.  Patient in room, family at bedside. Pt tolerating Osmolite 1.5 @ 60 ml/hr. States at home he usually runs it at 80 ml/hr and sometimes 95 ml/hr when he will be off the pump. Weight has remained stable on home regimen so pt and family agree with increasing to 80 ml/hr today. NPO and tube feeding dependent for years given history of head and neck cancer.  UBW ~160-164 lbs. Current weight: 164 lbs.  Medications: Zofran   Labs reviewed: Low sodium Low iron    NUTRITION - FOCUSED PHYSICAL EXAM:  Flowsheet Row Most Recent Value  Orbital Region Moderate depletion  Upper Arm Region Severe depletion  Thoracic and Lumbar Region Severe depletion  Buccal Region Severe depletion  Temple Region Severe depletion  Clavicle Bone Region Severe depletion  Clavicle and Acromion Bone Region Severe depletion  Scapular Bone Region Severe depletion  Dorsal Hand Moderate depletion  Patellar Region Severe depletion  Anterior Thigh Region Severe  depletion  Posterior Calf Region Severe depletion  Edema (RD Assessment) None  Hair Reviewed  Eyes Reviewed  Mouth Reviewed  Skin Reviewed  Nails Reviewed    Diet Order:   Diet Order             Diet NPO time specified  Diet effective now                   EDUCATION NEEDS:   No education needs have been identified at this time  Skin:  Skin Assessment: Reviewed RN Assessment  Last BM:  1/4  Height:   Ht Readings from Last 1 Encounters:  11/20/24 6' (1.829 m)    Weight:   Wt Readings from Last 1 Encounters:  11/20/24 74.4 kg    BMI:  Body mass index is 22.25 kg/m.  Estimated Nutritional Needs:   Kcal:  2200-2400  Protein:  100-115g  Fluid:  2.2L/day   Morna Lee, MS, RD, LDN Inpatient Clinical Dietitian Contact via Secure chat

## 2024-11-22 NOTE — Plan of Care (Signed)
" °  Problem: Fluid Volume: Goal: Hemodynamic stability will improve Outcome: Progressing   Problem: Respiratory: Goal: Ability to maintain adequate ventilation will improve Outcome: Progressing   Problem: Clinical Measurements: Goal: Ability to maintain clinical measurements within normal limits will improve Outcome: Progressing   Problem: Pain Managment: Goal: General experience of comfort will improve and/or be controlled Outcome: Progressing   Problem: Activity: Goal: Ability to tolerate increased activity will improve Outcome: Progressing   "

## 2024-11-22 NOTE — Progress Notes (Signed)
 AVS teaching done with Lee Barnes and his wife, Lee Barnes- both verbalized an understanding- Iron  infusion infusing- patient will discharge home after it is complete.

## 2024-11-22 NOTE — Progress Notes (Signed)
 Discharge meds in a secure bag delivered to patient by this RN

## 2024-11-23 LAB — LEGIONELLA PNEUMOPHILA SEROGP 1 UR AG: L. pneumophila Serogp 1 Ur Ag: NEGATIVE

## 2024-11-25 ENCOUNTER — Other Ambulatory Visit (HOSPITAL_COMMUNITY): Payer: Self-pay

## 2024-11-25 LAB — CULTURE, BLOOD (ROUTINE X 2)
Culture: NO GROWTH
Culture: NO GROWTH

## 2024-11-30 ENCOUNTER — Encounter: Payer: Self-pay | Admitting: Family Medicine

## 2024-11-30 ENCOUNTER — Ambulatory Visit: Admitting: Family Medicine

## 2024-11-30 VITALS — BP 140/86 | HR 76 | Ht 72.0 in | Wt 163.2 lb

## 2024-11-30 DIAGNOSIS — E871 Hypo-osmolality and hyponatremia: Secondary | ICD-10-CM

## 2024-11-30 DIAGNOSIS — Z85819 Personal history of malignant neoplasm of unspecified site of lip, oral cavity, and pharynx: Secondary | ICD-10-CM

## 2024-11-30 DIAGNOSIS — J690 Pneumonitis due to inhalation of food and vomit: Secondary | ICD-10-CM

## 2024-11-30 DIAGNOSIS — Z931 Gastrostomy status: Secondary | ICD-10-CM

## 2024-11-30 NOTE — Progress Notes (Signed)
" ° °  Subjective:    Patient ID: Lee Barnes, male    DOB: 09/11/1951, 74 y.o.   MRN: [id number]  Discussed the use of AI scribe software for clinical note transcription with the patient, who gave verbal consent to proceed.  History of Present Illness   Lee Barnes is a 74 year old male who presents for follow-up after hospitalization for pneumonia.  He was recently hospitalized for pneumonia and treated with multiple antibiotics, including Augmentin , which he completed on Saturday. He experienced gastrointestinal issues, primarily diarrhea, as a side effect of Augmentin , but these symptoms have resolved since discontinuation. No fever, shortness of breath, or increased coughing since stopping the medication.  He has a history of low iron  and B vitamin deficiency. He is currently taking extra iron  and plans to add B vitamins to his regimen. His hemoglobin was noted to be 10.9, and his iron  level was 19, which is considered low. He is monitoring for constipation as a side effect of iron  supplementation.  He experiences a sensation of increased body temperature and phlegm in his throat upon waking, which he attributes to medication side effects.  He has a history of a neck injury from a fall, which has been ongoing for years. He is more attentive to his breathing following the pneumonia diagnosis.           Review of Systems The hospital chart including admission H&P, blood work and x-rays was reviewed.    Objective:    Physical Exam Alert and in no distress.  Color is good.   CHEST: Breathing pattern normal. Lungs clear to auscultation on the right.            Assessment & Plan:    Chronic hyponatremia  G tube feedings (HCC)  History of oropharyngeal cancer   Recent pneumonia Pneumonia resolved post-Augmentin . No respiratory symptoms. GI issues resolved post-antibiotic. - Continue dextromethorphan  as needed for coughing.  Iron  deficiency anemia Mild anemia  with hemoglobin at 10.9. Low iron  stores. Fatigue likely due to iron  deficiency. - Continue iron  supplementation. - Monitor for constipation. - Re-evaluate iron  levels and symptoms in three months.  Vitamin B deficiency Low B vitamins contributing to fatigue. Receiving multivitamins through formula. - Continue current multivitamin regimen. - Consider additional B vitamin supplementation if needed.        "

## 2024-12-02 ENCOUNTER — Other Ambulatory Visit: Payer: Self-pay

## 2024-12-02 DIAGNOSIS — Z85819 Personal history of malignant neoplasm of unspecified site of lip, oral cavity, and pharynx: Secondary | ICD-10-CM

## 2024-12-02 DIAGNOSIS — K219 Gastro-esophageal reflux disease without esophagitis: Secondary | ICD-10-CM

## 2024-12-02 DIAGNOSIS — Z931 Gastrostomy status: Secondary | ICD-10-CM

## 2024-12-02 DIAGNOSIS — Z934 Other artificial openings of gastrointestinal tract status: Secondary | ICD-10-CM

## 2024-12-02 DIAGNOSIS — J690 Pneumonitis due to inhalation of food and vomit: Secondary | ICD-10-CM

## 2024-12-02 NOTE — Patient Outreach (Addendum)
 Complex Care Management   Visit Note  12/02/2024  Name:  Lee Barnes MRN: [id number] DOB: Jan 06, 1951  Situation: Referral received for Complex Care Management related to history of Oropharyngeal Cancer; history of aspirational pneumonia; Hypertension; Neurogenic orthostatic hypotension; dysphagia with tube feedings via G/J tube; impaired gait/limited mobility with leg and arm weakness; hearing loss in both ears. I obtained verbal consent from Caregiver.  Visit completed with Caregiver/wife [name] on the phone.  Background:   Past Medical History:  Diagnosis Date   Acute aspiration pneumonia (HCC) 11/13/2022   Allergy to environmental factors    Basal cell carcinoma of left cheek 07/17/2022   Complication of anesthesia    vageled after surgery -overnite stay   Diverticulosis 2008   Heart murmur    hx of in childhood    History of chemotherapy    History of radiation therapy 11/05/10-12/26/10   r base tongue, 7000 cGy 35 sessions   Hypertension    Medication started in May 2016.   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   Tinnitus     Assessment: Patient Reported Symptoms:  Cognitive Cognitive Status: No symptoms reported (completed call with wife [name])      Neurological Neurological Review of Symptoms: Weakness, Hearing changes Neurological Management Strategies: Adequate rest, Routine screening Neurological Self-Management Outcome: 3 (uncertain) Neurological Comment: weakness to right arm/hand; hearing loss to right ear  HEENT HEENT Symptoms Reported: Change or loss of hearing HEENT Management Strategies: Medical device, Routine screening HEENT Self-Management Outcome: 3 (uncertain) HEENT Comment: Presbycusis of both ears, worse on the right    Cardiovascular Cardiovascular Symptoms Reported: Fatigue Does patient have uncontrolled Hypertension?: No Cardiovascular Management Strategies: Medication  therapy, Routine screening, Adequate rest Cardiovascular Self-Management Outcome: 3 (uncertain) Cardiovascular Comment: received recent iron  infusion; taking oral iron  supplement  Respiratory Respiratory Symptoms Reported: Productive cough, Shortness of breath Other Respiratory Symptoms: persistent productive cough; hx of oropharyngeal CA Respiratory Management Strategies: Oxygen  therapy, Medication therapy, Routine screening (Yankauer suction) Respiratory Self-Management Outcome: 3 (uncertain)  Endocrine Endocrine Symptoms Reported: No symptoms reported Is patient diabetic?: No    Gastrointestinal Gastrointestinal Symptoms Reported: Change in appetite, Diarrhea, Nausea, Reflux/heartburn, Vomiting Additional Gastrointestinal Details: s/p oropharyngeal CA; tube feedings via G/J tubes Gastrointestinal Management Strategies: Diet modification, Fluid modification, Medication therapy Gastrointestinal Self-Management Outcome: 4 (good)    Genitourinary Genitourinary Symptoms Reported: Not assessed    Integumentary Integumentary Symptoms Reported: Not assessed    Musculoskeletal Musculoskelatal Symptoms Reviewed: Weakness, Limited mobility Musculoskeletal Management Strategies: Medical device, Routine screening Musculoskeletal Self-Management Outcome: 3 (uncertain) Falls in the past year?: No Number of falls in past year: 1 or less Was there an injury with Fall?: No Fall Risk Category Calculator: 0 Patient Fall Risk Level: Low Fall Risk Patient at Risk for Falls Due to: Impaired mobility Fall risk Follow up: Falls evaluation completed  Psychosocial Psychosocial Symptoms Reported: Alteration in eating habits, Avoiding people, places, activities, Avoiding thoughts, feelings, conversations, Report of significant loss, deaths, abandonment, traumatic incidents Behavioral Management Strategies: Adequate rest, Support system Behavioral Health Self-Management Outcome: 3 (uncertain) Major  Change/Loss/Stressor/Fears (CP): Death of a loved one, Medical condition, self Techniques to Cope with Loss/Stress/Change: Withdraw (support system through wife and family) Quality of Family Relationships: involved, helpful, supportive Do you feel physically threatened by others?: No    12/02/2024    PHQ2-9 Depression Screening   Kenyah Luba interest or pleasure in doing  things    Feeling down, depressed, or hopeless    PHQ-2 - Total Score    Trouble falling or staying asleep, or sleeping too much    Feeling tired or having Dmani Mizer energy    Poor appetite or overeating     Feeling bad about yourself - or that you are a failure or have let yourself or your family down    Trouble concentrating on things, such as reading the newspaper or watching television    Moving or speaking so slowly that other people could have noticed.  Or the opposite - being so fidgety or restless that you have been moving around a lot more than usual    Thoughts that you would be better off dead, or hurting yourself in some way    PHQ2-9 Total Score    If you checked off any problems, how difficult have these problems made it for you to do your work, take care of things at home, or get along with other people    Depression Interventions/Treatment      There were no vitals filed for this visit. Pain Scale: Not given for pain  Medications Reviewed Today     Reviewed by Morgan Clayborne CROME, RN (Registered Nurse) on 12/02/24 at 0930  Med List Status: <None>   Medication Order Taking? Sig Documenting Provider Last Dose Status Informant  acetaminophen  (TYLENOL ) 325 MG tablet [id number] Yes Place 2 tablets (650 mg total) into feeding tube every 6 (six) hours as needed for mild pain (or Fever >/= 101). Will [name], MD  Active Spouse/Significant Other, Pharmacy Records           Med Note EFRAIM ALFREIDA CROME   Dju Nov 20, 2024  8:18 PM)    albuterol  (PROVENTIL ) (2.5 MG/3ML) 0.083% nebulizer solution [id number] Yes Take 3  mLs (2.5 mg total) by nebulization every 4 (four) hours as needed for wheezing or shortness of breath. [name], [name], MD  Active Spouse/Significant Other, Pharmacy Records           Med Note EFRAIM ALFREIDA CROME   Dju Nov 20, 2024  8:18 PM)    azelastine  (ASTELIN ) 0.1 % nasal spray [id number] Yes Place 1 spray into both nostrils 2 (two) times daily. Use in each nostril as directed Vita Morrow, MD  Active Spouse/Significant Other, Pharmacy Records  cetirizine (ZYRTEC) 10 MG chewable tablet [id number] Yes Place 10 mg into feeding tube daily. [provider]  Active Pharmacy Records, Spouse/Significant Other  dextromethorphan -guaiFENesin  (MAX TUSSIN DM COUGH&CHEST CONG) 10-100 MG/5ML liquid [id number] Yes Place 5 mLs into feeding tube every 6 (six) hours as needed for cough. Darci Pore, MD  Active   diphenhydrAMINE  (BENADRYL ) 25 MG tablet [id number] Yes Take 12.5 mg by mouth at bedtime.  Patient taking differently: Take 25 mg by mouth at bedtime.   [provider]  Active Pharmacy Records, Spouse/Significant Other           Med Note EFRAIM ALFREIDA CROME   Dju Nov 20, 2024  8:19 PM)    ferrous sulfate  325 (65 FE) MG EC tablet [id number] Yes Take 1 tablet (325 mg total) by mouth daily with breakfast. Darci Pore, MD  Active   fluticasone  (FLONASE ) 50 MCG/ACT nasal spray [id number] Yes SPRAY 2 SPRAYS IN EACH NOSTRIL ONCE DAILY [name], [name], MD  Active Spouse/Significant Other, Pharmacy Records  hydrALAZINE  (APRESOLINE ) 25 MG tablet [id number] Yes Place 1 tablet (25 mg total) into feeding tube every 8 (eight) hours as needed (  for SBP greater than 175). Will [name], MD  Active Spouse/Significant Other, Pharmacy Records           Med Note EFRAIM ALFREIDA CROME   Dju Nov 20, 2024  8:19 PM)    midodrine  (PROAMATINE ) 5 MG tablet [id number] Yes Take 5 mg by mouth 3 (three) times daily with meals as needed (low BP). [provider]  Active Spouse/Significant  Other, Pharmacy Records           Med Note EFRAIM ALFREIDA CROME   Dju Nov 20, 2024  8:19 PM)    Nutritional Supplements (FEEDING SUPPLEMENT, OSMOLITE 1.5 CAL,) LIQD [id number] Yes Place 1,425 mLs into feeding tube daily. Briana Elgin LABOR, MD  Active Pharmacy Records, Spouse/Significant Other           Med Note EFRAIM ALFREIDA CROME   Dju Nov 20, 2024  8:19 PM)    Nutritional Supplements [facility]) LIQD [id number] Yes Take 30 mLs by mouth daily with lunch. [provider]  Active Spouse/Significant Other, Pharmacy Records  ondansetron  (ZOFRAN ) 4 MG/5ML solution [id number] Yes Place 4 mg into feeding tube every 8 (eight) hours as needed for nausea or vomiting. [provider]  Active Spouse/Significant Other, Pharmacy Records  pantoprazole  sodium (PROTONIX ) 40 mg [id number] Yes Place 40 mg into feeding tube daily. Early, Sara E, NP  Active Spouse/Significant Other, Pharmacy Records  Saccharomyces boulardii 250 MG PACK [id number] Yes Take 0.5 each by mouth as needed. [provider]  Active Spouse/Significant Other, Pharmacy Records  Med List Note Isabel Doneta RAMAN, [place] 06/26/22 9755): Extra fluids at 1000 - Gatorade or Pedialyte            Recommendation:   Specialty provider follow-up   01/31/2025 Status: Sch   Time: 1:30 PM Length: 90  Visit Type: IR GJ TUBE CHANGE [849576] Copay: $0.00  Provider: [facility] Department: [facility]   PCP Follow-up  03/01/2025 Status: Sch   Time: 2:00 PM Length: 30  Visit Type: OFFICE VISIT [8002] Copay: $0.00  Provider: Joyce Norleen BROCKS, MD Department: [facility]    Follow Up Plan:   Telephone follow up appointment date/time  12/30/2024 Status: Sch   Time: 9:30 AM Length: 30  Visit Type: VBCI TELEPHONE CALL 30 [2502] Copay: $0.00  Provider: Morgan Clayborne CROME, RN Department: [facility]   Pharmacy Referral sent for disease management and review of best pharmacological treatment options   Clayborne Morgan RN BSN  CCM [place]  [facility], [facility] Nurse Care Coordinator  Direct Dial: [phone] Website: [email or web address]

## 2024-12-02 NOTE — Patient Instructions (Signed)
 Visit Information  Thank you for taking time to visit with me today. Please don't hesitate to contact me if I can be of assistance to you before our next scheduled appointment.  Your next care management appointment is by telephone on Thursday, February 12 at 09:30 AM  Please call the care guide team at [phone] if you need to cancel, schedule, or reschedule an appointment.   Please call 1-800-273-TALK (toll free, 24 hour hotline) if you are experiencing a Mental Health or Behavioral Health Crisis or need someone to talk to.  Clayborne Ly RN BSN CCM [place]  [facility], [facility] Nurse Care Coordinator  Direct Dial: [phone] Website: [email or web address]

## 2024-12-10 ENCOUNTER — Telehealth: Payer: Self-pay

## 2024-12-10 NOTE — Progress Notes (Signed)
 Care Guide Pharmacy Note  12/10/2024 Name: Lee Barnes MRN: [id number] DOB: 06-09-1951  Referred By: Joyce Norleen BROCKS, MD Reason for referral: Complex Care Management (Outreach to schedule referral with PharmD)   Lee Barnes is a 74 y.o. year old male who is a primary care patient of Joyce Norleen BROCKS, MD.  BURNETT SPRAY was referred to the pharmacist for assistance related to: oropharyngeal cancer   Successful contact was made with the patient to discuss pharmacy services including being ready for the pharmacist to call at least 5 minutes before the scheduled appointment time and to have medication bottles and any blood pressure readings ready for review. The patient agreed to meet with the pharmacist via telephone visit on (date/time). 12/30/24  Debbe [facility]  [facility], [facility] Guide  Direct Dial: [phone]  Fax [phone]

## 2024-12-21 ENCOUNTER — Other Ambulatory Visit: Payer: Self-pay | Admitting: Family Medicine

## 2024-12-21 DIAGNOSIS — R062 Wheezing: Secondary | ICD-10-CM

## 2024-12-30 ENCOUNTER — Telehealth

## 2024-12-30 ENCOUNTER — Other Ambulatory Visit

## 2025-01-31 ENCOUNTER — Other Ambulatory Visit (HOSPITAL_COMMUNITY)

## 2025-03-01 ENCOUNTER — Ambulatory Visit: Admitting: Family Medicine
